# Patient Record
Sex: Female | Born: 1935
Health system: Southern US, Community
[De-identification: ages and names within clinical notes are randomized; demographics above are authoritative.]

## PROBLEM LIST (undated history)

## (undated) DIAGNOSIS — I5032 Chronic diastolic (congestive) heart failure: Secondary | ICD-10-CM

## (undated) DIAGNOSIS — E876 Hypokalemia: Secondary | ICD-10-CM

## (undated) DIAGNOSIS — I4892 Unspecified atrial flutter: Principal | ICD-10-CM

## (undated) DIAGNOSIS — H269 Unspecified cataract: Secondary | ICD-10-CM

## (undated) DIAGNOSIS — I351 Nonrheumatic aortic (valve) insufficiency: Secondary | ICD-10-CM

## (undated) DIAGNOSIS — E039 Hypothyroidism, unspecified: Secondary | ICD-10-CM

## (undated) DIAGNOSIS — E7800 Pure hypercholesterolemia, unspecified: Secondary | ICD-10-CM

## (undated) DIAGNOSIS — C801 Malignant (primary) neoplasm, unspecified: Secondary | ICD-10-CM

## (undated) DIAGNOSIS — U071 COVID-19: Secondary | ICD-10-CM

## (undated) DIAGNOSIS — I4891 Unspecified atrial fibrillation: Secondary | ICD-10-CM

## (undated) DIAGNOSIS — M25562 Pain in left knee: Secondary | ICD-10-CM

## (undated) DIAGNOSIS — K449 Diaphragmatic hernia without obstruction or gangrene: Secondary | ICD-10-CM

## (undated) DIAGNOSIS — I491 Atrial premature depolarization: Secondary | ICD-10-CM

## (undated) DIAGNOSIS — I493 Ventricular premature depolarization: Secondary | ICD-10-CM

## (undated) DIAGNOSIS — F419 Anxiety disorder, unspecified: Secondary | ICD-10-CM

## (undated) DIAGNOSIS — J069 Acute upper respiratory infection, unspecified: Secondary | ICD-10-CM

## (undated) DIAGNOSIS — IMO0001 Reserved for inherently not codable concepts without codable children: Secondary | ICD-10-CM

## (undated) DIAGNOSIS — E669 Obesity, unspecified: Secondary | ICD-10-CM

## (undated) DIAGNOSIS — I10 Essential (primary) hypertension: Secondary | ICD-10-CM

## (undated) DIAGNOSIS — E559 Vitamin D deficiency, unspecified: Secondary | ICD-10-CM

## (undated) DIAGNOSIS — K219 Gastro-esophageal reflux disease without esophagitis: Secondary | ICD-10-CM

## (undated) DIAGNOSIS — N183 Chronic kidney disease, stage 3 unspecified: Secondary | ICD-10-CM

## (undated) DIAGNOSIS — M810 Age-related osteoporosis without current pathological fracture: Secondary | ICD-10-CM

## (undated) DIAGNOSIS — R609 Edema, unspecified: Secondary | ICD-10-CM

## (undated) DIAGNOSIS — E119 Type 2 diabetes mellitus without complications: Secondary | ICD-10-CM

## (undated) HISTORY — PX: COLONOSCOPY: SHX174

## (undated) HISTORY — DX: Nonrheumatic aortic (valve) insufficiency: I35.1

## (undated) HISTORY — PX: CATARACT EXTRACTION, BILATERAL: SHX1313

## (undated) HISTORY — DX: Hypokalemia: E87.6

## (undated) HISTORY — DX: Unspecified atrial flutter: I48.92

## (undated) HISTORY — DX: Acute upper respiratory infection, unspecified: J06.9

## (undated) HISTORY — DX: Unspecified atrial fibrillation: I48.91

## (undated) HISTORY — DX: Pure hypercholesterolemia, unspecified: E78.00

## (undated) HISTORY — DX: Hypothyroidism, unspecified: E03.9

## (undated) HISTORY — DX: Atrial premature depolarization: I49.1

## (undated) HISTORY — DX: Essential (primary) hypertension: I10

## (undated) HISTORY — DX: Gastro-esophageal reflux disease without esophagitis: K21.9

## (undated) HISTORY — PX: ABDOMINAL HYSTERECTOMY: SHX81

## (undated) HISTORY — DX: Chronic kidney disease, stage 3 unspecified: N18.30

## (undated) HISTORY — DX: Malignant (primary) neoplasm, unspecified: C80.1

## (undated) HISTORY — DX: Reserved for inherently not codable concepts without codable children: IMO0001

## (undated) HISTORY — DX: Anxiety disorder, unspecified: F41.9

## (undated) HISTORY — DX: Vitamin D deficiency, unspecified: E55.9

## (undated) HISTORY — DX: Pain in left knee: M25.562

## (undated) HISTORY — PX: UPPER GASTROINTESTINAL ENDOSCOPY: SHX188

## (undated) HISTORY — DX: Chronic diastolic (congestive) heart failure: I50.32

## (undated) HISTORY — DX: COVID-19: U07.1

## (undated) HISTORY — DX: Ventricular premature depolarization: I49.3

## (undated) HISTORY — DX: Obesity, unspecified: E66.9

## (undated) HISTORY — DX: Age-related osteoporosis without current pathological fracture: M81.0

## (undated) HISTORY — DX: Unspecified cataract: H26.9

## (undated) HISTORY — DX: Chronic kidney disease, stage 3 (moderate): N18.3

## (undated) HISTORY — DX: Diaphragmatic hernia without obstruction or gangrene: K44.9

## (undated) HISTORY — PX: BACK SURGERY: SHX140

## (undated) HISTORY — DX: Edema, unspecified: R60.9

## (undated) HISTORY — PX: TONSILLECTOMY: SUR1361

## (undated) HISTORY — DX: Type 2 diabetes mellitus without complications: E11.9

---

## 1978-08-18 HISTORY — PX: APPENDECTOMY: SHX54

## 1978-08-18 HISTORY — PX: TOTAL ABDOMINAL HYSTERECTOMY W/ BILATERAL SALPINGOOPHORECTOMY: SHX83

## 1998-12-17 HISTORY — PX: CHOLECYSTECTOMY: SHX55

## 2001-06-01 ENCOUNTER — Other Ambulatory Visit: Admission: RE | Admit: 2001-06-01 | Discharge: 2001-06-01 | Payer: Self-pay | Admitting: Family Medicine

## 2001-11-24 ENCOUNTER — Encounter: Payer: Self-pay | Admitting: Family Medicine

## 2001-11-24 ENCOUNTER — Ambulatory Visit (HOSPITAL_COMMUNITY): Admission: RE | Admit: 2001-11-24 | Discharge: 2001-11-24 | Payer: Self-pay | Admitting: Family Medicine

## 2002-03-24 ENCOUNTER — Ambulatory Visit (HOSPITAL_COMMUNITY): Admission: RE | Admit: 2002-03-24 | Discharge: 2002-03-24 | Payer: Self-pay

## 2002-07-12 ENCOUNTER — Other Ambulatory Visit: Admission: RE | Admit: 2002-07-12 | Discharge: 2002-08-06 | Payer: Self-pay | Admitting: Family Medicine

## 2002-07-20 ENCOUNTER — Ambulatory Visit (HOSPITAL_COMMUNITY): Admission: RE | Admit: 2002-07-20 | Discharge: 2002-07-20 | Payer: Self-pay | Admitting: Family Medicine

## 2002-07-20 ENCOUNTER — Encounter: Payer: Self-pay | Admitting: Family Medicine

## 2003-08-15 ENCOUNTER — Other Ambulatory Visit: Admission: RE | Admit: 2003-08-15 | Discharge: 2003-08-15 | Payer: Self-pay | Admitting: Family Medicine

## 2004-10-02 ENCOUNTER — Observation Stay (HOSPITAL_COMMUNITY): Admission: AD | Admit: 2004-10-02 | Discharge: 2004-10-03 | Payer: Self-pay | Admitting: Internal Medicine

## 2004-11-12 ENCOUNTER — Other Ambulatory Visit: Admission: RE | Admit: 2004-11-12 | Discharge: 2004-11-12 | Payer: Self-pay | Admitting: *Deleted

## 2005-06-06 ENCOUNTER — Ambulatory Visit (HOSPITAL_COMMUNITY): Admission: RE | Admit: 2005-06-06 | Discharge: 2005-06-06 | Payer: Self-pay | Admitting: Family Medicine

## 2005-09-17 ENCOUNTER — Ambulatory Visit: Payer: Self-pay | Admitting: Internal Medicine

## 2005-09-24 ENCOUNTER — Ambulatory Visit: Payer: Self-pay | Admitting: Internal Medicine

## 2005-10-09 ENCOUNTER — Encounter: Admission: RE | Admit: 2005-10-09 | Discharge: 2005-10-09 | Payer: Self-pay | Admitting: Specialist

## 2005-10-23 ENCOUNTER — Ambulatory Visit: Payer: Self-pay | Admitting: Internal Medicine

## 2006-01-22 ENCOUNTER — Ambulatory Visit (HOSPITAL_COMMUNITY): Admission: RE | Admit: 2006-01-22 | Discharge: 2006-01-26 | Payer: Self-pay | Admitting: Specialist

## 2009-03-20 ENCOUNTER — Encounter: Admission: RE | Admit: 2009-03-20 | Discharge: 2009-03-20 | Payer: Self-pay | Admitting: Family Medicine

## 2010-04-20 ENCOUNTER — Emergency Department (HOSPITAL_COMMUNITY): Admission: EM | Admit: 2010-04-20 | Discharge: 2010-04-21 | Payer: Self-pay | Admitting: Emergency Medicine

## 2010-04-23 ENCOUNTER — Encounter: Payer: Self-pay | Admitting: Internal Medicine

## 2010-04-23 ENCOUNTER — Telehealth: Payer: Self-pay | Admitting: Internal Medicine

## 2010-09-08 ENCOUNTER — Encounter: Payer: Self-pay | Admitting: Specialist

## 2010-09-17 NOTE — Letter (Signed)
Summary: New Patient letter  [facility]  [address] [place], [place] [address]   Phone: [phone]  Fax: [phone]       04/23/2010 MRN: [id number]  Katie [name] [address] [place], [place]  [address]  Dear Ms. Centner,  Welcome to the Gastroenterology Division at [facility].    You are scheduled to see Dr.  Leone Payor  on 05/17/10  at 9:30 a.m.  on the 3rd floor at [facility], [address]  We ask that you try to arrive at our office 15 minutes prior to your appointment time to allow for check-in.  We would like you to complete the enclosed self-administered evaluation form prior to your visit and bring it with you on the day of your appointment.  We will review it with you.  Also, please bring a complete list of all your medications or, if you prefer, bring the medication bottles and we will list them.  Please bring your insurance card so that we may make a copy of it.  If your insurance requires a referral to see a specialist, please bring your referral form from your primary care physician.  Co-payments are due at the time of your visit and may be paid by cash, check or credit card.     Your office visit will consist of a consult with your physician (includes a physical exam), any laboratory testing he/she may order, scheduling of any necessary diagnostic testing (e.g. x-ray, ultrasound, CT-scan), and scheduling of a procedure (e.g. Endoscopy, Colonoscopy) if required.  Please allow enough time on your schedule to allow for any/all of these possibilities.    If you cannot keep your appointment, please call [phone] to cancel or reschedule prior to your appointment date.  This allows [place] the opportunity to schedule an appointment for another patient in need of care.  If you do not cancel or reschedule by 5 p.m. the business day prior to your appointment date, you will be charged a $50.00 late cancellation/no-show fee.    Thank you for choosing  [facility] for your medical needs.  We appreciate the opportunity to care for you.  Please visit [place] at our website  to learn more about our practice.                     Sincerely,                                                             The Gastroenterology Division

## 2010-09-17 NOTE — Progress Notes (Signed)
Summary: triage   Phone Note Call from Patient Call back at Home Phone [phone]   Caller: Patient Call For: Dr Leone Payor Reason for Call: Talk to Nurse Summary of Call: Patient states that she has blood in her stool and was seen in the ED this weekend and was told to see her gi but the first available is on 10-24 and she wants to be seen sooner than that. Initial call taken by: Tawni Levy,  April 23, 2010 1:40 PM  Follow-up for Phone Call        Patient  was seen in the ER at [facility] on Saturday for vomiting.  She reports she also had some rectal bleeding and was advised to follow up with GI.  Patient  reports minmal bleeding x 1 today on the tissue.  She is given and appointment with Dr Leone Payor for 05/17/10 9:30.  She will call back for an increase in bleeding or other symptoms. Follow-up by: Darcey Nora RN, CGRN,  April 23, 2010 3:06 PM

## 2010-10-31 LAB — CBC
MCH: 32.8 pg (ref 26.0–34.0)
RDW: 12.5 % (ref 11.5–15.5)
WBC: 12.3 10*3/uL — ABNORMAL HIGH (ref 4.0–10.5)

## 2010-10-31 LAB — COMPREHENSIVE METABOLIC PANEL
Alkaline Phosphatase: 88 U/L (ref 39–117)
BUN: 17 mg/dL (ref 6–23)
GFR calc non Af Amer: 46 mL/min — ABNORMAL LOW (ref >60)
Glucose, Bld: 188 mg/dL — ABNORMAL HIGH (ref 70–99)
Sodium: 137 mEq/L (ref 135–145)
Total Protein: 7.2 g/dL (ref 6.0–8.3)

## 2010-10-31 LAB — DIFFERENTIAL
Basophils Absolute: 0 10*3/uL (ref 0.0–0.1)
Eosinophils Absolute: 0 10*3/uL (ref 0.0–0.7)
Eosinophils Relative: 0 % (ref 0–5)
Lymphs Abs: 1.6 10*3/uL (ref 0.7–4.0)
Neutrophils Relative %: 84 % — ABNORMAL HIGH (ref 43–77)

## 2010-10-31 LAB — TYPE AND SCREEN

## 2011-01-03 NOTE — H&P (Signed)
Katie [name], Katie [name]                 ACCOUNT NO.:  [id number]   MEDICAL RECORD NO.:  [id number]          PATIENT TYPE:  INP   LOCATION:  A225                          FACILITY:  [facility]   PHYSICIAN:  Vania Rea, M.D. DATE OF BIRTH:  Aug 05, 1932   DATE OF ADMISSION:  10/02/2004  DATE OF DISCHARGE:  LH                                HISTORY & PHYSICAL   PRIMARY CARE PHYSICIAN:  Dr. Rudi Heap.   CHIEF COMPLAINT:  Cough, sore throat, and generalized pain for the past 10  days.   HISTORY OF PRESENT ILLNESS:  This is a 77 year old Caucasian female with a  history of hypothyroidism, hypertension, GERD, hyperlipidemia, who was in  her baseline state of health until 10 days ago when she developed what  seemed like an upper respiratory problem, but has not been responding to  symptomatic treatments at her primary's office.  Yesterday, she was started  on Avelox but has not been able to tolerate it because of vomiting.  She  visited her doctor's office again yesterday because of nause and vomiting.  Blood work was repeated, and she was sent for a chest x-ray.  Blood work  revealed that she was severely hypokalemic with a serum potassium of 2.9,  and the chest x-ray reportedly revealed no evidence of acute infiltrate;  however, there is evidence of peribronchial thickening.  Physician called  the hospitalist service and requested elective admission for this patient,  who could not be managed as an outpatient.   ROS significant only for headaches, nausea and vomiting, generalized body  pain and aches, persistent coughing, soreness, sore throat, generalized  weakness. She insists she received the infloenza vaccine in October 2005,  although the is not in her records.   PAST MEDICAL HISTORY:  1.  Hypertension.  2.  Hypercholesterolemia.  3.  GERD.  4.  Obesity.  5.  Hypothyroidism.   MEDICATIONS:  1.  Zestoretic 20/12.5.  2.  Lipitor 5 mg daily.  3.  Synthroid 50 mcg daily.  4.   Tramadol 50-100 mg every 6 hours p.r.n.  5.  Nabumetone 500 mg daily p.r.n. pain.  6.  Premarin 6.25 mg daily.  7.  Prevacid 30 mg p.r.n. when she eats the wrong thing.   PAST SURGICAL HISTORY:  1.  Hysterectomy and right salpingo-oophorectomy in 1980.  2.  Appendectomy in 1980.  3.  Tonsillectomy in the remote past.  4.  Cholecystectomy in May, 2000.   ALLERGIES:  PENICILLIN causes a rash.   SOCIAL HISTORY:  Lives at home with her husband of 51 years.  Works as a  Holiday representative at [employer].  She has two grown children.  There is no history of  tobacco, alcohol, or illegal drug use.   FAMILY HISTORY:  Significant for a father who died at age 23 and mother who  died at age 35.  Father suffered with heart disease throughout most of his  life.  Mother died of a stroke.  Her siblings were plagued with  hypercholesterolemia and hypertension.  Her son has diabetes, which seems to  have been  inherited from his father.   REVIEW OF SYSTEMS:  Significant only for headaches, nausea and vomiting,  generalized body pain and aches, persistent coughing, soreness, sore throat,  generalized weakness.  The rest of the 10-point review of systems is  completely negative.   PHYSICAL EXAMINATION:  VITAL SIGNS:  Temperature 101.2, respirations 20,  blood pressure 127/66, pulse 86.  GENERAL:  This is an ill-looking, young-for-age elderly lady lying in bed.  HEENT:  Pupils are equal, round and reactive.  She is sniffling.  She seems  to have a scant nasal discharge.  Her mucous membranes are markedly dry.  Throat:  There is no exudate.  No erythema.  NECK:  She has no  lymphadenopathy.  No jugular venous distention.  CHEST:  Clear to auscultation bilaterally.  CARDIOVASCULAR:  Regular rhythm.  ABDOMEN:  Soft and nontender.  EXTREMITIES:  Without edema.  She has 3+ bounding pulses.   Her labs done yesterday are significant for hemoglobin of 13.5, white count  9.2, platelet count 249.  Absolute granulocyte  count was elevated at 8.  Her  serum chemistry was significant for a potassium of 2.9, BUN 15, creatinine  0.9.  Her sodium was 137.  Her liver function tests were unremarkable.  Her  total cholesterol was 207.  Her glucose was 110.   ASSESSMENT:  1.  Acute viral syndrome.  2.  Severe hypokalemia.  3.  Dehydration.  4.  Hypertension, controlled.  5.  History of gastroesophageal reflux disease.  6.  History of hypercholesterolemia.  7.  History of hypothyroidism.   PLAN:  We will hydrate this lady overnight and keep her n.p.o.  Start her on  clear liquids in the morning with plan to discharge her ASAP.  will do nasal swabs for influenza antigen; will do blood cultures.      LC/MEDQ  D:  10/02/2004  T:  10/02/2004  Job:  [id number]

## 2011-01-03 NOTE — Op Note (Signed)
NAMEJANAYSIA, MCLEROY                 ACCOUNT NO.:  [id number]   MEDICAL RECORD NO.:  [id number]          PATIENT TYPE:  OIB   LOCATION:  5033                         FACILITY:  [facility]   PHYSICIAN:  Kerrin Champagne, M.D.   DATE OF BIRTH:  February 14, 1932   DATE OF PROCEDURE:  01/22/2006  DATE OF DISCHARGE:                                 OPERATIVE REPORT   PREOPERATIVE DIAGNOSIS:  Lateral recess stenosis, bilateral L4-5.   POSTOPERATIVE DIAGNOSIS:  Lateral recess stenosis, bilateral L4-5.  The  patient also has degenerative disk disease, L5-S1.   OPERATION PERFORMED:  Bilateral L4-5 lateral recess decompression utilizing  the operating microscope.   SURGEON:  Kerrin Champagne, M.D.   ASSISTANT:  Wende Neighbors, P.A.   ANESTHESIA:  General orotracheal anesthesia, Dr. Diamantina Monks.   SPECIMENS:  None.   ESTIMATED BLOOD LOSS:  50 mL.   COMPLICATIONS:  None.   The patient returned to the PACU in good condition.   HISTORY OF PRESENT ILLNESS:  The patient is a 77 year old female with a  chief complaint of back pain with radiation into both legs.  The pain is  worsening as time goes by.  She has had previous epidural steroid injections  with relief of discomfort but only temporizing her pain.  She presents with  increasing symptoms of neurogenic claudication and difficulty ambulating  even 20 yards without having to find a place to sit.  She underwent  preoperative studies, myelogram which demonstrated bilateral lateral recess  stenosis at the L4-5 level, degenerative disk changes, L5-S1.  As the  patient is having significant clinical symptoms of stenosis, it is felt that  the lateral recess stenosis is the primary source of her pain and  discomfort.  She was brought to the operating room to undergo bilateral  lateral recess decompression of the L4-5 level.   INTRAOPERATIVE FINDINGS:  Bilateral lateral recess stenosis with compression  on the L5 nerve roots bilaterally affecting the 5  nerve root at its entry  point into the neural foramen along the lateral recess at the L4-5 segment.   DESCRIPTION OF PROCEDURE:  After adequate general anesthesia, the patient  knee chest position, Andrews frame, standard preoperative antibiotics with  vancomycin, her ALLERGY TO PENICILLIN.  Standard prep with Duraprep  solution.  All pressure points well padded. The patient was draped in the  usual manner.  Initial needle was placed at the expected L4 and L5 level and  the lower needle noted to be at the upper end of L5.  The upper needle at  the upper portion of L4.   The patient then had infiltration with Marcaine 0.5% with 1:200,000  epinephrine.  She had a Vi-drape then placed.  An incision made in the  midline approximately 2-1/2 inches in length through the skin and  subcutaneous layers, carried down to the patient's spinous process.  Incision made on both sides of the spinous process of L4 and L5.  Cobbs used  to elevate the paralumbar muscles both sides, exposing the L4-5 both sides.  McCullough retractor was inserted with the  blade beneath the medial border  of the longus colli muscle both sides.  Leksell rongeur then used to  carefully remove a small portion of the inferior aspect of the lamina  bilaterally at the L4 level and then  high speed bur used to remove further  bone thinning the posterior aspect of the lamina of L4 along the left side  and right side, performing semihemilaminectomies also removing a small  portion of the inferior aspect of the inferior articular process of L4 both  sides about 15 to 20%.  This was done under loupe magnification with head  lamp.  Irrigation performed using high speed bur.  The operating room  microscope draped and brought into the field sterilely, first the left side  was decompressed by removing the deeper portions of lamina of L4, removing a  small portion of the inferior lamina up to the area of insertion of the  ligamentum  flavum.  Then excising the medial 20% of the facet of L4  inferiorly medially.  Decompressing this area.  Similarly this was done on  the right side.  The ligamentum flavum was then debrided off the medial  aspect of the facet.  The superior portion of the L5 lamina off of the  ventral aspect of the L4 lamina of both sides.  All the reflected portions  of the ligamentum flavum were resected and the medial portion of the  superior articular process of L5 was then resected over about 15 to 20%  bilaterally decompressing the lateral recess and both L5 nerve roots.  Foraminotomy was then performed over the L5 nerve root using 3 mm and 2 mm  Kerrisons.  This completed, then a hockey stick nerve probe could be passed  out both L5 neural foramina without difficulty.  The medial aspect of the  pedicle was easily palpated using hockey stitch nerve probe.  Lateral  recesses were well decompressed.  Foramen for the L4 nerve root felt to be  completely open, both sides.  Bone wax was applied to the bleeding  cancellous bone surfaces on the medial aspect of the facetectomy, both  sides.  Partial facetectomies.  Excess bone wax was removed.  Thrombin  soaked Gelfoam was applied.  This was then removed at the end of the case  leaving no material within the spinal canal.  Bleeding was completely  stopped at this point.  Irrigation was performed.  Soft tissues allowed to  fall back into place.  Lumbodorsal fascia then approximated in midline with  interrupted #1 Vicryl sutures, deep subcutaneous layers approximated with  interrupted #1  and 0 Vicryl sutures, more superficial layers interrupted 2-  0 Vicryl sutures.  Skin closed with a running subcutaneous stitch of 4-0  Vicryl.  Dermabond was then applied.  4 x 4s fixed to the skin with Hypafix  tape.  The patient then returned to a supine position, reactivated, extubated and returned to recovery room in satisfactory condition.  All  instrument and sponge  counts were correct.   NECESSITY FOR AN ASSISTANT DURING THIS CASE:  The assistant was used during  this case for intraoperative retraction of neural elements, suctioning about  the neural elements on the posterior aspect of the laminectomy site, a very  delicate portion of the procedure, necessitating professional skill.  At the  end of the procedure then, the assistant assisted in closing the operative  wound.      Kerrin Champagne, M.D.  Electronically Signed  JEN/MEDQ  D:  01/22/2006  T:  01/23/2006  Job:  [id number]

## 2011-01-03 NOTE — Discharge Summary (Signed)
[name], [name]                 ACCOUNT NO.:  [id number]   MEDICAL RECORD NO.:  [id number]          PATIENT TYPE:  INP   LOCATION:  A225                          FACILITY:  [facility]   PHYSICIAN:  Vania Rea, M.D. DATE OF BIRTH:  11/03/1931   DATE OF ADMISSION:  10/02/2004  DATE OF DISCHARGE:  02/16/2006LH                                 DISCHARGE SUMMARY   PRIMARY CARE PHYSICIAN:  Ernestina Penna, M.D.   DISCHARGE DIAGNOSES:  1.  Acute viral syndrome, improved.  2.  Nausea and vomiting related to #1.  3.  Severe hypokalemia, resolved.  4.  Generalized weakness and body aches related to #1.  5.  Hypothyroidism, stable.  6.  History of gastroesophageal reflux disease.  7.  History of hyperlipidemia.   DISPOSITION:  Discharged to home.   CONDITION ON DISCHARGE:  Stable.   DISCHARGE MEDICATIONS:  1.  Synthroid 50 mcg daily.  2.  Lipitor 10 mg each evening.  3.  Premarin 0.625 mg daily.  4.  Prevacid 30 mg twice daily.  5.  Phenergan 20 mg three times daily when necessary.  6.  Tylenol 650 mg q.4h.  7.  K-Dur 20 mEq daily.  8.  Zestoretic is 20/12.5, daily, restart in three days time on Monday.   HOSPITAL COURSE:  Please refer to admission history and physical dictated  yesterday.  This is a 77 year old Caucasian lady directly admitted from her  doctor's office with a history of multiple visits to doctor's office with  what appeared to be an upper respiratory infection with a chest x-ray which  had reportedly been done at our facility showing acute bronchitis.  The  patient also, by blood work, had been found to have potassium of 2.9.  The  patient was admitted for correction of her potassium.  However, chest x-ray  could not be located.  A repeat chest x-ray done this morning was completely  clear.  No evidence of bronchitis or acute disease.  The patient's physical  exam, apart from dehydration was unremarkable.  The patient was hydrated  vigorously overnight with  potassium-containing fluid.  She was kept NPO.  This morning she was started on clear liquids and has been able to tolerate  a more solid lunch.  There is some mild nausea persisting but no vomiting.  The patient's potassium was still a little low this morning and she was  given additional oral doses of potassium.  This morning the patient's  physical examination is unremarkable.   PHYSICAL EXAMINATION:  VITAL SIGNS: Her vital signs showed temperature  100.2, respirations 18, pulse 79, blood pressure 125/66. She is saturating  at 96% on room air.  CHEST:  Clear to auscultation bilaterally.  CARDIOVASCULAR:  Regular.  ABDOMEN:  Soft, nontender.  EXTREMITIES:  Without edema.   LABORATORY DATA:  White count 5.7, hemoglobin 11.8, platelets 228.  She had  67% neutrophils, 23% lymphocytes. Sodium 139, potassium 3.1, chloride 102,  CO2 30, glucose 117, BUN 9, creatinine 1, calcium 8.3.   FOLLOW UP:  The patient is to follow up with her primary  care physician.  The patient has been advised to stay away from work until at least February  27.  She has been advised to drink plenty of soups and juices and not resume  her Zestoretic before Monday.      LC/MEDQ  D:  10/04/2004  T:  10/04/2004  Job:  [id number]

## 2011-01-09 ENCOUNTER — Encounter: Payer: Self-pay | Admitting: Nurse Practitioner

## 2012-05-07 ENCOUNTER — Encounter: Payer: Self-pay | Admitting: Internal Medicine

## 2012-05-07 ENCOUNTER — Ambulatory Visit (INDEPENDENT_AMBULATORY_CARE_PROVIDER_SITE_OTHER): Payer: Medicare Other | Admitting: Internal Medicine

## 2012-05-07 VITALS — BP 160/80 | HR 68 | Ht 67.75 in | Wt 188.0 lb

## 2012-05-07 DIAGNOSIS — E119 Type 2 diabetes mellitus without complications: Secondary | ICD-10-CM | POA: Insufficient documentation

## 2012-05-07 DIAGNOSIS — I10 Essential (primary) hypertension: Secondary | ICD-10-CM | POA: Insufficient documentation

## 2012-05-07 DIAGNOSIS — R198 Other specified symptoms and signs involving the digestive system and abdomen: Secondary | ICD-10-CM

## 2012-05-07 DIAGNOSIS — E785 Hyperlipidemia, unspecified: Secondary | ICD-10-CM | POA: Insufficient documentation

## 2012-05-07 DIAGNOSIS — E1169 Type 2 diabetes mellitus with other specified complication: Secondary | ICD-10-CM | POA: Insufficient documentation

## 2012-05-07 DIAGNOSIS — K5900 Constipation, unspecified: Secondary | ICD-10-CM

## 2012-05-07 DIAGNOSIS — K219 Gastro-esophageal reflux disease without esophagitis: Secondary | ICD-10-CM | POA: Insufficient documentation

## 2012-05-07 DIAGNOSIS — R194 Change in bowel habit: Secondary | ICD-10-CM

## 2012-05-07 MED ORDER — MOVIPREP 100 G PO SOLR: 1.0000 | Freq: Once | ORAL | Status: DC

## 2012-05-07 NOTE — Addendum Note (Signed)
Addended by: Jeanine Luz on: 05/07/2012 10:59 AM   Modules accepted: Orders

## 2012-05-07 NOTE — Progress Notes (Signed)
  Subjective:    Patient ID: Katie [name], female    DOB: 06-Aug-1932, 78 y.o.   MRN: [id number]  HPI This is a very pleasant married 78 year old white woman known from prior colonoscopy in 2007, it showed diverticulosis and mixed hemorrhoids. Over the past year she has had worsening constipation and has noted progressively smaller caliber of stools. She has not noted rectal bleeding she has some mild lower abdominal discomfort with this. She has tried stool softener and some over-the-counter laxative tablets by [employer] with minimal relief. She does not move her bowels except every few days, and does have the caliber change. She is concerned because her sister has ovarian and colon cancer, presumably metastatic ovarian cancer, another as endometrial cancer. They both had abdominal complaints prior to their diagnosis. The patient takes a PPI for heartburn and GERD with good results. She previously had an EGD in 2007 that was normal.  Medications, allergies, past medical history, past surgical history, family history and social history are reviewed and updated in the EMR.   Review of Systems This is positive for some urinary frequency and low back pain, evaluated yesterday at the family medicine office, a negative urinalysis at that time. She has some joint pains. He recently had a Pap smear. He did have a ring culture with mixed flora present in August. All other review of systems are negative except as per history of present illness at this point.    Objective:   Physical Exam General:  Well-developed, well-nourished and in no acute distress Eyes:  anicteric. ENT:   Mouth and posterior pharynx free of lesions.  Neck:   supple w/o thyromegaly or mass.  Lungs: Clear to auscultation bilaterally. Heart:  S1S2, no rubs, murmurs, gallops. Abdomen:  soft, non-tender, no hepatosplenomegaly, hernia, or mass and BS+.  Rectal: Deferred until colonoscopy Lymph:  no cervical or supraclavicular  adenopathy. Extremities:   no edema Skin   no rash. Neuro:  A&O x 3.  Psych:  appropriate mood and  Affect.   Data Reviewed: 04/01/2012 metabolic panel showing creatinine slightly elevated at 1.14 and glucose 141. TSH was slightly elevated at 4.613.     Assessment & Plan:   1. Change in bowel habits   2. Constipation    1. Will schedule colonoscopy to evaluate a change in bowel habits. 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 The risks and benefits as well as alternatives of endoscopic procedure(s) have been discussed and reviewed. All questions answered. The patient agrees to proceed. She will start MiraLax on a daily basis to help with constipation Or other plans pending these results.   I appreciate the opportunity to care for this patient.  CC: Rudi Heap, MD

## 2012-05-07 NOTE — Patient Instructions (Addendum)
You have been scheduled for a colonoscopy with propofol. Please follow written instructions given to you at your visit today.  Please pick up your prep kit at the pharmacy within the next 1-3 days. If you use inhalers (even only as needed), please bring them with you on the day of your procedure.  You may take a dose of Miralax daily for constipation

## 2012-05-10 ENCOUNTER — Encounter: Payer: Self-pay | Admitting: Internal Medicine

## 2012-05-11 ENCOUNTER — Encounter: Payer: Self-pay | Admitting: Internal Medicine

## 2012-05-28 ENCOUNTER — Encounter: Payer: Self-pay | Admitting: Internal Medicine

## 2012-05-28 ENCOUNTER — Ambulatory Visit (AMBULATORY_SURGERY_CENTER): Payer: Medicare Other | Admitting: Internal Medicine

## 2012-05-28 VITALS — BP 148/76 | HR 61 | Temp 98.0°F | Resp 15 | Ht 68.0 in | Wt 188.0 lb

## 2012-05-28 DIAGNOSIS — K5730 Diverticulosis of large intestine without perforation or abscess without bleeding: Secondary | ICD-10-CM

## 2012-05-28 DIAGNOSIS — K648 Other hemorrhoids: Secondary | ICD-10-CM

## 2012-05-28 DIAGNOSIS — D126 Benign neoplasm of colon, unspecified: Secondary | ICD-10-CM

## 2012-05-28 DIAGNOSIS — K5900 Constipation, unspecified: Secondary | ICD-10-CM

## 2012-05-28 DIAGNOSIS — R198 Other specified symptoms and signs involving the digestive system and abdomen: Secondary | ICD-10-CM

## 2012-05-28 MED ORDER — SODIUM CHLORIDE 0.9 % IV SOLN: 500.0000 mL | INTRAVENOUS | Status: DC

## 2012-05-28 NOTE — Op Note (Signed)
[facility] [address] [place], [address]   COLONOSCOPY PROCEDURE REPORT  PATIENT: Katie [name], Katie [name]  MR#: [id number] BIRTHDATE: 1931/09/10 , 80  yrs. old GENDER: Female ENDOSCOPIST: Iva Boop, MD, [facility] [place] REFERRED BY: PROCEDURE DATE:  05/28/2012 PROCEDURE:   Colonoscopy with snare polypectomy ASA CLASS:   Class II INDICATIONS:change in bowel habits. MEDICATIONS: propofol (Diprivan) 150mg  IV, MAC sedation, administered by CRNA, and These medications were titrated to patient response per physician's verbal order  DESCRIPTION OF PROCEDURE:   After the risks benefits and alternatives of the procedure were thoroughly explained, informed consent was obtained.  A digital rectal exam revealed no abnormalities of the rectum.   The LB CF-[id number]  endoscope was introduced through the anus and advanced to the cecum, which was identified by both the appendix and ileocecal valve. No adverse events experienced.   The quality of the prep was Suprep excellent The instrument was then slowly withdrawn as the colon was fully examined.      COLON FINDINGS: Two diminutive polypoid shaped sessile polyps were found at the cecum.  A polypectomy was performed with a cold snare. The resection was complete and the polyp tissue was completely retrieved.   Moderate diverticulosis was noted in the sigmoid colon.   Moderate sized internal hemorrhoids were found.   The colon mucosa was otherwise normal.  Retroflexed views revealed internal hemorrhoids. The time to cecum=3 minutes 11 seconds. Withdrawal time=10 minutes 30 seconds.  The scope was withdrawn and the procedure completed. COMPLICATIONS: There were no complications.  ENDOSCOPIC IMPRESSION: 1.   Two diminutive sessile polyps were found at the cecum; polypectomy was performed with a cold snare 2.   Moderate diverticulosis was noted in the sigmoid colon 3.   Moderate sized internal hemorrhoids 4.   The colon mucosa  was otherwise normal with excellent prep  RECOMMENDATIONS: Continue MiraLax  eSigned:  Iva Boop, MD, [facility] 05/28/2012 2:10 PM  cc: Rudi Heap, MD and The Patient

## 2012-05-28 NOTE — Patient Instructions (Addendum)
Two very small polyps were removed. You have diverticulosis which likely caused the change in bowels. Hemorrhoids also seen.  Please continue the MiraLax and see me as needed.  Thank you for choosing me and [facility].  Iva Boop, MD, FACG   YOU HAD AN ENDOSCOPIC PROCEDURE TODAY AT THE  ENDOSCOPY CENTER: Refer to the procedure report that was given to you for any specific questions about what was found during the examination.  If the procedure report does not answer your questions, please call your gastroenterologist to clarify.  If you requested that your care partner not be given the details of your procedure findings, then the procedure report has been included in a sealed envelope for you to review at your convenience later.  YOU SHOULD EXPECT: Some feelings of bloating in the abdomen. Passage of more gas than usual.  Walking can help get rid of the air that was put into your GI tract during the procedure and reduce the bloating. If you had a lower endoscopy (such as a colonoscopy or flexible sigmoidoscopy) you may notice spotting of blood in your stool or on the toilet paper. If you underwent a bowel prep for your procedure, then you may not have a normal bowel movement for a few days.  DIET: Your first meal following the procedure should be a light meal and then it is ok to progress to your normal diet.  A half-sandwich or bowl of soup is an example of a good first meal.  Heavy or fried foods are harder to digest and may make you feel nauseous or bloated.  Likewise meals heavy in dairy and vegetables can cause extra gas to form and this can also increase the bloating.  Drink plenty of fluids but you should avoid alcoholic beverages for 24 hours.  ACTIVITY: Your care partner should take you home directly after the procedure.  You should plan to take it easy, moving slowly for the rest of the day.  You can resume normal activity the day after the procedure however you  should NOT DRIVE or use heavy machinery for 24 hours (because of the sedation medicines used during the test).    SYMPTOMS TO REPORT IMMEDIATELY: A gastroenterologist can be reached at any hour.  During normal business hours, 8:30 AM to 5:00 PM Monday through Friday, call [phone].  After hours and on weekends, please call the GI answering service at [phone] who will take a message and have the physician on call contact you.   Following lower endoscopy (colonoscopy or flexible sigmoidoscopy):  Excessive amounts of blood in the stool  Significant tenderness or worsening of abdominal pains  Swelling of the abdomen that is new, acute  Fever of 100F or higher  FOLLOW UP: If any biopsies were taken you will be contacted by phone or by letter within the next 1-3 weeks.  Call your gastroenterologist if you have not heard about the biopsies in 3 weeks.  Our staff will call the home number listed on your records the next business day following your procedure to check on you and address any questions or concerns that you may have at that time regarding the information given to you following your procedure. This is a courtesy call and so if there is no answer at the home number and we have not heard from you through the emergency physician on call, we will assume that you have returned to your regular daily activities without incident.  SIGNATURES/CONFIDENTIALITY: You and/or  your care partner have signed paperwork which will be entered into your electronic medical record.  These signatures attest to the fact that that the information above on your After Visit Summary has been reviewed and is understood.  Full responsibility of the confidentiality of this discharge information lies with you and/or your care-partner.    Handouts on [polyps, hemorrhoids, diverticulosis, high fiber diet

## 2012-05-28 NOTE — Progress Notes (Signed)
The pt tolerated the colonoscopy very well. Maw   

## 2012-05-28 NOTE — Progress Notes (Signed)
Patient did not experience any of the following events: a burn prior to discharge; a fall within the facility; wrong site/side/patient/procedure/implant event; or a hospital transfer or hospital admission upon discharge from the facility. (G8907) Patient did not have preoperative order for IV antibiotic SSI prophylaxis. (G8918)  

## 2012-05-31 ENCOUNTER — Telehealth: Payer: Self-pay | Admitting: *Deleted

## 2012-05-31 NOTE — Telephone Encounter (Signed)
  Follow up Call-  Call back number 05/28/2012  Post procedure Call Back phone  # [phone]  Permission to leave phone message Yes     Patient questions:  Do you have a fever, pain , or abdominal swelling? no Pain Score  0 *  Have you tolerated food without any problems? yes  Have you been able to return to your normal activities? yes  Do you have any questions about your discharge instructions: Diet   no Medications  no Follow up visit  no  Do you have questions or concerns about your Care? no  Actions: * If pain score is 4 or above: No action needed, pain <4.

## 2012-06-03 ENCOUNTER — Encounter: Payer: Self-pay | Admitting: Internal Medicine

## 2012-06-03 NOTE — Progress Notes (Signed)
Quick Note:  2 diminutive serrated adenomas No automatic recall at her age ______

## 2012-06-12 ENCOUNTER — Encounter (HOSPITAL_COMMUNITY): Payer: Self-pay | Admitting: Pharmacist

## 2012-06-18 ENCOUNTER — Other Ambulatory Visit (HOSPITAL_COMMUNITY): Payer: Medicare Other

## 2012-06-25 ENCOUNTER — Ambulatory Visit (HOSPITAL_COMMUNITY)
Admission: RE | Admit: 2012-06-25 | Payer: Medicare Other | Source: Ambulatory Visit | Admitting: Obstetrics & Gynecology

## 2012-06-25 ENCOUNTER — Encounter (HOSPITAL_COMMUNITY): Admission: RE | Payer: Self-pay | Source: Ambulatory Visit

## 2012-06-25 SURGERY — ROBOTIC ASSISTED LAPAROSCOPIC SACROCOLPOPEXY
Anesthesia: General

## 2012-11-29 ENCOUNTER — Telehealth: Payer: Self-pay | Admitting: Nurse Practitioner

## 2012-11-29 NOTE — Telephone Encounter (Signed)
Advised patient that no appointments available. Told to bring in urine specimen and we would check. Patient states that she will bring in tomorrow am

## 2012-11-30 ENCOUNTER — Other Ambulatory Visit (INDEPENDENT_AMBULATORY_CARE_PROVIDER_SITE_OTHER): Payer: Medicare Other

## 2012-11-30 DIAGNOSIS — N390 Urinary tract infection, site not specified: Secondary | ICD-10-CM

## 2012-11-30 LAB — POCT UA - MICROSCOPIC ONLY: Mucus, UA: NEGATIVE

## 2012-11-30 LAB — POCT URINALYSIS DIPSTICK: Spec Grav, UA: 1.01

## 2012-12-01 ENCOUNTER — Other Ambulatory Visit: Payer: Self-pay | Admitting: Nurse Practitioner

## 2012-12-01 ENCOUNTER — Telehealth: Payer: Self-pay | Admitting: Nurse Practitioner

## 2012-12-01 MED ORDER — CIPROFLOXACIN HCL 500 MG PO TABS: 500.0000 mg | ORAL_TABLET | Freq: Two times a day (BID) | ORAL | Status: DC

## 2012-12-01 NOTE — Telephone Encounter (Signed)
Please advise 

## 2012-12-01 NOTE — Telephone Encounter (Signed)
Patient called back questioning status. I advised that we were waiting on a response from [facility]. 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 I advised that it would probably be closer to the end of the day before she hears back but we are working on it. Patient was ok with that.

## 2012-12-14 ENCOUNTER — Encounter: Payer: Self-pay | Admitting: *Deleted

## 2013-01-04 ENCOUNTER — Ambulatory Visit (INDEPENDENT_AMBULATORY_CARE_PROVIDER_SITE_OTHER): Payer: Medicare Other | Admitting: Nurse Practitioner

## 2013-01-04 ENCOUNTER — Encounter: Payer: Self-pay | Admitting: Nurse Practitioner

## 2013-01-04 VITALS — BP 158/70 | HR 72 | Temp 97.0°F | Ht 67.0 in | Wt 192.0 lb

## 2013-01-04 DIAGNOSIS — R609 Edema, unspecified: Secondary | ICD-10-CM

## 2013-01-04 DIAGNOSIS — E039 Hypothyroidism, unspecified: Secondary | ICD-10-CM

## 2013-01-04 DIAGNOSIS — E876 Hypokalemia: Secondary | ICD-10-CM

## 2013-01-04 DIAGNOSIS — M549 Dorsalgia, unspecified: Secondary | ICD-10-CM

## 2013-01-04 DIAGNOSIS — E559 Vitamin D deficiency, unspecified: Secondary | ICD-10-CM

## 2013-01-04 DIAGNOSIS — K5900 Constipation, unspecified: Secondary | ICD-10-CM | POA: Insufficient documentation

## 2013-01-04 DIAGNOSIS — I10 Essential (primary) hypertension: Secondary | ICD-10-CM

## 2013-01-04 DIAGNOSIS — K219 Gastro-esophageal reflux disease without esophagitis: Secondary | ICD-10-CM

## 2013-01-04 DIAGNOSIS — E119 Type 2 diabetes mellitus without complications: Secondary | ICD-10-CM

## 2013-01-04 DIAGNOSIS — E785 Hyperlipidemia, unspecified: Secondary | ICD-10-CM

## 2013-01-04 LAB — COMPLETE METABOLIC PANEL WITH GFR
AST: 20 U/L (ref 0–37)
Albumin: 4.5 g/dL (ref 3.5–5.2)
BUN: 16 mg/dL (ref 6–23)
CO2: 29 mEq/L (ref 19–32)
Calcium: 9.8 mg/dL (ref 8.4–10.5)
Chloride: 101 mEq/L (ref 96–112)
GFR, Est African American: 49 mL/min — ABNORMAL LOW
GFR, Est Non African American: 42 mL/min — ABNORMAL LOW
Glucose, Bld: 175 mg/dL — ABNORMAL HIGH (ref 70–99)
Potassium: 4.2 mEq/L (ref 3.5–5.3)

## 2013-01-04 MED ORDER — LOVASTATIN 40 MG PO TABS: 40.0000 mg | ORAL_TABLET | Freq: Every day | ORAL | Status: DC

## 2013-01-04 MED ORDER — AMLODIPINE BESYLATE 10 MG PO TABS: 10.0000 mg | ORAL_TABLET | Freq: Every day | ORAL | Status: DC

## 2013-01-04 MED ORDER — TRAMADOL HCL 50 MG PO TABS: 50.0000 mg | ORAL_TABLET | Freq: Every day | ORAL | Status: DC

## 2013-01-04 MED ORDER — FUROSEMIDE 40 MG PO TABS: 40.0000 mg | ORAL_TABLET | Freq: Every day | ORAL | Status: DC

## 2013-01-04 MED ORDER — BENAZEPRIL HCL 20 MG PO TABS: 20.0000 mg | ORAL_TABLET | Freq: Every day | ORAL | Status: DC

## 2013-01-04 MED ORDER — LINACLOTIDE 145 MCG PO CAPS: 145.0000 ug | ORAL_CAPSULE | Freq: Every day | ORAL | Status: DC

## 2013-01-04 MED ORDER — LEVOTHYROXINE SODIUM 50 MCG PO TABS: 50.0000 ug | ORAL_TABLET | Freq: Every day | ORAL | Status: DC

## 2013-01-04 MED ORDER — OMEPRAZOLE 40 MG PO CPDR: 40.0000 mg | DELAYED_RELEASE_CAPSULE | Freq: Every day | ORAL | Status: DC

## 2013-01-04 MED ORDER — POTASSIUM CHLORIDE CRYS ER 20 MEQ PO TBCR: 20.0000 meq | EXTENDED_RELEASE_TABLET | Freq: Every day | ORAL | Status: DC

## 2013-01-04 NOTE — Patient Instructions (Signed)

## 2013-01-04 NOTE — Progress Notes (Signed)
Subjective:    Patient ID: Katie [name], female    DOB: 09-05-1931, 79 y.o.   MRN: [id number]  Hypertension This is a chronic problem. The current episode started more than 1 year ago. The problem is unchanged. The problem is controlled (patient didn't take her blood pressure meds this AM.). Pertinent negatives include no chest pain, headaches, malaise/fatigue, neck pain, palpitations, peripheral edema or shortness of breath. There are no associated agents to hypertension. Risk factors for coronary artery disease include dyslipidemia, obesity and post-menopausal state. Past treatments include ACE inhibitors, calcium channel blockers and diuretics. The current treatment provides significant improvement. Compliance problems include exercise and diet.  Hypertensive end-organ damage includes a thyroid problem.  Hyperlipidemia This is a chronic problem. The current episode started more than 1 year ago. The problem is controlled. Recent lipid tests were reviewed and are normal. Exacerbating diseases include diabetes, hypothyroidism and obesity. Pertinent negatives include no chest pain, leg pain, myalgias or shortness of breath. Current antihyperlipidemic treatment includes statins. The current treatment provides moderate improvement of lipids. Compliance problems include adherence to diet and adherence to exercise.  Risk factors for coronary artery disease include diabetes mellitus, hypertension, obesity and post-menopausal.  Diabetes She presents for her follow-up diabetic visit. She has type 2 diabetes mellitus. No MedicAlert identification noted. The initial diagnosis of diabetes was made 5 years ago. Her disease course has been stable. There are no hypoglycemic associated symptoms. Pertinent negatives for hypoglycemia include no headaches. There are no diabetic associated symptoms. Pertinent negatives for diabetes include no chest pain, no polydipsia, no polyphagia, no polyuria, no visual change, no  weakness and no weight loss. There are no hypoglycemic complications. Symptoms are stable. There are no diabetic complications. Risk factors for coronary artery disease include hypertension, obesity and post-menopausal. Current diabetic treatment includes diet. She is compliant with treatment most of the time. Her weight is stable. She is following a diabetic diet. When asked about meal planning, she reported none. She has not had a previous visit with a dietician. She rarely participates in exercise. There is no change in her home blood glucose trend. (Patent hasn't been checking blood sugar at home.) An ACE inhibitor/angiotensin II receptor blocker is being taken. She does not see a podiatrist.Eye exam is not current (2011- patient told needs appointment.).  Thyroid Problem Presents for follow-up visit. Symptoms include anxiety (at times). Patient reports no depressed mood, heat intolerance, leg swelling, palpitations, visual change or weight loss. The symptoms have been stable. Her past medical history is significant for diabetes and hyperlipidemia.      Review of Systems  Constitutional: Negative for weight loss and malaise/fatigue.  HENT: Negative for neck pain.   Respiratory: Negative for shortness of breath.   Cardiovascular: Negative for chest pain and palpitations.  Endocrine: Negative for heat intolerance, polydipsia, polyphagia and polyuria.  Musculoskeletal: Negative for myalgias.  Neurological: Negative for weakness and headaches.  All other systems reviewed and are negative.       Objective:   Physical Exam  Constitutional: She is oriented to person, place, and time. She appears well-developed and well-nourished.  HENT:  Nose: Nose normal.  Mouth/Throat: Oropharynx is clear and moist.  Eyes: EOM are normal.  Neck: Trachea normal, normal range of motion and full passive range of motion without pain. Neck supple. No JVD present. Carotid bruit is not present. No thyromegaly  present.  Cardiovascular: Normal rate, regular rhythm, normal heart sounds and intact distal pulses.  Exam reveals no gallop and  no friction rub.   No murmur heard. Pulmonary/Chest: Effort normal and breath sounds normal.  Abdominal: Soft. Bowel sounds are normal. She exhibits no distension and no mass. There is no tenderness.  Musculoskeletal: Normal range of motion.  Lymphadenopathy:    She has no cervical adenopathy.  Neurological: She is alert and oriented to person, place, and time. She has normal reflexes.  Skin: Skin is warm and dry.  Psychiatric: She has a normal mood and affect. Her behavior is normal. Judgment and thought content normal.   BP 158/70  Pulse 72  Temp(Src) 97 F (36.1 C) (Oral)  Ht 5\' 7"  (1.702 m)  Wt 192 lb (87.091 kg)  BMI 30.06 kg/m2 See diabetic foot exam.  Results for orders placed in visit on 01/04/13  POCT GLYCOSYLATED HEMOGLOBIN (HGB A1C)      Result Value Range   Hemoglobin A1C 6.7 %          Assessment & Plan:  1. Diabetes mellitus type 2, diet-controlled Continue diabetic low carb diet - POCT glycosylated hemoglobin (Hb A1C)  2. Hypertension Low NA+ diet  - COMPLETE METABOLIC PANEL WITH GFR - NMR Lipoprofile with Lipids - amLODipine (NORVASC) 10 MG tablet; Take 1 tablet (10 mg total) by mouth daily.  Dispense: 90 tablet; Refill: 1 - benazepril (LOTENSIN) 20 MG tablet; Take 1 tablet (20 mg total) by mouth daily.  Dispense: 90 tablet; Refill: 1  3. Hyperlipidemia Low aft diet and exercise encouraged - COMPLETE METABOLIC PANEL WITH GFR - NMR Lipoprofile with Lipids - lovastatin (MEVACOR) 40 MG tablet; Take 1 tablet (40 mg total) by mouth at bedtime. Take 2 tablets by mouth at bedtime daily  Dispense: 180 tablet; Refill: 1  4. Unspecified vitamin D deficiency - Vitamin D 25 hydroxy  5. Constipation Force fluids Increase fiber in diet - Linaclotide (LINZESS) 145 MCG CAPS; Take 1 capsule (145 mcg total) by mouth daily.  Dispense: 90  capsule; Refill: 1  6. Hypokalemia Let me know if you develop lower ext cramping - potassium chloride SA (KLOR-CON M20) 20 MEQ tablet; Take 1 tablet (20 mEq total) by mouth daily.  Dispense: 90 tablet; Refill: 1  7. Hypothyroidism - levothyroxine (SYNTHROID, LEVOTHROID) 50 MCG tablet; Take 1 tablet (50 mcg total) by mouth daily before breakfast.  Dispense: 90 tablet; Refill: 1  8. GERD (gastroesophageal reflux disease) Fatty and spicy foods can aggravate GERD - omeprazole (PRILOSEC) 40 MG capsule; Take 1 capsule (40 mg total) by mouth daily.  Dispense: 90 capsule; Refill: 1  9. Back pain - traMADol (ULTRAM) 50 MG tablet; Take 1 tablet (50 mg total) by mouth daily.  Dispense: 90 tablet; Refill: 1  10. Peripheral edema Elevate legs when sitting - furosemide (LASIX) 40 MG tablet; Take 1 tablet (40 mg total) by mouth daily.  Dispense: 90 tablet; Refill: 1  Mary-Margaret Daphine Deutscher, FNP

## 2013-01-07 ENCOUNTER — Ambulatory Visit: Payer: Self-pay | Admitting: Nurse Practitioner

## 2013-01-07 LAB — NMR LIPOPROFILE WITH LIPIDS
Cholesterol, Total: 158 mg/dL (ref <200)
LDL Particle Number: 1214 nmol/L — ABNORMAL HIGH (ref <1000)
Large VLDL-P: 5.7 nmol/L — ABNORMAL HIGH (ref <=2.7)
Small LDL Particle Number: 718 nmol/L — ABNORMAL HIGH (ref <=527)
VLDL Size: 48.1 nm — ABNORMAL HIGH (ref <=46.6)

## 2013-02-03 ENCOUNTER — Telehealth: Payer: Self-pay | Admitting: Nurse Practitioner

## 2013-02-07 NOTE — Telephone Encounter (Signed)
Correct directions

## 2013-02-07 NOTE — Telephone Encounter (Signed)
Called in the new rx for lovastatin 40mg  one daily, qty 90  With one refill.

## 2013-02-07 NOTE — Telephone Encounter (Signed)
Should be 1 PO QD

## 2013-02-08 NOTE — Telephone Encounter (Signed)
Done 02/08/13

## 2013-02-11 ENCOUNTER — Telehealth: Payer: Self-pay | Admitting: Nurse Practitioner

## 2013-02-16 NOTE — Telephone Encounter (Signed)
done

## 2013-02-28 ENCOUNTER — Telehealth: Payer: Self-pay | Admitting: Nurse Practitioner

## 2013-02-28 DIAGNOSIS — E785 Hyperlipidemia, unspecified: Secondary | ICD-10-CM

## 2013-02-28 MED ORDER — LOVASTATIN 40 MG PO TABS: ORAL_TABLET | ORAL | Status: DC

## 2013-02-28 NOTE — Telephone Encounter (Signed)
rx ready for pickup 

## 2013-02-28 NOTE — Telephone Encounter (Signed)
Patient notified

## 2013-03-09 ENCOUNTER — Ambulatory Visit (INDEPENDENT_AMBULATORY_CARE_PROVIDER_SITE_OTHER): Payer: Medicare Other | Admitting: Pharmacist

## 2013-03-09 ENCOUNTER — Ambulatory Visit (INDEPENDENT_AMBULATORY_CARE_PROVIDER_SITE_OTHER): Payer: Medicare Other

## 2013-03-09 ENCOUNTER — Encounter: Payer: Self-pay | Admitting: Pharmacist

## 2013-03-09 VITALS — Ht 67.5 in | Wt 192.0 lb

## 2013-03-09 DIAGNOSIS — M8580 Other specified disorders of bone density and structure, unspecified site: Secondary | ICD-10-CM

## 2013-03-09 DIAGNOSIS — M899 Disorder of bone, unspecified: Secondary | ICD-10-CM

## 2013-03-09 DIAGNOSIS — M949 Disorder of cartilage, unspecified: Secondary | ICD-10-CM

## 2013-03-09 DIAGNOSIS — M549 Dorsalgia, unspecified: Secondary | ICD-10-CM

## 2013-03-09 NOTE — Patient Instructions (Addendum)

## 2013-03-09 NOTE — Progress Notes (Signed)
Patient ID: Katie [name], female   DOB: 1932-05-30, 79 y.o.   MRN: [id number] [facility] Current Height: Height: 5' 7.5" (171.5 cm)      Max Lifetime Height:  5'8" Current Weight: Weight: 192 lb (87.091 kg)       Ethnicity:Caucasian        HPI: Does pt already have a diagnosis of:  Osteopenia?  No Osteoporosis?  No  Back Pain?  Yes - back surgery 2007 by Dr. Otelia Sergeant    Kyphosis?  No Prior fracture?  No Med(s) for Osteoporosis/Osteopenia:  none Med(s) previously tried for Osteoporosis/Osteopenia:  none                                                             PMH: Age at menopause:  Surgical 1980 Hysterectomy?  Yes Oophorectomy?  Yes - 1 ovary removed/1 ovary remians HRT? Yes - Former.  Type/duration: premain for several years Steroid Use?  No Thyroid med?  yes History of cancer?  No History of digestive disorders (ie Crohn's)?  Yes- takes PPI for GERD Current or previous eating disorders?  No Last Vitamin D Result:  46 (01/04/2013) Last GFR Result:  42 (01/04/2013)   FH/SH: Family history of osteoporosis?  Yes - possibly maternal grandmother Parent with history of hip fracture?  No Family history of breast cancer?  No Exercise?  Yes - 3 times weekly at [facility] Smoking?  No Alcohol?  No    Calcium Assessment Calcium Intake  # of servings/day  Calcium mg  Milk (8 oz) 0  x  300  = 0  Yogurt (4 oz) 0 x  200 = 0  Cheese (1 oz) 1/2 x  200 = 100mg   Other Calcium sources   250mg   Ca supplement 600mg  bid = 1200mg    Estimated calcium intake per day 1550mg     DEXA Results Date of Test T-Score for AP Spine L1-L4 T-Score for Total Left Hip T-Score for Total Right Hip  03/09/2013 1.1 -0.9 -0.6  01/09/2010 0.9 -0.2 -0.2  09/07/2006 1.0 0.2 0.0  07/18/2002 1.2 0.5 --   **T-Score for neck of left femur was -1.2 on 03/09/2013 **T-Score for neck for right femur was -1.1 on 03/09/2013  FRAX 10 year estimate:  Total FX risk:  12%  (consider medication if >/= 20%) Hip FX  risk:  2.3%  (consider medication if >/= 3%)  Assessment: Osteopenia with decrease in BMD  Recommendations: 1.  Discussed results of DEXA and risk of fracture.  Discussed Evista and bisphosophonates - patient declined to start medication at this time 2.  continue calcium 1200mg  daily through supplementation or diet.  3.  continue weight bearing exercise - 30 minutes at least 4 days  per week.   4.  Counseled and educated about fall risk and prevention.  Recheck DEXA:  2 years  Time spent counseling patient:  30 minutes

## 2013-04-08 ENCOUNTER — Encounter: Payer: Self-pay | Admitting: Nurse Practitioner

## 2013-04-08 ENCOUNTER — Ambulatory Visit (INDEPENDENT_AMBULATORY_CARE_PROVIDER_SITE_OTHER): Payer: Medicare Other | Admitting: Nurse Practitioner

## 2013-04-08 VITALS — BP 149/79 | HR 63 | Temp 98.2°F | Ht 67.5 in | Wt 188.5 lb

## 2013-04-08 DIAGNOSIS — IMO0001 Reserved for inherently not codable concepts without codable children: Secondary | ICD-10-CM

## 2013-04-08 DIAGNOSIS — E1165 Type 2 diabetes mellitus with hyperglycemia: Secondary | ICD-10-CM

## 2013-04-08 DIAGNOSIS — E785 Hyperlipidemia, unspecified: Secondary | ICD-10-CM

## 2013-04-08 DIAGNOSIS — IMO0002 Reserved for concepts with insufficient information to code with codable children: Secondary | ICD-10-CM

## 2013-04-08 DIAGNOSIS — I10 Essential (primary) hypertension: Secondary | ICD-10-CM

## 2013-04-08 DIAGNOSIS — E039 Hypothyroidism, unspecified: Secondary | ICD-10-CM

## 2013-04-08 LAB — POCT GLYCOSYLATED HEMOGLOBIN (HGB A1C): Hemoglobin A1C: 6.8

## 2013-04-08 NOTE — Progress Notes (Signed)
Subjective:    Patient ID: Katie [name], female    DOB: Jul 22, 1932, 79 y.o.   MRN: [id number]  Hypertension This is a chronic problem. The current episode started more than 1 year ago. The problem is unchanged. The problem is controlled (patient didn't take her blood pressure meds this AM.). Pertinent negatives include no chest pain, headaches, malaise/fatigue, neck pain, palpitations, peripheral edema or shortness of breath. There are no associated agents to hypertension. Risk factors for coronary artery disease include dyslipidemia, obesity and post-menopausal state. Past treatments include ACE inhibitors, calcium channel blockers and diuretics. The current treatment provides significant improvement. Compliance problems include exercise and diet.  Hypertensive end-organ damage includes a thyroid problem.  Hyperlipidemia This is a chronic problem. The current episode started more than 1 year ago. The problem is controlled. Recent lipid tests were reviewed and are normal. Exacerbating diseases include diabetes, hypothyroidism and obesity. Pertinent negatives include no chest pain, leg pain, myalgias or shortness of breath. Current antihyperlipidemic treatment includes statins. The current treatment provides moderate improvement of lipids. Compliance problems include adherence to diet and adherence to exercise.  Risk factors for coronary artery disease include diabetes mellitus, hypertension, obesity and post-menopausal.  Diabetes She presents for her follow-up diabetic visit. She has type 2 diabetes mellitus. No MedicAlert identification noted. The initial diagnosis of diabetes was made 5 years ago. Her disease course has been stable. There are no hypoglycemic associated symptoms. Pertinent negatives for hypoglycemia include no headaches. There are no diabetic associated symptoms. Pertinent negatives for diabetes include no chest pain, no polydipsia, no polyphagia, no polyuria, no visual change, no  weakness and no weight loss. There are no hypoglycemic complications. Symptoms are stable. There are no diabetic complications. Risk factors for coronary artery disease include hypertension, obesity and post-menopausal. Current diabetic treatment includes diet. She is compliant with treatment most of the time. Her weight is stable. She is following a diabetic diet. When asked about meal planning, she reported none. She has not had a previous visit with a dietician. She rarely participates in exercise. There is no change in her home blood glucose trend. (Patent hasn't been checking blood sugar at home.) An ACE inhibitor/angiotensin II receptor blocker is being taken. She does not see a podiatrist.Eye exam is not current (2011- patient told needs appointment.).  Thyroid Problem Presents for follow-up visit. Symptoms include anxiety (at times). Patient reports no depressed mood, heat intolerance, leg swelling, palpitations, visual change or weight loss. The symptoms have been stable. Her past medical history is significant for diabetes and hyperlipidemia.      Review of Systems  Constitutional: Negative for weight loss and malaise/fatigue.  HENT: Negative for neck pain.   Respiratory: Negative for shortness of breath.   Cardiovascular: Negative for chest pain and palpitations.  Endocrine: Negative for heat intolerance, polydipsia, polyphagia and polyuria.  Musculoskeletal: Negative for myalgias.  Neurological: Negative for weakness and headaches.  All other systems reviewed and are negative.       Objective:   Physical Exam  Constitutional: She is oriented to person, place, and time. She appears well-developed and well-nourished.  HENT:  Nose: Nose normal.  Mouth/Throat: Oropharynx is clear and moist.  Eyes: EOM are normal.  Neck: Trachea normal, normal range of motion and full passive range of motion without pain. Neck supple. No JVD present. Carotid bruit is not present. No thyromegaly  present.  Cardiovascular: Normal rate, regular rhythm, normal heart sounds and intact distal pulses.  Exam reveals no gallop and  no friction rub.   No murmur heard. Pulmonary/Chest: Effort normal and breath sounds normal.  Abdominal: Soft. Bowel sounds are normal. She exhibits no distension and no mass. There is no tenderness.  Musculoskeletal: Normal range of motion.  Lymphadenopathy:    She has no cervical adenopathy.  Neurological: She is alert and oriented to person, place, and time. She has normal reflexes.  Skin: Skin is warm and dry.  Psychiatric: She has a normal mood and affect. Her behavior is normal. Judgment and thought content normal.   BP 149/79  Pulse 63  Temp(Src) 98.2 F (36.8 C) (Oral)  Ht 5' 7.5" (1.715 m)  Wt 188 lb 8 oz (85.503 kg)  BMI 29.07 kg/m2 See diabetic foot exam.  Results for orders placed in visit on 04/08/13  POCT GLYCOSYLATED HEMOGLOBIN (HGB A1C)      Result Value Range   Hemoglobin A1C 6.8%          Assessment & Plan:   1. Diabetes type 2, uncontrolled   2. Hyperlipidemia   3. Hypertension   4. Hypothyroidism    Orders Placed This Encounter  Procedures  . NMR, lipoprofile  . CMP14+EGFR  . POCT glycosylated hemoglobin (Hb A1C)    Outpatient Encounter Prescriptions as of 04/08/2013  Medication Sig Dispense Refill  . amLODipine (NORVASC) 10 MG tablet Take 1 tablet (10 mg total) by mouth daily.  90 tablet  1  . aspirin (SB LOW DOSE ASA EC) 81 MG EC tablet Take 81 mg by mouth daily.        . benazepril (LOTENSIN) 20 MG tablet Take 1 tablet (20 mg total) by mouth daily.  90 tablet  1  . calcium carbonate (OS-CAL) 600 MG TABS Take 600 mg by mouth 2 (two) times daily with a meal.      . Cholecalciferol (VITAMIN D) 2000 UNITS CAPS Take by mouth.        . etodolac (LODINE) 400 MG tablet Take 400 mg by mouth 2 (two) times daily.      . furosemide (LASIX) 40 MG tablet Take 1 tablet (40 mg total) by mouth daily.  90 tablet  1  . levothyroxine  (SYNTHROID, LEVOTHROID) 75 MCG tablet Take 75 mcg by mouth daily before breakfast.      . lovastatin (MEVACOR) 40 MG tablet Take 2 tablets by mouth at bedtime daily  180 tablet  1  . omeprazole (PRILOSEC) 40 MG capsule Take 1 capsule (40 mg total) by mouth daily.  90 capsule  1  . potassium chloride SA (KLOR-CON M20) 20 MEQ tablet Take 1 tablet (20 mEq total) by mouth daily.  90 tablet  1  . traMADol (ULTRAM) 50 MG tablet Take 1 tablet (50 mg total) by mouth daily.  90 tablet  1  . [DISCONTINUED] levothyroxine (SYNTHROID, LEVOTHROID) 50 MCG tablet Take 1 tablet (50 mcg total) by mouth daily before breakfast.  90 tablet  1   No facility-administered encounter medications on file as of 04/08/2013.    Continue all meds Labs pending Encouraged diet and exercise Follow up in 3 months All health maintence reviewed  Mary-Margaret Daphine Deutscher, FNP

## 2013-04-08 NOTE — Patient Instructions (Signed)

## 2013-04-10 LAB — CMP14+EGFR
ALT: 19 IU/L (ref 0–32)
Albumin/Globulin Ratio: 1.8 (ref 1.1–2.5)
BUN: 15 mg/dL (ref 8–27)
CO2: 28 mmol/L (ref 18–29)
Calcium: 9.6 mg/dL (ref 8.6–10.2)
Creatinine, Ser: 1.17 mg/dL — ABNORMAL HIGH (ref 0.57–1.00)
GFR calc non Af Amer: 44 mL/min/{1.73_m2} — ABNORMAL LOW (ref >59)
Globulin, Total: 2.5 g/dL (ref 1.5–4.5)
Glucose: 157 mg/dL — ABNORMAL HIGH (ref 65–99)
Total Protein: 7 g/dL (ref 6.0–8.5)

## 2013-04-10 LAB — NMR, LIPOPROFILE
LDL Particle Number: 1390 nmol/L — ABNORMAL HIGH (ref <1000)
LDL Size: 20.2 nm — ABNORMAL LOW (ref >20.5)
LDLC SERPL CALC-MCNC: 76 mg/dL (ref <100)
LP-IR Score: 62 — ABNORMAL HIGH (ref <=45)

## 2013-04-11 LAB — THYROID PANEL WITH TSH

## 2013-04-14 LAB — THYROID PANEL WITH TSH
Free Thyroxine Index: 2.8 (ref 1.2–4.9)
T3 Uptake Ratio: 32 % (ref 24–39)
TSH: 3.11 u[IU]/mL (ref 0.450–4.500)

## 2013-05-09 ENCOUNTER — Ambulatory Visit (INDEPENDENT_AMBULATORY_CARE_PROVIDER_SITE_OTHER): Payer: Medicare Other

## 2013-05-09 DIAGNOSIS — Z23 Encounter for immunization: Secondary | ICD-10-CM

## 2013-06-03 ENCOUNTER — Ambulatory Visit (INDEPENDENT_AMBULATORY_CARE_PROVIDER_SITE_OTHER): Payer: Medicare Other | Admitting: General Practice

## 2013-06-03 ENCOUNTER — Emergency Department (HOSPITAL_COMMUNITY)
Admission: EM | Admit: 2013-06-03 | Discharge: 2013-06-03 | Disposition: A | Discharge status: Home / Self Care | Payer: Medicare Other | Attending: Emergency Medicine | Admitting: Emergency Medicine

## 2013-06-03 ENCOUNTER — Encounter (INDEPENDENT_AMBULATORY_CARE_PROVIDER_SITE_OTHER): Payer: Self-pay

## 2013-06-03 ENCOUNTER — Ambulatory Visit (INDEPENDENT_AMBULATORY_CARE_PROVIDER_SITE_OTHER): Payer: Medicare Other

## 2013-06-03 ENCOUNTER — Encounter: Payer: Self-pay | Admitting: General Practice

## 2013-06-03 ENCOUNTER — Telehealth: Payer: Self-pay | Admitting: Nurse Practitioner

## 2013-06-03 ENCOUNTER — Encounter (HOSPITAL_COMMUNITY): Payer: Self-pay | Admitting: Emergency Medicine

## 2013-06-03 VITALS — BP 148/78 | HR 94 | Temp 98.1°F | Ht 67.5 in | Wt 188.5 lb

## 2013-06-03 DIAGNOSIS — Y9301 Activity, walking, marching and hiking: Secondary | ICD-10-CM | POA: Insufficient documentation

## 2013-06-03 DIAGNOSIS — S82892A Other fracture of left lower leg, initial encounter for closed fracture: Secondary | ICD-10-CM

## 2013-06-03 DIAGNOSIS — W010XXA Fall on same level from slipping, tripping and stumbling without subsequent striking against object, initial encounter: Secondary | ICD-10-CM | POA: Insufficient documentation

## 2013-06-03 DIAGNOSIS — S82899A Other fracture of unspecified lower leg, initial encounter for closed fracture: Secondary | ICD-10-CM

## 2013-06-03 DIAGNOSIS — S82832A Other fracture of upper and lower end of left fibula, initial encounter for closed fracture: Secondary | ICD-10-CM

## 2013-06-03 DIAGNOSIS — Z8709 Personal history of other diseases of the respiratory system: Secondary | ICD-10-CM | POA: Insufficient documentation

## 2013-06-03 DIAGNOSIS — K219 Gastro-esophageal reflux disease without esophagitis: Secondary | ICD-10-CM | POA: Insufficient documentation

## 2013-06-03 DIAGNOSIS — R52 Pain, unspecified: Secondary | ICD-10-CM

## 2013-06-03 DIAGNOSIS — E039 Hypothyroidism, unspecified: Secondary | ICD-10-CM | POA: Insufficient documentation

## 2013-06-03 DIAGNOSIS — E876 Hypokalemia: Secondary | ICD-10-CM | POA: Insufficient documentation

## 2013-06-03 DIAGNOSIS — E7800 Pure hypercholesterolemia, unspecified: Secondary | ICD-10-CM | POA: Insufficient documentation

## 2013-06-03 DIAGNOSIS — Z880 Allergy status to penicillin: Secondary | ICD-10-CM | POA: Insufficient documentation

## 2013-06-03 DIAGNOSIS — Z79899 Other long term (current) drug therapy: Secondary | ICD-10-CM | POA: Insufficient documentation

## 2013-06-03 DIAGNOSIS — Y929 Unspecified place or not applicable: Secondary | ICD-10-CM | POA: Insufficient documentation

## 2013-06-03 DIAGNOSIS — Z7982 Long term (current) use of aspirin: Secondary | ICD-10-CM | POA: Insufficient documentation

## 2013-06-03 DIAGNOSIS — Z791 Long term (current) use of non-steroidal anti-inflammatories (NSAID): Secondary | ICD-10-CM | POA: Insufficient documentation

## 2013-06-03 DIAGNOSIS — E669 Obesity, unspecified: Secondary | ICD-10-CM | POA: Insufficient documentation

## 2013-06-03 DIAGNOSIS — E785 Hyperlipidemia, unspecified: Secondary | ICD-10-CM | POA: Insufficient documentation

## 2013-06-03 DIAGNOSIS — I10 Essential (primary) hypertension: Secondary | ICD-10-CM | POA: Insufficient documentation

## 2013-06-03 DIAGNOSIS — E559 Vitamin D deficiency, unspecified: Secondary | ICD-10-CM | POA: Insufficient documentation

## 2013-06-03 DIAGNOSIS — E119 Type 2 diabetes mellitus without complications: Secondary | ICD-10-CM | POA: Insufficient documentation

## 2013-06-03 LAB — POCT URINALYSIS DIPSTICK
Blood, UA: NEGATIVE
Protein, UA: NEGATIVE
Spec Grav, UA: 1.025
Urobilinogen, UA: NEGATIVE

## 2013-06-03 LAB — POCT UA - MICROSCOPIC ONLY: Casts, Ur, LPF, POC: NEGATIVE

## 2013-06-03 NOTE — ED Provider Notes (Signed)
CSN: [id number]     Arrival date & time 06/03/13  1809 History   First MD Initiated Contact with Patient 06/03/13 1844     Chief Complaint  Patient presents with  . Ankle Pain    HPI Pt was seen at 1850.  Per pt, c/o gradual onset and persistence of constant left ankle "pain" since yesterday. Pt states she tripped and fell while walking in her yard yesterday before the pain began. Pt has been ambulatory since the incident. States she was evaluated by her PMD PTA, told her "ankle was broken," and then sent to the ED for further evaluation.  Denies hitting head, no LOC/AMS, no foot/knee/hip pain, no back pain, no abd pain, no CP/SOB, no prodromal symptoms before fall, no focal motor weakness, no tingling/numbness in extremities.    Ortho: Dr. Otelia Sergeant Past Medical History  Diagnosis Date  . Hypertension 1985  . Hypercholesterolemia 1998  . GERD (gastroesophageal reflux disease)   . Hiatal hernia   . Obesity   . Hypothyroidism   . NIDDM (non-insulin dependent diabetes mellitus)     diet controlled   . Hyperlipidemia   . Left knee pain   . URI (upper respiratory infection)   . Hypokalemia   . Edema   . Vitamin D deficiency    Past Surgical History  Procedure Laterality Date  . Total abdominal hysterectomy w/ bilateral salpingoophorectomy  1980  . Appendectomy  1980  . Tonsillectomy    . Cholecystectomy  5/00  . Back surgery    . Colonoscopy    . Upper gastrointestinal endoscopy     Family History  Problem Relation Age of Onset  . Uterine cancer Sister   . Colon cancer Sister     metastatic ovarian?  . Ovarian cancer Sister   . Diabetes Mother   . Stroke Mother   . Diabetes Brother   . Diabetes Sister     x3  . Heart disease Father    History  Substance Use Topics  . Smoking status: Never Smoker   . Smokeless tobacco: Never Used  . Alcohol Use: No    Review of Systems ROS: Statement: All systems negative except as marked or noted in the HPI; Constitutional:  Negative for fever and chills. ; ; Eyes: Negative for eye pain, redness and discharge. ; ; ENMT: Negative for ear pain, hoarseness, nasal congestion, sinus pressure and sore throat. ; ; Cardiovascular: Negative for chest pain, palpitations, diaphoresis, dyspnea and peripheral edema. ; ; Respiratory: Negative for cough, wheezing and stridor. ; ; Gastrointestinal: Negative for nausea, vomiting, diarrhea, abdominal pain, blood in stool, hematemesis, jaundice and rectal bleeding. . ; ; Genitourinary: Negative for dysuria, flank pain and hematuria. ; ; Musculoskeletal: Negative for back pain and neck pain. +left ankle pain, swelling.; ; Skin: Negative for pruritus, rash, abrasions, blisters, bruising and skin lesion.; ; Neuro: Negative for headache, lightheadedness and neck stiffness. Negative for weakness, altered level of consciousness , altered mental status, extremity weakness, paresthesias, involuntary movement, seizure and syncope.       Allergies  Macrodantin; Metformin and related; and Penicillins  Home Medications   Current Outpatient Rx  Name  Route  Sig  Dispense  Refill  . amLODipine (NORVASC) 10 MG tablet   Oral   Take 1 tablet (10 mg total) by mouth daily.   90 tablet   1   . aspirin (SB LOW DOSE ASA EC) 81 MG EC tablet   Oral   Take 81 mg by  mouth daily.           . benazepril (LOTENSIN) 20 MG tablet   Oral   Take 1 tablet (20 mg total) by mouth daily.   90 tablet   1   . calcium carbonate (OS-CAL) 600 MG TABS   Oral   Take 600 mg by mouth daily.          . Cholecalciferol (VITAMIN D) 2000 UNITS CAPS   Oral   Take 2,000 Units by mouth daily.          [name] etodolac (LODINE) 400 MG tablet   Oral   Take 400 mg by mouth 2 (two) times daily.         . furosemide (LASIX) 40 MG tablet   Oral   Take 1 tablet (40 mg total) by mouth daily.   90 tablet   1   . levothyroxine (SYNTHROID, LEVOTHROID) 75 MCG tablet   Oral   Take 75 mcg by mouth daily before  breakfast.         . lovastatin (MEVACOR) 40 MG tablet   Oral   Take 80 mg by mouth at bedtime. Take 2 tablets by mouth at bedtime daily         . omeprazole (PRILOSEC) 40 MG capsule   Oral   Take 1 capsule (40 mg total) by mouth daily.   90 capsule   1   . potassium chloride SA (KLOR-CON M20) 20 MEQ tablet   Oral   Take 1 tablet (20 mEq total) by mouth daily.   90 tablet   1   . traMADol (ULTRAM) 50 MG tablet   Oral   Take 1 tablet (50 mg total) by mouth daily.   90 tablet   1    BP 156/70  Pulse 84  Temp(Src) 98.3 F (36.8 C) (Oral)  Resp 18  Ht 5\' 8"  (1.727 m)  Wt 190 lb (86.183 kg)  BMI 28.9 kg/m2  SpO2 98% Physical Exam 1855: Physical examination:  Nursing notes reviewed; Vital signs and O2 SAT reviewed;  Constitutional: Well developed, Well nourished, Well hydrated, In no acute distress; Head:  Normocephalic, atraumatic; Eyes: EOMI, PERRL, No scleral icterus; ENMT: Mouth and pharynx normal, Mucous membranes moist; Neck: Supple, Full range of motion, No lymphadenopathy; Cardiovascular: Regular rate and rhythm, No gallop; Respiratory: Breath sounds clear & equal bilaterally, No rales, rhonchi, wheezes.  Speaking full sentences with ease, Normal respiratory effort/excursion; Chest: Nontender, Movement normal; Abdomen: Soft, Nontender, Nondistended, Normal bowel sounds; Genitourinary: No CVA tenderness; Extremities: Pulses normal. +tender to palp left lateral maleolar area w/localized edema and faint ecchymosis. NMS intact left foot, strong pedal pp, LE muscle compartments soft.  No left proximal fibular head tenderness, no left hip tenderness, no left knee tenderness, no left foot tenderness.  No deformity, no erythema, no open wounds.  +plantarflexion of left foot w/calf squeeze.  No palpable gap left Achilles's tendon. No calf edema or asymmetry.; Neuro: AA&Ox3, Major CN grossly intact.  Speech clear. Climbs on and off stretcher easily by herself. Gait steady.  No gross  focal motor or sensory deficits in extremities.; Skin: Color normal, Warm, Dry.    ED Course  Procedures    MDM  MDM Reviewed: previous chart, nursing note and vitals Interpretation: x-ray     Dg Ankle Complete Left 06/03/2013   CLINICAL DATA:  Larey Seat down steps, pain, possible fracture  EXAM: LEFT ANKLE COMPLETE - 3+ VIEW  COMPARISON:  None  FINDINGS: Lateral soft  tissue swelling.  Bones appear demineralized.  Ankle mortise intact.  Nondisplaced lateral malleolar fracture.  Small plantar calcaneal spur.  No additional fracture, dislocation, or bone destruction.  IMPRESSION: Nondisplaced transverse lateral malleolar fracture.   Electronically Signed   By: Ulyses Southward M.D.   On: 06/03/2013 15:39    1910:  Will tx with cam walker.  Pt states she has an Orthopedic doctor (Dr. Otelia Sergeant) and she can f/u with him next week. Wants to go home now. Already has rx ultram and lodine for pain. Dx and testing d/w pt.  Questions answered.  Verb understanding, agreeable to d/c home with outpt f/u.   Laray Anger, DO 06/05/13 2123

## 2013-06-03 NOTE — ED Notes (Signed)
Pt says she fell yesterday, Seen by MD today and told she has a fx of lt  Ankle.

## 2013-06-03 NOTE — Progress Notes (Signed)
Subjective:    Patient ID: Katie [name], female    DOB: 10-Jan-1932, 79 y.o.   MRN: [id number]  Ankle Pain  The incident occurred 12 to 24 hours ago. The incident occurred at home. The injury mechanism was a fall. The pain is present in the left ankle. The quality of the pain is described as aching. The pain is at a severity of 6/10. The pain is moderate. The pain has been fluctuating since onset. Pertinent negatives include no inability to bear weight or loss of motion. She reports no foreign bodies present. The symptoms are aggravated by movement and palpation. She has tried NSAIDs for the symptoms.  Patient also complained of UTI symptoms. Reports onset was two days ago, frequent urination and urgency. Denies OTC medications. Denies pain radiating, bloody urine or foul smelling.     Review of Systems  Constitutional: Negative for fever.  Respiratory: Negative for chest tightness and shortness of breath.   Cardiovascular: Negative for chest pain.  Musculoskeletal: Positive for joint swelling.       Left ankle pain   Neurological: Negative for dizziness, weakness and headaches.       Objective:   Physical Exam  Constitutional: She is oriented to person, place, and time. She appears well-developed and well-nourished.  Cardiovascular: Normal rate, regular rhythm and normal heart sounds.   Pulmonary/Chest: Effort normal and breath sounds normal.  Musculoskeletal: She exhibits edema and tenderness.  Tenderness and edema, 2+ non pitting,  noted to left ankle. Limited range of motion. Capillary refill less than 3 seconds. Mild bruising noted to lateral left ankle  Neurological: She is alert and oriented to person, place, and time.  Skin: Skin is warm and dry.  Psychiatric: She has a normal mood and affect.      [facility] reading (PRIMARY) by Coralie Keens, FNP-C, Fracture noted to left ankle.                                Results for orders placed in visit on 06/03/13  URINE CULTURE       Result Value Range   Urine Culture, Routine Final report (*)    Result 1 Klebsiella pneumoniae (*)    ANTIMICROBIAL SUSCEPTIBILITY Comment    POCT UA - MICROSCOPIC ONLY      Result Value Range   WBC, Ur, HPF, POC occ     RBC, urine, microscopic 1-10     Bacteria, U Microscopic mod     Mucus, UA mod     Epithelial cells, urine per micros few     Crystals, Ur, HPF, POC few     Casts, Ur, LPF, POC neg     Yeast, UA neg    POCT URINALYSIS DIPSTICK      Result Value Range   Color, UA gold     Clarity, UA clear     Glucose, UA neg     Bilirubin, UA small     Ketones, UA large     Spec Grav, UA 1.025     Blood, UA neg     pH, UA 5.0     Protein, UA neg     Urobilinogen, UA negative     Nitrite, UA pos     Leukocytes, UA Negative          Assessment & Plan:  1. Pain  - DG Ankle Complete Left; Future - POCT UA - Microscopic Only -  POCT urinalysis dipstick - Urine culture  2. Closed left ankle fracture, initial encounter -Patient informed she should go to hospital for further evaluation  -reports she would have her son drive her to [facility]  3. UTI -Increase fluid intake AZO over the counter X2 days Frequent voiding Proper perineal hygiene RTO prn Patient verbalized understanding Coralie Keens, FNP-C

## 2013-06-03 NOTE — Telephone Encounter (Signed)
appt made

## 2013-06-03 NOTE — Patient Instructions (Signed)
Ankle Fracture  A fracture is a break in the bone. A cast or splint is used to protect and keep your injured bone from moving.   HOME CARE INSTRUCTIONS    Use your crutches as directed.   To lessen the swelling, keep the injured leg elevated while sitting or lying down.   Apply ice to the injury for 15-20 minutes, 3-4 times per day while awake for 2 days. Put the ice in a plastic bag and place a thin towel between the bag of ice and your cast.   If you have a plaster or fiberglass cast:   Do not try to scratch the skin under the cast using sharp or pointed objects.   Check the skin around the cast every day. You may put lotion on any red or sore areas.   Keep your cast dry and clean.   If you have a plaster splint:   Wear the splint as directed.   You may loosen the elastic around the splint if your toes become numb, tingle, or turn cold or blue.   Do not put pressure on any part of your cast or splint; it may break. Rest your cast only on a pillow the first 24 hours until it is fully hardened.   Your cast or splint can be protected during bathing with a plastic bag. Do not lower the cast or splint into water.   Take medications as directed by your caregiver. Only take over-the-counter or prescription medicines for pain, discomfort, or fever as directed by your caregiver.   Do not drive a vehicle until your caregiver specifically tells you it is safe to do so.   If your caregiver has given you a follow-up appointment, it is very important to keep that appointment. Not keeping the appointment could result in a chronic or permanent injury, pain, and disability. If there is any problem keeping the appointment, you must call back to this facility for assistance.  SEEK IMMEDIATE MEDICAL CARE IF:    Your cast gets damaged or breaks.   You have continued severe pain or more swelling than you did before the cast was put on.   Your skin or toenails below the injury turn blue or gray, or feel cold or  numb.   There is a bad smell or new stains and/or purulent (pus like) drainage coming from under the cast.  If you do not have a window in your cast for observing the wound, a discharge or minor bleeding may show up as a stain on the outside of your cast. Report these findings to your caregiver.  MAKE SURE YOU:    Understand these instructions.   Will watch your condition.   Will get help right away if you are not doing well or get worse.  Document Released: 08/01/2000 Document Revised: 10/27/2011 Document Reviewed: 03/07/2008  [employer] Patient Information 2014 [employer].

## 2013-06-05 LAB — URINE CULTURE

## 2013-06-06 ENCOUNTER — Other Ambulatory Visit: Payer: Self-pay | Admitting: General Practice

## 2013-06-06 DIAGNOSIS — N390 Urinary tract infection, site not specified: Secondary | ICD-10-CM

## 2013-06-06 MED ORDER — CIPROFLOXACIN HCL 500 MG PO TABS: 500.0000 mg | ORAL_TABLET | Freq: Two times a day (BID) | ORAL | Status: DC

## 2013-06-17 LAB — HM DIABETES EYE EXAM

## 2013-06-24 ENCOUNTER — Telehealth: Payer: Self-pay | Admitting: General Practice

## 2013-06-24 DIAGNOSIS — N390 Urinary tract infection, site not specified: Secondary | ICD-10-CM

## 2013-06-24 MED ORDER — CIPROFLOXACIN HCL 500 MG PO TABS: 500.0000 mg | ORAL_TABLET | Freq: Two times a day (BID) | ORAL | Status: DC

## 2013-06-24 NOTE — Telephone Encounter (Signed)
cipro rx sent to pharmacy. 

## 2013-06-24 NOTE — Telephone Encounter (Signed)
Patient aware.

## 2013-06-24 NOTE — Telephone Encounter (Signed)
Katie [name] is off today can you please advise?

## 2013-07-21 ENCOUNTER — Ambulatory Visit: Payer: Medicare Other | Admitting: Nurse Practitioner

## 2013-07-22 ENCOUNTER — Encounter: Payer: Self-pay | Admitting: Nurse Practitioner

## 2013-07-22 ENCOUNTER — Ambulatory Visit (INDEPENDENT_AMBULATORY_CARE_PROVIDER_SITE_OTHER): Payer: Medicare Other | Admitting: Nurse Practitioner

## 2013-07-22 VITALS — BP 164/74 | HR 66 | Temp 97.3°F | Ht 67.5 in | Wt 189.0 lb

## 2013-07-22 DIAGNOSIS — R609 Edema, unspecified: Secondary | ICD-10-CM

## 2013-07-22 DIAGNOSIS — K5900 Constipation, unspecified: Secondary | ICD-10-CM

## 2013-07-22 DIAGNOSIS — E119 Type 2 diabetes mellitus without complications: Secondary | ICD-10-CM

## 2013-07-22 DIAGNOSIS — E785 Hyperlipidemia, unspecified: Secondary | ICD-10-CM

## 2013-07-22 DIAGNOSIS — E876 Hypokalemia: Secondary | ICD-10-CM

## 2013-07-22 DIAGNOSIS — I10 Essential (primary) hypertension: Secondary | ICD-10-CM

## 2013-07-22 DIAGNOSIS — E039 Hypothyroidism, unspecified: Secondary | ICD-10-CM

## 2013-07-22 DIAGNOSIS — R300 Dysuria: Secondary | ICD-10-CM

## 2013-07-22 DIAGNOSIS — K219 Gastro-esophageal reflux disease without esophagitis: Secondary | ICD-10-CM

## 2013-07-22 LAB — POCT URINALYSIS DIPSTICK
Glucose, UA: NEGATIVE
Nitrite, UA: NEGATIVE
Protein, UA: NEGATIVE
Spec Grav, UA: 1.01
Urobilinogen, UA: NEGATIVE

## 2013-07-22 LAB — POCT UA - MICROSCOPIC ONLY
RBC, urine, microscopic: NEGATIVE
Yeast, UA: NEGATIVE

## 2013-07-22 LAB — POCT GLYCOSYLATED HEMOGLOBIN (HGB A1C): Hemoglobin A1C: 6.1

## 2013-07-22 MED ORDER — BENAZEPRIL HCL 20 MG PO TABS: 20.0000 mg | ORAL_TABLET | Freq: Every day | ORAL | Status: DC

## 2013-07-22 MED ORDER — AMLODIPINE BESYLATE 10 MG PO TABS: 10.0000 mg | ORAL_TABLET | Freq: Every day | ORAL | Status: DC

## 2013-07-22 MED ORDER — POTASSIUM CHLORIDE CRYS ER 20 MEQ PO TBCR: 20.0000 meq | EXTENDED_RELEASE_TABLET | Freq: Every day | ORAL | Status: DC

## 2013-07-22 MED ORDER — OMEPRAZOLE 40 MG PO CPDR: 40.0000 mg | DELAYED_RELEASE_CAPSULE | Freq: Every day | ORAL | Status: DC

## 2013-07-22 MED ORDER — LOVASTATIN 40 MG PO TABS: 80.0000 mg | ORAL_TABLET | Freq: Every day | ORAL | Status: DC

## 2013-07-22 MED ORDER — FUROSEMIDE 40 MG PO TABS: 40.0000 mg | ORAL_TABLET | Freq: Every day | ORAL | Status: DC

## 2013-07-22 MED ORDER — LUBIPROSTONE 8 MCG PO CAPS: 8.0000 ug | ORAL_CAPSULE | Freq: Two times a day (BID) | ORAL | Status: DC

## 2013-07-22 NOTE — Patient Instructions (Signed)

## 2013-07-22 NOTE — Progress Notes (Signed)
Subjective:    Patient ID: Katie [name], female    DOB: [birth year], 79 y.o.   MRN: [id number]  Hypertension This is a chronic problem. The current episode started more than 1 year ago. The problem is unchanged. The problem is controlled (patient didn't take her blood pressure meds this AM.). Pertinent negatives include no chest pain, headaches, malaise/fatigue, neck pain, palpitations, peripheral edema or shortness of breath. There are no associated agents to hypertension. Risk factors for coronary artery disease include dyslipidemia, obesity and post-menopausal state. Past treatments include ACE inhibitors, calcium channel blockers and diuretics. The current treatment provides significant improvement. Compliance problems include exercise and diet.  Hypertensive end-organ damage includes a thyroid problem.  Hyperlipidemia This is a chronic problem. The current episode started more than 1 year ago. The problem is controlled. Recent lipid tests were reviewed and are normal. Exacerbating diseases include diabetes, hypothyroidism and obesity. Pertinent negatives include no chest pain, leg pain, myalgias or shortness of breath. Current antihyperlipidemic treatment includes statins. The current treatment provides moderate improvement of lipids. Compliance problems include adherence to diet and adherence to exercise.  Risk factors for coronary artery disease include diabetes mellitus, hypertension, obesity and post-menopausal.  Diabetes She presents for her follow-up diabetic visit. She has type 2 diabetes mellitus. No MedicAlert identification noted. The initial diagnosis of diabetes was made 5 years ago. Her disease course has been stable. There are no hypoglycemic associated symptoms. Pertinent negatives for hypoglycemia include no headaches. There are no diabetic associated symptoms. Pertinent negatives for diabetes include no chest pain, no polydipsia, no polyphagia, no polyuria, no visual change, no  weakness and no weight loss. There are no hypoglycemic complications. Symptoms are stable. There are no diabetic complications. Risk factors for coronary artery disease include hypertension, obesity and post-menopausal. Current diabetic treatment includes diet. She is compliant with treatment most of the time. Her weight is stable. She is following a diabetic diet. When asked about meal planning, she reported none. She has not had a previous visit with a dietician. She rarely participates in exercise. There is no change in her home blood glucose trend. (Patent hasn't been checking blood sugar at home.) An ACE inhibitor/angiotensin II receptor blocker is being taken. She does not see a podiatrist.Eye exam is not current (2011- patient told needs appointment.).  Thyroid Problem Presents for follow-up visit. Symptoms include anxiety (at times). Patient reports no depressed mood, heat intolerance, leg swelling, palpitations, visual change or weight loss. The symptoms have been stable. Her past medical history is significant for diabetes and hyperlipidemia.  hypokalemia klor con - no c/o lower ext cramping GERD omperazole working well to keep symptoms under control Constipation Patient tried amitiza in the past and it really helped would like to try again  * patient hasn't taken any of her meds today  Review of Systems  Constitutional: Negative for weight loss and malaise/fatigue.  Respiratory: Negative for shortness of breath.   Cardiovascular: Negative for chest pain and palpitations.  Endocrine: Negative for heat intolerance, polydipsia, polyphagia and polyuria.  Musculoskeletal: Negative for myalgias and neck pain.  Neurological: Negative for weakness and headaches.  All other systems reviewed and are negative.       Objective:   Physical Exam  Constitutional: She is oriented to person, place, and time. She appears well-developed and well-nourished.  HENT:  Nose: Nose normal.   Mouth/Throat: Oropharynx is clear and moist.  Eyes: EOM are normal.  Neck: Trachea normal, normal range of motion and full passive  range of motion without pain. Neck supple. No JVD present. Carotid bruit is not present. No thyromegaly present.  Cardiovascular: Normal rate, regular rhythm, normal heart sounds and intact distal pulses.  Exam reveals no gallop and no friction rub.   No murmur heard. Pulmonary/Chest: Effort normal and breath sounds normal.  Abdominal: Soft. Bowel sounds are normal. She exhibits no distension and no mass. There is no tenderness.  Musculoskeletal: Normal range of motion.  Cam boot in place from right ankle break  Lymphadenopathy:    She has no cervical adenopathy.  Neurological: She is alert and oriented to person, place, and time. She has normal reflexes.  Skin: Skin is warm and dry.  Psychiatric: She has a normal mood and affect. Her behavior is normal. Judgment and thought content normal.   BP 164/74  Pulse 66  Temp(Src) 97.3 F (36.3 C) (Oral)  Ht 5' 7.5" (1.715 m)  Wt 189 lb (85.73 kg)  BMI 29.15 kg/m2 See diabetic foot exam. Results for orders placed in visit on 07/22/13  POCT GLYCOSYLATED HEMOGLOBIN (HGB A1C)      Result Value Range   Hemoglobin A1C 6.1    POCT URINALYSIS DIPSTICK      Result Value Range   Color, UA yellow     Clarity, UA clear     Glucose, UA neg     Bilirubin, UA neg     Ketones, UA neg     Spec Grav, UA 1.010     Blood, UA neg     pH, UA 6.0     Protein, UA neg     Urobilinogen, UA negative     Nitrite, UA neg     Leukocytes, UA Negative    POCT UA - MICROSCOPIC ONLY      Result Value Range   WBC, Ur, HPF, POC 1-5     RBC, urine, microscopic neg     Bacteria, U Microscopic occ     Mucus, UA neg     Epithelial cells, urine per micros occ     Crystals, Ur, HPF, POC neg     Casts, Ur, LPF, POC neg     Yeast, UA neg            Assessment & Plan:   1. Hypertension   2. Hyperlipidemia   3. Diabetes  mellitus type 2, diet-controlled   4. Dysuria   5. GERD (gastroesophageal reflux disease)   6. Hypokalemia   7. Hypothyroidism   8. Peripheral edema   9. Constipation    Orders Placed This Encounter  Procedures  . CMP14+EGFR  . NMR, lipoprofile  . POCT glycosylated hemoglobin (Hb A1C)  . POCT urinalysis dipstick  . POCT UA - Microscopic Only   Meds ordered this encounter  Medications  . levothyroxine (SYNTHROID, LEVOTHROID) 50 MCG tablet    Sig: Take 50 mcg by mouth daily before breakfast.  . amLODipine (NORVASC) 10 MG tablet    Sig: Take 1 tablet (10 mg total) by mouth daily.    Dispense:  90 tablet    Refill:  1    Order Specific Question:  Supervising Provider    Answer:  Ernestina Penna [[id number]]  . benazepril (LOTENSIN) 20 MG tablet    Sig: Take 1 tablet (20 mg total) by mouth daily.    Dispense:  90 tablet    Refill:  1    Order Specific Question:  Supervising Provider    Answer:  Ernestina Penna [[id number]]  .  furosemide (LASIX) 40 MG tablet    Sig: Take 1 tablet (40 mg total) by mouth daily.    Dispense:  90 tablet    Refill:  1    Order Specific Question:  Supervising Provider    Answer:  Ernestina Penna [[id number]]  . lovastatin (MEVACOR) 40 MG tablet    Sig: Take 2 tablets (80 mg total) by mouth at bedtime. Take 2 tablets by mouth at bedtime daily    Dispense:  180 tablet    Refill:  1    Order Specific Question:  Supervising Provider    Answer:  Ernestina Penna [[id number]]  . omeprazole (PRILOSEC) 40 MG capsule    Sig: Take 1 capsule (40 mg total) by mouth daily.    Dispense:  90 capsule    Refill:  1    Order Specific Question:  Supervising Provider    Answer:  Ernestina Penna [[id number]]  . potassium chloride SA (KLOR-CON M20) 20 MEQ tablet    Sig: Take 1 tablet (20 mEq total) by mouth daily.    Dispense:  90 tablet    Refill:  1    Order Specific Question:  Supervising Provider    Answer:  Ernestina Penna [[id number]]  . lubiprostone (AMITIZA) 8 MCG capsule    Sig: Take 1  capsule (8 mcg total) by mouth 2 (two) times daily with a meal.    Dispense:  40 capsule    Refill:  0    Order Specific Question:  Supervising Provider    Answer:  Ernestina Penna [[id number]]   Increase fiber in diet Continue all meds Labs pending Diet and exercise encouraged Health maintenance reviewed Follow up in 3 months  Mary-Margaret Daphine Deutscher, FNP

## 2013-07-24 LAB — CMP14+EGFR
ALT: 12 IU/L (ref 0–32)
AST: 17 IU/L (ref 0–40)
Albumin/Globulin Ratio: 2.1 (ref 1.1–2.5)
Alkaline Phosphatase: 78 IU/L (ref 39–117)
CO2: 26 mmol/L (ref 18–29)
Chloride: 100 mmol/L (ref 97–108)
GFR calc Af Amer: 64 mL/min/{1.73_m2} (ref >59)
GFR calc non Af Amer: 56 mL/min/{1.73_m2} — ABNORMAL LOW (ref >59)
Glucose: 147 mg/dL — ABNORMAL HIGH (ref 65–99)
Potassium: 4 mmol/L (ref 3.5–5.2)
Sodium: 140 mmol/L (ref 134–144)
Total Bilirubin: 0.4 mg/dL (ref 0.0–1.2)
Total Protein: 6.5 g/dL (ref 6.0–8.5)

## 2013-07-24 LAB — NMR, LIPOPROFILE
LDL Particle Number: 1059 nmol/L — ABNORMAL HIGH (ref <1000)
LDL Size: 20.6 nm (ref >20.5)
LDLC SERPL CALC-MCNC: 75 mg/dL (ref <100)
LP-IR Score: 47 — ABNORMAL HIGH (ref <=45)
Small LDL Particle Number: 525 nmol/L (ref <=527)

## 2013-08-30 ENCOUNTER — Other Ambulatory Visit: Payer: Self-pay

## 2013-08-30 DIAGNOSIS — M549 Dorsalgia, unspecified: Secondary | ICD-10-CM

## 2013-08-30 MED ORDER — TRAMADOL HCL 50 MG PO TABS: 50.0000 mg | ORAL_TABLET | Freq: Every day | ORAL | Status: DC

## 2013-08-30 NOTE — Telephone Encounter (Signed)
rx ready for pickup 

## 2013-08-30 NOTE — Telephone Encounter (Signed)
Last seen 07/22/13  MMM If approved print for mail order route to nurse

## 2013-08-31 ENCOUNTER — Telehealth: Payer: Self-pay | Admitting: *Deleted

## 2013-08-31 NOTE — Telephone Encounter (Signed)
Message left, pain medication script ready. 

## 2013-10-10 ENCOUNTER — Telehealth: Payer: Self-pay | Admitting: Nurse Practitioner

## 2013-10-10 NOTE — Telephone Encounter (Signed)
Patient said that she would find someone to bring it by in the morning will you put in an order?

## 2013-10-10 NOTE — Telephone Encounter (Signed)
Can someone bring specimen in for her

## 2013-10-10 NOTE — Telephone Encounter (Signed)
She has no way of leaving one please advise

## 2013-10-10 NOTE — Telephone Encounter (Signed)
Will put in order when get urine

## 2013-10-10 NOTE — Telephone Encounter (Signed)
Please send urine specimen

## 2013-10-12 ENCOUNTER — Telehealth: Payer: Self-pay | Admitting: Nurse Practitioner

## 2013-10-12 ENCOUNTER — Other Ambulatory Visit (INDEPENDENT_AMBULATORY_CARE_PROVIDER_SITE_OTHER): Payer: Medicare Other

## 2013-10-12 DIAGNOSIS — N390 Urinary tract infection, site not specified: Secondary | ICD-10-CM

## 2013-10-12 LAB — POCT URINALYSIS DIPSTICK
Bilirubin, UA: NEGATIVE
GLUCOSE UA: NEGATIVE
Ketones, UA: NEGATIVE
Leukocytes, UA: NEGATIVE
Nitrite, UA: NEGATIVE
PROTEIN UA: NEGATIVE
RBC UA: NEGATIVE
Spec Grav, UA: 1.01
UROBILINOGEN UA: NEGATIVE
pH, UA: 6

## 2013-10-12 LAB — POCT UA - MICROSCOPIC ONLY
Bacteria, U Microscopic: NEGATIVE
CRYSTALS, UR, HPF, POC: NEGATIVE
Casts, Ur, LPF, POC: NEGATIVE
Mucus, UA: NEGATIVE
YEAST UA: NEGATIVE

## 2013-10-12 NOTE — Progress Notes (Signed)
Pt dropped off urine sample only 

## 2013-10-13 NOTE — Telephone Encounter (Signed)
Patient aware of results.

## 2013-12-14 ENCOUNTER — Ambulatory Visit (INDEPENDENT_AMBULATORY_CARE_PROVIDER_SITE_OTHER): Payer: Medicare Other | Admitting: Nurse Practitioner

## 2013-12-14 ENCOUNTER — Other Ambulatory Visit: Payer: Medicare Other

## 2013-12-14 ENCOUNTER — Encounter: Payer: Self-pay | Admitting: Nurse Practitioner

## 2013-12-14 VITALS — BP 133/75 | HR 75 | Temp 97.6°F | Ht 67.0 in | Wt 182.0 lb

## 2013-12-14 DIAGNOSIS — K219 Gastro-esophageal reflux disease without esophagitis: Secondary | ICD-10-CM

## 2013-12-14 DIAGNOSIS — E785 Hyperlipidemia, unspecified: Secondary | ICD-10-CM

## 2013-12-14 DIAGNOSIS — E876 Hypokalemia: Secondary | ICD-10-CM

## 2013-12-14 DIAGNOSIS — M549 Dorsalgia, unspecified: Secondary | ICD-10-CM

## 2013-12-14 DIAGNOSIS — E039 Hypothyroidism, unspecified: Secondary | ICD-10-CM

## 2013-12-14 DIAGNOSIS — E119 Type 2 diabetes mellitus without complications: Secondary | ICD-10-CM

## 2013-12-14 DIAGNOSIS — I10 Essential (primary) hypertension: Secondary | ICD-10-CM

## 2013-12-14 LAB — POCT GLYCOSYLATED HEMOGLOBIN (HGB A1C)

## 2013-12-14 MED ORDER — OMEPRAZOLE 40 MG PO CPDR: 40.0000 mg | DELAYED_RELEASE_CAPSULE | Freq: Every day | ORAL | Status: DC

## 2013-12-14 MED ORDER — BENAZEPRIL HCL 20 MG PO TABS: 20.0000 mg | ORAL_TABLET | Freq: Every day | ORAL | Status: DC

## 2013-12-14 MED ORDER — TRAMADOL HCL 50 MG PO TABS: 50.0000 mg | ORAL_TABLET | Freq: Every day | ORAL | Status: DC

## 2013-12-14 MED ORDER — LOVASTATIN 40 MG PO TABS: 80.0000 mg | ORAL_TABLET | Freq: Every day | ORAL | Status: DC

## 2013-12-14 MED ORDER — POTASSIUM CHLORIDE CRYS ER 20 MEQ PO TBCR: 20.0000 meq | EXTENDED_RELEASE_TABLET | Freq: Every day | ORAL | Status: DC

## 2013-12-14 MED ORDER — AMLODIPINE BESYLATE 10 MG PO TABS: 10.0000 mg | ORAL_TABLET | Freq: Every day | ORAL | Status: DC

## 2013-12-14 NOTE — Patient Instructions (Signed)

## 2013-12-14 NOTE — Progress Notes (Signed)
Labs only

## 2013-12-14 NOTE — Progress Notes (Signed)
Subjective:    Patient ID: Katie [name], female    DOB: Jun 20, 1932, 80 y.o.   MRN: [id number]  Patient here today for follow up of chronic medical problems.  Hypertension This is a chronic problem. The current episode started more than 1 year ago. The problem is unchanged. The problem is controlled (patient didn't take her blood pressure meds this AM.). Pertinent negatives include no chest pain, headaches, malaise/fatigue, neck pain, palpitations, peripheral edema or shortness of breath. There are no associated agents to hypertension. Risk factors for coronary artery disease include dyslipidemia, obesity and post-menopausal state. Past treatments include ACE inhibitors, calcium channel blockers and diuretics. The current treatment provides significant improvement. Compliance problems include exercise and diet.  Hypertensive end-organ damage includes a thyroid problem.  Hyperlipidemia This is a chronic problem. The current episode started more than 1 year ago. The problem is controlled. Recent lipid tests were reviewed and are normal. Exacerbating diseases include diabetes, hypothyroidism and obesity. Pertinent negatives include no chest pain, leg pain, myalgias or shortness of breath. Current antihyperlipidemic treatment includes statins. The current treatment provides moderate improvement of lipids. Compliance problems include adherence to diet and adherence to exercise.  Risk factors for coronary artery disease include diabetes mellitus, hypertension, obesity and post-menopausal.  Diabetes She presents for her follow-up diabetic visit. She has type 2 diabetes mellitus. No MedicAlert identification noted. The initial diagnosis of diabetes was made 5 years ago. Her disease course has been stable. There are no hypoglycemic associated symptoms. Pertinent negatives for hypoglycemia include no headaches. There are no diabetic associated symptoms. Pertinent negatives for diabetes include no chest pain, no  polydipsia, no polyphagia, no polyuria, no visual change, no weakness and no weight loss. There are no hypoglycemic complications. Symptoms are stable. There are no diabetic complications. Risk factors for coronary artery disease include hypertension, obesity and post-menopausal. Current diabetic treatment includes diet. She is compliant with treatment most of the time. Her weight is stable. She is following a diabetic diet. When asked about meal planning, she reported none. She has not had a previous visit with a dietician. She rarely participates in exercise. There is no change in her home blood glucose trend. (Patent hasn't been checking blood sugar at home.) An ACE inhibitor/angiotensin II receptor blocker is being taken. She does not see a podiatrist.Eye exam is not current (2011- patient told needs appointment.).  Thyroid Problem Presents for follow-up visit. Symptoms include anxiety (at times). Patient reports no depressed mood, heat intolerance, leg swelling, palpitations, visual change or weight loss. The symptoms have been stable (patient said that she is uppose to be  on 47mcg of levothyroxin but was rx 18mcg- no where  on electronic chart does it say 22mcg.). Her past medical history is significant for diabetes and hyperlipidemia.  hypokalemia klor con 42meq- no c/o lower ext cramping GERD omperazole working well to keep symptoms under control Constipation Patient tried amitiza in the past and it really helped would like to try again    Review of Systems  Constitutional: Negative for weight loss and malaise/fatigue.  Respiratory: Negative for shortness of breath.   Cardiovascular: Negative for chest pain and palpitations.  Endocrine: Negative for heat intolerance, polydipsia, polyphagia and polyuria.  Musculoskeletal: Negative for myalgias and neck pain.  Neurological: Negative for weakness and headaches.  All other systems reviewed and are negative.      Objective:   Physical  Exam  Constitutional: She is oriented to person, place, and time. She appears well-developed and well-nourished.  HENT:  Nose: Nose normal.  Mouth/Throat: Oropharynx is clear and moist.  Eyes: EOM are normal.  Neck: Trachea normal, normal range of motion and full passive range of motion without pain. Neck supple. No JVD present. Carotid bruit is not present. No thyromegaly present.  Cardiovascular: Normal rate, regular rhythm, normal heart sounds and intact distal pulses.  Exam reveals no gallop and no friction rub.   No murmur heard. Pulmonary/Chest: Effort normal and breath sounds normal.  Abdominal: Soft. Bowel sounds are normal. She exhibits no distension and no mass. There is no tenderness.  Musculoskeletal: Normal range of motion.  Cam boot in place from right ankle break  Lymphadenopathy:    She has no cervical adenopathy.  Neurological: She is alert and oriented to person, place, and time. She has normal reflexes.  Skin: Skin is warm and dry.  Psychiatric: She has a normal mood and affect. Her behavior is normal. Judgment and thought content normal.   BP 133/75  Pulse 75  Temp(Src) 97.6 F (36.4 C) (Oral)  Ht 5\' 7"  (1.702 m)  Wt 182 lb (82.555 kg)  BMI 28.50 kg/m2 See diabetic foot exam. Results for orders placed in visit on 12/14/13  POCT GLYCOSYLATED HEMOGLOBIN (HGB A1C)      Result Value Ref Range   Hemoglobin A1C 6.6%            Assessment & Plan:   1. Hypothyroidism   2. Hypokalemia   3. Hypertension   4. Hyperlipidemia   5. GERD (gastroesophageal reflux disease)   6. Diabetes mellitus type 2, diet-controlled   7. Back pain    Orders Placed This Encounter  Procedures  . Thyroid Panel With TSH  . [facility] DIABETES EYE EXAM    This external order was created through the Results Console.   Meds ordered this encounter  Medications  . omeprazole (PRILOSEC) 40 MG capsule    Sig: Take 1 capsule (40 mg total) by mouth daily.    Dispense:  90 capsule    Refill:   1    Order Specific Question:  Supervising Provider    Answer:  Chipper Herb [[id number]]  . amLODipine (NORVASC) 10 MG tablet    Sig: Take 1 tablet (10 mg total) by mouth daily.    Dispense:  90 tablet    Refill:  1    Order Specific Question:  Supervising Provider    Answer:  Chipper Herb [[id number]]  . benazepril (LOTENSIN) 20 MG tablet    Sig: Take 1 tablet (20 mg total) by mouth daily.    Dispense:  90 tablet    Refill:  1    Order Specific Question:  Supervising Provider    Answer:  Chipper Herb [[id number]]  . traMADol (ULTRAM) 50 MG tablet    Sig: Take 1 tablet (50 mg total) by mouth daily.    Dispense:  90 tablet    Refill:  0    Order Specific Question:  Supervising Provider    Answer:  Chipper Herb [[id number]]  . potassium chloride SA (KLOR-CON M20) 20 MEQ tablet    Sig: Take 1 tablet (20 mEq total) by mouth daily.    Dispense:  90 tablet    Refill:  1    Order Specific Question:  Supervising Provider    Answer:  Chipper Herb [[id number]]  . lovastatin (MEVACOR) 40 MG tablet    Sig: Take 2 tablets (80 mg total) by mouth at bedtime. Take 2 tablets  by mouth at bedtime daily    Dispense:  180 tablet    Refill:  1    Order Specific Question:  Supervising Provider    Answer:  Joycelyn Man   Will wait on lab result to rx levithyroxin Labs pending Health maintenance reviewed Diet and exercise encouraged Continue all meds Follow up  In 3 months   [place], FNP

## 2013-12-15 LAB — NMR, LIPOPROFILE
Cholesterol: 155 mg/dL (ref <200)
HDL Cholesterol by NMR: 53 mg/dL (ref >=40)
HDL Particle Number: 34.8 umol/L (ref >=30.5)
LDL Particle Number: 876 nmol/L (ref <1000)
LDL SIZE: 20.5 nm (ref >20.5)
LDLC SERPL CALC-MCNC: 75 mg/dL (ref <100)
LP-IR SCORE: 47 — AB (ref <=45)
SMALL LDL PARTICLE NUMBER: 352 nmol/L (ref <=527)
Triglycerides by NMR: 134 mg/dL (ref <150)

## 2013-12-15 LAB — CMP14+EGFR
A/G RATIO: 1.8 (ref 1.1–2.5)
ALT: 15 IU/L (ref 0–32)
AST: 12 IU/L (ref 0–40)
Albumin: 4.2 g/dL (ref 3.5–4.7)
Alkaline Phosphatase: 102 IU/L (ref 39–117)
BILIRUBIN TOTAL: 0.6 mg/dL (ref 0.0–1.2)
BUN/Creatinine Ratio: 15 (ref 11–26)
BUN: 18 mg/dL (ref 8–27)
CHLORIDE: 99 mmol/L (ref 97–108)
CO2: 23 mmol/L (ref 18–29)
Calcium: 9.4 mg/dL (ref 8.7–10.3)
Creatinine, Ser: 1.21 mg/dL — ABNORMAL HIGH (ref 0.57–1.00)
GFR calc non Af Amer: 42 mL/min/{1.73_m2} — ABNORMAL LOW (ref >59)
GFR, EST AFRICAN AMERICAN: 49 mL/min/{1.73_m2} — AB (ref >59)
GLUCOSE: 153 mg/dL — AB (ref 65–99)
Globulin, Total: 2.3 g/dL (ref 1.5–4.5)
POTASSIUM: 4.4 mmol/L (ref 3.5–5.2)
SODIUM: 141 mmol/L (ref 134–144)
Total Protein: 6.5 g/dL (ref 6.0–8.5)

## 2013-12-19 ENCOUNTER — Telehealth: Payer: Self-pay | Admitting: Family Medicine

## 2013-12-19 NOTE — Telephone Encounter (Signed)
Message copied by Waverly Ferrari on Mon Dec 19, 2013 10:48 AM ------      Message from: [name]      Created: Thu Dec 15, 2013 12:16 PM       Hgba1c discussed at appointment      Kidney and liver function stable- creatine increasing- no NSAIDs      Cj=holesterol looks great      Continue current meds- low fat diet and exercise and recheck in 3 months             ------

## 2013-12-20 ENCOUNTER — Other Ambulatory Visit: Payer: Medicare Other

## 2013-12-20 NOTE — Telephone Encounter (Signed)
Thyroid panel was not collected and it's too late to add it. Patient will return today to have this drawn.

## 2013-12-20 NOTE — Progress Notes (Signed)
Pt came in for labs from last visit with mmm

## 2013-12-20 NOTE — Telephone Encounter (Signed)
Pt wants to know if MMM has decided what strength of synthroid she is to take.  She is almost out of med and needs 30 day supply presc to. [facility] in [place] and also a 90 day supply presc. To mail order pharmacy [facility].  Call pt at [phone].

## 2013-12-21 LAB — THYROID PANEL WITH TSH
Free Thyroxine Index: 1.6 (ref 1.2–4.9)
T3 UPTAKE RATIO: 26 % (ref 24–39)
T4 TOTAL: 6.3 ug/dL (ref 4.5–12.0)
TSH: 4.23 u[IU]/mL (ref 0.450–4.500)

## 2013-12-23 ENCOUNTER — Telehealth: Payer: Self-pay | Admitting: Nurse Practitioner

## 2013-12-23 MED ORDER — LEVOTHYROXINE SODIUM 50 MCG PO TABS: 50.0000 ug | ORAL_TABLET | Freq: Every day | ORAL | Status: DC

## 2013-12-23 NOTE — Telephone Encounter (Signed)
rx sent to pharmacy

## 2013-12-26 ENCOUNTER — Other Ambulatory Visit: Payer: Self-pay | Admitting: Nurse Practitioner

## 2013-12-26 ENCOUNTER — Telehealth: Payer: Self-pay | Admitting: *Deleted

## 2013-12-26 ENCOUNTER — Telehealth: Payer: Self-pay | Admitting: Nurse Practitioner

## 2013-12-26 MED ORDER — LEVOTHYROXINE SODIUM 50 MCG PO TABS: 50.0000 ug | ORAL_TABLET | Freq: Every day | ORAL | Status: DC

## 2013-12-26 NOTE — Telephone Encounter (Signed)
Tried calling patient on results number busy... Please send in med

## 2013-12-26 NOTE — Telephone Encounter (Signed)
Thyroid panel was normal- rx sent to pharmacy

## 2013-12-26 NOTE — Telephone Encounter (Signed)
Aware, labs fine and thyroid medication sent in.

## 2013-12-27 ENCOUNTER — Encounter: Payer: Self-pay | Admitting: General Practice

## 2013-12-27 ENCOUNTER — Ambulatory Visit (INDEPENDENT_AMBULATORY_CARE_PROVIDER_SITE_OTHER): Payer: Medicare Other | Admitting: General Practice

## 2013-12-27 VITALS — BP 135/70 | HR 70 | Temp 98.7°F | Ht 67.0 in | Wt 180.2 lb

## 2013-12-27 DIAGNOSIS — J0100 Acute maxillary sinusitis, unspecified: Secondary | ICD-10-CM

## 2013-12-27 MED ORDER — AZITHROMYCIN 250 MG PO TABS: ORAL_TABLET | ORAL | Status: DC

## 2013-12-27 NOTE — Patient Instructions (Addendum)

## 2013-12-27 NOTE — Progress Notes (Signed)
   Subjective:    Patient ID: Katie [name], female    DOB: October 29, 1931, 80 y.o.   MRN: [id number]  Cough This is a new problem. The current episode started yesterday. The problem has been unchanged. The cough is non-productive. Associated symptoms include headaches, nasal congestion and postnasal drip. Pertinent negatives include no chest pain, chills, fever, rhinorrhea, shortness of breath or wheezing. The symptoms are aggravated by lying down. Her past medical history is significant for bronchitis and pneumonia. There is no history of asthma or COPD.  Sore Throat  This is a new problem. The current episode started yesterday. The problem has been unchanged. Neither side of throat is experiencing more pain than the other. There has been no fever. The pain is at a severity of 2/10. The pain is mild. Associated symptoms include congestion, coughing and headaches. Pertinent negatives include no shortness of breath. She has tried nothing for the symptoms.      Review of Systems  Constitutional: Negative for fever and chills.  HENT: Positive for congestion, postnasal drip and sinus pressure. Negative for rhinorrhea.   Respiratory: Positive for cough. Negative for chest tightness, shortness of breath and wheezing.   Cardiovascular: Negative for chest pain.  Neurological: Positive for headaches. Negative for dizziness and weakness.       Objective:   Physical Exam  Constitutional: She is oriented to person, place, and time. She appears well-developed and well-nourished.  HENT:  Head: Normocephalic and atraumatic.  Right Ear: External ear normal.  Left Ear: External ear normal.  Nose: Right sinus exhibits maxillary sinus tenderness. Left sinus exhibits maxillary sinus tenderness.  Mouth/Throat: Oropharynx is clear and moist.  Cardiovascular: Normal rate, regular rhythm and normal heart sounds.   Pulmonary/Chest: Effort normal and breath sounds normal. No respiratory distress. She exhibits no  tenderness.  Neurological: She is alert and oriented to person, place, and time.  Skin: Skin is warm and dry.  Psychiatric: She has a normal mood and affect.          Assessment & Plan:  1. Sinusitis, acute maxillary - azithromycin (ZITHROMAX) 250 MG tablet; Take as directed  Dispense: 6 tablet; Refill: 0 -patient education provided and discussed on sinusitis -RTO if symptoms worsen or unresolved -Patient verbalized understanding Erby Pian, FNP-C

## 2014-03-15 ENCOUNTER — Ambulatory Visit (INDEPENDENT_AMBULATORY_CARE_PROVIDER_SITE_OTHER): Payer: Medicare Other

## 2014-03-15 ENCOUNTER — Ambulatory Visit (INDEPENDENT_AMBULATORY_CARE_PROVIDER_SITE_OTHER): Payer: Medicare Other | Admitting: Nurse Practitioner

## 2014-03-15 ENCOUNTER — Encounter: Payer: Self-pay | Admitting: Nurse Practitioner

## 2014-03-15 VITALS — BP 117/65 | HR 73 | Temp 97.7°F | Ht 67.0 in | Wt 179.0 lb

## 2014-03-15 DIAGNOSIS — E039 Hypothyroidism, unspecified: Secondary | ICD-10-CM

## 2014-03-15 DIAGNOSIS — E876 Hypokalemia: Secondary | ICD-10-CM

## 2014-03-15 DIAGNOSIS — R600 Localized edema: Secondary | ICD-10-CM

## 2014-03-15 DIAGNOSIS — I10 Essential (primary) hypertension: Secondary | ICD-10-CM

## 2014-03-15 DIAGNOSIS — R609 Edema, unspecified: Secondary | ICD-10-CM

## 2014-03-15 DIAGNOSIS — E119 Type 2 diabetes mellitus without complications: Secondary | ICD-10-CM

## 2014-03-15 DIAGNOSIS — E785 Hyperlipidemia, unspecified: Secondary | ICD-10-CM

## 2014-03-15 DIAGNOSIS — K219 Gastro-esophageal reflux disease without esophagitis: Secondary | ICD-10-CM

## 2014-03-15 LAB — POCT GLYCOSYLATED HEMOGLOBIN (HGB A1C): Hemoglobin A1C: 6.6

## 2014-03-15 MED ORDER — FUROSEMIDE 40 MG PO TABS: 40.0000 mg | ORAL_TABLET | Freq: Every day | ORAL | Status: DC

## 2014-03-15 MED ORDER — LEVOTHYROXINE SODIUM 50 MCG PO TABS: 50.0000 ug | ORAL_TABLET | Freq: Every day | ORAL | Status: DC

## 2014-03-15 MED ORDER — POTASSIUM CHLORIDE CRYS ER 20 MEQ PO TBCR: 20.0000 meq | EXTENDED_RELEASE_TABLET | Freq: Every day | ORAL | Status: DC

## 2014-03-15 MED ORDER — LOVASTATIN 40 MG PO TABS: 80.0000 mg | ORAL_TABLET | Freq: Every day | ORAL | Status: DC

## 2014-03-15 MED ORDER — BENAZEPRIL HCL 40 MG PO TABS: 40.0000 mg | ORAL_TABLET | Freq: Every day | ORAL | Status: DC

## 2014-03-15 MED ORDER — OMEPRAZOLE 40 MG PO CPDR: 40.0000 mg | DELAYED_RELEASE_CAPSULE | Freq: Every day | ORAL | Status: DC

## 2014-03-15 NOTE — Progress Notes (Signed)
Subjective:    Patient ID: Katie [name], female    DOB: [birth year], 80 y.o.   MRN: [id number]  Patient here today for follow up of chronic medical problems. Her only complaint today is swelling of lower ext that started about 2 months- denies SOB. Usually goes down during the night.  Hypertension This is a chronic problem. The current episode started more than 1 year ago. The problem is unchanged. The problem is controlled (patient didn't take her blood pressure meds this AM.). Pertinent negatives include no chest pain, headaches, malaise/fatigue, neck pain, palpitations, peripheral edema or shortness of breath. There are no associated agents to hypertension. Risk factors for coronary artery disease include dyslipidemia, obesity and post-menopausal state. Past treatments include ACE inhibitors, calcium channel blockers and diuretics. The current treatment provides significant improvement. Compliance problems include exercise and diet.  Hypertensive end-organ damage includes a thyroid problem.  Hyperlipidemia This is a chronic problem. The current episode started more than 1 year ago. The problem is controlled. Recent lipid tests were reviewed and are normal. Exacerbating diseases include diabetes, hypothyroidism and obesity. Pertinent negatives include no chest pain, leg pain, myalgias or shortness of breath. Current antihyperlipidemic treatment includes statins. The current treatment provides moderate improvement of lipids. Compliance problems include adherence to diet and adherence to exercise.  Risk factors for coronary artery disease include diabetes mellitus, hypertension, obesity and post-menopausal.  Diabetes She presents for her follow-up diabetic visit. She has type 2 diabetes mellitus. No MedicAlert identification noted. The initial diagnosis of diabetes was made 5 years ago. Her disease course has been stable. There are no hypoglycemic associated symptoms. Pertinent negatives for hypoglycemia  include no headaches. There are no diabetic associated symptoms. Pertinent negatives for diabetes include no chest pain, no polydipsia, no polyphagia, no polyuria, no visual change, no weakness and no weight loss. There are no hypoglycemic complications. Symptoms are stable. There are no diabetic complications. Risk factors for coronary artery disease include hypertension, obesity and post-menopausal. Current diabetic treatment includes diet. She is compliant with treatment most of the time. Her weight is stable. She is following a diabetic diet. When asked about meal planning, she reported none. She has not had a previous visit with a dietician. She rarely participates in exercise. There is no change in her home blood glucose trend. (Patent hasn't been checking blood sugar at home.) An ACE inhibitor/angiotensin II receptor blocker is being taken. She does not see a podiatrist.Eye exam is not current (2011- patient told needs appointment.).  Thyroid Problem Presents for follow-up visit. Symptoms include anxiety (at times). Patient reports no depressed mood, heat intolerance, leg swelling, palpitations, visual change or weight loss. The symptoms have been stable (patient said that she is uppose to be  on 54mg of levothyroxin but was rx 576m- no where  on electronic chart does it say 7524m). Her past medical history is significant for diabetes and hyperlipidemia.  hypokalemia klor con 53m21mno c/o lower ext cramping GERD omperazole working well to keep symptoms under control Constipation Patient tried amitiza in the past and it really helped would like to try again    Review of Systems  Constitutional: Negative for weight loss and malaise/fatigue.  Respiratory: Negative for shortness of breath.   Cardiovascular: Negative for chest pain and palpitations.  Endocrine: Negative for heat intolerance, polydipsia, polyphagia and polyuria.  Musculoskeletal: Negative for myalgias and neck pain.   Neurological: Negative for weakness and headaches.  All other systems reviewed and are negative.  Objective:   Physical Exam  Constitutional: She is oriented to person, place, and time. She appears well-developed and well-nourished.  HENT:  Nose: Nose normal.  Mouth/Throat: Oropharynx is clear and moist.  Eyes: EOM are normal.  Neck: Trachea normal, normal range of motion and full passive range of motion without pain. Neck supple. No JVD present. Carotid bruit is not present. No thyromegaly present.  Cardiovascular: Normal rate, regular rhythm, normal heart sounds and intact distal pulses.  Exam reveals no gallop and no friction rub.   No murmur heard. Pulmonary/Chest: Effort normal and breath sounds normal.  Abdominal: Soft. Bowel sounds are normal. She exhibits no distension and no mass. There is no tenderness.  Musculoskeletal: Normal range of motion. She exhibits edema (1+ peripherla edema bil).  Lymphadenopathy:    She has no cervical adenopathy.  Neurological: She is alert and oriented to person, place, and time. She has normal reflexes.  Skin: Skin is warm and dry.  Psychiatric: She has a normal mood and affect. Her behavior is normal. Judgment and thought content normal.   BP 117/65  Pulse 73  Temp(Src) 97.7 F (36.5 C) (Oral)  Ht '5\' 7"'  (1.702 m)  Wt 179 lb (81.194 kg)  BMI 28.03 kg/m2 See diabetic foot exam. Results for orders placed in visit on 03/15/14  POCT GLYCOSYLATED HEMOGLOBIN (HGB A1C)      Result Value Ref Range   Hemoglobin A1C 6.6     ekg- nsr-Mary-Margaret Hassell Done, FNP Chest x ray- normal-Preliminary reading by Ronnald Collum, FNP  [facility]        Assessment & Plan:   1. Diabetes mellitus type 2, diet-controlled   2. Hyperlipidemia   3. Essential hypertension   4. Hypothyroidism, unspecified hypothyroidism type   5. Hypokalemia   6. Gastroesophageal reflux disease, esophagitis presence not specified   7. Peripheral edema    Orders Placed This  Encounter  Procedures  . DG Chest 2 View    Standing Status: Future     Number of Occurrences:      Standing Expiration Date: 05/15/2015    Order Specific Question:  Reason for Exam (SYMPTOM  OR DIAGNOSIS REQUIRED)    Answer:  peripheral edema    Order Specific Question:  Preferred imaging location?    Answer:  Internal  . CMP14+EGFR  . NMR, lipoprofile  . Thyroid Panel With TSH  . POCT glycosylated hemoglobin (Hb A1C)  . EKG 12-Lead   Meds ordered this encounter  Medications  . cyclobenzaprine (FLEXERIL) 5 MG tablet    Sig: Take 5 mg by mouth 3 (three) times daily as needed for muscle spasms.  . benazepril (LOTENSIN) 40 MG tablet    Sig: Take 1 tablet (40 mg total) by mouth daily.    Dispense:  90 tablet    Refill:  1    Order Specific Question:  Supervising Provider    Answer:  Chipper Herb [[id number]]  . furosemide (LASIX) 40 MG tablet    Sig: Take 1 tablet (40 mg total) by mouth daily.    Dispense:  90 tablet    Refill:  1    Order Specific Question:  Supervising Provider    Answer:  Chipper Herb [[id number]]  . levothyroxine (SYNTHROID, LEVOTHROID) 50 MCG tablet    Sig: Take 1 tablet (50 mcg total) by mouth daily before breakfast.    Dispense:  90 tablet    Refill:  1    Order Specific Question:  Supervising Provider  Answer:  Chipper Herb [[id number]]  . lovastatin (MEVACOR) 40 MG tablet    Sig: Take 2 tablets (80 mg total) by mouth at bedtime. Take 2 tablets by mouth at bedtime daily    Dispense:  180 tablet    Refill:  1    Order Specific Question:  Supervising Provider    Answer:  Chipper Herb [[id number]]  . omeprazole (PRILOSEC) 40 MG capsule    Sig: Take 1 capsule (40 mg total) by mouth daily.    Dispense:  90 capsule    Refill:  1    Order Specific Question:  Supervising Provider    Answer:  Chipper Herb [[id number]]  . potassium chloride SA (KLOR-CON M20) 20 MEQ tablet    Sig: Take 1 tablet (20 mEq total) by mouth daily.    Dispense:  90 tablet    Refill:  1     Order Specific Question:  Supervising Provider    Answer:  Chipper Herb [[id number]]   Increased benazepril to 44m daily and decreased amlodipine to 569mdaily to see if will help with swelling Continue to elevate legs when sitting. Labs pending Health maintenance reviewed Diet and exercise encouraged Continue all meds Follow up  In 3 months   MaHarrisonFNP

## 2014-03-16 LAB — THYROID PANEL WITH TSH
Free Thyroxine Index: 1.9 (ref 1.2–4.9)
T3 Uptake Ratio: 28 % (ref 24–39)
T4 TOTAL: 6.9 ug/dL (ref 4.5–12.0)
TSH: 3.8 u[IU]/mL (ref 0.450–4.500)

## 2014-03-16 LAB — CMP14+EGFR
A/G RATIO: 1.8 (ref 1.1–2.5)
ALK PHOS: 107 IU/L (ref 39–117)
ALT: 14 IU/L (ref 0–32)
AST: 13 IU/L (ref 0–40)
Albumin: 4.4 g/dL (ref 3.5–4.7)
BUN / CREAT RATIO: 17 (ref 11–26)
BUN: 23 mg/dL (ref 8–27)
CO2: 23 mmol/L (ref 18–29)
Calcium: 9.2 mg/dL (ref 8.7–10.3)
Chloride: 100 mmol/L (ref 97–108)
Creatinine, Ser: 1.33 mg/dL — ABNORMAL HIGH (ref 0.57–1.00)
GFR calc Af Amer: 44 mL/min/{1.73_m2} — ABNORMAL LOW (ref >59)
GFR, EST NON AFRICAN AMERICAN: 38 mL/min/{1.73_m2} — AB (ref >59)
Globulin, Total: 2.4 g/dL (ref 1.5–4.5)
Glucose: 129 mg/dL — ABNORMAL HIGH (ref 65–99)
POTASSIUM: 4.5 mmol/L (ref 3.5–5.2)
SODIUM: 140 mmol/L (ref 134–144)
Total Bilirubin: 0.6 mg/dL (ref 0.0–1.2)
Total Protein: 6.8 g/dL (ref 6.0–8.5)

## 2014-03-16 LAB — NMR, LIPOPROFILE
Cholesterol: 155 mg/dL (ref 100–199)
HDL Cholesterol by NMR: 60 mg/dL (ref >39)
HDL PARTICLE NUMBER: 37 umol/L (ref >=30.5)
LDL Particle Number: 931 nmol/L (ref <1000)
LDL SIZE: 20.5 nm (ref >20.5)
LDLC SERPL CALC-MCNC: 80 mg/dL (ref 0–99)
LP-IR SCORE: 32 (ref <=45)
Small LDL Particle Number: 391 nmol/L (ref <=527)
Triglycerides by NMR: 77 mg/dL (ref 0–149)

## 2014-05-16 ENCOUNTER — Ambulatory Visit: Payer: Medicare Other

## 2014-05-18 ENCOUNTER — Ambulatory Visit (INDEPENDENT_AMBULATORY_CARE_PROVIDER_SITE_OTHER): Payer: Medicare Other

## 2014-05-18 DIAGNOSIS — Z23 Encounter for immunization: Secondary | ICD-10-CM

## 2014-05-30 ENCOUNTER — Telehealth: Payer: Self-pay | Admitting: Nurse Practitioner

## 2014-05-30 DIAGNOSIS — M545 Low back pain, unspecified: Secondary | ICD-10-CM

## 2014-05-30 MED ORDER — TRAMADOL HCL 50 MG PO TABS: 50.0000 mg | ORAL_TABLET | Freq: Every day | ORAL | Status: DC

## 2014-05-30 NOTE — Telephone Encounter (Signed)
rx ready for pickup 

## 2014-05-31 NOTE — Telephone Encounter (Signed)
Patient aware to pick up 

## 2014-07-06 ENCOUNTER — Ambulatory Visit (INDEPENDENT_AMBULATORY_CARE_PROVIDER_SITE_OTHER): Payer: Medicare Other | Admitting: Nurse Practitioner

## 2014-07-06 ENCOUNTER — Encounter: Payer: Self-pay | Admitting: Nurse Practitioner

## 2014-07-06 VITALS — BP 145/76 | HR 56 | Temp 97.2°F | Ht 67.0 in | Wt 185.0 lb

## 2014-07-06 DIAGNOSIS — E119 Type 2 diabetes mellitus without complications: Secondary | ICD-10-CM

## 2014-07-06 DIAGNOSIS — K5900 Constipation, unspecified: Secondary | ICD-10-CM

## 2014-07-06 DIAGNOSIS — M549 Dorsalgia, unspecified: Secondary | ICD-10-CM

## 2014-07-06 DIAGNOSIS — I10 Essential (primary) hypertension: Secondary | ICD-10-CM

## 2014-07-06 DIAGNOSIS — G8929 Other chronic pain: Secondary | ICD-10-CM

## 2014-07-06 DIAGNOSIS — E039 Hypothyroidism, unspecified: Secondary | ICD-10-CM

## 2014-07-06 DIAGNOSIS — R600 Localized edema: Secondary | ICD-10-CM

## 2014-07-06 DIAGNOSIS — M545 Low back pain, unspecified: Secondary | ICD-10-CM

## 2014-07-06 DIAGNOSIS — E785 Hyperlipidemia, unspecified: Secondary | ICD-10-CM

## 2014-07-06 DIAGNOSIS — K219 Gastro-esophageal reflux disease without esophagitis: Secondary | ICD-10-CM

## 2014-07-06 DIAGNOSIS — E876 Hypokalemia: Secondary | ICD-10-CM

## 2014-07-06 DIAGNOSIS — R609 Edema, unspecified: Secondary | ICD-10-CM | POA: Insufficient documentation

## 2014-07-06 LAB — POCT GLYCOSYLATED HEMOGLOBIN (HGB A1C): Hemoglobin A1C: 7

## 2014-07-06 MED ORDER — LOVASTATIN 40 MG PO TABS: 80.0000 mg | ORAL_TABLET | Freq: Every day | ORAL | Status: DC

## 2014-07-06 MED ORDER — FUROSEMIDE 40 MG PO TABS: 40.0000 mg | ORAL_TABLET | Freq: Every day | ORAL | Status: DC

## 2014-07-06 MED ORDER — AMLODIPINE BESYLATE 10 MG PO TABS: 10.0000 mg | ORAL_TABLET | Freq: Every day | ORAL | Status: DC

## 2014-07-06 MED ORDER — LEVOTHYROXINE SODIUM 50 MCG PO TABS: 50.0000 ug | ORAL_TABLET | Freq: Every day | ORAL | Status: DC

## 2014-07-06 MED ORDER — OMEPRAZOLE 40 MG PO CPDR: 40.0000 mg | DELAYED_RELEASE_CAPSULE | Freq: Every day | ORAL | Status: DC

## 2014-07-06 MED ORDER — TRAMADOL HCL 50 MG PO TABS: 50.0000 mg | ORAL_TABLET | Freq: Every day | ORAL | Status: DC

## 2014-07-06 MED ORDER — POTASSIUM CHLORIDE CRYS ER 20 MEQ PO TBCR: 20.0000 meq | EXTENDED_RELEASE_TABLET | Freq: Every day | ORAL | Status: DC

## 2014-07-06 MED ORDER — BENAZEPRIL HCL 40 MG PO TABS: 40.0000 mg | ORAL_TABLET | Freq: Every day | ORAL | Status: DC

## 2014-07-06 MED ORDER — LUBIPROSTONE 8 MCG PO CAPS: 8.0000 ug | ORAL_CAPSULE | Freq: Two times a day (BID) | ORAL | Status: DC

## 2014-07-06 MED ORDER — ETODOLAC 400 MG PO TABS: 400.0000 mg | ORAL_TABLET | Freq: Two times a day (BID) | ORAL | Status: DC

## 2014-07-06 NOTE — Addendum Note (Signed)
Addended by: [name] on: 07/06/2014 11:53 AM   Modules accepted: Orders

## 2014-07-06 NOTE — Addendum Note (Signed)
Addended by: Pollyann Kennedy F on: 07/06/2014 04:13 PM   Modules accepted: Orders

## 2014-07-06 NOTE — Patient Instructions (Signed)
Diabetes and Foot Care Diabetes may cause you to have problems because of poor blood supply (circulation) to your feet and legs. This may cause the skin on your feet to become thinner, break easier, and heal more slowly. Your skin may become dry, and the skin may peel and crack. You may also have nerve damage in your legs and feet causing decreased feeling in them. You may not notice minor injuries to your feet that could lead to infections or more serious problems. Taking care of your feet is one of the most important things you can do for yourself.  HOME CARE INSTRUCTIONS  Wear shoes at all times, even in the house. Do not go barefoot. Bare feet are easily injured.  Check your feet daily for blisters, cuts, and redness. If you cannot see the bottom of your feet, use a mirror or ask someone for help.  Wash your feet with warm water (do not use hot water) and mild soap. Then pat your feet and the areas between your toes until they are completely dry. Do not soak your feet as this can dry your skin.  Apply a moisturizing lotion or petroleum jelly (that does not contain alcohol and is unscented) to the skin on your feet and to dry, brittle toenails. Do not apply lotion between your toes.  Trim your toenails straight across. Do not dig under them or around the cuticle. File the edges of your nails with an emery board or nail file.  Do not cut corns or calluses or try to remove them with medicine.  Wear clean socks or stockings every day. Make sure they are not too tight. Do not wear knee-high stockings since they may decrease blood flow to your legs.  Wear shoes that fit properly and have enough cushioning. To break in new shoes, wear them for just a few hours a day. This prevents you from injuring your feet. Always look in your shoes before you put them on to be sure there are no objects inside.  Do not cross your legs. This may decrease the blood flow to your feet.  If you find a minor scrape,  cut, or break in the skin on your feet, keep it and the skin around it clean and dry. These areas may be cleansed with mild soap and water. Do not cleanse the area with peroxide, alcohol, or iodine.  When you remove an adhesive bandage, be sure not to damage the skin around it.  If you have a wound, look at it several times a day to make sure it is healing.  Do not use heating pads or hot water bottles. They may burn your skin. If you have lost feeling in your feet or legs, you may not know it is happening until it is too late.  Make sure your health care provider performs a complete foot exam at least annually or more often if you have foot problems. Report any cuts, sores, or bruises to your health care provider immediately. SEEK MEDICAL CARE IF:   You have an injury that is not healing.  You have cuts or breaks in the skin.  You have an ingrown nail.  You notice redness on your legs or feet.  You feel burning or tingling in your legs or feet.  You have pain or cramps in your legs and feet.  Your legs or feet are numb.  Your feet always feel cold. SEEK IMMEDIATE MEDICAL CARE IF:   There is increasing redness,   swelling, or pain in or around a wound.  There is a red line that goes up your leg.  Pus is coming from a wound.  You develop a fever or as directed by your health care provider.  You notice a bad smell coming from an ulcer or wound. Document Released: 08/01/2000 Document Revised: 04/06/2013 Document Reviewed: 01/11/2013 [employer] Patient Information 2015 [employer]. This information is not intended to replace advice given to you by your health care provider. Make sure you discuss any questions you have with your health care provider.  

## 2014-07-06 NOTE — Progress Notes (Signed)
Subjective:    Patient ID: Katie [name], female    DOB: 1932/07/15, 80 y.o.   MRN: [id number]  Hypertension This is a chronic problem. The current episode started more than 1 year ago. The problem is unchanged. The problem is controlled. Pertinent negatives include no chest pain, headaches, neck pain, palpitations, peripheral edema or shortness of breath. Risk factors for coronary artery disease include sedentary lifestyle, post-menopausal state, dyslipidemia and diabetes mellitus. Past treatments include ACE inhibitors, calcium channel blockers and diuretics. The current treatment provides moderate improvement. There are no compliance problems.  Hypertensive end-organ damage includes a thyroid problem.  Hyperlipidemia This is a chronic problem. The current episode started more than 1 year ago. The problem is uncontrolled. Recent lipid tests were reviewed and are high. Exacerbating diseases include diabetes and hypothyroidism. She has no history of obesity. Pertinent negatives include no chest pain, myalgias or shortness of breath. Current antihyperlipidemic treatment includes statins. The current treatment provides moderate improvement of lipids. Compliance problems include adherence to diet and adherence to exercise.  Risk factors for coronary artery disease include dyslipidemia, family history, hypertension and post-menopausal.  Diabetes She presents for her follow-up diabetic visit. She has type 2 diabetes mellitus. No MedicAlert identification noted. Her disease course has been stable. Pertinent negatives for hypoglycemia include no headaches. Pertinent negatives for diabetes include no chest pain, no visual change and no weakness. Symptoms are stable. Risk factors for coronary artery disease include diabetes mellitus, dyslipidemia, family history, hypertension and post-menopausal. Current diabetic treatment includes diet. She is compliant with treatment most of the time. When asked about meal  planning, she reported none. She has not had a previous visit with a dietitian. She rarely participates in exercise. Her breakfast blood glucose is taken between 8-9 am. Her breakfast blood glucose range is generally 140-180 mg/dl. (Patient does not check daily blood sugars) An ACE inhibitor/angiotensin II receptor blocker is being taken. She does not see a podiatrist.Eye exam is not current.  Thyroid Problem Presents for follow-up (hypothyroidism) visit. Symptoms include constipation. Patient reports no palpitations or visual change. The symptoms have been stable. Her past medical history is significant for diabetes and hyperlipidemia.  GERD Omeprazole keeps symptoms under control Hypokalemia Potassium supplements daily- no c/o lower ext cramping Peripheral edema Lasix keeps under control- since decreased amlodopine to 5 mg daily has gotten much better Chronic back pain Flexeril, lodine and ultram as needed- somedays she does not have to take at all. 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 Constipation  Was rx amitiza but never took- says that she stays constipated- has to take a laxative in order to go- tried linzess but that gave her diarrhea.    Review of Systems  Constitutional: Negative.   Respiratory: Negative for shortness of breath.   Cardiovascular: Negative for chest pain and palpitations.  Gastrointestinal: Positive for constipation.  Genitourinary: Negative.   Musculoskeletal: Negative for myalgias and neck pain.  Neurological: Negative for weakness and headaches.  Psychiatric/Behavioral: Negative.   All other systems reviewed and are negative.      Objective:   Physical Exam  Constitutional: She is oriented to person, place, and time. She appears well-developed and well-nourished.  HENT:  Nose: Nose normal.  Mouth/Throat: Oropharynx is clear and moist.  Eyes: EOM are normal.  Neck: Trachea normal, normal range of motion and full passive range of motion  without pain. Neck supple. No JVD present. Carotid bruit is not present. No thyromegaly present.  Cardiovascular: Normal rate, regular rhythm, normal heart sounds and intact distal pulses.  Exam reveals no gallop and no friction rub.   No murmur heard. Pulmonary/Chest: Effort normal and breath sounds normal.  Abdominal: Soft. Bowel sounds are normal. She exhibits no distension and no mass. There is no tenderness.  Musculoskeletal: Normal range of motion.  Lymphadenopathy:    She has no cervical adenopathy.  Neurological: She is alert and oriented to person, place, and time. She has normal reflexes.  Skin: Skin is warm and dry.  Psychiatric: She has a normal mood and affect. Her behavior is normal. Judgment and thought content normal.   BP 145/76 mmHg  Pulse 56  Temp(Src) 97.2 F (36.2 C) (Oral)  Ht '5\' 7"'  (1.702 m)  Wt 185 lb (83.915 kg)  BMI 28.97 kg/m2  Results for orders placed or performed in visit on 07/06/14  POCT glycosylated hemoglobin (Hb A1C)  Result Value Ref Range   Hemoglobin A1C 7.0           Assessment & Plan:  1. Diabetes mellitus type 2, diet-controlled Watch carbs in diet - POCT glycosylated hemoglobin (Hb A1C) - POCT UA - Microalbumin  2. Hyperlipidemia Low fat diet - NMR, lipoprofile - lovastatin (MEVACOR) 40 MG tablet; Take 2 tablets (80 mg total) by mouth at bedtime. Take 2 tablets by mouth at bedtime daily  Dispense: 180 tablet; Refill: 1  3. Essential hypertension Do not add salt to diet - CMP14+EGFR - benazepril (LOTENSIN) 40 MG tablet; Take 1 tablet (40 mg total) by mouth daily.  Dispense: 90 tablet; Refill: 1 - amLODipine (NORVASC) 10 MG tablet; Take 1 tablet (10 mg total) by mouth daily.  Dispense: 90 tablet; Refill: 1  4. Peripheral edema Elevate legs when sitting - furosemide (LASIX) 40 MG tablet; Take 1 tablet (40 mg total) by mouth daily.  Dispense: 90 tablet; Refill: 1  5. Gastroesophageal reflux disease, esophagitis presence not  specified Avoid spicy foods Do not eat 2 hours prior to bedtime - omeprazole (PRILOSEC) 40 MG capsule; Take 1 capsule (40 mg total) by mouth daily.  Dispense: 90 capsule; Refill: 1  6. Constipation, unspecified constipation type Increase fiber in diet - lubiprostone (AMITIZA) 8 MCG capsule; Take 1 capsule (8 mcg total) by mouth 2 (two) times daily with a meal.  Dispense: 180 capsule; Refill: 1  7. Hypothyroidism, unspecified hypothyroidism type - levothyroxine (SYNTHROID, LEVOTHROID) 50 MCG tablet; Take 1 tablet (50 mcg total) by mouth daily before breakfast.  Dispense: 90 tablet; Refill: 1  8. Hypokalemia - potassium chloride SA (KLOR-CON M20) 20 MEQ tablet; Take 1 tablet (20 mEq total) by mouth daily.  Dispense: 90 tablet; Refill: 1  9. Chronic back pain Avoid lifting when hurting - etodolac (LODINE) 400 MG tablet; Take 1 tablet (400 mg total) by mouth 2 (two) times daily.  Dispense: 90 tablet; Refill: 1    Labs pending Health maintenance reviewed Diet and exercise encouraged Continue all meds Follow up  In 3 months   [place], FNP

## 2014-07-07 LAB — NMR, LIPOPROFILE
Cholesterol: 153 mg/dL (ref 100–199)
HDL CHOLESTEROL BY NMR: 51 mg/dL (ref >39)
HDL PARTICLE NUMBER: 34 umol/L (ref >=30.5)
LDL Particle Number: 1202 nmol/L — ABNORMAL HIGH (ref <1000)
LDL Size: 20.3 nm (ref >20.5)
LDL-C: 74 mg/dL (ref 0–99)
LP-IR SCORE: 64 — AB (ref <=45)
Small LDL Particle Number: 716 nmol/L — ABNORMAL HIGH (ref <=527)
Triglycerides by NMR: 139 mg/dL (ref 0–149)

## 2014-07-07 LAB — CMP14+EGFR
ALK PHOS: 100 IU/L (ref 39–117)
ALT: 12 IU/L (ref 0–32)
AST: 15 IU/L (ref 0–40)
Albumin/Globulin Ratio: 1.7 (ref 1.1–2.5)
Albumin: 4.3 g/dL (ref 3.5–4.7)
BUN / CREAT RATIO: 14 (ref 11–26)
BUN: 17 mg/dL (ref 8–27)
CALCIUM: 9.6 mg/dL (ref 8.7–10.3)
CO2: 25 mmol/L (ref 18–29)
Chloride: 98 mmol/L (ref 97–108)
Creatinine, Ser: 1.18 mg/dL — ABNORMAL HIGH (ref 0.57–1.00)
GFR calc Af Amer: 50 mL/min/{1.73_m2} — ABNORMAL LOW (ref >59)
GFR calc non Af Amer: 44 mL/min/{1.73_m2} — ABNORMAL LOW (ref >59)
Globulin, Total: 2.5 g/dL (ref 1.5–4.5)
Glucose: 162 mg/dL — ABNORMAL HIGH (ref 65–99)
POTASSIUM: 4.3 mmol/L (ref 3.5–5.2)
SODIUM: 139 mmol/L (ref 134–144)
Total Bilirubin: 0.5 mg/dL (ref 0.0–1.2)
Total Protein: 6.8 g/dL (ref 6.0–8.5)

## 2014-10-18 ENCOUNTER — Encounter: Payer: Self-pay | Admitting: Nurse Practitioner

## 2014-10-18 ENCOUNTER — Ambulatory Visit (INDEPENDENT_AMBULATORY_CARE_PROVIDER_SITE_OTHER): Payer: Medicare Other | Admitting: Nurse Practitioner

## 2014-10-18 VITALS — BP 140/68 | HR 65 | Temp 97.9°F | Ht 67.0 in | Wt 185.0 lb

## 2014-10-18 DIAGNOSIS — E785 Hyperlipidemia, unspecified: Secondary | ICD-10-CM

## 2014-10-18 DIAGNOSIS — R609 Edema, unspecified: Secondary | ICD-10-CM | POA: Diagnosis not present

## 2014-10-18 DIAGNOSIS — M8580 Other specified disorders of bone density and structure, unspecified site: Secondary | ICD-10-CM

## 2014-10-18 DIAGNOSIS — K219 Gastro-esophageal reflux disease without esophagitis: Secondary | ICD-10-CM | POA: Diagnosis not present

## 2014-10-18 DIAGNOSIS — I10 Essential (primary) hypertension: Secondary | ICD-10-CM

## 2014-10-18 DIAGNOSIS — E039 Hypothyroidism, unspecified: Secondary | ICD-10-CM

## 2014-10-18 DIAGNOSIS — E119 Type 2 diabetes mellitus without complications: Secondary | ICD-10-CM | POA: Diagnosis not present

## 2014-10-18 DIAGNOSIS — M899 Disorder of bone, unspecified: Secondary | ICD-10-CM

## 2014-10-18 DIAGNOSIS — R600 Localized edema: Secondary | ICD-10-CM

## 2014-10-18 DIAGNOSIS — K5900 Constipation, unspecified: Secondary | ICD-10-CM | POA: Diagnosis not present

## 2014-10-18 DIAGNOSIS — E876 Hypokalemia: Secondary | ICD-10-CM

## 2014-10-18 LAB — POCT UA - MICROALBUMIN: Microalbumin Ur, POC: NEGATIVE mg/L

## 2014-10-18 LAB — POCT GLYCOSYLATED HEMOGLOBIN (HGB A1C)

## 2014-10-18 NOTE — Progress Notes (Signed)
Subjective:    Patient ID: Katie [name], female    DOB: 20-Mar-1932, 81 y.o.   MRN: [id number]  Patient is here for chronic disease follow up. No acute complaint.   Hypertension This is a chronic problem. The current episode started more than 1 year ago. The problem is unchanged. The problem is controlled. Pertinent negatives include no chest pain, headaches, neck pain, palpitations, peripheral edema or shortness of breath. Risk factors for coronary artery disease include sedentary lifestyle, post-menopausal state, dyslipidemia and diabetes mellitus. Past treatments include ACE inhibitors, calcium channel blockers and diuretics. The current treatment provides moderate improvement. There are no compliance problems.  Hypertensive end-organ damage includes a thyroid problem.  Hyperlipidemia This is a chronic problem. The current episode started more than 1 year ago. The problem is uncontrolled. Recent lipid tests were reviewed and are high. Exacerbating diseases include diabetes and hypothyroidism. She has no history of obesity. Pertinent negatives include no chest pain, myalgias or shortness of breath. Current antihyperlipidemic treatment includes statins. The current treatment provides moderate improvement of lipids. Compliance problems include adherence to diet and adherence to exercise.  Risk factors for coronary artery disease include dyslipidemia, family history, hypertension and post-menopausal.  Diabetes She presents for her follow-up diabetic visit. She has type 2 diabetes mellitus. No MedicAlert identification noted. Her disease course has been stable. Pertinent negatives for hypoglycemia include no headaches. Pertinent negatives for diabetes include no chest pain, no visual change and no weakness. Symptoms are stable. Risk factors for coronary artery disease include diabetes mellitus, dyslipidemia, family history, hypertension and post-menopausal. Current diabetic treatment includes diet. She is  compliant with treatment most of the time. When asked about meal planning, she reported none. She has not had a previous visit with a dietitian. She rarely participates in exercise. Her breakfast blood glucose is taken between 8-9 am. Her breakfast blood glucose range is generally 140-180 mg/dl. (Patient does not check daily blood sugars) An ACE inhibitor/angiotensin II receptor blocker is being taken. She does not see a podiatrist.Eye exam is not current.  Thyroid Problem Presents for follow-up (hypothyroidism) visit. Symptoms include constipation. Patient reports no palpitations or visual change. The symptoms have been stable. Her past medical history is significant for diabetes and hyperlipidemia.  GERD Omeprazole keeps symptoms under control Hypokalemia Potassium supplements daily- no c/o lower ext cramping Peripheral edema Lasix keeps under control- since decreased amlodopine to 5 mg daily has gotten much better Chronic back pain Flexeril, lodine and ultram as needed- somedays she does not have to take at all. 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 Constipation  Patient currently taking amitiza but report med is to expensive.     Review of Systems  Constitutional: Negative.   Respiratory: Negative for shortness of breath.   Cardiovascular: Negative for chest pain and palpitations.  Gastrointestinal: Positive for constipation.  Genitourinary: Negative.   Musculoskeletal: Negative for myalgias and neck pain.  Neurological: Negative for weakness and headaches.  Psychiatric/Behavioral: Negative.   All other systems reviewed and are negative.      Objective:   Physical Exam  Constitutional: She is oriented to person, place, and time. She appears well-developed and well-nourished.  HENT:  Nose: Nose normal.  Mouth/Throat: Oropharynx is clear and moist.  Eyes: EOM are normal.  Neck: Trachea normal, normal range of motion and full passive range of motion without pain.  Neck supple. No JVD present. Carotid bruit is not present. No thyromegaly present.  Cardiovascular: Normal rate,  regular rhythm, normal heart sounds and intact distal pulses.  Exam reveals no gallop and no friction rub.   No murmur heard. Pulmonary/Chest: Effort normal and breath sounds normal.  Abdominal: Soft. Bowel sounds are normal. She exhibits no distension and no mass. There is no tenderness.  Musculoskeletal: Normal range of motion. She exhibits edema (bil lower extremities, +1. ).  Lymphadenopathy:    She has no cervical adenopathy.  Neurological: She is alert and oriented to person, place, and time. She has normal reflexes.  Skin: Skin is warm and dry.  Psychiatric: She has a normal mood and affect. Her behavior is normal. Judgment and thought content normal.   BP 140/68 mmHg  Pulse 65  Temp(Src) 97.9 F (36.6 C) (Oral)  Ht _0  (1.702 m)  Wt 185 lb (83.915 kg)  BMI 28.97 kg/m2  Results for orders placed or performed in visit on 10/18/14  POCT glycosylated hemoglobin (Hb A1C)  Result Value Ref Range   Hemoglobin A1C 6.8%   POCT UA - Microalbumin  Result Value Ref Range   Microalbumin Ur, POC neg mg/L        Assessment & Plan:   1. Diabetes mellitus type 2, diet-controlled Carb count  - POCT glycosylated hemoglobin (Hb A1C) - POCT UA - Microalbumin  2. Hyperlipidemia Low fat diet - NMR, lipoprofile  3. Essential hypertension Low salt diet - CMP14+EGFR  4. Gastroesophageal reflux disease, esophagitis presence not specified Avoid eating 2 hours before bed Avoid spicy   5. Constipation, unspecified constipation type Increase fiber in diet Increase fluid intake Metamucil OTC  6. Hypothyroidism, unspecified hypothyroidism type  7. Osteopenia, senile   8. Hypokalemia   9. Peripheral edema Keep foot elevated when sitting     Labs pending Health maintenance reviewed Diet and exercise encouraged Continue all meds Follow up  In 3 months     [place], FNP

## 2014-10-18 NOTE — Patient Instructions (Signed)

## 2014-10-19 LAB — CMP14+EGFR
ALK PHOS: 89 IU/L (ref 39–117)
ALT: 11 IU/L (ref 0–32)
AST: 13 IU/L (ref 0–40)
Albumin/Globulin Ratio: 1.6 (ref 1.1–2.5)
Albumin: 4.4 g/dL (ref 3.5–4.7)
BUN/Creatinine Ratio: 17 (ref 11–26)
BUN: 20 mg/dL (ref 8–27)
Bilirubin Total: 0.6 mg/dL (ref 0.0–1.2)
CALCIUM: 9.3 mg/dL (ref 8.7–10.3)
CHLORIDE: 98 mmol/L (ref 97–108)
CO2: 27 mmol/L (ref 18–29)
Creatinine, Ser: 1.17 mg/dL — ABNORMAL HIGH (ref 0.57–1.00)
GFR calc Af Amer: 50 mL/min/{1.73_m2} — ABNORMAL LOW (ref >59)
GFR calc non Af Amer: 44 mL/min/{1.73_m2} — ABNORMAL LOW (ref >59)
GLUCOSE: 96 mg/dL (ref 65–99)
Globulin, Total: 2.7 g/dL (ref 1.5–4.5)
Potassium: 4.2 mmol/L (ref 3.5–5.2)
Sodium: 138 mmol/L (ref 134–144)
Total Protein: 7.1 g/dL (ref 6.0–8.5)

## 2014-10-19 LAB — NMR, LIPOPROFILE
Cholesterol: 139 mg/dL (ref 100–199)
HDL Cholesterol by NMR: 54 mg/dL (ref >39)
HDL Particle Number: 35.3 umol/L (ref >=30.5)
LDL Particle Number: 848 nmol/L (ref <1000)
LDL SIZE: 20.3 nm (ref >20.5)
LDL-C: 60 mg/dL (ref 0–99)
LP-IR Score: 45 (ref <=45)
SMALL LDL PARTICLE NUMBER: 476 nmol/L (ref <=527)
Triglycerides by NMR: 125 mg/dL (ref 0–149)

## 2014-12-04 ENCOUNTER — Other Ambulatory Visit: Payer: Self-pay | Admitting: Nurse Practitioner

## 2014-12-04 DIAGNOSIS — M549 Dorsalgia, unspecified: Principal | ICD-10-CM

## 2014-12-04 DIAGNOSIS — G8929 Other chronic pain: Secondary | ICD-10-CM

## 2014-12-04 MED ORDER — ETODOLAC 400 MG PO TABS: 400.0000 mg | ORAL_TABLET | Freq: Two times a day (BID) | ORAL | Status: DC

## 2014-12-04 NOTE — Telephone Encounter (Signed)
done

## 2015-01-16 ENCOUNTER — Other Ambulatory Visit: Payer: Self-pay | Admitting: Nurse Practitioner

## 2015-01-16 ENCOUNTER — Other Ambulatory Visit: Payer: Self-pay | Admitting: *Deleted

## 2015-01-16 DIAGNOSIS — E039 Hypothyroidism, unspecified: Secondary | ICD-10-CM

## 2015-01-16 MED ORDER — LEVOTHYROXINE SODIUM 50 MCG PO TABS: 50.0000 ug | ORAL_TABLET | Freq: Every day | ORAL | Status: DC

## 2015-01-16 NOTE — Telephone Encounter (Signed)
Aware, script sent in but keep next appointment and have lab work.

## 2015-02-07 ENCOUNTER — Encounter: Payer: Self-pay | Admitting: Nurse Practitioner

## 2015-02-07 ENCOUNTER — Ambulatory Visit (INDEPENDENT_AMBULATORY_CARE_PROVIDER_SITE_OTHER): Payer: Medicare Other | Admitting: Nurse Practitioner

## 2015-02-07 ENCOUNTER — Encounter (INDEPENDENT_AMBULATORY_CARE_PROVIDER_SITE_OTHER): Payer: Self-pay

## 2015-02-07 ENCOUNTER — Other Ambulatory Visit: Payer: Self-pay | Admitting: *Deleted

## 2015-02-07 VITALS — BP 141/82 | HR 74 | Temp 98.3°F | Ht 67.0 in | Wt 173.2 lb

## 2015-02-07 DIAGNOSIS — E119 Type 2 diabetes mellitus without complications: Secondary | ICD-10-CM | POA: Diagnosis not present

## 2015-02-07 DIAGNOSIS — I10 Essential (primary) hypertension: Secondary | ICD-10-CM

## 2015-02-07 DIAGNOSIS — K5900 Constipation, unspecified: Secondary | ICD-10-CM | POA: Diagnosis not present

## 2015-02-07 DIAGNOSIS — Z23 Encounter for immunization: Secondary | ICD-10-CM | POA: Diagnosis not present

## 2015-02-07 DIAGNOSIS — E876 Hypokalemia: Secondary | ICD-10-CM | POA: Diagnosis not present

## 2015-02-07 DIAGNOSIS — M545 Low back pain, unspecified: Secondary | ICD-10-CM

## 2015-02-07 DIAGNOSIS — E785 Hyperlipidemia, unspecified: Secondary | ICD-10-CM

## 2015-02-07 DIAGNOSIS — Z298 Encounter for other specified prophylactic measures: Secondary | ICD-10-CM

## 2015-02-07 DIAGNOSIS — K219 Gastro-esophageal reflux disease without esophagitis: Secondary | ICD-10-CM | POA: Diagnosis not present

## 2015-02-07 DIAGNOSIS — R609 Edema, unspecified: Secondary | ICD-10-CM

## 2015-02-07 DIAGNOSIS — E039 Hypothyroidism, unspecified: Secondary | ICD-10-CM

## 2015-02-07 LAB — POCT GLYCOSYLATED HEMOGLOBIN (HGB A1C): HEMOGLOBIN A1C: 6.6

## 2015-02-07 MED ORDER — AMLODIPINE BESYLATE 10 MG PO TABS: 10.0000 mg | ORAL_TABLET | Freq: Every day | ORAL | Status: DC

## 2015-02-07 MED ORDER — LOVASTATIN 40 MG PO TABS: 80.0000 mg | ORAL_TABLET | Freq: Every day | ORAL | Status: DC

## 2015-02-07 MED ORDER — LEVOTHYROXINE SODIUM 50 MCG PO TABS: 50.0000 ug | ORAL_TABLET | Freq: Every day | ORAL | Status: DC

## 2015-02-07 MED ORDER — TRAMADOL HCL 50 MG PO TABS: 50.0000 mg | ORAL_TABLET | Freq: Every day | ORAL | Status: DC

## 2015-02-07 MED ORDER — POTASSIUM CHLORIDE CRYS ER 20 MEQ PO TBCR: 20.0000 meq | EXTENDED_RELEASE_TABLET | Freq: Every day | ORAL | Status: DC

## 2015-02-07 MED ORDER — OMEPRAZOLE 40 MG PO CPDR: 40.0000 mg | DELAYED_RELEASE_CAPSULE | Freq: Every day | ORAL | Status: DC

## 2015-02-07 MED ORDER — BENAZEPRIL HCL 40 MG PO TABS: 40.0000 mg | ORAL_TABLET | Freq: Every day | ORAL | Status: DC

## 2015-02-07 MED ORDER — FUROSEMIDE 40 MG PO TABS: 40.0000 mg | ORAL_TABLET | Freq: Every day | ORAL | Status: DC

## 2015-02-07 NOTE — Addendum Note (Signed)
Addended by: [name] on: 02/07/2015 11:42 AM   Modules accepted: Level of Service

## 2015-02-07 NOTE — Patient Instructions (Signed)

## 2015-02-07 NOTE — Progress Notes (Signed)
Subjective:    Patient ID: Katie [name], female    DOB: 20-Mar-1932, 81 y.o.   MRN: [id number]  Patient is here for chronic disease follow up. No acute complaint.   Hypertension This is a chronic problem. The current episode started more than 1 year ago. The problem is unchanged. The problem is controlled. Pertinent negatives include no chest pain, headaches, neck pain, palpitations, peripheral edema or shortness of breath. Risk factors for coronary artery disease include sedentary lifestyle, post-menopausal state, dyslipidemia and diabetes mellitus. Past treatments include ACE inhibitors, calcium channel blockers and diuretics. The current treatment provides moderate improvement. There are no compliance problems.  Hypertensive end-organ damage includes a thyroid problem.  Hyperlipidemia This is a chronic problem. The current episode started more than 1 year ago. The problem is uncontrolled. Recent lipid tests were reviewed and are high. Exacerbating diseases include diabetes and hypothyroidism. She has no history of obesity. Pertinent negatives include no chest pain, myalgias or shortness of breath. Current antihyperlipidemic treatment includes statins. The current treatment provides moderate improvement of lipids. Compliance problems include adherence to diet and adherence to exercise.  Risk factors for coronary artery disease include dyslipidemia, family history, hypertension and post-menopausal.  Diabetes She presents for her follow-up diabetic visit. She has type 2 diabetes mellitus. No MedicAlert identification noted. Her disease course has been stable. Pertinent negatives for hypoglycemia include no headaches. Pertinent negatives for diabetes include no chest pain, no visual change and no weakness. Symptoms are stable. Risk factors for coronary artery disease include diabetes mellitus, dyslipidemia, family history, hypertension and post-menopausal. Current diabetic treatment includes diet. She is  compliant with treatment most of the time. When asked about meal planning, she reported none. She has not had a previous visit with a dietitian. She rarely participates in exercise. Her breakfast blood glucose is taken between 8-9 am. Her breakfast blood glucose range is generally 140-180 mg/dl. (Patient does not check daily blood sugars) An ACE inhibitor/angiotensin II receptor blocker is being taken. She does not see a podiatrist.Eye exam is not current.  Thyroid Problem Presents for follow-up (hypothyroidism) visit. Symptoms include constipation. Patient reports no palpitations or visual change. The symptoms have been stable. Her past medical history is significant for diabetes and hyperlipidemia.  GERD Omeprazole keeps symptoms under control Hypokalemia Potassium supplements daily- no c/o lower ext cramping Peripheral edema Lasix keeps under control- since decreased amlodopine to 5 mg daily has gotten much better Chronic back pain Flexeril, lodine and ultram as needed- somedays she does not have to take at all. 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 Constipation  Patient currently taking amitiza but report med is to expensive.     Review of Systems  Constitutional: Negative.   Respiratory: Negative for shortness of breath.   Cardiovascular: Negative for chest pain and palpitations.  Gastrointestinal: Positive for constipation.  Genitourinary: Negative.   Musculoskeletal: Negative for myalgias and neck pain.  Neurological: Negative for weakness and headaches.  Psychiatric/Behavioral: Negative.   All other systems reviewed and are negative.      Objective:   Physical Exam  Constitutional: She is oriented to person, place, and time. She appears well-developed and well-nourished.  HENT:  Nose: Nose normal.  Mouth/Throat: Oropharynx is clear and moist.  Eyes: EOM are normal.  Neck: Trachea normal, normal range of motion and full passive range of motion without pain.  Neck supple. No JVD present. Carotid bruit is not present. No thyromegaly present.  Cardiovascular: Normal rate,  regular rhythm, normal heart sounds and intact distal pulses.  Exam reveals no gallop and no friction rub.   No murmur heard. Pulmonary/Chest: Effort normal and breath sounds normal.  Abdominal: Soft. Bowel sounds are normal. She exhibits no distension and no mass. There is no tenderness.  Musculoskeletal: Normal range of motion. She exhibits edema (bil lower extremities, +1. ).  Lymphadenopathy:    She has no cervical adenopathy.  Neurological: She is alert and oriented to person, place, and time. She has normal reflexes.  Skin: Skin is warm and dry.  Psychiatric: She has a normal mood and affect. Her behavior is normal. Judgment and thought content normal.   BP 141/82 mmHg  Pulse 74  Temp(Src) 98.3 F (36.8 C) (Oral)  Ht _0  (1.702 m)  Wt 173 lb 3.2 oz (78.563 kg)  BMI 27.12 kg/m2   Results for orders placed or performed in visit on 02/07/15  POCT glycosylated hemoglobin (Hb A1C)  Result Value Ref Range   Hemoglobin A1C 6.6        Assessment & Plan:   1. Essential hypertension Do ot add salt to diet - CMP14+EGFR - benazepril (LOTENSIN) 40 MG tablet; Take 1 tablet (40 mg total) by mouth daily.  Dispense: 90 tablet; Refill: 1 - amLODipine (NORVASC) 10 MG tablet; Take 1 tablet (10 mg total) by mouth daily.  Dispense: 90 tablet; Refill: 1  2. Gastroesophageal reflux disease, esophagitis presence not specified Avoid spicy foods Do not eat 2 hours prior to bedtime omeprazole (PRILOSEC) 40 MG capsule; Take 1 capsule (40 mg total) by mouth daily.  Dispense: 90 capsule; Refill: 1  3. Hypothyroidism, unspecified hypothyroidism type - levothyroxine (SYNTHROID, LEVOTHROID) 50 MCG tablet; Take 1 tablet (50 mcg total) by mouth daily before breakfast.  Dispense: 90 tablet; Refill: 1  4. Diabetes mellitus type 2, diet-controlled Watch carbs in diet - POCT glycosylated  hemoglobin (Hb A1C)  5. Peripheral edema Elevate legs when sitting - furosemide (LASIX) 40 MG tablet; Take 1 tablet (40 mg total) by mouth daily.  Dispense: 90 tablet; Refill: 1  6. Hypokalemia - potassium chloride SA (KLOR-CON M20) 20 MEQ tablet; Take 1 tablet (20 mEq total) by mouth daily.  Dispense: 90 tablet; Refill: 1  7. Hyperlipidemia Low fta diet - Lipid panel - lovastatin (MEVACOR) 40 MG tablet; Take 2 tablets (80 mg total) by mouth at bedtime. Take 2 tablets by mouth at bedtime daily  Dispense: 180 tablet; Refill: 1  8. Need for prophylactic immunotherapy  9. Constipation, unspecified constipation type Increase fiberin diet  10. Midline low back pain without sciatica - traMADol (ULTRAM) 50 MG tablet; Take 1 tablet (50 mg total) by mouth daily.  Dispense: 90 tablet; Refill: 0    Labs pending Health maintenance reviewed Diet and exercise encouraged Continue all meds Follow up  In 3 months   [place], FNP

## 2015-02-08 LAB — CMP14+EGFR
ALT: 11 IU/L (ref 0–32)
AST: 18 IU/L (ref 0–40)
Albumin/Globulin Ratio: 1.7 (ref 1.1–2.5)
Albumin: 4.1 g/dL (ref 3.5–4.7)
Alkaline Phosphatase: 85 IU/L (ref 39–117)
BUN/Creatinine Ratio: 13 (ref 11–26)
BUN: 15 mg/dL (ref 8–27)
Bilirubin Total: 0.6 mg/dL (ref 0.0–1.2)
CALCIUM: 9.8 mg/dL (ref 8.7–10.3)
CO2: 25 mmol/L (ref 18–29)
Chloride: 101 mmol/L (ref 97–108)
Creatinine, Ser: 1.14 mg/dL — ABNORMAL HIGH (ref 0.57–1.00)
GFR calc Af Amer: 52 mL/min/{1.73_m2} — ABNORMAL LOW (ref >59)
GFR calc non Af Amer: 45 mL/min/{1.73_m2} — ABNORMAL LOW (ref >59)
Globulin, Total: 2.4 g/dL (ref 1.5–4.5)
Glucose: 141 mg/dL — ABNORMAL HIGH (ref 65–99)
POTASSIUM: 3.8 mmol/L (ref 3.5–5.2)
Sodium: 142 mmol/L (ref 134–144)
Total Protein: 6.5 g/dL (ref 6.0–8.5)

## 2015-02-08 LAB — LIPID PANEL
CHOL/HDL RATIO: 2.7 ratio (ref 0.0–4.4)
Cholesterol, Total: 141 mg/dL (ref 100–199)
HDL: 53 mg/dL (ref >39)
LDL Calculated: 67 mg/dL (ref 0–99)
Triglycerides: 107 mg/dL (ref 0–149)
VLDL Cholesterol Cal: 21 mg/dL (ref 5–40)

## 2015-02-21 ENCOUNTER — Telehealth: Payer: Self-pay | Admitting: Nurse Practitioner

## 2015-02-21 NOTE — Telephone Encounter (Signed)
Advised patient that steroids do make your BS elevated and she should continue to take them and also check her BS regularly. Advised patient to contact office if over [phone]

## 2015-04-02 ENCOUNTER — Encounter: Payer: Self-pay | Admitting: Nurse Practitioner

## 2015-04-09 ENCOUNTER — Other Ambulatory Visit: Payer: Self-pay | Admitting: Specialist

## 2015-04-09 DIAGNOSIS — M545 Low back pain: Secondary | ICD-10-CM

## 2015-04-13 ENCOUNTER — Ambulatory Visit
Admission: RE | Admit: 2015-04-13 | Discharge: 2015-04-13 | Disposition: A | Discharge status: Home / Self Care | Payer: Medicare Other | Source: Ambulatory Visit | Attending: Specialist | Admitting: Specialist

## 2015-04-13 DIAGNOSIS — M545 Low back pain: Secondary | ICD-10-CM

## 2015-04-16 ENCOUNTER — Other Ambulatory Visit: Payer: Self-pay | Admitting: Nurse Practitioner

## 2015-04-16 DIAGNOSIS — M549 Dorsalgia, unspecified: Principal | ICD-10-CM

## 2015-04-16 DIAGNOSIS — G8929 Other chronic pain: Secondary | ICD-10-CM

## 2015-04-16 MED ORDER — ETODOLAC 400 MG PO TABS: 400.0000 mg | ORAL_TABLET | Freq: Two times a day (BID) | ORAL | Status: DC

## 2015-04-16 NOTE — Telephone Encounter (Signed)
Last office visit was 02/07/15. Kidney functions were elevated but stable at last visit.  Last filled for 90 days in 11/2014.

## 2015-04-30 ENCOUNTER — Telehealth: Payer: Self-pay | Admitting: Nurse Practitioner

## 2015-04-30 DIAGNOSIS — E039 Hypothyroidism, unspecified: Secondary | ICD-10-CM

## 2015-04-30 MED ORDER — LEVOTHYROXINE SODIUM 50 MCG PO TABS: 50.0000 ug | ORAL_TABLET | Freq: Every day | ORAL | Status: DC

## 2015-04-30 NOTE — Telephone Encounter (Signed)
rx sent  But needs to be seen for labs

## 2015-04-30 NOTE — Telephone Encounter (Signed)
No TSH since 02/2014

## 2015-04-30 NOTE — Telephone Encounter (Signed)
Patient has appointment 9/23

## 2015-05-11 ENCOUNTER — Encounter: Payer: Self-pay | Admitting: Nurse Practitioner

## 2015-05-11 ENCOUNTER — Ambulatory Visit (INDEPENDENT_AMBULATORY_CARE_PROVIDER_SITE_OTHER): Payer: Medicare Other | Admitting: Nurse Practitioner

## 2015-05-11 VITALS — BP 149/70 | HR 91 | Temp 98.6°F | Ht 67.0 in | Wt 174.0 lb

## 2015-05-11 DIAGNOSIS — E876 Hypokalemia: Secondary | ICD-10-CM | POA: Diagnosis not present

## 2015-05-11 DIAGNOSIS — Z6826 Body mass index (BMI) 26.0-26.9, adult: Secondary | ICD-10-CM | POA: Insufficient documentation

## 2015-05-11 DIAGNOSIS — Z6827 Body mass index (BMI) 27.0-27.9, adult: Secondary | ICD-10-CM | POA: Diagnosis not present

## 2015-05-11 DIAGNOSIS — E785 Hyperlipidemia, unspecified: Secondary | ICD-10-CM | POA: Diagnosis not present

## 2015-05-11 DIAGNOSIS — I10 Essential (primary) hypertension: Secondary | ICD-10-CM

## 2015-05-11 DIAGNOSIS — K219 Gastro-esophageal reflux disease without esophagitis: Secondary | ICD-10-CM | POA: Diagnosis not present

## 2015-05-11 DIAGNOSIS — M899 Disorder of bone, unspecified: Secondary | ICD-10-CM

## 2015-05-11 DIAGNOSIS — M545 Low back pain, unspecified: Secondary | ICD-10-CM

## 2015-05-11 DIAGNOSIS — M8580 Other specified disorders of bone density and structure, unspecified site: Secondary | ICD-10-CM

## 2015-05-11 DIAGNOSIS — R609 Edema, unspecified: Secondary | ICD-10-CM | POA: Diagnosis not present

## 2015-05-11 DIAGNOSIS — I493 Ventricular premature depolarization: Secondary | ICD-10-CM

## 2015-05-11 DIAGNOSIS — K5900 Constipation, unspecified: Secondary | ICD-10-CM

## 2015-05-11 DIAGNOSIS — E119 Type 2 diabetes mellitus without complications: Secondary | ICD-10-CM

## 2015-05-11 DIAGNOSIS — E039 Hypothyroidism, unspecified: Secondary | ICD-10-CM | POA: Diagnosis not present

## 2015-05-11 LAB — POCT GLYCOSYLATED HEMOGLOBIN (HGB A1C): Hemoglobin A1C: 7.6

## 2015-05-11 MED ORDER — TRAMADOL HCL 50 MG PO TABS: 50.0000 mg | ORAL_TABLET | Freq: Every day | ORAL | Status: DC

## 2015-05-11 MED ORDER — AMLODIPINE BESYLATE 10 MG PO TABS: 10.0000 mg | ORAL_TABLET | Freq: Every day | ORAL | Status: DC

## 2015-05-11 MED ORDER — LOVASTATIN 40 MG PO TABS: 80.0000 mg | ORAL_TABLET | Freq: Every day | ORAL | Status: DC

## 2015-05-11 MED ORDER — LEVOTHYROXINE SODIUM 50 MCG PO TABS: 50.0000 ug | ORAL_TABLET | Freq: Every day | ORAL | Status: DC

## 2015-05-11 MED ORDER — POTASSIUM CHLORIDE CRYS ER 20 MEQ PO TBCR: 20.0000 meq | EXTENDED_RELEASE_TABLET | Freq: Every day | ORAL | Status: DC

## 2015-05-11 MED ORDER — OMEPRAZOLE 40 MG PO CPDR: 40.0000 mg | DELAYED_RELEASE_CAPSULE | Freq: Every day | ORAL | Status: DC

## 2015-05-11 MED ORDER — FUROSEMIDE 40 MG PO TABS: 40.0000 mg | ORAL_TABLET | Freq: Every day | ORAL | Status: DC

## 2015-05-11 MED ORDER — BENAZEPRIL HCL 40 MG PO TABS: 40.0000 mg | ORAL_TABLET | Freq: Every day | ORAL | Status: DC

## 2015-05-11 NOTE — Patient Instructions (Signed)
Bone Health Our bones do many things. They provide structure, protect organs, anchor muscles, and store calcium. Adequate calcium in your diet and weight-bearing physical activity help build strong bones, improve bone amounts, and may reduce the risk of weakening of bones (osteoporosis) later in life. PEAK BONE MASS By age 79, the average woman has acquired most of her skeletal bone mass. A large decline occurs in older adults which increases the risk of osteoporosis. In women this occurs around the time of menopause. It is important for young girls to reach their peak bone mass in order to maintain bone health throughout life. A person with high bone mass as a young adult will be more likely to have a higher bone mass later in life. Not enough calcium consumption and physical activity early on could result in a failure to achieve optimum bone mass in adulthood. OSTEOPOROSIS Osteoporosis is a disease of the bones. It is defined as low bone mass with deterioration of bone structure. Osteoporosis leads to an increase risk of fractures with falls. These fractures commonly happen in the wrist, hip, and spine. While men and women of all ages and background can develop osteoporosis, some of the risk factors for osteoporosis are:  Female.  White.  Postmenopausal.  Older adults.  Small in body size.  Eating a diet low in calcium.  Physically inactive.  Smoking.  Use of some medications.  Family history. CALCIUM Calcium is a mineral needed by the body for healthy bones, teeth, and proper function of the heart, muscles, and nerves. The body cannot produce calcium so it must be absorbed through food. Good sources of calcium include:  Dairy products (low fat or nonfat milk, cheese, and yogurt).  Dark green leafy vegetables (bok choy and broccoli).  Calcium fortified foods (orange juice, cereal, bread, soy beverages, and tofu products).  Nuts (almonds). Recommended amounts of calcium vary  for individuals. RECOMMENDED CALCIUM INTAKES Age and Amount in mg per day  Children 1 to 3 years / 700 mg  Children 4 to 8 years / 1,000 mg  Children 9 to 13 years / 1,300 mg  Teens 14 to 18 years / 1,300 mg  Adults 19 to 50 years / 1,000 mg  Adult women 51 to 70 years / 1,200 mg  Adults 71 years and older / 1,200 mg  Pregnant and breastfeeding teens / 1,300 mg  Pregnant and breastfeeding adults / 1,000 mg Vitamin D also plays an important role in healthy bone development. Vitamin D helps in the absorption of calcium. WEIGHT-BEARING PHYSICAL ACTIVITY Regular physical activity has many positive health benefits. Benefits include strong bones. Weight-bearing physical activity early in life is important in reaching peak bone mass. Weight-bearing physical activities cause muscles and bones to work against gravity. Some examples of weight bearing physical activities include:  Walking, jogging, or running.  Field Hockey.  Jumping rope.  Dancing.  Soccer.  Tennis or Racquetball.  Stair climbing.  Basketball.  Hiking.  Weight lifting.  Aerobic fitness classes. Including weight-bearing physical activity into an exercise plan is a great way to keep bones healthy. Adults: Engage in at least 30 minutes of moderate physical activity on most, preferably all, days of the week. Children: Engage in at least 60 minutes of moderate physical activity on most, preferably all, days of the week. FOR MORE INFORMATION [place] Department of Agriculture, Center for Nutrition Policy and Promotion: www.cnpp.usda.gov National Osteoporosis Foundation: www.nof.org Document Released: 10/25/2003 Document Revised: 11/29/2012 Document Reviewed: 01/24/2009 ExitCare Patient Information   2015 [employer]. This information is not intended to replace advice given to you by your health care provider. Make sure you discuss any questions you have with your health care provider.  

## 2015-05-11 NOTE — Progress Notes (Signed)
Subjective:    Patient ID: Katie [name], female    DOB: Oct 13, 1931, 81 y.o.   MRN: [id number]  Patient is here for chronic disease follow up. No acute complaint.   Hypertension This is a chronic problem. The current episode started more than 1 year ago. The problem is unchanged. The problem is controlled. Pertinent negatives include no chest pain, headaches, neck pain, palpitations, peripheral edema or shortness of breath. Risk factors for coronary artery disease include sedentary lifestyle, post-menopausal state, dyslipidemia and diabetes mellitus. Past treatments include ACE inhibitors, calcium channel blockers and diuretics. The current treatment provides moderate improvement. There are no compliance problems.  Hypertensive end-organ damage includes a thyroid problem.  Hyperlipidemia This is a chronic problem. The current episode started more than 1 year ago. The problem is uncontrolled. Recent lipid tests were reviewed and are high. Exacerbating diseases include diabetes and hypothyroidism. She has no history of obesity. Pertinent negatives include no chest pain, myalgias or shortness of breath. Current antihyperlipidemic treatment includes statins. The current treatment provides moderate improvement of lipids. Compliance problems include adherence to diet and adherence to exercise.  Risk factors for coronary artery disease include dyslipidemia, family history, hypertension and post-menopausal.  Diabetes She presents for her follow-up diabetic visit. She has type 2 diabetes mellitus. No MedicAlert identification noted. Her disease course has been stable. Pertinent negatives for hypoglycemia include no headaches. Pertinent negatives for diabetes include no chest pain, no visual change and no weakness. Symptoms are stable. Risk factors for coronary artery disease include diabetes mellitus, dyslipidemia, family history, hypertension and post-menopausal. Current diabetic treatment includes diet. She is  compliant with treatment most of the time. When asked about meal planning, she reported none. She has not had a previous visit with a dietitian. She rarely participates in exercise. Home blood sugar record trend: patient has been on steroids 2x in the last 3 months and blood sugars were high while taking . Her breakfast blood glucose is taken between 8-9 am. Her breakfast blood glucose range is generally 140-180 mg/dl. (Patient does not check daily blood sugars) An ACE inhibitor/angiotensin II receptor blocker is being taken. She does not see a podiatrist.Eye exam is not current.  Thyroid Problem Presents for follow-up (hypothyroidism) visit. Symptoms include constipation. Patient reports no palpitations or visual change. The symptoms have been stable. Her past medical history is significant for diabetes and hyperlipidemia.  GERD Omeprazole keeps symptoms under control Hypokalemia Potassium supplements daily- no c/o lower ext cramping Peripheral edema Lasix keeps under control- since decreased amlodopine to 5 mg daily has gotten much better Chronic back pain Flexeril, lodine and ultram as needed- somedays she does not have to take at all. 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 Constipation  Patient currently taking amitiza but report med is to expensive.     Review of Systems  Constitutional: Negative.   Respiratory: Negative for shortness of breath.   Cardiovascular: Negative for chest pain and palpitations.  Gastrointestinal: Positive for constipation.  Genitourinary: Negative.   Musculoskeletal: Negative for myalgias and neck pain.  Neurological: Negative for weakness and headaches.  Psychiatric/Behavioral: Negative.   All other systems reviewed and are negative.      Objective:   Physical Exam  Constitutional: She is oriented to person, place, and time. She appears well-developed and well-nourished.  HENT:  Nose: Nose normal.  Mouth/Throat: Oropharynx is clear and  moist.  Eyes: EOM are normal.  Neck: Trachea normal, normal range of motion and  full passive range of motion without pain. Neck supple. No JVD present. Carotid bruit is not present. No thyromegaly present.  Cardiovascular: Normal rate, normal heart sounds and intact distal pulses.  Exam reveals no gallop and no friction rub.   No murmur heard. Irregular rhythym  Pulmonary/Chest: Effort normal and breath sounds normal.  Abdominal: Soft. Bowel sounds are normal. She exhibits no distension and no mass. There is no tenderness.  Musculoskeletal: Normal range of motion. She exhibits edema (bil lower extremities, +1. ).  Lymphadenopathy:    She has no cervical adenopathy.  Neurological: She is alert and oriented to person, place, and time. She has normal reflexes.  Skin: Skin is warm and dry.  Psychiatric: She has a normal mood and affect. Her behavior is normal. Judgment and thought content normal.   BP 149/70 mmHg  Pulse 91  Temp(Src) 98.6 F (37 C) (Oral)  Ht _0  (1.702 m)  Wt 174 lb (78.926 kg)  BMI 27.25 kg/m2   Results for orders placed or performed in visit on 05/11/15  POCT glycosylated hemoglobin (Hb A1C)  Result Value Ref Range   Hemoglobin A1C 7.6    EKG- sinus rhythym with PVC's occasional coupling of PVC-Mary-Margaret Hassell Done, FNP- consulted with Dr. Warrick Parisian      Assessment & Plan:   1. Essential hypertension Do not add saltto diet - CMP14+EGFR - benazepril (LOTENSIN) 40 MG tablet; Take 1 tablet (40 mg total) by mouth daily.  Dispense: 90 tablet; Refill: 1 - amLODipine (NORVASC) 10 MG tablet; Take 1 tablet (10 mg total) by mouth daily.  Dispense: 90 tablet; Refill: 1  2. Diabetes mellitus type 2, diet-controlled Continue to watch carbs in diet - POCT glycosylated hemoglobin (Hb A1C)  3. Hyperlipidemia Low fat diet - Lipid panel - lovastatin (MEVACOR) 40 MG tablet; Take 2 tablets (80 mg total) by mouth at bedtime. Take 2 tablets by mouth at bedtime daily   Dispense: 180 tablet; Refill: 1  4. Gastroesophageal reflux disease, esophagitis presence not specified Avoid spicy foods Do not eat 2 hours prior to bedtime - omeprazole (PRILOSEC) 40 MG capsule; Take 1 capsule (40 mg total) by mouth daily.  Dispense: 90 capsule; Refill: 1  5. Hypothyroidism, unspecified hypothyroidism type - levothyroxine (SYNTHROID, LEVOTHROID) 50 MCG tablet; Take 1 tablet (50 mcg total) by mouth daily before breakfast.  Dispense: 90 tablet; Refill: 0 - Thyroid Panel With TSH  6. Hypokalemia - potassium chloride SA (KLOR-CON M20) 20 MEQ tablet; Take 1 tablet (20 mEq total) by mouth daily.  Dispense: 90 tablet; Refill: 1  7. Peripheral edema Elevate legs when sitting - furosemide (LASIX) 40 MG tablet; Take 1 tablet (40 mg total) by mouth daily.  Dispense: 90 tablet; Refill: 1  8. Osteopenia, senile  9. Constipation, unspecified constipation type 10. Midline low back pain without sciatica - traMADol (ULTRAM) 50 MG tablet; Take 1 tablet (50 mg total) by mouth daily.  Dispense: 90 tablet; Refill: 0  11. BMI 27.0-27.9,adult Discussed diet and exercise for person with BMI >25 Will recheck weight in 3-6 months   12. Frequent PVCs Avoid caffeine - Ambulatory referral to Cardiology    Labs pending Health maintenance reviewed Diet and exercise encouraged Continue all meds Follow up  In 3 month   [place], FNP

## 2015-05-12 LAB — LIPID PANEL
CHOLESTEROL TOTAL: 149 mg/dL (ref 100–199)
Chol/HDL Ratio: 2.4 ratio units (ref 0.0–4.4)
HDL: 62 mg/dL (ref >39)
LDL Calculated: 67 mg/dL (ref 0–99)
Triglycerides: 100 mg/dL (ref 0–149)
VLDL CHOLESTEROL CAL: 20 mg/dL (ref 5–40)

## 2015-05-12 LAB — CMP14+EGFR
A/G RATIO: 1.7 (ref 1.1–2.5)
ALK PHOS: 97 IU/L (ref 39–117)
ALT: 9 IU/L (ref 0–32)
AST: 15 IU/L (ref 0–40)
Albumin: 3.8 g/dL (ref 3.5–4.7)
BILIRUBIN TOTAL: 0.5 mg/dL (ref 0.0–1.2)
BUN/Creatinine Ratio: 19 (ref 11–26)
BUN: 25 mg/dL (ref 8–27)
CHLORIDE: 100 mmol/L (ref 97–108)
CO2: 24 mmol/L (ref 18–29)
Calcium: 9.2 mg/dL (ref 8.7–10.3)
Creatinine, Ser: 1.33 mg/dL — ABNORMAL HIGH (ref 0.57–1.00)
GFR calc Af Amer: 43 mL/min/{1.73_m2} — ABNORMAL LOW (ref >59)
GFR calc non Af Amer: 38 mL/min/{1.73_m2} — ABNORMAL LOW (ref >59)
GLUCOSE: 137 mg/dL — AB (ref 65–99)
Globulin, Total: 2.3 g/dL (ref 1.5–4.5)
POTASSIUM: 4 mmol/L (ref 3.5–5.2)
Sodium: 142 mmol/L (ref 134–144)
Total Protein: 6.1 g/dL (ref 6.0–8.5)

## 2015-05-12 LAB — THYROID PANEL WITH TSH
FREE THYROXINE INDEX: 2.3 (ref 1.2–4.9)
T3 Uptake Ratio: 31 % (ref 24–39)
T4 TOTAL: 7.3 ug/dL (ref 4.5–12.0)
TSH: 3.18 u[IU]/mL (ref 0.450–4.500)

## 2015-05-14 ENCOUNTER — Encounter: Payer: Self-pay | Admitting: Nurse Practitioner

## 2015-05-14 DIAGNOSIS — N183 Chronic kidney disease, stage 3 unspecified: Secondary | ICD-10-CM | POA: Insufficient documentation

## 2015-05-24 ENCOUNTER — Encounter: Payer: Self-pay | Admitting: Nurse Practitioner

## 2015-05-31 ENCOUNTER — Encounter: Payer: Self-pay | Admitting: Family Medicine

## 2015-05-31 ENCOUNTER — Ambulatory Visit (INDEPENDENT_AMBULATORY_CARE_PROVIDER_SITE_OTHER): Payer: Medicare Other | Admitting: Family Medicine

## 2015-05-31 VITALS — BP 163/74 | HR 78 | Temp 97.3°F | Ht 67.0 in | Wt 173.6 lb

## 2015-05-31 DIAGNOSIS — L03116 Cellulitis of left lower limb: Secondary | ICD-10-CM | POA: Diagnosis not present

## 2015-05-31 DIAGNOSIS — L0390 Cellulitis, unspecified: Secondary | ICD-10-CM | POA: Insufficient documentation

## 2015-05-31 MED ORDER — DOXYCYCLINE HYCLATE 100 MG PO TABS: 100.0000 mg | ORAL_TABLET | Freq: Two times a day (BID) | ORAL | Status: DC

## 2015-05-31 NOTE — Progress Notes (Signed)
   HPI  Patient presents today here for concern of skin infection.  Patient explains that she had a red spot show up on her left leg about one week ago. It's gotten gradually worse since that time despite putting Neosporin and keeping it covered. She denies fever, chills, sweats, pain of the area, or history of trauma to the area. She feels well overall.  Very concerned as the area of redness continues to spread.  PMH: Smoking status noted ROS: Per HPI  Objective: BP 163/74 mmHg  Pulse 45  Temp(Src) 97.3 F (36.3 C) (Oral)  Ht 5\' 7"  (1.702 m)  Wt 173 lb 9.6 oz (78.744 kg)  BMI 27.18 kg/m2 Gen: NAD, alert, cooperative with exam HEENT: NCAT CV: No murmur, irregular Resp: CTABL, no wheezes, non-labored Ext: No edema, warm Neuro: Alert and oriented, No gross deficits  Left lower Cervone 2 cm x 2.5 cm circular lesion with a small amount of induration at the distal portion on her left lower extremity on the medial side, no warmth or tenderness to palpation, however she does have an area of erythema that's approximately 5 cm in diameter  Assessment and plan:  # Cellulitis Treatment doxycycline No signs of systemic infection Discussed with her red flags for return and seek emergency medical care.   # Regular heart rate She has an appointment with cardiology in one week. I reviewed her EKG from last time and it looks like she has normal sinus rhythm with very frequent PVCs, no atrial fibrillation She's asymptomatic I reviewed again reasons to seek emergency medical care with her.   Meds ordered this encounter  Medications  . doxycycline (VIBRA-TABS) 100 MG tablet    Sig: Take 1 tablet (100 mg total) by mouth 2 (two) times daily. 1 po bid    Dispense:  20 tablet    Refill:  0    Laroy Apple, MD [place] 05/31/2015, 11:29 AM

## 2015-05-31 NOTE — Patient Instructions (Signed)
Great to see you!  Come back if your leg does not improve within 1 week.   I have sent an antibiotic, doxycycline to your pharmacy.    Cellulitis Cellulitis is an infection of the skin and the tissue under the skin. The infected area is usually red and tender. This happens most often in the arms and lower legs. HOME CARE   Take your antibiotic medicine as told. Finish the medicine even if you start to feel better.  Keep the infected arm or leg raised (elevated).  Put a warm cloth on the area up to 4 times per day.  Only take medicines as told by your doctor.  Keep all doctor visits as told. GET HELP IF:  You see red streaks on the skin coming from the infected area.  Your red area gets bigger or turns a dark color.  Your bone or joint under the infected area is painful after the skin heals.  Your infection comes back in the same area or different area.  You have a puffy (swollen) bump in the infected area.  You have new symptoms.  You have a fever. GET HELP RIGHT AWAY IF:   You feel very sleepy.  You throw up (vomit) or have watery poop (diarrhea).  You feel sick and have muscle aches and pains.   This information is not intended to replace advice given to you by your health care provider. Make sure you discuss any questions you have with your health care provider.   Document Released: 01/21/2008 Document Revised: 04/25/2015 Document Reviewed: 10/20/2011 Elsevier Interactive Patient Education [employer].

## 2015-06-05 NOTE — Progress Notes (Signed)
Chief Complaint  Patient presents with  . New Evaluation    PVC's     History of Present Illness: 81 yo female with history of HTN, HLD, DM, GERD, hiatal hernia and hypothyroidism who is here today as a new patient for evaluation of PVCs. She is followed in [place] by [name], FNP. During a recent visit in their office, she was noted to have frequent PVCs on EKG. She tells me that she feels well. I take care of her husband also. She has had no awareness of palpitations. She has no chest pain, dyspnea, dizziness, LE edema. She has been taking care of her husband at home and has been under much stress.   Primary Care Physician: [name], FNP   Past Medical History  Diagnosis Date  . Hypertension 1985  . Hypercholesterolemia 1998  . GERD (gastroesophageal reflux disease)   . Hiatal hernia   . Obesity   . Hypothyroidism   . NIDDM (non-insulin dependent diabetes mellitus)     diet controlled   . Hyperlipidemia   . Left knee pain   . URI (upper respiratory infection)   . Hypokalemia   . Edema   . Vitamin D deficiency     Past Surgical History  Procedure Laterality Date  . Total abdominal hysterectomy w/ bilateral salpingoophorectomy  1980  . Appendectomy  1980  . Tonsillectomy    . Cholecystectomy  5/00  . Back surgery    . Colonoscopy    . Upper gastrointestinal endoscopy      Current Outpatient Prescriptions  Medication Sig Dispense Refill  . amLODipine (NORVASC) 10 MG tablet Take 1 tablet (10 mg total) by mouth daily. (Patient taking differently: Take 5 mg by mouth daily. ) 90 tablet 1  . aspirin (SB LOW DOSE ASA EC) 81 MG EC tablet Take 81 mg by mouth daily.      . benazepril (LOTENSIN) 40 MG tablet Take 1 tablet (40 mg total) by mouth daily. 90 tablet 1  . calcium carbonate (OS-CAL) 600 MG TABS Take 600 mg by mouth daily.     . Cholecalciferol (VITAMIN D) 2000 UNITS CAPS Take 2,000 Units by mouth daily.     [name]  doxycycline (VIBRA-TABS) 100 MG tablet Take 1 tablet (100 mg total) by mouth 2 (two) times daily. 1 po bid 20 tablet 0  . etodolac (LODINE) 400 MG tablet Take 1 tablet (400 mg total) by mouth 2 (two) times daily. 180 tablet 0  . furosemide (LASIX) 40 MG tablet Take 1 tablet (40 mg total) by mouth daily. 90 tablet 1  . levothyroxine (SYNTHROID, LEVOTHROID) 50 MCG tablet Take 1 tablet (50 mcg total) by mouth daily before breakfast. 90 tablet 0  . lovastatin (MEVACOR) 40 MG tablet Take 2 tablets (80 mg total) by mouth at bedtime. Take 2 tablets by mouth at bedtime daily 180 tablet 1  . omeprazole (PRILOSEC) 40 MG capsule Take 1 capsule (40 mg total) by mouth daily. 90 capsule 1  . potassium chloride SA (KLOR-CON M20) 20 MEQ tablet Take 1 tablet (20 mEq total) by mouth daily. 90 tablet 1  . traMADol (ULTRAM) 50 MG tablet Take 1 tablet (50 mg total) by mouth daily. 90 tablet 0   No current facility-administered medications for this visit.    Allergies  Allergen Reactions  . Macrodantin Nausea And Vomiting  . Metformin And Related Nausea And Vomiting and Other (See Comments)    Bloating  . Penicillins  Rash    Social History   Social History  . Marital Status: Married    Spouse Name: N/A  . Number of Children: 2  . Years of Education: N/A   Occupational History  . Retired    Social History Main Topics  . Smoking status: Never Smoker   . Smokeless tobacco: Never Used  . Alcohol Use: No  . Drug Use: No  . Sexual Activity: Not on file   Other Topics Concern  . Not on file   Social History Narrative    Family History  Problem Relation Age of Onset  . Uterine cancer Sister   . Colon cancer Sister     metastatic ovarian?  . Ovarian cancer Sister   . Diabetes Mother   . Stroke Mother   . Diabetes Brother   . Diabetes Sister     x3  . Heart disease Father     Review of Systems:  As stated in the HPI and otherwise negative.   BP 130/60 mmHg  Pulse 94  Ht 5\' 7"  (1.702 m)   Wt 171 lb 12.8 oz (77.928 kg)  BMI 26.90 kg/m2  SpO2 95%  Physical Examination: General: Well developed, well nourished, NAD HEENT: OP clear, mucus membranes moist SKIN: warm, dry. No rashes. Neuro: No focal deficits Musculoskeletal: Muscle strength 5/5 all ext Psychiatric: Mood and affect normal Neck: No JVD, no carotid bruits, no thyromegaly, no lymphadenopathy. Lungs:Clear bilaterally, no wheezes, rhonci, crackles Cardiovascular: Regular rate and rhythm. No murmurs, gallops or rubs. Abdomen:Soft. Bowel sounds present. Non-tender.  Extremities: No lower extremity edema. Pulses are 2 + in the bilateral DP/PT.  EKG:  EKG is not ordered today. The ekg ordered today demonstrates EKG from 05/24/15 reviewed from outside office. Sinus with PVCs.   Recent Labs: 05/11/2015: ALT 9; BUN 25; Creatinine, Ser 1.33*; Potassium 4.0; Sodium 142; TSH 3.180   Lipid Panel    Component Value Date/Time   CHOL 149 05/11/2015 1131   CHOL 139 10/18/2014 1435   CHOL 158 01/04/2013 0905   TRIG 100 05/11/2015 1131   TRIG 125 10/18/2014 1435   TRIG 184* 01/04/2013 0905   HDL 62 05/11/2015 1131   HDL 54 10/18/2014 1435   HDL 50 01/04/2013 0905   CHOLHDL 2.4 05/11/2015 1131   LDLCALC 67 05/11/2015 1131   LDLCALC 80 03/15/2014 1024   LDLCALC 71 01/04/2013 0905     Wt Readings from Last 3 Encounters:  06/06/15 171 lb 12.8 oz (77.928 kg)  05/31/15 173 lb 9.6 oz (78.744 kg)  05/11/15 174 lb (78.926 kg)     Other studies Reviewed: Additional studies/ records that were reviewed today include: . Review of the above records demonstrates:    Assessment and Plan:   1. Premature Ventricular Contractions: She has no symptoms with her PVCs. Will arrange 48 hour monitor to exclude NSVT, other arrhythmias. Will arrange echo to assess LVEF and exclude structural heart disease.   Current medicines are reviewed at length with the patient today.  The patient does not have concerns regarding medicines.  The  following changes have been made:  no change  Labs/ tests ordered today include:   Orders Placed This Encounter  Procedures  . Holter monitor - 48 hour  . Echocardiogram    Disposition:   FU with me in 4 weeks  Signed, Lauree Chandler, MD 06/06/2015 11:20 AM    [facility] [place], [place], [place]  [address] Phone: [phone]; Fax: [phone]     

## 2015-06-06 ENCOUNTER — Encounter: Payer: Self-pay | Admitting: Cardiovascular Disease

## 2015-06-06 ENCOUNTER — Ambulatory Visit (INDEPENDENT_AMBULATORY_CARE_PROVIDER_SITE_OTHER): Payer: Medicare Other | Admitting: Cardiovascular Disease

## 2015-06-06 VITALS — BP 130/60 | HR 94 | Ht 67.0 in | Wt 171.8 lb

## 2015-06-06 DIAGNOSIS — I493 Ventricular premature depolarization: Secondary | ICD-10-CM | POA: Diagnosis not present

## 2015-06-06 NOTE — Patient Instructions (Signed)
Medication Instructions:  Your physician recommends that you continue on your current medications as directed. Please refer to the Current Medication list given to you today.   Labwork: none  Testing/Procedures: Your physician has requested that you have an echocardiogram. Echocardiography is a painless test that uses sound waves to create images of your heart. It provides your doctor with information about the size and shape of your heart and how well your heart's chambers and valves are working. This procedure takes approximately one hour. There are no restrictions for this procedure.  Your physician has recommended that you wear a holter monitor. Holter monitors are medical devices that record the heart's electrical activity. Doctors most often use these monitors to diagnose arrhythmias. Arrhythmias are problems with the speed or rhythm of the heartbeat. The monitor is a small, portable device. You can wear one while you do your normal daily activities. This is usually used to diagnose what is causing palpitations/syncope (passing out).    Follow-Up: Your physician recommends that you schedule a follow-up appointment in: about 3-4 weeks.  We will call you with appointment time.    Any Other Special Instructions Will Be Listed Below (If Applicable).

## 2015-06-14 ENCOUNTER — Other Ambulatory Visit: Payer: Self-pay

## 2015-06-14 ENCOUNTER — Ambulatory Visit (HOSPITAL_COMMUNITY): Payer: Medicare Other | Attending: Cardiovascular Disease

## 2015-06-14 ENCOUNTER — Ambulatory Visit (INDEPENDENT_AMBULATORY_CARE_PROVIDER_SITE_OTHER): Payer: Medicare Other

## 2015-06-14 DIAGNOSIS — I340 Nonrheumatic mitral (valve) insufficiency: Secondary | ICD-10-CM | POA: Diagnosis not present

## 2015-06-14 DIAGNOSIS — I517 Cardiomegaly: Secondary | ICD-10-CM | POA: Diagnosis not present

## 2015-06-14 DIAGNOSIS — I071 Rheumatic tricuspid insufficiency: Secondary | ICD-10-CM | POA: Diagnosis not present

## 2015-06-14 DIAGNOSIS — I129 Hypertensive chronic kidney disease with stage 1 through stage 4 chronic kidney disease, or unspecified chronic kidney disease: Secondary | ICD-10-CM | POA: Diagnosis not present

## 2015-06-14 DIAGNOSIS — N183 Chronic kidney disease, stage 3 (moderate): Secondary | ICD-10-CM | POA: Insufficient documentation

## 2015-06-14 DIAGNOSIS — I351 Nonrheumatic aortic (valve) insufficiency: Secondary | ICD-10-CM | POA: Insufficient documentation

## 2015-06-14 DIAGNOSIS — I493 Ventricular premature depolarization: Secondary | ICD-10-CM

## 2015-06-14 DIAGNOSIS — I371 Nonrheumatic pulmonary valve insufficiency: Secondary | ICD-10-CM | POA: Diagnosis not present

## 2015-06-14 DIAGNOSIS — E785 Hyperlipidemia, unspecified: Secondary | ICD-10-CM | POA: Diagnosis not present

## 2015-06-14 DIAGNOSIS — E119 Type 2 diabetes mellitus without complications: Secondary | ICD-10-CM | POA: Insufficient documentation

## 2015-06-21 ENCOUNTER — Telehealth: Payer: Self-pay | Admitting: *Deleted

## 2015-06-21 MED ORDER — DILTIAZEM HCL ER COATED BEADS 240 MG PO CP24: 240.0000 mg | ORAL_CAPSULE | Freq: Every day | ORAL | Status: DC

## 2015-06-21 NOTE — Telephone Encounter (Signed)
-----   Message from [name], MD sent at 06/21/2015 10:56 AM EDT ----- Pt has frequent PVCs. She has been asymptomatic. If she is ok with this change, I would like to stop Norvasc and start Cardizem CD 240 mg daily. Thanks, chris

## 2015-06-21 NOTE — Telephone Encounter (Signed)
Patient informed and verbalized understanding of plan. 

## 2015-07-05 ENCOUNTER — Ambulatory Visit: Payer: Medicare Other | Admitting: Cardiology

## 2015-07-09 ENCOUNTER — Ambulatory Visit: Payer: Medicare Other | Admitting: Cardiology

## 2015-07-09 ENCOUNTER — Encounter: Payer: Self-pay | Admitting: Cardiovascular Disease

## 2015-07-09 ENCOUNTER — Ambulatory Visit (INDEPENDENT_AMBULATORY_CARE_PROVIDER_SITE_OTHER): Payer: Medicare Other | Admitting: Cardiovascular Disease

## 2015-07-09 VITALS — BP 130/70 | HR 88 | Ht 67.0 in | Wt 172.0 lb

## 2015-07-09 DIAGNOSIS — I351 Nonrheumatic aortic (valve) insufficiency: Secondary | ICD-10-CM | POA: Diagnosis not present

## 2015-07-09 DIAGNOSIS — I493 Ventricular premature depolarization: Secondary | ICD-10-CM

## 2015-07-09 MED ORDER — METOPROLOL TARTRATE 25 MG PO TABS: 25.0000 mg | ORAL_TABLET | Freq: Two times a day (BID) | ORAL | Status: DC

## 2015-07-09 NOTE — Patient Instructions (Signed)
Medication Instructions: Your physician has recommended you make the following change in your medication:  Stop Cardizem. Start Lopressor 25 mg by mouth twice daily.    Labwork: none  Testing/Procedures: none  Follow-Up: Your physician wants you to follow-up in: 6 months.  You will receive a reminder letter in the mail two months in advance. If you don't receive a letter, please call our office to schedule the follow-up appointment.   Any Other Special Instructions Will Be Listed Below (If Applicable).     If you need a refill on your cardiac medications before your next appointment, please call your pharmacy.

## 2015-07-09 NOTE — Progress Notes (Signed)
Chief Complaint  Patient presents with  . Follow-up    monitor results    History of Present Illness: 81 yo female with history of HTN, HLD, DM, GERD, hiatal hernia and hypothyroidism who is here today for follow up. I saw her as a new patient 06/06/15 for evaluation of PVCs. She is followed in [place] by [name], FNP. During a recent visit in their office, she was noted to have frequent PVCs on EKG. She told me that she feels well. She has had no awareness of palpitations. She has no chest pain, dyspnea, dizziness, LE edema. She has been taking care of her husband at home and has been under much stress. Echo 06/14/15 with normal LV function, moderate AI. 48 hour Holter monitor with frequent PVCs, PACs. Her Norvasc was stopped and Cardizem was started.   She is here today for follow up. She is feeling well overall. She thinks the Cardizem made her swell so she stopped taking it. She has rare palpitations with no near syncope, syncope. No chest pain or SOB.   Primary Care Physician: [name], FNP   Past Medical History  Diagnosis Date  . Hypertension 1985  . Hypercholesterolemia 1998  . GERD (gastroesophageal reflux disease)   . Hiatal hernia   . Obesity   . Hypothyroidism   . NIDDM (non-insulin dependent diabetes mellitus)     diet controlled   . Hyperlipidemia   . Left knee pain   . URI (upper respiratory infection)   . Hypokalemia   . Edema   . Vitamin D deficiency     Past Surgical History  Procedure Laterality Date  . Total abdominal hysterectomy w/ bilateral salpingoophorectomy  1980  . Appendectomy  1980  . Tonsillectomy    . Cholecystectomy  5/00  . Back surgery    . Colonoscopy    . Upper gastrointestinal endoscopy      Current Outpatient Prescriptions  Medication Sig Dispense Refill  . amLODipine (NORVASC) 10 MG tablet Take 5 mg by mouth daily.   0  . aspirin (SB LOW DOSE ASA EC) 81 MG EC tablet Take 81 mg  by mouth daily.      . benazepril (LOTENSIN) 40 MG tablet Take 1 tablet (40 mg total) by mouth daily. 90 tablet 1  . calcium carbonate (OS-CAL) 600 MG TABS Take 600 mg by mouth daily.     . Cholecalciferol (VITAMIN D) 2000 UNITS CAPS Take 2,000 Units by mouth daily.     [name] doxycycline (VIBRA-TABS) 100 MG tablet Take 1 tablet (100 mg total) by mouth 2 (two) times daily. 1 po bid 20 tablet 0  . etodolac (LODINE) 400 MG tablet Take 1 tablet (400 mg total) by mouth 2 (two) times daily. 180 tablet 0  . furosemide (LASIX) 40 MG tablet Take 1 tablet (40 mg total) by mouth daily. 90 tablet 1  . levothyroxine (SYNTHROID, LEVOTHROID) 50 MCG tablet Take 1 tablet (50 mcg total) by mouth daily before breakfast. 90 tablet 0  . lovastatin (MEVACOR) 40 MG tablet Take 2 tablets (80 mg total) by mouth at bedtime. Take 2 tablets by mouth at bedtime daily 180 tablet 1  . omeprazole (PRILOSEC) 40 MG capsule Take 1 capsule (40 mg total) by mouth daily. 90 capsule 1  . potassium chloride SA (KLOR-CON M20) 20 MEQ tablet Take 1 tablet (20 mEq total) by mouth daily. 90 tablet 1  . traMADol (ULTRAM) 50 MG tablet Take 1 tablet (  50 mg total) by mouth daily. 90 tablet 0  . metoprolol tartrate (LOPRESSOR) 25 MG tablet Take 1 tablet (25 mg total) by mouth 2 (two) times daily. 60 tablet 11   No current facility-administered medications for this visit.    Allergies  Allergen Reactions  . Macrodantin Nausea And Vomiting  . Metformin And Related Nausea And Vomiting and Other (See Comments)    Bloating  . Penicillins Rash    Social History   Social History  . Marital Status: Married    Spouse Name: N/A  . Number of Children: 2  . Years of Education: N/A   Occupational History  . Retired    Social History Main Topics  . Smoking status: Never Smoker   . Smokeless tobacco: Never Used  . Alcohol Use: No  . Drug Use: No  . Sexual Activity: Not on file   Other Topics Concern  . Not on file   Social History  Narrative    Family History  Problem Relation Age of Onset  . Uterine cancer Sister   . Colon cancer Sister     metastatic ovarian?  . Ovarian cancer Sister   . Diabetes Mother   . Stroke Mother   . Diabetes Brother   . Diabetes Sister     x3  . Heart disease Father     Review of Systems:  As stated in the HPI and otherwise negative.   BP 130/70 mmHg  Pulse 88  Ht 5\' 7"  (1.702 m)  Wt 172 lb (78.019 kg)  BMI 26.93 kg/m2  SpO2 95%  Physical Examination: General: Well developed, well nourished, NAD HEENT: OP clear, mucus membranes moist SKIN: warm, dry. No rashes. Neuro: No focal deficits Musculoskeletal: Muscle strength 5/5 all ext Psychiatric: Mood and affect normal Neck: No JVD, no carotid bruits, no thyromegaly, no lymphadenopathy. Lungs:Clear bilaterally, no wheezes, rhonci, crackles Cardiovascular: Regular rate and rhythm. No murmurs, gallops or rubs. Abdomen:Soft. Bowel sounds present. Non-tender.  Extremities: No lower extremity edema. Pulses are 2 + in the bilateral DP/PT.  Echo 06/14/15: Left ventricle: The cavity size was normal. There was mild concentric hypertrophy. Systolic function was normal. The estimated ejection fraction was in the range of 60% to 65%. Wall motion was normal; there were no regional wall motion abnormalities. Doppler parameters are consistent with abnormal left ventricular relaxation (grade 1 diastolic dysfunction). - Aortic valve: There was moderate regurgitation. - Mitral valve: Calcified annulus. There was trivial regurgitation. - Left atrium: The atrium was mildly dilated. - Right ventricle: The cavity size was normal. Wall thickness was normal. Systolic function was normal. - Tricuspid valve: There was trivial regurgitation. - Pulmonic valve: There was mild regurgitation. - Pulmonary arteries: PA peak pressure: 34 mm Hg (S). - Inferior vena cava: The vessel was normal in size. The respirophasic diameter changes  were in the normal range (>= 50%), consistent with normal central venous pressure. - Severity of aortic reguritation is difficult to assess. Pressure half time is 370 cm/s (moderate). However, not much AR is noted in the LVOT or short axis of the aortic valve on Doppler imaging (mild). However, diastolic flow reversal is noted in the descending aorta (severe). Overall severity is likely moderate.  EKG:  EKG is not ordered today.  Recent Labs: 05/11/2015: ALT 9; BUN 25; Creatinine, Ser 1.33*; Potassium 4.0; Sodium 142; TSH 3.180   Lipid Panel    Component Value Date/Time   CHOL 149 05/11/2015 1131   CHOL 139 10/18/2014 1435  CHOL 158 01/04/2013 0905   TRIG 100 05/11/2015 1131   TRIG 125 10/18/2014 1435   TRIG 184* 01/04/2013 0905   HDL 62 05/11/2015 1131   HDL 54 10/18/2014 1435   HDL 50 01/04/2013 0905   CHOLHDL 2.4 05/11/2015 1131   LDLCALC 67 05/11/2015 1131   LDLCALC 80 03/15/2014 1024   LDLCALC 71 01/04/2013 0905     Wt Readings from Last 3 Encounters:  07/09/15 172 lb (78.019 kg)  06/06/15 171 lb 12.8 oz (77.928 kg)  05/31/15 173 lb 9.6 oz (78.744 kg)     Other studies Reviewed: Additional studies/ records that were reviewed today include: . Review of the above records demonstrates:    Assessment and Plan:   1. Premature Ventricular Contractions: She is asymptomatic. She did not tolerate the Cardizem. Echo with normal LV systolic function. Will try Lopressor 25 mg po BID.   2. Aortic valve insufficiency: Moderate by echo. Will repeat echo in one year.   Current medicines are reviewed at length with the patient today.  The patient does not have concerns regarding medicines.  The following changes have been made:  no change  Labs/ tests ordered today include:   No orders of the defined types were placed in this encounter.    Disposition:   FU with me in 6 months.   Signed, Lauree Chandler, MD 07/09/2015 10:24 AM    [facility] [address] [place], [place], [place]  [address] Phone: [phone]; Fax: [phone]

## 2015-07-24 ENCOUNTER — Other Ambulatory Visit: Payer: Self-pay

## 2015-07-24 DIAGNOSIS — M549 Dorsalgia, unspecified: Principal | ICD-10-CM

## 2015-07-24 DIAGNOSIS — G8929 Other chronic pain: Secondary | ICD-10-CM

## 2015-07-24 DIAGNOSIS — E039 Hypothyroidism, unspecified: Secondary | ICD-10-CM

## 2015-07-24 MED ORDER — LEVOTHYROXINE SODIUM 50 MCG PO TABS: 50.0000 ug | ORAL_TABLET | Freq: Every day | ORAL | Status: DC

## 2015-07-24 MED ORDER — ETODOLAC 400 MG PO TABS: 400.0000 mg | ORAL_TABLET | Freq: Two times a day (BID) | ORAL | Status: DC

## 2015-08-14 ENCOUNTER — Ambulatory Visit (INDEPENDENT_AMBULATORY_CARE_PROVIDER_SITE_OTHER): Payer: Medicare Other

## 2015-08-14 ENCOUNTER — Encounter: Payer: Self-pay | Admitting: Nurse Practitioner

## 2015-08-14 ENCOUNTER — Ambulatory Visit (INDEPENDENT_AMBULATORY_CARE_PROVIDER_SITE_OTHER): Payer: Medicare Other | Admitting: Nurse Practitioner

## 2015-08-14 VITALS — BP 145/85 | HR 57 | Temp 97.8°F | Ht 67.0 in | Wt 170.0 lb

## 2015-08-14 DIAGNOSIS — E876 Hypokalemia: Secondary | ICD-10-CM | POA: Diagnosis not present

## 2015-08-14 DIAGNOSIS — E785 Hyperlipidemia, unspecified: Secondary | ICD-10-CM

## 2015-08-14 DIAGNOSIS — I10 Essential (primary) hypertension: Secondary | ICD-10-CM | POA: Diagnosis not present

## 2015-08-14 DIAGNOSIS — Z13820 Encounter for screening for osteoporosis: Secondary | ICD-10-CM

## 2015-08-14 DIAGNOSIS — R609 Edema, unspecified: Secondary | ICD-10-CM

## 2015-08-14 DIAGNOSIS — Z6827 Body mass index (BMI) 27.0-27.9, adult: Secondary | ICD-10-CM

## 2015-08-14 DIAGNOSIS — K219 Gastro-esophageal reflux disease without esophagitis: Secondary | ICD-10-CM | POA: Diagnosis not present

## 2015-08-14 DIAGNOSIS — N183 Chronic kidney disease, stage 3 unspecified: Secondary | ICD-10-CM

## 2015-08-14 DIAGNOSIS — Z780 Asymptomatic menopausal state: Secondary | ICD-10-CM | POA: Diagnosis not present

## 2015-08-14 DIAGNOSIS — E119 Type 2 diabetes mellitus without complications: Secondary | ICD-10-CM

## 2015-08-14 DIAGNOSIS — E039 Hypothyroidism, unspecified: Secondary | ICD-10-CM

## 2015-08-14 DIAGNOSIS — K5900 Constipation, unspecified: Secondary | ICD-10-CM

## 2015-08-14 LAB — POCT GLYCOSYLATED HEMOGLOBIN (HGB A1C): Hemoglobin A1C: 6.5

## 2015-08-14 MED ORDER — LEVOTHYROXINE SODIUM 50 MCG PO TABS: 50.0000 ug | ORAL_TABLET | Freq: Every day | ORAL | Status: DC

## 2015-08-14 MED ORDER — OMEPRAZOLE 40 MG PO CPDR: 40.0000 mg | DELAYED_RELEASE_CAPSULE | Freq: Every day | ORAL | Status: DC

## 2015-08-14 MED ORDER — BENAZEPRIL HCL 40 MG PO TABS: 40.0000 mg | ORAL_TABLET | Freq: Every day | ORAL | Status: DC

## 2015-08-14 MED ORDER — METOPROLOL TARTRATE 25 MG PO TABS: 25.0000 mg | ORAL_TABLET | Freq: Two times a day (BID) | ORAL | Status: DC

## 2015-08-14 MED ORDER — LOVASTATIN 40 MG PO TABS: 80.0000 mg | ORAL_TABLET | Freq: Every day | ORAL | Status: DC

## 2015-08-14 MED ORDER — FUROSEMIDE 40 MG PO TABS: 40.0000 mg | ORAL_TABLET | Freq: Every day | ORAL | Status: DC

## 2015-08-14 MED ORDER — AMLODIPINE BESYLATE 10 MG PO TABS: 5.0000 mg | ORAL_TABLET | Freq: Every day | ORAL | Status: DC

## 2015-08-14 MED ORDER — POTASSIUM CHLORIDE CRYS ER 20 MEQ PO TBCR: 20.0000 meq | EXTENDED_RELEASE_TABLET | Freq: Every day | ORAL | Status: DC

## 2015-08-14 NOTE — Progress Notes (Addendum)
Subjective:    Patient ID: Katie [name], female    DOB: 1932-05-19, 81 y.o.   MRN: [id number]  Patient is here for chronic disease follow up. No acute complaint. 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  Hypertension This is a chronic problem. The current episode started more than 1 year ago. The problem is unchanged. The problem is controlled. Pertinent negatives include no chest pain, headaches, neck pain, palpitations, peripheral edema or shortness of breath. Risk factors for coronary artery disease include sedentary lifestyle, post-menopausal state, dyslipidemia and diabetes mellitus. Past treatments include ACE inhibitors, calcium channel blockers and diuretics. The current treatment provides moderate improvement. There are no compliance problems.  Hypertensive end-organ damage includes a thyroid problem.  Hyperlipidemia This is a chronic problem. The current episode started more than 1 year ago. The problem is uncontrolled. Recent lipid tests were reviewed and are high. Exacerbating diseases include diabetes and hypothyroidism. She has no history of obesity. Pertinent negatives include no chest pain, myalgias or shortness of breath. Current antihyperlipidemic treatment includes statins. The current treatment provides moderate improvement of lipids. Compliance problems include adherence to diet and adherence to exercise.  Risk factors for coronary artery disease include dyslipidemia, family history, hypertension and post-menopausal.  Diabetes She presents for her follow-up diabetic visit. She has type 2 diabetes mellitus. No MedicAlert identification noted. Her disease course has been stable. Pertinent negatives for hypoglycemia include no headaches. Pertinent negatives for diabetes include no chest pain, no visual change and no weakness. Symptoms are stable. Risk factors for coronary artery disease include diabetes mellitus, dyslipidemia, family history,  hypertension and post-menopausal. Current diabetic treatment includes diet. She is compliant with treatment most of the time. When asked about meal planning, she reported none. She has not had a previous visit with a dietitian. She rarely participates in exercise. Home blood sugar record trend: patient has been on steroids 2x in the last 3 months and blood sugars were high while taking . Her breakfast blood glucose is taken between 8-9 am. Her breakfast blood glucose range is generally 140-180 mg/dl. (Patient does not check daily blood sugars) An ACE inhibitor/angiotensin II receptor blocker is being taken. She does not see a podiatrist.Eye exam is not current.  Thyroid Problem Presents for follow-up (hypothyroidism) visit. Symptoms include constipation. Patient reports no palpitations or visual change. The symptoms have been stable. Her past medical history is significant for diabetes and hyperlipidemia.  GERD Omeprazole keeps symptoms under control Hypokalemia Potassium supplements daily- no c/o lower ext cramping Peripheral edema Lasix keeps under control- since decreased amlodopine to 5 mg daily has gotten much better Chronic back pain Flexeril, lodine and ultram as needed- somedays she does not have to take at all. 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 She is seeing ortho and they have recently given her a shot in her back. Constipation  Patient currently taking amitiza but report med is to expensive.     Review of Systems  Constitutional: Negative.   Respiratory: Negative for shortness of breath.   Cardiovascular: Negative for chest pain and palpitations.  Gastrointestinal: Positive for constipation.  Genitourinary: Negative.   Musculoskeletal: Negative for myalgias and neck pain.  Neurological: Negative for weakness and headaches.  Psychiatric/Behavioral: Negative.   All other systems reviewed and are negative.      Objective:   Physical Exam  Constitutional: She  is oriented to person, place, and time. She  appears well-developed and well-nourished.  HENT:  Nose: Nose normal.  Mouth/Throat: Oropharynx is clear and moist.  Eyes: EOM are normal.  Neck: Trachea normal, normal range of motion and full passive range of motion without pain. Neck supple. No JVD present. Carotid bruit is not present. No thyromegaly present.  Cardiovascular: Normal rate, normal heart sounds and intact distal pulses.  Exam reveals no gallop and no friction rub.   No murmur heard. Irregular rhythym  Pulmonary/Chest: Effort normal and breath sounds normal.  Abdominal: Soft. Bowel sounds are normal. She exhibits no distension and no mass. There is no tenderness.  Musculoskeletal: Normal range of motion. She exhibits edema (bil lower extremities, +1. ).  Lymphadenopathy:    She has no cervical adenopathy.  Neurological: She is alert and oriented to person, place, and time. She has normal reflexes.  Skin: Skin is warm and dry.  Psychiatric: She has a normal mood and affect. Her behavior is normal. Judgment and thought content normal.     BP 145/85 mmHg  Pulse 57  Temp(Src) 97.8 F (36.6 C) (Oral)  Ht '5\' 7"'  (1.702 m)  Wt 170 lb (77.111 kg)  BMI 26.62 kg/m2  Results for orders placed or performed in visit on 08/14/15  POCT glycosylated hemoglobin (Hb A1C)  Result Value Ref Range   Hemoglobin A1C 6.5          Assessment & Plan:   1. Essential hypertension Do not add salt to diet - CMP14+EGFR - benazepril (LOTENSIN) 40 MG tablet; Take 1 tablet (40 mg total) by mouth daily.  Dispense: 90 tablet; Refill: 1 - metoprolol tartrate (LOPRESSOR) 25 MG tablet; Take 1 tablet (25 mg total) by mouth 2 (two) times daily.  Dispense: 60 tablet; Refill: 11 - amLODipine (NORVASC) 10 MG tablet; Take 0.5 tablets (5 mg total) by mouth daily.  Dispense: 30 tablet; Refill: 5  2. Diabetes mellitus type 2, diet-controlled (HCC) Continue carb counting - POCT glycosylated hemoglobin (Hb  A1C) - Microalbumin / creatinine urine ratio  3. Hyperlipidemia Low fat diet - Lipid panel - lovastatin (MEVACOR) 40 MG tablet; Take 2 tablets (80 mg total) by mouth at bedtime. Take 2 tablets by mouth at bedtime daily  Dispense: 180 tablet; Refill: 1  4. Gastroesophageal reflux disease, esophagitis presence not specified Avoid spicy foods Do not eat 2 hours prior to bedtime - omeprazole (PRILOSEC) 40 MG capsule; Take 1 capsule (40 mg total) by mouth daily.  Dispense: 90 capsule; Refill: 1  5. Constipation, unspecified constipation type Increase fiber in diet  6. Hypothyroidism, unspecified hypothyroidism type - levothyroxine (SYNTHROID, LEVOTHROID) 50 MCG tablet; Take 1 tablet (50 mcg total) by mouth daily before breakfast.  Dispense: 90 tablet; Refill: 0  7. CKD (chronic kidney disease) stage 3, GFR 30-59 ml/min currently just watching labs  8. Hypokalemia - potassium chloride SA (KLOR-CON M20) 20 MEQ tablet; Take 1 tablet (20 mEq total) by mouth daily.  Dispense: 90 tablet; Refill: 1  9. Peripheral edema Elevate legs when sitting - furosemide (LASIX) 40 MG tablet; Take 1 tablet (40 mg total) by mouth daily.  Dispense: 90 tablet; Refill: 1  10. BMI 27.0-27.9,adult Discussed diet and exercise for person with BMI >25 Will recheck weight in 3-6 months     Labs pending Health maintenance reviewed Diet and exercise encouraged Continue all meds Follow up  In 3 month    [place], FNP

## 2015-08-14 NOTE — Patient Instructions (Signed)
Health Maintenance, Female Adopting a healthy lifestyle and getting preventive care can go a long way to promote health and wellness. Talk with your health care provider about what schedule of regular examinations is right for you. This is a good chance for you to check in with your provider about disease prevention and staying healthy. In between checkups, there are plenty of things you can do on your own. Experts have done a lot of research about which lifestyle changes and preventive measures are most likely to keep you healthy. Ask your health care provider for more information. WEIGHT AND DIET  Eat a healthy diet  Be sure to include plenty of vegetables, fruits, low-fat dairy products, and lean protein.  Do not eat a lot of foods high in solid fats, added sugars, or salt.  Get regular exercise. This is one of the most important things you can do for your health.  Most adults should exercise for at least 150 minutes each week. The exercise should increase your heart rate and make you sweat (moderate-intensity exercise).  Most adults should also do strengthening exercises at least twice a week. This is in addition to the moderate-intensity exercise.  Maintain a healthy weight  Body mass index (BMI) is a measurement that can be used to identify possible weight problems. It estimates body fat based on height and weight. Your health care provider can help determine your BMI and help you achieve or maintain a healthy weight.  For females 20 years of age and older:   A BMI below 18.5 is considered underweight.  A BMI of 18.5 to 24.9 is normal.  A BMI of 25 to 29.9 is considered overweight.  A BMI of 30 and above is considered obese.  Watch levels of cholesterol and blood lipids  You should start having your blood tested for lipids and cholesterol at 79 years of age, then have this test every 5 years.  You may need to have your cholesterol levels checked more often if:  Your lipid  or cholesterol levels are high.  You are older than 79 years of age.  You are at high risk for heart disease.  CANCER SCREENING   Lung Cancer  Lung cancer screening is recommended for adults 55-80 years old who are at high risk for lung cancer because of a history of smoking.  A yearly low-dose CT scan of the lungs is recommended for people who:  Currently smoke.  Have quit within the past 15 years.  Have at least a 30-pack-year history of smoking. A pack year is smoking an average of one pack of cigarettes a day for 1 year.  Yearly screening should continue until it has been 15 years since you quit.  Yearly screening should stop if you develop a health problem that would prevent you from having lung cancer treatment.  Breast Cancer  Practice breast self-awareness. This means understanding how your breasts normally appear and feel.  It also means doing regular breast self-exams. Let your health care provider know about any changes, no matter how small.  If you are in your 20s or 30s, you should have a clinical breast exam (CBE) by a health care provider every 1-3 years as part of a regular health exam.  If you are 40 or older, have a CBE every year. Also consider having a breast X-ray (mammogram) every year.  If you have a family history of breast cancer, talk to your health care provider about genetic screening.  If you   are at high risk for breast cancer, talk to your health care provider about having an MRI and a mammogram every year.  Breast cancer gene (BRCA) assessment is recommended for women who have family members with BRCA-related cancers. BRCA-related cancers include:  Breast.  Ovarian.  Tubal.  Peritoneal cancers.  Results of the assessment will determine the need for genetic counseling and BRCA1 and BRCA2 testing. Cervical Cancer Your health care provider may recommend that you be screened regularly for cancer of the pelvic organs (ovaries, uterus, and  vagina). This screening involves a pelvic examination, including checking for microscopic changes to the surface of your cervix (Pap test). You may be encouraged to have this screening done every 3 years, beginning at age 21.  For women ages 30-65, health care providers may recommend pelvic exams and Pap testing every 3 years, or they may recommend the Pap and pelvic exam, combined with testing for human papilloma virus (HPV), every 5 years. Some types of HPV increase your risk of cervical cancer. Testing for HPV may also be done on women of any age with unclear Pap test results.  Other health care providers may not recommend any screening for nonpregnant women who are considered low risk for pelvic cancer and who do not have symptoms. Ask your health care provider if a screening pelvic exam is right for you.  If you have had past treatment for cervical cancer or a condition that could lead to cancer, you need Pap tests and screening for cancer for at least 20 years after your treatment. If Pap tests have been discontinued, your risk factors (such as having a new sexual partner) need to be reassessed to determine if screening should resume. Some women have medical problems that increase the chance of getting cervical cancer. In these cases, your health care provider may recommend more frequent screening and Pap tests. Colorectal Cancer  This type of cancer can be detected and often prevented.  Routine colorectal cancer screening usually begins at 79 years of age and continues through 79 years of age.  Your health care provider may recommend screening at an earlier age if you have risk factors for colon cancer.  Your health care provider may also recommend using home test kits to check for hidden blood in the stool.  A small camera at the end of a tube can be used to examine your colon directly (sigmoidoscopy or colonoscopy). This is done to check for the earliest forms of colorectal  cancer.  Routine screening usually begins at age 50.  Direct examination of the colon should be repeated every 5-10 years through 79 years of age. However, you may need to be screened more often if early forms of precancerous polyps or small growths are found. Skin Cancer  Check your skin from head to toe regularly.  Tell your health care provider about any new moles or changes in moles, especially if there is a change in a mole's shape or color.  Also tell your health care provider if you have a mole that is larger than the size of a pencil eraser.  Always use sunscreen. Apply sunscreen liberally and repeatedly throughout the day.  Protect yourself by wearing long sleeves, pants, a wide-brimmed hat, and sunglasses whenever you are outside. HEART DISEASE, DIABETES, AND HIGH BLOOD PRESSURE   High blood pressure causes heart disease and increases the risk of stroke. High blood pressure is more likely to develop in:  People who have blood pressure in the high end   of the normal range (130-139/85-89 mm Hg).  People who are overweight or obese.  People who are African American.  If you are 38-23 years of age, have your blood pressure checked every 3-5 years. If you are 61 years of age or older, have your blood pressure checked every year. You should have your blood pressure measured twice--once when you are at a hospital or clinic, and once when you are not at a hospital or clinic. Record the average of the two measurements. To check your blood pressure when you are not at a hospital or clinic, you can use:  An automated blood pressure machine at a pharmacy.  A home blood pressure monitor.  If you are between 45 years and 39 years old, ask your health care provider if you should take aspirin to prevent strokes.  Have regular diabetes screenings. This involves taking a blood sample to check your fasting blood sugar level.  If you are at a normal weight and have a low risk for diabetes,  have this test once every three years after 79 years of age.  If you are overweight and have a high risk for diabetes, consider being tested at a younger age or more often. PREVENTING INFECTION  Hepatitis B  If you have a higher risk for hepatitis B, you should be screened for this virus. You are considered at high risk for hepatitis B if:  You were born in a country where hepatitis B is common. Ask your health care provider which countries are considered high risk.  Your parents were born in a high-risk country, and you have not been immunized against hepatitis B (hepatitis B vaccine).  You have HIV or AIDS.  You use needles to inject street drugs.  You live with someone who has hepatitis B.  You have had sex with someone who has hepatitis B.  You get hemodialysis treatment.  You take certain medicines for conditions, including cancer, organ transplantation, and autoimmune conditions. Hepatitis C  Blood testing is recommended for:  Everyone born from [birth year] through 1963.  Anyone with known risk factors for hepatitis C. Sexually transmitted infections (STIs)  You should be screened for sexually transmitted infections (STIs) including gonorrhea and chlamydia if:  You are sexually active and are younger than 79 years of age.  You are older than 79 years of age and your health care provider tells you that you are at risk for this type of infection.  Your sexual activity has changed since you were last screened and you are at an increased risk for chlamydia or gonorrhea. Ask your health care provider if you are at risk.  If you do not have HIV, but are at risk, it may be recommended that you take a prescription medicine daily to prevent HIV infection. This is called pre-exposure prophylaxis (PrEP). You are considered at risk if:  You are sexually active and do not regularly use condoms or know the HIV status of your partner(s).  You take drugs by injection.  You are sexually  active with a partner who has HIV. Talk with your health care provider about whether you are at high risk of being infected with HIV. If you choose to begin PrEP, you should first be tested for HIV. You should then be tested every 3 months for as long as you are taking PrEP.  PREGNANCY   If you are premenopausal and you may become pregnant, ask your health care provider about preconception counseling.  If you may  become pregnant, take 400 to 800 micrograms (mcg) of folic acid every day.  If you want to prevent pregnancy, talk to your health care provider about birth control (contraception). OSTEOPOROSIS AND MENOPAUSE   Osteoporosis is a disease in which the bones lose minerals and strength with aging. This can result in serious bone fractures. Your risk for osteoporosis can be identified using a bone density scan.  If you are 61 years of age or older, or if you are at risk for osteoporosis and fractures, ask your health care provider if you should be screened.  Ask your health care provider whether you should take a calcium or vitamin D supplement to lower your risk for osteoporosis.  Menopause may have certain physical symptoms and risks.  Hormone replacement therapy may reduce some of these symptoms and risks. Talk to your health care provider about whether hormone replacement therapy is right for you.  HOME CARE INSTRUCTIONS   Schedule regular health, dental, and eye exams.  Stay current with your immunizations.   Do not use any tobacco products including cigarettes, chewing tobacco, or electronic cigarettes.  If you are pregnant, do not drink alcohol.  If you are breastfeeding, limit how much and how often you drink alcohol.  Limit alcohol intake to no more than 1 drink per day for nonpregnant women. One drink equals 12 ounces of beer, 5 ounces of wine, or 1 ounces of hard liquor.  Do not use street drugs.  Do not share needles.  Ask your health care provider for help if  you need support or information about quitting drugs.  Tell your health care provider if you often feel depressed.  Tell your health care provider if you have ever been abused or do not feel safe at home.   This information is not intended to replace advice given to you by your health care provider. Make sure you discuss any questions you have with your health care provider.   Document Released: 02/17/2011 Document Revised: 08/25/2014 Document Reviewed: 07/06/2013 Elsevier Interactive Patient Education [employer].

## 2015-08-14 NOTE — Addendum Note (Signed)
Addended by: [name] on: 08/14/2015 11:57 AM   Modules accepted: Orders

## 2015-08-15 LAB — MICROALBUMIN / CREATININE URINE RATIO
CREATININE, UR: 43.4 mg/dL
MICROALB/CREAT RATIO: 59.2 mg/g{creat} — AB (ref 0.0–30.0)
Microalbumin, Urine: 25.7 ug/mL

## 2015-08-15 LAB — CMP14+EGFR
A/G RATIO: 1.7 (ref 1.1–2.5)
ALT: 12 IU/L (ref 0–32)
AST: 12 IU/L (ref 0–40)
Albumin: 4.1 g/dL (ref 3.5–4.7)
Alkaline Phosphatase: 82 IU/L (ref 39–117)
BILIRUBIN TOTAL: 0.5 mg/dL (ref 0.0–1.2)
BUN/Creatinine Ratio: 24 (ref 11–26)
BUN: 26 mg/dL (ref 8–27)
CALCIUM: 9.2 mg/dL (ref 8.7–10.3)
CHLORIDE: 100 mmol/L (ref 96–106)
CO2: 22 mmol/L (ref 18–29)
Creatinine, Ser: 1.1 mg/dL — ABNORMAL HIGH (ref 0.57–1.00)
GFR, EST AFRICAN AMERICAN: 54 mL/min/{1.73_m2} — AB (ref >59)
GFR, EST NON AFRICAN AMERICAN: 47 mL/min/{1.73_m2} — AB (ref >59)
GLOBULIN, TOTAL: 2.4 g/dL (ref 1.5–4.5)
Glucose: 121 mg/dL — ABNORMAL HIGH (ref 65–99)
POTASSIUM: 4.1 mmol/L (ref 3.5–5.2)
SODIUM: 139 mmol/L (ref 134–144)
TOTAL PROTEIN: 6.5 g/dL (ref 6.0–8.5)

## 2015-08-15 LAB — LIPID PANEL
CHOL/HDL RATIO: 2.4 ratio (ref 0.0–4.4)
Cholesterol, Total: 139 mg/dL (ref 100–199)
HDL: 58 mg/dL (ref >39)
LDL Calculated: 61 mg/dL (ref 0–99)
Triglycerides: 100 mg/dL (ref 0–149)
VLDL Cholesterol Cal: 20 mg/dL (ref 5–40)

## 2015-08-24 ENCOUNTER — Encounter: Payer: Self-pay | Admitting: Pharmacist

## 2015-08-24 ENCOUNTER — Ambulatory Visit (INDEPENDENT_AMBULATORY_CARE_PROVIDER_SITE_OTHER): Payer: Medicare Other | Admitting: Pharmacist

## 2015-08-24 VITALS — Ht 67.0 in | Wt 171.0 lb

## 2015-08-24 DIAGNOSIS — M8580 Other specified disorders of bone density and structure, unspecified site: Secondary | ICD-10-CM

## 2015-08-24 DIAGNOSIS — M899 Disorder of bone, unspecified: Secondary | ICD-10-CM | POA: Diagnosis not present

## 2015-08-24 MED ORDER — RALOXIFENE HCL 60 MG PO TABS: 60.0000 mg | ORAL_TABLET | Freq: Every day | ORAL | Status: DC

## 2015-08-24 NOTE — Progress Notes (Signed)
Patient ID: Katie [name], female   DOB: [birth year], 82 y.o.   MRN: [id number]  [facility] Current Height: Height: 5\' 7"  (170.2 cm)      Max Lifetime Height:  5'7" Current Weight: Weight: 171 lb (77.565 kg)         HPI: Does pt already have a diagnosis of:  Osteopenia?  Yes Osteoporosis?  No  Back Pain?  Yes       Kyphosis?  No Prior fracture?  Yes - left ankle in 2013 / hariline fracture related to fall Med(s) for Osteoporosis/Osteopenia:  none Med(s) previously tried for Osteoporosis/Osteopenia:  none                                                             PMH: Age at menopause:  82yo Hysterectomy?  Yes Oophorectomy?  Yes - 1 ovary removed and other ovary remains HRT? Yes - Former.  Type/duration: estrogen took for 10 years Steroid Use?  No Thyroid med?  yes History of cancer?  No History of digestive disorders (ie Crohn's)?  Yes - GERD on PPI Current or previous eating disorders?  No Last Vitamin D Result:  46 (01/04/2013) Last GFR Result:  47 (08/14/2015)   FH/SH: Family history of osteoporosis?  No Parent with history of hip fracture?  No Family history of breast cancer?  No Exercise?  No - use to go to gym 3 times per week but has not been able to go due to caring for her husband Smoking?  No Alcohol?  No    Calcium Assessment Calcium Intake  # of servings/day  Calcium mg  Milk (8 oz) 0  x  300  = 0  Yogurt (4 oz) 0 x  200 = 0  Cheese (1 oz) 1 x  200 = 200mg   Other Calcium sources   250mg   Ca supplement 600mg  = 600mg    Estimated calcium intake per day 1050mg     DEXA Results Date of Test T-Score for AP Spine L1-L4 T-Score for Total Left Hip T-Score for Total Right Hip  08/14/2015 1.4 -1.3 -1.7  03/09/2013 1.1 -0.9 -0.6  01/09/2010 0.9 -0.2 -0.2        FRAX 10 year estimate: Total FX risk:  21%  (consider medication if >/= 20%) Hip FX risk:  5.1%  (consider medication if >/= 3%)  Assessment: Osteopenia with high fracture risk per FRAX  estimate  Recommendations: 1.  Start  reloxifine (EVISTA) 60mg  take 1 tablet daily 2.  recommend calcium 1200mg  daily through supplementation or diet.  3.  recommend weight bearing exercise - 30 minutes at least 4 days per week.   4.  Counseled and educated about fall risk and prevention.  Recheck DEXA:  2 years  Time spent counseling patient:  35 minutes   Chong January, PharmD, CPP Had planned to do AWV but patient was unable to stay longer due to paid caregiver at home with husband.

## 2015-08-24 NOTE — Patient Instructions (Addendum)
Exercise for Strong Bones  Exercise is important to build and maintain strong bones / bone density.  There are 2 types of exercises that are important to building and maintaining strong bones:  Weight- bearing and muscle-stregthening.  Weight-bearing Exercises  These exercises include activities that make you move against gravity while staying upright. Weight-bearing exercises can be high-impact or low-impact.  High-impact weight-bearing exercises help build bones and keep them strong. If you have broken a bone due to osteoporosis or are at risk of breaking a bone, you may need to avoid high-impact exercises. If you're not sure, you should check with your healthcare provider.  Examples of high-impact weight-bearing exercises are: Dancing  Doing high-impact aerobics  Hiking  Jogging/running  Jumping Rope  Stair climbing  Tennis  Low-impact weight-bearing exercises can also help keep bones strong and are a safe alternative if you cannot do high-impact exercises.   Examples of low-impact weight-bearing exercises are: Using elliptical training machines  Doing low-impact aerobics  Using stair-step machines  Fast walking on a treadmill or outside   Muscle-Strengthening Exercises These exercises include activities where you move your body, a weight or some other resistance against gravity. They are also known as resistance exercises and include: Lifting weights  Using elastic exercise bands  Using weight machines  Lifting your own body weight  Functional movements, such as standing and rising up on your toes  Yoga and Pilates can also improve strength, balance and flexibility. However, certain positions may not be safe for people with osteoporosis or those at increased risk of broken bones. For example, exercises that have you bend forward may increase the chance of breaking a bone in the spine.   Non-Impact Exercises There are other types of exercises that can help  prevent falls.  Non-impact exercises can help you to improve balance, posture and how well you move in everyday activities. Some of these exercises include: Balance exercises that strengthen your legs and test your balance, such as Tai Chi, can decrease your risk of falls.  Posture exercises that improve your posture and reduce rounded or "sloping" shoulders can help you decrease the chance of breaking a bone, especially in the spine.  Functional exercises that improve how well you move can help you with everyday activities and decrease your chance of falling and breaking a bone. For example, if you have trouble getting up from a chair or climbing stairs, you should do these activities as exercises.   **A physical therapist can teach you balance, posture and functional exercises. He/she can also help you learn which exercises are safe and appropriate for you.  [facility] has a physical therapy office in [place] in front of our office and referrals can be made for assessments and treatment as needed and strength and balance training.  If you would like to have an assessment with [facility] and our physical therapy team please let a nurse or provider know.    Fall Prevention in the Home  Falls can cause injuries and can affect people from all age groups. There are many simple things that you can do to make your home safe and to help prevent falls. WHAT CAN I DO ON THE OUTSIDE OF MY HOME?  Regularly repair the edges of walkways and driveways and fix any cracks.  Remove high doorway thresholds.  Trim any shrubbery on the main path into your home.  Use bright outdoor lighting.  Clear walkways of debris and clutter, including tools and rocks.  Regularly check   that handrails are securely fastened and in good repair. Both sides of any steps should have handrails.  Install guardrails along the edges of any raised decks or porches.  Have leaves, snow, and ice cleared regularly.  Use sand or salt on  walkways during winter months.  In the garage, clean up any spills right away, including grease or oil spills. WHAT CAN I DO IN THE BATHROOM?  Use night lights.  Install grab bars by the toilet and in the tub and shower. Do not use towel bars as grab bars.  Use non-skid mats or decals on the floor of the tub or shower.  If you need to sit down while you are in the shower, use a plastic, non-slip stool..  Keep the floor dry. Immediately clean up any water that spills on the floor.  Remove soap buildup in the tub or shower on a regular basis.  Attach bath mats securely with double-sided non-slip rug tape.  Remove throw rugs and other tripping hazards from the floor. WHAT CAN I DO IN THE BEDROOM?  Use night lights.  Make sure that a bedside light is easy to reach.  Do not use oversized bedding that drapes onto the floor.  Have a firm chair that has side arms to use for getting dressed.  Remove throw rugs and other tripping hazards from the floor. WHAT CAN I DO IN THE KITCHEN?   Clean up any spills right away.  Avoid walking on wet floors.  Place frequently used items in easy-to-reach places.  If you need to reach for something above you, use a sturdy step stool that has a grab bar.  Keep electrical cables out of the way.  Do not use floor polish or wax that makes floors slippery. If you have to use wax, make sure that it is non-skid floor wax.  Remove throw rugs and other tripping hazards from the floor. WHAT CAN I DO IN THE STAIRWAYS?  Do not leave any items on the stairs.  Make sure that there are handrails on both sides of the stairs. Fix handrails that are broken or loose. Make sure that handrails are as long as the stairways.  Check any carpeting to make sure that it is firmly attached to the stairs. Fix any carpet that is loose or worn.  Avoid having throw rugs at the top or bottom of stairways, or secure the rugs with carpet tape to prevent them from  moving.  Make sure that you have a light switch at the top of the stairs and the bottom of the stairs. If you do not have them, have them installed. WHAT ARE SOME OTHER FALL PREVENTION TIPS?  Wear closed-toe shoes that fit well and support your feet. Wear shoes that have rubber soles or low heels.  When you use a stepladder, make sure that it is completely opened and that the sides are firmly locked. Have someone hold the ladder while you are using it. Do not climb a closed stepladder.  Add color or contrast paint or tape to grab bars and handrails in your home. Place contrasting color strips on the first and last steps.  Use mobility aids as needed, such as canes, walkers, scooters, and crutches.  Turn on lights if it is dark. Replace any light bulbs that burn out.  Set up furniture so that there are clear paths. Keep the furniture in the same spot.  Fix any uneven floor surfaces.  Choose a carpet design that does not   hide the edge of steps of a stairway.  Be aware of any and all pets.  Review your medicines with your healthcare provider. Some medicines can cause dizziness or changes in blood pressure, which increase your risk of falling. Talk with your health care provider about other ways that you can decrease your risk of falls. This may include working with a physical therapist or trainer to improve your strength, balance, and endurance.   This information is not intended to replace advice given to you by your health care provider. Make sure you discuss any questions you have with your health care provider.   Document Released: 07/25/2002 Document Revised: 12/19/2014 Document Reviewed: 09/08/2014 Elsevier Interactive Patient Education 2016 [employer] Inc[employer]  

## 2015-08-27 ENCOUNTER — Other Ambulatory Visit: Payer: Self-pay | Admitting: Nurse Practitioner

## 2015-09-07 ENCOUNTER — Telehealth: Payer: Self-pay | Admitting: Nurse Practitioner

## 2015-09-07 DIAGNOSIS — E785 Hyperlipidemia, unspecified: Secondary | ICD-10-CM

## 2015-09-07 DIAGNOSIS — R609 Edema, unspecified: Secondary | ICD-10-CM

## 2015-09-07 DIAGNOSIS — I10 Essential (primary) hypertension: Secondary | ICD-10-CM

## 2015-09-07 DIAGNOSIS — K219 Gastro-esophageal reflux disease without esophagitis: Secondary | ICD-10-CM

## 2015-09-07 DIAGNOSIS — G8929 Other chronic pain: Secondary | ICD-10-CM

## 2015-09-07 DIAGNOSIS — M549 Dorsalgia, unspecified: Secondary | ICD-10-CM

## 2015-09-07 DIAGNOSIS — E039 Hypothyroidism, unspecified: Secondary | ICD-10-CM

## 2015-09-07 DIAGNOSIS — E876 Hypokalemia: Secondary | ICD-10-CM

## 2015-09-10 MED ORDER — BENAZEPRIL HCL 40 MG PO TABS: 40.0000 mg | ORAL_TABLET | Freq: Every day | ORAL | Status: DC

## 2015-09-10 MED ORDER — METOPROLOL TARTRATE 25 MG PO TABS: 25.0000 mg | ORAL_TABLET | Freq: Two times a day (BID) | ORAL | Status: DC

## 2015-09-10 MED ORDER — POTASSIUM CHLORIDE CRYS ER 20 MEQ PO TBCR: 20.0000 meq | EXTENDED_RELEASE_TABLET | Freq: Every day | ORAL | Status: DC

## 2015-09-10 MED ORDER — FUROSEMIDE 40 MG PO TABS: 40.0000 mg | ORAL_TABLET | Freq: Every day | ORAL | Status: DC

## 2015-09-10 MED ORDER — LOVASTATIN 40 MG PO TABS: 80.0000 mg | ORAL_TABLET | Freq: Every day | ORAL | Status: DC

## 2015-09-10 MED ORDER — LEVOTHYROXINE SODIUM 50 MCG PO TABS: 50.0000 ug | ORAL_TABLET | Freq: Every day | ORAL | Status: DC

## 2015-09-10 MED ORDER — ETODOLAC 400 MG PO TABS: 400.0000 mg | ORAL_TABLET | Freq: Two times a day (BID) | ORAL | Status: DC

## 2015-09-10 MED ORDER — OMEPRAZOLE 40 MG PO CPDR: 40.0000 mg | DELAYED_RELEASE_CAPSULE | Freq: Every day | ORAL | Status: DC

## 2015-09-10 MED ORDER — AMLODIPINE BESYLATE 10 MG PO TABS: 5.0000 mg | ORAL_TABLET | Freq: Every day | ORAL | Status: DC

## 2015-09-10 MED ORDER — RALOXIFENE HCL 60 MG PO TABS: 60.0000 mg | ORAL_TABLET | Freq: Every day | ORAL | Status: DC

## 2015-09-10 NOTE — Telephone Encounter (Signed)
done

## 2015-09-19 DIAGNOSIS — M4806 Spinal stenosis, lumbar region: Secondary | ICD-10-CM | POA: Diagnosis not present

## 2015-09-19 DIAGNOSIS — M5416 Radiculopathy, lumbar region: Secondary | ICD-10-CM | POA: Diagnosis not present

## 2015-09-26 DIAGNOSIS — M4806 Spinal stenosis, lumbar region: Secondary | ICD-10-CM | POA: Diagnosis not present

## 2015-09-26 DIAGNOSIS — M5416 Radiculopathy, lumbar region: Secondary | ICD-10-CM | POA: Diagnosis not present

## 2015-10-11 ENCOUNTER — Other Ambulatory Visit: Payer: Self-pay

## 2015-10-11 DIAGNOSIS — M545 Low back pain, unspecified: Secondary | ICD-10-CM

## 2015-10-11 MED ORDER — TRAMADOL HCL 50 MG PO TABS: 50.0000 mg | ORAL_TABLET | Freq: Every day | ORAL | Status: DC

## 2015-10-11 NOTE — Telephone Encounter (Signed)
rx ready for pickup 

## 2015-10-11 NOTE — Telephone Encounter (Signed)
Patient notified that rx up front and ready for pick up 

## 2015-10-11 NOTE — Telephone Encounter (Signed)
last seen 08/14/15  MMM  If approved print for mail order

## 2015-11-13 ENCOUNTER — Ambulatory Visit (INDEPENDENT_AMBULATORY_CARE_PROVIDER_SITE_OTHER): Payer: Medicare Other | Admitting: Nurse Practitioner

## 2015-11-13 ENCOUNTER — Encounter: Payer: Self-pay | Admitting: Nurse Practitioner

## 2015-11-13 VITALS — BP 142/86 | HR 61 | Temp 97.5°F | Ht 67.0 in | Wt 170.0 lb

## 2015-11-13 DIAGNOSIS — I10 Essential (primary) hypertension: Secondary | ICD-10-CM

## 2015-11-13 DIAGNOSIS — E039 Hypothyroidism, unspecified: Secondary | ICD-10-CM | POA: Diagnosis not present

## 2015-11-13 DIAGNOSIS — Z6827 Body mass index (BMI) 27.0-27.9, adult: Secondary | ICD-10-CM

## 2015-11-13 DIAGNOSIS — M545 Low back pain, unspecified: Secondary | ICD-10-CM

## 2015-11-13 DIAGNOSIS — R609 Edema, unspecified: Secondary | ICD-10-CM | POA: Diagnosis not present

## 2015-11-13 DIAGNOSIS — N183 Chronic kidney disease, stage 3 unspecified: Secondary | ICD-10-CM

## 2015-11-13 DIAGNOSIS — R300 Dysuria: Secondary | ICD-10-CM

## 2015-11-13 DIAGNOSIS — K5900 Constipation, unspecified: Secondary | ICD-10-CM

## 2015-11-13 DIAGNOSIS — M549 Dorsalgia, unspecified: Secondary | ICD-10-CM

## 2015-11-13 DIAGNOSIS — K219 Gastro-esophageal reflux disease without esophagitis: Secondary | ICD-10-CM

## 2015-11-13 DIAGNOSIS — N3001 Acute cystitis with hematuria: Secondary | ICD-10-CM

## 2015-11-13 DIAGNOSIS — G8929 Other chronic pain: Secondary | ICD-10-CM

## 2015-11-13 DIAGNOSIS — E785 Hyperlipidemia, unspecified: Secondary | ICD-10-CM | POA: Diagnosis not present

## 2015-11-13 DIAGNOSIS — E119 Type 2 diabetes mellitus without complications: Secondary | ICD-10-CM | POA: Diagnosis not present

## 2015-11-13 DIAGNOSIS — E876 Hypokalemia: Secondary | ICD-10-CM | POA: Diagnosis not present

## 2015-11-13 DIAGNOSIS — R600 Localized edema: Secondary | ICD-10-CM

## 2015-11-13 LAB — URINALYSIS, COMPLETE
BILIRUBIN UA: POSITIVE — AB
Glucose, UA: NEGATIVE
Ketones, UA: NEGATIVE
Nitrite, UA: NEGATIVE
PH UA: 5.5 (ref 5.0–7.5)
Protein, UA: NEGATIVE
SPEC GRAV UA: 1.01 (ref 1.005–1.030)
UUROB: 0.2 mg/dL (ref 0.2–1.0)

## 2015-11-13 LAB — CMP14+EGFR
ALBUMIN: 3.8 g/dL (ref 3.5–4.7)
ALT: 9 IU/L (ref 0–32)
AST: 13 IU/L (ref 0–40)
Albumin/Globulin Ratio: 1.5 (ref 1.2–2.2)
Alkaline Phosphatase: 70 IU/L (ref 39–117)
BUN/Creatinine Ratio: 15 (ref 11–26)
BUN: 15 mg/dL (ref 8–27)
Bilirubin Total: 0.4 mg/dL (ref 0.0–1.2)
CO2: 23 mmol/L (ref 18–29)
CREATININE: 0.99 mg/dL (ref 0.57–1.00)
Calcium: 9.1 mg/dL (ref 8.7–10.3)
Chloride: 101 mmol/L (ref 96–106)
GFR, EST AFRICAN AMERICAN: 61 mL/min/{1.73_m2} (ref >59)
GFR, EST NON AFRICAN AMERICAN: 53 mL/min/{1.73_m2} — AB (ref >59)
GLUCOSE: 127 mg/dL — AB (ref 65–99)
Globulin, Total: 2.5 g/dL (ref 1.5–4.5)
POTASSIUM: 4.3 mmol/L (ref 3.5–5.2)
Sodium: 140 mmol/L (ref 134–144)
TOTAL PROTEIN: 6.3 g/dL (ref 6.0–8.5)

## 2015-11-13 LAB — MICROSCOPIC EXAMINATION

## 2015-11-13 LAB — LIPID PANEL
CHOL/HDL RATIO: 2.3 ratio (ref 0.0–4.4)
Cholesterol, Total: 135 mg/dL (ref 100–199)
HDL: 58 mg/dL (ref >39)
LDL CALC: 60 mg/dL (ref 0–99)
TRIGLYCERIDES: 86 mg/dL (ref 0–149)
VLDL CHOLESTEROL CAL: 17 mg/dL (ref 5–40)

## 2015-11-13 LAB — BAYER DCA HB A1C WAIVED: HB A1C: 7 % — AB (ref <7.0)

## 2015-11-13 MED ORDER — ETODOLAC 400 MG PO TABS: 400.0000 mg | ORAL_TABLET | Freq: Two times a day (BID) | ORAL | Status: DC

## 2015-11-13 MED ORDER — TRAMADOL HCL 50 MG PO TABS: 50.0000 mg | ORAL_TABLET | Freq: Every day | ORAL | Status: DC

## 2015-11-13 MED ORDER — CIPROFLOXACIN HCL 500 MG PO TABS: 500.0000 mg | ORAL_TABLET | Freq: Two times a day (BID) | ORAL | Status: DC

## 2015-11-13 MED ORDER — PHENAZOPYRIDINE HCL 100 MG PO TABS: 100.0000 mg | ORAL_TABLET | Freq: Three times a day (TID) | ORAL | Status: DC | PRN

## 2015-11-13 NOTE — Patient Instructions (Signed)
Bone Health Bones protect organs, store calcium, and anchor muscles. Good health habits, such as eating nutritious foods and exercising regularly, are important for maintaining healthy bones. They can also help to prevent a condition that causes bones to lose density and become weak and brittle (osteoporosis). WHY IS BONE MASS IMPORTANT? Bone mass refers to the amount of bone tissue that you have. The higher your bone mass, the stronger your bones. An important step toward having healthy bones throughout life is to have strong and dense bones during childhood. A young adult who has a high bone mass is more likely to have a high bone mass later in life. Bone mass at its greatest it is called peak bone mass. A large decline in bone mass occurs in older adults. In women, it occurs about the time of menopause. During this time, it is important to practice good health habits, because if more bone is lost than what is replaced, the bones will become less healthy and more likely to break (fracture). If you find that you have a low bone mass, you may be able to prevent osteoporosis or further bone loss by changing your diet and lifestyle. HOW CAN I FIND OUT IF MY BONE MASS IS LOW? Bone mass can be measured with an X-ray test that is called a bone mineral density (BMD) test. This test is recommended for all women who are age 65 or older. It may also be recommended for men who are age 70 or older, or for people who are more likely to develop osteoporosis due to:  Having bones that break easily.  Having a long-term disease that weakens bones, such as kidney disease or rheumatoid arthritis.  Having menopause earlier than normal.  Taking medicine that weakens bones, such as steroids, thyroid hormones, or hormone treatment for breast cancer or prostate cancer.  Smoking.  Drinking three or more alcoholic drinks each day. WHAT ARE THE NUTRITIONAL RECOMMENDATIONS FOR HEALTHY BONES? To have healthy bones, you need  to get enough of the right minerals and vitamins. Most nutrition experts recommend getting these nutrients from the foods that you eat. Nutritional recommendations vary from person to person. Ask your health care provider what is healthy for you. Here are some general guidelines. Calcium Recommendations Calcium is the most important (essential) mineral for bone health. Most people can get enough calcium from their diet, but supplements may be recommended for people who are at risk for osteoporosis. Good sources of calcium include:  Dairy products, such as low-fat or nonfat milk, cheese, and yogurt.  Dark green leafy vegetables, such as bok choy and broccoli.  Calcium-fortified foods, such as orange juice, cereal, bread, soy beverages, and tofu products.  Nuts, such as almonds. Follow these recommended amounts for daily calcium intake:  Children, age 1-3: 700 mg.  Children, age 4-8: 1,000 mg.  Children, age 9-13: 1,300 mg.  Teens, age 14-18: 1,300 mg.  Adults, age 19-50: 1,000 mg.  Adults, age 51-70:  Men: 1,000 mg.  Women: 1,200 mg.  Adults, age 71 or older: 1,200 mg.  Pregnant and breastfeeding females:  Teens: 1,300 mg.  Adults: 1,000 mg. Vitamin D Recommendations Vitamin D is the most essential vitamin for bone health. It helps the body to absorb calcium. Sunlight stimulates the skin to make vitamin D, so be sure to get enough sunlight. If you live in a cold climate or you do not get outside often, your health care provider may recommend that you take vitamin D supplements. Good   sources of vitamin D in your diet include:  Egg yolks.  Saltwater fish.  Milk and cereal fortified with vitamin D. Follow these recommended amounts for daily vitamin D intake:  Children and teens, age 1-18: 600 international units.  Adults, age 50 or younger: 400-800 international units.  Adults, age 51 or older: 800-1,000 international units. Other Nutrients Other nutrients for bone  health include:  Phosphorus. This mineral is found in meat, poultry, dairy foods, nuts, and legumes. The recommended daily intake for adult men and adult women is 700 mg.  Magnesium. This mineral is found in seeds, nuts, dark green vegetables, and legumes. The recommended daily intake for adult men is 400-420 mg. For adult women, it is 310-320 mg.  Vitamin K. This vitamin is found in green leafy vegetables. The recommended daily intake is 120 mg for adult men and 90 mg for adult women. WHAT TYPE OF PHYSICAL ACTIVITY IS BEST FOR BUILDING AND MAINTAINING HEALTHY BONES? Weight-bearing and strength-building activities are important for building and maintaining peak bone mass. Weight-bearing activities cause muscles and bones to work against gravity. Strength-building activities increases muscle strength that supports bones. Weight-bearing and muscle-building activities include:  Walking and hiking.  Jogging and running.  Dancing.  Gym exercises.  Lifting weights.  Tennis and racquetball.  Climbing stairs.  Aerobics. Adults should get at least 30 minutes of moderate physical activity on most days. Children should get at least 60 minutes of moderate physical activity on most days. Ask your health care provide what type of exercise is best for you. WHERE CAN I FIND MORE INFORMATION? For more information, check out the following websites:  National Osteoporosis Foundation: http://nof.org/learn/basics  National Institutes of Health: http://www.niams.nih.gov/Health_Info/Bone/Bone_Health/bone_health_for_life.asp   This information is not intended to replace advice given to you by your health care provider. Make sure you discuss any questions you have with your health care provider.   Document Released: 10/25/2003 Document Revised: 12/19/2014 Document Reviewed: 08/09/2014 Elsevier Interactive Patient Education 2016 [employer]  

## 2015-11-13 NOTE — Addendum Note (Signed)
Addended by: [name] on: 11/13/2015 12:11 PM   Modules accepted: Orders, SmartSet

## 2015-11-13 NOTE — Progress Notes (Signed)
Subjective:    Patient ID: Katie [name], female    DOB: 07/03/1932, 82 y.o.   MRN: [id number]  Patient here today for follow up of chronic medical problems.  Outpatient Encounter Prescriptions as of 11/13/2015  Medication Sig  . amLODipine (NORVASC) 10 MG tablet Take 0.5 tablets (5 mg total) by mouth daily.  [name] aspirin (SB LOW DOSE ASA EC) 81 MG EC tablet Take 81 mg by mouth daily.    . benazepril (LOTENSIN) 40 MG tablet Take 1 tablet (40 mg total) by mouth daily.  . calcium carbonate (OS-CAL) 600 MG TABS Take 600 mg by mouth daily.   . Cholecalciferol (VITAMIN D) 2000 UNITS CAPS Take 2,000 Units by mouth daily.   [name] etodolac (LODINE) 400 MG tablet Take 1 tablet (400 mg total) by mouth 2 (two) times daily.  . furosemide (LASIX) 40 MG tablet Take 1 tablet (40 mg total) by mouth daily.  [name] levothyroxine (SYNTHROID, LEVOTHROID) 50 MCG tablet Take 1 tablet (50 mcg total) by mouth daily before breakfast.  . lovastatin (MEVACOR) 40 MG tablet Take 2 tablets (80 mg total) by mouth at bedtime.  . metoprolol tartrate (LOPRESSOR) 25 MG tablet Take 1 tablet (25 mg total) by mouth 2 (two) times daily.  [name] omeprazole (PRILOSEC) 40 MG capsule Take 1 capsule (40 mg total) by mouth daily.  . potassium chloride SA (KLOR-CON M20) 20 MEQ tablet Take 1 tablet (20 mEq total) by mouth daily.  . raloxifene (EVISTA) 60 MG tablet Take 1 tablet (60 mg total) by mouth daily. For bones  . traMADol (ULTRAM) 50 MG tablet Take 1 tablet (50 mg total) by mouth daily.   No facility-administered encounter medications on file as of 11/13/2015.      Dysuria  This is a new problem. The current episode started in the past 7 days. The problem occurs intermittently. The problem has been gradually worsening. The quality of the pain is described as burning. The pain is at a severity of 4/10. The pain is mild. There has been no fever. She is not sexually active. There is no history of pyelonephritis. Pertinent negatives include no  chills or hematuria. She has tried nothing for the symptoms.  Hypertension This is a chronic problem. The current episode started more than 1 year ago. The problem is unchanged. The problem is controlled. Pertinent negatives include no chest pain, headaches, neck pain, palpitations, peripheral edema or shortness of breath. Risk factors for coronary artery disease include sedentary lifestyle, post-menopausal state, dyslipidemia and diabetes mellitus. Past treatments include ACE inhibitors, calcium channel blockers and diuretics. The current treatment provides moderate improvement. There are no compliance problems.  Hypertensive end-organ damage includes a thyroid problem.  Hyperlipidemia This is a chronic problem. The current episode started more than 1 year ago. The problem is uncontrolled. Recent lipid tests were reviewed and are high. Exacerbating diseases include diabetes and hypothyroidism. She has no history of obesity. Pertinent negatives include no chest pain, myalgias or shortness of breath. Current antihyperlipidemic treatment includes statins. The current treatment provides moderate improvement of lipids. Compliance problems include adherence to diet and adherence to exercise.  Risk factors for coronary artery disease include dyslipidemia, family history, hypertension and post-menopausal.  Diabetes She presents for her follow-up diabetic visit. She has type 2 diabetes mellitus. No MedicAlert identification noted. Her disease course has been stable. Pertinent negatives for hypoglycemia include no headaches. Pertinent negatives for diabetes include no chest pain, no visual change and no weakness. Symptoms are stable.  Risk factors for coronary artery disease include diabetes mellitus, dyslipidemia, family history, hypertension and post-menopausal. Current diabetic treatment includes diet. She is compliant with treatment most of the time. When asked about meal planning, she reported none. She has not had  a previous visit with a dietitian. She rarely participates in exercise. Home blood sugar record trend: patient has been on steroids 2x in the last 3 months and blood sugars were high while taking . Her breakfast blood glucose is taken between 8-9 am. Her breakfast blood glucose range is generally 140-180 mg/dl. (Patient does not check daily blood sugars) An ACE inhibitor/angiotensin II receptor blocker is being taken. She does not see a podiatrist.Eye exam is not current.  Thyroid Problem Presents for follow-up (hypothyroidism) visit. Symptoms include constipation. Patient reports no palpitations or visual change. The symptoms have been stable. Her past medical history is significant for diabetes and hyperlipidemia.  GERD Omeprazole keeps symptoms under control Hypokalemia Potassium supplements daily- no c/o lower ext cramping Peripheral edema Lasix keeps under control- since decreased amlodopine to 5 mg daily has gotten much better Chronic back pain Flexeril, lodine and ultram as needed- somedays she does not have to take at all. 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 Constipation  Patient currently not doing anything for constipation CKD stage III Currently just watching lab results  Review of Systems  Constitutional: Negative for chills.  Respiratory: Negative for shortness of breath.   Cardiovascular: Negative for chest pain and palpitations.  Gastrointestinal: Positive for constipation.  Genitourinary: Positive for dysuria. Negative for hematuria.  Musculoskeletal: Negative for myalgias and neck pain.  Neurological: Negative for weakness and headaches.  Psychiatric/Behavioral: Negative.   All other systems reviewed and are negative.      Objective:   Physical Exam  Constitutional: She is oriented to person, place, and time. She appears well-developed and well-nourished.  HENT:  Nose: Nose normal.  Mouth/Throat: Oropharynx is clear and moist.  Eyes: EOM are  normal.  Neck: Trachea normal, normal range of motion and full passive range of motion without pain. Neck supple. No JVD present. Carotid bruit is not present. No thyromegaly present.  Cardiovascular: Normal rate, normal heart sounds and intact distal pulses.  Exam reveals no gallop and no friction rub.   No murmur heard. Irregular rhythym  Pulmonary/Chest: Effort normal and breath sounds normal.  Abdominal: Soft. Bowel sounds are normal. She exhibits no distension and no mass. There is no tenderness.  Musculoskeletal: Normal range of motion. She exhibits edema (bil lower extremities, +1. ).  Lymphadenopathy:    She has no cervical adenopathy.  Neurological: She is alert and oriented to person, place, and time. She has normal reflexes.  Skin: Skin is warm and dry.  Psychiatric: She has a normal mood and affect. Her behavior is normal. Judgment and thought content normal.   BP 142/86 mmHg  Pulse 61  Temp(Src) 97.5 F (36.4 C) (Oral)  Ht '5\' 7"'  (1.702 m)  Wt 170 lb (77.111 kg)  BMI 26.62 kg/m2   hgba1c- 7.0%-- up from 6.5%  Urine- positive leuks and moderate blood    Assessment & Plan:   1. Essential hypertension Do not add slat to diet - CMP14+EGFR  2. Hyperlipidemia Low fat diet - Lipid panel  3. Diabetes mellitus type 2, diet-controlled ([place]) hgba1c increasing- patient does not want ogo on medds'Will do stricter carb diet and we will recheck at next visit - Bayer DCA Hb A1c Waived  4. Dysuria/UTI Take  medication as prescribe Cotton underwear Take shower not bath Cranberry juice, yogurt Force fluids AZO over the counter X2 days Culture pending RTO prn  - Urinalysis, Complete  5. Gastroesophageal reflux disease, esophagitis presence not specified Avoid spicy foods Do not eat 2 hours prior to bedtime  6. Hypothyroidism, unspecified hypothyroidism type  7. Constipation, unspecified constipation type Encouraged to do miralax a couple of times a week  8. CKD  (chronic kidney disease) stage 3, GFR 30-59 ml/min  9. Hypokalemia  10. Peripheral edema elevate legs when sitting  11. BMI 27.0-27.9,adult Discussed diet and exercise for person with BMI >25 Will recheck weight in 3-6 months  12. Midline low back pain without sciatica - traMADol (ULTRAM) 50 MG tablet; Take 1 tablet (50 mg total) by mouth daily.  Dispense: 90 tablet; Refill: 0    Labs pending Health maintenance reviewed Diet and exercise encouraged Continue all meds Follow up  In 3 months   [place], FNP

## 2015-11-17 LAB — URINE CULTURE

## 2015-12-31 ENCOUNTER — Telehealth: Payer: Self-pay | Admitting: Nurse Practitioner

## 2015-12-31 DIAGNOSIS — M545 Low back pain, unspecified: Secondary | ICD-10-CM

## 2015-12-31 MED ORDER — TRAMADOL HCL 50 MG PO TABS: 50.0000 mg | ORAL_TABLET | Freq: Two times a day (BID) | ORAL | Status: DC | PRN

## 2015-12-31 NOTE — Telephone Encounter (Signed)
Patient aware rx is ready for pickup. 

## 2015-12-31 NOTE — Telephone Encounter (Signed)
Way too early!!!!

## 2016-01-11 ENCOUNTER — Telehealth: Payer: Self-pay | Admitting: Nurse Practitioner

## 2016-01-11 NOTE — Telephone Encounter (Signed)
Appt made to see MMM

## 2016-01-15 ENCOUNTER — Ambulatory Visit (INDEPENDENT_AMBULATORY_CARE_PROVIDER_SITE_OTHER): Payer: Medicare Other | Admitting: Nurse Practitioner

## 2016-01-15 ENCOUNTER — Encounter: Payer: Self-pay | Admitting: Nurse Practitioner

## 2016-01-15 VITALS — BP 152/70 | HR 55 | Temp 97.4°F | Ht 67.0 in | Wt 169.5 lb

## 2016-01-15 DIAGNOSIS — I4891 Unspecified atrial fibrillation: Secondary | ICD-10-CM | POA: Diagnosis not present

## 2016-01-15 DIAGNOSIS — I499 Cardiac arrhythmia, unspecified: Secondary | ICD-10-CM | POA: Diagnosis not present

## 2016-01-15 DIAGNOSIS — I4892 Unspecified atrial flutter: Secondary | ICD-10-CM | POA: Diagnosis not present

## 2016-01-15 DIAGNOSIS — R609 Edema, unspecified: Secondary | ICD-10-CM | POA: Diagnosis not present

## 2016-01-15 MED ORDER — CIPROFLOXACIN HCL 500 MG PO TABS: 500.0000 mg | ORAL_TABLET | Freq: Two times a day (BID) | ORAL | Status: DC

## 2016-01-15 NOTE — Patient Instructions (Signed)

## 2016-01-15 NOTE — Progress Notes (Signed)
   Subjective:    Patient ID: Katie [name], female    DOB: [birth year], 82 y.o.   MRN: [id number]  HPI  PAtient come sin c/o bil ankle and foot swelling- Started about 1 month ago- on lasix which helps. Usually resolves by time she gets up in mornings. SHe says by evening they are so swollen that it hurts to walk. She also says that it feels like her heart was skipping a beat over the weekend. This happened in the past and she was put on metoprolol. Has not had any problems until this past weekend. She says she could only feel it at night, during the day she does not notice it. Denies any SOB or chest pain. She last saw  her cardiologist in Oct 2016.     Review of Systems  Constitutional: Negative.   HENT: Negative.   Respiratory: Negative for chest tightness and shortness of breath.   Cardiovascular: Positive for palpitations and leg swelling. Negative for chest pain.  Gastrointestinal: Negative.   Genitourinary: Negative.   Neurological: Negative.   Psychiatric/Behavioral: Negative.   All other systems reviewed and are negative.      Objective:   Physical Exam  Constitutional: She is oriented to person, place, and time. She appears well-developed and well-nourished. No distress.  Cardiovascular: Exam reveals no gallop and no friction rub.   No murmur heard. irreglar heart beat  Pulmonary/Chest: Effort normal and breath sounds normal.  Neurological: She is alert and oriented to person, place, and time.  Skin: Skin is warm.  Psychiatric: She has a normal mood and affect. Her behavior is normal. Thought content normal.   BP 152/70 mmHg  Pulse 55  Temp(Src) 97.4 F (36.3 C) (Oral)  Ht 5\' 7"  (1.702 m)  Wt 169 lb 8 oz (76.885 kg)  BMI 26.54 kg/m2   EKG- atrial fib flutter- new Precious Reel, FNP      Assessment & Plan:   1. Peripheral edema   2. Irregular heart beat   3. Atrial fibrillation and flutter ([facility])    Continue lasix as rx Referral made to  cardiology Avoid caffeine If develop chest pain or sudden onset SOB go  To ER Continue baby ASA daily  Mary-Margaret Hassell Done, FNP

## 2016-01-17 ENCOUNTER — Other Ambulatory Visit: Payer: Self-pay | Admitting: Nurse Practitioner

## 2016-01-22 ENCOUNTER — Ambulatory Visit: Payer: Medicare Other | Admitting: Cardiovascular Disease

## 2016-01-31 ENCOUNTER — Other Ambulatory Visit: Payer: Self-pay | Admitting: Nurse Practitioner

## 2016-02-08 ENCOUNTER — Encounter: Payer: Self-pay | Admitting: Physician Assistant

## 2016-02-08 NOTE — Progress Notes (Signed)
Cardiology Office Note    Date:  02/11/2016  ID:  Katie [name], DOB 05-21-1932, MRN [id number] PCP:  [name], FNP  Cardiologist: Dr. Angelena Form   Chief Complaint: evaluate atrial fib vs flutter  History of Present Illness:  Katie [name] is a 82 y.o. female with history of frequent PACs/PVCs by prior Holter, mild AI by echo 05/2015, HTN, HLD, DM, GERD, hiatal hernia, hypothyroidism who presents for follow-up. 2D Echo 05/2015 showed EF 60-65%, no RWMA, grade 1 DD, moderate AI. Per notes she was previously on Cardizem for her ectopy but stopped it because of swelling. She saw her PCP 01/15/16 for evaluation of lower extremity edema at which time she was found to have new aflutter/coarse afib. Her amlodipine was cut down to 5mg  daily. She was continued on aspirin.  She presents to clinic for further evaluation. She is in NSR with PACs today. She has noticed occasional palpitations when lying down at night. They are not particularly othersome to her. There has been no change in her palpitations from when she was previously diagnosed with PACs/PVCs. Her ankle edema persists. She states she is on her feet all the time caring for her husband. She does report increased emotional stress. She does drink alcohol or use caffeine. She has not had any chest pain or SOB.   Past Medical History  Diagnosis Date  . Hypertension   . Hypercholesterolemia   . GERD (gastroesophageal reflux disease)   . Hiatal hernia   . Obesity   . Hypothyroidism   . NIDDM (non-insulin dependent diabetes mellitus)     diet controlled   . Left knee pain   . URI (upper respiratory infection)   . Hypokalemia   . Edema   . Vitamin D deficiency   . Premature atrial contractions   . PVC's (premature ventricular contractions)   . Aortic insufficiency     a. Mod AI by echo 05/2015.  [name] Atrial fibrillation and flutter ([place])     a. Coarse afib vs flutter by EKG 12/2015.    Past Surgical History  Procedure  Laterality Date  . Total abdominal hysterectomy w/ bilateral salpingoophorectomy  1980  . Appendectomy  1980  . Tonsillectomy    . Cholecystectomy  5/00  . Back surgery    . Colonoscopy    . Upper gastrointestinal endoscopy      Current Medications: Current Outpatient Prescriptions  Medication Sig Dispense Refill  . amLODipine (NORVASC) 10 MG tablet Take 0.5 tablets (5 mg total) by mouth daily. 90 tablet 1  . aspirin (SB LOW DOSE ASA EC) 81 MG EC tablet Take 81 mg by mouth daily.      . benazepril (LOTENSIN) 40 MG tablet Take 1 tablet (40 mg total) by mouth daily. 90 tablet 1  . calcium carbonate (OS-CAL) 600 MG TABS Take 600 mg by mouth daily.     . Cholecalciferol (VITAMIN D) 2000 UNITS CAPS Take 2,000 Units by mouth daily.     [name] etodolac (LODINE) 400 MG tablet Take 1 tablet by mouth two  times daily 180 tablet 0  . furosemide (LASIX) 40 MG tablet Take 1 tablet (40 mg total) by mouth daily. 90 tablet 1  . levothyroxine (SYNTHROID, LEVOTHROID) 50 MCG tablet Take 1 tablet (50 mcg total) by mouth daily before breakfast. 90 tablet 2  . lovastatin (MEVACOR) 40 MG tablet Take 2 tablets (80 mg total) by mouth at bedtime. 180 tablet 1  . metoprolol tartrate (LOPRESSOR) 25 MG tablet  Take 1 tablet (25 mg total) by mouth 2 (two) times daily. 180 tablet 1  . omeprazole (PRILOSEC) 40 MG capsule Take 1 capsule (40 mg total) by mouth daily. 90 capsule 1  . potassium chloride SA (KLOR-CON M20) 20 MEQ tablet Take 1 tablet (20 mEq total) by mouth daily. 90 tablet 1  . raloxifene (EVISTA) 60 MG tablet TAKE 1 TABLET BY MOUTH  DAILY FOR BONES 90 tablet 1  . traMADol (ULTRAM) 50 MG tablet Take 50 mg by mouth every 6 (six) hours as needed for moderate pain or severe pain.     No current facility-administered medications for this visit.     Allergies:   Macrodantin; Metformin and related; and Penicillins   Social History   Social History  . Marital Status: Married    Spouse Name: N/A  . Number of  Children: 2  . Years of Education: N/A   Occupational History  . Retired    Social History Main Topics  . Smoking status: Never Smoker   . Smokeless tobacco: Never Used  . Alcohol Use: No  . Drug Use: No  . Sexual Activity: Not Asked   Other Topics Concern  . None   Social History Narrative     Family History:  The patient's family history includes Colon cancer in her sister; Diabetes in her brother, mother, and sister; Heart disease in her father; Ovarian cancer in her sister; Stroke in her mother; Uterine cancer in her sister. There is no history of Heart attack or Hypertension.   ROS:   Please see the history of present illness.  All other systems are reviewed and otherwise negative.    PHYSICAL EXAM:   VS:  BP 130/70 mmHg  Pulse 66  Ht 5\' 7"  (1.702 m)  Wt 169 lb 12.8 oz (77.021 kg)  BMI 26.59 kg/m2  SpO2 94%  BMI: Body mass index is 26.59 kg/(m^2). GEN: Well nourished, well developed WF, in no acute distress HEENT: normocephalic, atraumatic Neck: no JVD, carotid bruits, or masses Cardiac: RRR; no murmurs, rubs, or gallops, tr-1+ soft puffy bilateral LE/ankle edema Respiratory:  clear to auscultation bilaterally, normal work of breathing GI: soft, nontender, nondistended, + BS MS: no deformity or atrophy Skin: warm and dry, no rash Neuro:  Alert and Oriented x 3, Strength and sensation are intact, follows commands Psych: euthymic mood, full affect  Wt Readings from Last 3 Encounters:  02/11/16 169 lb 12.8 oz (77.021 kg)  01/15/16 169 lb 8 oz (76.885 kg)  11/13/15 170 lb (77.111 kg)      Studies/Labs Reviewed:   EKG:  EKG was ordered today and personally reviewed by me and demonstrates NSR 66bpm, PACs, PVCs, 66bpm ,QTc 451ms EKG from 01/15/16 showed atrial flutter versus coarse afib 80bpm  Recent Labs: 05/11/2015: TSH 3.180 11/13/2015: ALT 9; BUN 15; Creatinine, Ser 0.99; Potassium 4.3; Sodium 140   Lipid Panel    Component Value Date/Time   CHOL 135  11/13/2015 1139   CHOL 139 10/18/2014 1435   CHOL 158 01/04/2013 0905   TRIG 86 11/13/2015 1139   TRIG 125 10/18/2014 1435   TRIG 184* 01/04/2013 0905   HDL 58 11/13/2015 1139   HDL 54 10/18/2014 1435   HDL 50 01/04/2013 0905   CHOLHDL 2.3 11/13/2015 1139   LDLCALC 60 11/13/2015 1139   LDLCALC 80 03/15/2014 1024   LDLCALC 71 01/04/2013 0905    Additional studies/ records that were reviewed today include: Summarized above.    ASSESSMENT &  PLAN:   1. Paroxysmal atrial fib/flutter - Rhythm on 01/15/16 appeared to atrial flutter but with slight irregularity thus could represent coarse afib instead. Regardless, she is in NSR today so this is a paroxysmal process. She has been minimally symptomatic with this. HR was 80 when she was out of rhythm. Her biggest complaint is LEE. Will update 2D echocardiogram and check labs including CMET, CBC, BNP, TSH, Mg. If BNP is normal I suspect amlodipine is the culprit for her edema and will likely plan to discontinue this medicine in lieu of titrating another. If LV function is normal, I would favor conservative strategy of rate control for now. We discussed blood thinner therapy today. She does not appear to have any contraindication to blood thinners - no bleeding, frequent falls, or history of significant anemia. Anticoagulation is warranted given CHADSVASC = 5 indicating significant risk of stroke. Her husband was on Eliquis and she states it was very expensive with their insurance. She does not want to go on Coumadin as she remembers the frequent checks her sister required while on it. Will check labs and plan to start Xarelto and stop aspirin once we know kidney function. Risks/benefits discussed with the patient. 2. H/o frequent PACs/PVCs - check lytes as above. Minimally symptomatic. 3. Aortic insufficiency - f/u by echo. 4. HTN - see above. Follow. 5. Hyperlipidemia - followed by PCP.   Disposition: F/u with me or Dr. Angelena Form in 6-8 weeks if  available, otherwise another APP.   Medication Adjustments/Labs and Tests Ordered: Current medicines are reviewed at length with the patient today.  Concerns regarding medicines are outlined above. Medication changes, Labs and Tests ordered today are summarized above and listed in the Patient Instructions accessible in Encounters.   Raechel Ache PA-C  02/11/2016 10:38 AM    [facility], [place], [place]  [address] Phone: [phone]; Fax: [phone]

## 2016-02-11 ENCOUNTER — Ambulatory Visit (INDEPENDENT_AMBULATORY_CARE_PROVIDER_SITE_OTHER): Payer: Medicare Other | Admitting: Physician Assistant

## 2016-02-11 ENCOUNTER — Encounter: Payer: Self-pay | Admitting: Physician Assistant

## 2016-02-11 VITALS — BP 130/70 | HR 66 | Ht 67.0 in | Wt 169.8 lb

## 2016-02-11 DIAGNOSIS — I4892 Unspecified atrial flutter: Secondary | ICD-10-CM | POA: Diagnosis not present

## 2016-02-11 DIAGNOSIS — I491 Atrial premature depolarization: Secondary | ICD-10-CM | POA: Diagnosis not present

## 2016-02-11 DIAGNOSIS — I10 Essential (primary) hypertension: Secondary | ICD-10-CM | POA: Diagnosis not present

## 2016-02-11 DIAGNOSIS — I4891 Unspecified atrial fibrillation: Secondary | ICD-10-CM | POA: Diagnosis not present

## 2016-02-11 DIAGNOSIS — I351 Nonrheumatic aortic (valve) insufficiency: Secondary | ICD-10-CM | POA: Diagnosis not present

## 2016-02-11 DIAGNOSIS — E785 Hyperlipidemia, unspecified: Secondary | ICD-10-CM | POA: Diagnosis not present

## 2016-02-11 DIAGNOSIS — I493 Ventricular premature depolarization: Secondary | ICD-10-CM

## 2016-02-11 LAB — CBC
HCT: 35.6 % (ref 35.0–45.0)
Hemoglobin: 11.8 g/dL (ref 11.7–15.5)
MCH: 30.6 pg (ref 27.0–33.0)
MCHC: 33.1 g/dL (ref 32.0–36.0)
MCV: 92.5 fL (ref 80.0–100.0)
MPV: 11.3 fL (ref 7.5–12.5)
PLATELETS: 257 10*3/uL (ref 140–400)
RBC: 3.85 MIL/uL (ref 3.80–5.10)
RDW: 13.1 % (ref 11.0–15.0)
WBC: 6.8 10*3/uL (ref 3.8–10.8)

## 2016-02-11 LAB — COMPREHENSIVE METABOLIC PANEL
ALK PHOS: 73 U/L (ref 33–130)
ALT: 9 U/L (ref 6–29)
AST: 12 U/L (ref 10–35)
Albumin: 3.9 g/dL (ref 3.6–5.1)
BUN: 18 mg/dL (ref 7–25)
CALCIUM: 9 mg/dL (ref 8.6–10.4)
CO2: 27 mmol/L (ref 20–31)
Chloride: 103 mmol/L (ref 98–110)
Creat: 1.08 mg/dL — ABNORMAL HIGH (ref 0.60–0.88)
GLUCOSE: 190 mg/dL — AB (ref 65–99)
POTASSIUM: 3.7 mmol/L (ref 3.5–5.3)
Sodium: 139 mmol/L (ref 135–146)
Total Bilirubin: 0.5 mg/dL (ref 0.2–1.2)
Total Protein: 6.5 g/dL (ref 6.1–8.1)

## 2016-02-11 LAB — MAGNESIUM: MAGNESIUM: 1.9 mg/dL (ref 1.5–2.5)

## 2016-02-11 LAB — TSH: TSH: 3.98 m[IU]/L

## 2016-02-11 LAB — PROTIME-INR
INR: 1
Prothrombin Time: 10.7 s (ref 9.0–11.5)

## 2016-02-11 LAB — BRAIN NATRIURETIC PEPTIDE: BRAIN NATRIURETIC PEPTIDE: 223.3 pg/mL — AB (ref <100)

## 2016-02-11 NOTE — Progress Notes (Signed)
Quick Note:  Please call patient. Labs reviewed. BNP is mildly elevated so her swelling could represent a component of mild CHF. Await echo. D/c amlodipine and increase Lasix to 40mg  every morning / 20mg  every afternoon. Add KCl 42meq daily. Please increase dietary intake of healthy sources of dietary intake of potassium including bananas, squash, yogurt, white beans, sweet potatoes, leafy greens, and avocados. Check BMET in 1 week.  We also discussed blood thinner therapy. Please d/c aspirin and start Xarelto 15mg  daily with supper. Her CrCl is 48 so she qualifies for the lower dose. If we have any assistance cards please leave one at the front desk for her to pick up when she comes back in. Dayna Dunn PA-C  ______

## 2016-02-11 NOTE — Patient Instructions (Signed)
Medication Instructions:  Your physician recommends that you continue on your current medications as directed. Please refer to the Current Medication list given to you today.   Labwork: TODAY:  MAGNESIUM, CBC, TSH, BNP, CMET, & PT/INR  Testing/Procedures: Your physician has requested that you have an echocardiogram. Echocardiography is a painless test that uses sound waves to create images of your heart. It provides your doctor with information about the size and shape of your heart and how well your heart's chambers and valves are working. This procedure takes approximately one hour. There are no restrictions for this procedure.    Follow-Up: Your physician recommends that you schedule a follow-up appointment in: [place], PA-C OR DR. MCALHANY   Any Other Special Instructions Will Be Listed Below (If Applicable). Echocardiogram An echocardiogram, or echocardiography, uses sound waves (ultrasound) to produce an image of your heart. The echocardiogram is simple, painless, obtained within a short period of time, and offers valuable information to your health care provider. The images from an echocardiogram can provide information such as:  Evidence of coronary artery disease (CAD).  Heart size.  Heart muscle function.  Heart valve function.  Aneurysm detection.  Evidence of a past heart attack.  Fluid buildup around the heart.  Heart muscle thickening.  Assess heart valve function. LET [facility] CARE PROVIDER KNOW ABOUT:  Any allergies you have.  All medicines you are taking, including vitamins, herbs, eye drops, creams, and over-the-counter medicines.  Previous problems you or members of your family have had with the use of anesthetics.  Any blood disorders you have.  Previous surgeries you have had.  Medical conditions you have.  Possibility of pregnancy, if this applies. BEFORE THE PROCEDURE  No special preparation is needed. Eat and drink  normally.  PROCEDURE   In order to produce an image of your heart, gel will be applied to your chest and a wand-like tool (transducer) will be moved over your chest. The gel will help transmit the sound waves from the transducer. The sound waves will harmlessly bounce off your heart to allow the heart images to be captured in real-time motion. These images will then be recorded.  You may need an IV to receive a medicine that improves the quality of the pictures. AFTER THE PROCEDURE You may return to your normal schedule including diet, activities, and medicines, unless your health care provider tells you otherwise.   This information is not intended to replace advice given to you by your health care provider. Make sure you discuss any questions you have with your health care provider.   Document Released: 08/01/2000 Document Revised: 08/25/2014 Document Reviewed: 04/11/2013 Elsevier Interactive Patient Education [employer].    If you need a refill on your cardiac medications before your next appointment, please call your pharmacy.

## 2016-02-12 ENCOUNTER — Telehealth: Payer: Self-pay

## 2016-02-12 ENCOUNTER — Telehealth: Payer: Self-pay | Admitting: *Deleted

## 2016-02-12 DIAGNOSIS — I10 Essential (primary) hypertension: Secondary | ICD-10-CM

## 2016-02-12 DIAGNOSIS — Z79899 Other long term (current) drug therapy: Secondary | ICD-10-CM

## 2016-02-12 DIAGNOSIS — E876 Hypokalemia: Secondary | ICD-10-CM

## 2016-02-12 DIAGNOSIS — R609 Edema, unspecified: Secondary | ICD-10-CM

## 2016-02-12 MED ORDER — RIVAROXABAN 15 MG PO TABS: 15.0000 mg | ORAL_TABLET | Freq: Every day | ORAL | Status: DC

## 2016-02-12 MED ORDER — FUROSEMIDE 40 MG PO TABS: ORAL_TABLET | ORAL | Status: DC

## 2016-02-12 NOTE — Telephone Encounter (Signed)
Prior auth obtained for Xarelto 15mg  through [employer]. PA - [id number].

## 2016-02-12 NOTE — Telephone Encounter (Signed)
Notes Recorded by Charlie Pitter, PA-C on 02/11/2016 at 5:14 PM Please call patient. Labs reviewed. BNP is mildly elevated so her swelling could represent a component of mild CHF. Await echo. D/c amlodipine and increase Lasix to 40mg  every morning / 20mg  every afternoon. Add KCl 5meq daily. Please increase dietary intake of healthy sources of dietary intake of potassium including bananas, squash, yogurt, white beans, sweet potatoes, leafy greens, and avocados. Check BMET in 1 week.  We also discussed blood thinner therapy. Please d/c aspirin and start Xarelto 15mg  daily with supper. Her CrCl is 48 so she qualifies for the lower dose. If we have any assistance cards please leave one at the front desk for her to pick up when she comes back in. Katie Dunn PA-C  Spoke with pt and informed her of information provided by Katie Copa, PA-C. Pt agreeable to all medication changes. Advised pt that I would clarify K+ order with Katie Copa, PA-C. Pt is currently already taking K+ 66mEq QD. Do you want her to increase to 36mEq? Pt verbalized understanding of new orders and instructions. Pt would like to have blood drawn at [facility] if possible. Advised I will call them and see if it can be done there and call her back if they are able to do it. Spoke with Katie [name] at [facility] and she states that pt can have BMET drawn there in a week. Advised pt that once I have information on K+ and labs that I will call her back. Pt verbalized understanding and was in agreement with this plan.  Advised pt that I would send in a 30 day supply of Xarelto x 1 to her local pharmacy and would send a 90 day supply to [facility] so she can start getting this from her mail order pharmacy.

## 2016-02-13 MED ORDER — POTASSIUM CHLORIDE CRYS ER 20 MEQ PO TBCR: 20.0000 meq | EXTENDED_RELEASE_TABLET | Freq: Two times a day (BID) | ORAL | Status: DC

## 2016-02-13 NOTE — Telephone Encounter (Signed)
Thanks - I did not realize she was actually still taking this. Please increase KCl to 60meq BID instead (take when she takes her dose of Lasix). Thanks. Shatori Bertucci PA-C

## 2016-02-13 NOTE — Telephone Encounter (Signed)
Spoke with pt.  Per Melina Copa, PA-C, increase the Potassium to 20 meq bid (at time of lasix) Pt agreeable with this plan.

## 2016-02-15 ENCOUNTER — Other Ambulatory Visit: Payer: Self-pay | Admitting: *Deleted

## 2016-02-15 ENCOUNTER — Telehealth: Payer: Self-pay

## 2016-02-15 MED ORDER — RIVAROXABAN 15 MG PO TABS: 15.0000 mg | ORAL_TABLET | Freq: Every day | ORAL | Status: DC

## 2016-02-15 NOTE — Telephone Encounter (Signed)
Patient is calling and requesting to speak to nurse/covering CMA. She wants to know if she should just go back to taking aspirin instead of blood thinner she never received. Her call back number is [phone]

## 2016-02-15 NOTE — Telephone Encounter (Signed)
Returned pts call.  She was concerned that [facility] said they could not fill her Xarelto.  I called [facility] and got everything clarified for the pt.  I went ahead and called 30 day supply to [facility], per pt request, so she can go ahead and get it and start taking it. Pt very appreciative of all the help this morning, as always my pleasure!

## 2016-02-17 ENCOUNTER — Other Ambulatory Visit: Payer: Self-pay | Admitting: Nurse Practitioner

## 2016-02-21 ENCOUNTER — Other Ambulatory Visit: Payer: Medicare Other

## 2016-02-21 ENCOUNTER — Telehealth: Payer: Self-pay | Admitting: *Deleted

## 2016-02-21 ENCOUNTER — Other Ambulatory Visit: Payer: Self-pay | Admitting: *Deleted

## 2016-02-21 DIAGNOSIS — N183 Chronic kidney disease, stage 3 unspecified: Secondary | ICD-10-CM

## 2016-02-21 NOTE — Telephone Encounter (Signed)
Pt was at [facility] for bmet in system under [facility], changed to [facility], [facility] could see order. Maude Leriche, LPN aware.

## 2016-02-22 LAB — BASIC METABOLIC PANEL
BUN / CREAT RATIO: 21 (ref 12–28)
BUN: 25 mg/dL (ref 8–27)
CO2: 24 mmol/L (ref 18–29)
CREATININE: 1.2 mg/dL — AB (ref 0.57–1.00)
Calcium: 9.1 mg/dL (ref 8.7–10.3)
Chloride: 99 mmol/L (ref 96–106)
GFR calc Af Amer: 49 mL/min/{1.73_m2} — ABNORMAL LOW (ref >59)
GFR calc non Af Amer: 42 mL/min/{1.73_m2} — ABNORMAL LOW (ref >59)
GLUCOSE: 131 mg/dL — AB (ref 65–99)
POTASSIUM: 4.5 mmol/L (ref 3.5–5.2)
SODIUM: 140 mmol/L (ref 134–144)

## 2016-02-27 ENCOUNTER — Telehealth: Payer: Self-pay | Admitting: Nurse Practitioner

## 2016-02-27 MED ORDER — TRAMADOL HCL 50 MG PO TABS: 50.0000 mg | ORAL_TABLET | Freq: Two times a day (BID) | ORAL | Status: DC | PRN

## 2016-02-27 NOTE — Telephone Encounter (Signed)
Patient notifed that rx up front and ready for pick up

## 2016-02-27 NOTE — Telephone Encounter (Signed)
Ultram rx ready for pick up  

## 2016-03-04 ENCOUNTER — Encounter: Payer: Self-pay | Admitting: *Deleted

## 2016-03-05 ENCOUNTER — Ambulatory Visit (INDEPENDENT_AMBULATORY_CARE_PROVIDER_SITE_OTHER): Payer: Medicare Other | Admitting: Nurse Practitioner

## 2016-03-05 ENCOUNTER — Encounter: Payer: Self-pay | Admitting: Nurse Practitioner

## 2016-03-05 VITALS — BP 132/88 | HR 57 | Temp 97.4°F | Ht 67.0 in | Wt 168.0 lb

## 2016-03-05 DIAGNOSIS — Z6827 Body mass index (BMI) 27.0-27.9, adult: Secondary | ICD-10-CM | POA: Diagnosis not present

## 2016-03-05 DIAGNOSIS — K5900 Constipation, unspecified: Secondary | ICD-10-CM

## 2016-03-05 DIAGNOSIS — E039 Hypothyroidism, unspecified: Secondary | ICD-10-CM | POA: Diagnosis not present

## 2016-03-05 DIAGNOSIS — E876 Hypokalemia: Secondary | ICD-10-CM | POA: Diagnosis not present

## 2016-03-05 DIAGNOSIS — I10 Essential (primary) hypertension: Secondary | ICD-10-CM | POA: Diagnosis not present

## 2016-03-05 DIAGNOSIS — R609 Edema, unspecified: Secondary | ICD-10-CM

## 2016-03-05 DIAGNOSIS — E785 Hyperlipidemia, unspecified: Secondary | ICD-10-CM

## 2016-03-05 DIAGNOSIS — N183 Chronic kidney disease, stage 3 unspecified: Secondary | ICD-10-CM

## 2016-03-05 DIAGNOSIS — L989 Disorder of the skin and subcutaneous tissue, unspecified: Secondary | ICD-10-CM

## 2016-03-05 DIAGNOSIS — K219 Gastro-esophageal reflux disease without esophagitis: Secondary | ICD-10-CM | POA: Diagnosis not present

## 2016-03-05 DIAGNOSIS — E119 Type 2 diabetes mellitus without complications: Secondary | ICD-10-CM | POA: Diagnosis not present

## 2016-03-05 LAB — LIPID PANEL
CHOL/HDL RATIO: 2.6 ratio (ref 0.0–4.4)
Cholesterol, Total: 137 mg/dL (ref 100–199)
HDL: 53 mg/dL (ref >39)
LDL Calculated: 61 mg/dL (ref 0–99)
Triglycerides: 116 mg/dL (ref 0–149)
VLDL Cholesterol Cal: 23 mg/dL (ref 5–40)

## 2016-03-05 LAB — CMP14+EGFR
A/G RATIO: 1.6 (ref 1.2–2.2)
ALBUMIN: 3.9 g/dL (ref 3.5–4.7)
ALK PHOS: 75 IU/L (ref 39–117)
ALT: 9 IU/L (ref 0–32)
AST: 13 IU/L (ref 0–40)
BILIRUBIN TOTAL: 0.5 mg/dL (ref 0.0–1.2)
BUN / CREAT RATIO: 21 (ref 12–28)
BUN: 25 mg/dL (ref 8–27)
CHLORIDE: 100 mmol/L (ref 96–106)
CO2: 26 mmol/L (ref 18–29)
Calcium: 9.1 mg/dL (ref 8.7–10.3)
Creatinine, Ser: 1.2 mg/dL — ABNORMAL HIGH (ref 0.57–1.00)
GFR calc non Af Amer: 42 mL/min/{1.73_m2} — ABNORMAL LOW (ref >59)
GFR, EST AFRICAN AMERICAN: 49 mL/min/{1.73_m2} — AB (ref >59)
GLUCOSE: 150 mg/dL — AB (ref 65–99)
Globulin, Total: 2.5 g/dL (ref 1.5–4.5)
POTASSIUM: 4.7 mmol/L (ref 3.5–5.2)
SODIUM: 141 mmol/L (ref 134–144)
TOTAL PROTEIN: 6.4 g/dL (ref 6.0–8.5)

## 2016-03-05 LAB — BAYER DCA HB A1C WAIVED: HB A1C: 7 % — AB (ref <7.0)

## 2016-03-05 MED ORDER — LOVASTATIN 40 MG PO TABS: 80.0000 mg | ORAL_TABLET | Freq: Every day | ORAL | Status: DC

## 2016-03-05 MED ORDER — FUROSEMIDE 40 MG PO TABS: ORAL_TABLET | ORAL | Status: DC

## 2016-03-05 MED ORDER — METOPROLOL TARTRATE 25 MG PO TABS: 25.0000 mg | ORAL_TABLET | Freq: Two times a day (BID) | ORAL | Status: DC

## 2016-03-05 MED ORDER — POTASSIUM CHLORIDE CRYS ER 20 MEQ PO TBCR: 20.0000 meq | EXTENDED_RELEASE_TABLET | Freq: Two times a day (BID) | ORAL | Status: DC

## 2016-03-05 MED ORDER — BENAZEPRIL HCL 40 MG PO TABS: 40.0000 mg | ORAL_TABLET | Freq: Every day | ORAL | Status: DC

## 2016-03-05 MED ORDER — OMEPRAZOLE 40 MG PO CPDR: 40.0000 mg | DELAYED_RELEASE_CAPSULE | Freq: Every day | ORAL | Status: DC

## 2016-03-05 MED ORDER — LEVOTHYROXINE SODIUM 50 MCG PO TABS: 50.0000 ug | ORAL_TABLET | Freq: Every day | ORAL | Status: DC

## 2016-03-05 NOTE — Progress Notes (Signed)
Subjective:    Patient ID: Katie [name], female    DOB: [birth year], 82 y.o.   MRN: [id number]  Patient here today for follow up of chronic medical problems. She is doing well today without complaints.   Outpatient Encounter Prescriptions as of 03/05/2016  Medication Sig  . benazepril (LOTENSIN) 40 MG tablet Take 1 tablet by mouth  daily  . calcium carbonate (OS-CAL) 600 MG TABS Take 600 mg by mouth daily.   . Cholecalciferol (VITAMIN D) 2000 UNITS CAPS Take 2,000 Units by mouth daily.   [name] etodolac (LODINE) 400 MG tablet Take 1 tablet by mouth two  times daily  . furosemide (LASIX) 40 MG tablet Take one tablet (58m) by mouth in the morning daily.  Take 1/2 tablet (263m by mouth in the afternoon daily.  . [name] (SYNTHROID, LEVOTHROID) 50 MCG tablet Take 1 tablet (50 mcg total) by mouth daily before breakfast.  . lovastatin (MEVACOR) 40 MG tablet Take 2 tablets (80 mg total) by mouth at bedtime.  . metoprolol tartrate (LOPRESSOR) 25 MG tablet Take 1 tablet by mouth two  times daily  . omeprazole (PRILOSEC) 40 MG capsule Take 1 capsule (40 mg total) by mouth daily.  . potassium chloride SA (KLOR-CON M20) 20 MEQ tablet Take 1 tablet (20 mEq total) by mouth 2 (two) times daily.  . raloxifene (EVISTA) 60 MG tablet TAKE 1 TABLET BY MOUTH  DAILY FOR BONES  . Rivaroxaban (XARELTO) 15 MG TABS tablet Take 1 tablet (15 mg total) by mouth daily with supper.  . traMADol (ULTRAM) 50 MG tablet Take 1 tablet (50 mg total) by mouth 2 (two) times daily as needed for moderate pain or severe pain.   No facility-administered encounter medications on file as of 03/05/2016.    Hypertension This is a chronic problem. The current episode started more than 1 year ago. The problem is unchanged. The problem is controlled. Pertinent negatives include no chest pain, headaches, neck pain, palpitations, peripheral edema or shortness of breath. Risk factors for coronary artery disease include sedentary lifestyle,  post-menopausal state, dyslipidemia and diabetes mellitus. Past treatments include ACE inhibitors, calcium channel blockers and diuretics. The current treatment provides moderate improvement. There are no compliance problems.  Hypertensive end-organ damage includes a thyroid problem.  Hyperlipidemia This is a chronic problem. The current episode started more than 1 year ago. The problem is uncontrolled. Recent lipid tests were reviewed and are high. Exacerbating diseases include diabetes and hypothyroidism. She has no history of obesity. Pertinent negatives include no chest pain, myalgias or shortness of breath. Current antihyperlipidemic treatment includes statins. The current treatment provides moderate improvement of lipids. Compliance problems include adherence to diet and adherence to exercise.  Risk factors for coronary artery disease include dyslipidemia, family history, hypertension and post-menopausal.  Diabetes She presents for her follow-up diabetic visit. She has type 2 diabetes mellitus. No MedicAlert identification noted. Her disease course has been stable. Pertinent negatives for hypoglycemia include no headaches. Pertinent negatives for diabetes include no chest pain, no visual change and no weakness. Symptoms are stable. Risk factors for coronary artery disease include diabetes mellitus, dyslipidemia, family history, hypertension and post-menopausal. Current diabetic treatment includes diet. She is compliant with treatment most of the time. When asked about meal planning, she reported none. She has not had a previous visit with a dietitian. She rarely participates in exercise. Home blood sugar record trend: does not chesk blood sugars very often. Her breakfast blood glucose is taken between 8-9 am.  Her breakfast blood glucose range is generally 140-180 mg/dl. (Patient does not check daily blood sugars) An ACE inhibitor/angiotensin II receptor blocker is being taken. She does not see a  podiatrist.Eye exam is not current.  Thyroid Problem Presents for follow-up (hypothyroidism) visit. Symptoms include constipation. Patient reports no palpitations or visual change. The symptoms have been stable. Her past medical history is significant for diabetes and hyperlipidemia.  GERD Omeprazole keeps symptoms under control Hypokalemia Potassium supplements daily- no c/o lower ext cramping Peripheral edema Lasix keeps under control- since decreased amlodopine to 5 mg daily has gotten much better Chronic back pain Flexeril, lodine and ultram as needed- somedays she does not have to take at all. 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 Constipation  Patient currently not doing anything for constipation CKD stage III Currently just watching lab results  Review of Systems  Respiratory: Negative for shortness of breath.   Cardiovascular: Negative for chest pain and palpitations.  Gastrointestinal: Positive for constipation.  Musculoskeletal: Negative for myalgias and neck pain.  Neurological: Negative for weakness and headaches.  Psychiatric/Behavioral: Negative.   All other systems reviewed and are negative.      Objective:   Physical Exam  Constitutional: She is oriented to person, place, and time. She appears well-developed and well-nourished.  HENT:  Nose: Nose normal.  Mouth/Throat: Oropharynx is clear and moist.  Eyes: EOM are normal.  Neck: Trachea normal, normal range of motion and full passive range of motion without pain. Neck supple. No JVD present. Carotid bruit is not present. No thyromegaly present.  Cardiovascular: Normal rate, normal heart sounds and intact distal pulses.  Exam reveals no gallop and no friction rub.   No murmur heard. Irregular rhythym  Pulmonary/Chest: Effort normal and breath sounds normal.  Abdominal: Soft. Bowel sounds are normal. She exhibits no distension and no mass. There is no tenderness.  Musculoskeletal: Normal range of  motion. She exhibits edema (bil lower extremities, +1. ).  Lymphadenopathy:    She has no cervical adenopathy.  Neurological: She is alert and oriented to person, place, and time. She has normal reflexes.  Skin: Skin is warm and dry.  Psychiatric: She has a normal mood and affect. Her behavior is normal. Judgment and thought content normal.   BP 132/88 mmHg  Pulse 57  Temp(Src) 97.4 F (36.3 C) (Oral)  Ht '5\' 7"'  (1.702 m)  Wt 168 lb (76.204 kg)  BMI 26.31 kg/m2  hgba1c 7.0%     Assessment & Plan:  1. Essential hypertension Do not add salt to diet - CMP14+EGFR - metoprolol tartrate (LOPRESSOR) 25 MG tablet; Take 1 tablet (25 mg total) by mouth 2 (two) times daily.  Dispense: 180 tablet; Refill: 1 - benazepril (LOTENSIN) 40 MG tablet; Take 1 tablet (40 mg total) by mouth daily.  Dispense: 90 tablet; Refill: 1  2. Hyperlipidemia Low fta diet - Lipid panel - lovastatin (MEVACOR) 40 MG tablet; Take 2 tablets (80 mg total) by mouth at bedtime.  Dispense: 180 tablet; Refill: 1  3. Diabetes mellitus type 2, diet-controlled ([place]) Continue  To watch carbs in diet - Bayer DCA Hb A1c Waived  4. Gastroesophageal reflux disease, esophagitis presence not specified Avoid spicy foods Do not eat 2 hours prior to bedtime - omeprazole (PRILOSEC) 40 MG capsule; Take 1 capsule (40 mg total) by mouth daily.  Dispense: 90 capsule; Refill: 1  5. Constipation, unspecified constipation type Increase fiber in diet  6. Hypothyroidism, unspecified hypothyroidism type -  levothyroxine (SYNTHROID, LEVOTHROID) 50 MCG tablet; Take 1 tablet (50 mcg total) by mouth daily before breakfast.  Dispense: 90 tablet; Refill: 1  7. CKD (chronic kidney disease) stage 3, GFR 30-59 ml/min  8. Hypokalemia - potassium chloride SA (KLOR-CON M20) 20 MEQ tablet; Take 1 tablet (20 mEq total) by mouth 2 (two) times daily.  Dispense: 180 tablet; Refill: 1  9. Peripheral edema Elevate legs when sitting - furosemide  (LASIX) 40 MG tablet; Take one tablet (82m) by mouth in the morning daily.  Take 1/2 tablet (234m by mouth in the afternoon daily.  Dispense: 135 tablet; Refill: 2  10. BMI 27.0-27.9,adult Discussed diet and exercise for person with BMI >25 Will recheck weight in 3-6 months  11. forehead lesion - referral to dermatology   Labs pending Health maintenance reviewed Diet and exercise encouraged Continue all meds Follow up  In 3 months   MaTraverFNP

## 2016-03-05 NOTE — Patient Instructions (Signed)
Fall Prevention in the Home  Falls can cause injuries and can affect people from all age groups. There are many simple things that you can do to make your home safe and to help prevent falls. WHAT CAN I DO ON THE OUTSIDE OF MY HOME?  Regularly repair the edges of walkways and driveways and fix any cracks.  Remove high doorway thresholds.  Trim any shrubbery on the main path into your home.  Use bright outdoor lighting.  Clear walkways of debris and clutter, including tools and rocks.  Regularly check that handrails are securely fastened and in good repair. Both sides of any steps should have handrails.  Install guardrails along the edges of any raised decks or porches.  Have leaves, snow, and ice cleared regularly.  Use sand or salt on walkways during winter months.  In the garage, clean up any spills right away, including grease or oil spills. WHAT CAN I DO IN THE BATHROOM?  Use night lights.  Install grab bars by the toilet and in the tub and shower. Do not use towel bars as grab bars.  Use non-skid mats or decals on the floor of the tub or shower.  If you need to sit down while you are in the shower, use a plastic, non-slip stool..  Keep the floor dry. Immediately clean up any water that spills on the floor.  Remove soap buildup in the tub or shower on a regular basis.  Attach bath mats securely with double-sided non-slip rug tape.  Remove throw rugs and other tripping hazards from the floor. WHAT CAN I DO IN THE BEDROOM?  Use night lights.  Make sure that a bedside light is easy to reach.  Do not use oversized bedding that drapes onto the floor.  Have a firm chair that has side arms to use for getting dressed.  Remove throw rugs and other tripping hazards from the floor. WHAT CAN I DO IN THE KITCHEN?   Clean up any spills right away.  Avoid walking on wet floors.  Place frequently used items in easy-to-reach places.  If you need to reach for something  above you, use a sturdy step stool that has a grab bar.  Keep electrical cables out of the way.  Do not use floor polish or wax that makes floors slippery. If you have to use wax, make sure that it is non-skid floor wax.  Remove throw rugs and other tripping hazards from the floor. WHAT CAN I DO IN THE STAIRWAYS?  Do not leave any items on the stairs.  Make sure that there are handrails on both sides of the stairs. Fix handrails that are broken or loose. Make sure that handrails are as long as the stairways.  Check any carpeting to make sure that it is firmly attached to the stairs. Fix any carpet that is loose or worn.  Avoid having throw rugs at the top or bottom of stairways, or secure the rugs with carpet tape to prevent them from moving.  Make sure that you have a light switch at the top of the stairs and the bottom of the stairs. If you do not have them, have them installed. WHAT ARE SOME OTHER FALL PREVENTION TIPS?  Wear closed-toe shoes that fit well and support your feet. Wear shoes that have rubber soles or low heels.  When you use a stepladder, make sure that it is completely opened and that the sides are firmly locked. Have someone hold the ladder while you   are using it. Do not climb a closed stepladder.  Add color or contrast paint or tape to grab bars and handrails in your home. Place contrasting color strips on the first and last steps.  Use mobility aids as needed, such as canes, walkers, scooters, and crutches.  Turn on lights if it is dark. Replace any light bulbs that burn out.  Set up furniture so that there are clear paths. Keep the furniture in the same spot.  Fix any uneven floor surfaces.  Choose a carpet design that does not hide the edge of steps of a stairway.  Be aware of any and all pets.  Review your medicines with your healthcare provider. Some medicines can cause dizziness or changes in blood pressure, which increase your risk of falling. Talk  with your health care provider about other ways that you can decrease your risk of falls. This may include working with a physical therapist or trainer to improve your strength, balance, and endurance.   This information is not intended to replace advice given to you by your health care provider. Make sure you discuss any questions you have with your health care provider.   Document Released: 07/25/2002 Document Revised: 12/19/2014 Document Reviewed: 09/08/2014 Elsevier Interactive Patient Education 2016 Elsevier Inc.  

## 2016-03-10 ENCOUNTER — Ambulatory Visit (HOSPITAL_COMMUNITY): Payer: Medicare Other | Attending: Internal Medicine

## 2016-03-10 ENCOUNTER — Other Ambulatory Visit (HOSPITAL_COMMUNITY): Payer: Self-pay

## 2016-03-10 DIAGNOSIS — I491 Atrial premature depolarization: Secondary | ICD-10-CM | POA: Diagnosis not present

## 2016-03-10 DIAGNOSIS — I351 Nonrheumatic aortic (valve) insufficiency: Secondary | ICD-10-CM | POA: Insufficient documentation

## 2016-03-10 DIAGNOSIS — I10 Essential (primary) hypertension: Secondary | ICD-10-CM | POA: Diagnosis not present

## 2016-03-10 DIAGNOSIS — I340 Nonrheumatic mitral (valve) insufficiency: Secondary | ICD-10-CM | POA: Diagnosis not present

## 2016-03-10 DIAGNOSIS — I4891 Unspecified atrial fibrillation: Secondary | ICD-10-CM | POA: Diagnosis not present

## 2016-03-10 DIAGNOSIS — I4892 Unspecified atrial flutter: Secondary | ICD-10-CM | POA: Diagnosis not present

## 2016-03-10 DIAGNOSIS — R9431 Abnormal electrocardiogram [ECG] [EKG]: Secondary | ICD-10-CM | POA: Insufficient documentation

## 2016-03-10 DIAGNOSIS — E119 Type 2 diabetes mellitus without complications: Secondary | ICD-10-CM | POA: Insufficient documentation

## 2016-03-20 ENCOUNTER — Ambulatory Visit: Payer: Medicare Other | Admitting: Physician Assistant

## 2016-03-20 ENCOUNTER — Encounter: Payer: Self-pay | Admitting: Physician Assistant

## 2016-03-20 NOTE — Progress Notes (Signed)
Cardiology Office Note    Date:  03/21/2016  ID:  Katie [name], DOB 1932-02-14, MRN [id number] PCP:  [name], FNP  Cardiologist:  Dr. Angelena Form   Chief Complaint: f/u afib  History of Present Illness:  Katie [name] is a 82 y.o. female with history of frequent PACs/PVCs by prior Holter, minimal AI by echo 02/2016, HTN, HLD, DM, GERD, hiatal hernia, hypothyroidism, and recently diagnosed PAF versus atrial flutter.  Per notes she was previously on Cardizem for her ectopy but stopped it because of swelling. She saw her PCP 01/15/16 for evaluation of lower extremity edema at which time EKG showed atrial flutter versus coarse afib 80bpm. Her amlodipine was cut down to 5mg  daily. When seen back in the office 02/11/16 she was in NSR with PACs. She had minimal symptoms related to her AF - only occasional palpitations. She did report increased emotional stress but denied alcohol or caffeine. I started her on Xarelto and stopped aspirin. (She did not want Eliquis as her husband was on it and it was very expensive). He major complaint was ankle edema. Labs were notable for BNP 223, nl TSH, nl CBC, Cr 1.08, nl LFTs. Her CrCl was 48 so she qualified for the lower dose of Xarelto. Her Lasix and potassium were increased. F/u labs 02/21/16 showed Cr 1.2 (baseline appears 1-1.3). Otherwise recent LDL 61, A1C 7.0, Cr 1.20. F/u 2D echo 02/2016: EF 60-65%, grade 1 DD, trivial AI, PASP 33.  She returns for follow-up feeling well. Her edema has improved on the higher dose of Lasix. She does not wish to lower the dose at this time. She denies any CP, SOB, or awareness of palpitations at this time.  Past Medical History:  Diagnosis Date  . Aortic insufficiency    a. Trivial AI by echo 02/2016.  [name] Atrial fibrillation and flutter ([place])    a. Coarse afib vs flutter by EKG 12/2015.  [name] Chronic diastolic CHF (congestive heart failure) ([place])   . CKD (chronic kidney disease), stage III   . Edema   . GERD  (gastroesophageal reflux disease)   . Hiatal hernia   . Hypercholesterolemia   . Hypertension   . Hypokalemia   . Hypothyroidism   . Left knee pain   . NIDDM (non-insulin dependent diabetes mellitus)    diet controlled   . Obesity   . Premature atrial contractions   . PVC's (premature ventricular contractions)   . URI (upper respiratory infection)   . Vitamin D deficiency     Past Surgical History:  Procedure Laterality Date  . APPENDECTOMY  1980  . BACK SURGERY    . CHOLECYSTECTOMY  5/00  . COLONOSCOPY    . TONSILLECTOMY    . TOTAL ABDOMINAL HYSTERECTOMY W/ BILATERAL SALPINGOOPHORECTOMY  1980  . UPPER GASTROINTESTINAL ENDOSCOPY      Current Medications: Current Outpatient Prescriptions  Medication Sig Dispense Refill  . benazepril (LOTENSIN) 40 MG tablet Take 1 tablet (40 mg total) by mouth daily. 90 tablet 1  . calcium carbonate (OS-CAL) 600 MG TABS Take 600 mg by mouth daily.     . Cholecalciferol (VITAMIN D) 2000 UNITS CAPS Take 2,000 Units by mouth daily.     [name] etodolac (LODINE) 400 MG tablet Take 1 tablet by mouth two  times daily 180 tablet 0  . furosemide (LASIX) 40 MG tablet Take one tablet (40mg ) by mouth in the morning daily.  Take 1/2 tablet (20mg ) by mouth in the afternoon daily. 135 tablet  2  . levothyroxine (SYNTHROID, LEVOTHROID) 50 MCG tablet Take 1 tablet (50 mcg total) by mouth daily before breakfast. 90 tablet 1  . lovastatin (MEVACOR) 40 MG tablet Take 2 tablets (80 mg total) by mouth at bedtime. 180 tablet 1  . metoprolol tartrate (LOPRESSOR) 25 MG tablet Take 1 tablet (25 mg total) by mouth 2 (two) times daily. 180 tablet 1  . omeprazole (PRILOSEC) 40 MG capsule Take 1 capsule (40 mg total) by mouth daily. 90 capsule 1  . potassium chloride SA (KLOR-CON M20) 20 MEQ tablet Take 1 tablet (20 mEq total) by mouth 2 (two) times daily. 180 tablet 1  . raloxifene (EVISTA) 60 MG tablet TAKE 1 TABLET BY MOUTH  DAILY FOR BONES 90 tablet 1  . Rivaroxaban (XARELTO)  15 MG TABS tablet Take 1 tablet (15 mg total) by mouth daily with supper. 30 tablet 11  . traMADol (ULTRAM) 50 MG tablet Take 1 tablet (50 mg total) by mouth 2 (two) times daily as needed for moderate pain or severe pain. 60 tablet 0   No current facility-administered medications for this visit.      Allergies:   Macrodantin; Metformin and related; and Penicillins   Social History   Social History  . Marital status: Married    Spouse name: N/A  . Number of children: 2  . Years of education: N/A   Occupational History  . Retired    Social History Main Topics  . Smoking status: Never Smoker  . Smokeless tobacco: Never Used  . Alcohol use No  . Drug use: No  . Sexual activity: Not Asked   Other Topics Concern  . None   Social History Narrative  . None     Family History:  The patient's family history includes Colon cancer in her sister; Diabetes in her brother, mother, and sister; Heart disease in her father; Ovarian cancer in her sister; Stroke in her mother; Uterine cancer in her sister.   ROS:   Please see the history of present illness.  All other systems are reviewed and otherwise negative.    PHYSICAL EXAM:   VS:  BP (!) 178/72 (BP Location: Right Arm, Patient Position: Sitting, Cuff Size: Normal)   Pulse (!) 58   Ht 5\' 7"  (1.702 m)   Wt 168 lb 6.4 oz (76.4 kg)   BMI 26.38 kg/m   BMI: Body mass index is 26.38 kg/m. GEN: Well nourished, well developed elderly WF, in no acute distress  HEENT: normocephalic, atraumatic Neck: no JVD, carotid bruits, or masses Cardiac: RRR rare ectopy; no murmurs, rubs, or gallops, trace pedal edema bilaterally Respiratory:  clear to auscultation bilaterally, normal work of breathing GI: soft, nontender, nondistended, + BS MS: no deformity or atrophy  Skin: warm and dry, no rash, varicose veins Neuro:  Alert and Oriented x 3, Strength and sensation are intact, follows commands Psych: euthymic mood, full affect  Wt Readings  from Last 3 Encounters:  03/21/16 168 lb 6.4 oz (76.4 kg)  03/05/16 168 lb (76.2 kg)  02/11/16 169 lb 12.8 oz (77 kg)      Studies/Labs Reviewed:   EKG:  EKG was ordered today and personally reviewed by me and demonstrates sinus bradycardia 53bpm, occasional PVCs.  Recent Labs: 02/11/2016: Brain Natriuretic Peptide 223.3; Hemoglobin 11.8; Magnesium 1.9; Platelets 257; TSH 3.98 03/05/2016: ALT 9; BUN 25; Creatinine, Ser 1.20; Potassium 4.7; Sodium 141   Lipid Panel    Component Value Date/Time   CHOL 137 03/05/2016 1008  CHOL 158 01/04/2013 0905   TRIG 116 03/05/2016 1008   TRIG 125 10/18/2014 1435   TRIG 184 (H) 01/04/2013 0905   HDL 53 03/05/2016 1008   HDL 54 10/18/2014 1435   HDL 50 01/04/2013 0905   CHOLHDL 2.6 03/05/2016 1008   LDLCALC 61 03/05/2016 1008   LDLCALC 80 03/15/2014 1024   LDLCALC 71 01/04/2013 0905    Additional studies/ records that were reviewed today include: Summarized above.    ASSESSMENT & PLAN:   1. Paroxysmal atrial fib - generally quiescent, asymptomatic. PVCs on EKG are known from prior. Recent K WNL. Continue Xarelto for stroke prophylaxis. Continue metoprolol. 2. Chronic diastolic CHF - edema has improved with Lasix. Discussed importance of low sodium, elevation of legs, compression hose. She's not interested in any kind of socks at this time. I suspect some of her edema may have been related to varicose vein insufficiency. 3. Essential HTN - has not taken her meds yet this AM and was stressed about driving here. I asked her to follow at home and call if running 0000000 systolic. This was controlled at recent [place]. 4. CKD stage III - stable by recent labs. Will need to follow periodically on Xarelto. 5. Minimal AI by echo 02/2016 - follow clinically.  Disposition: F/u with Dr. Angelena Form in 4 months.   Medication Adjustments/Labs and Tests Ordered: Current medicines are reviewed at length with the patient today.  Concerns regarding medicines are  outlined above. Medication changes, Labs and Tests ordered today are summarized above and listed in the Patient Instructions accessible in Encounters.   Raechel Ache PA-C  03/21/2016 9:44 AM    [address], [place], [place]  [address] Phone: [phone]; Fax: [phone]

## 2016-03-21 ENCOUNTER — Ambulatory Visit (INDEPENDENT_AMBULATORY_CARE_PROVIDER_SITE_OTHER): Payer: Medicare Other | Admitting: Physician Assistant

## 2016-03-21 ENCOUNTER — Encounter: Payer: Self-pay | Admitting: Physician Assistant

## 2016-03-21 VITALS — BP 178/72 | HR 58 | Ht 67.0 in | Wt 168.4 lb

## 2016-03-21 DIAGNOSIS — I5032 Chronic diastolic (congestive) heart failure: Secondary | ICD-10-CM | POA: Diagnosis not present

## 2016-03-21 DIAGNOSIS — N183 Chronic kidney disease, stage 3 unspecified: Secondary | ICD-10-CM

## 2016-03-21 DIAGNOSIS — I10 Essential (primary) hypertension: Secondary | ICD-10-CM

## 2016-03-21 DIAGNOSIS — I480 Paroxysmal atrial fibrillation: Secondary | ICD-10-CM | POA: Diagnosis not present

## 2016-03-21 DIAGNOSIS — I351 Nonrheumatic aortic (valve) insufficiency: Secondary | ICD-10-CM

## 2016-03-21 NOTE — Patient Instructions (Signed)
Medication Instructions:  Your physician recommends that you continue on your current medications as directed. Please refer to the Current Medication list given to you today.   Labwork: None ordered  Testing/Procedures: None ordered  Follow-Up: Your physician wants you to follow-up in: 4 MONTHS WITH DR. Angelena Form  You will receive a reminder letter in the mail two months in advance. If you don't receive a letter, please call our office to schedule the follow-up appointment.   Any Other Special Instructions Will Be Listed Below (If Applicable).  Monitor your blood pressure, if it is running higher than 145 on the top # regularly, please call our office.   If you need a refill on your cardiac medications before your next appointment, please call your pharmacy.

## 2016-03-26 ENCOUNTER — Encounter: Payer: Self-pay | Admitting: Pharmacist

## 2016-03-26 ENCOUNTER — Ambulatory Visit (INDEPENDENT_AMBULATORY_CARE_PROVIDER_SITE_OTHER): Payer: Medicare Other | Admitting: Pharmacist

## 2016-03-26 VITALS — BP 138/62 | HR 64 | Ht 67.0 in | Wt 169.0 lb

## 2016-03-26 DIAGNOSIS — Z0000 Encounter for general adult medical examination without abnormal findings: Secondary | ICD-10-CM | POA: Diagnosis not present

## 2016-03-26 NOTE — Patient Instructions (Addendum)
Katie [name] , Thank you for taking time to come for your Medicare Wellness Visit. I appreciate your ongoing commitment to your health goals. Please review the following plan we discussed and let me know if I can assist you in the future.   These are the goals we discussed:  Please call office if you would like referral for either physical therapy / balance assessment or for hearing specialist / audiologist for hearing assessment.   Try to do chair exercises at home daily - this will help with muscle strength and balance.   Make sure to schedule yearly eye exam with Dr Rona Ravens / Dr Hassell Done [phone])  Continue to eat a variety of fruits and vegetables such as carrots, green bean, squash, zucchini, tomatoes, onions, peppers, spinach and other green leafy vegetables, cabbage, lettuce, cucumbers, asparagus, okra (not fried), eggplant Limit sugar and processed foods (cakes, cookies, ice cream, crackers and chips) Limit red meat to no more than 1-2 times per week (serving size about the size of your palm) Choose whole grains / lean proteins - whole wheat bread, quinoa, whole grain rice (1/2 cup), fish, chicken, [place] Avoid sugar and calorie containing beverages - soda, sweet tea and juice.  Choose water or unsweetened tea instead.  This is a list of the screening recommended for you and due dates:  Health Maintenance  Topic Date Due  . Eye exam for diabetics  01/07/2016  . Pneumonia vaccines (2 of 2 - PPSV23) Completed  . Mammogram  03/13/2016 - has appointment  . Flu Shot  03/18/2016  . Hemoglobin A1C  09/05/2016  . Complete foot exam   03/05/2017  . Colon Cancer Screening  05/28/2017  . DEXA scan (bone density measurement)  08/13/2017  . Tetanus Vaccine  11/30/2021  . Shingles Vaccine  Completed   DASH Eating Plan DASH stands for "Dietary Approaches to Stop Hypertension." The DASH eating plan is a healthy eating plan that has been shown to reduce high blood pressure (hypertension).  Additional health benefits may include reducing the risk of type 2 diabetes mellitus, heart disease, and stroke. The DASH eating plan may also help with weight loss. WHAT DO I NEED TO KNOW ABOUT THE DASH EATING PLAN? For the DASH eating plan, you will follow these general guidelines:  Choose foods with a percent daily value for sodium of less than 5% (as listed on the food label).  Use salt-free seasonings or herbs instead of table salt or sea salt.  Check with your health care provider or pharmacist before using salt substitutes.  Eat lower-sodium products, often labeled as "lower sodium" or "no salt added."  Eat fresh foods.  Eat more vegetables, fruits, and low-fat dairy products.  Choose whole grains. Look for the word "whole" as the first word in the ingredient list.  Choose fish and skinless chicken or [place] more often than red meat. Limit fish, poultry, and meat to 6 oz (170 g) each day.  Limit sweets, desserts, sugars, and sugary drinks.  Choose heart-healthy fats.  Limit cheese to 1 oz (28 g) per day.  Eat more home-cooked food and less restaurant, buffet, and fast food.  Limit fried foods.  Cook foods using methods other than frying.  Limit canned vegetables. If you do use them, rinse them well to decrease the sodium.  When eating at a restaurant, ask that your food be prepared with less salt, or no salt if possible. WHAT FOODS CAN I EAT? Seek help from a dietitian for individual calorie  needs. Grains Whole grain or whole wheat bread. Brown rice. Whole grain or whole wheat pasta. Quinoa, bulgur, and whole grain cereals. Low-sodium cereals. Corn or whole wheat flour tortillas. Whole grain cornbread. Whole grain crackers. Low-sodium crackers. Vegetables Fresh or frozen vegetables (raw, steamed, roasted, or grilled). Low-sodium or reduced-sodium tomato and vegetable juices. Low-sodium or reduced-sodium tomato sauce and paste. Low-sodium or reduced-sodium canned  vegetables.  Fruits All fresh, canned (in natural juice), or frozen fruits. Meat and Other Protein Products Ground beef (85% or leaner), grass-fed beef, or beef trimmed of fat. Skinless chicken or [place]. Ground chicken or [place]. Pork trimmed of fat. All fish and seafood. Eggs. Dried beans, peas, or lentils. Unsalted nuts and seeds. Unsalted canned beans. Dairy Low-fat dairy products, such as skim or 1% milk, 2% or reduced-fat cheeses, low-fat ricotta or cottage cheese, or plain low-fat yogurt. Low-sodium or reduced-sodium cheeses. Fats and Oils Tub margarines without trans fats. Light or reduced-fat mayonnaise and salad dressings (reduced sodium). Avocado. Safflower, olive, or canola oils. Natural peanut or almond butter. Other Unsalted popcorn and pretzels. The items listed above may not be a complete list of recommended foods or beverages. Contact your dietitian for more options. WHAT FOODS ARE NOT RECOMMENDED? Grains White bread. White pasta. White rice. Refined cornbread. Bagels and croissants. Crackers that contain trans fat. Vegetables Creamed or fried vegetables. Vegetables in a cheese sauce. Regular canned vegetables. Regular canned tomato sauce and paste. Regular tomato and vegetable juices. Fruits Dried fruits. Canned fruit in light or heavy syrup. Fruit juice. Meat and Other Protein Products Fatty cuts of meat. Ribs, chicken wings, bacon, sausage, bologna, salami, chitterlings, fatback, hot dogs, bratwurst, and packaged luncheon meats. Salted nuts and seeds. Canned beans with salt. Dairy Whole or 2% milk, cream, half-and-half, and cream cheese. Whole-fat or sweetened yogurt. Full-fat cheeses or blue cheese. Nondairy creamers and whipped toppings. Processed cheese, cheese spreads, or cheese curds. Condiments Onion and garlic salt, seasoned salt, table salt, and sea salt. Canned and packaged gravies. Worcestershire sauce. Tartar sauce. Barbecue sauce. Teriyaki sauce. Soy sauce,  including reduced sodium. Steak sauce. Fish sauce. Oyster sauce. Cocktail sauce. Horseradish. Ketchup and mustard. Meat flavorings and tenderizers. Bouillon cubes. Hot sauce. Tabasco sauce. Marinades. Taco seasonings. Relishes. Fats and Oils Butter, stick margarine, lard, shortening, ghee, and bacon fat. Coconut, palm kernel, or palm oils. Regular salad dressings. Other Pickles and olives. Salted popcorn and pretzels. The items listed above may not be a complete list of foods and beverages to avoid. Contact your dietitian for more information. WHERE CAN I FIND MORE INFORMATION? National Heart, Lung, and Blood Institute: travelstabloid.com   This information is not intended to replace advice given to you by your health care provider. Make sure you discuss any questions you have with your health care provider.   Document Released: 07/24/2011 Document Revised: 08/25/2014 Document Reviewed: 06/08/2013 Elsevier Interactive Patient Education [employer].

## 2016-03-26 NOTE — Progress Notes (Signed)
Patient ID: Katie [name], female   DOB: [birth year], 82 y.o.   MRN: [id number]   Subjective:   Katie [name] is a 82 y.o. female who presents for an Initial Medicare Annual Wellness Visit. Katie [name] is married.  Her husband requires 24 hour care due to amputation and dementia.  She does note caring for him does cause her some stress but she has some help from one of her two sons.   She c/o mild back pain today. She has been seeing Dr Louanne Skye for this and has steroid injection 09/2015.  She thinks it is time for another injection.  Review of Systems  Review of Systems  Constitutional: Negative.   HENT: Positive for hearing loss.   Eyes: Negative.   Respiratory: Negative.   Cardiovascular: Negative.   Gastrointestinal: Negative.   Genitourinary: Negative.   Musculoskeletal: Positive for back pain.  Skin: Negative.   Neurological: Negative.   Endo/Heme/Allergies: Negative.   Psychiatric/Behavioral: Positive for depression.     Current Medications (verified) Outpatient Encounter Prescriptions as of 03/26/2016  Medication Sig  . benazepril (LOTENSIN) 40 MG tablet Take 1 tablet (40 mg total) by mouth daily.  . calcium carbonate (OS-CAL) 600 MG TABS Take 600 mg by mouth daily.   . Cholecalciferol (VITAMIN D) 2000 UNITS CAPS Take 2,000 Units by mouth daily.   [name] etodolac (LODINE) 400 MG tablet Take 1 tablet by mouth two  times daily  . furosemide (LASIX) 40 MG tablet Take one tablet (40mg ) by mouth in the morning daily.  Take 1/2 tablet (20mg ) by mouth in the afternoon daily.  [name] levothyroxine (SYNTHROID, LEVOTHROID) 50 MCG tablet Take 1 tablet (50 mcg total) by mouth daily before breakfast.  . lovastatin (MEVACOR) 40 MG tablet Take 2 tablets (80 mg total) by mouth at bedtime.  . metoprolol tartrate (LOPRESSOR) 25 MG tablet Take 1 tablet (25 mg total) by mouth 2 (two) times daily.  [name] omeprazole (PRILOSEC) 40 MG capsule Take 1 capsule (40 mg total) by mouth daily.  . potassium chloride SA  (KLOR-CON M20) 20 MEQ tablet Take 1 tablet (20 mEq total) by mouth 2 (two) times daily.  . raloxifene (EVISTA) 60 MG tablet TAKE 1 TABLET BY MOUTH  DAILY FOR BONES  . Rivaroxaban (XARELTO) 15 MG TABS tablet Take 1 tablet (15 mg total) by mouth daily with supper.  . traMADol (ULTRAM) 50 MG tablet Take 1 tablet (50 mg total) by mouth 2 (two) times daily as needed for moderate pain or severe pain.   No facility-administered encounter medications on file as of 03/26/2016.     Allergies (verified) Macrodantin; Metformin and related; and Penicillins   History: Past Medical History:  Diagnosis Date  . Aortic insufficiency    a. Trivial AI by echo 02/2016.  [name] Atrial fibrillation and flutter ([place])    a. Coarse afib vs flutter by EKG 12/2015.  . Cataract   . Chronic diastolic CHF (congestive heart failure) ([place])   . CKD (chronic kidney disease), stage III   . Edema   . GERD (gastroesophageal reflux disease)   . Hiatal hernia   . Hypercholesterolemia   . Hypertension   . Hypokalemia   . Hypothyroidism   . Left knee pain   . NIDDM (non-insulin dependent diabetes mellitus)    diet controlled   . Obesity   . Premature atrial contractions   . PVC's (premature ventricular contractions)   . URI (upper respiratory infection)   . Vitamin D deficiency  Past Surgical History:  Procedure Laterality Date  . ABDOMINAL HYSTERECTOMY    . APPENDECTOMY  1980  . BACK SURGERY    . CHOLECYSTECTOMY  5/00  . COLONOSCOPY    . EYE SURGERY    . TONSILLECTOMY    . TOTAL ABDOMINAL HYSTERECTOMY W/ BILATERAL SALPINGOOPHORECTOMY  1980  . UPPER GASTROINTESTINAL ENDOSCOPY     Family History  Problem Relation Age of Onset  . Diabetes Mother   . Stroke Mother   . Heart disease Father   . Uterine cancer Sister   . Cancer Sister   . Diabetes Sister   . Cancer Sister     ovarian and colon  . Ovarian cancer Sister   . Colon cancer Sister   . Diabetes Sister   . Cancer Sister     liver  . Diabetes  Brother   . Dementia Brother   . Atrial fibrillation Sister   . Diabetes Sister   . Heart attack Neg Hx   . Hypertension Neg Hx    Social History   Occupational History  . Retired    Social History Main Topics  . Smoking status: Never Smoker  . Smokeless tobacco: Never Used  . Alcohol use No  . Drug use: No  . Sexual activity: No    Do you feel safe at home?  Yes Are there smokers in your home (other than you)? No  Dietary issues and exercise activities: Current Exercise Habits: The patient does not participate in regular exercise at present  Current Dietary habits:  She and her husband receive meals on wheels during the week and she cooks on the weekends.  She states that prior to meals on wheels they use to eat out more and over the last 2 years she has lost about 15lbs .  Objective:    Today's Vitals   03/26/16 0953  BP: 138/62  Pulse: 64  Weight: 169 lb (76.7 kg)  Height: 5\' 7"  (1.702 m)  PainSc: 2   PainLoc: Back   Body mass index is 26.47 kg/m.  Activities of Daily Living In your present state of health, do you have any difficulty performing the following activities: 03/26/2016  Hearing? Y  Vision? N  Difficulty concentrating or making decisions? N  Walking or climbing stairs? N  Dressing or bathing? N  Doing errands, shopping? N  Preparing Food and eating ? Y  Using the Toilet? N  In the past six months, have you accidently leaked urine? Y  Do you have problems with loss of bowel control? N  Managing your Medications? N  Managing your Finances? N  Housekeeping or managing your Housekeeping? N  Some recent data might be hidden     Cardiac Risk Factors include: advanced age (>74men, >29 women);dyslipidemia;hypertension;microalbuminuria;diabetes mellitus;sedentary lifestyle  Depression Screen PHQ 2/9 Scores 03/26/2016 03/05/2016 01/15/2016 11/13/2015  PHQ - 2 Score 3 0 0 0  PHQ- 9 Score 4 - - -     Fall Risk Fall Risk  03/26/2016 03/05/2016 01/15/2016  11/13/2015 08/24/2015  Falls in the past year? No No No No No  Number falls in past yr: - - - - -  Risk for fall due to : - - - - -  Risk for fall due to (comments): - - - - -    Cognitive Function: MMSE - Mini Mental State Exam 03/26/2016  Orientation to time 5  Orientation to Place 5  Registration 3  Attention/ Calculation 5  Recall 2  Language-  name 2 objects 2  Language- repeat 0  Language- follow 3 step command 3  Language- read & follow direction 1  Write a sentence 1  Copy design 0  Total score 27    Immunizations and Health Maintenance Immunization History  Administered Date(s) Administered  . Influenza,inj,Quad PF,36+ Mos 05/09/2013, 05/18/2014  . Pneumococcal Conjugate-13 02/07/2015  . Pneumococcal Polysaccharide-23 06/30/2012   Health Maintenance Due  Topic Date Due  . OPHTHALMOLOGY EXAM  01/07/2016  . PNA vac Low Risk Adult (2 of 2 - PPSV23) 02/07/2016  . MAMMOGRAM  03/13/2016  . INFLUENZA VACCINE  03/18/2016    Patient Care Team: [name], FNP as PCP - General (Nurse Practitioner) [name], MD as Consulting Physician (Cardiology) Delma Post, PA-C (Physician Assistant) Jessy Oto, MD as Consulting Physician (Orthopedic Surgery)  Indicate any recent Medical Services you may have received from other than [facility] providers in the past year (date may be approximate).    Assessment:    Annual Wellness Visit  Back pain HTN - last BP at cardiologist was 178/72 but was WNL today in office Decreased hearing   Screening Tests Health Maintenance  Topic Date Due  . OPHTHALMOLOGY EXAM  01/07/2016  . PNA vac Low Risk Adult (2 of 2 - PPSV23) 02/07/2016  . MAMMOGRAM  03/13/2016  . INFLUENZA VACCINE  03/18/2016  . HEMOGLOBIN A1C  09/05/2016  . FOOT EXAM  03/05/2017  . COLONOSCOPY  05/28/2017  . DEXA SCAN  08/13/2017  . TETANUS/TDAP  11/30/2021  . ZOSTAVAX  Completed        Plan:   During the course of the visit Thanya was  educated and counseled about the following appropriate screening and preventive services:   Vaccines to include Pneumoccal, Influenza, Td, Zostavax - all vaccines are UTD  Colorectal cancer screening - last colonoscopy 2013  Cardiovascular disease screening - UTD  BP - controlled currently  Diabetes - controlled with diet, last A1c = 7.0%  Bone Denisty / Osteoporosis Screening - UTD  Mammogram - scheduled for October 2017  Glaucoma screening / Diabetic Eye Exam - appt needed - patient reminded of importance of yearly exams and she will call office to schedule  Nutrition counseling - discussed DASH diet  Advanced Directives - no and patient declined information  Physical Activity - gave chair exercises for patient to do at home.  Due to husbands health she is unable to leave home to go to [facility] like she did 2 years ago  Offered referral to audiologist to check hearing - patient declined  Offered referral to PT for balance assessment and treatment if needed - patient delined.   Patient to make appt to see Dr Louanne Skye for back pain    Patient Instructions (the written plan) were given to the patient.   Cherre Robins, PharmD   03/26/2016

## 2016-04-02 DIAGNOSIS — D485 Neoplasm of uncertain behavior of skin: Secondary | ICD-10-CM | POA: Diagnosis not present

## 2016-04-02 DIAGNOSIS — L820 Inflamed seborrheic keratosis: Secondary | ICD-10-CM | POA: Diagnosis not present

## 2016-04-02 DIAGNOSIS — L570 Actinic keratosis: Secondary | ICD-10-CM | POA: Diagnosis not present

## 2016-04-03 DIAGNOSIS — C44329 Squamous cell carcinoma of skin of other parts of face: Secondary | ICD-10-CM | POA: Diagnosis not present

## 2016-04-13 ENCOUNTER — Telehealth: Payer: Self-pay | Admitting: Physician Assistant

## 2016-04-13 NOTE — Telephone Encounter (Signed)
Katie [name] called because she had bloody vomitus yesterday and then found blood in her mouth again this morning.  She felt like she had a knot in the back of her throat and was gagging and coughing and got it up, but it was it was blood. She rinsed her mouth out and went to bed. She slept well but this morning had blood in her mouth again. She is concerned because the Xarelto.  This patients CHA2DS2-VASc Score and unadjusted Ischemic Stroke Rate (% per year) is equal to 9.7 % stroke rate/year from a score of 6 Above score calculated as 1 point each if present [CHF, HTN, DM, or Female], 2 points each if present [Age > 75].  I reviewed the stroke risk with her, and advised her she would need to be careful, but she should hold the Xarelto for now.  She is to increase her omeprazole to twice a day.  She denies feeling lightheaded, or having any black or tarry stools. She prefers not to go to the emergency room today.  She is to call her primary care doctor's office tomorrow and try to get an appointment. I advised her she might need to see a GI doctor, but she should start with primary care. Pt is in agreement with the plan of care.   Rosaria Ferries, Hershal Coria 04/13/2016 11:47 AM Beeper [phone]

## 2016-04-14 ENCOUNTER — Encounter: Payer: Self-pay | Admitting: Nurse Practitioner

## 2016-04-14 ENCOUNTER — Ambulatory Visit (INDEPENDENT_AMBULATORY_CARE_PROVIDER_SITE_OTHER): Payer: Medicare Other | Admitting: Nurse Practitioner

## 2016-04-14 VITALS — BP 144/82 | HR 55 | Temp 97.8°F | Ht 67.0 in | Wt 169.0 lb

## 2016-04-14 DIAGNOSIS — I480 Paroxysmal atrial fibrillation: Secondary | ICD-10-CM | POA: Diagnosis not present

## 2016-04-14 DIAGNOSIS — K920 Hematemesis: Secondary | ICD-10-CM

## 2016-04-14 LAB — FINGERSTICK HEMOGLOBIN: HEMOGLOBIN: 12.7 g/dL (ref 11.1–15.9)

## 2016-04-14 MED ORDER — TRAMADOL HCL 50 MG PO TABS: 50.0000 mg | ORAL_TABLET | Freq: Two times a day (BID) | ORAL | 0 refills | Status: DC | PRN

## 2016-04-14 NOTE — Telephone Encounter (Signed)
Can we check on Katie [name] today? She should be holding Xarelto until she is seen in primary care. Thanks, chris

## 2016-04-14 NOTE — Progress Notes (Signed)
   Subjective:    Patient ID: Katie [name], female    DOB: 04/23/1932, 82 y.o.   MRN: [id number]  HPI Patient said that she woke up Saturday morning feeling like she was choking on something. Went to the bathroom and vomited up a blood clot- she has done it everyday since. SHe has been on xeralto for a couple of months ago and has had no problem up until now. SHe called the cardiology office over the weekend and they told her to stop her xeralto.     Review of Systems  Constitutional: Negative.   HENT: Negative.   Respiratory: Negative.   Cardiovascular: Negative.   Genitourinary: Negative.   Neurological: Negative.   Psychiatric/Behavioral: Negative.   All other systems reviewed and are negative.      Objective:   Physical Exam  Constitutional: She is oriented to person, place, and time. She appears well-developed and well-nourished.  Cardiovascular: Normal rate, regular rhythm and normal heart sounds.   Pulmonary/Chest: Effort normal and breath sounds normal.  Neurological: She is alert and oriented to person, place, and time.  Skin: Skin is warm.  Psychiatric: She has a normal mood and affect. Her behavior is normal. Judgment and thought content normal.   BP (!) 144/82 (BP Location: Right Arm, Cuff Size: Normal)   Pulse (!) 55   Temp 97.8 F (36.6 C) (Oral)   Ht 5\' 7"  (1.702 m)   Wt 169 lb (76.7 kg)   BMI 26.47 kg/m          Assessment & Plan:  1. Paroxysmal atrial fibrillation (HCC) Stop xeralto I will contact cardiology and see what they would like for you to do.  2. Hematemesis without nausea Will wait on results - Ambulatory referral to Gastroenterology  [place], [place]

## 2016-04-14 NOTE — Telephone Encounter (Signed)
Patient has appointment today with her primary care provider and was instructed this am to stop Eliquis until further instructed.

## 2016-04-14 NOTE — Patient Instructions (Signed)
Hematemesis Hematemesis is when you vomit blood. It is a sign of bleeding in the upper part of your digestive tract. This is also called your gastrointestinal (GI) tract. Your upper GI tract includes your mouth, throat, esophagus, stomach, and the first part of your small intestine (duodenum).  Hematemesis is usually caused by bleeding from your esophagus or stomach. You may suddenly vomit bright red blood. You might also vomit old blood. It may look like coffee grounds. You may also have other symptoms, such as:  Stomach pain.  Heartburn.  Black and tarry stool.  HOME CARE INSTRUCTIONS  Watch your hematemesis for any changes. The following actions may help to lessen any discomfort you are feeling:  Take medicines only as directed by your health care provider. Do not take aspirin, ibuprofen, or any other anti-inflammatory medicine without approval from your health care provider.  Rest as needed.  Drink small sips of clear liquids often, as long as you can keep them down. Try to drink enough fluids to keep your urine clear or pale yellow.  Do not drink alcohol.  Do not use any tobacco products, including cigarettes, chewing tobacco, or electronic cigarettes. If you need help quitting, ask your health care provider.  Keep all follow-up visits as directed by your health care provider. This is important. SEEK MEDICAL CARE IF:   The vomiting of blood worsens, or begins again after it has stopped.  You have persistent stomach pain.  You have nausea, indigestion, or heartburn.  You feel weak or dizzy. SEEK IMMEDIATE MEDICAL CARE IF:   You faint or feel extremely weak.  You have a rapid heartbeat.  You are urinating less than normal or not at all.  You have persistent vomiting.  You vomit large amounts of bloody or dark material.  You vomit bright red blood.  You pass large, dark, or bloody stools.  You have chest pain or trouble breathing.   This information is not  intended to replace advice given to you by your health care provider. Make sure you discuss any questions you have with your health care provider.   Document Released: 09/11/2004 Document Revised: 08/25/2014 Document Reviewed: 03/29/2014 Elsevier Interactive Patient Education [employer].

## 2016-04-14 NOTE — Telephone Encounter (Signed)
Please call and check on patient. Make sure sheis holding xeralto

## 2016-04-14 NOTE — Addendum Note (Signed)
Addended by: [name] on: 04/14/2016 04:34 PM   Modules accepted: Orders

## 2016-04-14 NOTE — Telephone Encounter (Signed)
Follow up appt made to see MMM.  Patient states she did take xarelto last night 04/13/16.  Patient informed to hold xarelto until further notice.

## 2016-04-15 ENCOUNTER — Telehealth: Payer: Self-pay

## 2016-04-15 NOTE — Telephone Encounter (Signed)
lmtcb to discuss recommendations from [facility] about previous questions pt had about Xarelto and plan for care.

## 2016-04-15 NOTE — Telephone Encounter (Signed)
Mrs. Redmond Pulling called this morning in regards to stopping her blood thinner. Per her she woke up Friday with a mouth full of blood and was told by triage to contact her PCP. She followed up with her PCP and her PCP told her to STOP taking her blood thinner. She would like to speak with jennifer Witty or Melina Copa in regards to whether or not she needs to stay off the blood thinner or switch to a different medication. I informed the patient that I could take a message and send it to [place] and Lisbeth Renshaw to advise. Pt is agreeable and is waiting for response. Sent message to Jeanann Lewandowsky and Melina Copa to advise.

## 2016-04-15 NOTE — Telephone Encounter (Signed)
Pt returned my cal to discuss plan per Melina Copa. Dayna instructed me  let pt know thinners only cause bleeding if there is an underlying issue that is likely to bleed, which means further workup is needed before she restarts. I also informed her that Dayna noted she has been referred to GI for hematemesis and asked to hold her Xarelto by her PCP. Her Hgb was stable. She is at increased risk of stroke by being off the Xarelto.   Dayna wanted her to keep f/u with GI as planned and we will have to await clearance from them before restarting blood thinner. I also informed her that per Dayna theres is no need of changing to a different one will make a difference because we have already identified there is a potential site of bleeding that needs further eval before doing so and she is already on one of the lower dose meds.   Dayna wants her to keep her follow up with Dr. Angelena Form as directed. She informed me that she hasnt scheduled the appt yet because it is a couple months out. I told her that is fine just make sure she gets it scheduled.   I told her to that we would follow up with her GI doctor after her appt to discuss with them a plan of action if they want her to stay off the Xarelto.  She is noticeable worried about being off the Xarelto and was very concerned with what the next course of action would be should they decide to keep her off the Xarelto. I assured her that we would stay on top of it and make sure that she has a current plan for therapy. She thanked me and told me should would follow up with me after her GI appt.   I assured her that if she had any questions she could contact me at anytime. She was agreeable and understood.

## 2016-04-16 ENCOUNTER — Telehealth: Payer: Self-pay | Admitting: *Deleted

## 2016-04-16 ENCOUNTER — Ambulatory Visit (INDEPENDENT_AMBULATORY_CARE_PROVIDER_SITE_OTHER): Payer: Medicare Other | Admitting: Gastroenterology

## 2016-04-16 ENCOUNTER — Encounter: Payer: Self-pay | Admitting: Gastroenterology

## 2016-04-16 VITALS — BP 130/60 | HR 60 | Ht 67.0 in | Wt 167.6 lb

## 2016-04-16 DIAGNOSIS — R041 Hemorrhage from throat: Secondary | ICD-10-CM

## 2016-04-16 DIAGNOSIS — J392 Other diseases of pharynx: Secondary | ICD-10-CM | POA: Diagnosis not present

## 2016-04-16 NOTE — Telephone Encounter (Signed)
-----   Message from Charlie Pitter, [place] sent at 04/15/2016 12:21 PM EDT ----- Contact: [phone] Please let patient know thinners only cause bleeding if there is an underlying issue that is likely to bleed, which means further workup is needed before she restarts.  I see she has been referred to GI for hematemesis and asked to hold her Xarelto by her PCP. Her Hgb was stable. She is at increased risk of stroke by being off the Xarelto, but by the same token we need to weigh risk of bleeding as well - she needs to keep f/u with GI as planned. We will have to await clearance from them before restarting blood thinner. I do not think changing to a different one will make a difference because a) we have already identified there is a potential site of bleeding that needs further eval before doing so and b) she is already on one of the lower dose meds.   Keep f/u with Dr. Angelena Form as previously recommended.     ----- Message ----- From: Bobby Rumpf, CMA Sent: 04/15/2016  10:43 AM To: Charlie Pitter, PA-C, Jeanann Lewandowsky, RMA  Anderson Malta, Mrs. Redmond Pulling called this morning in regards to stopping her blood thinner. Per her she woke up Friday with a mouth full of blood and was told by triage to contact her PCP. She followed up with her PCP and her PCP told her to STOP taking her blood thinner. She would like to speak with you or Dayna Dunn in regards to whether or not she needs to stay off the blood thinner or switch to a different medication. The number listed above is the preferred contact number per the pt.  Thanks, Fernande Bras, CMA

## 2016-04-16 NOTE — Telephone Encounter (Signed)
Called pt, per Melina Copa, PA-C, [phone] below.  Lm with Judeen Hammans to have pt return my call.

## 2016-04-16 NOTE — Patient Instructions (Signed)
Restart Xarelto tonight.   If you have any problems/bleeding call your family doctor for a referral to see ENT doctor.

## 2016-04-16 NOTE — Progress Notes (Signed)
04/16/2016 SISTER SCHLAGETER JM:[id number] 1931-10-12   HISTORY OF PRESENT ILLNESS:  This is an 82 year old female who is previously known to Dr. Carlean Purl for colonoscopy in 2013.  She is on Xarelto for atrial fibrillation.  She presents to our office today at the request of her PCP and her cardiologist for evaluation of "blood in mouth".  She tells me that Sunday and Monday morning she woke up with blood in her mouth and had a sensation of something in her throat so had to cough/clear her throat to get it out and then spit blood into the sink.  She denies vomiting.  Denies black or bloody stools.  No abdominal pain.  Is on omeprazole 40 mg daily and feels that it has been controlling any reflux symptoms well.  She has not taken her Xarelto since Sunday night and was told to continue to hold it until she was seen here. She does admit that she's had sinus and congestion issues in the past.  Fingerstick Hgb at PCP's office on 8/30 was WNL's at 12.7 grams.  Has not noticed any further blood since Monday morning.   Past Medical History:  Diagnosis Date  . Aortic insufficiency    a. Trivial AI by echo 02/2016.  [name] Atrial fibrillation and flutter ([place])    a. Coarse afib vs flutter by EKG 12/2015.  . Cataract   . Chronic diastolic CHF (congestive heart failure) ([place])   . CKD (chronic kidney disease), stage III   . Edema   . GERD (gastroesophageal reflux disease)   . Hiatal hernia   . Hypercholesterolemia   . Hypertension   . Hypokalemia   . Hypothyroidism   . Left knee pain   . NIDDM (non-insulin dependent diabetes mellitus)    diet controlled   . Obesity   . Premature atrial contractions   . PVC's (premature ventricular contractions)   . URI (upper respiratory infection)   . Vitamin D deficiency    Past Surgical History:  Procedure Laterality Date  . APPENDECTOMY  1980  . BACK SURGERY    . CATARACT EXTRACTION, BILATERAL    . CHOLECYSTECTOMY  5/00  . COLONOSCOPY    . TONSILLECTOMY    .  TOTAL ABDOMINAL HYSTERECTOMY W/ BILATERAL SALPINGOOPHORECTOMY  1980  . UPPER GASTROINTESTINAL ENDOSCOPY      reports that she has never smoked. She has never used smokeless tobacco. She reports that she does not drink alcohol or use drugs. family history includes Atrial fibrillation in her sister; Colon cancer in her sister; Dementia in her brother; Diabetes in her brother, mother, sister, sister, and sister; Heart disease in her father; Liver cancer in her sister; Ovarian cancer in her sister; Stroke in her mother; Uterine cancer in her sister. Allergies  Allergen Reactions  . Macrodantin Nausea And Vomiting  . Metformin And Related Nausea And Vomiting and Other (See Comments)    Bloating  . Penicillins Rash      Outpatient Encounter Prescriptions as of 04/16/2016  Medication Sig  . benazepril (LOTENSIN) 40 MG tablet Take 1 tablet (40 mg total) by mouth daily.  . calcium carbonate (OS-CAL) 600 MG TABS Take 600 mg by mouth daily.   . Cholecalciferol (VITAMIN D) 2000 UNITS CAPS Take 2,000 Units by mouth daily.   [name] etodolac (LODINE) 400 MG tablet Take 1 tablet by mouth two  times daily  . furosemide (LASIX) 40 MG tablet Take one tablet (40mg ) by mouth in the morning daily.  Take 1/2 tablet (20mg ) by mouth in the afternoon daily.  [name] levothyroxine (SYNTHROID, LEVOTHROID) 50 MCG tablet Take 1 tablet (50 mcg total) by mouth daily before breakfast.  . lovastatin (MEVACOR) 40 MG tablet Take 2 tablets (80 mg total) by mouth at bedtime.  . metoprolol tartrate (LOPRESSOR) 25 MG tablet Take 1 tablet (25 mg total) by mouth 2 (two) times daily.  [name] omeprazole (PRILOSEC) 40 MG capsule Take 1 capsule (40 mg total) by mouth daily.  . potassium chloride SA (KLOR-CON M20) 20 MEQ tablet Take 1 tablet (20 mEq total) by mouth 2 (two) times daily.  . raloxifene (EVISTA) 60 MG tablet TAKE 1 TABLET BY MOUTH  DAILY FOR BONES  . traMADol (ULTRAM) 50 MG tablet Take 1 tablet (50 mg total) by mouth 2 (two) times daily as  needed for moderate pain or severe pain.  . Rivaroxaban (XARELTO) 15 MG TABS tablet Take 1 tablet (15 mg total) by mouth daily with supper. (Patient not taking: Reported on 04/16/2016)   No facility-administered encounter medications on file as of 04/16/2016.      REVIEW OF SYSTEMS  : All other systems reviewed and negative except where noted in the History of Present Illness.   PHYSICAL EXAM: BP 130/60   Pulse 60   Ht 5\' 7"  (1.702 m)   Wt 167 lb 9.6 oz (76 kg)   BMI 26.25 kg/m  General: Well developed white female in no acute distress Head: Normocephalic and atraumatic Eyes:  Sclerae anicteric, conjunctiva pink. Ears: Normal auditory acuity Nose:  Nares are clear. Mouth:  No obvious abnormalities in the mouth or throat. Lungs: Clear throughout to auscultation Heart: Regular rate and rhythm Abdomen: Soft, non-distended.  Normal bowel sounds.  Non-tender. Musculoskeletal: Symmetrical with no gross deformities  Skin: No lesions on visible extremities Extremities: No edema  Neurological: Alert oriented x 4, grossly non-focal Psychological:  Alert and cooperative. Normal mood and affect  ASSESSMENT AND PLAN: -82 year old female on xarelto who had two episodes of blood in her mouth upon waking in the morning.followed by coughing and clearing her throat to to eliminate sensation in her throat.  She did not vomit.  Bleeding sounds nasopharyngeal, not gastrointestinal.  I discussed with Dr. Carlean Purl and he agreed to restart her Xarelto and monitor for further bleeding.  If bleeding recurs then needs to contact PCP and/or cardiology for possible ENT referral. -Chronic anticoagulation with coumadin for atrial fibrillation   CC:  Hassell Done, Mary-Margaret, *

## 2016-04-17 ENCOUNTER — Telehealth: Payer: Self-pay

## 2016-04-17 NOTE — Telephone Encounter (Signed)
Lmtcb. Called to follow up with patient per our last conversation about what her GI recommended in regards to Xarelto theraphy

## 2016-04-18 DIAGNOSIS — D0439 Carcinoma in situ of skin of other parts of face: Secondary | ICD-10-CM | POA: Diagnosis not present

## 2016-04-18 DIAGNOSIS — C44329 Squamous cell carcinoma of skin of other parts of face: Secondary | ICD-10-CM | POA: Diagnosis not present

## 2016-04-20 NOTE — Progress Notes (Signed)
Agree with Ms. Zehr's management.  Carl E. Gessner, MD, FACG  

## 2016-04-21 ENCOUNTER — Other Ambulatory Visit: Payer: Self-pay | Admitting: Nurse Practitioner

## 2016-05-26 ENCOUNTER — Other Ambulatory Visit: Payer: Self-pay | Admitting: Nurse Practitioner

## 2016-05-29 ENCOUNTER — Other Ambulatory Visit: Payer: Self-pay

## 2016-05-29 MED ORDER — TRAMADOL HCL 50 MG PO TABS: 50.0000 mg | ORAL_TABLET | Freq: Two times a day (BID) | ORAL | 0 refills | Status: DC | PRN

## 2016-05-29 NOTE — Telephone Encounter (Signed)
Last seen and filled by MMM on 04/14/16. Route to pool B

## 2016-06-01 ENCOUNTER — Other Ambulatory Visit: Payer: Self-pay | Admitting: Nurse Practitioner

## 2016-06-02 NOTE — Telephone Encounter (Signed)
Last filled 04/17/16, last seen 03/21/16. Phone in

## 2016-06-03 ENCOUNTER — Other Ambulatory Visit: Payer: Self-pay | Admitting: Nurse Practitioner

## 2016-06-03 NOTE — Telephone Encounter (Signed)
Let patient know that needs to be seen to get pain med filled- office policy

## 2016-06-03 NOTE — Telephone Encounter (Signed)
Anyway looks like carol filled on 05/29/16

## 2016-06-03 NOTE — Telephone Encounter (Signed)
Will fill this time but if is going to take on regular basis has to follow ain protocol.

## 2016-06-04 DIAGNOSIS — D225 Melanocytic nevi of trunk: Secondary | ICD-10-CM | POA: Diagnosis not present

## 2016-06-04 DIAGNOSIS — Z85828 Personal history of other malignant neoplasm of skin: Secondary | ICD-10-CM | POA: Diagnosis not present

## 2016-06-04 DIAGNOSIS — L570 Actinic keratosis: Secondary | ICD-10-CM | POA: Diagnosis not present

## 2016-06-10 ENCOUNTER — Encounter: Payer: Medicare Other | Admitting: *Deleted

## 2016-06-10 ENCOUNTER — Ambulatory Visit (INDEPENDENT_AMBULATORY_CARE_PROVIDER_SITE_OTHER): Payer: Medicare Other

## 2016-06-10 ENCOUNTER — Encounter: Payer: Self-pay | Admitting: Nurse Practitioner

## 2016-06-10 ENCOUNTER — Ambulatory Visit (INDEPENDENT_AMBULATORY_CARE_PROVIDER_SITE_OTHER): Payer: Medicare Other | Admitting: Nurse Practitioner

## 2016-06-10 VITALS — BP 162/82 | HR 68 | Temp 98.3°F | Ht 67.0 in | Wt 169.0 lb

## 2016-06-10 DIAGNOSIS — Z1231 Encounter for screening mammogram for malignant neoplasm of breast: Secondary | ICD-10-CM | POA: Diagnosis not present

## 2016-06-10 DIAGNOSIS — E119 Type 2 diabetes mellitus without complications: Secondary | ICD-10-CM

## 2016-06-10 DIAGNOSIS — E785 Hyperlipidemia, unspecified: Secondary | ICD-10-CM | POA: Diagnosis not present

## 2016-06-10 DIAGNOSIS — E876 Hypokalemia: Secondary | ICD-10-CM

## 2016-06-10 DIAGNOSIS — N183 Chronic kidney disease, stage 3 unspecified: Secondary | ICD-10-CM

## 2016-06-10 DIAGNOSIS — I10 Essential (primary) hypertension: Secondary | ICD-10-CM | POA: Diagnosis not present

## 2016-06-10 DIAGNOSIS — E782 Mixed hyperlipidemia: Secondary | ICD-10-CM | POA: Diagnosis not present

## 2016-06-10 DIAGNOSIS — M8580 Other specified disorders of bone density and structure, unspecified site: Secondary | ICD-10-CM

## 2016-06-10 DIAGNOSIS — R609 Edema, unspecified: Secondary | ICD-10-CM

## 2016-06-10 DIAGNOSIS — E032 Hypothyroidism due to medicaments and other exogenous substances: Secondary | ICD-10-CM

## 2016-06-10 DIAGNOSIS — K219 Gastro-esophageal reflux disease without esophagitis: Secondary | ICD-10-CM | POA: Diagnosis not present

## 2016-06-10 DIAGNOSIS — K5909 Other constipation: Secondary | ICD-10-CM

## 2016-06-10 LAB — BAYER DCA HB A1C WAIVED: HB A1C (BAYER DCA - WAIVED): 6.5 % (ref <7.0)

## 2016-06-10 MED ORDER — OMEPRAZOLE 40 MG PO CPDR: 40.0000 mg | DELAYED_RELEASE_CAPSULE | Freq: Every day | ORAL | 1 refills | Status: DC

## 2016-06-10 MED ORDER — LEVOTHYROXINE SODIUM 50 MCG PO TABS: 50.0000 ug | ORAL_TABLET | Freq: Every day | ORAL | 1 refills | Status: DC

## 2016-06-10 MED ORDER — POTASSIUM CHLORIDE CRYS ER 20 MEQ PO TBCR: 20.0000 meq | EXTENDED_RELEASE_TABLET | Freq: Two times a day (BID) | ORAL | 1 refills | Status: DC

## 2016-06-10 MED ORDER — RALOXIFENE HCL 60 MG PO TABS: ORAL_TABLET | ORAL | 1 refills | Status: DC

## 2016-06-10 MED ORDER — BENAZEPRIL HCL 40 MG PO TABS: 40.0000 mg | ORAL_TABLET | Freq: Every day | ORAL | 1 refills | Status: DC

## 2016-06-10 MED ORDER — FUROSEMIDE 40 MG PO TABS: ORAL_TABLET | ORAL | 2 refills | Status: DC

## 2016-06-10 MED ORDER — TRAMADOL HCL 50 MG PO TABS: 50.0000 mg | ORAL_TABLET | Freq: Two times a day (BID) | ORAL | 1 refills | Status: DC | PRN

## 2016-06-10 MED ORDER — LOVASTATIN 40 MG PO TABS: 80.0000 mg | ORAL_TABLET | Freq: Every day | ORAL | 1 refills | Status: DC

## 2016-06-10 MED ORDER — METOPROLOL TARTRATE 25 MG PO TABS: 25.0000 mg | ORAL_TABLET | Freq: Two times a day (BID) | ORAL | 1 refills | Status: DC

## 2016-06-10 MED ORDER — TRAMADOL HCL 50 MG PO TABS: 50.0000 mg | ORAL_TABLET | Freq: Two times a day (BID) | ORAL | 0 refills | Status: DC | PRN

## 2016-06-10 NOTE — Progress Notes (Signed)
Subjective:    Patient ID: Katie [name], female    DOB: 1932/04/26, 82 y.o.   MRN: [id number]  Patient here today for follow up of chronic medical problems. She is doing well today without complaints. 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 She reports being burnt out.She denies being depressed.   Outpatient Encounter Prescriptions as of 06/10/2016  Medication Sig  . benazepril (LOTENSIN) 40 MG tablet Take 1 tablet (40 mg total) by mouth daily.  . calcium carbonate (OS-CAL) 600 MG TABS Take 600 mg by mouth daily.   . Cholecalciferol (VITAMIN D) 2000 UNITS CAPS Take 2,000 Units by mouth daily.   [name] etodolac (LODINE) 400 MG tablet Take 1 tablet by mouth two  times daily  . furosemide (LASIX) 40 MG tablet Take one tablet (36m) by mouth in the morning daily.  Take 1/2 tablet (264m by mouth in the afternoon daily.  . [name] (SYNTHROID, LEVOTHROID) 50 MCG tablet Take 1 tablet (50 mcg total) by mouth daily before breakfast.  . lovastatin (MEVACOR) 40 MG tablet Take 2 tablets (80 mg total) by mouth at bedtime.  . metoprolol tartrate (LOPRESSOR) 25 MG tablet Take 1 tablet (25 mg total) by mouth 2 (two) times daily.  . [name] (PRILOSEC) 40 MG capsule Take 1 capsule (40 mg total) by mouth daily.  . potassium chloride SA (KLOR-CON M20) 20 MEQ tablet Take 1 tablet (20 mEq total) by mouth 2 (two) times daily.  . raloxifene (EVISTA) 60 MG tablet TAKE 1 TABLET BY MOUTH  DAILY FOR BONES  . Rivaroxaban (XARELTO) 15 MG TABS tablet Take 1 tablet (15 mg total) by mouth daily with supper.  . traMADol (ULTRAM) 50 MG tablet Take 1 tablet (50 mg total) by mouth 2 (two) times daily as needed for moderate pain or severe pain.   No facility-administered encounter medications on file as of 06/10/2016.     Hypertension  This is a chronic problem. The current episode started more than 1 year ago. The problem is unchanged. The problem is controlled. Pertinent negatives include no  chest pain, headaches, neck pain, palpitations, peripheral edema or shortness of breath. Risk factors for coronary artery disease include sedentary lifestyle, post-menopausal state, dyslipidemia and diabetes mellitus. Past treatments include ACE inhibitors, calcium channel blockers and diuretics. The current treatment provides moderate improvement. There are no compliance problems.  Hypertensive end-organ damage includes a thyroid problem.  Hyperlipidemia  This is a chronic problem. The current episode started more than 1 year ago. The problem is uncontrolled. Recent lipid tests were reviewed and are high. Exacerbating diseases include diabetes and hypothyroidism. She has no history of obesity. Pertinent negatives include no chest pain, myalgias or shortness of breath. Current antihyperlipidemic treatment includes statins. The current treatment provides moderate improvement of lipids. Compliance problems include adherence to diet and adherence to exercise.  Risk factors for coronary artery disease include dyslipidemia, family history, hypertension and post-menopausal.  Diabetes  She presents for her follow-up diabetic visit. She has type 2 diabetes mellitus. No MedicAlert identification noted. Her disease course has been stable. Pertinent negatives for hypoglycemia include no headaches. Pertinent negatives for diabetes include no chest pain, no visual change and no weakness. Symptoms are stable. Risk factors for coronary artery disease include diabetes mellitus, dyslipidemia, family history, hypertension and post-menopausal. Current diabetic treatment includes diet. She is compliant with treatment most of the time. When asked about meal planning, she reported none. She has not had a previous  visit with a dietitian. She rarely participates in exercise. Home blood sugar record trend: does not chesk blood sugars very often. Her breakfast blood glucose is taken between 8-9 am. Her breakfast blood glucose range is  generally 140-180 mg/dl. (Patient does not check daily blood sugars) An ACE inhibitor/angiotensin II receptor blocker is being taken. She does not see a podiatrist.Eye exam is not current.  Thyroid Problem  Presents for follow-up (hypothyroidism) visit. Patient reports no constipation, palpitations or visual change. The symptoms have been stable. Her past medical history is significant for diabetes and hyperlipidemia.  GERD Omeprazole keeps symptoms under control Hypokalemia Potassium supplements daily- no c/o lower extremity cramping Peripheral edema Lasix keeps under control- since decreased amlodopine to 5 mg daily has gotten much better Chronic back pain Flexeril, etodilac and ultram as needed- somedays she does not have to take at all. 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 Constipation  Patient currently not doing anything for constipation CKD stage III Currently just watching lab results  Review of Systems  Respiratory: Negative for shortness of breath.   Cardiovascular: Negative for chest pain and palpitations.  Gastrointestinal: Negative for constipation.  Musculoskeletal: Negative for myalgias and neck pain.  Neurological: Negative for weakness and headaches.  Hematological: Does not bruise/bleed easily.  Psychiatric/Behavioral: Negative.  Negative for suicidal ideas.  All other systems reviewed and are negative.      Objective:   Physical Exam  Constitutional: She is oriented to person, place, and time. She appears well-developed and well-nourished.  Hard of hearing  HENT:  Nose: Nose normal.  Mouth/Throat: Oropharynx is clear and moist.  Eyes: EOM are normal.  Neck: Trachea normal, normal range of motion and full passive range of motion without pain. Neck supple. No JVD present. Carotid bruit is not present. No thyromegaly present.  Cardiovascular: Normal rate, normal heart sounds and intact distal pulses.  Exam reveals no gallop and no friction rub.     No murmur heard. Irregular rhythym  Pulmonary/Chest: Effort normal and breath sounds normal.  Abdominal: Soft. Bowel sounds are normal. She exhibits no distension and no mass. There is no tenderness.  Musculoskeletal: Normal range of motion. She exhibits no edema.  Lymphadenopathy:    She has no cervical adenopathy.  Neurological: She is alert and oriented to person, place, and time. She has normal reflexes.  Skin: Skin is warm and dry.  Psychiatric: She has a normal mood and affect. Her behavior is normal. Judgment and thought content normal.   BP (!) 162/82 (BP Location: Right Arm, Cuff Size: Large)   Pulse 68   Temp 98.3 F (36.8 C) (Oral)   Ht '5\' 7"'  (1.702 m)   Wt 169 lb (76.7 kg)   BMI 26.47 kg/m   BP 140/80 (BP Location: Left Arm, Cuff Size: Large)   Pulse 68   Temp 98.3 F (36.8 C) (Oral)   Ht '5\' 7"'  (1.702 m)   Wt 169 lb (76.7 kg)   BMI 26.47 kg/m   hgba1c is 6.5% down from 7.0%     Assessment & Plan:  1. Hyperlipidemia, unspecified hyperlipidemia type Low fat diet - Lipid panel  2. Diabetes mellitus type 2, diet-controlled ([place]) Low car diet - Bayer DCA Hb A1c Waived  3. Essential hypertension Low salt diet, do not add extra salt to meals - CMP14+EGFR - DG Chest 2 View; Future - benazepril (LOTENSIN) 40 MG tablet; Take 1 tablet (40 mg total) by mouth daily.  Dispense: 90  tablet; Refill: 1 - metoprolol tartrate (LOPRESSOR) 25 MG tablet; Take 1 tablet (25 mg total) by mouth 2 (two) times daily.  Dispense: 180 tablet; Refill: 1   5. Other constipation Low fiber diet  6. Peripheral edema Elevate legs when sitting - furosemide (LASIX) 40 MG tablet; Take one tablet (7m) by mouth in the morning daily.  Take 1/2 tablet (221m by mouth in the afternoon daily.  Dispense: 135 tablet; Refill: 2  7. Mixed hyperlipidemia Low fat diet - lovastatin (MEVACOR) 40 MG tablet; Take 2 tablets (80 mg total) by mouth at bedtime.  Dispense: 180 tablet; Refill: 1 - DG  Chest 2 View; Future  8. Gastroesophageal reflux disease, esophagitis presence not specified Avoid spicy food Do not eat 2hrs before bed time - omeprazole (PRILOSEC) 40 MG capsule; Take 1 capsule (40 mg total) by mouth daily.  Dispense: 90 capsule; Refill: 1  9. Hypokalemia  - potassium chloride SA (KLOR-CON M20) 20 MEQ tablet; Take 1 tablet (20 mEq total) by mouth 2 (two) times daily.  Dispense: 180 tablet; Refill: 1  10. CKD (chronic kidney disease) stage 3, GFR 30-59 ml/min   11. Hypothyroidism due to non-medication exogenous substances  - levothyroxine (SYNTHROID, LEVOTHROID) 50 MCG tablet; Take 1 tablet (50 mcg total) by mouth daily before breakfast.  Dispense: 90 tablet; Refill: 1 - metoprolol tartrate (LOPRESSOR) 25 MG tablet; Take 1 tablet (25 mg total) by mouth 2 (two) times daily.  Dispense: 180 tablet; Refill: 1  12. Osteopenia, senile Discussed new pain medication policy and patient got very upset. I told her she would need appointmnet for pain  Only to sign contract. - traMADol (ULTRAM) 50 MG tablet; Take 1 tablet (50 mg total) by mouth 2 (two) times daily as needed for moderate pain or severe pain.  Dispense: 60 tablet; Refill: 1    Labs pending Health maintenance reviewed Diet and exercise encouraged Continue all meds  Follow up  In 3 months  QuJari FavreRN, NP Student    Katie MaHassell DoneFNP

## 2016-06-10 NOTE — Patient Instructions (Signed)

## 2016-06-11 LAB — LIPID PANEL
CHOL/HDL RATIO: 2.6 ratio (ref 0.0–4.4)
Cholesterol, Total: 144 mg/dL (ref 100–199)
HDL: 56 mg/dL (ref >39)
LDL CALC: 57 mg/dL (ref 0–99)
TRIGLYCERIDES: 153 mg/dL — AB (ref 0–149)
VLDL Cholesterol Cal: 31 mg/dL (ref 5–40)

## 2016-06-11 LAB — CMP14+EGFR
A/G RATIO: 1.4 (ref 1.2–2.2)
ALBUMIN: 4 g/dL (ref 3.5–4.7)
ALT: 8 IU/L (ref 0–32)
AST: 13 IU/L (ref 0–40)
Alkaline Phosphatase: 71 IU/L (ref 39–117)
BUN / CREAT RATIO: 19 (ref 12–28)
BUN: 25 mg/dL (ref 8–27)
Bilirubin Total: 0.5 mg/dL (ref 0.0–1.2)
CALCIUM: 9.3 mg/dL (ref 8.7–10.3)
CO2: 25 mmol/L (ref 18–29)
CREATININE: 1.31 mg/dL — AB (ref 0.57–1.00)
Chloride: 103 mmol/L (ref 96–106)
GFR, EST AFRICAN AMERICAN: 44 mL/min/{1.73_m2} — AB (ref >59)
GFR, EST NON AFRICAN AMERICAN: 38 mL/min/{1.73_m2} — AB (ref >59)
GLOBULIN, TOTAL: 2.9 g/dL (ref 1.5–4.5)
Glucose: 132 mg/dL — ABNORMAL HIGH (ref 65–99)
POTASSIUM: 4.4 mmol/L (ref 3.5–5.2)
SODIUM: 142 mmol/L (ref 134–144)
TOTAL PROTEIN: 6.9 g/dL (ref 6.0–8.5)

## 2016-06-13 ENCOUNTER — Ambulatory Visit: Payer: Medicare Other | Admitting: Nurse Practitioner

## 2016-06-17 ENCOUNTER — Encounter: Payer: Self-pay | Admitting: Nurse Practitioner

## 2016-07-11 ENCOUNTER — Telehealth: Payer: Self-pay | Admitting: Nurse Practitioner

## 2016-07-11 ENCOUNTER — Other Ambulatory Visit: Payer: Self-pay | Admitting: Nurse Practitioner

## 2016-07-11 MED ORDER — ETODOLAC 400 MG PO TABS: 400.0000 mg | ORAL_TABLET | Freq: Two times a day (BID) | ORAL | 0 refills | Status: DC

## 2016-07-11 NOTE — Progress Notes (Signed)
Etodolac refilled- need to be very careful taking because can mess with kidney function- will need creatine checked at next follow up visit.

## 2016-08-26 ENCOUNTER — Ambulatory Visit (INDEPENDENT_AMBULATORY_CARE_PROVIDER_SITE_OTHER): Payer: Medicare Other | Admitting: Nurse Practitioner

## 2016-08-26 ENCOUNTER — Encounter: Payer: Self-pay | Admitting: Nurse Practitioner

## 2016-08-26 VITALS — BP 144/66 | HR 53 | Temp 97.7°F | Ht 67.0 in | Wt 169.0 lb

## 2016-08-26 DIAGNOSIS — M8580 Other specified disorders of bone density and structure, unspecified site: Secondary | ICD-10-CM | POA: Diagnosis not present

## 2016-08-26 DIAGNOSIS — J0100 Acute maxillary sinusitis, unspecified: Secondary | ICD-10-CM

## 2016-08-26 MED ORDER — AZITHROMYCIN 250 MG PO TABS: ORAL_TABLET | ORAL | 0 refills | Status: DC

## 2016-08-26 MED ORDER — TRAMADOL HCL 50 MG PO TABS: 50.0000 mg | ORAL_TABLET | Freq: Two times a day (BID) | ORAL | 1 refills | Status: DC | PRN

## 2016-08-26 NOTE — Patient Instructions (Signed)

## 2016-08-26 NOTE — Progress Notes (Signed)
Subjective:     Katie [name] is a 83 y.o. female who presents for evaluation of sinus pain. Symptoms include: congestion, facial pain, headaches, nasal congestion and bil ear sstopped up. Onset of symptoms was 1 week ago. Symptoms have been gradually worsening since that time. Past history is significant for no history of pneumonia or bronchitis. Patient is a non-smoker.  The following portions of the patient's history were reviewed and updated as appropriate: allergies, current medications, past family history, past medical history, past social history, past surgical history and problem list.  Review of Systems Pertinent items noted in HPI and remainder of comprehensive ROS otherwise negative.   Objective:    BP (!) 144/66   Pulse (!) 53   Temp 97.7 F (36.5 C) (Oral)   Ht 5\' 7"  (1.702 m)   Wt 169 lb (76.7 kg)   BMI 26.47 kg/m  General appearance: alert, cooperative and mild distress Eyes: conjunctivae/corneas clear. PERRL, EOM's intact. Fundi benign. Ears: normal TM's and external ear canals both ears Nose: clear discharge, moderate congestion, turbinates red, sinus tenderness bilateral Throat: lips, mucosa, and tongue normal; teeth and gums normal Neck: no adenopathy, no carotid bruit, no JVD, supple, symmetrical, trachea midline and thyroid not enlarged, symmetric, no tenderness/mass/nodules Lungs: clear to auscultation bilaterally Heart: regular rate and rhythm, S1, S2 normal, no murmur, click, rub or gallop    Assessment:    Acute bacterial sinusitis.    Plan:   1. Take meds as prescribed 2. Use a cool mist humidifier especially during the winter months and when heat has been humid. 3. Use saline nose sprays frequently 4. Saline irrigations of the nose can be very helpful if done frequently.  * 4X daily for 1 week*  * Use of a nettie pot can be helpful with this. Follow directions with this* 5. Drink plenty of fluids 6. Keep thermostat turn down low 7.For any cough or  congestion  Use plain Mucinex- regular strength or max strength is fine   * Children- consult with Pharmacist for dosing 8. For fever or aces or pains- take tylenol or ibuprofen appropriate for age and weight.  * for fevers greater than 101 orally you may alternate ibuprofen and tylenol every  3 hours.   Meds ordered this encounter  Medications  . azithromycin (ZITHROMAX) 250 MG tablet    Sig: Two tablets day one, then one tablet daily next 4 days.    Dispense:  6 tablet    Refill:  0    Order Specific Question:   Supervising Provider    Answer:   Eustaquio Maize [[id number]]   Mary-Margaret Hassell Done, FNP

## 2016-08-26 NOTE — Addendum Note (Signed)
Addended by: [name] on: 08/26/2016 10:23 AM   Modules accepted: Orders

## 2016-08-29 ENCOUNTER — Other Ambulatory Visit: Payer: Self-pay | Admitting: Nurse Practitioner

## 2016-09-15 ENCOUNTER — Ambulatory Visit (INDEPENDENT_AMBULATORY_CARE_PROVIDER_SITE_OTHER): Payer: Medicare Other | Admitting: Nurse Practitioner

## 2016-09-15 ENCOUNTER — Ambulatory Visit: Payer: Medicare Other | Admitting: Nurse Practitioner

## 2016-09-15 ENCOUNTER — Encounter: Payer: Self-pay | Admitting: Nurse Practitioner

## 2016-09-15 VITALS — BP 144/59 | HR 53 | Temp 97.8°F | Ht 67.0 in | Wt 173.0 lb

## 2016-09-15 DIAGNOSIS — E876 Hypokalemia: Secondary | ICD-10-CM

## 2016-09-15 DIAGNOSIS — Z6827 Body mass index (BMI) 27.0-27.9, adult: Secondary | ICD-10-CM | POA: Diagnosis not present

## 2016-09-15 DIAGNOSIS — N183 Chronic kidney disease, stage 3 unspecified: Secondary | ICD-10-CM

## 2016-09-15 DIAGNOSIS — R609 Edema, unspecified: Secondary | ICD-10-CM | POA: Diagnosis not present

## 2016-09-15 DIAGNOSIS — K5909 Other constipation: Secondary | ICD-10-CM

## 2016-09-15 DIAGNOSIS — E119 Type 2 diabetes mellitus without complications: Secondary | ICD-10-CM | POA: Diagnosis not present

## 2016-09-15 DIAGNOSIS — E785 Hyperlipidemia, unspecified: Secondary | ICD-10-CM | POA: Diagnosis not present

## 2016-09-15 DIAGNOSIS — R600 Localized edema: Secondary | ICD-10-CM

## 2016-09-15 DIAGNOSIS — E032 Hypothyroidism due to medicaments and other exogenous substances: Secondary | ICD-10-CM

## 2016-09-15 DIAGNOSIS — M8580 Other specified disorders of bone density and structure, unspecified site: Secondary | ICD-10-CM | POA: Diagnosis not present

## 2016-09-15 DIAGNOSIS — K219 Gastro-esophageal reflux disease without esophagitis: Secondary | ICD-10-CM | POA: Diagnosis not present

## 2016-09-15 DIAGNOSIS — I10 Essential (primary) hypertension: Secondary | ICD-10-CM | POA: Diagnosis not present

## 2016-09-15 LAB — BAYER DCA HB A1C WAIVED: HB A1C: 6.6 % (ref <7.0)

## 2016-09-15 MED ORDER — TRAMADOL HCL 50 MG PO TABS
50.0000 mg | ORAL_TABLET | Freq: Two times a day (BID) | ORAL | 1 refills | Status: DC | PRN
Start: 2016-09-15 — End: 2017-02-20

## 2016-09-15 NOTE — Progress Notes (Signed)
Subjective:    Patient ID: Katie [name], female    DOB: March 28, 1932, 83 y.o.   MRN: [id number]  Patient here today for follow up of chronic medical problems. She is doing well today without complaints. 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 She reports being burnt out.She denies being depressed. She was last seen on 08/26/16 with sinus infection- is doing much better.   Outpatient Encounter Prescriptions as of 09/15/2016  Medication Sig  . azithromycin (ZITHROMAX) 250 MG tablet Two tablets day one, then one tablet daily next 4 days.  . benazepril (LOTENSIN) 40 MG tablet Take 1 tablet (40 mg total) by mouth daily.  . calcium carbonate (OS-CAL) 600 MG TABS Take 600 mg by mouth daily.   . Cholecalciferol (VITAMIN D) 2000 UNITS CAPS Take 2,000 Units by mouth daily.   [name] etodolac (LODINE) 400 MG tablet TAKE 1 TABLET BY MOUTH TWO  TIMES DAILY  . furosemide (LASIX) 40 MG tablet Take one tablet (40mg ) by mouth in the morning daily.  Take 1/2 tablet (20mg ) by mouth in the afternoon daily.  [name] levothyroxine (SYNTHROID, LEVOTHROID) 50 MCG tablet Take 1 tablet (50 mcg total) by mouth daily before breakfast.  . lovastatin (MEVACOR) 40 MG tablet Take 2 tablets (80 mg total) by mouth at bedtime.  . metoprolol tartrate (LOPRESSOR) 25 MG tablet Take 1 tablet (25 mg total) by mouth 2 (two) times daily.  [name] omeprazole (PRILOSEC) 40 MG capsule Take 1 capsule (40 mg total) by mouth daily.  . potassium chloride SA (KLOR-CON M20) 20 MEQ tablet Take 1 tablet (20 mEq total) by mouth 2 (two) times daily.  . raloxifene (EVISTA) 60 MG tablet TAKE 1 TABLET BY MOUTH  DAILY FOR BONES  . Rivaroxaban (XARELTO) 15 MG TABS tablet Take 1 tablet (15 mg total) by mouth daily with supper.  . traMADol (ULTRAM) 50 MG tablet Take 1 tablet (50 mg total) by mouth 2 (two) times daily as needed for moderate pain or severe pain.   No facility-administered encounter medications on file as of 09/15/2016.      Hypertension  This is a chronic problem. The current episode started more than 1 year ago. The problem is unchanged. The problem is controlled. Pertinent negatives include no chest pain, headaches, neck pain, palpitations, peripheral edema or shortness of breath. Risk factors for coronary artery disease include sedentary lifestyle, post-menopausal state, dyslipidemia and diabetes mellitus. Past treatments include ACE inhibitors, calcium channel blockers and diuretics. The current treatment provides moderate improvement. There are no compliance problems.  Identifiable causes of hypertension include a thyroid problem.  Hyperlipidemia  This is a chronic problem. The current episode started more than 1 year ago. The problem is uncontrolled. Recent lipid tests were reviewed and are high. Exacerbating diseases include diabetes and hypothyroidism. She has no history of obesity. Pertinent negatives include no chest pain, myalgias or shortness of breath. Current antihyperlipidemic treatment includes statins. The current treatment provides moderate improvement of lipids. Compliance problems include adherence to diet and adherence to exercise.  Risk factors for coronary artery disease include dyslipidemia, family history, hypertension and post-menopausal.  Diabetes  She presents for her follow-up diabetic visit. She has type 2 diabetes mellitus. No MedicAlert identification noted. Her disease course has been stable. Pertinent negatives for hypoglycemia include no headaches. Pertinent negatives for diabetes include no chest pain, no visual change and no weakness. Symptoms are stable. Risk factors for coronary artery disease include diabetes mellitus, dyslipidemia, family  history, hypertension and post-menopausal. Current diabetic treatment includes diet. She is compliant with treatment most of the time. When asked about meal planning, she reported none. She has not had a previous visit with a dietitian. She rarely  participates in exercise. Home blood sugar record trend: does not chesk blood sugars very often. Her breakfast blood glucose is taken between 8-9 am. Her breakfast blood glucose range is generally 140-180 mg/dl. (Patient does not check daily blood sugars) An ACE inhibitor/angiotensin II receptor blocker is being taken. She does not see a podiatrist.Eye exam is not current.  Thyroid Problem  Presents for follow-up (hypothyroidism) visit. Patient reports no constipation, palpitations or visual change. The symptoms have been stable. Her past medical history is significant for diabetes and hyperlipidemia.  GERD Omeprazole keeps symptoms under control Hypokalemia Potassium supplements daily- no c/o lower extremity cramping Peripheral edema Lasix keeps under control- since decreased amlodopine to 5 mg daily has gotten much better Chronic back pain Flexeril, etodilac and ultram as needed- somedays she does not have to take at all. 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 Constipation  Patient currently not doing anything for constipation CKD stage III Currently just watching lab results  Review of Systems  Respiratory: Negative for shortness of breath.   Cardiovascular: Negative for chest pain and palpitations.  Gastrointestinal: Negative for constipation.  Musculoskeletal: Negative for myalgias and neck pain.  Neurological: Negative for weakness and headaches.  Hematological: Does not bruise/bleed easily.  Psychiatric/Behavioral: Negative.  Negative for suicidal ideas.  All other systems reviewed and are negative.      Objective:   Physical Exam  Constitutional: She is oriented to person, place, and time. She appears well-developed and well-nourished.  Hard of hearing  HENT:  Nose: Nose normal.  Mouth/Throat: Oropharynx is clear and moist.  Eyes: EOM are normal.  Neck: Trachea normal, normal range of motion and full passive range of motion without pain. Neck supple. No JVD  present. Carotid bruit is not present. No thyromegaly present.  Cardiovascular: Normal rate, normal heart sounds and intact distal pulses.  Exam reveals no gallop and no friction rub.   No murmur heard. Irregular rhythym  Pulmonary/Chest: Effort normal and breath sounds normal.  Abdominal: Soft. Bowel sounds are normal. She exhibits no distension and no mass. There is no tenderness.  Musculoskeletal: Normal range of motion. She exhibits no edema.  Lymphadenopathy:    She has no cervical adenopathy.  Neurological: She is alert and oriented to person, place, and time. She has normal reflexes.  Skin: Skin is warm and dry.  Psychiatric: She has a normal mood and affect. Her behavior is normal. Judgment and thought content normal.    BP (!) 144/59   Pulse (!) 53   Temp 97.8 F (36.6 C) (Oral)   Ht 5\' 7"  (1.702 m)   Wt 173 lb (78.5 kg)   BMI 27.10 kg/m     hgba1c is 6.6% dup from 6.5% last visit    Assessment & Plan:   1. Hyperlipidemia, unspecified hyperlipidemia type   2. Diabetes mellitus type 2, diet-controlled ([place])   3. Essential hypertension   4. Other constipation   5. Gastroesophageal reflux disease without esophagitis   6. Hypothyroidism due to non-medication exogenous substances   7. CKD (chronic kidney disease) stage 3, GFR 30-59 ml/min   8. BMI 27.0-27.9,adult   9. Hypokalemia   10. Peripheral edema    Continue all meds Labs pending Stress management discussed Follow up  in 3 months  [place], FNP

## 2016-09-15 NOTE — Patient Instructions (Signed)
Fall Prevention in the Home Introduction Falls can cause injuries. They can happen to people of all ages. There are many things you can do to make your home safe and to help prevent falls. What can I do on the outside of my home?  Regularly fix the edges of walkways and driveways and fix any cracks.  Remove anything that might make you trip as you walk through a door, such as a raised step or threshold.  Trim any bushes or trees on the path to your home.  Use bright outdoor lighting.  Clear any walking paths of anything that might make someone trip, such as rocks or tools.  Regularly check to see if handrails are loose or broken. Make sure that both sides of any steps have handrails.  Any raised decks and porches should have guardrails on the edges.  Have any leaves, snow, or ice cleared regularly.  Use sand or salt on walking paths during winter.  Clean up any spills in your garage right away. This includes oil or grease spills. What can I do in the bathroom?  Use night lights.  Install grab bars by the toilet and in the tub and shower. Do not use towel bars as grab bars.  Use non-skid mats or decals in the tub or shower.  If you need to sit down in the shower, use a plastic, non-slip stool.  Keep the floor dry. Clean up any water that spills on the floor as soon as it happens.  Remove soap buildup in the tub or shower regularly.  Attach bath mats securely with double-sided non-slip rug tape.  Do not have throw rugs and other things on the floor that can make you trip. What can I do in the bedroom?  Use night lights.  Make sure that you have a light by your bed that is easy to reach.  Do not use any sheets or blankets that are too big for your bed. They should not hang down onto the floor.  Have a firm chair that has side arms. You can use this for support while you get dressed.  Do not have throw rugs and other things on the floor that can make you trip. What can  I do in the kitchen?  Clean up any spills right away.  Avoid walking on wet floors.  Keep items that you use a lot in easy-to-reach places.  If you need to reach something above you, use a strong step stool that has a grab bar.  Keep electrical cords out of the way.  Do not use floor polish or wax that makes floors slippery. If you must use wax, use non-skid floor wax.  Do not have throw rugs and other things on the floor that can make you trip. What can I do with my stairs?  Do not leave any items on the stairs.  Make sure that there are handrails on both sides of the stairs and use them. Fix handrails that are broken or loose. Make sure that handrails are as long as the stairways.  Check any carpeting to make sure that it is firmly attached to the stairs. Fix any carpet that is loose or worn.  Avoid having throw rugs at the top or bottom of the stairs. If you do have throw rugs, attach them to the floor with carpet tape.  Make sure that you have a light switch at the top of the stairs and the bottom of the stairs. If you   do not have them, ask someone to add them for you. What else can I do to help prevent falls?  Wear shoes that:  Do not have high heels.  Have rubber bottoms.  Are comfortable and fit you well.  Are closed at the toe. Do not wear sandals.  If you use a stepladder:  Make sure that it is fully opened. Do not climb a closed stepladder.  Make sure that both sides of the stepladder are locked into place.  Ask someone to hold it for you, if possible.  Clearly mark and make sure that you can see:  Any grab bars or handrails.  First and last steps.  Where the edge of each step is.  Use tools that help you move around (mobility aids) if they are needed. These include:  Canes.  Walkers.  Scooters.  Crutches.  Turn on the lights when you go into a dark area. Replace any light bulbs as soon as they burn out.  Set up your furniture so you have a  clear path. Avoid moving your furniture around.  If any of your floors are uneven, fix them.  If there are any pets around you, be aware of where they are.  Review your medicines with your doctor. Some medicines can make you feel dizzy. This can increase your chance of falling. Ask your doctor what other things that you can do to help prevent falls. This information is not intended to replace advice given to you by your health care provider. Make sure you discuss any questions you have with your health care provider. Document Released: 05/31/2009 Document Revised: 01/10/2016 Document Reviewed: 09/08/2014  2017 Elsevier  

## 2016-09-16 LAB — LIPID PANEL
CHOL/HDL RATIO: 2.7 ratio (ref 0.0–4.4)
CHOLESTEROL TOTAL: 146 mg/dL (ref 100–199)
HDL: 55 mg/dL (ref >39)
LDL Calculated: 67 mg/dL (ref 0–99)
TRIGLYCERIDES: 122 mg/dL (ref 0–149)
VLDL Cholesterol Cal: 24 mg/dL (ref 5–40)

## 2016-09-16 LAB — CMP14+EGFR
ALT: 8 IU/L (ref 0–32)
AST: 14 IU/L (ref 0–40)
Albumin/Globulin Ratio: 1.6 (ref 1.2–2.2)
Albumin: 3.9 g/dL (ref 3.5–4.7)
Alkaline Phosphatase: 64 IU/L (ref 39–117)
BUN/Creatinine Ratio: 14 (ref 12–28)
BUN: 19 mg/dL (ref 8–27)
Bilirubin Total: 0.4 mg/dL (ref 0.0–1.2)
CALCIUM: 9.1 mg/dL (ref 8.7–10.3)
CO2: 23 mmol/L (ref 18–29)
Chloride: 102 mmol/L (ref 96–106)
Creatinine, Ser: 1.4 mg/dL — ABNORMAL HIGH (ref 0.57–1.00)
GFR calc Af Amer: 40 mL/min/{1.73_m2} — ABNORMAL LOW (ref >59)
GFR, EST NON AFRICAN AMERICAN: 35 mL/min/{1.73_m2} — AB (ref >59)
GLUCOSE: 107 mg/dL — AB (ref 65–99)
Globulin, Total: 2.5 g/dL (ref 1.5–4.5)
Potassium: 4.2 mmol/L (ref 3.5–5.2)
Sodium: 143 mmol/L (ref 134–144)
Total Protein: 6.4 g/dL (ref 6.0–8.5)

## 2016-09-18 NOTE — Progress Notes (Signed)
Chief Complaint  Patient presents with  . Follow-up    History of Present Illness: 83 yo female with history of HTN, HLD, DM, hypothyroidism and atrial fibrillation who is here today for follow up. I saw her as a new patient 06/06/15 for evaluation of PVCs. Echo 06/14/15 with normal LV function, moderate AI. 48 hour Holter monitor in 2016 with frequent PVCs, PACs. Her Norvasc was stopped and Cardizem was started. She was seen in primary care in May 2017 and found to have atrial fib/flutter. She was seen in our office June 2017 and was in sinus. She was started on Xarelto in our office. Echo July 2017 with normal LV systolic function. Lasix increased for LE edema.   She is here today for follow up. She is feeling well overall. She has rare palpitations with no near syncope, syncope. No chest pain or SOB. No LE edema on Lasix.   Primary Care Physician: [name], FNP   Past Medical History:  Diagnosis Date  . Aortic insufficiency    a. Trivial AI by echo 02/2016.  [name] Atrial fibrillation and flutter ([place])    a. Coarse afib vs flutter by EKG 12/2015.  . Cataract   . Chronic diastolic CHF (congestive heart failure) ([place])   . CKD (chronic kidney disease), stage III   . Edema   . GERD (gastroesophageal reflux disease)   . Hiatal hernia   . Hypercholesterolemia   . Hypertension   . Hypokalemia   . Hypothyroidism   . Left knee pain   . NIDDM (non-insulin dependent diabetes mellitus)    diet controlled   . Obesity   . Premature atrial contractions   . PVC's (premature ventricular contractions)   . URI (upper respiratory infection)   . Vitamin D deficiency     Past Surgical History:  Procedure Laterality Date  . APPENDECTOMY  1980  . BACK SURGERY    . CATARACT EXTRACTION, BILATERAL    . CHOLECYSTECTOMY  5/00  . COLONOSCOPY    . TONSILLECTOMY    . TOTAL ABDOMINAL HYSTERECTOMY W/ BILATERAL SALPINGOOPHORECTOMY  1980  . UPPER GASTROINTESTINAL ENDOSCOPY      Current  Outpatient Prescriptions  Medication Sig Dispense Refill  . benazepril (LOTENSIN) 40 MG tablet Take 1 tablet (40 mg total) by mouth daily. 90 tablet 1  . calcium carbonate (OS-CAL) 600 MG TABS Take 600 mg by mouth daily.     . Cholecalciferol (VITAMIN D) 2000 UNITS CAPS Take 2,000 Units by mouth daily.     [name] etodolac (LODINE) 400 MG tablet TAKE 1 TABLET BY MOUTH TWO  TIMES DAILY 180 tablet 0  . furosemide (LASIX) 40 MG tablet Take one tablet (40mg ) by mouth in the morning daily.  Take 1/2 tablet (20mg ) by mouth in the afternoon daily. 135 tablet 2  . levothyroxine (SYNTHROID, LEVOTHROID) 50 MCG tablet Take 1 tablet (50 mcg total) by mouth daily before breakfast. 90 tablet 1  . lovastatin (MEVACOR) 40 MG tablet Take 2 tablets (80 mg total) by mouth at bedtime. 180 tablet 1  . metoprolol tartrate (LOPRESSOR) 25 MG tablet Take 1 tablet (25 mg total) by mouth 2 (two) times daily. 180 tablet 1  . omeprazole (PRILOSEC) 40 MG capsule Take 1 capsule (40 mg total) by mouth daily. 90 capsule 1  . potassium chloride SA (KLOR-CON M20) 20 MEQ tablet Take 1 tablet (20 mEq total) by mouth 2 (two) times daily. 180 tablet 1  . raloxifene (EVISTA) 60 MG tablet TAKE  1 TABLET BY MOUTH  DAILY FOR BONES 90 tablet 1  . Rivaroxaban (XARELTO) 15 MG TABS tablet Take 1 tablet (15 mg total) by mouth daily with supper. 30 tablet 11  . traMADol (ULTRAM) 50 MG tablet Take 1 tablet (50 mg total) by mouth 2 (two) times daily as needed for moderate pain or severe pain. 60 tablet 1  . amLODipine (NORVASC) 5 MG tablet Take 1 tablet (5 mg total) by mouth daily. 30 tablet 11   No current facility-administered medications for this visit.     Allergies  Allergen Reactions  . Macrodantin Nausea And Vomiting  . Metformin And Related Nausea And Vomiting and Other (See Comments)    Bloating  . Penicillins Rash    Social History   Social History  . Marital status: Married    Spouse name: N/A  . Number of children: 2  . Years  of education: N/A   Occupational History  . Retired    Social History Main Topics  . Smoking status: Never Smoker  . Smokeless tobacco: Never Used  . Alcohol use No  . Drug use: No  . Sexual activity: No   Other Topics Concern  . Not on file   Social History Narrative  . No narrative on file    Family History  Problem Relation Age of Onset  . Diabetes Mother   . Stroke Mother   . Heart disease Father   . Uterine cancer Sister   . Diabetes Sister   . Ovarian cancer Sister   . Colon cancer Sister   . Diabetes Sister   . Liver cancer Sister     \  . Diabetes Brother   . Dementia Brother   . Atrial fibrillation Sister   . Diabetes Sister   . Heart attack Neg Hx   . Hypertension Neg Hx     Review of Systems:  As stated in the HPI and otherwise negative.   BP (!) 172/80 (BP Location: Left Arm)   Pulse 63   Ht 5\' 7"  (1.702 m)   Wt 171 lb (77.6 kg)   BMI 26.78 kg/m   Physical Examination: General: Well developed, well nourished, NAD  HEENT: OP clear, mucus membranes moist  SKIN: warm, dry. No rashes. Neuro: No focal deficits  Musculoskeletal: Muscle strength 5/5 all ext  Psychiatric: Mood and affect normal  Neck: No JVD, no carotid bruits, no thyromegaly, no lymphadenopathy.  Lungs:Clear bilaterally, no wheezes, rhonci, crackles Cardiovascular: Regular rate and rhythm. No murmurs, gallops or rubs. Abdomen:Soft. Bowel sounds present. Non-tender.  Extremities: No lower extremity edema. Pulses are 2 + in the bilateral DP/PT.  Echo 03/10/16: Left ventricle: The cavity size was normal. Systolic function was   normal. The estimated ejection fraction was in the range of 60%   to 65%. Doppler parameters are consistent with abnormal left   ventricular relaxation (grade 1 diastolic dysfunction). - Aortic valve: There was trivial regurgitation. - Mitral valve: There was mild regurgitation. - Pulmonary arteries: PA peak pressure: 33 mm Hg (S).  EKG:  EKG is not   ordered today. The EKG demonstrates   Recent Labs: 02/11/2016: Brain Natriuretic Peptide 223.3; Hemoglobin 11.8; Magnesium 1.9; Platelets 257; TSH 3.98 09/15/2016: ALT 8; BUN 19; Creatinine, Ser 1.40; Potassium 4.2; Sodium 143   Lipid Panel    Component Value Date/Time   CHOL 146 09/15/2016 1515   CHOL 158 01/04/2013 0905   TRIG 122 09/15/2016 1515   TRIG 125 10/18/2014 1435  TRIG 184 (H) 01/04/2013 0905   HDL 55 09/15/2016 1515   HDL 54 10/18/2014 1435   HDL 50 01/04/2013 0905   CHOLHDL 2.7 09/15/2016 1515   LDLCALC 67 09/15/2016 1515   LDLCALC 80 03/15/2014 1024   LDLCALC 71 01/04/2013 0905     Wt Readings from Last 3 Encounters:  09/19/16 171 lb (77.6 kg)  09/15/16 173 lb (78.5 kg)  08/26/16 169 lb (76.7 kg)     Other studies Reviewed: Additional studies/ records that were reviewed today include: . Review of the above records demonstrates:    Assessment and Plan:   1. Paroxysmal atrial fibrillation: She is in sinus today. Rare palpitations at home. Echo July 2017 with normal LV systolic function. Will continue Lopressor. Continue Xarelto.    2. Aortic valve insufficiency: Trivial by echo July 2017.    3. Chronic diastolic CHF: Volume status ok. Continue Lasix 40 mg am, 20 mg pm.   4. CKD, stage 3: Followed in primary care.   5. HTN: BP elevated today and on other recent visits in healthcare settings. Will add back Norvasc 5 mg daily. Continue Benazepril and Lopressor. She will follow BP closely at home and call if it remains elevated.   Current medicines are reviewed at length with the patient today.  The patient does not have concerns regarding medicines.  The following changes have been made:  no change  Labs/ tests ordered today include:   No orders of the defined types were placed in this encounter.   Disposition:   FU with me in 6 months.   Signed, Lauree Chandler, MD 09/19/2016 9:38 AM    [facility] [place],  [place], [place]  [address] Phone: [phone]; Fax: [phone]

## 2016-09-19 ENCOUNTER — Encounter: Payer: Self-pay | Admitting: Cardiovascular Disease

## 2016-09-19 ENCOUNTER — Ambulatory Visit (INDEPENDENT_AMBULATORY_CARE_PROVIDER_SITE_OTHER): Payer: Medicare Other | Admitting: Cardiovascular Disease

## 2016-09-19 VITALS — BP 172/80 | HR 63 | Ht 67.0 in | Wt 171.0 lb

## 2016-09-19 DIAGNOSIS — I10 Essential (primary) hypertension: Secondary | ICD-10-CM

## 2016-09-19 DIAGNOSIS — I5032 Chronic diastolic (congestive) heart failure: Secondary | ICD-10-CM

## 2016-09-19 DIAGNOSIS — I480 Paroxysmal atrial fibrillation: Secondary | ICD-10-CM | POA: Diagnosis not present

## 2016-09-19 DIAGNOSIS — I351 Nonrheumatic aortic (valve) insufficiency: Secondary | ICD-10-CM

## 2016-09-19 MED ORDER — AMLODIPINE BESYLATE 5 MG PO TABS: 5.0000 mg | ORAL_TABLET | Freq: Every day | ORAL | 11 refills | Status: DC

## 2016-09-19 NOTE — Patient Instructions (Signed)
Medication Instructions:  Your physician has recommended you make the following change in your medication:  Start amlodipine 5 mg by mouth daily.    Labwork: none  Testing/Procedures: none  Follow-Up: Your physician recommends that you schedule a follow-up appointment in: 6 months.  Please call our office in about 3 months to schedule this appointment.     Any Other Special Instructions Will Be Listed Below (If Applicable).     If you need a refill on your cardiac medications before your next appointment, please call your pharmacy.

## 2016-09-22 ENCOUNTER — Other Ambulatory Visit: Payer: Self-pay | Admitting: Nurse Practitioner

## 2016-09-22 DIAGNOSIS — E032 Hypothyroidism due to medicaments and other exogenous substances: Secondary | ICD-10-CM

## 2016-09-22 DIAGNOSIS — I10 Essential (primary) hypertension: Secondary | ICD-10-CM

## 2016-09-22 DIAGNOSIS — E876 Hypokalemia: Secondary | ICD-10-CM

## 2016-09-22 MED ORDER — POTASSIUM CHLORIDE CRYS ER 20 MEQ PO TBCR: 20.0000 meq | EXTENDED_RELEASE_TABLET | Freq: Two times a day (BID) | ORAL | 1 refills | Status: DC

## 2016-09-22 MED ORDER — RALOXIFENE HCL 60 MG PO TABS: ORAL_TABLET | ORAL | 1 refills | Status: DC

## 2016-09-22 MED ORDER — METOPROLOL TARTRATE 25 MG PO TABS: 25.0000 mg | ORAL_TABLET | Freq: Two times a day (BID) | ORAL | 1 refills | Status: DC

## 2016-09-22 NOTE — Telephone Encounter (Signed)
done

## 2016-09-24 ENCOUNTER — Telehealth: Payer: Self-pay | Admitting: *Deleted

## 2016-09-24 NOTE — Telephone Encounter (Signed)
Completed application faxed to [employer]

## 2016-10-06 ENCOUNTER — Telehealth: Payer: Self-pay | Admitting: Nurse Practitioner

## 2016-10-06 DIAGNOSIS — E782 Mixed hyperlipidemia: Secondary | ICD-10-CM

## 2016-10-06 DIAGNOSIS — E032 Hypothyroidism due to medicaments and other exogenous substances: Secondary | ICD-10-CM

## 2016-10-06 DIAGNOSIS — K219 Gastro-esophageal reflux disease without esophagitis: Secondary | ICD-10-CM

## 2016-10-06 DIAGNOSIS — R609 Edema, unspecified: Secondary | ICD-10-CM

## 2016-10-06 MED ORDER — LEVOTHYROXINE SODIUM 50 MCG PO TABS: 50.0000 ug | ORAL_TABLET | Freq: Every day | ORAL | 1 refills | Status: DC

## 2016-10-06 MED ORDER — OMEPRAZOLE 40 MG PO CPDR: 40.0000 mg | DELAYED_RELEASE_CAPSULE | Freq: Every day | ORAL | 1 refills | Status: DC

## 2016-10-06 MED ORDER — FUROSEMIDE 40 MG PO TABS: ORAL_TABLET | ORAL | 2 refills | Status: DC

## 2016-10-06 MED ORDER — LOVASTATIN 40 MG PO TABS: 80.0000 mg | ORAL_TABLET | Freq: Every day | ORAL | 1 refills | Status: DC

## 2016-10-06 NOTE — Telephone Encounter (Incomplete)
What symptoms do you have? ***  How long have you been sick? ***  Have you been seen for this problem? ***  If your provider decides to give you a prescription, which pharmacy would you like for it to be sent to? ***   Patient informed that this information will be sent to the clinical staff for review and that they should receive a follow up call.

## 2016-10-06 NOTE — Telephone Encounter (Signed)
Refills sent per protocol. Last OV 09/15/16

## 2016-10-09 ENCOUNTER — Telehealth: Payer: Self-pay | Admitting: *Deleted

## 2016-10-09 NOTE — Telephone Encounter (Signed)
Received fax from [facility] and [facility] stating pt has been denied for Xarelto assistance. I placed call to pt to discuss. Left message to call back

## 2016-10-10 ENCOUNTER — Telehealth: Payer: Self-pay | Admitting: Cardiovascular Disease

## 2016-10-10 NOTE — Telephone Encounter (Signed)
I spoke with pt and gave her information from Dr. McAlhany 

## 2016-10-10 NOTE — Telephone Encounter (Signed)
OK to stop Norvasc. I would suggest that she buy a BP cuff for home, follow BP for several weeks then let primary care know her readings for adjustment of BP meds. Gerald Stabs

## 2016-10-10 NOTE — Telephone Encounter (Signed)
I spoke with pt. Amlodipine was started at last office visit on 09/19/16. After taking for 3-4 days pt developed swelling in her ankles. She stopped amlodipine on 2/20 and swelling has improved.  She does not check BP at home (does not have a cuff)

## 2016-10-10 NOTE — Telephone Encounter (Signed)
New message   Pt c/o medication issue:  1. Name of Medication: amLODipine (NORVASC) 5 MG tablet  2. How are you currently taking this medication (dosage and times per day)? 5MG  1 time per day  3. Are you having a reaction (difficulty breathing--STAT)? NO  4. What is your medication issue? Pt says she stopped taking this medication. She states it makes her ankles swell and feet. She refuses to take this. Would like a call back about this.

## 2016-10-10 NOTE — Telephone Encounter (Signed)
I spoke with pt and told her about denial for assistance program.  Pt feels she will be able to afford Xarelto and will continue

## 2016-10-24 DIAGNOSIS — H5213 Myopia, bilateral: Secondary | ICD-10-CM | POA: Diagnosis not present

## 2016-10-24 DIAGNOSIS — H524 Presbyopia: Secondary | ICD-10-CM | POA: Diagnosis not present

## 2016-10-24 DIAGNOSIS — D3131 Benign neoplasm of right choroid: Secondary | ICD-10-CM | POA: Diagnosis not present

## 2016-10-24 DIAGNOSIS — H52221 Regular astigmatism, right eye: Secondary | ICD-10-CM | POA: Diagnosis not present

## 2016-11-12 ENCOUNTER — Other Ambulatory Visit: Payer: Self-pay | Admitting: Nurse Practitioner

## 2016-11-12 DIAGNOSIS — I10 Essential (primary) hypertension: Secondary | ICD-10-CM

## 2016-11-12 MED ORDER — BENAZEPRIL HCL 40 MG PO TABS: 40.0000 mg | ORAL_TABLET | Freq: Every day | ORAL | 1 refills | Status: DC

## 2016-11-12 NOTE — Telephone Encounter (Signed)
done

## 2016-11-12 NOTE — Telephone Encounter (Signed)
What is the name of the medication? Benazepril 40 mg  Have you contacted your pharmacy to request a refill? NO  Which pharmacy would you like this sent to? [facility].  Patient notified that their request is being sent to the clinical staff for review and that they should receive a call once it is complete. If they do not receive a call within 24 hours they can check with their pharmacy or our office.

## 2016-11-26 ENCOUNTER — Ambulatory Visit (INDEPENDENT_AMBULATORY_CARE_PROVIDER_SITE_OTHER): Payer: Medicare Other | Admitting: Specialist

## 2016-11-26 ENCOUNTER — Telehealth: Payer: Self-pay | Admitting: Nurse Practitioner

## 2016-11-26 DIAGNOSIS — I10 Essential (primary) hypertension: Secondary | ICD-10-CM

## 2016-11-26 MED ORDER — BENAZEPRIL HCL 40 MG PO TABS: 40.0000 mg | ORAL_TABLET | Freq: Every day | ORAL | 0 refills | Status: DC

## 2016-11-26 NOTE — Telephone Encounter (Signed)
done

## 2016-12-03 DIAGNOSIS — L570 Actinic keratosis: Secondary | ICD-10-CM | POA: Diagnosis not present

## 2016-12-03 DIAGNOSIS — Z85828 Personal history of other malignant neoplasm of skin: Secondary | ICD-10-CM | POA: Diagnosis not present

## 2016-12-03 DIAGNOSIS — L820 Inflamed seborrheic keratosis: Secondary | ICD-10-CM | POA: Diagnosis not present

## 2016-12-03 DIAGNOSIS — D225 Melanocytic nevi of trunk: Secondary | ICD-10-CM | POA: Diagnosis not present

## 2016-12-15 ENCOUNTER — Ambulatory Visit (INDEPENDENT_AMBULATORY_CARE_PROVIDER_SITE_OTHER): Payer: Medicare Other | Admitting: Nurse Practitioner

## 2016-12-15 ENCOUNTER — Encounter: Payer: Self-pay | Admitting: Nurse Practitioner

## 2016-12-15 VITALS — BP 159/64 | HR 55 | Temp 97.5°F | Ht 67.0 in | Wt 174.0 lb

## 2016-12-15 DIAGNOSIS — M8580 Other specified disorders of bone density and structure, unspecified site: Secondary | ICD-10-CM | POA: Diagnosis not present

## 2016-12-15 DIAGNOSIS — L247 Irritant contact dermatitis due to plants, except food: Secondary | ICD-10-CM

## 2016-12-15 DIAGNOSIS — E119 Type 2 diabetes mellitus without complications: Secondary | ICD-10-CM

## 2016-12-15 DIAGNOSIS — E876 Hypokalemia: Secondary | ICD-10-CM

## 2016-12-15 DIAGNOSIS — R609 Edema, unspecified: Secondary | ICD-10-CM | POA: Diagnosis not present

## 2016-12-15 DIAGNOSIS — E032 Hypothyroidism due to medicaments and other exogenous substances: Secondary | ICD-10-CM

## 2016-12-15 DIAGNOSIS — K219 Gastro-esophageal reflux disease without esophagitis: Secondary | ICD-10-CM

## 2016-12-15 DIAGNOSIS — E785 Hyperlipidemia, unspecified: Secondary | ICD-10-CM

## 2016-12-15 DIAGNOSIS — N183 Chronic kidney disease, stage 3 unspecified: Secondary | ICD-10-CM

## 2016-12-15 DIAGNOSIS — I10 Essential (primary) hypertension: Secondary | ICD-10-CM | POA: Diagnosis not present

## 2016-12-15 DIAGNOSIS — Z6827 Body mass index (BMI) 27.0-27.9, adult: Secondary | ICD-10-CM | POA: Diagnosis not present

## 2016-12-15 LAB — BAYER DCA HB A1C WAIVED: HB A1C: 6.7 % (ref <7.0)

## 2016-12-15 MED ORDER — BENAZEPRIL HCL 40 MG PO TABS: 40.0000 mg | ORAL_TABLET | Freq: Every day | ORAL | 1 refills | Status: DC

## 2016-12-15 MED ORDER — BENAZEPRIL HCL 40 MG PO TABS: 60.0000 mg | ORAL_TABLET | Freq: Every day | ORAL | 1 refills | Status: DC

## 2016-12-15 MED ORDER — RIVAROXABAN 15 MG PO TABS: 15.0000 mg | ORAL_TABLET | Freq: Every day | ORAL | 11 refills | Status: DC

## 2016-12-15 NOTE — Patient Instructions (Signed)
Poison Ivy Dermatitis Poison ivy dermatitis is redness and soreness (inflammation) of the skin. It is caused by a chemical that is found on the leaves of the poison ivy plant. You may also have itching, a rash, and blisters. Symptoms often clear up in 1-2 weeks. You may get this condition by touching a poison ivy plant. You can also get it by touching something that has the chemical on it. This may include animals or objects that have come in contact with the plant. Follow these instructions at home: General instructions   Take or apply over-the-counter and prescription medicines only as told by your doctor.  If you touch poison ivy, wash your skin with soap and cold water right away.  Use hydrocortisone creams or calamine lotion as needed to help with itching.  Take oatmeal baths as needed. Use colloidal oatmeal. You can get this at a pharmacy or grocery store. Follow the instructions on the package.  Do not scratch or rub your skin.  While you have the rash, wash your clothes right after you wear them. Prevention   Know what poison ivy looks like so you can avoid it. This plant has three leaves with flowering branches on a single stem. The leaves are glossy. They have uneven edges that come to a point at the front.  If you have touched poison ivy, wash with soap and water right away. Be sure to wash under your fingernails.  When hiking or camping, wear long pants, a long-sleeved shirt, tall socks, and hiking boots. You can also use a lotion on your skin that helps to prevent contact with the chemical on the plant.  If you think that your clothes or outdoor gear came in contact with poison ivy, rinse them off with a garden hose before you bring them inside your house. Contact a doctor if:  You have open sores in the rash area.  You have more redness, swelling, or pain in the affected area.  You have redness that spreads beyond the rash area.  You have fluid, blood, or pus coming  from the affected area.  You have a fever.  You have a rash over a large area of your body.  You have a rash on your eyes, mouth, or genitals.  Your rash does not get better after a few days. Get help right away if:  Your face swells or your eyes swell shut.  You have trouble breathing.  You have trouble swallowing. This information is not intended to replace advice given to you by your health care provider. Make sure you discuss any questions you have with your health care provider. Document Released: 09/06/2010 Document Revised: 01/10/2016 Document Reviewed: 01/10/2015 Elsevier Interactive Patient Education  2017 [employer].

## 2016-12-15 NOTE — Progress Notes (Signed)
Subjective:    Patient ID: Katie [name], female    DOB: 05/24/1932, 83 y.o.   MRN: [id number]  HPI  Katie [name] is here today for follow up of chronic medical problem.  Outpatient Encounter Prescriptions as of 12/15/2016  Medication Sig  . benazepril (LOTENSIN) 40 MG tablet Take 1 tablet (40 mg total) by mouth daily.  . calcium carbonate (OS-CAL) 600 MG TABS Take 600 mg by mouth daily.   . Cholecalciferol (VITAMIN D) 2000 UNITS CAPS Take 2,000 Units by mouth daily.   [name] etodolac (LODINE) 400 MG tablet TAKE 1 TABLET BY MOUTH TWO  TIMES DAILY  . furosemide (LASIX) 40 MG tablet Take one tablet (28m) by mouth in the morning daily.  Take 1/2 tablet (258m by mouth in the afternoon daily.  . [name] (SYNTHROID, LEVOTHROID) 50 MCG tablet Take 1 tablet (50 mcg total) by mouth daily before breakfast.  . lovastatin (MEVACOR) 40 MG tablet Take 2 tablets (80 mg total) by mouth at bedtime.  . metoprolol tartrate (LOPRESSOR) 25 MG tablet Take 1 tablet (25 mg total) by mouth 2 (two) times daily.  . [name] (PRILOSEC) 40 MG capsule Take 1 capsule (40 mg total) by mouth daily.  . potassium chloride SA (KLOR-CON M20) 20 MEQ tablet Take 1 tablet (20 mEq total) by mouth 2 (two) times daily.  . raloxifene (EVISTA) 60 MG tablet TAKE 1 TABLET BY MOUTH  DAILY FOR BONES  . Rivaroxaban (XARELTO) 15 MG TABS tablet Take 1 tablet (15 mg total) by mouth daily with supper.  . traMADol (ULTRAM) 50 MG tablet Take 1 tablet (50 mg total) by mouth 2 (two) times daily as needed for moderate pain or severe pain.   No facility-administered encounter medications on file as of 12/15/2016.     1. Essential hypertension   no c/o chest ain,SOB or HA- does not check blood pressure at home- cardiologist recently added amlodipine yt meds which cay=used swelling and she had to stop taking.  2. Gastroesophageal reflux disease without esophagitis  Omeprazole works well to keep symptoms under control  3. Diabetes mellitus  type 2, diet-controlled [place]  last HGBA1C was 6.6%- does not check blood sugars everyday but when does check fasting they are around 110-120  4. Hypothyroidism due to non-medication exogenous substances   no problems that she is aware of  5. Osteopenia, senile  No c/o back pain- does not do weight bearing exercise  6. CKD (chronic kidney disease) stage 3, GFR 30-59 ml/min   currently just watching labs  7. BMI 27.0-27.9,adult   no recent weight gain or weight loss  8. Hyperlipidemia, unspecified hyperlipidemia type  Tries to eat low fat- on lovaststin without complaint of muscle aches  9. Hypokalemia   no c/o lower ext cramoing  10. Peripheral edema  Not everyday- lasix works well to keep under control    New complaints: None today     Review of Systems  Constitutional: Negative for diaphoresis.  Eyes: Negative for pain.  Respiratory: Negative for shortness of breath.   Cardiovascular: Negative for chest pain, palpitations and leg swelling.  Gastrointestinal: Negative for abdominal pain.  Endocrine: Negative for polydipsia.  Skin: Positive for rash (on neck- popped up after doing yard work).  Neurological: Negative for dizziness, weakness and headaches.  Hematological: Does not bruise/bleed easily.       Objective:   Physical Exam  Constitutional: She is oriented to person, place, and time. She appears well-developed and well-nourished.  HENT:  Nose: Nose normal.  Mouth/Throat: Oropharynx is clear and moist.  Eyes: EOM are normal.  Neck: Trachea normal, normal range of motion and full passive range of motion without pain. Neck supple. No JVD present. Carotid bruit is not present. No thyromegaly present.  Cardiovascular: Normal rate, regular rhythm, normal heart sounds and intact distal pulses.  Exam reveals no gallop and no friction rub.   No murmur heard. Pulmonary/Chest: Effort normal and breath sounds normal.  Abdominal: Soft. Bowel sounds are normal. She exhibits  no distension and no mass. There is no tenderness.  Musculoskeletal: Normal range of motion.  Lymphadenopathy:    She has no cervical adenopathy.  Neurological: She is alert and oriented to person, place, and time. She has normal reflexes.  Skin: Skin is warm and dry. Rash (erythematous maculopapular patchy rash on left neck area) noted.  Psychiatric: She has a normal mood and affect. Her behavior is normal. Judgment and thought content normal.   BP (!) 159/64   Pulse (!) 55   Temp 97.5 F (36.4 C) (Oral)   Ht _0  (1.702 m)   Wt 174 lb (78.9 kg)   BMI 27.25 kg/m   hgba1c 6.7%    Assessment & Plan:  1. Essential hypertension Increase benazepril to 1 12 tablets daily - CMP14+EGFR - benazepril (LOTENSIN) 40 MG tablet; Take 1.5 tablets (60 mg total) by mouth daily.  Dispense: 180 tablet; Refill: 1  2. Gastroesophageal reflux disease without esophagitis Avoid spicy foods Do not eat 2 hours prior to bedtime  3. Diabetes mellitus type 2, diet-controlled ([place]) Watch carbs in diet - Bayer DCA Hb A1c Waived  4. Hypothyroidism due to non-medication exogenous substances - Thyroid Panel With TSH  5. Osteopenia, senile Weight bearing exercise encouraged  6. CKD (chronic kidney disease) stage 3, GFR 30-59 ml/min Will continue ti watch labs  7. BMI 27.0-27.9,adult Discussed diet and exercise for person with BMI >25 Will recheck weight in 3-6 months  8. Hyperlipidemia, unspecified hyperlipidemia type - Lipid panel  9. Hypokalemia  10. Peripheral edema Elevate legs when sitting  11. Gastroesophageal reflux disease, esophagitis presence not specified Avoid spicy foods Do not eat 2 hours prior to bedtime  12. Irritant contact dermatitis due to plants, except food Calamine lotion Try not to scratch RTO if spreading  Abs pending RTO in 3 months follow up

## 2016-12-16 LAB — THYROID PANEL WITH TSH
FREE THYROXINE INDEX: 1.6 (ref 1.2–4.9)
T3 UPTAKE RATIO: 24 % (ref 24–39)
T4 TOTAL: 6.7 ug/dL (ref 4.5–12.0)
TSH: 4.61 u[IU]/mL — ABNORMAL HIGH (ref 0.450–4.500)

## 2016-12-16 LAB — CMP14+EGFR
ALBUMIN: 4 g/dL (ref 3.5–4.7)
ALK PHOS: 67 IU/L (ref 39–117)
ALT: 12 IU/L (ref 0–32)
AST: 15 IU/L (ref 0–40)
Albumin/Globulin Ratio: 1.7 (ref 1.2–2.2)
BILIRUBIN TOTAL: 0.4 mg/dL (ref 0.0–1.2)
BUN / CREAT RATIO: 17 (ref 12–28)
BUN: 22 mg/dL (ref 8–27)
CHLORIDE: 101 mmol/L (ref 96–106)
CO2: 26 mmol/L (ref 18–29)
Calcium: 9 mg/dL (ref 8.7–10.3)
Creatinine, Ser: 1.31 mg/dL — ABNORMAL HIGH (ref 0.57–1.00)
GFR calc Af Amer: 43 mL/min/{1.73_m2} — ABNORMAL LOW (ref >59)
GFR calc non Af Amer: 38 mL/min/{1.73_m2} — ABNORMAL LOW (ref >59)
GLOBULIN, TOTAL: 2.4 g/dL (ref 1.5–4.5)
GLUCOSE: 143 mg/dL — AB (ref 65–99)
Potassium: 4.7 mmol/L (ref 3.5–5.2)
SODIUM: 141 mmol/L (ref 134–144)
Total Protein: 6.4 g/dL (ref 6.0–8.5)

## 2016-12-16 LAB — LIPID PANEL
CHOLESTEROL TOTAL: 141 mg/dL (ref 100–199)
Chol/HDL Ratio: 2.6 ratio (ref 0.0–4.4)
HDL: 54 mg/dL (ref >39)
LDL Calculated: 65 mg/dL (ref 0–99)
Triglycerides: 109 mg/dL (ref 0–149)
VLDL Cholesterol Cal: 22 mg/dL (ref 5–40)

## 2016-12-19 ENCOUNTER — Ambulatory Visit (INDEPENDENT_AMBULATORY_CARE_PROVIDER_SITE_OTHER): Payer: Medicare Other | Admitting: Specialist

## 2016-12-19 ENCOUNTER — Encounter (INDEPENDENT_AMBULATORY_CARE_PROVIDER_SITE_OTHER): Payer: Self-pay | Admitting: Specialist

## 2016-12-19 VITALS — BP 186/73 | HR 53 | Temp 97.2°F | Ht 67.0 in | Wt 172.0 lb

## 2016-12-19 DIAGNOSIS — M4726 Other spondylosis with radiculopathy, lumbar region: Secondary | ICD-10-CM | POA: Diagnosis not present

## 2016-12-19 DIAGNOSIS — M48062 Spinal stenosis, lumbar region with neurogenic claudication: Secondary | ICD-10-CM | POA: Diagnosis not present

## 2016-12-19 MED ORDER — METHYLPREDNISOLONE 4 MG PO TBPK: ORAL_TABLET | ORAL | 0 refills | Status: DC

## 2016-12-19 NOTE — Patient Instructions (Signed)
Avoid bending, stooping and avoid lifting weights greater than 10 lbs. Avoid prolong standing and walking. Avoid frequent bending and stooping  No lifting greater than 10 lbs. May use ice or moist heat for pain. Weight loss is of benefit. Handicap license is approvedbut she refuses one. Dr. Romona Curls secretary/Assistant will call to arrange for epidural steroid injection  Start medrol dose pak Tramadol for pain.

## 2016-12-19 NOTE — Addendum Note (Signed)
Addended by: Basil Dess on: 12/19/2016 10:24 AM   Modules accepted: Orders

## 2016-12-19 NOTE — Progress Notes (Signed)
Office Visit Note   Patient: Katie [name]           Date of Birth: 1932-01-25           MRN: [id number] Visit Date: 12/19/2016              Requested by: [name], [place], [place] [address] PCP: [name], FNP   Assessment & Plan: Visit Diagnoses:  1. Spinal stenosis of lumbar region with neurogenic claudication   2. Other spondylosis with radiculopathy, lumbar region     Plan: Avoid bending, stooping and avoid lifting weights greater than 10 lbs. Avoid prolong standing and walking. Avoid frequent bending and stooping  No lifting greater than 10 lbs. May use ice or moist heat for pain. Weight loss is of benefit. Handicap license is approvedbut she refuses one. Dr. Romona Curls secretary/Assistant will call to arrange for epidural steroid injection  Start medrol dose pak Tramadol for pain.   Follow-Up Instructions: Return in about 2 months (around 02/18/2017).   Orders:  No orders of the defined types were placed in this encounter.  Meds ordered this encounter  Medications  . methylPREDNISolone (MEDROL DOSEPAK) 4 MG TBPK tablet    Sig: Take as directed    Dispense:  21 tablet    Refill:  0      Procedures: No procedures performed   Clinical Data: No additional findings.   Subjective: Chief Complaint  Patient presents with  . Lower Back - Pain    Radiates to both hips and legs left worse then right. Last  Injection in Feb 2974    83 year old female with history of low back pain and neurogenic claudication symptoms into the right lower extremity into the right upper buttock and right lateral thigh. Worse with standing and walking, previously walked for exercise not not able to. Able to grocery shop okay with out handicap license. No bowel or bladder difficulties. Sleeps well, some weight Loss in the later part of last year. Takes tramadol up to BID. Requests a steriod dose pak and ESI. No falls. The pain is about the  same as last seen 09/2015.    Review of Systems  Constitutional: Negative.   HENT: Positive for sinus pain, sinus pressure and sneezing.   Eyes: Positive for redness and itching.  Respiratory: Negative.   Cardiovascular: Negative.   Gastrointestinal: Negative.   Endocrine: Negative.   Genitourinary: Negative.   Musculoskeletal: Negative.   Skin: Negative.   Allergic/Immunologic: Negative.   Neurological: Negative.   Hematological: Negative.   Psychiatric/Behavioral: Negative.      Objective: Vital Signs: BP (!) 186/73 (BP Location: Right Arm, Patient Position: Sitting, Cuff Size: Small)   Pulse (!) 53   Temp 97.2 F (36.2 C) (Oral)   Ht 5\' 7"  (1.702 m)   Wt 172 lb (78 kg)   BMI 26.94 kg/m   Physical Exam  Constitutional: She is oriented to person, place, and time. She appears well-developed and well-nourished.  HENT:  Head: Normocephalic and atraumatic.  Eyes: EOM are normal. Pupils are equal, round, and reactive to light.  Neck: Normal range of motion. Neck supple.  Pulmonary/Chest: Effort normal and breath sounds normal.  Abdominal: Soft. Bowel sounds are normal.  Neurological: She is alert and oriented to person, place, and time.  Skin: Skin is warm and dry.  Psychiatric: She has a normal mood and affect. Her behavior is normal. Judgment and thought content normal.    Back  Exam   Tenderness  The patient is experiencing tenderness in the lumbar.  Range of Motion  Extension: abnormal  Flexion: normal  Lateral Bend Right: abnormal  Lateral Bend Left: abnormal  Rotation Right: abnormal  Rotation Left: abnormal   Muscle Strength  Right Quadriceps:  4/5  Left Quadriceps:  5/5  Right Hamstrings:  5/5  Left Hamstrings:  5/5   Tests  Straight leg raise right: negative Straight leg raise left: negative  Reflexes  Patellar:  0/4 normal Achilles: 0/4 Babinski's sign: normal   Other  Toe Walk: normal Heel Walk: normal Sensation: normal Gait: normal    Erythema: no back redness Scars: absent      Specialty Comments:  No specialty comments available.  Imaging: No results found.   PMFS History: Patient Active Problem List   Diagnosis Date Noted  . Nasopharyngeal bleed 04/16/2016  . CKD (chronic kidney disease) stage 3, GFR 30-59 ml/min 05/14/2015  . BMI 27.0-27.9,adult 05/11/2015  . Peripheral edema 07/06/2014  . Hypokalemia 07/22/2013  . Hypothyroidism 07/22/2013  . Osteopenia, senile 03/09/2013  . Constipation 01/04/2013  . Hypertension 05/07/2012  . Hyperlipidemia 05/07/2012  . Diabetes mellitus type 2, diet-controlled ([place]) 05/07/2012  . GERD (gastroesophageal reflux disease) 05/07/2012   Past Medical History:  Diagnosis Date  . Aortic insufficiency    a. Trivial AI by echo 02/2016.  [name] Atrial fibrillation and flutter ([place])    a. Coarse afib vs flutter by EKG 12/2015.  . Cataract   . Chronic diastolic CHF (congestive heart failure) ([place])   . CKD (chronic kidney disease), stage III   . Edema   . GERD (gastroesophageal reflux disease)   . Hiatal hernia   . Hypercholesterolemia   . Hypertension   . Hypokalemia   . Hypothyroidism   . Left knee pain   . NIDDM (non-insulin dependent diabetes mellitus)    diet controlled   . Obesity   . Premature atrial contractions   . PVC's (premature ventricular contractions)   . URI (upper respiratory infection)   . Vitamin D deficiency     Family History  Problem Relation Age of Onset  . Diabetes Mother   . Stroke Mother   . Heart disease Father   . Uterine cancer Sister   . Diabetes Sister   . Ovarian cancer Sister   . Colon cancer Sister   . Diabetes Sister   . Liver cancer Sister     \  . Diabetes Brother   . Dementia Brother   . Atrial fibrillation Sister   . Diabetes Sister   . Heart attack Neg Hx   . Hypertension Neg Hx     Past Surgical History:  Procedure Laterality Date  . APPENDECTOMY  1980  . BACK SURGERY    . CATARACT EXTRACTION, BILATERAL     . CHOLECYSTECTOMY  5/00  . COLONOSCOPY    . TONSILLECTOMY    . TOTAL ABDOMINAL HYSTERECTOMY W/ BILATERAL SALPINGOOPHORECTOMY  1980  . UPPER GASTROINTESTINAL ENDOSCOPY     Social History   Occupational History  . Retired    Social History Main Topics  . Smoking status: Never Smoker  . Smokeless tobacco: Never Used  . Alcohol use No  . Drug use: No  . Sexual activity: No

## 2016-12-26 ENCOUNTER — Other Ambulatory Visit: Payer: Self-pay | Admitting: Nurse Practitioner

## 2016-12-26 DIAGNOSIS — E876 Hypokalemia: Secondary | ICD-10-CM

## 2016-12-26 DIAGNOSIS — K219 Gastro-esophageal reflux disease without esophagitis: Secondary | ICD-10-CM

## 2016-12-26 DIAGNOSIS — I10 Essential (primary) hypertension: Secondary | ICD-10-CM

## 2016-12-26 DIAGNOSIS — E032 Hypothyroidism due to medicaments and other exogenous substances: Secondary | ICD-10-CM

## 2016-12-26 DIAGNOSIS — E782 Mixed hyperlipidemia: Secondary | ICD-10-CM

## 2017-01-13 ENCOUNTER — Telehealth (INDEPENDENT_AMBULATORY_CARE_PROVIDER_SITE_OTHER): Payer: Self-pay | Admitting: Specialist

## 2017-01-13 ENCOUNTER — Other Ambulatory Visit (INDEPENDENT_AMBULATORY_CARE_PROVIDER_SITE_OTHER): Payer: Self-pay | Admitting: Specialist

## 2017-01-13 DIAGNOSIS — M48062 Spinal stenosis, lumbar region with neurogenic claudication: Secondary | ICD-10-CM

## 2017-01-13 NOTE — Telephone Encounter (Signed)
Dr. Romona Curls staff is needing order for injection with Dr. Ernestina Patches

## 2017-01-13 NOTE — Telephone Encounter (Signed)
ESI ordered. jen

## 2017-01-13 NOTE — Telephone Encounter (Signed)
Order has been placed.

## 2017-01-13 NOTE — Telephone Encounter (Signed)
Someone has tagged me into the message. This patient is not being seen by our providers.

## 2017-01-14 ENCOUNTER — Telehealth: Payer: Self-pay | Admitting: Pharmacist

## 2017-01-14 NOTE — Telephone Encounter (Signed)
Received clearance from [employer] that pt is having an upcoming ESI. Pt takes Xarelto for afib with CHADS2 score of 3 (HTN, DM, age). Ok to hold Xarelto for 3 days prior to procedure per protocol. Clearance faxed to [phone].

## 2017-01-19 ENCOUNTER — Telehealth (INDEPENDENT_AMBULATORY_CARE_PROVIDER_SITE_OTHER): Payer: Self-pay

## 2017-01-19 ENCOUNTER — Other Ambulatory Visit (INDEPENDENT_AMBULATORY_CARE_PROVIDER_SITE_OTHER): Payer: Self-pay | Admitting: Specialist

## 2017-01-19 DIAGNOSIS — M4726 Other spondylosis with radiculopathy, lumbar region: Secondary | ICD-10-CM

## 2017-01-19 DIAGNOSIS — M48062 Spinal stenosis, lumbar region with neurogenic claudication: Secondary | ICD-10-CM

## 2017-01-19 MED ORDER — METHYLPREDNISOLONE 4 MG PO TBPK: ORAL_TABLET | ORAL | 0 refills | Status: DC

## 2017-01-19 NOTE — Telephone Encounter (Signed)
I called and advised rx was sent to her pharmacy  

## 2017-01-19 NOTE — Progress Notes (Signed)
Patient is aware that her rx was sent into her pharm

## 2017-01-19 NOTE — Telephone Encounter (Signed)
Patient called stating that she is having left hip pain and can hardly walk.  She is scheduled to have an injection on her right side on 01/26/17 with Dr. Ernestina Patches.  She would like to know if Dr. Louanne Skye can call her in some steroids. CB# is [phone].  Please Advise.  Thank You.

## 2017-01-19 NOTE — Telephone Encounter (Signed)
Patient called stating that she is having left hip pain and can hardly walk.  She is scheduled to have an injection on her right side on 01/26/17 with Dr. Ernestina Patches.  She would like to know if Dr. Louanne Skye can call her in some steroids. ------- Please Advise

## 2017-01-19 NOTE — Telephone Encounter (Signed)
Rx for medrol dose pak sent to her pharmacy, [place], [place]. jen

## 2017-01-26 ENCOUNTER — Ambulatory Visit (INDEPENDENT_AMBULATORY_CARE_PROVIDER_SITE_OTHER): Payer: Medicare Other

## 2017-01-26 ENCOUNTER — Ambulatory Visit (INDEPENDENT_AMBULATORY_CARE_PROVIDER_SITE_OTHER): Payer: Medicare Other | Admitting: Physical Medicine and Rehabilitation

## 2017-01-26 ENCOUNTER — Encounter (INDEPENDENT_AMBULATORY_CARE_PROVIDER_SITE_OTHER): Payer: Self-pay | Admitting: Physical Medicine and Rehabilitation

## 2017-01-26 VITALS — BP 151/61 | HR 55 | Temp 98.4°F

## 2017-01-26 DIAGNOSIS — M5416 Radiculopathy, lumbar region: Secondary | ICD-10-CM | POA: Diagnosis not present

## 2017-01-26 DIAGNOSIS — M48062 Spinal stenosis, lumbar region with neurogenic claudication: Secondary | ICD-10-CM

## 2017-01-26 DIAGNOSIS — G894 Chronic pain syndrome: Secondary | ICD-10-CM

## 2017-01-26 MED ORDER — METHYLPREDNISOLONE ACETATE 80 MG/ML IJ SUSP
80.0000 mg | Freq: Once | INTRAMUSCULAR | Status: AC
  Administered 2017-01-26: 80 mg

## 2017-01-26 MED ORDER — LIDOCAINE HCL (PF) 1 % IJ SOLN
2.0000 mL | Freq: Once | INTRAMUSCULAR | Status: AC
  Administered 2017-01-26: 2 mL

## 2017-01-26 NOTE — Progress Notes (Deleted)
Pain across lower back. Pain was just on the right side when she made the appointment and now it is worse on left side for around 2 weeks. Sharp pains at times. Left side buttock pain into groin and down to knee.  Worse with walking and standing.

## 2017-01-26 NOTE — Procedures (Signed)
Lumbosacral Transforaminal Epidural Steroid Injection - Infraneural Approach with Fluoroscopic Guidance  Patient: Katie [name]      Date of Birth: 13-Jun-1932 MRN: [id number] PCP: [name], FNP      Visit Date: 01/26/2017   Katie [name] is an 83 year old female with prior history of lumbar spine problems recently saw Dr. Louanne Skye with right radicular complaints. She has a history of stenosis. She comes in today however and the pain has switched sides to the left. His posterior buttock and lateral hip into the anterior part of the thigh and knee more of an L3 and L4 distribution. We will complete a left L3 and L4 transforaminal epidural steroid injection. She'll follow-up with Dr. Louanne Skye. As of note on exam she has good distal strength and no pain with hip rotation left or right.  Universal Protocol:     Consent Given By: the patient  Position: PRONE   Additional Comments: Vital signs were monitored before and after the procedure. Patient was prepped and draped in the usual sterile fashion. The correct patient, procedure, and site was verified.   Injection Procedure Details:  Procedure Site One Meds Administered:  Meds ordered this encounter  Medications  . lidocaine (PF) (XYLOCAINE) 1 % injection 2 mL  . methylPREDNISolone acetate (DEPO-MEDROL) injection 80 mg      Laterality: Left  Location/Site:  L3-L4 L4-L5  Needle size: 22 G  Needle type: Spinal  Needle Placement: Transforaminal  Findings:  -Contrast Used: 1 mL iohexol 180 mg iodine/mL   -Comments: Excellent flow of contrast along the nerve and into the epidural space.  Procedure Details: After squaring off the end-plates of the desired vertebral level to get a true AP view, the C-arm was obliqued to the painful side so that the superior articulating process is positioned about 1/3 the length of the inferior endplate.  The needle was aimed toward the junction of the superior articular process and the  transverse process of the inferior vertebrae. The needle's initial entry is in the lower third of the foramen through Kambin's triangle. The soft tissues overlying this target were infiltrated with 2-3 ml. of 1% Lidocaine without Epinephrine.  The spinal needle was then inserted and advanced toward the target using a "trajectory" view along the fluoroscope beam.  Under AP and lateral visualization, the needle was advanced so it did not puncture dura and did not traverse medially beyond the 6 o'clock position of the pedicle. Bi-planar projections were used to confirm position. Aspiration was confirmed to be negative for CSF and/or blood. A 1-2 ml. volume of Isovue-250 was injected and flow of contrast was noted at each level. Radiographs were obtained for documentation purposes.   After attaining the desired flow of contrast documented above, a 0.5 to 1.0 ml test dose of 0.25% Marcaine was injected into each respective transforaminal space.  The patient was observed for 90 seconds post injection.  After no sensory deficits were reported, and normal lower extremity motor function was noted,   the above injectate was administered so that equal amounts of the injectate were placed at each foramen (level) into the transforaminal epidural space.   Additional Comments:  The patient tolerated the procedure well Dressing: Band-Aid    Post-procedure details: Patient was observed during the procedure. Post-procedure instructions were reviewed.  Patient left the clinic in stable condition.

## 2017-01-26 NOTE — Patient Instructions (Signed)

## 2017-02-13 ENCOUNTER — Other Ambulatory Visit: Payer: Self-pay | Admitting: Nurse Practitioner

## 2017-02-20 ENCOUNTER — Encounter (INDEPENDENT_AMBULATORY_CARE_PROVIDER_SITE_OTHER): Payer: Self-pay | Admitting: Specialist

## 2017-02-20 ENCOUNTER — Ambulatory Visit (INDEPENDENT_AMBULATORY_CARE_PROVIDER_SITE_OTHER): Payer: Medicare Other | Admitting: Specialist

## 2017-02-20 VITALS — BP 143/62 | HR 56 | Ht 67.0 in | Wt 172.0 lb

## 2017-02-20 DIAGNOSIS — M8580 Other specified disorders of bone density and structure, unspecified site: Secondary | ICD-10-CM | POA: Diagnosis not present

## 2017-02-20 DIAGNOSIS — M48062 Spinal stenosis, lumbar region with neurogenic claudication: Secondary | ICD-10-CM | POA: Diagnosis not present

## 2017-02-20 MED ORDER — TRAMADOL HCL 50 MG PO TABS: 50.0000 mg | ORAL_TABLET | Freq: Two times a day (BID) | ORAL | 1 refills | Status: DC | PRN

## 2017-02-20 MED ORDER — HYDROCODONE-ACETAMINOPHEN 5-325 MG PO TABS
1.0000 | ORAL_TABLET | Freq: Four times a day (QID) | ORAL | 0 refills | Status: DC | PRN
Start: 2017-02-20 — End: 2017-03-16

## 2017-02-20 NOTE — Progress Notes (Signed)
Office Visit Note   Patient: Katie [name]           Date of Birth: 1931-10-04           MRN: [id number] Visit Date: 02/20/2017              Requested by: [name], [address] [place], [place] [address] PCP: [name], FNP   Assessment & Plan: Visit Diagnoses:  1. Spinal stenosis of lumbar region with neurogenic claudication   2. Osteopenia, senile     Plan:Avoid bending, stooping and avoid lifting weights greater than 10 lbs. Avoid prolong standing and walking. Avoid frequent bending and stooping  No lifting greater than 10 lbs. May use ice or moist heat for pain. Weight loss is of benefit. Handicap license is approved. Take pain medications that are narcotic as NSAIDs are contraindicated by history of CHF and renal insufficiency.  Follow-Up Instructions: Return in about 2 months (around 04/23/2017).   Orders:  No orders of the defined types were placed in this encounter.  No orders of the defined types were placed in this encounter.     Procedures: No procedures performed   Clinical Data: No additional findings.   Subjective: Chief Complaint  Patient presents with  . Lower Back - Follow-up    Patient had Left L3-4, L4-5 Tf injection with Dr. Ernestina Patches on 01/26/17    83 year old female with left sided hip and thigh pain. It just stops at the left knee. She notices pain with standing and walking. Previous to her lumbar laminectomy in 2007 at [facility], the surgery is not in EPIC ESIs transforamenal L3-4 and L4-5 on the left were of benefit, she underwent a series of 3. 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     Review of Systems  Constitutional: Negative.   HENT: Negative.   Eyes: Negative.   Respiratory: Negative.     Cardiovascular: Negative.   Gastrointestinal: Negative.   Endocrine: Negative.   Genitourinary: Negative.   Musculoskeletal: Negative.   Skin: Negative.   Allergic/Immunologic: Negative.   Neurological: Negative.   Hematological: Negative.   Psychiatric/Behavioral: Negative.      Objective: Vital Signs: BP (!) 143/62 (BP Location: Left Arm, Patient Position: Sitting)   Pulse (!) 56   Ht 5\' 7"  (1.702 m)   Wt 172 lb (78 kg)   BMI 26.94 kg/m   Physical Exam  Back Exam   Tenderness  The patient is experiencing tenderness in the lumbar.  Range of Motion  Extension: abnormal  Flexion: normal  Lateral Bend Right: abnormal  Lateral Bend Left: abnormal  Rotation Right: abnormal  Rotation Left: abnormal   Muscle Strength  Right Quadriceps:  5/5  Left Quadriceps:  5/5  Right Hamstrings:  5/5  Left Hamstrings:  5/5   Tests  Straight leg raise right: negative Straight leg raise left: negative  Reflexes  Patellar: normal Achilles: normal Babinski's sign: normal   Other  Toe Walk: normal Heel Walk: normal Sensation: normal Gait: normal  Erythema: no back redness Scars: absent      Specialty Comments:  No specialty comments available.  Imaging: No results found.   PMFS History: Patient Active Problem List   Diagnosis Date Noted  . Nasopharyngeal bleed  04/16/2016  . CKD (chronic kidney disease) stage 3, GFR 30-59 ml/min 05/14/2015  . BMI 27.0-27.9,adult 05/11/2015  . Peripheral edema 07/06/2014  . Hypokalemia 07/22/2013  . Hypothyroidism 07/22/2013  . Osteopenia, senile 03/09/2013  . Constipation 01/04/2013  . Hypertension 05/07/2012  . Hyperlipidemia 05/07/2012  . Diabetes mellitus type 2, diet-controlled ([place]) 05/07/2012  . GERD (gastroesophageal reflux disease) 05/07/2012   Past Medical History:  Diagnosis Date  . Aortic insufficiency    a. Trivial AI by echo 02/2016.  [name] Atrial fibrillation and flutter ([place])    a. Coarse afib vs flutter by  EKG 12/2015.  . Cataract   . Chronic diastolic CHF (congestive heart failure) ([place])   . CKD (chronic kidney disease), stage III   . Edema   . GERD (gastroesophageal reflux disease)   . Hiatal hernia   . Hypercholesterolemia   . Hypertension   . Hypokalemia   . Hypothyroidism   . Left knee pain   . NIDDM (non-insulin dependent diabetes mellitus)    diet controlled   . Obesity   . Premature atrial contractions   . PVC's (premature ventricular contractions)   . URI (upper respiratory infection)   . Vitamin D deficiency     Family History  Problem Relation Age of Onset  . Diabetes Mother   . Stroke Mother   . Heart disease Father   . Uterine cancer Sister   . Diabetes Sister   . Ovarian cancer Sister   . Colon cancer Sister   . Diabetes Sister   . Liver cancer Sister        \  . Diabetes Brother   . Dementia Brother   . Atrial fibrillation Sister   . Diabetes Sister   . Heart attack Neg Hx   . Hypertension Neg Hx     Past Surgical History:  Procedure Laterality Date  . APPENDECTOMY  1980  . BACK SURGERY    . CATARACT EXTRACTION, BILATERAL    . CHOLECYSTECTOMY  5/00  . COLONOSCOPY    . TONSILLECTOMY    . TOTAL ABDOMINAL HYSTERECTOMY W/ BILATERAL SALPINGOOPHORECTOMY  1980  . UPPER GASTROINTESTINAL ENDOSCOPY     Social History   Occupational History  . Retired    Social History Main Topics  . Smoking status: Never Smoker  . Smokeless tobacco: Never Used  . Alcohol use No  . Drug use: No  . Sexual activity: No

## 2017-02-20 NOTE — Patient Instructions (Addendum)
Avoid bending, stooping and avoid lifting weights greater than 10 lbs. Avoid prolong standing and walking. Avoid frequent bending and stooping  No lifting greater than 10 lbs. May use ice or moist heat for pain. Weight loss is of benefit. Handicap license is approved. Take pain medications that are narcotic as NSAIDs are contraindicated by history of CHF and renal insufficiency.

## 2017-03-10 ENCOUNTER — Telehealth: Payer: Self-pay | Admitting: Nurse Practitioner

## 2017-03-10 NOTE — Telephone Encounter (Signed)
She does not know what caused the bleeding

## 2017-03-10 NOTE — Telephone Encounter (Signed)
Lets just wait and see if does it again

## 2017-03-10 NOTE — Telephone Encounter (Signed)
Does she know what could be casuing bleeding?

## 2017-03-10 NOTE — Telephone Encounter (Signed)
Returned patient's phone call.  Patient states that she woke up twice early this morning with blood in her mouth.  Around 4am and 6 am.  Patient would like to know if she needs to hold xarelto today

## 2017-03-10 NOTE — Telephone Encounter (Signed)
Patient aware of recommendations.  

## 2017-03-12 ENCOUNTER — Telehealth (INDEPENDENT_AMBULATORY_CARE_PROVIDER_SITE_OTHER): Payer: Self-pay

## 2017-03-12 NOTE — Telephone Encounter (Signed)
Becky  from [employer] in [place] called and is needing  a new handicap form to be mailed to patient. States Dr circled twice and it should ONLY be circled once on the options. Per Jacqlyn Larsen please fill out new form  correctly and mail to patient. This cannot be faxed.    Becky [phone]

## 2017-03-13 NOTE — Telephone Encounter (Signed)
Gave form to Dr. Louanne Skye to sign

## 2017-03-16 ENCOUNTER — Other Ambulatory Visit: Payer: Self-pay

## 2017-03-16 ENCOUNTER — Encounter: Payer: Self-pay | Admitting: Nurse Practitioner

## 2017-03-16 ENCOUNTER — Ambulatory Visit (INDEPENDENT_AMBULATORY_CARE_PROVIDER_SITE_OTHER): Payer: Medicare Other | Admitting: Nurse Practitioner

## 2017-03-16 VITALS — BP 145/58 | HR 54 | Temp 97.1°F | Ht 67.0 in | Wt 170.0 lb

## 2017-03-16 DIAGNOSIS — E785 Hyperlipidemia, unspecified: Secondary | ICD-10-CM

## 2017-03-16 DIAGNOSIS — Z6826 Body mass index (BMI) 26.0-26.9, adult: Secondary | ICD-10-CM | POA: Diagnosis not present

## 2017-03-16 DIAGNOSIS — I10 Essential (primary) hypertension: Secondary | ICD-10-CM | POA: Diagnosis not present

## 2017-03-16 DIAGNOSIS — E032 Hypothyroidism due to medicaments and other exogenous substances: Secondary | ICD-10-CM

## 2017-03-16 DIAGNOSIS — R609 Edema, unspecified: Secondary | ICD-10-CM

## 2017-03-16 DIAGNOSIS — K5909 Other constipation: Secondary | ICD-10-CM | POA: Diagnosis not present

## 2017-03-16 DIAGNOSIS — E119 Type 2 diabetes mellitus without complications: Secondary | ICD-10-CM

## 2017-03-16 DIAGNOSIS — E782 Mixed hyperlipidemia: Secondary | ICD-10-CM

## 2017-03-16 DIAGNOSIS — N183 Chronic kidney disease, stage 3 unspecified: Secondary | ICD-10-CM

## 2017-03-16 DIAGNOSIS — M8580 Other specified disorders of bone density and structure, unspecified site: Secondary | ICD-10-CM

## 2017-03-16 DIAGNOSIS — E875 Hyperkalemia: Secondary | ICD-10-CM | POA: Diagnosis not present

## 2017-03-16 DIAGNOSIS — K219 Gastro-esophageal reflux disease without esophagitis: Secondary | ICD-10-CM | POA: Diagnosis not present

## 2017-03-16 DIAGNOSIS — E876 Hypokalemia: Secondary | ICD-10-CM

## 2017-03-16 LAB — BAYER DCA HB A1C WAIVED: HB A1C (BAYER DCA - WAIVED): 7.3 % — ABNORMAL HIGH (ref <7.0)

## 2017-03-16 MED ORDER — LOVASTATIN 40 MG PO TABS: 80.0000 mg | ORAL_TABLET | Freq: Every day | ORAL | 1 refills | Status: DC

## 2017-03-16 MED ORDER — METOPROLOL TARTRATE 25 MG PO TABS: 25.0000 mg | ORAL_TABLET | Freq: Two times a day (BID) | ORAL | 1 refills | Status: DC

## 2017-03-16 MED ORDER — LEVOTHYROXINE SODIUM 50 MCG PO TABS: ORAL_TABLET | ORAL | 1 refills | Status: DC

## 2017-03-16 MED ORDER — OMEPRAZOLE 40 MG PO CPDR: 40.0000 mg | DELAYED_RELEASE_CAPSULE | Freq: Every day | ORAL | 1 refills | Status: DC

## 2017-03-16 MED ORDER — FUROSEMIDE 40 MG PO TABS: ORAL_TABLET | ORAL | 2 refills | Status: DC

## 2017-03-16 MED ORDER — RALOXIFENE HCL 60 MG PO TABS: 60.0000 mg | ORAL_TABLET | Freq: Every day | ORAL | 1 refills | Status: DC

## 2017-03-16 MED ORDER — POTASSIUM CHLORIDE CRYS ER 20 MEQ PO TBCR: 20.0000 meq | EXTENDED_RELEASE_TABLET | Freq: Two times a day (BID) | ORAL | 1 refills | Status: DC

## 2017-03-16 MED ORDER — BENAZEPRIL HCL 40 MG PO TABS: 60.0000 mg | ORAL_TABLET | Freq: Every day | ORAL | 1 refills | Status: DC

## 2017-03-16 MED ORDER — TRAMADOL HCL 50 MG PO TABS: 50.0000 mg | ORAL_TABLET | Freq: Two times a day (BID) | ORAL | 1 refills | Status: DC | PRN

## 2017-03-16 NOTE — Addendum Note (Signed)
Addended by: Rolena Infante on: 03/16/2017 11:14 AM   Modules accepted: Orders

## 2017-03-16 NOTE — Patient Instructions (Signed)

## 2017-03-16 NOTE — Addendum Note (Signed)
Addended by: Rolena Infante on: 03/16/2017 02:49 PM   Modules accepted: Orders

## 2017-03-16 NOTE — Progress Notes (Signed)
Subjective:    Patient ID: Katie [name], female    DOB: [birth year], 83 y.o.   MRN: [id number]  HPI  VENA BASSINGER is here today for follow up of chronic medical problem.  Outpatient Encounter Prescriptions as of 03/16/2017  Medication Sig  . benazepril (LOTENSIN) 40 MG tablet Take 1.5 tablets (60 mg total) by mouth daily.  . calcium carbonate (OS-CAL) 600 MG TABS Take 600 mg by mouth daily.   . Cholecalciferol (VITAMIN D) 2000 UNITS CAPS Take 2,000 Units by mouth daily.   [name] etodolac (LODINE) 400 MG tablet TAKE 1 TABLET BY MOUTH TWO  TIMES DAILY  . furosemide (LASIX) 40 MG tablet Take one tablet (65m) by mouth in the morning daily.  Take 1/2 tablet (294m by mouth in the afternoon daily.  . Marland KitchenYDROcodone-acetaminophen (NORCO/VICODIN) 5-325 MG tablet Take 1 tablet by mouth every 6 (six) hours as needed for moderate pain. If tramadol is not successful in relieving his pain.  . [name] (SYNTHROID, LEVOTHROID) 50 MCG tablet TAKE 1 TABLET BY MOUTH  DAILY BEFORE BREAKFAST  . lovastatin (MEVACOR) 40 MG tablet TAKE 2 TABLETS BY MOUTH AT  BEDTIME  . methylPREDNISolone (MEDROL DOSEPAK) 4 MG TBPK tablet Take as directed  . metoprolol tartrate (LOPRESSOR) 25 MG tablet TAKE 1 TABLET BY MOUTH TWO  TIMES DAILY  . omeprazole (PRILOSEC) 40 MG capsule TAKE 1 CAPSULE BY MOUTH  DAILY  . potassium chloride SA (K-DUR,KLOR-CON) 20 MEQ tablet TAKE 1 TABLET BY MOUTH TWO  TIMES DAILY  . raloxifene (EVISTA) 60 MG tablet TAKE 1 TABLET BY MOUTH  DAILY  . Rivaroxaban (XARELTO) 15 MG TABS tablet Take 1 tablet (15 mg total) by mouth daily with supper.  . traMADol (ULTRAM) 50 MG tablet Take 1 tablet (50 mg total) by mouth 2 (two) times daily as needed for moderate pain or severe pain.   No facility-administered encounter medications on file as of 03/16/2017.     1. Essential hypertension   no /o chest pain, SOB or headache. She does not check blood pressure at home.  2. Gastroesophageal reflux disease without  esophagitis  Takes omeprazole daily- has symptoms if she does not take  3. Other constipation  Just uses OTC meds when needed  4. Hypothyroidism due to non-medication exogenous substances  No problems that she is aware of  5. Diabetes mellitus type 2, diet-controlled (HCTate Last hgba1c was 6.7%. She does not check her blood sugars very often at home  6. CKD (chronic kidney disease) stage 3, GFR 30-59 ml/min  We are currently just watching labs right now  7. Peripheral edema  Does not have everyday- mainly when she is on her feet alot  8. Hypokalemia  No cramping to speak of  9. Hyperlipidemia, unspecified hyperlipidemia type  Tries to avoid fried foods  10. BMI 27.0-27.9,adult  Weight is down 6lbs  11. Osteopenia, senile  Has back pain daily- she does not do designated weight bearing exercise. SHe takes ultram or hydrocodone depending on severity of pain at any given time.     New complaints: None today  Social history:' SHe is the sole caregiver for her husband whom is very ill and cannot do anything for  Himself. SHe has 2 nieces that come in and help her get him in and out of bed.   Review of Systems  Constitutional: Negative for activity change and appetite change.  HENT: Negative.   Eyes: Negative for pain.  Respiratory: Negative for shortness of  breath.   Cardiovascular: Positive for leg swelling. Negative for chest pain and palpitations.  Gastrointestinal: Negative for abdominal pain.  Endocrine: Negative for polydipsia.  Genitourinary: Negative.   Musculoskeletal: Positive for back pain.  Skin: Negative for rash.  Neurological: Negative for dizziness, weakness and headaches.  Hematological: Does not bruise/bleed easily.  Psychiatric/Behavioral: Negative.   All other systems reviewed and are negative.      Objective:   Physical Exam  Constitutional: She is oriented to person, place, and time. She appears well-developed and well-nourished.  HENT:  Nose: Nose  normal.  Mouth/Throat: Oropharynx is clear and moist.  Eyes: EOM are normal.  Neck: Trachea normal, normal range of motion and full passive range of motion without pain. Neck supple. No JVD present. Carotid bruit is not present. No thyromegaly present.  Cardiovascular: Normal rate, regular rhythm, normal heart sounds and intact distal pulses.  Exam reveals no gallop and no friction rub.   No murmur heard. Pulmonary/Chest: Effort normal and breath sounds normal.  Abdominal: Soft. Bowel sounds are normal. She exhibits no distension and no mass. There is no tenderness.  Musculoskeletal: Normal range of motion. She exhibits edema (1+ edema bil lower ext).  Lymphadenopathy:    She has no cervical adenopathy.  Neurological: She is alert and oriented to person, place, and time. She has normal reflexes.  Skin: Skin is warm and dry.  Psychiatric: She has a normal mood and affect. Her behavior is normal. Judgment and thought content normal.    BP (!) 145/58   Pulse (!) 54   Temp (!) 97.1 F (36.2 C) (Oral)   Ht _0  (1.702 m)   Wt 170 lb (77.1 kg)   BMI 26.63 kg/m      Assessment & Plan:  1. Essential hypertension Low sodium diet - CMP14+EGFR - benazepril (LOTENSIN) 40 MG tablet; Take 1.5 tablets (60 mg total) by mouth daily.  Dispense: 180 tablet; Refill: 1 - metoprolol tartrate (LOPRESSOR) 25 MG tablet; Take 1 tablet (25 mg total) by mouth 2 (two) times daily.  Dispense: 180 tablet; Refill: 1 - CMP14+EGFR  2. Gastroesophageal reflux disease without esophagitis Avoid spicy foods Do not eat 2 hours prior to bedtime  3. Other constipation Increase fiber in diet  4. Hypothyroidism due to non-medication exogenous substances - Thyroid Panel With TSH - metoprolol tartrate (LOPRESSOR) 25 MG tablet; Take 1 tablet (25 mg total) by mouth 2 (two) times daily.  Dispense: 180 tablet; Refill: 1 - levothyroxine (SYNTHROID, LEVOTHROID) 50 MCG tablet; TAKE 1 TABLET BY MOUTH  DAILY BEFORE BREAKFAST   Dispense: 90 tablet; Refill: 1  5. Diabetes mellitus type 2, diet-controlled ([place]) Watch carbsin diet - Bayer DCA Hb A1c Waived - Microalbumin / creatinine urine ratio  6. CKD (chronic kidney disease) stage 3, GFR 30-59 ml/min Labs pending  7. Peripheral edema - furosemide (LASIX) 40 MG tablet; Take one tablet (34m) by mouth in the morning daily.  Take 1/2 tablet (217m by mouth in the afternoon daily.  Dispense: 135 tablet; Refill: 2  8. Hypokalemia - potassium chloride SA (K-DUR,KLOR-CON) 20 MEQ tablet; Take 1 tablet (20 mEq total) by mouth 2 (two) times daily.  Dispense: 180 tablet; Refill: 1  9. Hyperlipidemia, unspecified hyperlipidemia type Low fat diet - Lipid panel - Lipid panel- lovastatin (MEVACOR) 40 MG tablet; Take 2 tablets (80 mg total) by mouth at bedtime.  Dispense: 180 tablet; Refill: 1  10. BMI 26.0-26.9,adult Discussed diet and exercise for person with BMI >25 Will recheck  weight in 3-6 months  11. Osteopenia, senile Weight bearing exercises encouraged - traMADol (ULTRAM) 50 MG tablet; Take 1 tablet (50 mg total) by mouth 2 (two) times daily as needed for moderate pain or severe pain.  Dispense: 60 tablet; Refil1 - raloxifene (EVISTA) 60 MG tablet; Take 1 tablet (60 mg total) by mouth daily.  Dispense: 90 tablet; Refill: 1  12. Gastroesophageal reflux disease, esophagitis presence not specified Avoid spicy foods Do not eat 2 hours prior to bedtime - omeprazole (PRILOSEC) 40 MG capsule; Take 1 capsule (40 mg total) by mouth daily.  Dispense: 90 capsule; Refill: 1    Labs pending Health maintenance reviewed Diet and exercise encouraged Continue all meds Follow up  In 3 months   [place], FNP

## 2017-03-16 NOTE — Telephone Encounter (Signed)
Mailed to patient today

## 2017-03-17 LAB — CMP14+EGFR
A/G RATIO: 1.7 (ref 1.2–2.2)
ALBUMIN: 4 g/dL (ref 3.5–4.7)
ALK PHOS: 58 IU/L (ref 39–117)
ALT: 12 IU/L (ref 0–32)
AST: 15 IU/L (ref 0–40)
BILIRUBIN TOTAL: 0.4 mg/dL (ref 0.0–1.2)
BUN / CREAT RATIO: 16 (ref 12–28)
BUN: 18 mg/dL (ref 8–27)
CHLORIDE: 104 mmol/L (ref 96–106)
CO2: 25 mmol/L (ref 20–29)
Calcium: 9.1 mg/dL (ref 8.7–10.3)
Creatinine, Ser: 1.12 mg/dL — ABNORMAL HIGH (ref 0.57–1.00)
GFR calc Af Amer: 52 mL/min/{1.73_m2} — ABNORMAL LOW (ref >59)
GFR calc non Af Amer: 46 mL/min/{1.73_m2} — ABNORMAL LOW (ref >59)
Globulin, Total: 2.3 g/dL (ref 1.5–4.5)
Glucose: 128 mg/dL — ABNORMAL HIGH (ref 65–99)
POTASSIUM: 4.6 mmol/L (ref 3.5–5.2)
Sodium: 143 mmol/L (ref 134–144)
Total Protein: 6.3 g/dL (ref 6.0–8.5)

## 2017-03-17 LAB — THYROID PANEL WITH TSH
FREE THYROXINE INDEX: 1.7 (ref 1.2–4.9)
T3 UPTAKE RATIO: 23 % — AB (ref 24–39)
T4 TOTAL: 7.3 ug/dL (ref 4.5–12.0)
TSH: 3.25 u[IU]/mL (ref 0.450–4.500)

## 2017-03-17 LAB — LIPID PANEL
CHOLESTEROL TOTAL: 134 mg/dL (ref 100–199)
Chol/HDL Ratio: 2.6 ratio (ref 0.0–4.4)
HDL: 51 mg/dL (ref >39)
LDL Calculated: 60 mg/dL (ref 0–99)
Triglycerides: 114 mg/dL (ref 0–149)
VLDL CHOLESTEROL CAL: 23 mg/dL (ref 5–40)

## 2017-03-18 ENCOUNTER — Other Ambulatory Visit: Payer: Self-pay | Admitting: Nurse Practitioner

## 2017-03-18 ENCOUNTER — Telehealth: Payer: Self-pay | Admitting: Nurse Practitioner

## 2017-03-18 MED ORDER — GLIMEPIRIDE 2 MG PO TABS: 2.0000 mg | ORAL_TABLET | Freq: Every day | ORAL | 5 refills | Status: DC

## 2017-03-19 ENCOUNTER — Telehealth: Payer: Self-pay | Admitting: *Deleted

## 2017-03-19 NOTE — Telephone Encounter (Signed)
Spoke with nurses aid helping to care for husband. She advised that patient was gone to get her hair done. Advised aid to have patient call office at her earliest convenience. Aid verbalized understanding

## 2017-03-19 NOTE — Telephone Encounter (Signed)
Pt notified of results Verbalizes understanding Pt does not want to take diabetes med at this time Stressed importance of med Pt states she will wait until she returns for 3 mth chk up to discuss with MMM

## 2017-03-20 NOTE — Telephone Encounter (Signed)
Please let patient know that she is diabetic and really needs to be on meds- strict low carb diet

## 2017-03-21 NOTE — Telephone Encounter (Signed)
Patient states that she is eating a lot of sweets. I discussed the importance of taking the medication and what can happen without taking the medication. Patient states she is still going to wait 3 months and watch her diet.

## 2017-03-21 NOTE — Telephone Encounter (Signed)
Ok- nothing more I can do.

## 2017-03-24 ENCOUNTER — Telehealth: Payer: Self-pay | Admitting: Nurse Practitioner

## 2017-03-24 NOTE — Telephone Encounter (Signed)
Spoke to pt and advised the rx was sent to [facility] 03/18/17 so she just needs to call [facility] and have them fill it. Pt voiced understanding.

## 2017-03-30 ENCOUNTER — Encounter: Payer: Self-pay | Admitting: *Deleted

## 2017-04-02 ENCOUNTER — Ambulatory Visit: Payer: Self-pay | Admitting: Pharmacist

## 2017-04-06 ENCOUNTER — Ambulatory Visit (INDEPENDENT_AMBULATORY_CARE_PROVIDER_SITE_OTHER): Payer: Medicare Other | Admitting: *Deleted

## 2017-04-06 ENCOUNTER — Encounter: Payer: Self-pay | Admitting: *Deleted

## 2017-04-06 VITALS — BP 161/62 | HR 54 | Ht 67.0 in | Wt 170.0 lb

## 2017-04-06 DIAGNOSIS — Z0000 Encounter for general adult medical examination without abnormal findings: Secondary | ICD-10-CM

## 2017-04-06 NOTE — Progress Notes (Signed)
Subjective:   Katie [name] is a 83 y.o. female who presents for a subsequent Medicare Annual Wellness Visit. Katie [name] lives at home with her husband. She is his primary caregiver and he has been in Hospice care since Jan 2018. Katie [name] has 2 daughters and she has 2 nieces that help her move her husband out of the bed in the morning and then helps him back to bed in the evening. She does not participate in an exercise program right now but was going to the gym up until a year ago. She stays active around her home though. She does not have any hobbies and doesn't leave her house often. When she leaves she has to arrange for someone to sit with her husband.   Review of Systems    Health is a little worse than last year due to increased back pain.   Musculoskeletal: Back pain. Sees Dr Louanne Skye. Last head steroid injection in June and has a follow up in September. Pain increased this past week. Also taking Lodine, but only taking 1 a day due to cost.   Cardiac Risk Factors include: advanced age (>60men, >80 women);diabetes mellitus;dyslipidemia;hypertension;sedentary lifestyle  Endocrine: Started Amaryl recently. Had 3 doses and woke up with symptoms of hypoglycemia (excessive sweating, nausea, confusion, "shaky"). CBG was 60. Has stopped Amaryl.   Other systems negative today.    Objective:    Today's Vitals   04/06/17 1037  BP: (!) 161/62  Pulse: (!) 54  Weight: 170 lb (77.1 kg)  Height: 5\' 7"  (1.702 m)   Body mass index is 26.63 kg/m.   Current Medications (verified) Outpatient Encounter Prescriptions as of 04/06/2017  Medication Sig  . benazepril (LOTENSIN) 40 MG tablet Take 1.5 tablets (60 mg total) by mouth daily.  . calcium carbonate (OS-CAL) 600 MG TABS Take 600 mg by mouth daily.   . Cholecalciferol (VITAMIN D) 2000 UNITS CAPS Take 2,000 Units by mouth daily.   [name] etodolac (LODINE) 400 MG tablet TAKE 1 TABLET BY MOUTH TWO  TIMES DAILY  . furosemide (LASIX) 40 MG tablet Take  one tablet (40mg ) by mouth in the morning daily.  Take 1/2 tablet (20mg ) by mouth in the afternoon daily.  [name] levothyroxine (SYNTHROID, LEVOTHROID) 50 MCG tablet TAKE 1 TABLET BY MOUTH  DAILY BEFORE BREAKFAST  . lovastatin (MEVACOR) 40 MG tablet Take 2 tablets (80 mg total) by mouth at bedtime.  . metoprolol tartrate (LOPRESSOR) 25 MG tablet Take 1 tablet (25 mg total) by mouth 2 (two) times daily.  [name] omeprazole (PRILOSEC) 40 MG capsule Take 1 capsule (40 mg total) by mouth daily.  . potassium chloride SA (K-DUR,KLOR-CON) 20 MEQ tablet Take 1 tablet (20 mEq total) by mouth 2 (two) times daily.  . raloxifene (EVISTA) 60 MG tablet Take 1 tablet (60 mg total) by mouth daily.  . Rivaroxaban (XARELTO) 15 MG TABS tablet Take 1 tablet (15 mg total) by mouth daily with supper.  . traMADol (ULTRAM) 50 MG tablet Take 1 tablet (50 mg total) by mouth 2 (two) times daily as needed for moderate pain or severe pain.  [name] glimepiride (AMARYL) 2 MG tablet Take 1 tablet (2 mg total) by mouth daily with breakfast. (Patient not taking: Reported on 04/06/2017)   No facility-administered encounter medications on file as of 04/06/2017.     Allergies (verified) Amlodipine; Macrodantin; Metformin and related; and Penicillins   History: Past Medical History:  Diagnosis Date  . Aortic insufficiency    a. Trivial  AI by echo 02/2016.  [name] Atrial fibrillation and flutter ([place])    a. Coarse afib vs flutter by EKG 12/2015.  . Cataract   . Chronic diastolic CHF (congestive heart failure) ([place])   . CKD (chronic kidney disease), stage III   . Edema   . GERD (gastroesophageal reflux disease)   . Hiatal hernia   . Hypercholesterolemia   . Hypertension   . Hypokalemia   . Hypothyroidism   . Left knee pain   . NIDDM (non-insulin dependent diabetes mellitus)    diet controlled   . Obesity   . Premature atrial contractions   . PVC's (premature ventricular contractions)   . URI (upper respiratory infection)   . Vitamin D  deficiency    Past Surgical History:  Procedure Laterality Date  . APPENDECTOMY  1980  . BACK SURGERY    . CATARACT EXTRACTION, BILATERAL    . CHOLECYSTECTOMY  5/00  . COLONOSCOPY    . TONSILLECTOMY    . TOTAL ABDOMINAL HYSTERECTOMY W/ BILATERAL SALPINGOOPHORECTOMY  1980  . UPPER GASTROINTESTINAL ENDOSCOPY     Family History  Problem Relation Age of Onset  . Diabetes Mother   . Stroke Mother   . Heart disease Father   . Uterine cancer Sister   . Diabetes Sister   . Ovarian cancer Sister   . Colon cancer Sister   . Diabetes Sister   . Liver cancer Sister        \  . Diabetes Brother   . Dementia Brother   . Atrial fibrillation Sister   . Diabetes Sister   . Diabetes Son   . Heart attack Neg Hx   . Hypertension Neg Hx    Social History   Occupational History  . Retired    Social History Main Topics  . Smoking status: Never Smoker  . Smokeless tobacco: Never Used  . Alcohol use No  . Drug use: No  . Sexual activity: No    Tobacco Counseling No tobacco use  Activities of Daily Living In your present state of health, do you have any difficulty performing the following activities: 04/06/2017  Hearing? Y  Comment Has not been evaluated. Will let us know if she decides to  Vision? N  Difficulty concentrating or making decisions? N  Walking or climbing stairs? N  Dressing or bathing? N  Doing errands, shopping? N  Preparing Food and eating ? N  Using the Toilet? N  In the past six months, have you accidently leaked urine? N  Do you have problems with loss of bowel control? N  Managing your Medications? N  Managing your Finances? N  Housekeeping or managing your Housekeeping? N  Some recent data might be hidden    Immunizations and Health Maintenance Immunization History  Administered Date(s) Administered  . Influenza,inj,Quad PF,36+ Mos 05/09/2013, 05/18/2014, 04/17/2016  . Pneumococcal Conjugate-13 02/07/2015  . Pneumococcal Polysaccharide-23 06/30/2012    Health Maintenance Due  Topic Date Due  . OPHTHALMOLOGY EXAM  01/07/2016  . INFLUENZA VACCINE  03/18/2017    Patient Care Team: [name], FNP as PCP - General (Nurse Practitioner) [name], MD as Consulting Physician (Cardiology) Kathrene Alu (Physician Assistant) Jessy Oto, MD as Consulting Physician (Orthopedic Surgery) Melina Schools, OD (Optometry)  No hospitalizations, ER visits, or surgeries this past year.     Assessment:   This is a routine wellness examination for Kayti.   Hearing/Vision screen No hearing or vision deficits noted during  visit. Last eye exam was this past spring.   Dietary issues and exercise activities discussed: Current Exercise Habits: The patient does not participate in regular exercise at present (Patient was going to the gym up until a year ago and she had to stop to take care of her husband. No current exercise routine. ), Exercise limited by: None identified  Diet: Two meals a day and a light lunch  Goals    . Exercise 150 minutes per week (moderate activity)      Depression Screen PHQ 2/9 Scores 04/06/2017 03/16/2017 12/15/2016 09/15/2016 08/26/2016 06/10/2016 04/14/2016  PHQ - 2 Score 0 0 0 0 0 0 0  PHQ- 9 Score - - - - - - -    Fall Risk Fall Risk  04/06/2017 03/16/2017 12/15/2016 09/15/2016 08/26/2016  Falls in the past year? No No No No No  Number falls in past yr: - - - - -  Risk for fall due to : - - - - -  Risk for fall due to: Comment - - - - -    Cognitive Function: MMSE - Mini Mental State Exam 04/06/2017 03/26/2016  Orientation to time 4 5  Orientation to Place 5 5  Registration 3 3  Attention/ Calculation 5 5  Recall 2 2  Language- name 2 objects 2 2  Language- repeat 1 0  Language- follow 3 step command 3 3  Language- read & follow direction 1 1  Write a sentence 1 1  Copy design 1 0  Total score 28 27    Normal exam    Screening Tests Health Maintenance  Topic Date Due  .  OPHTHALMOLOGY EXAM  01/07/2016  . INFLUENZA VACCINE  03/18/2017  . COLONOSCOPY  05/28/2017  . MAMMOGRAM  06/10/2017  . DEXA SCAN  08/13/2017  . HEMOGLOBIN A1C  09/16/2017  . FOOT EXAM  03/16/2018  . TETANUS/TDAP  11/30/2021  . PNA vac Low Risk Adult  Completed     Plan:  Mammogram scheduled for 07/17/17 on mobile unit Dexa due 07/2017 Colonoscopy due 05/2017 (if necessary) Chair exercises daily-handout given and reviewed Avoid caregiver strain Eye exam report requested from [facility] f/u with [name], [place] on 06/16/17 Will consult with Shelah Lewandowsky about Amaryl. Contact Dr Louanne Skye and let hiim know that back pain is worsening   I have personally reviewed and noted the following in the patient's chart:   . Medical and social history . Use of alcohol, tobacco or illicit drugs  . Current medications and supplements . Functional ability and status . Nutritional status . Physical activity . Advanced directives . List of other physicians . Hospitalizations, surgeries, and ER visits in previous 12 months . Vitals . Screenings to include cognitive, depression, and falls . Referrals and appointments  In addition, I have reviewed and discussed with patient certain preventive protocols, quality metrics, and best practice recommendations. A written personalized care plan for preventive services as well as general preventive health recommendations were provided to patient.     Chong Sicilian, RN   04/06/2017   I have reviewed and agree with the above AWV documentation.   Mary-Margaret Hassell Done, FNP    /

## 2017-04-06 NOTE — Patient Instructions (Signed)
  Katie [name] , Thank you for taking time to come for your Medicare Wellness Visit. I appreciate your ongoing commitment to your health goals. Please review the following plan we discussed and let me know if I can assist you in the future.   These are the goals we discussed: Goals    . Exercise 150 minutes per week (moderate activity)      Do chair exercises daily. See handout.   This is a list of the screening recommended for you and due dates:  Health Maintenance  Topic Date Due  . Eye exam for diabetics  01/07/2016  . Flu Shot  03/18/2017  . Colon Cancer Screening  05/28/2017  . Mammogram  06/10/2017  . DEXA scan (bone density measurement)  08/13/2017  . Hemoglobin A1C  09/16/2017  . Complete foot exam   03/16/2018  . Tetanus Vaccine  11/30/2021  . Pneumonia vaccines  Completed   Mammogram 07/17/17 at 2:30 on the mobile unit You will need a Dexa scan in 07/2017

## 2017-04-24 ENCOUNTER — Ambulatory Visit (INDEPENDENT_AMBULATORY_CARE_PROVIDER_SITE_OTHER): Payer: Medicare Other | Admitting: Specialist

## 2017-04-27 ENCOUNTER — Encounter: Payer: Self-pay | Admitting: Physician Assistant

## 2017-04-27 ENCOUNTER — Ambulatory Visit (INDEPENDENT_AMBULATORY_CARE_PROVIDER_SITE_OTHER): Payer: Medicare Other | Admitting: Physician Assistant

## 2017-04-27 VITALS — BP 142/62 | HR 52 | Ht 67.0 in | Wt 169.0 lb

## 2017-04-27 DIAGNOSIS — I480 Paroxysmal atrial fibrillation: Secondary | ICD-10-CM | POA: Diagnosis not present

## 2017-04-27 DIAGNOSIS — E785 Hyperlipidemia, unspecified: Secondary | ICD-10-CM

## 2017-04-27 DIAGNOSIS — E119 Type 2 diabetes mellitus without complications: Secondary | ICD-10-CM | POA: Diagnosis not present

## 2017-04-27 DIAGNOSIS — I5032 Chronic diastolic (congestive) heart failure: Secondary | ICD-10-CM

## 2017-04-27 DIAGNOSIS — I10 Essential (primary) hypertension: Secondary | ICD-10-CM

## 2017-04-27 DIAGNOSIS — I4819 Other persistent atrial fibrillation: Secondary | ICD-10-CM | POA: Insufficient documentation

## 2017-04-27 NOTE — Patient Instructions (Signed)
Medication Instructions:   Your physician recommends that you continue on your current medications as directed. Please refer to the Current Medication list given to you today.   If you need a refill on your cardiac medications before your next appointment, please call your pharmacy.  Labwork: NONE ORDERED  TODAY    Testing/Procedures: NONE ORDERED  TODAY    Follow-Up:  Your physician wants you to follow-up in:  IN  6  MONTHS WITH DR MCALHANY   You will receive a reminder letter in the mail two months in advance. If you don't receive a letter, please call our office to schedule the follow-up appointment.     Any Other Special Instructions Will Be Listed Below (If Applicable).                                                                                                                                                   

## 2017-04-27 NOTE — Progress Notes (Signed)
Cardiology Office Note    Date:  04/27/2017   ID:  Katie [name], DOB January 18, 1932, MRN [id number]  PCP:  [name], FNP  Cardiologist: Dr. Angelena Form  Chief Complaint  Patient presents with  . Follow-up    History of Present Illness:  Katie [name] is a 83 y.o. female with history of HTN, HLD, DM, hypothyroidism and atrial fibrillation with CHADSVASC=3 on Xarelto, trivial AI, chronic diastolic CHF, CK D stage III. Last 2-D echo July 9629 normal LV systolic function. Lasix was increased for lower extremity edema. Last saw Dr.McAlhany 09/2016 and blood pressure was up that day. She called back saying her blood pressures were low so her Norvasc was stopped.Labs reviewed from 03/16/17 creatinine 1.12 cholesterol 134 LDL 60 HDL 51 triglycerides 114, thyroid studies normal, hemoglobin A1c elevated at 7.3.  Patient comes in today for regular f/u. Friday she had a stomach bug with abdominal pain and diarrhea. She then developed racing and skipping heart. It lasted most of the day Friday and scared her. Saturday and Sunday she felt like it was back to normal but she was she hasn't had this in the long time. She denies any extra caffeine use or missed medications. She said she stopped the Amaryl because it dropped her blood sugars too low. She is not on any other medication. I told her she needs to follow-up with primary care for this.   Past Medical History:  Diagnosis Date  . Aortic insufficiency    a. Trivial AI by echo 02/2016.  [name] Atrial fibrillation and flutter ([place])    a. Coarse afib vs flutter by EKG 12/2015.  . Cataract   . Chronic diastolic CHF (congestive heart failure) ([place])   . CKD (chronic kidney disease), stage III   . Edema   . GERD (gastroesophageal reflux disease)   . Hiatal hernia   . Hypercholesterolemia   . Hypertension   . Hypokalemia   . Hypothyroidism   . Left knee pain   . NIDDM (non-insulin dependent diabetes mellitus)    diet controlled   . Obesity   .  Premature atrial contractions   . PVC's (premature ventricular contractions)   . URI (upper respiratory infection)   . Vitamin D deficiency     Past Surgical History:  Procedure Laterality Date  . APPENDECTOMY  1980  . BACK SURGERY    . CATARACT EXTRACTION, BILATERAL    . CHOLECYSTECTOMY  5/00  . COLONOSCOPY    . TONSILLECTOMY    . TOTAL ABDOMINAL HYSTERECTOMY W/ BILATERAL SALPINGOOPHORECTOMY  1980  . UPPER GASTROINTESTINAL ENDOSCOPY      Current Medications: Current Meds  Medication Sig  . benazepril (LOTENSIN) 40 MG tablet Take 1.5 tablets (60 mg total) by mouth daily.  . calcium carbonate (OS-CAL) 600 MG TABS Take 600 mg by mouth daily.   . Cholecalciferol (VITAMIN D) 2000 UNITS CAPS Take 2,000 Units by mouth daily.   [name] etodolac (LODINE) 400 MG tablet Take 400 mg by mouth daily.  . furosemide (LASIX) 40 MG tablet Take one tablet (40mg ) by mouth in the morning daily.  Take 1/2 tablet (20mg ) by mouth in the afternoon daily.  [name] levothyroxine (SYNTHROID, LEVOTHROID) 50 MCG tablet TAKE 1 TABLET BY MOUTH  DAILY BEFORE BREAKFAST  . lovastatin (MEVACOR) 40 MG tablet Take 2 tablets (80 mg total) by mouth at bedtime.  . metoprolol tartrate (LOPRESSOR) 25 MG tablet Take 1 tablet (25 mg total) by mouth 2 (two) times daily.  [name] omeprazole (  PRILOSEC) 40 MG capsule Take 1 capsule (40 mg total) by mouth daily.  . potassium chloride SA (K-DUR,KLOR-CON) 20 MEQ tablet Take 1 tablet (20 mEq total) by mouth 2 (two) times daily.  . raloxifene (EVISTA) 60 MG tablet Take 1 tablet (60 mg total) by mouth daily.  . Rivaroxaban (XARELTO) 15 MG TABS tablet Take 1 tablet (15 mg total) by mouth daily with supper.  . traMADol (ULTRAM) 50 MG tablet Take 1 tablet (50 mg total) by mouth 2 (two) times daily as needed for moderate pain or severe pain.     Allergies:   Amlodipine; Macrodantin; Metformin and related; and Penicillins   Social History   Social History  . Marital status: Married    Spouse name:  N/A  . Number of children: 2  . Years of education: N/A   Occupational History  . Retired    Social History Main Topics  . Smoking status: Never Smoker  . Smokeless tobacco: Never Used  . Alcohol use No  . Drug use: No  . Sexual activity: No   Other Topics Concern  . None   Social History Narrative  . None     Family History:  The patient's family history includes Atrial fibrillation in her sister; Colon cancer in her sister; Dementia in her brother; Diabetes in her brother, mother, sister, sister, sister, and son; Heart disease in her father; Liver cancer in her sister; Ovarian cancer in her sister; Stroke in her mother; Uterine cancer in her sister.   ROS:   Please see the history of present illness.    Review of Systems  Constitution: Negative.  HENT: Positive for hearing loss.   Eyes: Negative.   Cardiovascular: Positive for irregular heartbeat and palpitations.  Respiratory: Negative.   Hematologic/Lymphatic: Bruises/bleeds easily.  Musculoskeletal: Positive for back pain. Negative for joint pain.  Gastrointestinal: Negative.   Genitourinary: Negative.   Neurological: Negative.    All other systems reviewed and are negative.   PHYSICAL EXAM:   VS:  BP (!) 142/62   Pulse (!) 52   Ht 5\' 7"  (1.702 m)   Wt 169 lb (76.7 kg)   BMI 26.47 kg/m   Physical Exam  GEN: Well nourished, well developed, in no acute distress  Neck: no JVD, carotid bruits, or masses Cardiac:RRR; no murmurs, rubs, or gallops  Respiratory:  clear to auscultation bilaterally, normal work of breathing GI: soft, nontender, nondistended, + BS Ext: without cyanosis, clubbing, or edema, Good distal pulses bilaterally Neuro:  Alert and Oriented x 3 Psych: euthymic mood, full affect  Wt Readings from Last 3 Encounters:  04/27/17 169 lb (76.7 kg)  04/06/17 170 lb (77.1 kg)  03/16/17 170 lb (77.1 kg)      Studies/Labs Reviewed:   EKG:  EKG is  ordered today.  The ekg ordered today  demonstrates Sinus bradycardia 52 bpm, no acute change  Recent Labs: 03/16/2017: ALT 12; BUN 18; Creatinine, Ser 1.12; Potassium 4.6; Sodium 143; TSH 3.250   Lipid Panel    Component Value Date/Time   CHOL 134 03/16/2017 0000   CHOL 158 01/04/2013 0905   TRIG 114 03/16/2017 0000   TRIG 125 10/18/2014 1435   TRIG 184 (H) 01/04/2013 0905   HDL 51 03/16/2017 0000   HDL 54 10/18/2014 1435   HDL 50 01/04/2013 0905   CHOLHDL 2.6 03/16/2017 0000   LDLCALC 60 03/16/2017 0000   LDLCALC 80 03/15/2014 1024   LDLCALC 71 01/04/2013 0905    Additional studies/  records that were reviewed today include:    Echo 03/10/16: Left ventricle: The cavity size was normal. Systolic function was   normal. The estimated ejection fraction was in the range of 60%   to 65%. Doppler parameters are consistent with abnormal left   ventricular relaxation (grade 1 diastolic dysfunction). - Aortic valve: There was trivial regurgitation. - Mitral valve: There was mild regurgitation. - Pulmonary arteries: PA peak pressure: 33 mm Hg (S).     ASSESSMENT:    1. Paroxysmal atrial fibrillation (HCC)   2. Chronic diastolic CHF (congestive heart failure) ([place])   3. Essential hypertension   4. Diabetes mellitus type 2, diet-controlled ([place])   5. Hyperlipidemia, unspecified hyperlipidemia type      PLAN:  In order of problems listed above:  Paroxysmal atrial fibrillation in normal sinus rhythm today with sinus bradycardia 52 bpm. Sounds like she may been in atrial fibrillation Friday when she was having abdominal pain and diarrhea.This was the first time in a long time and scared her. Labs were normal including TSH in July. Continue Xarelto and metoprolol at current dose. I told her if it happens again she could take an extra 25 mg of metoprolol. This happens more frequently she will have to come back sooner. Follow-up with Dr.McAlhany in 6 months.  Chronic diastolic CHF compensated  Essential hypertension  controlled  Diabetes mellitus type 2 with hemoglobin A1c of 7.3 in July. Didn't tolerate Amaryl. Recommend follow-up with primary care for further treatment.  Hyperlipidemia on Mevacor with excellent profile in July.    Medication Adjustments/Labs and Tests Ordered: Current medicines are reviewed at length with the patient today.  Concerns regarding medicines are outlined above.  Medication changes, Labs and Tests ordered today are listed in the Patient Instructions below. Patient Instructions  Medication Instructions:    Your physician recommends that you continue on your current medications as directed. Please refer to the Current Medication list given to you today.'   If you need a refill on your cardiac medications before your next appointment, please call your pharmacy.  Labwork: NONE ORDERED  TODAY    Testing/Procedures: NONE ORDERED  TODAY    Follow-Up:  Your physician wants you to follow-up in:  IN  [place] will receive a reminder letter in the mail two months in advance. If you don't receive a letter, please call our office to schedule the follow-up appointment.      Any Other Special Instructions Will Be Listed Below (If Applicable).                                                                                                                                                      Sumner Boast, PA-C  04/27/2017 2:44 PM    [facility]  [address] [place], [place]  [address] Phone: [phone]; Fax: [phone]

## 2017-06-05 ENCOUNTER — Ambulatory Visit (INDEPENDENT_AMBULATORY_CARE_PROVIDER_SITE_OTHER): Payer: Medicare Other | Admitting: Specialist

## 2017-06-09 ENCOUNTER — Telehealth: Payer: Self-pay | Admitting: Nurse Practitioner

## 2017-06-16 ENCOUNTER — Ambulatory Visit (INDEPENDENT_AMBULATORY_CARE_PROVIDER_SITE_OTHER): Payer: Medicare Other | Admitting: Nurse Practitioner

## 2017-06-16 ENCOUNTER — Encounter: Payer: Self-pay | Admitting: Nurse Practitioner

## 2017-06-16 VITALS — BP 142/54 | HR 56 | Temp 98.5°F | Ht 67.0 in | Wt 171.0 lb

## 2017-06-16 DIAGNOSIS — N183 Chronic kidney disease, stage 3 unspecified: Secondary | ICD-10-CM

## 2017-06-16 DIAGNOSIS — R609 Edema, unspecified: Secondary | ICD-10-CM

## 2017-06-16 DIAGNOSIS — I5032 Chronic diastolic (congestive) heart failure: Secondary | ICD-10-CM

## 2017-06-16 DIAGNOSIS — E119 Type 2 diabetes mellitus without complications: Secondary | ICD-10-CM | POA: Diagnosis not present

## 2017-06-16 DIAGNOSIS — I10 Essential (primary) hypertension: Secondary | ICD-10-CM | POA: Diagnosis not present

## 2017-06-16 DIAGNOSIS — E782 Mixed hyperlipidemia: Secondary | ICD-10-CM | POA: Diagnosis not present

## 2017-06-16 DIAGNOSIS — Z6826 Body mass index (BMI) 26.0-26.9, adult: Secondary | ICD-10-CM | POA: Diagnosis not present

## 2017-06-16 DIAGNOSIS — K219 Gastro-esophageal reflux disease without esophagitis: Secondary | ICD-10-CM

## 2017-06-16 DIAGNOSIS — R600 Localized edema: Secondary | ICD-10-CM

## 2017-06-16 DIAGNOSIS — I481 Persistent atrial fibrillation: Secondary | ICD-10-CM

## 2017-06-16 DIAGNOSIS — E032 Hypothyroidism due to medicaments and other exogenous substances: Secondary | ICD-10-CM

## 2017-06-16 DIAGNOSIS — E785 Hyperlipidemia, unspecified: Secondary | ICD-10-CM

## 2017-06-16 DIAGNOSIS — M8580 Other specified disorders of bone density and structure, unspecified site: Secondary | ICD-10-CM | POA: Diagnosis not present

## 2017-06-16 DIAGNOSIS — I4819 Other persistent atrial fibrillation: Secondary | ICD-10-CM

## 2017-06-16 LAB — BAYER DCA HB A1C WAIVED: HB A1C (BAYER DCA - WAIVED): 6.6 % (ref <7.0)

## 2017-06-16 MED ORDER — LOVASTATIN 40 MG PO TABS: 80.0000 mg | ORAL_TABLET | Freq: Every day | ORAL | 1 refills | Status: DC

## 2017-06-16 MED ORDER — TRAMADOL HCL 50 MG PO TABS: 50.0000 mg | ORAL_TABLET | Freq: Two times a day (BID) | ORAL | 1 refills | Status: DC | PRN

## 2017-06-16 MED ORDER — OMEPRAZOLE 40 MG PO CPDR: 40.0000 mg | DELAYED_RELEASE_CAPSULE | Freq: Every day | ORAL | 1 refills | Status: DC

## 2017-06-16 MED ORDER — METOPROLOL TARTRATE 25 MG PO TABS
25.0000 mg | ORAL_TABLET | Freq: Two times a day (BID) | ORAL | 1 refills | Status: DC
Start: 2017-06-16 — End: 2017-09-22

## 2017-06-16 NOTE — Progress Notes (Signed)
Subjective:    Patient ID: Katie [name], female    DOB: 1932/03/14, 83 y.o.   MRN: [id number]  HPI Katie [name] is here today for follow up of chronic medical problem.  Outpatient Encounter Prescriptions as of 06/16/2017  Medication Sig  . benazepril (LOTENSIN) 40 MG tablet Take 1.5 tablets (60 mg total) by mouth daily.  . calcium carbonate (OS-CAL) 600 MG TABS Take 600 mg by mouth daily.   . Cholecalciferol (VITAMIN D) 2000 UNITS CAPS Take 2,000 Units by mouth daily.   [name] etodolac (LODINE) 400 MG tablet Take 400 mg by mouth daily.  . furosemide (LASIX) 40 MG tablet Take one tablet (33m) by mouth in the morning daily.  Take 1/2 tablet (267m by mouth in the afternoon daily.  . [name] (SYNTHROID, LEVOTHROID) 50 MCG tablet TAKE 1 TABLET BY MOUTH  DAILY BEFORE BREAKFAST  . lovastatin (MEVACOR) 40 MG tablet Take 2 tablets (80 mg total) by mouth at bedtime.  . metoprolol tartrate (LOPRESSOR) 25 MG tablet Take 1 tablet (25 mg total) by mouth 2 (two) times daily.  . [name] (PRILOSEC) 40 MG capsule Take 1 capsule (40 mg total) by mouth daily.  . potassium chloride SA (K-DUR,KLOR-CON) 20 MEQ tablet Take 1 tablet (20 mEq total) by mouth 2 (two) times daily.  . raloxifene (EVISTA) 60 MG tablet Take 1 tablet (60 mg total) by mouth daily.  . Rivaroxaban (XARELTO) 15 MG TABS tablet Take 1 tablet (15 mg total) by mouth daily with supper.  . traMADol (ULTRAM) 50 MG tablet Take 1 tablet (50 mg total) by mouth 2 (two) times daily as needed for moderate pain or severe pain.   No facility-administered encounter medications on file as of 06/16/2017.     1. Essential hypertension  Blood pressure well-controlled with benazepril and metoprolol.  Patient does not check BP at home.  2. Gastroesophageal reflux disease without esophagitis  Symptoms managed with omeprazole.  No concerns.  3. Hypothyroidism due to non-medication exogenous substances  Patient taking levothyroxine.  Labs monitored  regularly.  4. Diabetes mellitus type 2, diet-controlled ([place] Blood glucose controlled with diet only.  Previous A1C 7.3% on 03/16/17.  Patient does not check blood glucose regularly. We sent in rx for glimipiride but patient only took for 3 days and she said it made her sick.  5. CKD (chronic kidney disease) stage 3, GFR 30-59 ml/min (HCC)  Monitoring labs at this time.    6. Hyperlipidemia, unspecified hyperlipidemia type  Managed with lovastatin.  LFTs monitored regularly.  7. Persistent atrial fibrillation ([facility] Followed regularly by cardiologist.  Taking Xarelto for thrombus prophylaxis.  8. Chronic diastolic CHF (congestive heart failure) ([place] Followed regularly by cardiology.  9. Peripheral edema  Daily Lasix to manage.  Elevates legs when seated.  10. BMI 26.0-26.9,adult  No significant weight gain or loss.    New complaints: Not strictly watching diet and was unable to take glimeperide previously prescribed d/t upsetting her stomach.  Only able to take for 3 days before stopping.  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    Review of Systems  Constitutional: Negative for activity change and appetite change.  Respiratory: Negative for cough and shortness of breath.   Cardiovascular: Negative for chest pain and palpitations.  Neurological: Negative for dizziness, light-headedness and headaches.  All other systems reviewed and are negative.      Objective:   Physical Exam  Constitutional: She is oriented to person, place,  and time. She appears well-developed and well-nourished. No distress.  HENT:  Head: Normocephalic.  Right Ear: External ear normal.  Left Ear: External ear normal.  Mouth/Throat: Oropharynx is clear and moist.  Eyes: Pupils are equal, round, and reactive to light.  Neck: Normal range of motion. Neck supple. No thyromegaly present.  Cardiovascular: Normal rate, regular rhythm, normal heart sounds and intact distal pulses.   No  murmur heard. Pulmonary/Chest: Effort normal and breath sounds normal. No respiratory distress.  Abdominal: Soft. Bowel sounds are normal.  Musculoskeletal: Normal range of motion. She exhibits edema (nonpitting edema in bilateral lower extremities).  Neurological: She is alert and oriented to person, place, and time.  Skin: Skin is warm and dry.  Psychiatric: She has a normal mood and affect. Her behavior is normal.   BP (!) 142/54 (BP Location: Right Arm)   Pulse (!) 56   Temp 98.5 F (36.9 C) (Oral)   Ht '5\' 7"'  (1.702 m)   Wt 171 lb (77.6 kg)   BMI 26.78 kg/m  A1C: 6.6%    Assessment & Plan:  1. Essential hypertension Low sodium diet - CMP14+EGFR - metoprolol tartrate (LOPRESSOR) 25 MG tablet; Take 1 tablet (25 mg total) by mouth 2 (two) times daily.  Dispense: 180 tablet; Refill: 1  2. Gastroesophageal reflux disease without esophagitis Avoid spicy foods Do not eat 2 hours prior to bedtime  3. Hypothyroidism due to non-medication exogenous substances - TSH - metoprolol tartrate (LOPRESSOR) 25 MG tablet; Take 1 tablet (25 mg total) by mouth 2 (two) times daily.  Dispense: 180 tablet; Refill: 1  4. Diabetes mellitus type 2, diet-controlled ([place]) Continue to watch carbs in diet so do not hav eto go on medicine - Bayer DCA Hb A1c Waived  5. CKD (chronic kidney disease) stage 3, GFR 30-59 ml/min (HCC) currenlty watching labs  6. Hyperlipidemia, unspecified hyperlipidemia type Low fat diet - Lipid pane l- lovastatin (MEVACOR) 40 MG tablet; Take 2 tablets (80 mg total) by mouth at bedtime.  Dispense: 180 tablet; Refill: 1  7. Persistent atrial fibrillation ([place])  8. Chronic diastolic CHF (congestive heart failure) ([place]) Keep follow up with cardiology  9. Peripheral edema Elevate legs when sitting  10. BMI 26.0-26.9,adult  months  11. Osteopenia, senile Weight bearing exercise - traMADol (ULTRAM) 50 MG tablet; Take 1 tablet (50 mg total) by mouth 2 (two) times  daily as needed for moderate pain or severe pain.  Dispense: 60 tablet; Refill: 1  12. Gastroesophageal reflux disease, esophagitis presence not specified Avoid spicy foods Do not eat 2 hours prior to bedtime - omeprazole (PRILOSEC) 40 MG capsule; Take 1 capsule (40 mg total) by mouth daily.  Dispense: 90 capsule; Refill: 1    Labs pending Health maintenance reviewed Diet and exercise encouraged Continue all meds Follow up  In 3 months  [place], FNP

## 2017-06-16 NOTE — Patient Instructions (Signed)

## 2017-06-17 LAB — CMP14+EGFR
ALBUMIN: 4.2 g/dL (ref 3.5–4.7)
ALK PHOS: 59 IU/L (ref 39–117)
ALT: 7 IU/L (ref 0–32)
AST: 14 IU/L (ref 0–40)
Albumin/Globulin Ratio: 1.8 (ref 1.2–2.2)
BUN / CREAT RATIO: 13 (ref 12–28)
BUN: 16 mg/dL (ref 8–27)
Bilirubin Total: 0.5 mg/dL (ref 0.0–1.2)
CHLORIDE: 101 mmol/L (ref 96–106)
CO2: 26 mmol/L (ref 20–29)
Calcium: 9.1 mg/dL (ref 8.7–10.3)
Creatinine, Ser: 1.27 mg/dL — ABNORMAL HIGH (ref 0.57–1.00)
GFR calc Af Amer: 45 mL/min/{1.73_m2} — ABNORMAL LOW (ref >59)
GFR calc non Af Amer: 39 mL/min/{1.73_m2} — ABNORMAL LOW (ref >59)
GLOBULIN, TOTAL: 2.3 g/dL (ref 1.5–4.5)
Glucose: 142 mg/dL — ABNORMAL HIGH (ref 65–99)
Potassium: 4.4 mmol/L (ref 3.5–5.2)
SODIUM: 142 mmol/L (ref 134–144)
Total Protein: 6.5 g/dL (ref 6.0–8.5)

## 2017-06-17 LAB — LIPID PANEL
CHOLESTEROL TOTAL: 131 mg/dL (ref 100–199)
Chol/HDL Ratio: 2.3 ratio (ref 0.0–4.4)
HDL: 58 mg/dL (ref >39)
LDL Calculated: 52 mg/dL (ref 0–99)
Triglycerides: 106 mg/dL (ref 0–149)
VLDL Cholesterol Cal: 21 mg/dL (ref 5–40)

## 2017-06-17 LAB — TSH: TSH: 5.02 u[IU]/mL — AB (ref 0.450–4.500)

## 2017-06-25 ENCOUNTER — Encounter: Payer: Self-pay | Admitting: *Deleted

## 2017-07-06 ENCOUNTER — Ambulatory Visit (INDEPENDENT_AMBULATORY_CARE_PROVIDER_SITE_OTHER): Payer: Medicare Other | Admitting: Specialist

## 2017-07-07 ENCOUNTER — Other Ambulatory Visit: Payer: Self-pay | Admitting: Nurse Practitioner

## 2017-07-07 DIAGNOSIS — M8580 Other specified disorders of bone density and structure, unspecified site: Secondary | ICD-10-CM

## 2017-07-07 DIAGNOSIS — R609 Edema, unspecified: Secondary | ICD-10-CM

## 2017-07-07 DIAGNOSIS — E782 Mixed hyperlipidemia: Secondary | ICD-10-CM

## 2017-07-07 MED ORDER — FUROSEMIDE 40 MG PO TABS: ORAL_TABLET | ORAL | 2 refills | Status: DC

## 2017-07-07 MED ORDER — RALOXIFENE HCL 60 MG PO TABS: 60.0000 mg | ORAL_TABLET | Freq: Every day | ORAL | 1 refills | Status: DC

## 2017-07-07 NOTE — Telephone Encounter (Signed)
[facility]

## 2017-07-13 ENCOUNTER — Ambulatory Visit (INDEPENDENT_AMBULATORY_CARE_PROVIDER_SITE_OTHER): Payer: Medicare Other | Admitting: Family Medicine

## 2017-07-13 ENCOUNTER — Encounter: Payer: Self-pay | Admitting: Family Medicine

## 2017-07-13 VITALS — BP 171/68 | HR 61 | Temp 98.3°F | Ht 67.0 in | Wt 173.0 lb

## 2017-07-13 DIAGNOSIS — I10 Essential (primary) hypertension: Secondary | ICD-10-CM

## 2017-07-13 DIAGNOSIS — J069 Acute upper respiratory infection, unspecified: Secondary | ICD-10-CM

## 2017-07-13 MED ORDER — BENZONATATE 100 MG PO CAPS: 100.0000 mg | ORAL_CAPSULE | Freq: Two times a day (BID) | ORAL | 0 refills | Status: DC | PRN

## 2017-07-13 MED ORDER — AZITHROMYCIN 250 MG PO TABS: ORAL_TABLET | ORAL | 0 refills | Status: DC

## 2017-07-13 NOTE — Patient Instructions (Addendum)
Follow up with your PCP in the next 2-4 weeks for blood pressure management/ recheck.  Your blood pressure is NOT at goal today.  Your goal is less than 150/90.  It appears that you have a viral upper respiratory infection (cold).  Cold symptoms can last up to 2 weeks.  I have prescribed you an antibiotic to use if your symptoms do not improve, you develop fevers or you develop pus coming from your nose.  I have also sent in [facility] for use twice a day as needed for cough.  Sometimes this is not covered by your insurance.  If this ends up being the case, I would recommend you using one of the Coricidin products for cough and cold (generic is fine).  These are safe in high blood pressure.    - Get plenty of rest and drink plenty of fluids. - Try to breathe moist air. Use a cold mist humidifier. - Consume warm fluids (soup or tea) to provide relief for a stuffy nose and to loosen phlegm. - For nasal stuffiness, try saline nasal spray or a Neti Pot.  Afrin nasal spray can also be used but this product should not be used longer than 3 days or it will cause rebound nasal stuffiness (worsening nasal congestion). - For sore throat pain relief: suck on throat lozenges, hard candy or popsicles; gargle with warm salt water (1/4 tsp. salt per 8 oz. of water); and eat soft, bland foods. - Eat a well-balanced diet. If you cannot, ensure you are getting enough nutrients by taking a daily multivitamin. - Avoid dairy products, as they can thicken phlegm. - Avoid alcohol, as it impairs your body's immune system.  CONTACT YOUR DOCTOR IF YOU EXPERIENCE ANY OF THE FOLLOWING: - High fever - Ear pain - Sinus-type headache - Unusually severe cold symptoms - Cough that gets worse while other cold symptoms improve - Flare up of any chronic lung problem, such as asthma - Your symptoms persist longer than 2 weeks

## 2017-07-13 NOTE — Progress Notes (Signed)
Subjective: CC: URI PCP: [name], FNP HAL:PFXT Katie [name] is a 83 y.o. female presenting to clinic today for:  1. Cold symptoms  Patient reports mildly productive cough, sinus pressure, nasal stuffiness that started >1 week ago.  She now reports sore throat.  She notes that symptoms have worsened over the last 24 hours.  She describes phlegm as clear.  Denies hemoptysis, SOB, dizziness, rash, nausea, vomiting, diarrhea, fevers, chills, myalgia, sick contacts, recent travel.  Patient has used an unknown over-the-counter cough syrup and cough drops with with little relief of symptoms.  Denies history of COPD or asthma.  Denies tobacco use/ exposure.  2. Hypertension Compliant with BP meds, Side effects: none  ROS: Denies headache, dizziness, visual changes, nausea, vomiting, chest pain, LE swelling, abdominal pain or shortness of breath.  Allergies  Allergen Reactions  . Amlodipine Swelling  . Macrodantin Nausea And Vomiting  . Metformin And Related Nausea And Vomiting and Other (See Comments)    Bloating  . Penicillins Rash   Past Medical History:  Diagnosis Date  . Aortic insufficiency    a. Trivial AI by echo 02/2016.  [name] Atrial fibrillation and flutter ([place])    a. Coarse afib vs flutter by EKG 12/2015.  . Cataract   . Chronic diastolic CHF (congestive heart failure) ([place])   . CKD (chronic kidney disease), stage III ([place])   . Edema   . GERD (gastroesophageal reflux disease)   . Hiatal hernia   . Hypercholesterolemia   . Hypertension   . Hypokalemia   . Hypothyroidism   . Left knee pain   . NIDDM (non-insulin dependent diabetes mellitus)    diet controlled   . Obesity   . Premature atrial contractions   . PVC's (premature ventricular contractions)   . URI (upper respiratory infection)   . Vitamin D deficiency    Family History  Problem Relation Age of Onset  . Diabetes Mother   . Stroke Mother   . Heart disease Father   . Uterine cancer Sister   .  Diabetes Sister   . Ovarian cancer Sister   . Colon cancer Sister   . Diabetes Sister   . Liver cancer Sister        \  . Diabetes Brother   . Dementia Brother   . Atrial fibrillation Sister   . Diabetes Sister   . Diabetes Son   . Heart attack Neg Hx   . Hypertension Neg Hx     Current Outpatient Medications:  .  benazepril (LOTENSIN) 40 MG tablet, Take 1.5 tablets (60 mg total) by mouth daily., Disp: 180 tablet, Rfl: 1 .  calcium carbonate (OS-CAL) 600 MG TABS, Take 600 mg by mouth daily. , Disp: , Rfl:  .  Cholecalciferol (VITAMIN D) 2000 UNITS CAPS, Take 2,000 Units by mouth daily. , Disp: , Rfl:  .  etodolac (LODINE) 400 MG tablet, Take 400 mg by mouth daily., Disp: , Rfl:  .  furosemide (LASIX) 40 MG tablet, Take one tablet (40mg ) by mouth in the morning daily.  Take 1/2 tablet (20mg ) by mouth in the afternoon daily., Disp: 135 tablet, Rfl: 2 .  levothyroxine (SYNTHROID, LEVOTHROID) 50 MCG tablet, TAKE 1 TABLET BY MOUTH  DAILY BEFORE BREAKFAST, Disp: 90 tablet, Rfl: 1 .  lovastatin (MEVACOR) 40 MG tablet, Take 2 tablets (80 mg total) by mouth at bedtime., Disp: 180 tablet, Rfl: 1 .  metoprolol tartrate (LOPRESSOR) 25 MG tablet, Take 1 tablet (25 mg total) by  mouth 2 (two) times daily., Disp: 180 tablet, Rfl: 1 .  omeprazole (PRILOSEC) 40 MG capsule, Take 1 capsule (40 mg total) by mouth daily., Disp: 90 capsule, Rfl: 1 .  potassium chloride SA (K-DUR,KLOR-CON) 20 MEQ tablet, Take 1 tablet (20 mEq total) by mouth 2 (two) times daily., Disp: 180 tablet, Rfl: 1 .  raloxifene (EVISTA) 60 MG tablet, Take 1 tablet (60 mg total) by mouth daily., Disp: 90 tablet, Rfl: 1 .  Rivaroxaban (XARELTO) 15 MG TABS tablet, Take 1 tablet (15 mg total) by mouth daily with supper., Disp: 30 tablet, Rfl: 11 .  traMADol (ULTRAM) 50 MG tablet, Take 1 tablet (50 mg total) by mouth 2 (two) times daily as needed for moderate pain or severe pain., Disp: 60 tablet, Rfl: 1  Social Hx: non smoker.  ROS: Per  HPI  Objective: Office vital signs reviewed. BP (!) 171/68   Pulse 61   Temp 98.3 F (36.8 C) (Oral)   Ht 5\' 7"  (1.702 m)   Wt 173 lb (78.5 kg)   BMI 27.10 kg/m   Physical Examination:  General: Awake, alert, well nourished, No acute distress HEENT: Normal    Neck: No masses palpated. No lymphadenopathy    Ears: Tympanic membranes intact, normal light reflex, no erythema, no bulging    Eyes: PERRLA, extraocular membranes intact, sclera white, no ocular discharge    Nose: nasal turbinates moist, clear nasal discharge    Throat: moist mucus membranes, no erythema, no tonsillar exudate.  Airway is patent Cardio: regular rate and rhythm, S1S2 heard, no murmurs appreciated Pulm: clear to auscultation bilaterally, no wheezes, rhonchi or rales; normal work of breathing on room air Psych: mood irritable.  Speech normal.  Assessment/ Plan: 83 y.o. female   1. URI with cough and congestion Patient is afebrile well-appearing.  Her blood pressure is slightly elevated.  She demonstrates no systemic signs of illness.  I suspect that her URI is likely viral in nature.  She does care for her husband who is completely dependent on her.  She is very worried that she may infect him.  We did discuss that antibiotics are not indicated in viral infections.  However, she notes that she never improved without an antibiotic.  Therefore, a pocket prescription was provided to the patient for Z-Pak, as she does have a penicillin allergy.  Instructions for use were reviewed with the patient.  Handout was also provided.  Tessalon Perles were prescribed as well to use twice daily as needed cough.  I did let her know that if the insurance does not pay for this, she should grab a box of Coricidin cough and cold.  I did recommend she completely discontinue over-the-counter cough and cold medications that are not intended for folks with hypertension.  She voiced good understanding.  Home care instructions were reviewed  with the patient. Strict return precautions and reasons for emergent evaluation in the emergency department review with patient.  They voiced understanding and will follow-up as needed.  2. Essential hypertension Blood pressure elevated today despite use of medications.  I suspect that this may be secondary to her recent use of OTC cough and cold medications.  I did recommend that she discontinue these.  She will monitor blood pressure daily.  She will follow-up with her PCP in the next 2-4 weeks for blood pressure management.  No red flag signs on exam.    Meds ordered this encounter  Medications  . benzonatate (TESSALON) 100 MG capsule  Sig: Take 1 capsule (100 mg total) by mouth 2 (two) times daily as needed for cough.    Dispense:  20 capsule    Refill:  0  . azithromycin (ZITHROMAX) 250 MG tablet    Sig: Take 500mg  day 1, then take 250mg  days 2-5    Dispense:  6 tablet    Refill:  0     Ashly Windell Moulding, DO [place] [phone]

## 2017-07-20 ENCOUNTER — Other Ambulatory Visit: Payer: Self-pay | Admitting: Nurse Practitioner

## 2017-07-21 ENCOUNTER — Other Ambulatory Visit: Payer: Self-pay | Admitting: Nurse Practitioner

## 2017-07-21 DIAGNOSIS — E032 Hypothyroidism due to medicaments and other exogenous substances: Secondary | ICD-10-CM

## 2017-07-21 MED ORDER — LEVOTHYROXINE SODIUM 50 MCG PO TABS: ORAL_TABLET | ORAL | 0 refills | Status: DC

## 2017-07-21 NOTE — Telephone Encounter (Signed)
Pt aware RF sent to [facility]

## 2017-07-21 NOTE — Telephone Encounter (Signed)
What is the name of the medication? Levothyroxine 0.05  Have you contacted your pharmacy to request a refill? NO  Which pharmacy would you like this sent to? [facility]   Patient notified that their request is being sent to the clinical staff for review and that they should receive a call once it is complete. If they do not receive a call within 24 hours they can check with their pharmacy or our office.

## 2017-07-22 ENCOUNTER — Encounter: Payer: Self-pay | Admitting: Internal Medicine

## 2017-08-12 ENCOUNTER — Other Ambulatory Visit: Payer: Self-pay | Admitting: Nurse Practitioner

## 2017-08-12 DIAGNOSIS — E876 Hypokalemia: Secondary | ICD-10-CM

## 2017-08-12 MED ORDER — POTASSIUM CHLORIDE CRYS ER 20 MEQ PO TBCR: 20.0000 meq | EXTENDED_RELEASE_TABLET | Freq: Two times a day (BID) | ORAL | 1 refills | Status: DC

## 2017-08-12 NOTE — Telephone Encounter (Signed)
What is the name of the medication? potassium chloride SA (K-DUR,KLOR-CON) 20 MEQ tablet 3 mth supply   Have you contacted your pharmacy to request a refill? no   Which pharmacy would you like this sent to? [facility]   Patient notified that their request is being sent to the clinical staff for review and that they should receive a call once it is complete. If they do not receive a call within 24 hours they can check with their pharmacy or our office.

## 2017-08-12 NOTE — Telephone Encounter (Signed)
Pt notified [facility] per MMM

## 2017-08-24 ENCOUNTER — Encounter: Payer: Self-pay | Admitting: Family Medicine

## 2017-08-24 DIAGNOSIS — Z1231 Encounter for screening mammogram for malignant neoplasm of breast: Secondary | ICD-10-CM | POA: Diagnosis not present

## 2017-08-26 ENCOUNTER — Ambulatory Visit (INDEPENDENT_AMBULATORY_CARE_PROVIDER_SITE_OTHER): Payer: Medicare Other | Admitting: Specialist

## 2017-09-08 ENCOUNTER — Other Ambulatory Visit: Payer: Self-pay | Admitting: Nurse Practitioner

## 2017-09-08 DIAGNOSIS — E032 Hypothyroidism due to medicaments and other exogenous substances: Secondary | ICD-10-CM

## 2017-09-14 ENCOUNTER — Other Ambulatory Visit: Payer: Self-pay | Admitting: Nurse Practitioner

## 2017-09-14 DIAGNOSIS — I10 Essential (primary) hypertension: Secondary | ICD-10-CM

## 2017-09-16 ENCOUNTER — Ambulatory Visit: Payer: Medicare Other | Admitting: Nurse Practitioner

## 2017-09-22 ENCOUNTER — Encounter: Payer: Self-pay | Admitting: Nurse Practitioner

## 2017-09-22 ENCOUNTER — Ambulatory Visit (INDEPENDENT_AMBULATORY_CARE_PROVIDER_SITE_OTHER): Payer: Medicare Other

## 2017-09-22 ENCOUNTER — Ambulatory Visit (INDEPENDENT_AMBULATORY_CARE_PROVIDER_SITE_OTHER): Payer: Medicare Other | Admitting: Nurse Practitioner

## 2017-09-22 VITALS — BP 140/64 | HR 53 | Temp 97.1°F | Ht 67.0 in | Wt 170.0 lb

## 2017-09-22 DIAGNOSIS — M8588 Other specified disorders of bone density and structure, other site: Secondary | ICD-10-CM | POA: Diagnosis not present

## 2017-09-22 DIAGNOSIS — R609 Edema, unspecified: Secondary | ICD-10-CM

## 2017-09-22 DIAGNOSIS — I10 Essential (primary) hypertension: Secondary | ICD-10-CM

## 2017-09-22 DIAGNOSIS — I5032 Chronic diastolic (congestive) heart failure: Secondary | ICD-10-CM | POA: Diagnosis not present

## 2017-09-22 DIAGNOSIS — E119 Type 2 diabetes mellitus without complications: Secondary | ICD-10-CM

## 2017-09-22 DIAGNOSIS — K219 Gastro-esophageal reflux disease without esophagitis: Secondary | ICD-10-CM | POA: Diagnosis not present

## 2017-09-22 DIAGNOSIS — M85851 Other specified disorders of bone density and structure, right thigh: Secondary | ICD-10-CM | POA: Diagnosis not present

## 2017-09-22 DIAGNOSIS — K5909 Other constipation: Secondary | ICD-10-CM

## 2017-09-22 DIAGNOSIS — E032 Hypothyroidism due to medicaments and other exogenous substances: Secondary | ICD-10-CM | POA: Diagnosis not present

## 2017-09-22 DIAGNOSIS — Z780 Asymptomatic menopausal state: Secondary | ICD-10-CM | POA: Diagnosis not present

## 2017-09-22 DIAGNOSIS — E782 Mixed hyperlipidemia: Secondary | ICD-10-CM | POA: Diagnosis not present

## 2017-09-22 DIAGNOSIS — Z6826 Body mass index (BMI) 26.0-26.9, adult: Secondary | ICD-10-CM | POA: Diagnosis not present

## 2017-09-22 DIAGNOSIS — I481 Persistent atrial fibrillation: Secondary | ICD-10-CM | POA: Diagnosis not present

## 2017-09-22 DIAGNOSIS — E876 Hypokalemia: Secondary | ICD-10-CM

## 2017-09-22 DIAGNOSIS — I4819 Other persistent atrial fibrillation: Secondary | ICD-10-CM

## 2017-09-22 LAB — BAYER DCA HB A1C WAIVED: HB A1C (BAYER DCA - WAIVED): 6.7 % (ref <7.0)

## 2017-09-22 MED ORDER — ETODOLAC 400 MG PO TABS: 400.0000 mg | ORAL_TABLET | Freq: Two times a day (BID) | ORAL | 1 refills | Status: DC

## 2017-09-22 MED ORDER — METOPROLOL TARTRATE 25 MG PO TABS: 25.0000 mg | ORAL_TABLET | Freq: Two times a day (BID) | ORAL | 1 refills | Status: DC

## 2017-09-22 MED ORDER — OMEPRAZOLE 40 MG PO CPDR: 40.0000 mg | DELAYED_RELEASE_CAPSULE | Freq: Every day | ORAL | 1 refills | Status: DC

## 2017-09-22 MED ORDER — LOVASTATIN 40 MG PO TABS: 80.0000 mg | ORAL_TABLET | Freq: Every day | ORAL | 1 refills | Status: DC

## 2017-09-22 NOTE — Progress Notes (Signed)
Subjective:    Patient ID: Katie [name], female    DOB: 08-23-1931, 84 y.o.   MRN: [id number]  HPI  Katie [name] is here today for follow up of chronic medical problem.  Outpatient Encounter Medications as of 09/22/2017  Medication Sig  . benazepril (LOTENSIN) 40 MG tablet TAKE 1 & 1/2 (ONE & ONE-HALF) TABLETS BY MOUTH ONCE DAILY  . calcium carbonate (OS-CAL) 600 MG TABS Take 600 mg by mouth daily.   . Cholecalciferol (VITAMIN D) 2000 UNITS CAPS Take 2,000 Units by mouth daily.   [name] etodolac (LODINE) 400 MG tablet TAKE 1 TABLET BY MOUTH TWO  TIMES DAILY  . furosemide (LASIX) 40 MG tablet Take one tablet (78m) by mouth in the morning daily.  Take 1/2 tablet (232m by mouth in the afternoon daily.  . [name] (SYNTHROID, LEVOTHROID) 50 MCG tablet TAKE 1 TABLET BY MOUTH  DAILY BEFORE BREAKFAST  . lovastatin (MEVACOR) 40 MG tablet Take 2 tablets (80 mg total) by mouth at bedtime.  . metoprolol tartrate (LOPRESSOR) 25 MG tablet Take 1 tablet (25 mg total) by mouth 2 (two) times daily.  . [name] (PRILOSEC) 40 MG capsule Take 1 capsule (40 mg total) by mouth daily.  . potassium chloride SA (K-DUR,KLOR-CON) 20 MEQ tablet Take 1 tablet (20 mEq total) by mouth 2 (two) times daily.  . raloxifene (EVISTA) 60 MG tablet Take 1 tablet (60 mg total) by mouth daily.  . Rivaroxaban (XARELTO) 15 MG TABS tablet Take 1 tablet (15 mg total) by mouth daily with supper.  . traMADol (ULTRAM) 50 MG tablet Take 1 tablet (50 mg total) by mouth 2 (two) times daily as needed for moderate pain or severe pain.  . clindamycin (CLEOCIN) 150 MG capsule      1. Essential hypertension  No chest, sob or headache. Does not check blood pressure at home BP Readings from Last 3 Encounters:  09/22/17 (!) 159/60  07/13/17 (!) 171/68  06/16/17 (!) 142/54     2. Diabetes mellitus type 2, diet-controlled ([place] Last hgba1c was 6.6%. Does not check blood sugars at home  3. Mixed hyperlipidemia  does not watch diet    4. Persistent atrial fibrillation ([place] Says she has occasional episodes of heart racing about 1-2 x a month and last about 1 hour.  5. Chronic diastolic CHF (congestive heart failure) (HCC)  No edema  6. Gastroesophageal reflux disease without esophagitis  Takes omeprazole daily  7. Other constipation  Has diarrhea today but is on antibiotic for infected tooth  8. Hypokalemia  No c/o lower ext cramping  9. Peripheral edema  No swelling today but will have by end of day  10. 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 She has 2 neices that come in and help her get him up and put him to bed.    Review of Systems  Constitutional: Negative for activity change and appetite change.  HENT: Negative.   Eyes: Negative for pain.  Respiratory: Negative for shortness of breath.   Cardiovascular: Negative for chest pain, palpitations and leg swelling.  Gastrointestinal: Negative for abdominal pain.  Endocrine: Negative for polydipsia.  Genitourinary: Negative.   Skin: Negative for rash.  Neurological: Negative for dizziness, weakness and headaches.  Hematological: Does not bruise/bleed easily.  Psychiatric/Behavioral: Negative.   All other systems reviewed and are negative.      Objective:  Physical Exam  Constitutional: She is oriented to person, place, and time. She appears well-developed and well-nourished.  HENT:  Nose: Nose normal.  Mouth/Throat: Oropharynx is clear and moist.  Eyes: EOM are normal.  Neck: Trachea normal, normal range of motion and full passive range of motion without pain. Neck supple. No JVD present. Carotid bruit is not present. No thyromegaly present.  Cardiovascular: Normal rate, regular rhythm, normal heart sounds and intact distal pulses. Exam reveals no gallop and no friction rub.  No murmur heard. Pulmonary/Chest: Effort normal and breath sounds normal.   Abdominal: Soft. Bowel sounds are normal. She exhibits no distension and no mass. There is no tenderness.  Musculoskeletal: Normal range of motion.  Lymphadenopathy:    She has no cervical adenopathy.  Neurological: She is alert and oriented to person, place, and time. She has normal reflexes.  Skin: Skin is warm and dry.  Psychiatric: She has a normal mood and affect. Her behavior is normal. Judgment and thought content normal.   BP 140/64 (BP Location: Left Arm, Cuff Size: Normal)   Pulse (!) 53   Temp (!) 97.1 F (36.2 C) (Oral)   Ht '5\' 7"'  (1.702 m)   Wt 170 lb (77.1 kg)   BMI 26.63 kg/m     HGBA1c 6.7% today      Assessment & Plan:  1. Essential hypertension Low sodium diet - CMP14+EGFR - metoprolol tartrate (LOPRESSOR) 25 MG tablet; Take 1 tablet (25 mg total) by mouth 2 (two) times daily.  Dispense: 180 tablet; Refill: 1  2. Diabetes mellitus type 2, diet-controlled ([place]) Continue  To watch carbsin diet - Bayer DCA Hb A1c Waived  3. Mixed hyperlipidemia Low fat diet - Lipid panel - lovastatin (MEVACOR) 40 MG tablet; Take 2 tablets (80 mg total) by mouth at bedtime.  Dispense: 180 tablet; Refill: 1  4. Persistent atrial fibrillation (HCC) Keep diary of episodes  5. Chronic diastolic CHF (congestive heart failure) ([place])  6. Gastroesophageal reflux disease without esophagitis Avoid spicy foods Do not eat 2 hours prior to bedtime  7. Other constipation mirlax as needed  8. Hypokalemia  9. Peripheral edema Elevate legs when sitting  10. BMI 26.0-26.9,adult Discussed diet and exercise for person with BMI >25 Will recheck weight in 3-6 months  11. Osteopenia of lumbar spine Weight bearing exercises - DG WR[facility] DEXA  12. Gastroesophageal reflux disease, esophagitis presence not specified Avoid spicy foods Do not eat 2 hours prior to bedtime - omeprazole (PRILOSEC) 40 MG capsule; Take 1 capsule (40 mg total) by mouth daily.  Dispense: 90 capsule; Refill:  1  13. Hypothyroidism due to non-medication exogenous substances - metoprolol tartrate (LOPRESSOR) 25 MG tablet; Take 1 tablet (25 mg total) by mouth 2 (two) times daily.  Dispense: 180 tablet; Refill: 1    Labs pending Health maintenance reviewed Diet and exercise encouraged Continue all meds Follow up  In 3 months   [place], FNP

## 2017-09-22 NOTE — Patient Instructions (Signed)

## 2017-09-23 LAB — CMP14+EGFR
ALK PHOS: 72 IU/L (ref 39–117)
ALT: 9 IU/L (ref 0–32)
AST: 14 IU/L (ref 0–40)
Albumin/Globulin Ratio: 1.5 (ref 1.2–2.2)
Albumin: 4 g/dL (ref 3.5–4.7)
BILIRUBIN TOTAL: 0.4 mg/dL (ref 0.0–1.2)
BUN/Creatinine Ratio: 17 (ref 12–28)
BUN: 23 mg/dL (ref 8–27)
CHLORIDE: 103 mmol/L (ref 96–106)
CO2: 23 mmol/L (ref 20–29)
CREATININE: 1.35 mg/dL — AB (ref 0.57–1.00)
Calcium: 9.1 mg/dL (ref 8.7–10.3)
GFR calc Af Amer: 42 mL/min/{1.73_m2} — ABNORMAL LOW (ref >59)
GFR calc non Af Amer: 36 mL/min/{1.73_m2} — ABNORMAL LOW (ref >59)
Globulin, Total: 2.6 g/dL (ref 1.5–4.5)
Glucose: 120 mg/dL — ABNORMAL HIGH (ref 65–99)
POTASSIUM: 4.5 mmol/L (ref 3.5–5.2)
Sodium: 142 mmol/L (ref 134–144)
Total Protein: 6.6 g/dL (ref 6.0–8.5)

## 2017-09-23 LAB — LIPID PANEL
CHOLESTEROL TOTAL: 140 mg/dL (ref 100–199)
Chol/HDL Ratio: 2.4 ratio (ref 0.0–4.4)
HDL: 59 mg/dL (ref >39)
LDL CALC: 62 mg/dL (ref 0–99)
TRIGLYCERIDES: 95 mg/dL (ref 0–149)
VLDL Cholesterol Cal: 19 mg/dL (ref 5–40)

## 2017-09-30 ENCOUNTER — Other Ambulatory Visit: Payer: Self-pay | Admitting: *Deleted

## 2017-10-07 ENCOUNTER — Ambulatory Visit (INDEPENDENT_AMBULATORY_CARE_PROVIDER_SITE_OTHER): Payer: Medicare Other | Admitting: Specialist

## 2017-10-12 ENCOUNTER — Other Ambulatory Visit: Payer: Self-pay | Admitting: Nurse Practitioner

## 2017-10-12 DIAGNOSIS — M8580 Other specified disorders of bone density and structure, unspecified site: Secondary | ICD-10-CM

## 2017-10-12 DIAGNOSIS — R609 Edema, unspecified: Secondary | ICD-10-CM

## 2017-10-12 DIAGNOSIS — E032 Hypothyroidism due to medicaments and other exogenous substances: Secondary | ICD-10-CM

## 2017-10-12 MED ORDER — RALOXIFENE HCL 60 MG PO TABS: 60.0000 mg | ORAL_TABLET | Freq: Every day | ORAL | 1 refills | Status: DC

## 2017-10-12 MED ORDER — FUROSEMIDE 40 MG PO TABS: ORAL_TABLET | ORAL | 1 refills | Status: DC

## 2017-10-12 MED ORDER — LEVOTHYROXINE SODIUM 50 MCG PO TABS: 50.0000 ug | ORAL_TABLET | Freq: Every day | ORAL | 2 refills | Status: DC

## 2017-10-12 NOTE — Telephone Encounter (Signed)
Looks like patient was supposed to have thyroid panel repeated at last visit. Please advise

## 2017-10-23 DIAGNOSIS — D485 Neoplasm of uncertain behavior of skin: Secondary | ICD-10-CM | POA: Diagnosis not present

## 2017-10-23 DIAGNOSIS — C4442 Squamous cell carcinoma of skin of scalp and neck: Secondary | ICD-10-CM | POA: Diagnosis not present

## 2017-10-23 DIAGNOSIS — L570 Actinic keratosis: Secondary | ICD-10-CM | POA: Diagnosis not present

## 2017-10-23 DIAGNOSIS — C44622 Squamous cell carcinoma of skin of right upper limb, including shoulder: Secondary | ICD-10-CM | POA: Diagnosis not present

## 2017-10-28 ENCOUNTER — Telehealth: Payer: Self-pay | Admitting: Nurse Practitioner

## 2017-10-28 ENCOUNTER — Other Ambulatory Visit: Payer: Self-pay | Admitting: Nurse Practitioner

## 2017-10-28 DIAGNOSIS — M8580 Other specified disorders of bone density and structure, unspecified site: Secondary | ICD-10-CM

## 2017-11-11 ENCOUNTER — Telehealth (INDEPENDENT_AMBULATORY_CARE_PROVIDER_SITE_OTHER): Payer: Self-pay | Admitting: *Deleted

## 2017-11-11 ENCOUNTER — Encounter (INDEPENDENT_AMBULATORY_CARE_PROVIDER_SITE_OTHER): Payer: Self-pay | Admitting: Specialist

## 2017-11-11 ENCOUNTER — Ambulatory Visit (INDEPENDENT_AMBULATORY_CARE_PROVIDER_SITE_OTHER): Payer: Medicare Other | Admitting: Specialist

## 2017-11-11 VITALS — BP 139/63 | HR 48 | Ht 67.0 in | Wt 169.0 lb

## 2017-11-11 DIAGNOSIS — M8580 Other specified disorders of bone density and structure, unspecified site: Secondary | ICD-10-CM

## 2017-11-11 DIAGNOSIS — M48062 Spinal stenosis, lumbar region with neurogenic claudication: Secondary | ICD-10-CM | POA: Diagnosis not present

## 2017-11-11 MED ORDER — TRAMADOL HCL 50 MG PO TABS: ORAL_TABLET | ORAL | 0 refills | Status: DC

## 2017-11-11 NOTE — Telephone Encounter (Signed)
OK to hold Eliquis 2 days before procedure.   Lauree Chandler 11/11/2017 5:04 PM

## 2017-11-11 NOTE — Patient Instructions (Addendum)
Avoid bending, stooping and avoid lifting weights greater than 10 lbs. Avoid prolong standing and walking. Avoid frequent bending and stooping  No lifting greater than 10 lbs. May use ice or moist heat for pain. Weight loss is of benefit. Handicap license is approved. Dr. Romona Curls secretary/Assistant will call to arrange for epidural steroid injection  She takes xarelto so she is unable to take NSAIDS, she is not abusive in her regular use of tramadol for pain control and ESIs. A pain management program is a consideration.

## 2017-11-11 NOTE — Progress Notes (Signed)
Office Visit Note   Patient: Katie [name]           Date of Birth: [birth year]           MRN: [id number] Visit Date: 11/11/2017              Requested by: [name], [address] [place], [place] [id number] PCP: [name], FNP   Assessment & Plan: Visit Diagnoses:  1. Spinal stenosis of lumbar region with neurogenic claudication   2. Osteopenia, senile     Plan: Avoid bending, stooping and avoid lifting weights greater than 10 lbs. Avoid prolong standing and walking. Avoid frequent bending and stooping  No lifting greater than 10 lbs. May use ice or moist heat for pain. Weight loss is of benefit. Handicap license is approved. Dr. Romona Curls secretary/Assistant will call to arrange for epidural steroid injection  She takes xarelto so she is unable to take NSAIDS, she is not abusive in her regular use of tramadol for pain control and ESIs. A pain management program is a consideration.   Follow-Up Instructions: Return in about 6 months (around 05/14/2018).   Orders:  Orders Placed This Encounter  Procedures  . Ambulatory referral to Physical Medicine Rehab   Meds ordered this encounter  Medications  . traMADol (ULTRAM) 50 MG tablet    Sig: TAKE 1 TABLET BY MOUTH TWICE DAILY AS NEEDED FOR MODERATE PAIN OR SEVERE PAIN    Dispense:  30 tablet    Refill:  0      Procedures: No procedures performed   Clinical Data: No additional findings.   Subjective: Chief Complaint  Patient presents with  . Lower Back - Pain    84 year old female with history of lumbar spinal stenosis with foramenal narrowing left L3-4 and L4-5. She returns today reporting that she feels like the shot given her last year Is wearing off and she is experiencing pain into the left buttock, left lateral thigh and into the left medial knee area, not in the knee joint. Pain is worse with standing and walking. No bowel or bladder difficulty. She continues to care for her  spouse at home who has severe diabetes, leg condition resulting in amputation and now with dementia, he is under hospice care for one year now.   Review of Systems  Constitutional: Negative.   HENT: Negative.   Eyes: Negative.   Respiratory: Negative.   Cardiovascular: Negative.   Gastrointestinal: Negative.   Endocrine: Negative.   Genitourinary: Negative.   Musculoskeletal: Negative.   Skin: Negative.   Allergic/Immunologic: Negative.   Neurological: Negative.   Hematological: Negative.   Psychiatric/Behavioral: Negative.      Objective: Vital Signs: BP 139/63   Pulse (!) 48   Ht 5\' 7"  (1.702 m)   Wt 169 lb (76.7 kg)   BMI 26.47 kg/m   Physical Exam  Constitutional: She is oriented to person, place, and time. She appears well-developed and well-nourished.  HENT:  Head: Normocephalic and atraumatic.  Eyes: Pupils are equal, round, and reactive to light. EOM are normal.  Neck: Normal range of motion. Neck supple.  Pulmonary/Chest: Effort normal and breath sounds normal.  Abdominal: Soft. Bowel sounds are normal.  Neurological: She is alert and oriented to person, place, and time.  Skin: Skin is warm and dry.  Psychiatric: She has a normal mood and affect. Her behavior is normal. Judgment and thought content normal.    Back Exam   Tenderness  The patient  is experiencing tenderness in the lumbar.  Range of Motion  Extension: abnormal  Flexion: normal  Lateral bend right: normal  Lateral bend left: normal  Rotation right: normal  Rotation left: normal   Muscle Strength  Right Quadriceps:  5/5  Left Quadriceps:  4/5  Right Hamstrings:  5/5  Left Hamstrings:  5/5   Tests  Straight leg raise right: negative Straight leg raise left: negative  Reflexes  Patellar: abnormal Achilles: normal Biceps: normal Babinski's sign: normal   Other  Toe walk: normal Heel walk: normal Sensation: normal Gait: abnormal  Erythema: no back redness      Specialty  Comments:  No specialty comments available.  Imaging: No results found.   PMFS History: Patient Active Problem List   Diagnosis Date Noted  . Persistent atrial fibrillation ([place]) 04/27/2017  . Chronic diastolic CHF (congestive heart failure) ([place]) 04/27/2017  . CKD (chronic kidney disease) stage 3, GFR 30-59 ml/min (HCC) 05/14/2015  . BMI 26.0-26.9,adult 05/11/2015  . Peripheral edema 07/06/2014  . Hypokalemia 07/22/2013  . Hypothyroidism 07/22/2013  . Osteopenia, senile 03/09/2013  . Constipation 01/04/2013  . Hypertension 05/07/2012  . Hyperlipidemia 05/07/2012  . Diabetes mellitus type 2, diet-controlled ([place]) 05/07/2012  . GERD (gastroesophageal reflux disease) 05/07/2012   Past Medical History:  Diagnosis Date  . Aortic insufficiency    a. Trivial AI by echo 02/2016.  [name] Atrial fibrillation and flutter ([place])    a. Coarse afib vs flutter by EKG 12/2015.  . Cataract   . Chronic diastolic CHF (congestive heart failure) ([place])   . CKD (chronic kidney disease), stage III ([place])   . Edema   . GERD (gastroesophageal reflux disease)   . Hiatal hernia   . Hypercholesterolemia   . Hypertension   . Hypokalemia   . Hypothyroidism   . Left knee pain   . NIDDM (non-insulin dependent diabetes mellitus)    diet controlled   . Obesity   . Premature atrial contractions   . PVC's (premature ventricular contractions)   . URI (upper respiratory infection)   . Vitamin D deficiency     Family History  Problem Relation Age of Onset  . Diabetes Mother   . Stroke Mother   . Heart disease Father   . Uterine cancer Sister   . Diabetes Sister   . Ovarian cancer Sister   . Colon cancer Sister   . Diabetes Sister   . Liver cancer Sister        \  . Diabetes Brother   . Dementia Brother   . Atrial fibrillation Sister   . Diabetes Sister   . Diabetes Son   . Heart attack Neg Hx   . Hypertension Neg Hx     Past Surgical History:  Procedure Laterality Date  . APPENDECTOMY  1980    . BACK SURGERY    . CATARACT EXTRACTION, BILATERAL    . CHOLECYSTECTOMY  5/00  . COLONOSCOPY    . TONSILLECTOMY    . TOTAL ABDOMINAL HYSTERECTOMY W/ BILATERAL SALPINGOOPHORECTOMY  1980  . UPPER GASTROINTESTINAL ENDOSCOPY     Social History   Occupational History  . Occupation: Retired  Tobacco Use  . Smoking status: Never Smoker  . Smokeless tobacco: Never Used  Substance and Sexual Activity  . Alcohol use: No  . Drug use: No  . Sexual activity: Never

## 2017-11-16 ENCOUNTER — Other Ambulatory Visit: Payer: Self-pay | Admitting: Nurse Practitioner

## 2017-11-16 DIAGNOSIS — E782 Mixed hyperlipidemia: Secondary | ICD-10-CM

## 2017-11-16 DIAGNOSIS — E876 Hypokalemia: Secondary | ICD-10-CM

## 2017-11-16 DIAGNOSIS — I10 Essential (primary) hypertension: Secondary | ICD-10-CM

## 2017-11-16 DIAGNOSIS — E032 Hypothyroidism due to medicaments and other exogenous substances: Secondary | ICD-10-CM

## 2017-11-16 DIAGNOSIS — K219 Gastro-esophageal reflux disease without esophagitis: Secondary | ICD-10-CM

## 2017-11-17 ENCOUNTER — Other Ambulatory Visit: Payer: Self-pay | Admitting: *Deleted

## 2017-11-17 MED ORDER — ETODOLAC 400 MG PO TABS: 400.0000 mg | ORAL_TABLET | Freq: Two times a day (BID) | ORAL | 0 refills | Status: DC

## 2017-11-23 ENCOUNTER — Telehealth (INDEPENDENT_AMBULATORY_CARE_PROVIDER_SITE_OTHER): Payer: Self-pay | Admitting: Physical Medicine and Rehabilitation

## 2017-11-23 NOTE — Telephone Encounter (Signed)
Patient left message asking about her injection appointment.

## 2017-12-09 ENCOUNTER — Ambulatory Visit (INDEPENDENT_AMBULATORY_CARE_PROVIDER_SITE_OTHER): Payer: Medicare Other | Admitting: Physical Medicine and Rehabilitation

## 2017-12-09 ENCOUNTER — Encounter (INDEPENDENT_AMBULATORY_CARE_PROVIDER_SITE_OTHER): Payer: Self-pay | Admitting: Physical Medicine and Rehabilitation

## 2017-12-09 ENCOUNTER — Ambulatory Visit (INDEPENDENT_AMBULATORY_CARE_PROVIDER_SITE_OTHER): Payer: Medicare Other

## 2017-12-09 ENCOUNTER — Other Ambulatory Visit: Payer: Self-pay | Admitting: Nurse Practitioner

## 2017-12-09 VITALS — BP 176/71 | HR 54

## 2017-12-09 DIAGNOSIS — M5416 Radiculopathy, lumbar region: Secondary | ICD-10-CM

## 2017-12-09 DIAGNOSIS — I10 Essential (primary) hypertension: Secondary | ICD-10-CM

## 2017-12-09 MED ORDER — BETAMETHASONE SOD PHOS & ACET 6 (3-3) MG/ML IJ SUSP
12.0000 mg | Freq: Once | INTRAMUSCULAR | Status: AC
  Administered 2017-12-09: 12 mg

## 2017-12-09 NOTE — Patient Instructions (Signed)

## 2017-12-09 NOTE — Progress Notes (Signed)
Numeric Pain Rating Scale and Functional Assessment Average Pain 7   In the last MONTH (on 0-10 scale) has pain interfered with the following?  1. General activity like being  able to carry out your everyday physical activities such as walking, climbing stairs, carrying groceries, or moving a chair?  Rating(5)   +Driver, -BT( Xarelto held since 4/19), -Dye Allergies.

## 2017-12-16 ENCOUNTER — Encounter: Payer: Self-pay | Admitting: Family Medicine

## 2017-12-16 ENCOUNTER — Ambulatory Visit (INDEPENDENT_AMBULATORY_CARE_PROVIDER_SITE_OTHER): Payer: Medicare Other | Admitting: Family Medicine

## 2017-12-16 VITALS — BP 177/67 | HR 51 | Temp 99.8°F | Wt 166.0 lb

## 2017-12-16 DIAGNOSIS — J0101 Acute recurrent maxillary sinusitis: Secondary | ICD-10-CM | POA: Diagnosis not present

## 2017-12-16 MED ORDER — AZITHROMYCIN 250 MG PO TABS: ORAL_TABLET | ORAL | 0 refills | Status: DC

## 2017-12-16 MED ORDER — BENZONATATE 100 MG PO CAPS: 100.0000 mg | ORAL_CAPSULE | Freq: Two times a day (BID) | ORAL | 0 refills | Status: DC | PRN

## 2017-12-16 NOTE — Progress Notes (Signed)
BP (!) 188/65   Pulse (!) 51   Temp 99.8 F (37.7 C) (Oral)   Wt 166 lb (75.3 kg)   BMI 26.00 kg/m    Subjective:    Patient ID: Katie [name], female    DOB: [birth year], 84 y.o.   MRN: [id number]  HPI: Katie [name] is a 84 y.o. female presenting on 12/16/2017 for Sinusitis (sinus pressure, congestion, drainage, runny nose, dizziness (fell this morning but is okay)  had left over prednisone at home and started taking them 5 days ago, has one dose left)   HPI Sinus congestion and drainage and dizziness Patient comes in complaining of sinus congestion and pressure and drainage under both of her eyes that is like her sinusitis that she gets frequently.  She found some old prednisone and started taking it over the past 5 days.  She said she had one episode yesterday of palpitations but came along with that and because of that she felt more lightheaded fell backwards.  She denies any significant pain from it but was concerned about that.  Patient denies any fevers or chills or shortness of breath or wheezing.  She denies any chest pain.  She has used the prednisone which did help significantly but she would like something to help get rid of it completely.  She denies any sick contacts that she knows of.  Relevant past medical, surgical, family and social history reviewed and updated as indicated. Interim medical history since our last visit reviewed. Allergies and medications reviewed and updated.  Review of Systems  Constitutional: Negative for chills and fever.  HENT: Positive for congestion, postnasal drip, rhinorrhea, sinus pressure and sore throat. Negative for ear discharge, ear pain and sneezing.   Eyes: Negative for pain, redness and visual disturbance.  Respiratory: Positive for cough. Negative for chest tightness and shortness of breath.   Cardiovascular: Negative for chest pain and leg swelling.  Genitourinary: Negative for difficulty urinating and dysuria.  Musculoskeletal:  Negative for back pain and gait problem.  Skin: Negative for rash.  Neurological: Negative for light-headedness and headaches.  Psychiatric/Behavioral: Negative for agitation and behavioral problems.  All other systems reviewed and are negative.   Per HPI unless specifically indicated above   Allergies as of 12/16/2017      Reactions   Amlodipine Swelling   Macrodantin Nausea And Vomiting   Metformin And Related Nausea And Vomiting, Other (See Comments)   Bloating   Penicillins Rash      Medication List        Accurate as of 12/16/17  4:06 PM. Always use your most recent med list.          azithromycin 250 MG tablet Commonly known as:  ZITHROMAX Take 2 the first day and then one each day after.   benazepril 40 MG tablet Commonly known as:  LOTENSIN TAKE 1 & 1/2 (ONE & ONE-HALF) TABLETS BY MOUTH ONCE DAILY   benzonatate 100 MG capsule Commonly known as:  TESSALON Take 1 capsule (100 mg total) by mouth 2 (two) times daily as needed for cough.   calcium carbonate 600 MG Tabs tablet Commonly known as:  OS-CAL Take 600 mg by mouth daily.   etodolac 400 MG tablet Commonly known as:  LODINE Take 1 tablet (400 mg total) by mouth 2 (two) times daily.   furosemide 40 MG tablet Commonly known as:  LASIX Take one tablet (40mg ) by mouth in the morning daily.  Take 1/2 tablet (20mg ) by  mouth in the afternoon daily.   levothyroxine 50 MCG tablet Commonly known as:  SYNTHROID, LEVOTHROID Take 1 tablet (50 mcg total) by mouth daily before breakfast.   lovastatin 40 MG tablet Commonly known as:  MEVACOR Take 2 tablets (80 mg total) by mouth at bedtime.   metoprolol tartrate 25 MG tablet Commonly known as:  LOPRESSOR Take 1 tablet (25 mg total) by mouth 2 (two) times daily.   omeprazole 40 MG capsule Commonly known as:  PRILOSEC Take 1 capsule (40 mg total) by mouth daily.   potassium chloride SA 20 MEQ tablet Commonly known as:  K-DUR,KLOR-CON TAKE 1 TABLET BY MOUTH TWO   TIMES DAILY   raloxifene 60 MG tablet Commonly known as:  EVISTA Take 1 tablet (60 mg total) by mouth daily.   Rivaroxaban 15 MG Tabs tablet Commonly known as:  XARELTO Take 1 tablet (15 mg total) by mouth daily with supper.   traMADol 50 MG tablet Commonly known as:  ULTRAM TAKE 1 TABLET BY MOUTH TWICE DAILY AS NEEDED FOR MODERATE PAIN OR SEVERE PAIN   Vitamin D 2000 units Caps Take 2,000 Units by mouth daily.          Objective:    BP (!) 188/65   Pulse (!) 51   Temp 99.8 F (37.7 C) (Oral)   Wt 166 lb (75.3 kg)   BMI 26.00 kg/m   Wt Readings from Last 3 Encounters:  12/16/17 166 lb (75.3 kg)  11/11/17 169 lb (76.7 kg)  09/22/17 170 lb (77.1 kg)    Physical Exam  Constitutional: She is oriented to person, place, and time. She appears well-developed and well-nourished. No distress.  HENT:  Right Ear: Tympanic membrane, external ear and ear canal normal.  Left Ear: Tympanic membrane, external ear and ear canal normal.  Nose: Mucosal edema and rhinorrhea present. No epistaxis. Right sinus exhibits maxillary sinus tenderness. Right sinus exhibits no frontal sinus tenderness. Left sinus exhibits maxillary sinus tenderness. Left sinus exhibits no frontal sinus tenderness.  Mouth/Throat: Uvula is midline and mucous membranes are normal. Posterior oropharyngeal edema present. No oropharyngeal exudate, posterior oropharyngeal erythema or tonsillar abscesses.  Eyes: Conjunctivae and EOM are normal.  Cardiovascular: Normal rate, regular rhythm, normal heart sounds and intact distal pulses.  No murmur heard. Pulmonary/Chest: Effort normal and breath sounds normal. No respiratory distress. She has no wheezes.  Musculoskeletal: Normal range of motion. She exhibits no edema or tenderness.  Neurological: She is alert and oriented to person, place, and time. Coordination normal.  Skin: Skin is warm and dry. No rash noted. She is not diaphoretic.  Psychiatric: She has a normal mood  and affect. Her behavior is normal.  Nursing note and vitals reviewed.       Assessment & Plan:   Problem List Items Addressed This Visit    None    Visit Diagnoses    Acute recurrent maxillary sinusitis    -  Primary   Relevant Medications   azithromycin (ZITHROMAX) 250 MG tablet   benzonatate (TESSALON) 100 MG capsule       Follow up plan: Return if symptoms worsen or fail to improve.  Counseling provided for all of the vaccine components No orders of the defined types were placed in this encounter.   Caryl Pina, MD [facility] 12/16/2017, 4:06 PM

## 2017-12-21 ENCOUNTER — Ambulatory Visit (INDEPENDENT_AMBULATORY_CARE_PROVIDER_SITE_OTHER): Payer: Medicare Other | Admitting: Nurse Practitioner

## 2017-12-21 ENCOUNTER — Encounter: Payer: Self-pay | Admitting: Nurse Practitioner

## 2017-12-21 VITALS — BP 148/61 | HR 49 | Temp 98.1°F | Ht 67.0 in | Wt 167.0 lb

## 2017-12-21 DIAGNOSIS — R609 Edema, unspecified: Secondary | ICD-10-CM

## 2017-12-21 DIAGNOSIS — E876 Hypokalemia: Secondary | ICD-10-CM

## 2017-12-21 DIAGNOSIS — N183 Chronic kidney disease, stage 3 unspecified: Secondary | ICD-10-CM

## 2017-12-21 DIAGNOSIS — I5032 Chronic diastolic (congestive) heart failure: Secondary | ICD-10-CM | POA: Diagnosis not present

## 2017-12-21 DIAGNOSIS — E032 Hypothyroidism due to medicaments and other exogenous substances: Secondary | ICD-10-CM

## 2017-12-21 DIAGNOSIS — E119 Type 2 diabetes mellitus without complications: Secondary | ICD-10-CM

## 2017-12-21 DIAGNOSIS — I4819 Other persistent atrial fibrillation: Secondary | ICD-10-CM

## 2017-12-21 DIAGNOSIS — R600 Localized edema: Secondary | ICD-10-CM

## 2017-12-21 DIAGNOSIS — E785 Hyperlipidemia, unspecified: Secondary | ICD-10-CM

## 2017-12-21 DIAGNOSIS — Z6826 Body mass index (BMI) 26.0-26.9, adult: Secondary | ICD-10-CM

## 2017-12-21 DIAGNOSIS — I10 Essential (primary) hypertension: Secondary | ICD-10-CM

## 2017-12-21 DIAGNOSIS — K219 Gastro-esophageal reflux disease without esophagitis: Secondary | ICD-10-CM

## 2017-12-21 DIAGNOSIS — K5909 Other constipation: Secondary | ICD-10-CM

## 2017-12-21 DIAGNOSIS — I481 Persistent atrial fibrillation: Secondary | ICD-10-CM

## 2017-12-21 DIAGNOSIS — J301 Allergic rhinitis due to pollen: Secondary | ICD-10-CM

## 2017-12-21 DIAGNOSIS — M8580 Other specified disorders of bone density and structure, unspecified site: Secondary | ICD-10-CM

## 2017-12-21 LAB — BAYER DCA HB A1C WAIVED: HB A1C: 6.6 % (ref <7.0)

## 2017-12-21 MED ORDER — BENAZEPRIL HCL 40 MG PO TABS: ORAL_TABLET | ORAL | 1 refills | Status: DC

## 2017-12-21 MED ORDER — LEVOTHYROXINE SODIUM 50 MCG PO TABS: 50.0000 ug | ORAL_TABLET | Freq: Every day | ORAL | 2 refills | Status: DC

## 2017-12-21 MED ORDER — RALOXIFENE HCL 60 MG PO TABS: 60.0000 mg | ORAL_TABLET | Freq: Every day | ORAL | 1 refills | Status: DC

## 2017-12-21 MED ORDER — FLUTICASONE PROPIONATE 50 MCG/ACT NA SUSP: 2.0000 | Freq: Every day | NASAL | 6 refills | Status: DC

## 2017-12-21 MED ORDER — FUROSEMIDE 40 MG PO TABS: ORAL_TABLET | ORAL | 1 refills | Status: DC

## 2017-12-21 NOTE — Patient Instructions (Signed)

## 2017-12-21 NOTE — Progress Notes (Signed)
Subjective:    Patient ID: Katie [name], female    DOB: February 01, 1932, 84 y.o.   MRN: [id number]   Chief Complaint: medical management of chronic issues  HPI:  1. Essential hypertension  No c/o chest pain, sob or headaches. Does not check blood pressure at home BP Readings from Last 3 Encounters:  12/21/17 (!) 148/61  12/16/17 (!) 177/67  12/09/17 (!) 176/71     2. Diabetes mellitus type 2, diet-controlled (HCC)  Last HGBA1c was 6.7%. She does not check her blood sugars everyday. She denies any signs of hypoglycemia.  3. Hyperlipidemia, unspecified hyperlipidemia type  Does not really watch diet  4. Persistent atrial fibrillation ([place])  she denies any palpitations. She is on xeralto without in bleeding problems. Cardiologist thinks she had an episode of atrial fib last year when she felt bad and had nausea and vomiting.  5. Chronic diastolic CHF (congestive heart failure) ([place])  Last saw cardiology 04/27/17. According to her office note, no changes were made to plan.  6. Gastroesophageal reflux disease without esophagitis  Patient takes omeprazole daily which works well to keep symptoms under control.  7. Hypothyroidism due to non-medication exogenous substances  No problems that she is aware of.  8. Osteopenia, senile  Last dexascan was done on 09/22/17 with t score of -1.8. She does very little weight bearing exercise.  9. CKD (chronic kidney disease) stage 3, GFR 30-59 ml/min (HCC)  We are currently watching labs  10. Peripheral edema  She has swelling at end of day in lower ext.  11. Hypokalemia  She denies any lower ext cramping  12. Other constipation  Has occasional constipation. She usually treats OTC.  13. BMI 26.0-26.9,adult  No recent weight changes    Outpatient Encounter Medications as of 12/21/2017  Medication Sig  . azithromycin (ZITHROMAX) 250 MG tablet Take 2 the first day and then one each day after.  . benazepril (LOTENSIN) 40 MG tablet TAKE 1 & 1/2 (ONE &  ONE-HALF) TABLETS BY MOUTH ONCE DAILY  . benzonatate (TESSALON) 100 MG capsule Take 1 capsule (100 mg total) by mouth 2 (two) times daily as needed for cough.  . calcium carbonate (OS-CAL) 600 MG TABS Take 600 mg by mouth daily.   . Cholecalciferol (VITAMIN D) 2000 UNITS CAPS Take 2,000 Units by mouth daily.   [name] etodolac (LODINE) 400 MG tablet Take 1 tablet (400 mg total) by mouth 2 (two) times daily.  . furosemide (LASIX) 40 MG tablet Take one tablet (89m) by mouth in the morning daily.  Take 1/2 tablet (240m by mouth in the afternoon daily.  . [name] (SYNTHROID, LEVOTHROID) 50 MCG tablet Take 1 tablet (50 mcg total) by mouth daily before breakfast.  . lovastatin (MEVACOR) 40 MG tablet Take 2 tablets (80 mg total) by mouth at bedtime.  . metoprolol tartrate (LOPRESSOR) 25 MG tablet Take 1 tablet (25 mg total) by mouth 2 (two) times daily.  . [name] (PRILOSEC) 40 MG capsule Take 1 capsule (40 mg total) by mouth daily.  . potassium chloride SA (K-DUR,KLOR-CON) 20 MEQ tablet TAKE 1 TABLET BY MOUTH TWO  TIMES DAILY  . raloxifene (EVISTA) 60 MG tablet Take 1 tablet (60 mg total) by mouth daily.  . Rivaroxaban (XARELTO) 15 MG TABS tablet Take 1 tablet (15 mg total) by mouth daily with supper.  . traMADol (ULTRAM) 50 MG tablet TAKE 1 TABLET BY MOUTH TWICE DAILY AS NEEDED FOR MODERATE PAIN OR SEVERE PAIN  New complaints: Ws seen last week with sinus infection- she is still stopped up despite taking z pak.  Social history: Is the primary caregiver of her husband and it keeps her very busy. She has to help with his care at times.    Review of Systems  Constitutional: Negative for activity change and appetite change.  HENT: Negative.   Eyes: Negative for pain.  Respiratory: Negative for shortness of breath.   Cardiovascular: Negative for chest pain, palpitations and leg swelling.  Gastrointestinal: Negative for abdominal pain.  Endocrine: Negative for polydipsia.    Genitourinary: Negative.   Skin: Negative for rash.  Neurological: Negative for dizziness, weakness and headaches.  Hematological: Does not bruise/bleed easily.  Psychiatric/Behavioral: Negative.   All other systems reviewed and are negative.      Objective:   Physical Exam  Constitutional: She is oriented to person, place, and time.  HENT:  Head: Normocephalic.  Right Ear: Hearing, tympanic membrane, external ear and ear canal normal.  Left Ear: Hearing, tympanic membrane, external ear and ear canal normal.  Nose: Mucosal edema and rhinorrhea present. Right sinus exhibits no maxillary sinus tenderness and no frontal sinus tenderness. Left sinus exhibits no maxillary sinus tenderness and no frontal sinus tenderness.  Mouth/Throat: Uvula is midline and oropharynx is clear and moist.  Eyes: Pupils are equal, round, and reactive to light. EOM are normal.  Neck: Normal range of motion. Neck supple. No JVD present. Carotid bruit is not present.  Cardiovascular: Normal rate, regular rhythm, normal heart sounds and intact distal pulses.  Pulmonary/Chest: Effort normal and breath sounds normal. No respiratory distress. She has no wheezes. She has no rales. She exhibits no tenderness.  Abdominal: Soft. Normal appearance, normal aorta and bowel sounds are normal. She exhibits no distension, no abdominal bruit, no pulsatile midline mass and no mass. There is no splenomegaly or hepatomegaly. There is no tenderness.  Musculoskeletal: Normal range of motion. She exhibits no edema.  Lymphadenopathy:    She has no cervical adenopathy.  Neurological: She is alert and oriented to person, place, and time. She has normal reflexes.  Skin: Skin is warm and dry.  Psychiatric: Judgment normal.   BP (!) 148/61   Pulse (!) 49   Temp 98.1 F (36.7 C) (Oral)   Ht '5\' 7"'  (1.702 m)   Wt 167 lb (75.8 kg)   BMI 26.16 kg/m   hgba1c 6.6      Assessment & Plan:  ENVY MENO comes in today with chief  complaint of Medical Management of Chronic Issues   Diagnosis and orders addressed:  1. Essential hypertension Low sodium diet - CMP14+EGFR - benazepril (LOTENSIN) 40 MG tablet; TAKE 1 & 1/2 (ONE & ONE-HALF) TABLETS BY MOUTH ONCE DAILY  Dispense: 180 tablet; Refill: 1  2. Diabetes mellitus type 2, diet-controlled (HCC) Continue carbs in diet - Bayer DCA Hb A1c Waived  3. Hyperlipidemia, unspecified hyperlipidemia type Low fat diet - Lipid panel  4. Persistent atrial fibrillation (HCC) Keep diary of episodes when they occur  5. Chronic diastolic CHF (congestive heart failure) (HCC) Limit fluid intake  6. Gastroesophageal reflux disease without esophagitis Avoid spicy foods Do not eat 2 hours prior to bedtime  7. Hypothyroidism due to non-medication exogenous substances - levothyroxine (SYNTHROID, LEVOTHROID) 50 MCG tablet; Take 1 tablet (50 mcg total) by mouth daily before breakfast.  Dispense: 90 tablet; Refill: 2  8. Osteopenia, senile Weight bearing exercise when can tolerate - raloxifene (EVISTA) 60 MG tablet; Take 1  tablet (60 mg total) by mouth daily.  Dispense: 90 tablet; Refill: 1  9. CKD (chronic kidney disease) stage 3, GFR 30-59 ml/min (HCC) Labs pending  10. Peripheral edema elevate legs when sitting - furosemide (LASIX) 40 MG tablet; Take one tablet (52m) by mouth in the morning daily.  Take 1/2 tablet (257m by mouth in the afternoon daily.  Dispense: 135 tablet; Refill: 1  11. Hypokalemia labspending  12. Other constipation miralx OTC as needed Increase fiber in diet  13. BMI 26.0-26.9,adult Discussed diet and exercise for person with BMI >25 Will recheck weight in 3-6 months  14. Seasonal allergic rhinitis due to pollen Avoid allergens Wear a mask when outside - fluticasone (FLONASE) 50 MCG/ACT nasal spray; Place 2 sprays into both nostrils daily.  Dispense: 16 g; Refill: 6   Labs pending Health Maintenance reviewed Diet and exercise  encouraged  Follow up plan: 3 months   Mary-Margaret MaHassell DoneFNP

## 2017-12-22 LAB — CMP14+EGFR
ALBUMIN: 3.6 g/dL (ref 3.5–4.7)
ALK PHOS: 64 IU/L (ref 39–117)
ALT: 16 IU/L (ref 0–32)
AST: 16 IU/L (ref 0–40)
Albumin/Globulin Ratio: 1.5 (ref 1.2–2.2)
BILIRUBIN TOTAL: 0.4 mg/dL (ref 0.0–1.2)
BUN/Creatinine Ratio: 23 (ref 12–28)
BUN: 36 mg/dL — ABNORMAL HIGH (ref 8–27)
CO2: 23 mmol/L (ref 20–29)
CREATININE: 1.58 mg/dL — AB (ref 0.57–1.00)
Calcium: 8.7 mg/dL (ref 8.7–10.3)
Chloride: 102 mmol/L (ref 96–106)
GFR, EST AFRICAN AMERICAN: 34 mL/min/{1.73_m2} — AB (ref >59)
GFR, EST NON AFRICAN AMERICAN: 30 mL/min/{1.73_m2} — AB (ref >59)
GLUCOSE: 120 mg/dL — AB (ref 65–99)
Globulin, Total: 2.4 g/dL (ref 1.5–4.5)
POTASSIUM: 4.6 mmol/L (ref 3.5–5.2)
Sodium: 139 mmol/L (ref 134–144)
Total Protein: 6 g/dL (ref 6.0–8.5)

## 2017-12-22 LAB — LIPID PANEL
CHOL/HDL RATIO: 2.3 ratio (ref 0.0–4.4)
Cholesterol, Total: 139 mg/dL (ref 100–199)
HDL: 61 mg/dL (ref >39)
LDL CALC: 58 mg/dL (ref 0–99)
TRIGLYCERIDES: 101 mg/dL (ref 0–149)
VLDL Cholesterol Cal: 20 mg/dL (ref 5–40)

## 2017-12-22 NOTE — Procedures (Signed)
Lumbosacral Transforaminal Epidural Steroid Injection - Sub-Pedicular Approach with Fluoroscopic Guidance  Patient: Katie [name]      Date of Birth: 06-26-1932 MRN: [id number] PCP: [name], FNP      Visit Date: 12/09/2017   Universal Protocol:    Date/Time: 12/09/2017  Consent Given By: the patient  Position: PRONE  Additional Comments: Vital signs were monitored before and after the procedure. Patient was prepped and draped in the usual sterile fashion. The correct patient, procedure, and site was verified.   Injection Procedure Details:  Procedure Site One Meds Administered:  Meds ordered this encounter  Medications  . betamethasone acetate-betamethasone sodium phosphate (CELESTONE) injection 12 mg    Laterality: Left  Location/Site:  L3-L4 L4-L5  Needle size: 22 G  Needle type: Spinal  Needle Placement: Transforaminal  Findings:    -Comments: Excellent flow of contrast along the nerve and into the epidural space.  Procedure Details: After squaring off the end-plates to get a true AP view, the C-arm was positioned so that an oblique view of the foramen as noted above was visualized. The target area is just inferior to the "nose of the scotty dog" or sub pedicular. The soft tissues overlying this structure were infiltrated with 2-3 ml. of 1% Lidocaine without Epinephrine.  The spinal needle was inserted toward the target using a "trajectory" view along the fluoroscope beam.  Under AP and lateral visualization, the needle was advanced so it did not puncture dura and was located close the 6 O'Clock position of the pedical in AP tracterory. Biplanar projections were used to confirm position. Aspiration was confirmed to be negative for CSF and/or blood. A 1-2 ml. volume of Isovue-250 was injected and flow of contrast was noted at each level. Radiographs were obtained for documentation purposes.   After attaining the desired flow of contrast documented  above, a 0.5 to 1.0 ml test dose of 0.25% Marcaine was injected into each respective transforaminal space.  The patient was observed for 90 seconds post injection.  After no sensory deficits were reported, and normal lower extremity motor function was noted,   the above injectate was administered so that equal amounts of the injectate were placed at each foramen (level) into the transforaminal epidural space.   Additional Comments:  The patient tolerated the procedure well Dressing: Band-Aid    Post-procedure details: Patient was observed during the procedure. Post-procedure instructions were reviewed.  Patient left the clinic in stable condition.

## 2017-12-22 NOTE — Progress Notes (Signed)
Katie Katie [name] - 84 y.o. female MRN [id number]  Date of birth: 1932-07-22  Office Visit Note: Visit Date: 12/09/2017 PCP: Katie Pretty, FNP Referred by: Katie Katie [name], Katie Katie [name], *  Subjective: Chief Complaint  Katie [name] presents with  . Lower Back - Pain   HPI: Katie Katie [name] is an 84 year old female with chronic worsening left radicular leg pain.  This been ongoing for months.  No real numbness or tingling or focal weakness.  Katie Katie [name] does take Ultram as needed for pain.  Katie Katie [name] does use Xarelto anticoagulation but quit taking this on 12/05/2017.  Katie Katie [name] comes in today at Dr. Otho [name] request for diagnostic and therapeutic left L3 and L4 transforaminal epidural steroid injection.  Katie injection  will be diagnostic and hopefully therapeutic. Katie Katie [name] has failed conservative care including time, medications and activity modification.   ROS Otherwise per HPI.  Assessment & Plan: Visit Diagnoses:  1. Lumbar radiculopathy     Plan: No additional findings.   Meds & Orders:  Meds ordered this encounter  Medications  . betamethasone acetate-betamethasone sodium phosphate (CELESTONE) injection 12 mg    Orders Placed This Encounter  Procedures  . XR C-ARM NO REPORT  . Epidural Steroid injection    Follow-up: Return for Dr. Louanne [name].   Procedures: No procedures performed  Lumbosacral Transforaminal Epidural Steroid Injection - Sub-Pedicular Approach with Fluoroscopic Guidance  Katie [name]: Katie Katie [name]      Date of Birth: Jan 13, 1932 MRN: [id number] PCP: Katie Pretty, FNP      Visit Date: 12/09/2017   Universal Protocol:    Date/Time: 12/09/2017  Consent Given By: Katie Katie [name]  Position: PRONE  Additional Comments: Vital signs were monitored before and after Katie procedure. Katie [name] was prepped and draped in Katie usual sterile fashion. Katie correct Katie [name], procedure, and site was verified.   Injection Procedure Details:  Procedure Site One Meds Administered:  Meds ordered this  encounter  Medications  . betamethasone acetate-betamethasone sodium phosphate (CELESTONE) injection 12 mg    Laterality: Left  Location/Site:  L3-L4 L4-L5  Needle size: 22 G  Needle type: Spinal  Needle Placement: Transforaminal  Findings:    -Comments: Excellent flow of contrast along Katie nerve and into Katie epidural space.  Procedure Details: After squaring off Katie end-plates to get a true AP view, Katie C-arm was positioned so that an oblique view of Katie foramen as noted above was visualized. Katie target area is just inferior to Katie "nose of Katie scotty dog" or sub pedicular. Katie soft tissues overlying this structure were infiltrated with 2-3 ml. of 1% Lidocaine without Epinephrine.  Katie spinal needle was inserted toward Katie target using a "trajectory" view along Katie fluoroscope beam.  Under AP and lateral visualization, Katie needle was advanced so it did not puncture dura and was located close Katie 6 O'Clock position of Katie pedical in AP tracterory. Biplanar projections were used to confirm position. Aspiration was confirmed to be negative for CSF and/or blood. A 1-2 ml. volume of Isovue-250 was injected and flow of contrast was noted at each level. Radiographs were obtained for documentation purposes.   After attaining Katie desired flow of contrast documented above, a 0.5 to 1.0 ml test dose of 0.25% Marcaine was injected into each respective transforaminal space.  Katie Katie [name] was observed for 90 seconds post injection.  After no sensory deficits were reported, and normal lower extremity motor function was noted,   Katie above injectate was administered so that equal amounts of Katie injectate were placed at each foramen (  level) into Katie transforaminal epidural space.   Additional Comments:  Katie Katie [name] tolerated Katie procedure well Dressing: Band-Aid    Post-procedure details: Katie [name] was observed during Katie procedure. Post-procedure instructions were reviewed.  Katie [name] left Katie  clinic in stable condition.    Clinical History: No specialty comments available.   Katie Katie [name] reports that Katie Katie [name] has never smoked. Katie Katie [name] has never used smokeless tobacco. No results for input(s): HGBA1C, LABURIC in Katie last 8760 hours.  Objective:  VS:  HT:    WT:   BMI:     BP:(!) 176/71  HR:(!) 54bpm  TEMP: ( )  RESP:  Physical Exam  Ortho Exam Imaging: No results found.  Past Medical/Family/Surgical/Social History: Medications & Allergies reviewed per EMR, new medications updated. Katie [name] Active Problem List   Diagnosis Date Noted  . Persistent atrial fibrillation ([place]) 04/27/2017  . Chronic diastolic CHF (congestive heart failure) ([place]) 04/27/2017  . CKD (chronic kidney disease) stage 3, GFR 30-59 ml/min (HCC) 05/14/2015  . BMI 26.0-26.9,adult 05/11/2015  . Peripheral edema 07/06/2014  . Hypokalemia 07/22/2013  . Hypothyroidism 07/22/2013  . Osteopenia, senile 03/09/2013  . Constipation 01/04/2013  . Hypertension 05/07/2012  . Hyperlipidemia 05/07/2012  . Diabetes mellitus type 2, diet-controlled ([place]) 05/07/2012  . GERD (gastroesophageal reflux disease) 05/07/2012   Past Medical History:  Diagnosis Date  . Aortic insufficiency    a. Trivial AI by echo 02/2016.  [name] Atrial fibrillation and flutter ([place])    a. Coarse afib vs flutter by EKG 12/2015.  . Cataract   . Chronic diastolic CHF (congestive heart failure) ([place])   . CKD (chronic kidney disease), stage III ([place])   . Edema   . GERD (gastroesophageal reflux disease)   . Hiatal hernia   . Hypercholesterolemia   . Hypertension   . Hypokalemia   . Hypothyroidism   . Left knee pain   . NIDDM (non-insulin dependent diabetes mellitus)    diet controlled   . Obesity   . Premature atrial contractions   . PVC's (premature ventricular contractions)   . URI (upper respiratory infection)   . Vitamin D deficiency    Family History  Problem Relation Age of Onset  . Diabetes Mother   . Stroke Mother   . Heart disease  Father   . Uterine cancer Sister   . Diabetes Sister   . Ovarian cancer Sister   . Colon cancer Sister   . Diabetes Sister   . Liver cancer Sister        \  . Diabetes Brother   . Dementia Brother   . Atrial fibrillation Sister   . Diabetes Sister   . Diabetes Son   . Heart attack Neg Hx   . Hypertension Neg Hx    Past Surgical History:  Procedure Laterality Date  . APPENDECTOMY  1980  . BACK SURGERY    . CATARACT EXTRACTION, BILATERAL    . CHOLECYSTECTOMY  5/00  . COLONOSCOPY    . TONSILLECTOMY    . TOTAL ABDOMINAL HYSTERECTOMY W/ BILATERAL SALPINGOOPHORECTOMY  1980  . UPPER GASTROINTESTINAL ENDOSCOPY     Social History   Occupational History  . Occupation: Retired  Tobacco Use  . Smoking status: Never Smoker  . Smokeless tobacco: Never Used  Substance and Sexual Activity  . Alcohol use: No  . Drug use: No  . Sexual activity: Never

## 2018-01-12 ENCOUNTER — Other Ambulatory Visit: Payer: Self-pay | Admitting: Nurse Practitioner

## 2018-01-12 ENCOUNTER — Telehealth: Payer: Self-pay | Admitting: Nurse Practitioner

## 2018-01-12 DIAGNOSIS — E032 Hypothyroidism due to medicaments and other exogenous substances: Secondary | ICD-10-CM

## 2018-01-12 DIAGNOSIS — K219 Gastro-esophageal reflux disease without esophagitis: Secondary | ICD-10-CM

## 2018-01-12 DIAGNOSIS — I10 Essential (primary) hypertension: Secondary | ICD-10-CM

## 2018-01-12 DIAGNOSIS — E782 Mixed hyperlipidemia: Secondary | ICD-10-CM

## 2018-01-12 NOTE — Telephone Encounter (Signed)
Pt aware of all labs 

## 2018-01-26 DIAGNOSIS — D0461 Carcinoma in situ of skin of right upper limb, including shoulder: Secondary | ICD-10-CM | POA: Diagnosis not present

## 2018-01-26 DIAGNOSIS — D044 Carcinoma in situ of skin of scalp and neck: Secondary | ICD-10-CM | POA: Diagnosis not present

## 2018-01-26 DIAGNOSIS — L570 Actinic keratosis: Secondary | ICD-10-CM | POA: Diagnosis not present

## 2018-02-01 DIAGNOSIS — D3131 Benign neoplasm of right choroid: Secondary | ICD-10-CM | POA: Diagnosis not present

## 2018-02-01 DIAGNOSIS — H47092 Other disorders of optic nerve, not elsewhere classified, left eye: Secondary | ICD-10-CM | POA: Diagnosis not present

## 2018-02-01 DIAGNOSIS — H43393 Other vitreous opacities, bilateral: Secondary | ICD-10-CM | POA: Diagnosis not present

## 2018-02-01 DIAGNOSIS — H43811 Vitreous degeneration, right eye: Secondary | ICD-10-CM | POA: Diagnosis not present

## 2018-02-16 ENCOUNTER — Telehealth: Payer: Self-pay | Admitting: Nurse Practitioner

## 2018-02-16 DIAGNOSIS — E876 Hypokalemia: Secondary | ICD-10-CM

## 2018-02-16 MED ORDER — POTASSIUM CHLORIDE CRYS ER 20 MEQ PO TBCR: 20.0000 meq | EXTENDED_RELEASE_TABLET | Freq: Two times a day (BID) | ORAL | 0 refills | Status: DC

## 2018-02-16 NOTE — Telephone Encounter (Signed)
Refill sent to pharmacy.   

## 2018-02-23 ENCOUNTER — Telehealth: Payer: Self-pay | Admitting: Nurse Practitioner

## 2018-02-23 DIAGNOSIS — E876 Hypokalemia: Secondary | ICD-10-CM

## 2018-02-23 MED ORDER — POTASSIUM CHLORIDE CRYS ER 20 MEQ PO TBCR: 20.0000 meq | EXTENDED_RELEASE_TABLET | Freq: Two times a day (BID) | ORAL | 0 refills | Status: DC

## 2018-02-23 NOTE — Telephone Encounter (Signed)
TC to pt's mail order. It was received but there was info that needed verification. This will be processed. Pt aware & 2 weeks sent to [facility]

## 2018-02-23 NOTE — Telephone Encounter (Signed)
What is the name of the medication? potassium  Have you contacted your pharmacy to request a refill? Has called her over a week ago to sent to mail order pharmacy and it has not been done she is about to run out needs it sent to [place] would you like this sent to? [facility] [place]   Patient notified that their request is being sent to the clinical staff for review and that they should receive a call once it is complete. If they do not receive a call within 24 hours they can check with their pharmacy or our office.

## 2018-02-24 ENCOUNTER — Other Ambulatory Visit: Payer: Self-pay | Admitting: Nurse Practitioner

## 2018-03-15 ENCOUNTER — Other Ambulatory Visit: Payer: Self-pay | Admitting: Nurse Practitioner

## 2018-03-15 DIAGNOSIS — M8580 Other specified disorders of bone density and structure, unspecified site: Secondary | ICD-10-CM

## 2018-03-30 DIAGNOSIS — L989 Disorder of the skin and subcutaneous tissue, unspecified: Secondary | ICD-10-CM | POA: Diagnosis not present

## 2018-03-30 DIAGNOSIS — L821 Other seborrheic keratosis: Secondary | ICD-10-CM | POA: Diagnosis not present

## 2018-03-30 DIAGNOSIS — D1801 Hemangioma of skin and subcutaneous tissue: Secondary | ICD-10-CM | POA: Diagnosis not present

## 2018-03-30 DIAGNOSIS — Z85828 Personal history of other malignant neoplasm of skin: Secondary | ICD-10-CM | POA: Diagnosis not present

## 2018-03-30 DIAGNOSIS — D485 Neoplasm of uncertain behavior of skin: Secondary | ICD-10-CM | POA: Diagnosis not present

## 2018-03-31 ENCOUNTER — Ambulatory Visit: Payer: Medicare Other | Admitting: Nurse Practitioner

## 2018-04-05 ENCOUNTER — Ambulatory Visit (INDEPENDENT_AMBULATORY_CARE_PROVIDER_SITE_OTHER): Payer: Medicare Other | Admitting: Nurse Practitioner

## 2018-04-05 ENCOUNTER — Encounter: Payer: Self-pay | Admitting: Nurse Practitioner

## 2018-04-05 VITALS — BP 152/69 | HR 50 | Temp 98.2°F | Ht 67.0 in | Wt 167.0 lb

## 2018-04-05 DIAGNOSIS — I10 Essential (primary) hypertension: Secondary | ICD-10-CM

## 2018-04-05 DIAGNOSIS — I481 Persistent atrial fibrillation: Secondary | ICD-10-CM | POA: Diagnosis not present

## 2018-04-05 DIAGNOSIS — E785 Hyperlipidemia, unspecified: Secondary | ICD-10-CM

## 2018-04-05 DIAGNOSIS — E119 Type 2 diabetes mellitus without complications: Secondary | ICD-10-CM | POA: Diagnosis not present

## 2018-04-05 DIAGNOSIS — N183 Chronic kidney disease, stage 3 unspecified: Secondary | ICD-10-CM

## 2018-04-05 DIAGNOSIS — E032 Hypothyroidism due to medicaments and other exogenous substances: Secondary | ICD-10-CM | POA: Diagnosis not present

## 2018-04-05 DIAGNOSIS — M8580 Other specified disorders of bone density and structure, unspecified site: Secondary | ICD-10-CM

## 2018-04-05 DIAGNOSIS — R609 Edema, unspecified: Secondary | ICD-10-CM

## 2018-04-05 DIAGNOSIS — K5909 Other constipation: Secondary | ICD-10-CM

## 2018-04-05 DIAGNOSIS — M545 Low back pain, unspecified: Secondary | ICD-10-CM

## 2018-04-05 DIAGNOSIS — I4819 Other persistent atrial fibrillation: Secondary | ICD-10-CM

## 2018-04-05 DIAGNOSIS — K219 Gastro-esophageal reflux disease without esophagitis: Secondary | ICD-10-CM | POA: Diagnosis not present

## 2018-04-05 DIAGNOSIS — I5032 Chronic diastolic (congestive) heart failure: Secondary | ICD-10-CM | POA: Diagnosis not present

## 2018-04-05 DIAGNOSIS — E876 Hypokalemia: Secondary | ICD-10-CM

## 2018-04-05 DIAGNOSIS — Z6826 Body mass index (BMI) 26.0-26.9, adult: Secondary | ICD-10-CM

## 2018-04-05 LAB — BAYER DCA HB A1C WAIVED: HB A1C (BAYER DCA - WAIVED): 6.4 % (ref <7.0)

## 2018-04-05 MED ORDER — BENAZEPRIL HCL 40 MG PO TABS: ORAL_TABLET | ORAL | 1 refills | Status: DC

## 2018-04-05 MED ORDER — TRAMADOL HCL 50 MG PO TABS: ORAL_TABLET | ORAL | 2 refills | Status: DC

## 2018-04-05 MED ORDER — FUROSEMIDE 40 MG PO TABS: ORAL_TABLET | ORAL | 1 refills | Status: DC

## 2018-04-05 MED ORDER — POTASSIUM CHLORIDE CRYS ER 20 MEQ PO TBCR: 20.0000 meq | EXTENDED_RELEASE_TABLET | Freq: Two times a day (BID) | ORAL | 0 refills | Status: DC

## 2018-04-05 MED ORDER — RALOXIFENE HCL 60 MG PO TABS: 60.0000 mg | ORAL_TABLET | Freq: Every day | ORAL | 1 refills | Status: DC

## 2018-04-05 NOTE — Patient Instructions (Signed)

## 2018-04-05 NOTE — Progress Notes (Signed)
Subjective:    Patient ID: Katie [name], female    DOB: 10-02-1931, 84 y.o.   MRN: [id number]   Chief Complaint: Medical Management of Chronic Issues  HPI:  1. Persistent atrial fibrillation [facility])  Patient denies any palpitations or feeling like heart is racing. She is on xeralto daily. No bleeding that sh ei aware of.  2. Essential hypertension  No c/o chest pain, sob or headache. Does not check blood pressure at home. BP Readings from Last 3 Encounters:  12/21/17 (!) 148/61  12/16/17 (!) 177/67  12/09/17 (!) 176/71     3. Chronic diastolic CHF (congestive heart failure) ([place])  Last saw cardiology in sept 2018. She goes yearly- doing well. No changes were made to plan of care according to last cardiology note.  4. Gastroesophageal reflux disease without esophagitis  Takes omeprazole daily.  5. Hypothyroidism due to non-medication exogenous substances  No problems that she is aware of.  6. Diabetes mellitus type 2, diet-controlled ([place])  Last hgba1c was 6.6. She is currently just doing diet control  7. Osteopenia, senile  Last BMD test 09/22/17 with tscore of -1.8. Is currently on evista and vitamin d calcium suplement. Does no weight bearing exercises  8. CKD (chronic kidney disease) stage 3, GFR 30-59 ml/min (HCC)  We are currently just watching labs.  9. Peripheral edema  Has daily swelling- resolves some during the night  10. Hypokalemia  Takes a daily calcium supplement  11. Hyperlipidemia, unspecified hyperlipidemia type  Does no really watch diet at all  12. Other constipation  Comes and goes  13. BMI 26.0-26.9,adult  No recent weight changes  14.    Chronic low back pain          Pain assessment: Cause of pain- lifting and pulling on her husband Pain location- low back Pain on scale of 1-10- 4-510 Frequency- daily What increases pain-lots of tugging and pulling What makes pain Better-rest Effects on ADL - still has to do what she needs to do Any change in  general medical condition-none  Current medications- ultram 78m 1-2 a day Effectiveness of current meds-helps  Adverse reactions form pain meds-none Morphine equivalent-<20  Pill count performed-No Urine drug screen- No Was the [place] Day[place]- yes  If yes were their any concerning findings? - none  Pain contract signed on:04/05/18  Outpatient Encounter Medications as of 04/05/2018  Medication Sig  . benazepril (LOTENSIN) 40 MG tablet TAKE 1 & 1/2 (ONE & ONE-HALF) TABLETS BY MOUTH ONCE DAILY  . calcium carbonate (OS-CAL) 600 MG TABS Take 600 mg by mouth daily.   . Cholecalciferol (VITAMIN D) 2000 UNITS CAPS Take 2,000 Units by mouth daily.   .Marland Kitchenetodolac (LODINE) 400 MG tablet Take 1 tablet (400 mg total) by mouth 2 (two) times daily.  . fluticasone (FLONASE) 50 MCG/ACT nasal spray Place 2 sprays into both nostrils daily.  . furosemide (LASIX) 40 MG tablet Take one tablet (445m by mouth in the morning daily.  Take 1/2 tablet (2042mby mouth in the afternoon daily.  . lMarland Kitchenvothyroxine (SYNTHROID, LEVOTHROID) 50 MCG tablet Take 1 tablet (50 mcg total) by mouth daily before breakfast.  . lovastatin (MEVACOR) 40 MG tablet Take 2 tablets (80 mg total) by mouth at bedtime.  . lovastatin (MEVACOR) 40 MG tablet TAKE 2 TABLETS BY MOUTH AT  BEDTIME  . metoprolol tartrate (LOPRESSOR) 25 MG tablet Take 1 tablet (25 mg total) by mouth 2 (two) times daily.  . metoprolol tartrate (LOPRESSOR) 25  MG tablet TAKE 1 TABLET BY MOUTH TWO  TIMES DAILY  . omeprazole (PRILOSEC) 40 MG capsule Take 1 capsule (40 mg total) by mouth daily.  [name] omeprazole (PRILOSEC) 40 MG capsule TAKE 1 CAPSULE BY MOUTH  DAILY  . potassium chloride SA (K-DUR,KLOR-CON) 20 MEQ tablet Take 1 tablet (20 mEq total) by mouth 2 (two) times daily.  . raloxifene (EVISTA) 60 MG tablet TAKE 1 TABLET BY MOUTH  DAILY  . traMADol (ULTRAM) 50 MG tablet TAKE 1 TABLET BY MOUTH TWICE DAILY AS NEEDED FOR MODERATE PAIN OR SEVERE PAIN  . 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   Review of Systems  Constitutional: Positive for fatigue. Negative for activity change and appetite change.  HENT: Negative.   Eyes: Negative for pain.  Respiratory: Negative for shortness of breath.   Cardiovascular: Negative for chest pain, palpitations and leg swelling.  Gastrointestinal: Negative for abdominal pain.  Endocrine: Negative for polydipsia.  Genitourinary: Negative.   Musculoskeletal: Positive for back pain.  Skin: Negative for rash.  Neurological: Negative for dizziness, weakness and headaches.  Hematological: Does not bruise/bleed easily.  Psychiatric/Behavioral: Negative.   All other systems reviewed and are negative.      Objective:   Physical Exam  Constitutional: She is oriented to person, place, and time. She appears well-developed and well-nourished.  HENT:  Head: Normocephalic.  Nose: Nose normal.  Mouth/Throat: Oropharynx is clear and moist.  Eyes: Pupils are equal, round, and reactive to light. EOM are normal.  Neck: Normal range of motion. Neck supple. No JVD present. Carotid bruit is not present.  Cardiovascular: Normal rate, regular rhythm, normal heart sounds and intact distal pulses.  Pulmonary/Chest: Effort normal and breath sounds normal. No respiratory distress. She has no wheezes. She has no rales. She exhibits no tenderness.  Abdominal: Soft. Normal appearance, normal aorta and bowel sounds are normal. She exhibits no distension, no abdominal bruit, no pulsatile midline mass and no mass. There is no splenomegaly or hepatomegaly. There is no tenderness.  Musculoskeletal: Normal range of motion. She exhibits no edema.  FROM of lumbar spine wtith pan on rtation (-) SLR bil Motor strength and sensation distally intact  Lymphadenopathy:    She has no cervical adenopathy.  Neurological: She is alert  and oriented to person, place, and time. She has normal reflexes.  Skin: Skin is warm and dry.  Psychiatric: She has a normal mood and affect. Her behavior is normal. Judgment and thought content normal.  Nursing note and vitals reviewed.  BP (!) 152/69   Pulse (!) 50   Temp 98.2 F (36.8 C) (Oral)   Ht _0  (1.702 m)   Wt 167 lb (75.8 kg)   BMI 26.16 kg/m         Assessment & Plan:  AZRIELLE SPRINGSTEEN comes in today with chief complaint of Medical Management of Chronic Issues   Diagnosis and orders addressed:  1. Persistent atrial fibrillation ([place]) Avoid caffeine Keep follo wup appointment with cardiology - CBC with Differential/Platelet  2. Essential hypertension Low sodium diet - CMP14+EGFR - benazepril (LOTENSIN) 40 MG tablet; TAKE 1 & 1/2 (ONE & ONE-HALF) TABLETS BY MOUTH ONCE DAILY  Dispense: 180 tablet; Refill: 1  3. Chronic diastolic CHF (congestive heart failure) (HCC) Limit fluid intake  4. Gastroesophageal reflux disease without esophagitis Avoid spicy foods Do  not eat 2 hours prior to bedtime  5. Hypothyroidism due to non-medication exogenous substances   6. Diabetes mellitus type 2, diet-controlled ([place]) continue to watch carbs in diet - Bayer DCA Hb A1c Waived - Microalbumin / creatinine urine ratio  7. Osteopenia, senile Weight beairng eexrcises - raloxifene (EVISTA) 60 MG tablet; Take 1 tablet (60 mg total) by mouth daily.  Dispense: 90 tablet; Refill: 1 - traMADol (ULTRAM) 50 MG tablet; TAKE 1 TABLET BY MOUTH TWICE DAILY AS NEEDED FOR MODERATE PAIN OR SEVERE PAIN  Dispense: 60 tablet; Refill: 2  8. CKD (chronic kidney disease) stage 3, GFR 30-59 ml/min (HCC) Labs pending  9. Peripheral edema eevate legs when sitting - furosemide (LASIX) 40 MG tablet; Take one tablet (54m) by mouth in the morning daily.  Take 1/2 tablet (222m by mouth in the afternoon daily.  Dispense: 135 tablet; Refill: 1  10. Hypokalemia - potassium chloride SA  (K-DUR,KLOR-CON) 20 MEQ tablet; Take 1 tablet (20 mEq total) by mouth 2 (two) times daily.  Dispense: 28 tablet; Refill: 0  11. Hyperlipidemia, unspecified hyperlipidemia type Low fat diet - Lipid panel  12. Other constipation increae fiber in diet  13. BMI 26.0-26.9,adult Discussed diet and exercise for person with BMI >25 Will recheck weight in 3-6 months  14. Acute midline low back pain without sciatica Moist heat Rest Tramadol 5041m po bid prn #60 2 refills   Labs pending Health Maintenance reviewed Diet and exercise encouraged  Follow up plan: 3 months   Mary-Margaret MarHassell DoneNP

## 2018-04-06 LAB — CMP14+EGFR
A/G RATIO: 1.6 (ref 1.2–2.2)
ALK PHOS: 64 IU/L (ref 39–117)
ALT: 10 IU/L (ref 0–32)
AST: 16 IU/L (ref 0–40)
Albumin: 3.9 g/dL (ref 3.5–4.7)
BUN/Creatinine Ratio: 14 (ref 12–28)
BUN: 22 mg/dL (ref 8–27)
Bilirubin Total: 0.4 mg/dL (ref 0.0–1.2)
CO2: 24 mmol/L (ref 20–29)
Calcium: 9.1 mg/dL (ref 8.7–10.3)
Chloride: 101 mmol/L (ref 96–106)
Creatinine, Ser: 1.57 mg/dL — ABNORMAL HIGH (ref 0.57–1.00)
GFR calc Af Amer: 35 mL/min/{1.73_m2} — ABNORMAL LOW (ref >59)
GFR calc non Af Amer: 30 mL/min/{1.73_m2} — ABNORMAL LOW (ref >59)
GLOBULIN, TOTAL: 2.5 g/dL (ref 1.5–4.5)
Glucose: 107 mg/dL — ABNORMAL HIGH (ref 65–99)
POTASSIUM: 4.3 mmol/L (ref 3.5–5.2)
SODIUM: 140 mmol/L (ref 134–144)
Total Protein: 6.4 g/dL (ref 6.0–8.5)

## 2018-04-06 LAB — LIPID PANEL
CHOL/HDL RATIO: 2.5 ratio (ref 0.0–4.4)
CHOLESTEROL TOTAL: 123 mg/dL (ref 100–199)
HDL: 49 mg/dL (ref >39)
LDL CALC: 58 mg/dL (ref 0–99)
TRIGLYCERIDES: 82 mg/dL (ref 0–149)
VLDL CHOLESTEROL CAL: 16 mg/dL (ref 5–40)

## 2018-04-06 LAB — CBC WITH DIFFERENTIAL/PLATELET
BASOS: 0 %
Basophils Absolute: 0 10*3/uL (ref 0.0–0.2)
EOS (ABSOLUTE): 0.3 10*3/uL (ref 0.0–0.4)
Eos: 4 %
Hematocrit: 31.9 % — ABNORMAL LOW (ref 34.0–46.6)
Hemoglobin: 10.3 g/dL — ABNORMAL LOW (ref 11.1–15.9)
IMMATURE GRANS (ABS): 0 10*3/uL (ref 0.0–0.1)
Immature Granulocytes: 0 %
LYMPHS ABS: 2.7 10*3/uL (ref 0.7–3.1)
Lymphs: 45 %
MCH: 31.3 pg (ref 26.6–33.0)
MCHC: 32.3 g/dL (ref 31.5–35.7)
MCV: 97 fL (ref 79–97)
MONOS ABS: 0.5 10*3/uL (ref 0.1–0.9)
Monocytes: 8 %
NEUTROS ABS: 2.7 10*3/uL (ref 1.4–7.0)
Neutrophils: 43 %
PLATELETS: 190 10*3/uL (ref 150–450)
RBC: 3.29 x10E6/uL — ABNORMAL LOW (ref 3.77–5.28)
RDW: 12.5 % (ref 12.3–15.4)
WBC: 6.2 10*3/uL (ref 3.4–10.8)

## 2018-04-13 ENCOUNTER — Other Ambulatory Visit: Payer: Self-pay

## 2018-04-13 ENCOUNTER — Telehealth: Payer: Self-pay | Admitting: Nurse Practitioner

## 2018-04-13 DIAGNOSIS — K219 Gastro-esophageal reflux disease without esophagitis: Secondary | ICD-10-CM

## 2018-04-13 MED ORDER — OMEPRAZOLE 40 MG PO CPDR: 40.0000 mg | DELAYED_RELEASE_CAPSULE | Freq: Every day | ORAL | 0 refills | Status: DC

## 2018-04-13 NOTE — Telephone Encounter (Signed)
Omeprazole sent to [facility] for #30. Left detailed message on patients voicemail

## 2018-04-13 NOTE — Telephone Encounter (Signed)
What is the name of the medication? omeprazole (PRILOSEC) 40 MG capsule  Have you contacted your pharmacy to request a refill? no  Which pharmacy would you like this sent to? [facility]. Pt normally gets mail order but cannot wait for refill to come in the mail.   Patient notified that their request is being sent to the clinical staff for review and that they should receive a call once it is complete. If they do not receive a call within 24 hours they can check with their pharmacy or our office.

## 2018-05-05 ENCOUNTER — Ambulatory Visit (INDEPENDENT_AMBULATORY_CARE_PROVIDER_SITE_OTHER): Payer: Medicare Other

## 2018-05-05 VITALS — BP 134/59 | HR 50 | Ht 67.0 in | Wt 168.0 lb

## 2018-05-05 DIAGNOSIS — Z0000 Encounter for general adult medical examination without abnormal findings: Secondary | ICD-10-CM

## 2018-05-05 NOTE — Patient Instructions (Signed)
  Ms. Goforth , Thank you for taking time to come for your Medicare Wellness Visit. I appreciate your ongoing commitment to your health goals. Please review the following plan we discussed and let me know if I can assist you in the future.   These are the goals we discussed: Goals    . DIET - EAT MORE FRUITS AND VEGETABLES    . Exercise 150 minutes per week (moderate activity)       This is a list of the screening recommended for you and due dates:  Health Maintenance  Topic Date Due  . Eye exam for diabetics  01/07/2016  . Flu Shot  03/18/2018  . Colon Cancer Screening  04/06/2019*  . Mammogram  09/01/2018  . Hemoglobin A1C  10/06/2018  . Complete foot exam   04/06/2019  . DEXA scan (bone density measurement)  09/23/2019  . Tetanus Vaccine  11/30/2021  . Pneumonia vaccines  Completed  *Topic was postponed. The date shown is not the original due date.

## 2018-05-05 NOTE — Progress Notes (Signed)
Subjective:   Katie [name] is a 84 y.o. female who presents for Medicare Annual (Subsequent) preventive examination. She currently resides in [facility] where she has been caring for her husband since 2014. He has had an AKA and also has dementia and she is his sole caregiver. She stays busy taking care of him so she doesn't have much time for anything else. She used to walk on a regular basis but due to time constraint and also her balance being off recently she hasn't been able to walk anymore on a regular basis. She has to physically assist her husband with ADLs so she gets some physical exercise working with him daily. 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 She and her husband have two sons. One lives nearby in [place] and the other in [place]. She talks to them on a daily basis. Their home is a one level and has no steps. They currently do not have any advanced directives and patient declines information for them. She states that she was given information before and does not wish to pursue anything like that. After checking the [facility] her immunizations are current. I updated the chart. She is only due for a flu shot and states that she gets this done at [facility] because its easier for her to find a caregiver for her husband while she runs to do that.   Review of Systems:   Cardiac Risk Factors include: advanced age (>35men, >75 women);diabetes mellitus;dyslipidemia;hypertension     Objective:     Vitals: BP (!) 134/59   Pulse (!) 50   Ht 5\' 7"  (1.702 m)   Wt 168 lb (76.2 kg)   BMI 26.31 kg/m   Body mass index is 26.31 kg/m.  Advanced Directives 05/05/2018 04/06/2017 03/26/2016 07/06/2014  Does Patient Have a Medical Advance Directive? No Yes No Yes  Type of Advance Directive - Living will;Healthcare Power of Attorney - -  Does patient want to make changes to  medical advance directive? - Yes (MAU/Ambulatory/Procedural Areas - Information given) - -  Copy of [place]ing in Chart? - No - copy requested - No - copy requested  Would patient like information on creating a medical advance directive? No - Patient declined - No - patient declined information -   Patient states that she doesn't have any type of advanced directive and declines information. She states that this is something she does not want to do  Tobacco Social History   Tobacco Use  Smoking Status Never Smoker  Smokeless Tobacco Never Used     Counseling given: Not Answered   Clinical Intake:  Pre-visit preparation completed: No  Pain : 0-10 Pain Score: 5  Pain Location: Back Pain Orientation: Lower Pain Descriptors / Indicators: Dull Pain Onset: More than a month ago Pain Frequency: Constant Pain Relieving Factors: Laying down and resting helps at times Effect of Pain on Daily Activities: Slows her down with ADLs  Pain Relieving Factors: Laying down and resting helps at times  BMI - recorded: 26.15 Nutritional Status: BMI 25 -29 Overweight Nutritional Risks: None Diabetes: Yes CBG done?: No Did pt. bring in CBG monitor from home?: No  How often do you need to have someone help you when you read instructions, pamphlets, or other written materials from your doctor or pharmacy?: 1 - Never What is the  last grade level you completed in school?: High school graduate  Interpreter Needed?: No  Information entered by :: Theodoro Clock LPN  Past Medical History:  Diagnosis Date  . Aortic insufficiency    a. Trivial AI by echo 02/2016.  [name] Atrial fibrillation and flutter ([place])    a. Coarse afib vs flutter by EKG 12/2015.  . Cataract   . Chronic diastolic CHF (congestive heart failure) ([place])   . CKD (chronic kidney disease), stage III ([place])   . Edema   . GERD (gastroesophageal reflux disease)   . Hiatal hernia   . Hypercholesterolemia   . Hypertension     . Hypokalemia   . Hypothyroidism   . Left knee pain   . NIDDM (non-insulin dependent diabetes mellitus)    diet controlled   . Obesity   . Premature atrial contractions   . PVC's (premature ventricular contractions)   . URI (upper respiratory infection)   . Vitamin D deficiency    Past Surgical History:  Procedure Laterality Date  . ABDOMINAL HYSTERECTOMY    . APPENDECTOMY  1980  . BACK SURGERY    . CATARACT EXTRACTION, BILATERAL    . CHOLECYSTECTOMY  5/00  . COLONOSCOPY    . TONSILLECTOMY    . TOTAL ABDOMINAL HYSTERECTOMY W/ BILATERAL SALPINGOOPHORECTOMY  1980  . UPPER GASTROINTESTINAL ENDOSCOPY     Family History  Problem Relation Age of Onset  . Diabetes Mother   . Stroke Mother   . Heart disease Father   . Stroke Father   . Uterine cancer Sister   . Diabetes Sister   . Ovarian cancer Sister   . Colon cancer Sister   . Diabetes Sister   . Liver cancer Sister        \  . Diabetes Brother   . Dementia Brother   . Atrial fibrillation Sister   . Diabetes Sister   . Diabetes Son   . Heart attack Neg Hx   . Hypertension Neg Hx    Social History   Socioeconomic History  . Marital status: Married    Spouse name: Not on file  . Number of children: 2  . Years of education: Not on file  . Highest education level: High school graduate  Occupational History  . Occupation: Retired  Scientific laboratory technician  . Financial resource strain: Somewhat hard  . Food insecurity:    Worry: Never true    Inability: Never true  . Transportation needs:    Medical: No    Non-medical: No  Tobacco Use  . Smoking status: Never Smoker  . Smokeless tobacco: Never Used  Substance and Sexual Activity  . Alcohol use: No  . Drug use: No  . Sexual activity: Not Currently  Lifestyle  . Physical activity:    Days per week: 0 days    Minutes per session: 0 min  . Stress: Not at all  Relationships  . Social connections:    Talks on phone: More than three times a week    Gets together: More  than three times a week    Attends religious service: Never    Active member of club or organization: No    Attends meetings of clubs or organizations: Never    Relationship status: Married  Other Topics Concern  . Not on file  Social History Narrative  . Not on file    Outpatient Encounter Medications as of 05/05/2018  Medication Sig  . benazepril (LOTENSIN) 40 MG tablet TAKE 1 &  1/2 (ONE & ONE-HALF) TABLETS BY MOUTH ONCE DAILY  . calcium carbonate (OS-CAL) 600 MG TABS Take 600 mg by mouth daily.   . Cholecalciferol (VITAMIN D) 2000 UNITS CAPS Take 2,000 Units by mouth daily.   [name] etodolac (LODINE) 400 MG tablet Take 1 tablet (400 mg total) by mouth 2 (two) times daily. (Patient taking differently: Take 400 mg by mouth daily. )  . fluticasone (FLONASE) 50 MCG/ACT nasal spray Place 2 sprays into both nostrils daily.  . furosemide (LASIX) 40 MG tablet Take one tablet (40mg ) by mouth in the morning daily.  Take 1/2 tablet (20mg ) by mouth in the afternoon daily.  [name] levothyroxine (SYNTHROID, LEVOTHROID) 50 MCG tablet Take 1 tablet (50 mcg total) by mouth daily before breakfast.  . lovastatin (MEVACOR) 40 MG tablet TAKE 2 TABLETS BY MOUTH AT  BEDTIME  . metoprolol tartrate (LOPRESSOR) 25 MG tablet TAKE 1 TABLET BY MOUTH TWO  TIMES DAILY  . omeprazole (PRILOSEC) 40 MG capsule Take 1 capsule (40 mg total) by mouth daily.  . potassium chloride SA (K-DUR,KLOR-CON) 20 MEQ tablet Take 1 tablet (20 mEq total) by mouth 2 (two) times daily.  . raloxifene (EVISTA) 60 MG tablet Take 1 tablet (60 mg total) by mouth daily.  . traMADol (ULTRAM) 50 MG tablet TAKE 1 TABLET BY MOUTH TWICE DAILY AS NEEDED FOR MODERATE PAIN OR SEVERE PAIN  . XARELTO 15 MG TABS tablet TAKE 1 TABLET BY MOUTH ONCE DAILY WITH SUPPER   No facility-administered encounter medications on file as of 05/05/2018.     Activities of Daily Living In your present state of health, do you have any difficulty performing the following activities:  05/05/2018  Hearing? Y  Comment Patient has difficulty hearing. She states that she needs hearing aids but they are expensive  Vision? Y  Comment Wears reading glasses  Difficulty concentrating or making decisions? N  Walking or climbing stairs? N  Dressing or bathing? N  Doing errands, shopping? N  Preparing Food and eating ? N  Using the Toilet? N  In the past six months, have you accidently leaked urine? N  Do you have problems with loss of bowel control? N  Managing your Medications? N  Managing your Finances? N  Housekeeping or managing your Housekeeping? N  Some recent data might be hidden    Patient Care Team: [name], FNP as PCP - General (Nurse Practitioner) [name], MD as Consulting Physician (Cardiology) Kathrene Alu (Physician Assistant) Jessy Oto, MD as Consulting Physician (Orthopedic Surgery) Melina Schools, OD (Optometry)    Assessment:   This is a routine wellness examination for Hasini.  Exercise Activities and Dietary recommendations Current Exercise Habits: The patient does not participate in regular exercise at present, Exercise limited by: None identified  Goals    . DIET - EAT MORE FRUITS AND VEGETABLES    . Exercise 150 minutes per week (moderate activity)       Fall Risk Fall Risk  05/05/2018 04/05/2018 12/21/2017 12/16/2017 09/22/2017  Falls in the past year? No No Yes Yes No  Number falls in past yr: - - 1 1 -  Injury with Fall? - - No No -  Risk for fall due to : - - - - -  Risk for fall due to: Comment - - - - -   Is the patient's home free of loose throw rugs in walkways, pet beds, electrical cords, etc?   yes  Grab bars in the bathroom? yes      Handrails on the stairs?   no      Adequate lighting?   yes    Depression Screen PHQ 2/9 Scores 05/05/2018 04/05/2018 12/21/2017 12/16/2017  PHQ - 2 Score 0 0 0 0  PHQ- 9 Score - - - -     Cognitive Function MMSE - Mini Mental State Exam 05/05/2018  04/06/2017 03/26/2016  Orientation to time 5 4 5   Orientation to Place 5 5 5   Registration 3 3 3   Attention/ Calculation 5 5 5   Recall 3 2 2   Language- name 2 objects 2 2 2   Language- repeat 1 1 0  Language- follow 3 step command 3 3 3   Language- read & follow direction 1 1 1   Write a sentence 1 1 1   Copy design 1 1 0  Total score 30 28 27     Patient completed MMSE without difficulty. Did have trouble hearing some of the questions    Immunization History  Administered Date(s) Administered  . Influenza,inj,Quad PF,6+ Mos 05/09/2013, 05/18/2014, 04/17/2016  . Influenza-Unspecified 04/28/2017  . Pneumococcal Conjugate-13 02/07/2015  . Pneumococcal Polysaccharide-23 06/30/2012  . Tdap 12/01/2011  . Zoster Recombinat (Shingrix) 04/28/2017, 07/16/2017    Qualifies for Shingles Vaccine? Patient has already had both Shingrix vaccines through [facility]  Topic Date Due  . OPHTHALMOLOGY EXAM  01/07/2016  . INFLUENZA VACCINE  03/18/2018  . COLONOSCOPY  04/06/2019 (Originally 05/28/2017)  . MAMMOGRAM  09/01/2018  . HEMOGLOBIN A1C  10/06/2018  . FOOT EXAM  04/06/2019  . DEXA SCAN  09/23/2019  . TETANUS/TDAP  11/30/2021  . PNA vac Low Risk Adult  Completed    Cancer Screenings: Lung: Low Dose CT Chest recommended if Age 54-80 years, 30 pack-year currently smoking OR have quit w/in 15years. Patient does not qualify. Breast:  Up to date on Mammogram? Yes   Up to date of Bone Density/Dexa? Yes Colorectal: Patient is due for a colonoscopy  Additional Screenings:  Hepatitis C Screening: Patient does not fall within the guidelines for this test     Plan:     I have personally reviewed and noted the following in the patient's chart:   . Medical and social history . Use of alcohol, tobacco or illicit drugs  . Current medications and supplements . Functional ability and status . Nutritional status . Physical activity . Advanced directives . List  of other physicians . Hospitalizations, surgeries, and ER visits in previous 12 months . Vitals . Screenings to include cognitive, depression, and falls . Referrals and appointments  In addition, I have reviewed and discussed with patient certain preventive protocols, quality metrics, and best practice recommendations. A written personalized care plan for preventive services as well as general preventive health recommendations were provided to patient.     Rolena Infante, LPN  11/14/760

## 2018-05-14 ENCOUNTER — Other Ambulatory Visit: Payer: Self-pay | Admitting: Nurse Practitioner

## 2018-05-14 DIAGNOSIS — E876 Hypokalemia: Secondary | ICD-10-CM

## 2018-06-04 ENCOUNTER — Other Ambulatory Visit: Payer: Self-pay | Admitting: Nurse Practitioner

## 2018-06-04 DIAGNOSIS — I10 Essential (primary) hypertension: Secondary | ICD-10-CM

## 2018-06-21 ENCOUNTER — Other Ambulatory Visit: Payer: Self-pay | Admitting: Nurse Practitioner

## 2018-06-21 DIAGNOSIS — R609 Edema, unspecified: Secondary | ICD-10-CM

## 2018-06-21 DIAGNOSIS — E876 Hypokalemia: Secondary | ICD-10-CM

## 2018-06-21 DIAGNOSIS — E032 Hypothyroidism due to medicaments and other exogenous substances: Secondary | ICD-10-CM

## 2018-06-21 DIAGNOSIS — I10 Essential (primary) hypertension: Secondary | ICD-10-CM

## 2018-06-21 DIAGNOSIS — E782 Mixed hyperlipidemia: Secondary | ICD-10-CM

## 2018-06-22 ENCOUNTER — Other Ambulatory Visit: Payer: Self-pay | Admitting: *Deleted

## 2018-06-22 MED ORDER — RIVAROXABAN 15 MG PO TABS: ORAL_TABLET | ORAL | 0 refills | Status: DC

## 2018-07-06 DIAGNOSIS — D485 Neoplasm of uncertain behavior of skin: Secondary | ICD-10-CM | POA: Diagnosis not present

## 2018-07-06 DIAGNOSIS — C4442 Squamous cell carcinoma of skin of scalp and neck: Secondary | ICD-10-CM | POA: Diagnosis not present

## 2018-07-06 DIAGNOSIS — L570 Actinic keratosis: Secondary | ICD-10-CM | POA: Diagnosis not present

## 2018-07-08 ENCOUNTER — Ambulatory Visit: Payer: Medicare Other | Admitting: Nurse Practitioner

## 2018-07-09 ENCOUNTER — Ambulatory Visit: Payer: Medicare Other | Admitting: Nurse Practitioner

## 2018-07-12 DIAGNOSIS — H40033 Anatomical narrow angle, bilateral: Secondary | ICD-10-CM | POA: Diagnosis not present

## 2018-07-12 DIAGNOSIS — H10013 Acute follicular conjunctivitis, bilateral: Secondary | ICD-10-CM | POA: Diagnosis not present

## 2018-07-20 ENCOUNTER — Other Ambulatory Visit: Payer: Self-pay | Admitting: Nurse Practitioner

## 2018-07-20 DIAGNOSIS — I10 Essential (primary) hypertension: Secondary | ICD-10-CM

## 2018-07-21 ENCOUNTER — Encounter: Payer: Self-pay | Admitting: Nurse Practitioner

## 2018-07-21 ENCOUNTER — Ambulatory Visit (INDEPENDENT_AMBULATORY_CARE_PROVIDER_SITE_OTHER): Payer: Medicare Other | Admitting: Nurse Practitioner

## 2018-07-21 VITALS — BP 137/54 | HR 49 | Temp 97.6°F | Ht 67.0 in | Wt 164.0 lb

## 2018-07-21 DIAGNOSIS — I4819 Other persistent atrial fibrillation: Secondary | ICD-10-CM

## 2018-07-21 DIAGNOSIS — I10 Essential (primary) hypertension: Secondary | ICD-10-CM

## 2018-07-21 DIAGNOSIS — N183 Chronic kidney disease, stage 3 unspecified: Secondary | ICD-10-CM

## 2018-07-21 DIAGNOSIS — D649 Anemia, unspecified: Secondary | ICD-10-CM

## 2018-07-21 DIAGNOSIS — Z6826 Body mass index (BMI) 26.0-26.9, adult: Secondary | ICD-10-CM

## 2018-07-21 DIAGNOSIS — I5032 Chronic diastolic (congestive) heart failure: Secondary | ICD-10-CM

## 2018-07-21 DIAGNOSIS — E119 Type 2 diabetes mellitus without complications: Secondary | ICD-10-CM | POA: Diagnosis not present

## 2018-07-21 DIAGNOSIS — E782 Mixed hyperlipidemia: Secondary | ICD-10-CM | POA: Diagnosis not present

## 2018-07-21 DIAGNOSIS — R609 Edema, unspecified: Secondary | ICD-10-CM

## 2018-07-21 DIAGNOSIS — M8580 Other specified disorders of bone density and structure, unspecified site: Secondary | ICD-10-CM

## 2018-07-21 DIAGNOSIS — E032 Hypothyroidism due to medicaments and other exogenous substances: Secondary | ICD-10-CM

## 2018-07-21 DIAGNOSIS — E876 Hypokalemia: Secondary | ICD-10-CM

## 2018-07-21 DIAGNOSIS — K219 Gastro-esophageal reflux disease without esophagitis: Secondary | ICD-10-CM

## 2018-07-21 LAB — BAYER DCA HB A1C WAIVED: HB A1C (BAYER DCA - WAIVED): 5.7 % (ref <7.0)

## 2018-07-21 MED ORDER — FUROSEMIDE 40 MG PO TABS: ORAL_TABLET | ORAL | 1 refills | Status: DC

## 2018-07-21 MED ORDER — RIVAROXABAN 15 MG PO TABS: ORAL_TABLET | ORAL | 0 refills | Status: DC

## 2018-07-21 MED ORDER — BENAZEPRIL HCL 40 MG PO TABS: ORAL_TABLET | ORAL | 1 refills | Status: DC

## 2018-07-21 MED ORDER — OMEPRAZOLE 40 MG PO CPDR: 40.0000 mg | DELAYED_RELEASE_CAPSULE | Freq: Every day | ORAL | 0 refills | Status: DC

## 2018-07-21 MED ORDER — TRAMADOL HCL 50 MG PO TABS: ORAL_TABLET | ORAL | 2 refills | Status: DC

## 2018-07-21 MED ORDER — RALOXIFENE HCL 60 MG PO TABS: 60.0000 mg | ORAL_TABLET | Freq: Every day | ORAL | 1 refills | Status: DC

## 2018-07-21 MED ORDER — METOPROLOL TARTRATE 25 MG PO TABS: 25.0000 mg | ORAL_TABLET | Freq: Two times a day (BID) | ORAL | 1 refills | Status: DC

## 2018-07-21 MED ORDER — LOVASTATIN 40 MG PO TABS: 80.0000 mg | ORAL_TABLET | Freq: Every day | ORAL | 1 refills | Status: DC

## 2018-07-21 MED ORDER — POTASSIUM CHLORIDE CRYS ER 20 MEQ PO TBCR: 20.0000 meq | EXTENDED_RELEASE_TABLET | Freq: Two times a day (BID) | ORAL | 1 refills | Status: DC

## 2018-07-21 MED ORDER — LEVOTHYROXINE SODIUM 50 MCG PO TABS: ORAL_TABLET | ORAL | 1 refills | Status: DC

## 2018-07-21 NOTE — Patient Instructions (Signed)

## 2018-07-21 NOTE — Progress Notes (Signed)
Subjective:    Patient ID: Katie [name], female    DOB: May 03, 1932, 84 y.o.   MRN: [id number]   Chief Complaint: medical management of chronic issues  HPI:  1. Essential hypertension  No c/o chest pain, sob or headache. Does not check blood pressure at home. BP Readings from Last 3 Encounters:  05/05/18 (!) 134/59  04/05/18 (!) 152/69  12/21/17 (!) 148/61     2. Mixed hyperlipidemia  Has been watching diet.   3. Diabetes mellitus type 2, diet-controlled (Katie [name])  Last hgba1c was 6.4%. Does not check blood sugar at home. She denies any symptoms of hypoglycemia  4. Low hemoglobin  Will check labs today  5. Chronic diastolic CHF (congestive heart failure) (H[facility])  Has occasional edema but no SOB. Has not seen cardiology in over a year.  6. Persistent atrial fibrillation  She is on lopressor for rate control and is also on xarelto. She denies any bleeding proiblems  7. Gastroesophageal reflux disease without esophagitis  Takes omeprazole daily to keep from having heart burn symptoms.  8. Hypothyroidism due to non-medication exogenous substances No problems that she is aware of   9. Osteopenia, senile  Last dexascan was done 09/22/17 with tscore of -1.8. She does not get to do much weight beairng exercise. Has daily back pain and takes ultram bid as needed. Most of pain is coming from having to lift her husband all the time.  10. CKD (chronic kidney disease) stage 3, GFR 30-59 ml/min ([facility]) We are currently just watching labs   11. BMI 26.0-26.9,adult  Weight is down 4lbs from previous visit  12. Hypokalemia  Is on vitamin d supplement daily  13. Peripheral edema  Has some edema in lower ext at the end of day.    Outpatient Encounter Medications as of 07/21/2018  Medication Sig  . benazepril (LOTENSIN) 40 MG tablet TAKE 1 & 1/2 (ONE & ONE-HALF) TABLETS BY MOUTH ONCE DAILY  . calcium carbonate (OS-CAL) 600 MG TABS Take 600 mg by mouth daily.   . Cholecalciferol (VITAMIN D) 2000  UNITS CAPS Take 2,000 Units by mouth daily.   [name] etodolac (LODINE) 400 MG tablet TAKE 1 TABLET BY MOUTH TWO  TIMES DAILY  . fluticasone (FLONASE) 50 MCG/ACT nasal spray Place 2 sprays into both nostrils daily.  . furosemide (LASIX) 40 MG tablet TAKE ONE TABLET BY MOUTH IN THE MORNING DAILY. TAKE 1/2 TABLET BY MOUTH IN THE  AFTERNOON DAILY.  [name] levothyroxine (SYNTHROID, LEVOTHROID) 50 MCG tablet TAKE 1 TABLET BY MOUTH  DAILY BEFORE BREAKFAST  . lovastatin (MEVACOR) 40 MG tablet TAKE 2 TABLETS BY MOUTH AT  BEDTIME  . metoprolol tartrate (LOPRESSOR) 25 MG tablet TAKE 1 TABLET BY MOUTH TWO  TIMES DAILY  . omeprazole (PRILOSEC) 40 MG capsule Take 1 capsule (40 mg total) by mouth daily.  . potassium chloride SA (K-DUR,KLOR-CON) 20 MEQ tablet TAKE 1 TABLET BY MOUTH TWO  TIMES DAILY  . raloxifene (EVISTA) 60 MG tablet Take 1 tablet (60 mg total) by mouth daily.  . Rivaroxaban (XARELTO) 15 MG TABS tablet TAKE 1 TABLET BY MOUTH ONCE DAILY WITH SUPPER  . traMADol (ULTRAM) 50 MG tablet TAKE 1 TABLET BY MOUTH TWICE DAILY AS NEEDED FOR MODERATE PAIN OR SEVERE PAIN       New complaints: None today  Social history: Takes care of her husband that can do nothing for hisself.   Review of Systems  Constitutional: Negative for activity change and appetite change.  HENT: Negative.   Eyes: Negative for pain.  Respiratory: Negative for shortness of breath.   Cardiovascular: Negative for chest pain, palpitations and leg swelling.  Gastrointestinal: Negative for abdominal pain.  Endocrine: Negative for polydipsia.  Genitourinary: Negative.   Skin: Negative for rash.  Neurological: Negative for dizziness, weakness and headaches.  Hematological: Does not bruise/bleed easily.  Psychiatric/Behavioral: Negative.   All other systems reviewed and are negative.      Objective:   Physical Exam  Constitutional: She is oriented to person, place, and time. She appears well-developed and well-nourished. No  distress.  HENT:  Head: Normocephalic.  Nose: Nose normal.  Mouth/Throat: Oropharynx is clear and moist.  Eyes: Pupils are equal, round, and reactive to light. EOM are normal.  Neck: Normal range of motion. Neck supple. No JVD present. Carotid bruit is not present.  Cardiovascular: Normal rate, regular rhythm, normal heart sounds and intact distal pulses.  Pulmonary/Chest: Effort normal and breath sounds normal. No respiratory distress. She has no wheezes. She has no rales. She exhibits no tenderness.  Abdominal: Soft. Normal appearance, normal aorta and bowel sounds are normal. She exhibits no distension, no abdominal bruit, no pulsatile midline mass and no mass. There is no splenomegaly or hepatomegaly. There is no tenderness.  Musculoskeletal: Normal range of motion. She exhibits no edema.  Lymphadenopathy:    She has no cervical adenopathy.  Neurological: She is alert and oriented to person, place, and time. She has normal reflexes.  Skin: Skin is warm and dry.  Psychiatric: She has a normal mood and affect. Her behavior is normal. Judgment and thought content normal.  Nursing note and vitals reviewed.  BP (!) 137/54   Pulse (!) 49   Temp 97.6 F (36.4 C) (Oral)   Ht '5\' 7"'  (1.702 m)   Wt 164 lb (74.4 kg)   BMI 25.69 kg/m       Assessment & Plan:  Katie [name] comes in today with chief complaint of Medical Management of Chronic Issues   Diagnosis and orders addressed:  1. Essential hypertension Low sodium diet - CMP14+EGFR - benazepril (LOTENSIN) 40 MG tablet; TAKE 1 & 1/2 (ONE & ONE-HALF) TABLETS BY MOUTH ONCE DAILY  Dispense: 180 tablet; Refill: 1 - metoprolol tartrate (LOPRESSOR) 25 MG tablet; Take 1 tablet (25 mg total) by mouth 2 (two) times daily.  Dispense: 180 tablet; Refill: 1  2. Mixed hyperlipidemia Low fat diet - Lipid panel - lovastatin (MEVACOR) 40 MG tablet; Take 2 tablets (80 mg total) by mouth at bedtime.  Dispense: 180 tablet; Refill: 1  3. Diabetes  mellitus type 2, diet-controlled (Katie [name]) Continue to watch carbs in diet - Bayer DCA Hb A1c Waived - Microalbumin / creatinine urine ratio  4. Low hemoglobin Labs pending - CBC with Differential/Platelet  5. Chronic diastolic CHF (congestive heart failure) ([facility]) Continue lasix  6. Persistent atrial fibrillation Avoid caffeine - Rivaroxaban (XARELTO) 15 MG TABS tablet; TAKE 1 TABLET BY MOUTH ONCE DAILY WITH SUPPER  Dispense: 90 tablet; Refill: 0  7. Gastroesophageal reflux disease without esophagitis Avoid spicy foods Do not eat 2 hours prior to bedtime - omeprazole (PRILOSEC) 40 MG capsule; Take 1 capsule (40 mg total) by mouth daily.  Dispense: 30 capsule; Refill: 0  8. Hypothyroidism due to non-medication exogenous substances - metoprolol tartrate (LOPRESSOR) 25 MG tablet; Take 1 tablet (25 mg total) by mouth 2 (two) times daily.  Dispense: 180 tablet; Refill: 1 - levothyroxine (SYNTHROID, LEVOTHROID) 50 MCG tablet; TAKE 1 TABLET  BY MOUTH  DAILY BEFORE BREAKFAST  Dispense: 90 tablet; Refill: 1  9. Osteopenia, senile Weight bearing exercises - traMADol (ULTRAM) 50 MG tablet; TAKE 1 TABLET BY MOUTH TWICE DAILY AS NEEDED FOR MODERATE PAIN OR SEVERE PAIN  Dispense: 60 tablet; Refill: 2 - raloxifene (EVISTA) 60 MG tablet; Take 1 tablet (60 mg total) by mouth daily.  Dispense: 90 tablet; Refill: 1  10. CKD (chronic kidney disease) stage 3, GFR 30-59 ml/min ([facility]) Labs oending  11. BMI 26.0-26.9,adult Discussed diet and exercise for person with BMI >25 Will recheck weight in 3-6 months  12. Hypokalemia - potassium chloride SA (K-DUR,KLOR-CON) 20 MEQ tablet; Take 1 tablet (20 mEq total) by mouth 2 (two) times daily.  Dispense: 180 tablet; Refill: 1  13. Peripheral edema Elevate legs when sitting - furosemide (LASIX) 40 MG tablet; TAKE ONE TABLET BY MOUTH IN THE MORNING DAILY. TAKE 1/2 TABLET BY MOUTH IN THE  AFTERNOON DAILY.  Dispense: 135 tablet; Refill: 1   Labs pending Health  Maintenance reviewed Diet and exercise encouraged  Follow up plan: 3 months   Mary-Margaret Hassell Done, FNP

## 2018-07-22 LAB — LIPID PANEL
Chol/HDL Ratio: 2.2 ratio (ref 0.0–4.4)
Cholesterol, Total: 129 mg/dL (ref 100–199)
HDL: 58 mg/dL (ref >39)
LDL Calculated: 54 mg/dL (ref 0–99)
Triglycerides: 87 mg/dL (ref 0–149)
VLDL Cholesterol Cal: 17 mg/dL (ref 5–40)

## 2018-07-22 LAB — CMP14+EGFR
ALT: 10 IU/L (ref 0–32)
AST: 14 IU/L (ref 0–40)
Albumin/Globulin Ratio: 2.4 — ABNORMAL HIGH (ref 1.2–2.2)
Albumin: 4.4 g/dL (ref 3.5–4.7)
Alkaline Phosphatase: 63 IU/L (ref 39–117)
BUN/Creatinine Ratio: 16 (ref 12–28)
BUN: 24 mg/dL (ref 8–27)
Bilirubin Total: 0.5 mg/dL (ref 0.0–1.2)
CHLORIDE: 104 mmol/L (ref 96–106)
CO2: 24 mmol/L (ref 20–29)
Calcium: 9.3 mg/dL (ref 8.7–10.3)
Creatinine, Ser: 1.52 mg/dL — ABNORMAL HIGH (ref 0.57–1.00)
GFR calc Af Amer: 36 mL/min/{1.73_m2} — ABNORMAL LOW (ref >59)
GFR, EST NON AFRICAN AMERICAN: 31 mL/min/{1.73_m2} — AB (ref >59)
Globulin, Total: 1.8 g/dL (ref 1.5–4.5)
Glucose: 99 mg/dL (ref 65–99)
POTASSIUM: 4.5 mmol/L (ref 3.5–5.2)
Sodium: 144 mmol/L (ref 134–144)
TOTAL PROTEIN: 6.2 g/dL (ref 6.0–8.5)

## 2018-07-22 LAB — CBC WITH DIFFERENTIAL/PLATELET
BASOS: 0 %
Basophils Absolute: 0 10*3/uL (ref 0.0–0.2)
EOS (ABSOLUTE): 0.2 10*3/uL (ref 0.0–0.4)
EOS: 3 %
HEMATOCRIT: 32.2 % — AB (ref 34.0–46.6)
Hemoglobin: 10.8 g/dL — ABNORMAL LOW (ref 11.1–15.9)
Immature Grans (Abs): 0 10*3/uL (ref 0.0–0.1)
Immature Granulocytes: 0 %
LYMPHS ABS: 2.6 10*3/uL (ref 0.7–3.1)
Lymphs: 47 %
MCH: 32 pg (ref 26.6–33.0)
MCHC: 33.5 g/dL (ref 31.5–35.7)
MCV: 96 fL (ref 79–97)
MONOCYTES: 8 %
Monocytes Absolute: 0.4 10*3/uL (ref 0.1–0.9)
NEUTROS ABS: 2.3 10*3/uL (ref 1.4–7.0)
Neutrophils: 42 %
Platelets: 188 10*3/uL (ref 150–450)
RBC: 3.37 x10E6/uL — ABNORMAL LOW (ref 3.77–5.28)
RDW: 12.5 % (ref 12.3–15.4)
WBC: 5.6 10*3/uL (ref 3.4–10.8)

## 2018-07-22 LAB — MICROALBUMIN / CREATININE URINE RATIO
Creatinine, Urine: 53.2 mg/dL
MICROALB/CREAT RATIO: 8.3 mg/g{creat} (ref 0.0–30.0)
Microalbumin, Urine: 4.4 ug/mL

## 2018-09-08 DIAGNOSIS — D044 Carcinoma in situ of skin of scalp and neck: Secondary | ICD-10-CM | POA: Diagnosis not present

## 2018-09-20 ENCOUNTER — Other Ambulatory Visit: Payer: Self-pay | Admitting: Nurse Practitioner

## 2018-09-20 DIAGNOSIS — K219 Gastro-esophageal reflux disease without esophagitis: Secondary | ICD-10-CM

## 2018-09-20 MED ORDER — OMEPRAZOLE 40 MG PO CPDR: 40.0000 mg | DELAYED_RELEASE_CAPSULE | Freq: Every day | ORAL | 1 refills | Status: DC

## 2018-09-20 NOTE — Telephone Encounter (Signed)
Pt aware refill sent to pharmacy 

## 2018-09-20 NOTE — Telephone Encounter (Signed)
What is the name of the medication? omeprozole 40mg    Have you contacted your pharmacy to request a refill? no  Which pharmacy would you like this sent to? [facility]   Patient notified that their request is being sent to the clinical staff for review and that they should receive a call once it is complete. If they do not receive a call within 24 hours they can check with their pharmacy or our office.

## 2018-09-21 ENCOUNTER — Other Ambulatory Visit: Payer: Self-pay | Admitting: Nurse Practitioner

## 2018-09-21 DIAGNOSIS — I4819 Other persistent atrial fibrillation: Secondary | ICD-10-CM

## 2018-09-23 ENCOUNTER — Ambulatory Visit: Payer: Medicare Other | Admitting: Family Medicine

## 2018-09-24 ENCOUNTER — Ambulatory Visit (INDEPENDENT_AMBULATORY_CARE_PROVIDER_SITE_OTHER): Payer: Medicare Other | Admitting: Family

## 2018-09-24 ENCOUNTER — Encounter: Payer: Self-pay | Admitting: Family

## 2018-09-24 VITALS — BP 135/60 | HR 48 | Temp 97.5°F | Ht 67.0 in | Wt 165.4 lb

## 2018-09-24 DIAGNOSIS — J0100 Acute maxillary sinusitis, unspecified: Secondary | ICD-10-CM

## 2018-09-24 MED ORDER — BENZONATATE 200 MG PO CAPS: 200.0000 mg | ORAL_CAPSULE | Freq: Three times a day (TID) | ORAL | 1 refills | Status: DC | PRN

## 2018-09-24 MED ORDER — METHYLPREDNISOLONE ACETATE 80 MG/ML IJ SUSP
80.0000 mg | Freq: Once | INTRAMUSCULAR | Status: AC
  Administered 2018-09-24: 80 mg via INTRAMUSCULAR

## 2018-09-24 MED ORDER — AZITHROMYCIN 250 MG PO TABS: ORAL_TABLET | ORAL | 0 refills | Status: DC

## 2018-09-24 NOTE — Progress Notes (Signed)
Subjective:    Patient ID: Katie [name], female    DOB: 07/17/1932, 85 y.o.   MRN: [id number]  Chief Complaint  Patient presents with  . chest congestion with cough    Cough  Associated symptoms include chills, headaches and a sore throat. Pertinent negatives include no ear pain.  Sinus Problem  This is a new problem. The current episode started in the past 7 days. The problem has been gradually worsening since onset. There has been no fever. Her pain is at a severity of 7/10. The pain is moderate. Associated symptoms include chills, congestion, coughing, headaches, sinus pressure, sneezing and a sore throat. Pertinent negatives include no ear pain. Past treatments include acetaminophen and oral decongestants. The treatment provided mild relief.      Review of Systems  Constitutional: Positive for chills.  HENT: Positive for congestion, sinus pressure, sneezing and sore throat. Negative for ear pain.   Respiratory: Positive for cough.   Neurological: Positive for headaches.  All other systems reviewed and are negative.      Objective:   Physical Exam Vitals signs reviewed.  Constitutional:      General: She is not in acute distress.    Appearance: She is well-developed.  HENT:     Head: Normocephalic and atraumatic.     Right Ear: Tympanic membrane normal.     Left Ear: Tympanic membrane normal.     Nose: Mucosal edema present.     Right Sinus: Maxillary sinus tenderness present.     Left Sinus: Maxillary sinus tenderness present.     Mouth/Throat:     Pharynx: Posterior oropharyngeal erythema present.  Eyes:     Pupils: Pupils are equal, round, and reactive to light.  Neck:     Musculoskeletal: Normal range of motion and neck supple.     Thyroid: No thyromegaly.  Cardiovascular:     Rate and Rhythm: Normal rate and regular rhythm.     Heart sounds: Normal heart sounds. No murmur.  Pulmonary:     Effort: Pulmonary effort is normal. No respiratory distress.   Breath sounds: Normal breath sounds. No wheezing.  Abdominal:     General: Bowel sounds are normal. There is no distension.     Palpations: Abdomen is soft.     Tenderness: There is no abdominal tenderness.  Musculoskeletal: Normal range of motion.        General: No tenderness.  Skin:    General: Skin is warm and dry.  Neurological:     Mental Status: She is alert and oriented to person, place, and time.     Cranial Nerves: No cranial nerve deficit.     Deep Tendon Reflexes: Reflexes are normal and symmetric.  Psychiatric:        Behavior: Behavior normal.        Thought Content: Thought content normal.        Judgment: Judgment normal.       BP 135/60   Pulse (!) 48   Temp (!) 97.5 F (36.4 C) (Oral)   Ht 5\' 7"  (1.702 m)   Wt 165 lb 6.4 oz (75 kg)   BMI 25.91 kg/m      Assessment & Plan:  Katie [name] comes in today with chief complaint of chest congestion with cough   Diagnosis and orders addressed:  1. Acute maxillary sinusitis, recurrence not specified - Take meds as prescribed - Use a cool mist humidifier  -Use saline nose sprays frequently -Force fluids -For  any cough or congestion  Use plain Mucinex- regular strength or max strength is fine -For fever or aces or pains- take tylenol or ibuprofen. -Throat lozenges if help Discussed waiting on starting antibiotics for a few days to see if symptoms resolve -RTO if symptoms worsen or do not improve  - azithromycin (ZITHROMAX) 250 MG tablet; Take 500 mg once, then 250 mg for four days  Dispense: 6 tablet; Refill: 0 - benzonatate (TESSALON) 200 MG capsule; Take 1 capsule (200 mg total) by mouth 3 (three) times daily as needed.  Dispense: 30 capsule; Refill: 1 - methylPREDNISolone acetate (DEPO-MEDROL) injection 80 mg  Katie Dun, FNP

## 2018-09-24 NOTE — Patient Instructions (Signed)
Sinusitis, Adult  Sinusitis is inflammation of your sinuses. Sinuses are hollow spaces in the bones around your face. Your sinuses are located:   Around your eyes.   In the middle of your forehead.   Behind your nose.   In your cheekbones.  Mucus normally drains out of your sinuses. When your nasal tissues become inflamed or swollen, mucus can become trapped or blocked. This allows bacteria, viruses, and fungi to grow, which leads to infection. Most infections of the sinuses are caused by a virus.  Sinusitis can develop quickly. It can last for up to 4 weeks (acute) or for more than 12 weeks (chronic). Sinusitis often develops after a cold.  What are the causes?  This condition is caused by anything that creates swelling in the sinuses or stops mucus from draining. This includes:   Allergies.   Asthma.   Infection from bacteria or viruses.   Deformities or blockages in your nose or sinuses.   Abnormal growths in the nose (nasal polyps).   Pollutants, such as chemicals or irritants in the air.   Infection from fungi (rare).  What increases the risk?  You are more likely to develop this condition if you:   Have a weak body defense system (immune system).   Do a lot of swimming or diving.   Overuse nasal sprays.   Smoke.  What are the signs or symptoms?  The main symptoms of this condition are pain and a feeling of pressure around the affected sinuses. Other symptoms include:   Stuffy nose or congestion.   Thick drainage from your nose.   Swelling and warmth over the affected sinuses.   Headache.   Upper toothache.   A cough that may get worse at night.   Extra mucus that collects in the throat or the back of the nose (postnasal drip).   Decreased sense of smell and taste.   Fatigue.   A fever.   Sore throat.   Bad breath.  How is this diagnosed?  This condition is diagnosed based on:   Your symptoms.   Your medical history.   A physical exam.   Tests to find out if your condition is  acute or chronic. This may include:  ? Checking your nose for nasal polyps.  ? Viewing your sinuses using a device that has a light (endoscope).  ? Testing for allergies or bacteria.  ? Imaging tests, such as an MRI or CT scan.  In rare cases, a bone biopsy may be done to rule out more serious types of fungal sinus disease.  How is this treated?  Treatment for sinusitis depends on the cause and whether your condition is chronic or acute.   If caused by a virus, your symptoms should go away on their own within 10 days. You may be given medicines to relieve symptoms. They include:  ? Medicines that shrink swollen nasal passages (topical intranasal decongestants).  ? Medicines that treat allergies (antihistamines).  ? A spray that eases inflammation of the nostrils (topical intranasal corticosteroids).  ? Rinses that help get rid of thick mucus in your nose (nasal saline washes).   If caused by bacteria, your health care provider may recommend waiting to see if your symptoms improve. Most bacterial infections will get better without antibiotic medicine. You may be given antibiotics if you have:  ? A severe infection.  ? A weak immune system.   If caused by narrow nasal passages or nasal polyps, you may need   to have surgery.  Follow these instructions at home:  Medicines   Take, use, or apply over-the-counter and prescription medicines only as told by your health care provider. These may include nasal sprays.   If you were prescribed an antibiotic medicine, take it as told by your health care provider. Do not stop taking the antibiotic even if you start to feel better.  Hydrate and humidify     Drink enough fluid to keep your urine pale yellow. Staying hydrated will help to thin your mucus.   Use a cool mist humidifier to keep the humidity level in your home above 50%.   Inhale steam for 10-15 minutes, 3-4 times a day, or as told by your health care provider. You can do this in the bathroom while a hot shower is  running.   Limit your exposure to cool or dry air.  Rest   Rest as much as possible.   Sleep with your head raised (elevated).   Make sure you get enough sleep each night.  General instructions     Apply a warm, moist washcloth to your face 3-4 times a day or as told by your health care provider. This will help with discomfort.   Wash your hands often with soap and water to reduce your exposure to germs. If soap and water are not available, use hand sanitizer.   Do not smoke. Avoid being around people who are smoking (secondhand smoke).   Keep all follow-up visits as told by your health care provider. This is important.  Contact a health care provider if:   You have a fever.   Your symptoms get worse.   Your symptoms do not improve within 10 days.  Get help right away if:   You have a severe headache.   You have persistent vomiting.   You have severe pain or swelling around your face or eyes.   You have vision problems.   You develop confusion.   Your neck is stiff.   You have trouble breathing.  Summary   Sinusitis is soreness and inflammation of your sinuses. Sinuses are hollow spaces in the bones around your face.   This condition is caused by nasal tissues that become inflamed or swollen. The swelling traps or blocks the flow of mucus. This allows bacteria, viruses, and fungi to grow, which leads to infection.   If you were prescribed an antibiotic medicine, take it as told by your health care provider. Do not stop taking the antibiotic even if you start to feel better.   Keep all follow-up visits as told by your health care provider. This is important.  This information is not intended to replace advice given to you by your health care provider. Make sure you discuss any questions you have with your health care provider.  Document Released: 08/04/2005 Document Revised: 01/04/2018 Document Reviewed: 01/04/2018  [employer]vier Interactive Patient Education  2019 [employer]

## 2018-09-28 ENCOUNTER — Ambulatory Visit (INDEPENDENT_AMBULATORY_CARE_PROVIDER_SITE_OTHER): Payer: Medicare Other | Admitting: Nurse Practitioner

## 2018-09-28 ENCOUNTER — Encounter: Payer: Self-pay | Admitting: Nurse Practitioner

## 2018-09-28 VITALS — BP 171/67 | HR 48 | Temp 98.0°F | Ht 67.0 in | Wt 162.0 lb

## 2018-09-28 DIAGNOSIS — J40 Bronchitis, not specified as acute or chronic: Secondary | ICD-10-CM | POA: Diagnosis not present

## 2018-09-28 MED ORDER — PREDNISONE 20 MG PO TABS: ORAL_TABLET | ORAL | 0 refills | Status: DC

## 2018-09-28 MED ORDER — HYDROCODONE-HOMATROPINE 5-1.5 MG/5ML PO SYRP: 5.0000 mL | ORAL_SOLUTION | Freq: Four times a day (QID) | ORAL | 0 refills | Status: DC | PRN

## 2018-09-28 NOTE — Progress Notes (Signed)
Subjective:    Patient ID: Katie [name], female    DOB: Dec 27, 1931, 85 y.o.   MRN: [id number]   Chief Complaint: chest congestion and SOB at night time   HPI Patient comes in today c/o cough and congestion. She came down here Friday and was dx with sinus infection. She has 1 more day of antibiotic. She says she is still coughing with burning in chest.  Review of Systems  Constitutional: Positive for appetite change (decreased). Negative for chills and fever.  HENT: Positive for congestion and rhinorrhea. Negative for sore throat and trouble swallowing.   Respiratory: Positive for cough.   Cardiovascular: Negative.   Gastrointestinal: Negative.   Genitourinary: Negative.   Neurological: Positive for headaches.  Psychiatric/Behavioral: Negative.   All other systems reviewed and are negative.      Objective:   Physical Exam Constitutional:      General: She is not in acute distress.    Appearance: She is normal weight.  HENT:     Right Ear: Hearing, tympanic membrane, ear canal and external ear normal.     Left Ear: Hearing, tympanic membrane, ear canal and external ear normal.     Nose: Mucosal edema, congestion and rhinorrhea present.     Right Sinus: No maxillary sinus tenderness or frontal sinus tenderness.     Left Sinus: No maxillary sinus tenderness or frontal sinus tenderness.     Mouth/Throat:     Lips: Pink.     Mouth: Mucous membranes are moist.     Pharynx: Oropharynx is clear.  Eyes:     Extraocular Movements: Extraocular movements intact.     Pupils: Pupils are equal, round, and reactive to light.  Cardiovascular:     Rate and Rhythm: Normal rate and regular rhythm.     Heart sounds: Normal heart sounds.  Pulmonary:     Effort: Pulmonary effort is normal.     Breath sounds: Normal breath sounds.     Comments: Deep dry cough  Abdominal:     General: Abdomen is flat.     Palpations: Abdomen is soft.  Skin:    General: Skin is warm and dry.    Neurological:     General: No focal deficit present.     Mental Status: She is alert and oriented to person, place, and time.  Psychiatric:        Mood and Affect: Mood normal.        Behavior: Behavior normal.    BP (!) 171/67   Pulse (!) 48   Temp 98 F (36.7 C) (Oral)   Ht 5\' 7"  (1.702 m)   Wt 162 lb (73.5 kg)   BMI 25.37 kg/m         Assessment & Plan:  [name] in today with chief complaint of chest congestion and SOB at night time   1. Bronchitis 1. Take meds as prescribed 2. Use a cool mist humidifier especially during the winter months and when heat has been humid. 3. Use saline nose sprays frequently 4. Saline irrigations of the nose can be very helpful if done frequently.  * 4X daily for 1 week*  * Use of a nettie pot can be helpful with this. Follow directions with this* 5. Drink plenty of fluids 6. Keep thermostat turn down low 7.For any cough or congestion  Use plain Mucinex- regular strength or max strength is fine   * Children- consult with Pharmacist for dosing 8. For fever or aces  or pains- take tylenol or ibuprofen appropriate for age and weight.  * for fevers greater than 101 orally you may alternate ibuprofen and tylenol every  3 hours.    - predniSONE (DELTASONE) 20 MG tablet; 2 po at sametime daily for 5 days  Dispense: 10 tablet; Refill: 0 - HYDROcodone-homatropine (HYCODAN) 5-1.5 MG/5ML syrup; Take 5 mLs by mouth every 6 (six) hours as needed for cough.  Dispense: 120 mL; Refill: 0   Mary-Margaret Hassell Done, FNP

## 2018-09-28 NOTE — Patient Instructions (Signed)

## 2018-10-12 ENCOUNTER — Encounter: Payer: Self-pay | Admitting: Nurse Practitioner

## 2018-10-12 ENCOUNTER — Ambulatory Visit (INDEPENDENT_AMBULATORY_CARE_PROVIDER_SITE_OTHER): Payer: Medicare Other | Admitting: Nurse Practitioner

## 2018-10-12 VITALS — BP 142/56 | HR 51 | Temp 98.2°F | Ht 67.0 in | Wt 161.0 lb

## 2018-10-12 DIAGNOSIS — F5101 Primary insomnia: Secondary | ICD-10-CM | POA: Diagnosis not present

## 2018-10-12 DIAGNOSIS — J0101 Acute recurrent maxillary sinusitis: Secondary | ICD-10-CM

## 2018-10-12 MED ORDER — CLONAZEPAM 0.5 MG PO TABS: 0.5000 mg | ORAL_TABLET | Freq: Two times a day (BID) | ORAL | 1 refills | Status: DC | PRN

## 2018-10-12 NOTE — Patient Instructions (Signed)

## 2018-10-12 NOTE — Progress Notes (Signed)
   Subjective:    Patient ID: LAWRIE TUNKS, female    DOB: 03/16/1932, 85 y.o.   MRN: [id number]   Chief Complaint: Anxiety (Husband was moved to Hospice home and she can't sleep) and Sinus Problem   HPI Patient comes in today with anxiety. 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 He started having problems swallowing and gradually started going down hill. He is now at hospice and she said he could die any minute. She says that she is unable to sleep. She also is still c/o sinus pressure in face. deneis any feer.    Review of Systems  Constitutional: Negative for chills and fever.  HENT: Positive for congestion, sinus pressure and sinus pain. Negative for sore throat and trouble swallowing.   Respiratory: Negative.  Negative for cough.   Cardiovascular: Negative.   Neurological: Negative.   Psychiatric/Behavioral: The patient is nervous/anxious.   All other systems reviewed and are negative.      Objective:   Physical Exam Vitals signs and nursing note reviewed.  Constitutional:      Appearance: Normal appearance. She is normal weight.  HENT:     Right Ear: Hearing, tympanic membrane, ear canal and external ear normal.     Left Ear: Hearing, tympanic membrane, ear canal and external ear normal.     Nose: Mucosal edema, congestion and rhinorrhea present.     Right Sinus: Maxillary sinus tenderness present.     Left Sinus: Maxillary sinus tenderness present.     Mouth/Throat:     Lips: Pink.     Pharynx: Oropharynx is clear. Uvula midline.  Cardiovascular:     Rate and Rhythm: Normal rate and regular rhythm.     Heart sounds: Normal heart sounds.  Pulmonary:     Effort: Pulmonary effort is normal.     Breath sounds: Normal breath sounds.  Abdominal:     General: Abdomen is flat. Bowel sounds are normal.     Palpations: Abdomen is soft.  Skin:    General: Skin is warm and dry.  Neurological:     General: No focal deficit present.     Mental Status: She is  alert and oriented to person, place, and time.  Psychiatric:        Mood and Affect: Mood normal.        Behavior: Behavior normal.    BP (!) 142/56   Pulse (!) 51   Temp 98.2 F (36.8 C) (Oral)   Ht 5\' 7"  (1.702 m)   Wt 161 lb (73 kg)   BMI 25.22 kg/m         Assessment & Plan:  [name] in today with chief complaint of Anxiety (Husband was moved to Hospice home and she can't sleep) and Sinus Problem   1. Primary insomnia Bedtime routine Be careful when getting up in middle of night due to sedation precautions - clonazePAM (KLONOPIN) 0.5 MG tablet; Take 1 tablet (0.5 mg total) by mouth 2 (two) times daily as needed for anxiety.  Dispense: 30 tablet; Refill: 1  2. Acute recurrent maxillary sinusitis flonase as rx claritin d OTC daily  Mary-Margaret Hassell Done, FNP

## 2018-10-22 ENCOUNTER — Ambulatory Visit (INDEPENDENT_AMBULATORY_CARE_PROVIDER_SITE_OTHER): Payer: Medicare Other | Admitting: Nurse Practitioner

## 2018-10-22 ENCOUNTER — Encounter: Payer: Self-pay | Admitting: Nurse Practitioner

## 2018-10-22 VITALS — BP 146/58 | HR 48 | Temp 97.6°F | Ht 67.0 in | Wt 162.0 lb

## 2018-10-22 DIAGNOSIS — K5909 Other constipation: Secondary | ICD-10-CM

## 2018-10-22 DIAGNOSIS — E119 Type 2 diabetes mellitus without complications: Secondary | ICD-10-CM

## 2018-10-22 DIAGNOSIS — M545 Low back pain, unspecified: Secondary | ICD-10-CM

## 2018-10-22 DIAGNOSIS — M8580 Other specified disorders of bone density and structure, unspecified site: Secondary | ICD-10-CM

## 2018-10-22 DIAGNOSIS — G8929 Other chronic pain: Secondary | ICD-10-CM | POA: Diagnosis not present

## 2018-10-22 DIAGNOSIS — I10 Essential (primary) hypertension: Secondary | ICD-10-CM

## 2018-10-22 DIAGNOSIS — R609 Edema, unspecified: Secondary | ICD-10-CM

## 2018-10-22 DIAGNOSIS — N183 Chronic kidney disease, stage 3 unspecified: Secondary | ICD-10-CM

## 2018-10-22 DIAGNOSIS — I5032 Chronic diastolic (congestive) heart failure: Secondary | ICD-10-CM

## 2018-10-22 DIAGNOSIS — K219 Gastro-esophageal reflux disease without esophagitis: Secondary | ICD-10-CM

## 2018-10-22 DIAGNOSIS — E782 Mixed hyperlipidemia: Secondary | ICD-10-CM | POA: Diagnosis not present

## 2018-10-22 DIAGNOSIS — E032 Hypothyroidism due to medicaments and other exogenous substances: Secondary | ICD-10-CM

## 2018-10-22 DIAGNOSIS — F5101 Primary insomnia: Secondary | ICD-10-CM

## 2018-10-22 DIAGNOSIS — I4819 Other persistent atrial fibrillation: Secondary | ICD-10-CM

## 2018-10-22 DIAGNOSIS — Z6826 Body mass index (BMI) 26.0-26.9, adult: Secondary | ICD-10-CM

## 2018-10-22 DIAGNOSIS — E876 Hypokalemia: Secondary | ICD-10-CM

## 2018-10-22 LAB — BAYER DCA HB A1C WAIVED: HB A1C (BAYER DCA - WAIVED): 6.9 % (ref <7.0)

## 2018-10-22 MED ORDER — METOPROLOL TARTRATE 25 MG PO TABS: 25.0000 mg | ORAL_TABLET | Freq: Two times a day (BID) | ORAL | 1 refills | Status: DC

## 2018-10-22 MED ORDER — RIVAROXABAN 15 MG PO TABS: ORAL_TABLET | ORAL | 1 refills | Status: DC

## 2018-10-22 MED ORDER — OMEPRAZOLE 40 MG PO CPDR: 40.0000 mg | DELAYED_RELEASE_CAPSULE | Freq: Every day | ORAL | 1 refills | Status: DC

## 2018-10-22 MED ORDER — CLONAZEPAM 0.5 MG PO TABS: 0.5000 mg | ORAL_TABLET | Freq: Two times a day (BID) | ORAL | 1 refills | Status: DC | PRN

## 2018-10-22 MED ORDER — LOVASTATIN 40 MG PO TABS: 80.0000 mg | ORAL_TABLET | Freq: Every day | ORAL | 1 refills | Status: DC

## 2018-10-22 MED ORDER — RALOXIFENE HCL 60 MG PO TABS: 60.0000 mg | ORAL_TABLET | Freq: Every day | ORAL | 1 refills | Status: DC

## 2018-10-22 MED ORDER — TRAMADOL HCL 50 MG PO TABS: ORAL_TABLET | ORAL | 2 refills | Status: DC

## 2018-10-22 MED ORDER — LEVOTHYROXINE SODIUM 50 MCG PO TABS: ORAL_TABLET | ORAL | 1 refills | Status: DC

## 2018-10-22 MED ORDER — FUROSEMIDE 40 MG PO TABS: ORAL_TABLET | ORAL | 1 refills | Status: DC

## 2018-10-22 MED ORDER — BENAZEPRIL HCL 40 MG PO TABS: ORAL_TABLET | ORAL | 1 refills | Status: DC

## 2018-10-22 MED ORDER — POTASSIUM CHLORIDE CRYS ER 20 MEQ PO TBCR: 20.0000 meq | EXTENDED_RELEASE_TABLET | Freq: Two times a day (BID) | ORAL | 1 refills | Status: DC

## 2018-10-22 NOTE — Addendum Note (Signed)
Addended by: Rolena Infante on: 10/22/2018 03:26 PM   Modules accepted: Orders

## 2018-10-22 NOTE — Addendum Note (Signed)
Addended by: [name] on: 10/22/2018 03:28 PM   Modules accepted: Orders

## 2018-10-22 NOTE — Progress Notes (Signed)
Subjective:    Patient ID: Katie [name], female    DOB: 1932/05/28, 85 y.o.   MRN: [id number]   Chief Complaint: medical management of chronic issues  HPI:  1. Essential hypertension  No c/o chest pain, sob or headache. Does not check blood pressure at home. BP Readings from Last 3 Encounters:  10/12/18 (!) 142/56  09/28/18 (!) 171/67  09/24/18 135/60     2. Mixed hyperlipidemia  Has had a very poor appetite lately.  3. Diabetes mellitus type 2, diet-controlled ([place])  Last hgba1c was 6.4%. she has not been checking blood sugar sat home lately  4. Hypothyroidism due to non-medication exogenous substances  Not having any problems that she is aware of.  5. Gastroesophageal reflux disease without esophagitis  Is on omperazole daily to prevent symptoms.  6. Persistent atrial fibrillation  No problems that she is aware . Deneis palpitations.  7. Chronic diastolic CHF (congestive heart failure) (HCC)  Denies any swelling or SOB  8. CKD (chronic kidney disease) stage 3, GFR 30-59 ml/min (HCC) Last creatine was 1.52. we will continue to monitor  9. Hypokalemia  Denies any lower ext cramping  10. Peripheral edema  No recent edema  11. BMI 26.0-26.9,adult  Has lost a couple of pounds the last few weeks  12. Other constipation  Doing ok right now. Constipation some better  13.    Chronic low back pain          Pain assessment: Cause of pain- strain from tugging and lifting on her husband Pain location- low back Pain on scale of 1-10- 3/10 Frequency- daily What increases pain-lots of activity What makes pain Better- rest an dheat Effects on ADL - does what she needs to Any change in general medical condition-none  Current opioids rx- ultram 46m bid # meds rx- 60 Effectiveness of current meds-helps Adverse reactions form pain meds-none Morphine equivalent- 0  Pill count performed-No Last drug screen - not done ( high risk q320mmoderate risk q6m46mow risk yearly  ) Urine drug screen today- Yes Was the [facility]- yes  If yes were their any concerning findings? - no     Pain contract signed on:04/06/19  Outpatient Encounter Medications as of 10/22/2018  Medication Sig  . benazepril (LOTENSIN) 40 MG tablet TAKE 1 & 1/2 (ONE & ONE-HALF) TABLETS BY MOUTH ONCE DAILY  . Cholecalciferol (VITAMIN D) 2000 UNITS CAPS Take 2,000 Units by mouth daily.   . clonazePAM (KLONOPIN) 0.5 MG tablet Take 1 tablet (0.5 mg total) by mouth 2 (two) times daily as needed for anxiety.  . eMarland Kitchenodolac (LODINE) 400 MG tablet TAKE 1 TABLET BY MOUTH TWO  TIMES DAILY  . furosemide (LASIX) 40 MG tablet TAKE ONE TABLET BY MOUTH IN THE MORNING DAILY. TAKE 1/2 TABLET BY MOUTH IN THE  AFTERNOON DAILY.  . lMarland Kitchenvothyroxine (SYNTHROID, LEVOTHROID) 50 MCG tablet TAKE 1 TABLET BY MOUTH  DAILY BEFORE BREAKFAST  . lovastatin (MEVACOR) 40 MG tablet Take 2 tablets (80 mg total) by mouth at bedtime.  . metoprolol tartrate (LOPRESSOR) 25 MG tablet Take 1 tablet (25 mg total) by mouth 2 (two) times daily.  . oMarland Kitcheneprazole (PRILOSEC) 40 MG capsule Take 1 capsule (40 mg total) by mouth daily.  . potassium chloride SA (K-DUR,KLOR-CON) 20 MEQ tablet Take 1 tablet (20 mEq total) by mouth 2 (two) times daily.  . raloxifene (EVISTA) 60 MG tablet Take 1 tablet (60 mg total) by mouth daily.  . traMADol (ULTRAM) 50 MG  tablet TAKE 1 TABLET BY MOUTH TWICE DAILY AS NEEDED FOR MODERATE PAIN OR SEVERE PAIN  . XARELTO 15 MG TABS tablet TAKE 1 TABLET BY MOUTH ONCE DAILY WITH SUPPER     New complaints: None today  Social history: Her husband passed away 2 weeks ago   Review of Systems  Constitutional: Negative for activity change and appetite change.  HENT: Negative.   Eyes: Negative for pain.  Respiratory: Negative for shortness of breath.   Cardiovascular: Negative for chest pain, palpitations and leg swelling.  Gastrointestinal: Negative for abdominal pain.  Endocrine: Negative for polydipsia.   Genitourinary: Negative.   Skin: Negative for rash.  Neurological: Negative for dizziness, weakness and headaches.  Hematological: Does not bruise/bleed easily.  Psychiatric/Behavioral: Negative.   All other systems reviewed and are negative.      Objective:   Physical Exam Vitals signs and nursing note reviewed.  Constitutional:      General: She is not in acute distress.    Appearance: Normal appearance. She is well-developed.  HENT:     Head: Normocephalic.     Nose: Nose normal.  Eyes:     Pupils: Pupils are equal, round, and reactive to light.  Neck:     Musculoskeletal: Normal range of motion and neck supple.     Vascular: No carotid bruit or JVD.  Cardiovascular:     Rate and Rhythm: Normal rate and regular rhythm.     Heart sounds: Normal heart sounds.  Pulmonary:     Effort: Pulmonary effort is normal. No respiratory distress.     Breath sounds: Normal breath sounds. No wheezing or rales.  Chest:     Chest wall: No tenderness.  Abdominal:     General: Bowel sounds are normal. There is no distension or abdominal bruit.     Palpations: Abdomen is soft. There is no hepatomegaly, splenomegaly, mass or pulsatile mass.     Tenderness: There is no abdominal tenderness.  Musculoskeletal: Normal range of motion.  Lymphadenopathy:     Cervical: No cervical adenopathy.  Skin:    General: Skin is warm and dry.  Neurological:     Mental Status: She is alert and oriented to person, place, and time.     Deep Tendon Reflexes: Reflexes are normal and symmetric.  Psychiatric:        Behavior: Behavior normal.        Thought Content: Thought content normal.        Judgment: Judgment normal.    BP (!) 146/58   Pulse (!) 48   Temp 97.6 F (36.4 C) (Oral)   Ht _0  (1.702 m)   Wt 162 lb (73.5 kg)   BMI 25.37 kg/m   hgba1c 6.9%     Assessment & Plan:  Katie [name] comes in today with chief complaint of Medical Management of Chronic Issues   Diagnosis and orders  addressed:  1. Essential hypertension Low sodium diet - CMP14+EGFR - benazepril (LOTENSIN) 40 MG tablet; TAKE 1 & 1/2 (ONE & ONE-HALF) TABLETS BY MOUTH ONCE DAILY  Dispense: 180 tablet; Refill: 1 - metoprolol tartrate (LOPRESSOR) 25 MG tablet; Take 1 tablet (25 mg total) by mouth 2 (two) times daily.  Dispense: 180 tablet; Refill: 1  2. Mixed hyperlipidemia Low fat diet - Lipid panel - lovastatin (MEVACOR) 40 MG tablet; Take 2 tablets (80 mg total) by mouth at bedtime.  Dispense: 180 tablet; Refill: 1  3. Diabetes mellitus type 2, diet-controlled ([place]) Continue to wtach  carbs in diet - Bayer DCA Hb A1c Waived  4. Hypothyroidism due to non-medication exogenous substances - metoprolol tartrate (LOPRESSOR) 25 MG tablet; Take 1 tablet (25 mg total) by mouth 2 (two) times daily.  Dispense: 180 tablet; Refill: 1 - levothyroxine (SYNTHROID, LEVOTHROID) 50 MCG tablet; TAKE 1 TABLET BY MOUTH  DAILY BEFORE BREAKFAST  Dispense: 90 tablet; Refill: 1  5. Gastroesophageal reflux disease without esophagitis Avoid spicy foods Do not eat 2 hours prior to bedtime - omeprazole (PRILOSEC) 40 MG capsule; Take 1 capsule (40 mg total) by mouth daily.  Dispense: 90 capsule; Refill: 1  6. Persistent atrial fibrillation - Rivaroxaban (XARELTO) 15 MG TABS tablet; TAKE 1 TABLET BY MOUTH ONCE DAILY WITH SUPPER  Dispense: 90 tablet; Refill: 1  7. Chronic diastolic CHF (congestive heart failure) ([place])  8. CKD (chronic kidney disease) stage 3, GFR 30-59 ml/min (HCC) lbas pending  9. Hypokalemia - potassium chloride SA (K-DUR,KLOR-CON) 20 MEQ tablet; Take 1 tablet (20 mEq total) by mouth 2 (two) times daily.  Dispense: 180 tablet; Refill: 1  10. Peripheral edema Elevate legs when sitting - furosemide (LASIX) 40 MG tablet; TAKE ONE TABLET BY MOUTH IN THE MORNING DAILY. TAKE 1/2 TABLET BY MOUTH IN THE  AFTERNOON DAILY.  Dispense: 135 tablet; Refill: 1  11. BMI 26.0-26.9,adult Discussed diet and exercise for  person with BMI >25 Will recheck weight in 3-6 months  12. Other constipation Increase fiber in diet  13. Osteopenia, senile - traMADol (ULTRAM) 50 MG tablet; TAKE 1 TABLET BY MOUTH TWICE DAILY AS NEEDED FOR MODERATE PAIN OR SEVERE PAIN  Dispense: 60 tablet; Refill: 2 - raloxifene (EVISTA) 60 MG tablet; Take 1 tablet (60 mg total) by mouth daily.  Dispense: 90 tablet; Refill: 1  14. Primary insomnia Bedtime routine - clonazePAM (KLONOPIN) 0.5 MG tablet; Take 1 tablet (0.5 mg total) by mouth 2 (two) times daily as needed for anxiety.  Dispense: 30 tablet; Refill: 1   Labs pending Health Maintenance reviewed Diet and exercise encouraged  Follow up plan: 3 months   Mary-Margaret Hassell Done, FNP

## 2018-10-22 NOTE — Patient Instructions (Signed)
Fall Prevention in the Home, Adult  Falls can cause injuries. They can happen to people of all ages. There are many things you can do to make your home safe and to help prevent falls. Ask for help when making these changes, if needed.  What actions can I take to prevent falls?  General Instructions  · Use good lighting in all rooms. Replace any light bulbs that burn out.  · Turn on the lights when you go into a dark area. Use night-lights.  · Keep items that you use often in easy-to-reach places. Lower the shelves around your home if necessary.  · Set up your furniture so you have a clear path. Avoid moving your furniture around.  · Do not have throw rugs and other things on the floor that can make you trip.  · Avoid walking on wet floors.  · If any of your floors are uneven, fix them.  · Add color or contrast paint or tape to clearly mark and help you see:  ? Any grab bars or handrails.  ? First and last steps of stairways.  ? Where the edge of each step is.  · If you use a stepladder:  ? Make sure that it is fully opened. Do not climb a closed stepladder.  ? Make sure that both sides of the stepladder are locked into place.  ? Ask someone to hold the stepladder for you while you use it.  · If there are any pets around you, be aware of where they are.  What can I do in the bathroom?         · Keep the floor dry. Clean up any water that spills onto the floor as soon as it happens.  · Remove soap buildup in the tub or shower regularly.  · Use non-skid mats or decals on the floor of the tub or shower.  · Attach bath mats securely with double-sided, non-slip rug tape.  · If you need to sit down in the shower, use a plastic, non-slip stool.  · Install grab bars by the toilet and in the tub and shower. Do not use towel bars as grab bars.  What can I do in the bedroom?  · Make sure that you have a light by your bed that is easy to reach.  · Do not use any sheets or blankets that are too big for your bed. They should  not hang down onto the floor.  · Have a firm chair that has side arms. You can use this for support while you get dressed.  What can I do in the kitchen?  · Clean up any spills right away.  · If you need to reach something above you, use a strong step stool that has a grab bar.  · Keep electrical cords out of the way.  · Do not use floor polish or wax that makes floors slippery. If you must use wax, use non-skid floor wax.  What can I do with my stairs?  · Do not leave any items on the stairs.  · Make sure that you have a light switch at the top of the stairs and the bottom of the stairs. If you do not have them, ask someone to add them for you.  · Make sure that there are handrails on both sides of the stairs, and use them. Fix handrails that are broken or loose. Make sure that handrails are as long as the stairways.  ·   Install non-slip stair treads on all stairs in your home.  · Avoid having throw rugs at the top or bottom of the stairs. If you do have throw rugs, attach them to the floor with carpet tape.  · Choose a carpet that does not hide the edge of the steps on the stairway.  · Check any carpeting to make sure that it is firmly attached to the stairs. Fix any carpet that is loose or worn.  What can I do on the outside of my home?  · Use bright outdoor lighting.  · Regularly fix the edges of walkways and driveways and fix any cracks.  · Remove anything that might make you trip as you walk through a door, such as a raised step or threshold.  · Trim any bushes or trees on the path to your home.  · Regularly check to see if handrails are loose or broken. Make sure that both sides of any steps have handrails.  · Install guardrails along the edges of any raised decks and porches.  · Clear walking paths of anything that might make someone trip, such as tools or rocks.  · Have any leaves, snow, or ice cleared regularly.  · Use sand or salt on walking paths during winter.  · Clean up any spills in your garage right  away. This includes grease or oil spills.  What other actions can I take?  · Wear shoes that:  ? Have a low heel. Do not wear high heels.  ? Have rubber bottoms.  ? Are comfortable and fit you well.  ? Are closed at the toe. Do not wear open-toe sandals.  · Use tools that help you move around (mobility aids) if they are needed. These include:  ? Canes.  ? Walkers.  ? Scooters.  ? Crutches.  · Review your medicines with your doctor. Some medicines can make you feel dizzy. This can increase your chance of falling.  Ask your doctor what other things you can do to help prevent falls.  Where to find more information  · Centers for Disease Control and Prevention, STEADI: https://cdc.gov  · National Institute on Aging: https://go4life.nia.nih.gov  Contact a doctor if:  · You are afraid of falling at home.  · You feel weak, drowsy, or dizzy at home.  · You fall at home.  Summary  · There are many simple things that you can do to make your home safe and to help prevent falls.  · Ways to make your home safe include removing tripping hazards and installing grab bars in the bathroom.  · Ask for help when making these changes in your home.  This information is not intended to replace advice given to you by your health care provider. Make sure you discuss any questions you have with your health care provider.  Document Released: 05/31/2009 Document Revised: 03/19/2017 Document Reviewed: 03/19/2017  Elsevier Interactive Patient Education © 2019 Elsevier Inc.

## 2018-10-22 NOTE — Progress Notes (Signed)
   Subjective:    Patient ID: Katie [name], female    DOB: 1932-05-27, 85 y.o.   MRN: [id number]     Review of Systems     Objective:   Physical Exam        Assessment & Plan:

## 2018-10-23 LAB — CMP14+EGFR
A/G RATIO: 1.6 (ref 1.2–2.2)
ALT: 11 IU/L (ref 0–32)
AST: 15 IU/L (ref 0–40)
Albumin: 3.9 g/dL (ref 3.6–4.6)
Alkaline Phosphatase: 75 IU/L (ref 39–117)
BILIRUBIN TOTAL: 0.4 mg/dL (ref 0.0–1.2)
BUN/Creatinine Ratio: 17 (ref 12–28)
BUN: 23 mg/dL (ref 8–27)
CO2: 23 mmol/L (ref 20–29)
Calcium: 8.9 mg/dL (ref 8.7–10.3)
Chloride: 103 mmol/L (ref 96–106)
Creatinine, Ser: 1.39 mg/dL — ABNORMAL HIGH (ref 0.57–1.00)
GFR calc Af Amer: 40 mL/min/{1.73_m2} — ABNORMAL LOW (ref >59)
GFR calc non Af Amer: 35 mL/min/{1.73_m2} — ABNORMAL LOW (ref >59)
GLOBULIN, TOTAL: 2.4 g/dL (ref 1.5–4.5)
Glucose: 105 mg/dL — ABNORMAL HIGH (ref 65–99)
POTASSIUM: 5 mmol/L (ref 3.5–5.2)
SODIUM: 141 mmol/L (ref 134–144)
Total Protein: 6.3 g/dL (ref 6.0–8.5)

## 2018-10-23 LAB — LIPID PANEL
Chol/HDL Ratio: 2.7 ratio (ref 0.0–4.4)
Cholesterol, Total: 149 mg/dL (ref 100–199)
HDL: 56 mg/dL (ref >39)
LDL Calculated: 70 mg/dL (ref 0–99)
Triglycerides: 114 mg/dL (ref 0–149)
VLDL Cholesterol Cal: 23 mg/dL (ref 5–40)

## 2018-10-26 DIAGNOSIS — Z1231 Encounter for screening mammogram for malignant neoplasm of breast: Secondary | ICD-10-CM | POA: Diagnosis not present

## 2018-10-26 LAB — HM MAMMOGRAPHY

## 2018-10-28 LAB — TOXASSURE SELECT 13 (MW), URINE

## 2018-11-02 ENCOUNTER — Other Ambulatory Visit: Payer: Self-pay

## 2018-11-02 DIAGNOSIS — Z7189 Other specified counseling: Secondary | ICD-10-CM

## 2018-11-10 ENCOUNTER — Ambulatory Visit: Payer: Self-pay | Admitting: Licensed Clinical Social Worker

## 2018-11-10 DIAGNOSIS — N183 Chronic kidney disease, stage 3 unspecified: Secondary | ICD-10-CM

## 2018-11-10 DIAGNOSIS — I5032 Chronic diastolic (congestive) heart failure: Secondary | ICD-10-CM

## 2018-11-10 DIAGNOSIS — F5101 Primary insomnia: Secondary | ICD-10-CM

## 2018-11-10 DIAGNOSIS — I10 Essential (primary) hypertension: Secondary | ICD-10-CM

## 2018-11-10 DIAGNOSIS — K219 Gastro-esophageal reflux disease without esophagitis: Secondary | ICD-10-CM

## 2018-11-10 NOTE — Patient Instructions (Signed)
Licensed Clinical Social Worker Visit Information  Materials provided: No  Ms. Mimnaugh was given information about Chronic Care Management services today including:  1. CCM service includes personalized support from designated clinical staff supervised by her physician, including individualized plan of care and coordination with other care providers 2. 24/7 contact phone numbers for assistance for urgent and routine care needs. 3. Service will only be billed when office clinical staff spend 20 minutes or more in a month to coordinate care. 4. Only one practitioner may furnish and bill the service in a calendar month. 5. The patient may stop CCM services at any time (effective at the end of the month) by phone call to the office staff. 6. The patient will be responsible for cost sharing (co-pay) of up to 20% of the service fee (after annual deductible is met).  Patient did not agree to services and does not wish to consider at this time.   Follow Up Plan: Client to attend scheduled client medical appointments with [name], FNP  The patient verbalized understanding of instructions provided today and declined a print copy of patient instruction materials.   Norva Riffle.Garth Diffley MSW, LCSW Licensed Clinical Social Worker [facility] [phone]

## 2018-11-10 NOTE — Chronic Care Management (AMB) (Addendum)
  Care Management Note   Katie [name] is a 85 y.o. year old female who is a primary care patient of [name], [place]. The CM team was consulted for assistance with chronic disease management and care coordination.   I reached out to [name] by phone today.   Ms. Longino was given information about Chronic Care Management services today including:  1. CCM service includes personalized support from designated clinical staff supervised by her physician, including individualized plan of care and coordination with other care providers 2. 24/7 contact phone numbers for assistance for urgent and routine care needs. 3. Service will only be billed when office clinical staff spend 20 minutes or more in a month to coordinate care. 4. Only one practitioner may furnish and bill the service in a calendar month. 5. The patient may stop CCM services at any time (effective at the end of the month) by phone call to the office staff. 6. The patient will be responsible for cost sharing (co-pay) of up to 20% of the service fee (after annual deductible is met). Patient did not agree to services and does not wish to consider at this time.   Review of patient status, including review of consultants reports, relevant laboratory and other test results, and collaboration with appropriate care team members and the patient's provider was performed as part of comprehensive patient evaluation and provision of chronic care management services.   Social Determinants of Health:  NOHEMY KOOP reported that her spouse died last month and she is having grief issues related to his death.  Client said she did not want to engage in CCM program at present but may consider this program in the future. She said she was familiar with local [facility] [place] [place] and would talk with [facility] about  grief support program/group.  She said she would consider CCM program support in the future. She  said [facility] had helped her spouse for the past two years in the home; thus, she plans to call [facility] to learn more about grief support services  Follow Up Plan: Client to attend scheduled client medical appointments with [name], [place].  Norva Riffle.Vineet Kinney MSW, LCSW Licensed Clinical Social Worker [place] [facility] [phone]  "I have reviewed this encounter including the documentation in this note and/or discussed this patient with the nurse coordinator, [facility] RN . I am certifying that I agree with the content of this note as supervising physician." [place], FNP

## 2018-12-08 ENCOUNTER — Other Ambulatory Visit: Payer: Self-pay | Admitting: Nurse Practitioner

## 2018-12-08 MED ORDER — ETODOLAC 400 MG PO TABS: 400.0000 mg | ORAL_TABLET | Freq: Two times a day (BID) | ORAL | 0 refills | Status: DC

## 2018-12-08 NOTE — Telephone Encounter (Signed)
What is the name of the medication? etodolac (LODINE) 400 MG tablet  Have you contacted your pharmacy to request a refill? yes  Which pharmacy would you like [facility] sent to? [facility]    Patient notified that their request is being sent to the clinical staff for review and that they should receive a call once it is complete. If they do not receive a call within 24 hours they can check with their pharmacy or our office.

## 2018-12-08 NOTE — Telephone Encounter (Signed)
Pt aware refill sent to mail order pharmacy 

## 2019-01-03 ENCOUNTER — Ambulatory Visit (INDEPENDENT_AMBULATORY_CARE_PROVIDER_SITE_OTHER): Payer: Medicare Other

## 2019-01-03 ENCOUNTER — Other Ambulatory Visit: Payer: Self-pay

## 2019-01-03 ENCOUNTER — Ambulatory Visit: Payer: Medicare Other | Admitting: Specialist

## 2019-01-03 ENCOUNTER — Encounter: Payer: Self-pay | Admitting: Specialist

## 2019-01-03 ENCOUNTER — Ambulatory Visit: Payer: Medicare Other

## 2019-01-03 VITALS — BP 181/67 | HR 46 | Ht 67.0 in | Wt 162.0 lb

## 2019-01-03 DIAGNOSIS — M545 Low back pain, unspecified: Secondary | ICD-10-CM

## 2019-01-03 DIAGNOSIS — M4156 Other secondary scoliosis, lumbar region: Secondary | ICD-10-CM | POA: Diagnosis not present

## 2019-01-03 DIAGNOSIS — M48062 Spinal stenosis, lumbar region with neurogenic claudication: Secondary | ICD-10-CM | POA: Diagnosis not present

## 2019-01-03 DIAGNOSIS — M25551 Pain in right hip: Secondary | ICD-10-CM

## 2019-01-03 NOTE — Progress Notes (Signed)
Office Visit Note   Patient: Katie [name]           Date of Birth: Oct 02, 1931           MRN: [id number] Visit Date: 01/03/2019              Requested by: [name], [address] [place], [place] [address] PCP: [name], FNP   Assessment & Plan: Visit Diagnoses:  1. Spinal stenosis of lumbar region with neurogenic claudication   2. Right low back pain, unspecified chronicity, unspecified whether sciatica present   3. Pain of right hip joint   4. Other secondary scoliosis, lumbar region     Plan: Avoid bending, stooping and avoid lifting weights greater than 10 lbs. Avoid prolong standing and walking. Avoid frequent bending and stooping  No lifting greater than 10 lbs. May use ice or moist heat for pain. Weight loss is of benefit. Handicap license is approved. Dr. Romona Curls secretary/Assistant will call to arrange for epidural steroid injection    Follow-Up Instructions: Return in about 4 weeks (around 01/31/2019).   Orders:  Orders Placed This Encounter  Procedures   XR HIP UNILAT W OR W/O PELVIS 2-3 VIEWS RIGHT   XR Lumbar Spine 2-3 Views   No orders of the defined types were placed in this encounter.     Procedures: No procedures performed   Clinical Data: No additional findings.   Subjective: Chief Complaint  Patient presents with   Right Hip - Pain    States pain is in the right buttock down into the posterior knee and down to the ankle.  She states she put down [place] needles but not enough to bother her back. Onset is 1 month.    23 year lold female with history of L4-5 decompression 2007 she has had increasing pain in the right buttock and radiation into the right posterior knee and down in the lateral right ankle. She has been caring for her husband for the last several years and unable to consider intervention and reports her husband passed away this past 10-15-2022 and she is still having difficulty with the right  buttock and right posterior knee pain. No bowel or bladder difficulty. She tends to lean on the push Cart when grocery shopping.  She can not walk one mile. She only walks in the grocery store. She has atrial fibrillation and is on xarelto.   Review of Systems  Constitutional: Negative.   HENT: Negative.   Eyes: Negative.   Respiratory: Negative.   Cardiovascular: Negative.   Gastrointestinal: Negative.   Endocrine: Negative.   Genitourinary: Negative.   Musculoskeletal: Negative.   Skin: Negative.   Allergic/Immunologic: Negative.   Neurological: Negative.   Hematological: Negative.   Psychiatric/Behavioral: Negative.      Objective: Vital Signs: BP (!) 181/67 (BP Location: Left Arm, Patient Position: Sitting)    Pulse (!) 46    Ht 5\' 7"  (1.702 m)    Wt 162 lb (73.5 kg)    BMI 25.37 kg/m   Physical Exam Constitutional:      Appearance: She is well-developed.  HENT:     Head: Normocephalic and atraumatic.  Eyes:     Pupils: Pupils are equal, round, and reactive to light.  Neck:     Musculoskeletal: Normal range of motion and neck supple.  Pulmonary:     Effort: Pulmonary effort is normal.     Breath sounds: Normal breath sounds.  Abdominal:     General:  Bowel sounds are normal.     Palpations: Abdomen is soft.  Skin:    General: Skin is warm and dry.  Neurological:     Mental Status: She is alert and oriented to person, place, and time.  Psychiatric:        Behavior: Behavior normal.        Thought Content: Thought content normal.        Judgment: Judgment normal.     Back Exam   Tenderness  The patient is experiencing tenderness in the lumbar.  Range of Motion  Extension: abnormal  Flexion: abnormal  Lateral bend right: normal  Lateral bend left: normal  Rotation right: normal  Rotation left: normal   Tests  Straight leg raise right: negative Straight leg raise left: negative  Reflexes  Babinski's sign: normal   Other  Toe walk: normal Heel  walk: normal Sensation: normal Gait: normal  Erythema: no back redness Scars: absent  Comments:  SLR in negative, motor with right EHL weakness but significant bunion.  Pain right buttock at the PSIS. And into the right posterior knee. Hip ROM is good with flexion almost to chest and internal rotation with some mild discomfort.       Specialty Comments:  No specialty comments available.  Imaging: Xr Hip Unilat W Or W/o Pelvis 2-3 Views Right  Result Date: 01/03/2019 Right hip AP standing and lateral show the right hip with mild inferomedial joint line narrowing and mild squaring off of the inferior head neck. Superior joint line is well maintained. No cystic changes and no subchondral sclerosis, mild SI joint changes. Mod DDD lower lumbar spine.   Xr Lumbar Spine 2-3 Views  Result Date: 01/03/2019 AP and lateral flexionand extension radiographs of the lumbar spine shows diffuse DDD L1-2 through L5-S1 right lumbar scoliosis with lateral listhesis of L4-5 and severe degenerative disc narrowing L5-S1.     PMFS History: Patient Active Problem List   Diagnosis Date Noted   Chronic midline low back pain without sciatica 10/22/2018   Primary insomnia 10/22/2018   Persistent atrial fibrillation 04/27/2017   Chronic diastolic CHF (congestive heart failure) ([facility]) 04/27/2017   CKD (chronic kidney disease) stage 3, GFR 30-59 ml/min (HCC) 05/14/2015   BMI 26.0-26.9,adult 05/11/2015   Peripheral edema 07/06/2014   Hypokalemia 07/22/2013   Hypothyroidism 07/22/2013   Osteopenia, senile 03/09/2013   Constipation 01/04/2013   Hypertension 05/07/2012   Hyperlipidemia 05/07/2012   Diabetes mellitus type 2, diet-controlled ([facility]) 05/07/2012   GERD (gastroesophageal reflux disease) 05/07/2012   Past Medical History:  Diagnosis Date   Aortic insufficiency    a. Trivial AI by echo 02/2016.   Atrial fibrillation and flutter ([facility])    a. Coarse afib vs flutter by EKG 12/2015.     Cataract    Chronic diastolic CHF (congestive heart failure) (HCC)    CKD (chronic kidney disease), stage III (HCC)    Edema    GERD (gastroesophageal reflux disease)    Hiatal hernia    Hypercholesterolemia    Hypertension    Hypokalemia    Hypothyroidism    Left knee pain    NIDDM (non-insulin dependent diabetes mellitus)    diet controlled    Obesity    Premature atrial contractions    PVC's (premature ventricular contractions)    URI (upper respiratory infection)    Vitamin D deficiency     Family History  Problem Relation Age of Onset   Diabetes Mother    Stroke Mother  Heart disease Father    Stroke Father    Uterine cancer Sister    Diabetes Sister    Ovarian cancer Sister    Colon cancer Sister    Diabetes Sister    Liver cancer Sister        \   Diabetes Brother    Dementia Brother    Atrial fibrillation Sister    Diabetes Sister    Diabetes Son    Heart attack Neg Hx    Hypertension Neg Hx     Past Surgical History:  Procedure Laterality Date   ABDOMINAL HYSTERECTOMY     APPENDECTOMY  1980   BACK SURGERY     CATARACT EXTRACTION, BILATERAL     CHOLECYSTECTOMY  5/00   COLONOSCOPY     TONSILLECTOMY     TOTAL ABDOMINAL HYSTERECTOMY W/ BILATERAL SALPINGOOPHORECTOMY  1980   UPPER GASTROINTESTINAL ENDOSCOPY     Social History   Occupational History   Occupation: Retired  Tobacco Use   Smoking status: Never Smoker   Smokeless tobacco: Never Used  Substance and Sexual Activity   Alcohol use: No   Drug use: No   Sexual activity: Not Currently

## 2019-01-03 NOTE — Patient Instructions (Signed)
Avoid bending, stooping and avoid lifting weights greater than 10 lbs. Avoid prolong standing and walking. Avoid frequent bending and stooping  No lifting greater than 10 lbs. May use ice or moist heat for pain. Weight loss is of benefit. Handicap license is approved. Dr. Newton's secretary/Assistant will call to arrange for epidural steroid injection  

## 2019-01-05 ENCOUNTER — Telehealth: Payer: Self-pay | Admitting: Cardiovascular Disease

## 2019-01-05 ENCOUNTER — Telehealth: Payer: Self-pay | Admitting: *Deleted

## 2019-01-05 NOTE — Telephone Encounter (Signed)
Follow up  ° ° °Pt is returning call  ° ° °Please call back  °

## 2019-01-05 NOTE — Telephone Encounter (Signed)
I placed call to pt to ask COVID 19 screening questions. Left message to call office.

## 2019-01-05 NOTE — Telephone Encounter (Signed)
I can probably do a virtual visit if we are able to do a Zio patch after the visit.

## 2019-01-05 NOTE — Telephone Encounter (Signed)
I spoke to the patient who is calling because her HR has been irregular lately.  She does not know what the rate has been, but says it lasts for about 30 minutes. She is asymptomatic and is due a f/u with Dr Angelena Form.  I told her that I would forward and have his nurse help to schedule.  She verbalized understanding.

## 2019-01-05 NOTE — Telephone Encounter (Signed)
New Message   Patient c/o Palpitations:  High priority if patient c/o lightheadedness, shortness of breath, or chest pain  1) How long have you had palpitations/irregular HR/ Afib? Are you having the symptoms now? Two or three nights ago. No symptoms  2) Are you currently experiencing lightheadedness, SOB or CP? no  3) Do you have a history of afib (atrial fibrillation) or irregular heart rhythm? yes  4) Have you checked your BP or HR? (document readings if available): no  5) Are you experiencing any other symptoms? No    Patient wanted to be scheduled for an appt but I was not sure if I should schedule her or not with the irregular heartbeats. Katie [name] does have a in office appt for 5/21 at 3:30pm if needed.

## 2019-01-05 NOTE — Telephone Encounter (Signed)
I spoke with Katie [name] and scheduled her to see Cecilie Kicks, NP on May 22,2020 at 10:30.  Katie [name] aware to wear mask.

## 2019-01-05 NOTE — Progress Notes (Signed)
Cardiology Office Note   Date:  01/07/2019   ID:  Katie [name], DOB 11/28/1931, MRN [id number]  PCP:  [name], FNP  Cardiologist:  Dr. Angelena Form     Chief Complaint  Patient presents with   Tachycardia      History of Present Illness: Katie [name] is a 85 y.o. female who presents for racing heart.   Last seen 04/2017.  She has a hx of HTN, HLD, DM, hypothyroidism and atrial fibrillation with CHADSVASC=3 on Xarelto, trivial AI, chronic diastolic CHF, CK D stage III. Last 2-D echo July 6063 normal LV systolic function. Lasix was increased for lower extremity edema. Last saw Dr.McAlhany 10/26/16 and blood pressure was up that day. She called back saying her blood pressures were low so her Norvasc was stopped.   Pt's husband died in 2022/10/26. And she has some issues since in addition to grief.  She has hx of a fib and some SVT. She is on anticoagulation.   She is having 2-3 episodes per month usually of rapid HR. The most recent was this week and it was fast rate then suddenly a zing and she thought she might pass out but did not.  None since.  Her HR today is 44 and she is fatigued.  Though she admits she is depressed about her husband's death.  She has not wanted to wear a monitor due to infrequency of tachycardia.   No chest pain or SOB. No colds or fevers or cough.       Past Medical History:  Diagnosis Date   Aortic insufficiency    a. Trivial AI by echo 02/2016.   Atrial fibrillation and flutter ([facility])    a. Coarse afib vs flutter by EKG 12/2015.   Cataract    Chronic diastolic CHF (congestive heart failure) (HCC)    CKD (chronic kidney disease), stage III (HCC)    Edema    GERD (gastroesophageal reflux disease)    Hiatal hernia    Hypercholesterolemia    Hypertension    Hypokalemia    Hypothyroidism    Left knee pain    NIDDM (non-insulin dependent diabetes mellitus)    diet controlled    Obesity    Premature atrial contractions    PVC's  (premature ventricular contractions)    URI (upper respiratory infection)    Vitamin D deficiency     Past Surgical History:  Procedure Laterality Date   ABDOMINAL HYSTERECTOMY     APPENDECTOMY  1980   BACK SURGERY     CATARACT EXTRACTION, BILATERAL     CHOLECYSTECTOMY  5/00   COLONOSCOPY     TONSILLECTOMY     TOTAL ABDOMINAL HYSTERECTOMY W/ BILATERAL SALPINGOOPHORECTOMY  1980   UPPER GASTROINTESTINAL ENDOSCOPY       Current Outpatient Medications  Medication Sig Dispense Refill   benazepril (LOTENSIN) 40 MG tablet TAKE 1 & 1/2 (ONE & ONE-HALF) TABLETS BY MOUTH ONCE DAILY 180 tablet 1   clonazePAM (KLONOPIN) 0.5 MG tablet Take 1 tablet (0.5 mg total) by mouth 2 (two) times daily as needed for anxiety. 30 tablet 1   etodolac (LODINE) 400 MG tablet Take 400 mg by mouth daily.     furosemide (LASIX) 40 MG tablet TAKE ONE TABLET BY MOUTH IN THE MORNING DAILY. TAKE 1/2 TABLET BY MOUTH IN THE  AFTERNOON DAILY. 135 tablet 1   levothyroxine (SYNTHROID, LEVOTHROID) 50 MCG tablet TAKE 1 TABLET BY MOUTH  DAILY BEFORE BREAKFAST 90 tablet 1   lovastatin (  MEVACOR) 40 MG tablet Take 2 tablets (80 mg total) by mouth at bedtime. 180 tablet 1   metoprolol tartrate (LOPRESSOR) 25 MG tablet Take 1 tablet (25 mg total) by mouth 2 (two) times daily. 180 tablet 1   omeprazole (PRILOSEC) 40 MG capsule Take 1 capsule (40 mg total) by mouth daily. 90 capsule 1   potassium chloride SA (K-DUR,KLOR-CON) 20 MEQ tablet Take 1 tablet (20 mEq total) by mouth 2 (two) times daily. 180 tablet 1   raloxifene (EVISTA) 60 MG tablet Take 1 tablet (60 mg total) by mouth daily. 90 tablet 1   Rivaroxaban (XARELTO) 15 MG TABS tablet TAKE 1 TABLET BY MOUTH ONCE DAILY WITH SUPPER 90 tablet 1   traMADol (ULTRAM) 50 MG tablet TAKE 1 TABLET BY MOUTH TWICE DAILY AS NEEDED FOR MODERATE PAIN OR SEVERE PAIN 60 tablet 2   No current facility-administered medications for this visit.     Allergies:   Amlodipine;  Macrodantin; Metformin and related; and Penicillins    Social History:  The patient  reports that she has never smoked. She has never used smokeless tobacco. She reports that she does not drink alcohol or use drugs.   Family History:  The patient's family history includes Atrial fibrillation in her sister; Colon cancer in her sister; Dementia in her brother; Diabetes in her brother, mother, sister, sister, sister, and son; Heart disease in her father; Liver cancer in her sister; Ovarian cancer in her sister; Stroke in her father and mother; Uterine cancer in her sister.    ROS:  General:no colds or fevers, no weight changes Skin:no rashes or ulcers HEENT:no blurred vision, no congestion CV:see HPI PUL:see HPI GI:no diarrhea constipation or melena, no indigestion GU:no hematuria, no dysuria MS:no joint pain, no claudication Neuro:no syncope, no lightheadedness Endo:no diabetes, + thyroid disease  Wt Readings from Last 3 Encounters:  01/07/19 164 lb 1.9 oz (74.4 kg)  01/03/19 162 lb (73.5 kg)  10/22/18 162 lb (73.5 kg)     PHYSICAL EXAM: VS:  BP 126/62    Pulse (!) 44    Ht 5\' 7"  (1.702 m)    Wt 164 lb 1.9 oz (74.4 kg)    SpO2 96%    BMI 25.70 kg/m  , BMI Body mass index is 25.7 kg/m. General:Pleasant affect, NAD Skin:Warm and dry, brisk capillary refill HEENT:normocephalic, sclera clear, mucus membranes moist Neck:supple, no JVD, no bruits  Heart:S1S2 RRR slow without murmur, gallup, rub or click Lungs:clear without rales, rhonchi, or wheezes YQM:VHQI, non tender, + BS, do not palpate liver spleen or masses Ext:no lower ext edema, 2+ pedal pulses, 2+ radial pulses Neuro:alert and oriented X 3, MAE, follows commands, + facial symmetry    EKG:  EKG is ordered today. The ekg ordered today demonstrates sinus brady at 44 otherwise no ST changes.    Recent Labs: 07/21/2018: Hemoglobin 10.8; Platelets 188 10/22/2018: ALT 11; BUN 23; Creatinine, Ser 1.39; Potassium 5.0; Sodium 141      Lipid Panel    Component Value Date/Time   CHOL 149 10/22/2018 1537   CHOL 158 01/04/2013 0905   TRIG 114 10/22/2018 1537   TRIG 125 10/18/2014 1435   TRIG 184 (H) 01/04/2013 0905   HDL 56 10/22/2018 1537   HDL 54 10/18/2014 1435   HDL 50 01/04/2013 0905   CHOLHDL 2.7 10/22/2018 1537   LDLCALC 70 10/22/2018 1537   LDLCALC 80 03/15/2014 1024   LDLCALC 71 01/04/2013 0905       Other  studies Reviewed: Additional studies/ records that were reviewed today include: . Echo 03/10/16: Left ventricle: The cavity size was normal. Systolic function was normal. The estimated ejection fraction was in the range of 60% to 65%. Doppler parameters are consistent with abnormal left ventricular relaxation (grade 1 diastolic dysfunction). - Aortic valve: There was trivial regurgitation. - Mitral valve: There was mild regurgitation. - Pulmonary arteries: PA peak pressure: 33 mm Hg (S).    ASSESSMENT AND PLAN:  1.  Tachy brady - discussed HR slow with medication but still with tachycardia. Usually 2 episodes per month if that.  She is fatigued as well.   We briefly discussed pacemakers.  Her sister has one.  Will ask EP to see for need 30 day monitor vs. Loop.  Will message Dr. Angelena Form as well.   Will check labs TSH, BMP, cbc and get Echo.   2.  PAF on xarelto she also has had SVT.   3. anticoagulation on xarelto.    4.  HTN controlled  5.  Chronic diastolic HF - euvolemic today.    Current medicines are reviewed with the patient today.  The patient Has no concerns regarding medicines.  The following changes have been made:  See above Labs/ tests ordered today include:see above  Disposition:   FU:  see above  Signed, Cecilie Kicks, NP  01/07/2019 10:53 AM    [facility], [place], [place], [place] Phone: [phone]; Fax: [phone]

## 2019-01-05 NOTE — Telephone Encounter (Signed)
    COVID-19 Pre-Screening Questions:  . In the past 7 to 10 days have you had a cough,  shortness of breath, headache, congestion, fever (100 or greater) body aches, chills, sore throat, or sudden loss of taste or sense of smell? no . Have you been around anyone with known Covid 19.-no . Have you been around anyone who is awaiting Covid 19 test results in the past 7 to 10 days?-no . Have you been around anyone who has been exposed to Covid 19, or has mentioned symptoms of Covid 19 within the past 7 to 10 days? no  Pt aware to wear mask to appointment

## 2019-01-05 NOTE — Telephone Encounter (Signed)
If she is unwilling to do a virtual visit then we can get her on either with an APP in the office or with the DOD one day this week. Thanks, chris

## 2019-01-05 NOTE — Telephone Encounter (Signed)
I spoke with pt and gave her information from Dr. Angelena Form.  She does not have smart phone. I offered appt with PA as early as this afternoon for a phone visit.  Pt states she is not interested in a phone visit and she has worn a monitor in the past. She wants to come in for in person office visit. Pt is not having any symptoms at current time. States the other day when her heart was racing she felt like she might pass out and had to lay down until she felt better.  I advised pt to call EMS when she felt poorly. Will check with Dr. Angelena Form regarding type of appt indicated for pt.  She is willing to see APP.

## 2019-01-07 ENCOUNTER — Other Ambulatory Visit: Payer: Self-pay

## 2019-01-07 ENCOUNTER — Encounter: Payer: Self-pay | Admitting: Cardiology

## 2019-01-07 ENCOUNTER — Ambulatory Visit: Payer: Medicare Other | Admitting: Cardiology

## 2019-01-07 VITALS — BP 126/62 | HR 44 | Ht 67.0 in | Wt 164.1 lb

## 2019-01-07 DIAGNOSIS — I480 Paroxysmal atrial fibrillation: Secondary | ICD-10-CM | POA: Diagnosis not present

## 2019-01-07 DIAGNOSIS — I10 Essential (primary) hypertension: Secondary | ICD-10-CM

## 2019-01-07 DIAGNOSIS — I495 Sick sinus syndrome: Secondary | ICD-10-CM | POA: Diagnosis not present

## 2019-01-07 DIAGNOSIS — Z7901 Long term (current) use of anticoagulants: Secondary | ICD-10-CM

## 2019-01-07 DIAGNOSIS — I5032 Chronic diastolic (congestive) heart failure: Secondary | ICD-10-CM

## 2019-01-07 DIAGNOSIS — R001 Bradycardia, unspecified: Secondary | ICD-10-CM

## 2019-01-07 NOTE — Patient Instructions (Addendum)
Medication Instructions:  Your physician recommends that you continue on your current medications as directed. Please refer to the Current Medication list given to you today.  If you need a refill on your cardiac medications before your next appointment, please call your pharmacy.   Lab work: TODAY:  BMET, CBC, TSH, & MAG  If you have labs (blood work) drawn today and your tests are completely normal, you will receive your results only by: [name] MyChart Message (if you have MyChart) OR . A paper copy in the mail If you have any lab test that is abnormal or we need to change your treatment, we will call you to review the results.  Testing/Procedures: Your physician has requested that you have an echocardiogram. Echocardiography is a painless test that uses sound waves to create images of your heart. It provides your doctor with information about the size and shape of your heart and how well your heart's chambers and valves are working. This procedure takes approximately one hour. There are no restrictions for this procedure.    You have been referred to ELECTROPHYSIOLOGY.  THEY WILL CALL YOU TO SCHEDULE THIS APPT.   Follow-Up: Your physician recommends that you schedule a follow-up appointment in: 3 MONTHS WITH DR. Angelena Form  Any Other Special Instructions Will Be Listed Below (If Applicable).   Echocardiogram An echocardiogram is a procedure that uses painless sound waves (ultrasound) to produce an image of the heart. Images from an echocardiogram can provide important information about:  Signs of coronary artery disease (CAD).  Aneurysm detection. An aneurysm is a weak or damaged part of an artery wall that bulges out from the normal force of blood pumping through the body.  Heart size and shape. Changes in the size or shape of the heart can be associated with certain conditions, including heart failure, aneurysm, and CAD.  Heart muscle function.  Heart valve function.  Signs of a  past heart attack.  Fluid buildup around the heart.  Thickening of the heart muscle.  A tumor or infectious growth around the heart valves. Tell a health care provider about:  Any allergies you have.  All medicines you are taking, including vitamins, herbs, eye drops, creams, and over-the-counter medicines.  Any blood disorders you have.  Any surgeries you have had.  Any medical conditions you have.  Whether you are pregnant or may be pregnant. What are the risks? Generally, this is a safe procedure. However, problems may occur, including:  Allergic reaction to dye (contrast) that may be used during the procedure. What happens before the procedure? No specific preparation is needed. You may eat and drink normally. What happens during the procedure?   An IV tube may be inserted into one of your veins.  You may receive contrast through this tube. A contrast is an injection that improves the quality of the pictures from your heart.  A gel will be applied to your chest.  A wand-like tool (transducer) will be moved over your chest. The gel will help to transmit the sound waves from the transducer.  The sound waves will harmlessly bounce off of your heart to allow the heart images to be captured in real-time motion. The images will be recorded on a computer. The procedure may vary among health care providers and hospitals. What happens after the procedure?  You may return to your normal, everyday life, including diet, activities, and medicines, unless your health care provider tells you not to do that. Summary  An echocardiogram is  a procedure that uses painless sound waves (ultrasound) to produce an image of the heart.  Images from an echocardiogram can provide important information about the size and shape of your heart, heart muscle function, heart valve function, and fluid buildup around your heart.  You do not need to do anything to prepare before this procedure. You  may eat and drink normally.  After the echocardiogram is completed, you may return to your normal, everyday life, unless your health care provider tells you not to do that. This information is not intended to replace advice given to you by your health care provider. Make sure you discuss any questions you have with your health care provider. Document Released: 08/01/2000 Document Revised: 09/06/2016 Document Reviewed: 09/06/2016 Elsevier Interactive Patient Education  2019 [employer].

## 2019-01-08 LAB — MAGNESIUM: Magnesium: 2.1 mg/dL (ref 1.6–2.3)

## 2019-01-08 LAB — CBC
Hematocrit: 33.5 % — ABNORMAL LOW (ref 34.0–46.6)
Hemoglobin: 11.7 g/dL (ref 11.1–15.9)
MCH: 35.2 pg — ABNORMAL HIGH (ref 26.6–33.0)
MCHC: 34.9 g/dL (ref 31.5–35.7)
MCV: 101 fL — ABNORMAL HIGH (ref 79–97)
Platelets: 211 10*3/uL (ref 150–450)
RBC: 3.32 x10E6/uL — ABNORMAL LOW (ref 3.77–5.28)
RDW: 13.2 % (ref 11.7–15.4)
WBC: 7 10*3/uL (ref 3.4–10.8)

## 2019-01-08 LAB — BASIC METABOLIC PANEL
BUN/Creatinine Ratio: 16 (ref 12–28)
BUN: 20 mg/dL (ref 8–27)
CO2: 25 mmol/L (ref 20–29)
Calcium: 9.2 mg/dL (ref 8.7–10.3)
Chloride: 102 mmol/L (ref 96–106)
Creatinine, Ser: 1.27 mg/dL — ABNORMAL HIGH (ref 0.57–1.00)
GFR calc Af Amer: 44 mL/min/{1.73_m2} — ABNORMAL LOW (ref >59)
GFR calc non Af Amer: 39 mL/min/{1.73_m2} — ABNORMAL LOW (ref >59)
Glucose: 112 mg/dL — ABNORMAL HIGH (ref 65–99)
Potassium: 4.4 mmol/L (ref 3.5–5.2)
Sodium: 141 mmol/L (ref 134–144)

## 2019-01-08 LAB — TSH: TSH: 5.42 u[IU]/mL — ABNORMAL HIGH (ref 0.450–4.500)

## 2019-01-12 ENCOUNTER — Telehealth: Payer: Self-pay | Admitting: Specialist

## 2019-01-12 ENCOUNTER — Other Ambulatory Visit (INDEPENDENT_AMBULATORY_CARE_PROVIDER_SITE_OTHER): Payer: Self-pay | Admitting: Specialist

## 2019-01-12 MED ORDER — METHYLPREDNISOLONE 4 MG PO TBPK: ORAL_TABLET | ORAL | 0 refills | Status: DC

## 2019-01-12 NOTE — Telephone Encounter (Signed)
Rx sent to [facility]. jen

## 2019-01-12 NOTE — Telephone Encounter (Signed)
Patient has been scheduled for ESI on 02/02/2019, she is requesting a rx for the Oral Prednisone be sent to [facility], [place]---please advise

## 2019-01-19 ENCOUNTER — Other Ambulatory Visit: Payer: Self-pay | Admitting: *Deleted

## 2019-01-19 DIAGNOSIS — F5101 Primary insomnia: Secondary | ICD-10-CM

## 2019-01-19 MED ORDER — CLONAZEPAM 0.5 MG PO TABS: 0.5000 mg | ORAL_TABLET | Freq: Two times a day (BID) | ORAL | 1 refills | Status: DC | PRN

## 2019-01-21 ENCOUNTER — Other Ambulatory Visit (INDEPENDENT_AMBULATORY_CARE_PROVIDER_SITE_OTHER): Payer: Self-pay | Admitting: Specialist

## 2019-01-21 ENCOUNTER — Other Ambulatory Visit: Payer: Self-pay

## 2019-01-24 ENCOUNTER — Other Ambulatory Visit: Payer: Self-pay

## 2019-01-24 ENCOUNTER — Encounter: Payer: Self-pay | Admitting: Nurse Practitioner

## 2019-01-24 ENCOUNTER — Ambulatory Visit (INDEPENDENT_AMBULATORY_CARE_PROVIDER_SITE_OTHER): Payer: Medicare Other | Admitting: Nurse Practitioner

## 2019-01-24 VITALS — BP 164/69 | HR 51 | Temp 98.3°F | Ht 67.0 in | Wt 168.0 lb

## 2019-01-24 DIAGNOSIS — E119 Type 2 diabetes mellitus without complications: Secondary | ICD-10-CM | POA: Diagnosis not present

## 2019-01-24 DIAGNOSIS — E785 Hyperlipidemia, unspecified: Secondary | ICD-10-CM | POA: Diagnosis not present

## 2019-01-24 DIAGNOSIS — M5441 Lumbago with sciatica, right side: Secondary | ICD-10-CM

## 2019-01-24 DIAGNOSIS — I5032 Chronic diastolic (congestive) heart failure: Secondary | ICD-10-CM | POA: Diagnosis not present

## 2019-01-24 DIAGNOSIS — F5101 Primary insomnia: Secondary | ICD-10-CM

## 2019-01-24 DIAGNOSIS — E876 Hypokalemia: Secondary | ICD-10-CM | POA: Diagnosis not present

## 2019-01-24 DIAGNOSIS — I10 Essential (primary) hypertension: Secondary | ICD-10-CM

## 2019-01-24 DIAGNOSIS — R609 Edema, unspecified: Secondary | ICD-10-CM

## 2019-01-24 DIAGNOSIS — I4819 Other persistent atrial fibrillation: Secondary | ICD-10-CM

## 2019-01-24 DIAGNOSIS — E032 Hypothyroidism due to medicaments and other exogenous substances: Secondary | ICD-10-CM

## 2019-01-24 DIAGNOSIS — K5909 Other constipation: Secondary | ICD-10-CM

## 2019-01-24 DIAGNOSIS — M549 Dorsalgia, unspecified: Secondary | ICD-10-CM | POA: Insufficient documentation

## 2019-01-24 DIAGNOSIS — K219 Gastro-esophageal reflux disease without esophagitis: Secondary | ICD-10-CM

## 2019-01-24 DIAGNOSIS — Z6826 Body mass index (BMI) 26.0-26.9, adult: Secondary | ICD-10-CM

## 2019-01-24 LAB — BAYER DCA HB A1C WAIVED: HB A1C (BAYER DCA - WAIVED): 6.4 % (ref <7.0)

## 2019-01-24 MED ORDER — PREDNISONE 20 MG PO TABS: ORAL_TABLET | ORAL | 0 refills | Status: DC

## 2019-01-24 NOTE — Patient Instructions (Signed)
Fall Prevention in the Home, Adult  Falls can cause injuries. They can happen to people of all ages. There are many things you can do to make your home safe and to help prevent falls. Ask for help when making these changes, if needed.  What actions can I take to prevent falls?  General Instructions  · Use good lighting in all rooms. Replace any light bulbs that burn out.  · Turn on the lights when you go into a dark area. Use night-lights.  · Keep items that you use often in easy-to-reach places. Lower the shelves around your home if necessary.  · Set up your furniture so you have a clear path. Avoid moving your furniture around.  · Do not have throw rugs and other things on the floor that can make you trip.  · Avoid walking on wet floors.  · If any of your floors are uneven, fix them.  · Add color or contrast paint or tape to clearly mark and help you see:  ? Any grab bars or handrails.  ? First and last steps of stairways.  ? Where the edge of each step is.  · If you use a stepladder:  ? Make sure that it is fully opened. Do not climb a closed stepladder.  ? Make sure that both sides of the stepladder are locked into place.  ? Ask someone to hold the stepladder for you while you use it.  · If there are any pets around you, be aware of where they are.  What can I do in the bathroom?         · Keep the floor dry. Clean up any water that spills onto the floor as soon as it happens.  · Remove soap buildup in the tub or shower regularly.  · Use non-skid mats or decals on the floor of the tub or shower.  · Attach bath mats securely with double-sided, non-slip rug tape.  · If you need to sit down in the shower, use a plastic, non-slip stool.  · Install grab bars by the toilet and in the tub and shower. Do not use towel bars as grab bars.  What can I do in the bedroom?  · Make sure that you have a light by your bed that is easy to reach.  · Do not use any sheets or blankets that are too big for your bed. They should  not hang down onto the floor.  · Have a firm chair that has side arms. You can use this for support while you get dressed.  What can I do in the kitchen?  · Clean up any spills right away.  · If you need to reach something above you, use a strong step stool that has a grab bar.  · Keep electrical cords out of the way.  · Do not use floor polish or wax that makes floors slippery. If you must use wax, use non-skid floor wax.  What can I do with my stairs?  · Do not leave any items on the stairs.  · Make sure that you have a light switch at the top of the stairs and the bottom of the stairs. If you do not have them, ask someone to add them for you.  · Make sure that there are handrails on both sides of the stairs, and use them. Fix handrails that are broken or loose. Make sure that handrails are as long as the stairways.  ·   Install non-slip stair treads on all stairs in your home.  · Avoid having throw rugs at the top or bottom of the stairs. If you do have throw rugs, attach them to the floor with carpet tape.  · Choose a carpet that does not hide the edge of the steps on the stairway.  · Check any carpeting to make sure that it is firmly attached to the stairs. Fix any carpet that is loose or worn.  What can I do on the outside of my home?  · Use bright outdoor lighting.  · Regularly fix the edges of walkways and driveways and fix any cracks.  · Remove anything that might make you trip as you walk through a door, such as a raised step or threshold.  · Trim any bushes or trees on the path to your home.  · Regularly check to see if handrails are loose or broken. Make sure that both sides of any steps have handrails.  · Install guardrails along the edges of any raised decks and porches.  · Clear walking paths of anything that might make someone trip, such as tools or rocks.  · Have any leaves, snow, or ice cleared regularly.  · Use sand or salt on walking paths during winter.  · Clean up any spills in your garage right  away. This includes grease or oil spills.  What other actions can I take?  · Wear shoes that:  ? Have a low heel. Do not wear high heels.  ? Have rubber bottoms.  ? Are comfortable and fit you well.  ? Are closed at the toe. Do not wear open-toe sandals.  · Use tools that help you move around (mobility aids) if they are needed. These include:  ? Canes.  ? Walkers.  ? Scooters.  ? Crutches.  · Review your medicines with your doctor. Some medicines can make you feel dizzy. This can increase your chance of falling.  Ask your doctor what other things you can do to help prevent falls.  Where to find more information  · Centers for Disease Control and Prevention, STEADI: https://cdc.gov  · National Institute on Aging: https://go4life.nia.nih.gov  Contact a doctor if:  · You are afraid of falling at home.  · You feel weak, drowsy, or dizzy at home.  · You fall at home.  Summary  · There are many simple things that you can do to make your home safe and to help prevent falls.  · Ways to make your home safe include removing tripping hazards and installing grab bars in the bathroom.  · Ask for help when making these changes in your home.  This information is not intended to replace advice given to you by your health care provider. Make sure you discuss any questions you have with your health care provider.  Document Released: 05/31/2009 Document Revised: 03/19/2017 Document Reviewed: 03/19/2017  Elsevier Interactive Patient Education © 2019 Elsevier Inc.

## 2019-01-24 NOTE — Progress Notes (Signed)
 Subjective:    Patient ID: Katie [name], female    DOB: 08/04/1932, 85 y.o.   MRN: [id number]   Chief Complaint: medical management of chronic issues   HPI:  1. Essential hypertension No c/o chest pain, sob or headache. Does not check blood pressure at home.  BP Readings from Last 3 Encounters:  01/07/19 126/62  01/03/19 (!) 181/67  10/22/18 (!) 146/58     2. Chronic diastolic CHF (congestive heart failure) (HCC) She denies any sob. Has mild lower ext edema by the end of the day.  3. Hypothyroidism due to non-medication exogenous substances No problems that she is aware of. Last TSH on 01/07/19 was 5.4. we will recheck it today.  4. Hyperlipidemia, unspecified hyperlipidemia type Not really watching diet. Has a very poor appetite. Does noexercise  5. Hypokalemia denies any lower ext cramping  6. Peripheral edema Has some edema at the end of the day but will usually resolve at night  7. Primary insomnia Usually saves a klonopin to take at night so she rests better  8. Diabetes mellitus type 2, diet-controlled (HCC) Last hgba1c was 6.9.  SHe does not check her blood sugars very often. Denies any symptoms of hypoglycemia.  9. Gastroesophageal reflux disease without esophagitis Takes omperazole daily which seems to work well for symptoms  10. Persistent atrial fibrillation She denies any heart palpitations. She saw cardiology on 01/07/19. According to note they are going to put her on a monitor to monitor her heart rate because she goes from tachy to brady. Discussed possible pacemaker with her. sje says she is scheduled for echo cardiogram this Friday.  11. Other constipation Is some better with diet. She takes laxative when needed  12. BMI 26.0-26.9,adult No recent weight chnages    Outpatient Encounter Medications as of 01/24/2019  Medication Sig  . benazepril (LOTENSIN) 40 MG tablet TAKE 1 & 1/2 (ONE & ONE-HALF) TABLETS BY MOUTH ONCE DAILY  . clonazePAM  (KLONOPIN) 0.5 MG tablet Take 1 tablet (0.5 mg total) by mouth 2 (two) times daily as needed for anxiety.  . etodolac (LODINE) 400 MG tablet Take 400 mg by mouth daily.  . furosemide (LASIX) 40 MG tablet TAKE ONE TABLET BY MOUTH IN THE MORNING DAILY. TAKE 1/2 TABLET BY MOUTH IN THE  AFTERNOON DAILY.  . levothyroxine (SYNTHROID, LEVOTHROID) 50 MCG tablet TAKE 1 TABLET BY MOUTH  DAILY BEFORE BREAKFAST  . lovastatin (MEVACOR) 40 MG tablet Take 2 tablets (80 mg total) by mouth at bedtime.  . methylPREDNISolone (MEDROL DOSEPAK) 4 MG TBPK tablet Take as directed.  . metoprolol tartrate (LOPRESSOR) 25 MG tablet Take 1 tablet (25 mg total) by mouth 2 (two) times daily.  . omeprazole (PRILOSEC) 40 MG capsule Take 1 capsule (40 mg total) by mouth daily.  . potassium chloride SA (K-DUR,KLOR-CON) 20 MEQ tablet Take 1 tablet (20 mEq total) by mouth 2 (two) times daily.  . raloxifene (EVISTA) 60 MG tablet Take 1 tablet (60 mg total) by mouth daily.  . Rivaroxaban (XARELTO) 15 MG TABS tablet TAKE 1 TABLET BY MOUTH ONCE DAILY WITH SUPPER  . traMADol (ULTRAM) 50 MG tablet TAKE 1 TABLET BY MOUTH TWICE DAILY AS NEEDED FOR MODERATE PAIN OR SEVERE PAIN     Past Surgical History:  Procedure Laterality Date  . ABDOMINAL HYSTERECTOMY    . APPENDECTOMY  1980  . BACK SURGERY    . CATARACT EXTRACTION, BILATERAL    . CHOLECYSTECTOMY  5/00  . COLONOSCOPY    .   TONSILLECTOMY    . TOTAL ABDOMINAL HYSTERECTOMY W/ BILATERAL SALPINGOOPHORECTOMY  1980  . UPPER GASTROINTESTINAL ENDOSCOPY      Family History  Problem Relation Age of Onset  . Diabetes Mother   . Stroke Mother   . Heart disease Father   . Stroke Father   . Uterine cancer Sister   . Diabetes Sister   . Ovarian cancer Sister   . Colon cancer Sister   . Diabetes Sister   . Liver cancer Sister        _0  . Diabetes Brother   . Dementia Brother   . Atrial fibrillation Sister   . Diabetes Sister   . Diabetes Son   . Heart attack Neg Hx   .  Hypertension Neg Hx     New complaints: Has been having back pain with sciatica- has been seeing ortho and is going to have an injection in February 02, 2019.  Social history: Lives alone since her husband died      Review of Systems  Constitutional: Negative for activity change and appetite change.  HENT: Negative.   Eyes: Negative for pain.  Respiratory: Negative for shortness of breath.   Cardiovascular: Positive for leg swelling (mild). Negative for chest pain and palpitations.  Gastrointestinal: Negative for abdominal pain.  Endocrine: Negative for polydipsia.  Genitourinary: Negative.   Musculoskeletal: Positive for arthralgias and back pain.  Skin: Negative for rash.  Neurological: Negative for dizziness, weakness and headaches.  Hematological: Does not bruise/bleed easily.  Psychiatric/Behavioral: Negative.   All other systems reviewed and are negative.      Objective:   Physical Exam Vitals signs and nursing note reviewed.  Constitutional:      General: She is not in acute distress.    Appearance: Normal appearance. She is well-developed.  HENT:     Head: Normocephalic.     Nose: Nose normal.  Eyes:     Pupils: Pupils are equal, round, and reactive to light.  Neck:     Musculoskeletal: Normal range of motion and neck supple.     Vascular: No carotid bruit or JVD.  Cardiovascular:     Rate and Rhythm: Normal rate. Rhythm irregular.     Heart sounds: Normal heart sounds.  Pulmonary:     Effort: Pulmonary effort is normal. No respiratory distress.     Breath sounds: Normal breath sounds. No wheezing or rales.  Chest:     Chest wall: No tenderness.  Abdominal:     General: Bowel sounds are normal. There is no distension or abdominal bruit.     Palpations: Abdomen is soft. There is no hepatomegaly, splenomegaly, mass or pulsatile mass.     Tenderness: There is no abdominal tenderness.  Musculoskeletal: Normal range of motion.     Right lower leg: Edema (1+)  present.     Left lower leg: Edema (1+) present.  Lymphadenopathy:     Cervical: No cervical adenopathy.  Skin:    General: Skin is warm and dry.  Neurological:     Mental Status: She is alert and oriented to person, place, and time.     Deep Tendon Reflexes: Reflexes are normal and symmetric.  Psychiatric:        Behavior: Behavior normal.        Thought Content: Thought content normal.        Judgment: Judgment normal.    BP (!) 164/69   Pulse (!) 51   Temp 98.3 F (36.8 C) (Oral)  Ht 5' 7" (1.702 m)   Wt 168 lb (76.2 kg)   BMI 26.31 kg/m   hgba1c 6.4%       Assessment & Plan:  Cass H Licht comes in today with chief complaint of Medical Management of Chronic Issues   Diagnosis and orders addressed:  1. Essential hypertension Low sodium diet - CMP14+EGFR  2. Chronic diastolic CHF (congestive heart failure) (HCC) Report any SOB  3. Hypothyroidism due to non-medication exogenous substances - Thyroid Panel With TSH  4. Hyperlipidemia, unspecified hyperlipidemia type Low fta diet - Lipid panel  5. Hypokalemia  6. Peripheral edema Elevate legs when sitting  7. Primary insomnia Bedtime routine  8. Diabetes mellitus type 2, diet-controlled (HCC) contineut owatch carbsin diet - Bayer DCA Hb A1c Waived  9. Gastroesophageal reflux disease without esophagitis Avoid spicy foods Do not eat 2 hours prior to bedtime  10. Persistent atrial fibrillation Keep follow up with cardiology  11. Other constipation Continue to increase fiber in diet  12. BMI 26.0-26.9,adult Discussed diet and exercise for person with BMI >25 Will recheck weight in 3-6 months  13. Acute right-sided low back pain with right-sided sciatica Keep appointment with ortho on  June 17 for back injection - predniSONE (DELTASONE) 20 MG tablet; 2 po at sametime daily for 5 days  Dispense: 10 tablet; Refill: 0   Labs pending Health Maintenance reviewed Diet and exercise encouraged   Follow up plan: 3 months   Mary-Margaret , FNP  

## 2019-01-25 LAB — THYROID PANEL WITH TSH
Free Thyroxine Index: 1.5 (ref 1.2–4.9)
T3 Uptake Ratio: 25 % (ref 24–39)
T4, Total: 5.9 ug/dL (ref 4.5–12.0)
TSH: 3.21 u[IU]/mL (ref 0.450–4.500)

## 2019-01-25 LAB — CMP14+EGFR
ALT: 10 IU/L (ref 0–32)
AST: 13 IU/L (ref 0–40)
Albumin/Globulin Ratio: 1.8 (ref 1.2–2.2)
Albumin: 3.8 g/dL (ref 3.6–4.6)
Alkaline Phosphatase: 70 IU/L (ref 39–117)
BUN/Creatinine Ratio: 16 (ref 12–28)
BUN: 18 mg/dL (ref 8–27)
Bilirubin Total: 0.4 mg/dL (ref 0.0–1.2)
CO2: 23 mmol/L (ref 20–29)
Calcium: 8.7 mg/dL (ref 8.7–10.3)
Chloride: 104 mmol/L (ref 96–106)
Creatinine, Ser: 1.15 mg/dL — ABNORMAL HIGH (ref 0.57–1.00)
GFR calc Af Amer: 50 mL/min/{1.73_m2} — ABNORMAL LOW (ref >59)
GFR calc non Af Amer: 43 mL/min/{1.73_m2} — ABNORMAL LOW (ref >59)
Globulin, Total: 2.1 g/dL (ref 1.5–4.5)
Glucose: 109 mg/dL — ABNORMAL HIGH (ref 65–99)
Potassium: 4.9 mmol/L (ref 3.5–5.2)
Sodium: 140 mmol/L (ref 134–144)
Total Protein: 5.9 g/dL — ABNORMAL LOW (ref 6.0–8.5)

## 2019-01-25 LAB — LIPID PANEL
Chol/HDL Ratio: 2.3 ratio (ref 0.0–4.4)
Cholesterol, Total: 151 mg/dL (ref 100–199)
HDL: 66 mg/dL (ref >39)
LDL Calculated: 63 mg/dL (ref 0–99)
Triglycerides: 110 mg/dL (ref 0–149)
VLDL Cholesterol Cal: 22 mg/dL (ref 5–40)

## 2019-01-26 ENCOUNTER — Telehealth: Payer: Self-pay

## 2019-01-27 ENCOUNTER — Telehealth (HOSPITAL_COMMUNITY): Payer: Self-pay | Admitting: *Deleted

## 2019-01-27 NOTE — Telephone Encounter (Signed)

## 2019-01-28 ENCOUNTER — Encounter (HOSPITAL_COMMUNITY): Payer: Self-pay | Admitting: Radiology

## 2019-01-28 ENCOUNTER — Other Ambulatory Visit: Payer: Self-pay

## 2019-01-28 ENCOUNTER — Ambulatory Visit (HOSPITAL_COMMUNITY): Payer: Medicare Other

## 2019-01-28 ENCOUNTER — Emergency Department (HOSPITAL_COMMUNITY): Payer: Medicare Other

## 2019-01-28 ENCOUNTER — Other Ambulatory Visit (INDEPENDENT_AMBULATORY_CARE_PROVIDER_SITE_OTHER): Payer: Medicare Other | Admitting: *Deleted

## 2019-01-28 ENCOUNTER — Encounter (HOSPITAL_COMMUNITY): Payer: Self-pay

## 2019-01-28 ENCOUNTER — Telehealth: Payer: Self-pay | Admitting: Cardiology

## 2019-01-28 ENCOUNTER — Emergency Department (HOSPITAL_COMMUNITY)
Admission: EM | Admit: 2019-01-28 | Discharge: 2019-01-28 | Disposition: A | Discharge status: Home / Self Care | Payer: Medicare Other | Attending: Emergency Medicine | Admitting: Emergency Medicine

## 2019-01-28 DIAGNOSIS — E1122 Type 2 diabetes mellitus with diabetic chronic kidney disease: Secondary | ICD-10-CM | POA: Diagnosis not present

## 2019-01-28 DIAGNOSIS — E039 Hypothyroidism, unspecified: Secondary | ICD-10-CM | POA: Insufficient documentation

## 2019-01-28 DIAGNOSIS — R002 Palpitations: Secondary | ICD-10-CM | POA: Diagnosis not present

## 2019-01-28 DIAGNOSIS — I480 Paroxysmal atrial fibrillation: Secondary | ICD-10-CM | POA: Diagnosis not present

## 2019-01-28 DIAGNOSIS — I5032 Chronic diastolic (congestive) heart failure: Secondary | ICD-10-CM | POA: Diagnosis not present

## 2019-01-28 DIAGNOSIS — I5031 Acute diastolic (congestive) heart failure: Secondary | ICD-10-CM | POA: Diagnosis not present

## 2019-01-28 DIAGNOSIS — Z79899 Other long term (current) drug therapy: Secondary | ICD-10-CM | POA: Insufficient documentation

## 2019-01-28 DIAGNOSIS — N183 Chronic kidney disease, stage 3 (moderate): Secondary | ICD-10-CM | POA: Insufficient documentation

## 2019-01-28 DIAGNOSIS — R0902 Hypoxemia: Secondary | ICD-10-CM | POA: Diagnosis not present

## 2019-01-28 DIAGNOSIS — I4891 Unspecified atrial fibrillation: Secondary | ICD-10-CM | POA: Diagnosis not present

## 2019-01-28 DIAGNOSIS — R0602 Shortness of breath: Secondary | ICD-10-CM | POA: Diagnosis not present

## 2019-01-28 DIAGNOSIS — I483 Typical atrial flutter: Secondary | ICD-10-CM | POA: Diagnosis not present

## 2019-01-28 DIAGNOSIS — Z03818 Encounter for observation for suspected exposure to other biological agents ruled out: Secondary | ICD-10-CM | POA: Diagnosis not present

## 2019-01-28 DIAGNOSIS — I4819 Other persistent atrial fibrillation: Secondary | ICD-10-CM | POA: Diagnosis not present

## 2019-01-28 DIAGNOSIS — Z7901 Long term (current) use of anticoagulants: Secondary | ICD-10-CM | POA: Diagnosis not present

## 2019-01-28 DIAGNOSIS — I4892 Unspecified atrial flutter: Secondary | ICD-10-CM | POA: Diagnosis not present

## 2019-01-28 DIAGNOSIS — I130 Hypertensive heart and chronic kidney disease with heart failure and stage 1 through stage 4 chronic kidney disease, or unspecified chronic kidney disease: Secondary | ICD-10-CM | POA: Diagnosis not present

## 2019-01-28 DIAGNOSIS — R009 Unspecified abnormalities of heart beat: Secondary | ICD-10-CM | POA: Diagnosis present

## 2019-01-28 DIAGNOSIS — I499 Cardiac arrhythmia, unspecified: Secondary | ICD-10-CM | POA: Diagnosis not present

## 2019-01-28 DIAGNOSIS — R000 Tachycardia, unspecified: Secondary | ICD-10-CM | POA: Diagnosis not present

## 2019-01-28 LAB — COMPREHENSIVE METABOLIC PANEL
ALT: 15 U/L (ref 0–44)
AST: 17 U/L (ref 15–41)
Albumin: 3.8 g/dL (ref 3.5–5.0)
Alkaline Phosphatase: 56 U/L (ref 38–126)
Anion gap: 14 (ref 5–15)
BUN: 33 mg/dL — ABNORMAL HIGH (ref 8–23)
CO2: 24 mmol/L (ref 22–32)
Calcium: 9.5 mg/dL (ref 8.9–10.3)
Chloride: 101 mmol/L (ref 98–111)
Creatinine, Ser: 1.6 mg/dL — ABNORMAL HIGH (ref 0.44–1.00)
GFR calc Af Amer: 34 mL/min — ABNORMAL LOW (ref >60)
GFR calc non Af Amer: 29 mL/min — ABNORMAL LOW (ref >60)
Glucose, Bld: 144 mg/dL — ABNORMAL HIGH (ref 70–99)
Potassium: 4 mmol/L (ref 3.5–5.1)
Sodium: 139 mmol/L (ref 135–145)
Total Bilirubin: 0.6 mg/dL (ref 0.3–1.2)
Total Protein: 6.8 g/dL (ref 6.5–8.1)

## 2019-01-28 LAB — CBC WITH DIFFERENTIAL/PLATELET
Abs Immature Granulocytes: 0.1 10*3/uL — ABNORMAL HIGH (ref 0.00–0.07)
Basophils Absolute: 0 10*3/uL (ref 0.0–0.1)
Basophils Relative: 0 %
Eosinophils Absolute: 0 10*3/uL (ref 0.0–0.5)
Eosinophils Relative: 0 %
HCT: 40.3 % (ref 36.0–46.0)
Hemoglobin: 13.5 g/dL (ref 12.0–15.0)
Immature Granulocytes: 1 %
Lymphocytes Relative: 23 %
Lymphs Abs: 2.8 10*3/uL (ref 0.7–4.0)
MCH: 34.5 pg — ABNORMAL HIGH (ref 26.0–34.0)
MCHC: 33.5 g/dL (ref 30.0–36.0)
MCV: 103.1 fL — ABNORMAL HIGH (ref 80.0–100.0)
Monocytes Absolute: 0.7 10*3/uL (ref 0.1–1.0)
Monocytes Relative: 6 %
Neutro Abs: 8.6 10*3/uL — ABNORMAL HIGH (ref 1.7–7.7)
Neutrophils Relative %: 70 %
Platelets: 232 10*3/uL (ref 150–400)
RBC: 3.91 MIL/uL (ref 3.87–5.11)
RDW: 12.8 % (ref 11.5–15.5)
WBC: 12.3 10*3/uL — ABNORMAL HIGH (ref 4.0–10.5)
nRBC: 0 % (ref 0.0–0.2)

## 2019-01-28 LAB — TROPONIN I: Troponin I: 0.09 ng/mL (ref <0.03)

## 2019-01-28 LAB — TSH: TSH: 2.659 u[IU]/mL (ref 0.350–4.500)

## 2019-01-28 LAB — MAGNESIUM: Magnesium: 2 mg/dL (ref 1.7–2.4)

## 2019-01-28 LAB — BRAIN NATRIURETIC PEPTIDE: B Natriuretic Peptide: 627 pg/mL — ABNORMAL HIGH (ref 0.0–100.0)

## 2019-01-28 MED ORDER — DILTIAZEM HCL 25 MG/5ML IV SOLN
20.0000 mg | Freq: Once | INTRAVENOUS | Status: AC
  Administered 2019-01-28: 20 mg via INTRAVENOUS
  Filled 2019-01-28: qty 5

## 2019-01-28 MED ORDER — FENTANYL CITRATE (PF) 100 MCG/2ML IJ SOLN
100.0000 ug | Freq: Once | INTRAMUSCULAR | Status: DC
  Filled 2019-01-28: qty 2

## 2019-01-28 MED ORDER — METOPROLOL TARTRATE 5 MG/5ML IV SOLN
5.0000 mg | Freq: Once | INTRAVENOUS | Status: AC
  Administered 2019-01-28: 5 mg via INTRAVENOUS
  Filled 2019-01-28: qty 5

## 2019-01-28 MED ORDER — MIDAZOLAM HCL 2 MG/2ML IJ SOLN
INTRAMUSCULAR | Status: AC | PRN
  Administered 2019-01-28: 4 mg via INTRAVENOUS

## 2019-01-28 MED ORDER — DILTIAZEM HCL-DEXTROSE 100-5 MG/100ML-% IV SOLN (PREMIX)
5.0000 mg/h | INTRAVENOUS | Status: DC
  Filled 2019-01-28: qty 100

## 2019-01-28 MED ORDER — FLUMAZENIL 0.5 MG/5ML IV SOLN
INTRAVENOUS | Status: AC | PRN
  Administered 2019-01-28: .25 mg via INTRAVENOUS

## 2019-01-28 MED ORDER — ASPIRIN 81 MG PO CHEW: 324.0000 mg | CHEWABLE_TABLET | Freq: Once | ORAL | Status: DC

## 2019-01-28 MED ORDER — FLUMAZENIL 0.5 MG/5ML IV SOLN
INTRAVENOUS | Status: AC
  Filled 2019-01-28: qty 5

## 2019-01-28 MED ORDER — FENTANYL CITRATE (PF) 100 MCG/2ML IJ SOLN
INTRAMUSCULAR | Status: AC | PRN
  Administered 2019-01-28: 100 ug via INTRAVENOUS

## 2019-01-28 MED ORDER — MIDAZOLAM HCL 2 MG/2ML IJ SOLN
4.0000 mg | Freq: Once | INTRAMUSCULAR | Status: DC
  Filled 2019-01-28: qty 4

## 2019-01-28 NOTE — ED Notes (Signed)
Pt heart rate dropped to 38,  EKG repeated and MD notified.  Holding Cardizem drip at this time.

## 2019-01-28 NOTE — Telephone Encounter (Signed)
Left message regarding appt on 01/31/19. 

## 2019-01-28 NOTE — ED Provider Notes (Signed)
[facility] EMERGENCY DEPARTMENT Provider Note   CSN: [id number] Arrival date & time: 01/28/19  1007     History   Chief Complaint Chief Complaint  Patient presents with  . Irregular Heart Beat    HPI Katie [name] is a 85 y.o. female.     HPI  The pt is an 85 y/o female - very pleasant - hard of hearing - hx of possible arrhythmia, it is unclear whether this is tachybradycardia syndrome or some other type of intermittent palpitations such as atrial fibrillation or flutter.  She did have a history of atrial fibrillation in May 2017, she states that on Monday night she felt acute onset of palpitations in her chest.  She does take Xarelto, she is also recently been treated with a couple of courses of steroids for some back pain.  She has not had any relief from her palpitation since Monday evening.  She was scheduled for an echocardiogram today but when she arrived and was found to have a heart rate in the 130s she was redirected to the emergency department.  The patient denies fever, chills, shortness of breath, swelling of the legs, abdominal pain back pain or chest pain.  She does feel diffuse weakness but also reports that she has felt diffuse weakness ever since her husband passed away in 2022/11/09.  She has been losing weight.  She reports that she is compliant with her medications including her thyroid medications  Past Medical History:  Diagnosis Date  . Aortic insufficiency    a. Trivial AI by echo 02/2016.  [name] Atrial fibrillation and flutter ([place])    a. Coarse afib vs flutter by EKG 12/2015.  . Cataract   . Chronic diastolic CHF (congestive heart failure) ([place])   . CKD (chronic kidney disease), stage III ([place])   . Edema   . GERD (gastroesophageal reflux disease)   . Hiatal hernia   . Hypercholesterolemia   . Hypertension   . Hypokalemia   . Hypothyroidism   . Left knee pain   . NIDDM (non-insulin dependent diabetes mellitus)    diet controlled   .  Obesity   . Premature atrial contractions   . PVC's (premature ventricular contractions)   . URI (upper respiratory infection)   . Vitamin D deficiency     Patient Active Problem List   Diagnosis Date Noted  . Back pain 01/24/2019  . Chronic midline low back pain without sciatica 10/22/2018  . Primary insomnia 10/22/2018  . Persistent atrial fibrillation 04/27/2017  . Chronic diastolic CHF (congestive heart failure) ([place]) 04/27/2017  . CKD (chronic kidney disease) stage 3, GFR 30-59 ml/min (HCC) 05/14/2015  . BMI 26.0-26.9,adult 05/11/2015  . Peripheral edema 07/06/2014  . Hypokalemia 07/22/2013  . Hypothyroidism 07/22/2013  . Osteopenia, senile 03/09/2013  . Constipation 01/04/2013  . Hypertension 05/07/2012  . Hyperlipidemia 05/07/2012  . Diabetes mellitus type 2, diet-controlled ([place]) 05/07/2012  . GERD (gastroesophageal reflux disease) 05/07/2012    Past Surgical History:  Procedure Laterality Date  . ABDOMINAL HYSTERECTOMY    . APPENDECTOMY  1980  . BACK SURGERY    . CATARACT EXTRACTION, BILATERAL    . CHOLECYSTECTOMY  5/00  . COLONOSCOPY    . TONSILLECTOMY    . TOTAL ABDOMINAL HYSTERECTOMY W/ BILATERAL SALPINGOOPHORECTOMY  1980  . UPPER GASTROINTESTINAL ENDOSCOPY       OB History   No obstetric history on file.      Home Medications    Prior to Admission  medications   Medication Sig Start Date End Date Taking? Authorizing Provider  benazepril (LOTENSIN) 40 MG tablet TAKE 1 & 1/2 (ONE & ONE-HALF) TABLETS BY MOUTH ONCE DAILY 10/22/18   Hassell Done, Mary-Margaret, FNP  clonazePAM (KLONOPIN) 0.5 MG tablet Take 1 tablet (0.5 mg total) by mouth 2 (two) times daily as needed for anxiety. 01/19/19   Hassell Done, Mary-Margaret, FNP  etodolac (LODINE) 400 MG tablet Take 400 mg by mouth daily.    [provider]  furosemide (LASIX) 40 MG tablet TAKE ONE TABLET BY MOUTH IN THE MORNING DAILY. TAKE 1/2 TABLET BY MOUTH IN THE  AFTERNOON DAILY. 10/22/18   Hassell Done, Mary-Margaret,  FNP  levothyroxine (SYNTHROID, LEVOTHROID) 50 MCG tablet TAKE 1 TABLET BY MOUTH  DAILY BEFORE BREAKFAST 10/22/18   Hassell Done, Mary-Margaret, FNP  lovastatin (MEVACOR) 40 MG tablet Take 2 tablets (80 mg total) by mouth at bedtime. 10/22/18   Hassell Done, Mary-Margaret, FNP  metoprolol tartrate (LOPRESSOR) 25 MG tablet Take 1 tablet (25 mg total) by mouth 2 (two) times daily. 10/22/18   Hassell Done, Mary-Margaret, FNP  omeprazole (PRILOSEC) 40 MG capsule Take 1 capsule (40 mg total) by mouth daily. 10/22/18   Hassell Done, Mary-Margaret, FNP  potassium chloride SA (K-DUR,KLOR-CON) 20 MEQ tablet Take 1 tablet (20 mEq total) by mouth 2 (two) times daily. 10/22/18   Hassell Done, Mary-Margaret, FNP  predniSONE (DELTASONE) 20 MG tablet 2 po at sametime daily for 5 days 01/24/19   [name], FNP  raloxifene (EVISTA) 60 MG tablet Take 1 tablet (60 mg total) by mouth daily. 10/22/18   Hassell Done Mary-Margaret, FNP  Rivaroxaban (XARELTO) 15 MG TABS tablet TAKE 1 TABLET BY MOUTH ONCE DAILY WITH SUPPER 10/22/18   Hassell Done, Mary-Margaret, FNP  traMADol (ULTRAM) 50 MG tablet TAKE 1 TABLET BY MOUTH TWICE DAILY AS NEEDED FOR MODERATE PAIN OR SEVERE PAIN 10/22/18   [name], FNP    Family History Family History  Problem Relation Age of Onset  . Diabetes Mother   . Stroke Mother   . Heart disease Father   . Stroke Father   . Uterine cancer Sister   . Diabetes Sister   . Ovarian cancer Sister   . Colon cancer Sister   . Diabetes Sister   . Liver cancer Sister        \  . Diabetes Brother   . Dementia Brother   . Atrial fibrillation Sister   . Diabetes Sister   . Diabetes Son   . Heart attack Neg Hx   . Hypertension Neg Hx     Social History Social History   Tobacco Use  . Smoking status: Never Smoker  . Smokeless tobacco: Never Used  Substance Use Topics  . Alcohol use: No  . Drug use: No     Allergies   Amlodipine, Macrodantin, Metformin and related, and Penicillins   Review of Systems Review of Systems   All other systems reviewed and are negative.    Physical Exam Updated Vital Signs Temp 98.3 F (36.8 C) (Oral)   Ht 1.702 m (5\' 7" )   Wt 76.2 kg   BMI 26.31 kg/m   Physical Exam Vitals signs and nursing note reviewed.  Constitutional:      General: She is not in acute distress.    Appearance: She is well-developed.  HENT:     Head: Normocephalic and atraumatic.     Mouth/Throat:     Pharynx: No oropharyngeal exudate.  Eyes:     General: No scleral icterus.  Right eye: No discharge.        Left eye: No discharge.     Conjunctiva/sclera: Conjunctivae normal.     Pupils: Pupils are equal, round, and reactive to light.  Neck:     Musculoskeletal: Normal range of motion and neck supple.     Thyroid: No thyromegaly.     Vascular: No JVD.  Cardiovascular:     Rate and Rhythm: Regular rhythm. Tachycardia present.     Heart sounds: Normal heart sounds. No murmur. No friction rub. No gallop.   Pulmonary:     Effort: Pulmonary effort is normal. No respiratory distress.     Breath sounds: Normal breath sounds. No wheezing or rales.  Abdominal:     General: Bowel sounds are normal. There is no distension.     Palpations: Abdomen is soft. There is no mass.     Tenderness: There is no abdominal tenderness.  Musculoskeletal: Normal range of motion.        General: No tenderness.  Lymphadenopathy:     Cervical: No cervical adenopathy.  Skin:    General: Skin is warm and dry.     Findings: No erythema or rash.  Neurological:     Mental Status: She is alert.     Coordination: Coordination normal.  Psychiatric:        Behavior: Behavior normal.      ED Treatments / Results  Labs (all labs ordered are listed, but only abnormal results are displayed) Labs Reviewed  CBC WITH DIFFERENTIAL/PLATELET  TSH  COMPREHENSIVE METABOLIC PANEL  TROPONIN I  BRAIN NATRIURETIC PEPTIDE  MAGNESIUM    EKG EKG Interpretation  Date/Time:  Friday January 28 2019 10:10:09 EDT  Ventricular Rate:  138 PR Interval:    QRS Duration: 86 QT Interval:  259 QTC Calculation: 393 R Axis:   13 Text Interpretation:  Sinus tachycardia Repolarization abnormality, prob rate related Since last tracing rate faster Confirmed by Noemi Chapel [phone]) on 01/28/2019 10:21:24 AM   EKG Interpretation  Date/Time:  Friday January 28 2019 10:10:09 EDT Ventricular Rate:  138 PR Interval:    QRS Duration: 86 QT Interval:  259 QTC Calculation: 393 R Axis:   13 Text Interpretation:  Sinus tachycardia Repolarization abnormality, prob rate related Since last tracing rate faster Confirmed by Noemi Chapel [phone]) on 01/28/2019 10:21:24 AM       EKG Interpretation  Date/Time:  Friday January 28 2019 12:04:57 EDT Ventricular Rate:  39 PR Interval:    QRS Duration: 104 QT Interval:  462 QTC Calculation: 372 R Axis:   10 Text Interpretation:  Atrial flutter LVH with secondary repolarization abnormality Since last tracing rate slower Confirmed by Noemi Chapel [phone]) on 01/28/2019 12:39:40 PM         Radiology Dg Chest 2 View  Result Date: 01/28/2019 CLINICAL DATA:  Shortness of breath and palpitations. EXAM: CHEST - 2 VIEW COMPARISON:  06/10/2016 FINDINGS: The cardiomediastinal silhouette is unchanged with normal heart size. Thoracic aortic atherosclerosis and tortuosity are noted. The lungs are well inflated without evidence of airspace consolidation, edema, pleural effusion, or pneumothorax. No acute osseous abnormality is seen. IMPRESSION: No active cardiopulmonary disease. Electronically Signed   By: Logan Bores M.D.   On: 01/28/2019 11:04    Procedures Procedures (including critical care time)  Medications Ordered in ED Medications  aspirin chewable tablet 324 mg (0 mg Oral Hold 01/28/19 1456)  diltiazem (CARDIZEM) 100 mg in dextrose 5% 158mL (1 mg/mL) infusion (0 mg/hr Intravenous Hold 01/28/19  1211)  midazolam (VERSED) injection 4 mg (has no administration in time range)   fentaNYL (SUBLIMAZE) injection 100 mcg (has no administration in time range)  metoprolol tartrate (LOPRESSOR) injection 5 mg (5 mg Intravenous Given 01/28/19 1117)  diltiazem (CARDIZEM) injection 20 mg (20 mg Intravenous Given 01/28/19 1143)     Initial Impression / Assessment and Plan / ED Course  I have reviewed the triage vital signs and the nursing notes.  Pertinent labs & imaging results that were available during my care of the patient were reviewed by me and considered in my medical decision making (see chart for details).  Clinical Course as of Jan 27 1214  Fri Jan 28, 2019  1120 I personally looked at and interpreted the chest x-ray, there does not appear to be any significant cardiomegaly, there is no abnormal pulmonary findings and the skin and soft tissues appear normal.  My impression is that that this is a normal chest x-ray   [BM]  1144 Troponin is elevated not surprisingly given the patient's persistent tachycardia.  There also appears to be a slight elevation in the BNP as well.  I discussed the case with the cardiology service with Wannetta Sender, she will coordinate for cardiology evaluation.   [BM]  1158 Cardizem drip started   [BM]    Clinical Course User Index [BM] Noemi Chapel, MD       The pt is tachycardic, but has no other acute findings on her exam of concern - she has mild depression - but may be tachycardic related to arrhythmia / prednisone / steroid use or less likely infection - no fever, and is anticoagulated making PE less likely.  Will get labs and d/w Cardiology.  Likely needs admission - will give dose of BB.  Discussed with the cardiology team, they request that the patient be cardioverted.  Change of shift, care signed out to Dr. Darl Householder who will perform the cardioversion.  I have discussed this with the patient and she is agreeable to the risk benefits and alternatives of the procedure.  Anticipate that the patient will be able to be discharged if successfully  cardioverted.  She has been on Xarelto for more than 1 month  Final Clinical Impressions(s) / ED Diagnoses   Final diagnoses:  Atrial flutter with rapid ventricular response ([place])  Atrial fibrillation with tachycardic ventricular rate (HCC)      Noemi Chapel, MD 01/28/19 [phone]

## 2019-01-28 NOTE — ED Triage Notes (Signed)
Pt was brought from [facility], she had an echo this morning, patient fatigued for 3 days

## 2019-01-28 NOTE — Progress Notes (Signed)
Patient ID: Katie [name], female   DOB: January 27, 1932, 85 y.o.   MRN: [id number]  Echocardiogram canceled due heart per Dr. Radford Pax, DOD. Patient being discharged to hospital. EKG in office performed.

## 2019-01-28 NOTE — ED Notes (Signed)
Pt.'s family updated.

## 2019-01-28 NOTE — Progress Notes (Signed)
[facility]

## 2019-01-28 NOTE — ED Provider Notes (Signed)
Physical Exam  BP (!) 116/59   Pulse (!) 42   Temp 98.3 F (36.8 C) (Oral)   Resp 13   Ht 5\' 7"  (1.702 m)   Wt 76.2 kg   SpO2 99%   BMI 26.31 kg/m   Physical Exam  ED Course/Procedures   Clinical Course as of Jan 27 1809  Fri Jan 28, 2019  1120 I personally looked at and interpreted the chest x-ray, there does not appear to be any significant cardiomegaly, there is no abnormal pulmonary findings and the skin and soft tissues appear normal.  My impression is that that this is a normal chest x-ray   [BM]  1144 Troponin is elevated not surprisingly given the patient's persistent tachycardia.  There also appears to be a slight elevation in the BNP as well.  I discussed the case with the cardiology service with Wannetta Sender, she will coordinate for cardiology evaluation.   [BM]  1158 Cardizem drip started   [BM]    Clinical Course User Index [BM] Noemi Chapel, MD    .Cardioversion  Date/Time: 01/28/2019 6:11 PM Performed by: Drenda Freeze, MD Authorized by: Drenda Freeze, MD   Consent:    Consent obtained:  Verbal and written   Consent given by:  Patient   Risks discussed:  Induced arrhythmia and cutaneous burn   Alternatives discussed:  No treatment Pre-procedure details:    Cardioversion basis:  Emergent   Rhythm:  Atrial flutter Patient sedated: Yes. Refer to sedation procedure documentation for details of sedation.  Attempt one:    Cardioversion mode:  Synchronous   Waveform:  Monophasic   Shock (Joules):  150   Shock outcome:  Conversion to normal sinus rhythm Post-procedure details:    Patient status:  Awake   Patient tolerance of procedure:  Tolerated well, no immediate complications .Sedation  Date/Time: 01/28/2019 6:11 PM Performed by: Drenda Freeze, MD Authorized by: Drenda Freeze, MD   Consent:    Consent obtained:  Written (electronic informed consent)   Risks discussed:  Allergic reaction, dysrhythmia, inadequate sedation, nausea,  vomiting, respiratory compromise necessitating ventilatory assistance and intubation, prolonged sedation necessitating reversal and prolonged hypoxia resulting in organ damage Universal protocol:    Procedure explained and questions answered to patient or proxy's satisfaction: yes     Relevant documents present and verified: yes     Test results available and properly labeled: yes     Imaging studies available: yes     Required blood products, implants, devices, and special equipment available: yes     Immediately prior to procedure a time out was called: yes     Patient identity confirmation method:  Arm band Pre-sedation assessment:    Time since last food or drink:  5 hours   ASA classification: class 1 - normal, healthy patient     Mallampati score:  I - soft palate, uvula, fauces, pillars visible   Pre-sedation assessments completed and reviewed: airway patency   Immediate pre-procedure details:    Reassessment: Patient reassessed immediately prior to procedure     Reviewed: vital signs, relevant labs/tests and NPO status     Verified: bag valve mask available, emergency equipment available, intubation equipment available, IV patency confirmed, oxygen available, reversal medications available and suction available   Procedure details (see MAR for exact dosages):    Intra-procedure monitoring:  Blood pressure monitoring, continuous pulse oximetry, cardiac monitor, frequent vital sign checks and frequent LOC assessments   Total Provider sedation time (minutes):  30 Post-procedure details:    Attendance: Constant attendance by certified staff until patient recovered     Recovery: Patient returned to pre-procedure baseline     Post-sedation assessments completed and reviewed: airway patency, cardiovascular function, hydration status, mental status and respiratory function     Patient is stable for discharge or admission: yes     Patient tolerance:  Tolerated well, no immediate  complications    MDM  Care assumed at 3 pm. Patient here with rapid aflutter. Cardizem started then she became bradycardic to 30s. Cardiology recommend cardioversion. Patient is anticoagulated already. Dr. Sabra Heck and I discussed risks and benefits of cardioversion and patient agreed to it.   5 pm Cardioversion and conscious sedation performed by me. Cardiology will come by and see patient and set up follow up.   6:13 PM Patient awake and alert. She will follow up with Dr. Rayann Heman on Monday. Will increase lasix to 40 mg BID and will continue anticoagulation and metoprolol      Drenda Freeze, MD 01/28/19 1816

## 2019-01-28 NOTE — Discharge Instructions (Addendum)
Increase lasix to 40 mg twice daily.   Continue metoprolol.  Follow up with Dr. Rayann Heman   Return to ER if you have worse palpitations, chest pain, shortness of breath

## 2019-01-28 NOTE — Progress Notes (Unsigned)
Patient heart note was fast. DOD, Dr. Radford Pax notified. EKG performed. Patient being discharged to hospital per Dr. Radford Pax. Echocardiogram not performed.

## 2019-01-28 NOTE — Sedation Documentation (Addendum)
Pt being bagged by respiratory therapy. Pulling romazicon for versed reversal.

## 2019-01-28 NOTE — Telephone Encounter (Signed)
Patient here for 2D echo today. She states that she has been having palpitations since Monday.  Has a hx of tachybrady with PAF.  EKG today shows atrial tach vs. Atypical atrial flutter with RVR 130-140bpm.  Patient will be sent to [facility] ER for further treatment and EP eval.

## 2019-01-28 NOTE — ED Notes (Signed)
Pt was discharged by [facility] RN. I helped pt ambulated in the restroom, she ambulated well. Pt dressed herself. Pt placed in car of her family and left.

## 2019-01-28 NOTE — H&P (Addendum)
Cardiology Admission History and Physical:   Patient ID: Katie [name] MRN: [id number]; DOB: 07-22-1932   Admission date: 01/28/2019  Primary Care Provider: [name], [place] Primary Cardiologist: Lauree Chandler, MD  Primary Electrophysiologist:  None   Chief Complaint:  Tachycardia  Patient Profile:   Katie [name] is a 85 y.o. female with a past medical history significant for hypertension, hyperlipidemia, DM type 2, hypothyroidism and atrial fibrillation with CHADSVASC=3 on Xarelto, trivial AI, chronic diastolic CHF, CKD stage III. Last 2-D echo July 2671 normal LV systolic function.   History of Present Illness:   [name] has medical history as above. She was last seen in the office by Cecilie Kicks, NP on 01/07/2019 at which time she reported some episodes of fast heart rates, 2-3 times per month. She has a history of afib and some SVT. She is on anticoagulation. At that visit her HR was 44 bpm and she felt fatigued. She had recently lost her husband in February and was felt to be depressed. Mickel Baas felt that she may have tachy-brady syndrome and arranged for her to be seen by EP with appt on 6/15 with Dr. Rayann Heman. Plan for possible 30 day monitor vs Loop insertion. She was euvolemic from a heart failure standpoint.   Today [name] tells me that she has been having intermittent fast heart beats for about a month. She had a few episodes of nearly passing out but no syncope. None since prior to her office visit on 5/22. She has been just laying around, keeping quiet. On Monday night she was awakened with a "pounding" fast heart beat. She has felt like her heart has been continuously fast since then. She can occ get it to stop by laying on her side. She has a mild "pulling" in her left lateral chest area that his fairly mild and constant. She denies shortness of breath. She has chronic ankle edema at the end of the day that is improved by morning. No recent increase in edema and  no orthopnea. She has occ lightheadedness while up walking around. She has been taking her metoprolol as directed.    Past Medical History:  Diagnosis Date   Aortic insufficiency    a. Trivial AI by echo 02/2016.   Atrial fibrillation and flutter ([place])    a. Coarse afib vs flutter by EKG 12/2015.   Cataract    Chronic diastolic CHF (congestive heart failure) (HCC)    CKD (chronic kidney disease), stage III (HCC)    Edema    GERD (gastroesophageal reflux disease)    Hiatal hernia    Hypercholesterolemia    Hypertension    Hypokalemia    Hypothyroidism    Left knee pain    NIDDM (non-insulin dependent diabetes mellitus)    diet controlled    Obesity    Premature atrial contractions    PVC's (premature ventricular contractions)    URI (upper respiratory infection)    Vitamin D deficiency     Past Surgical History:  Procedure Laterality Date   ABDOMINAL HYSTERECTOMY     APPENDECTOMY  1980   BACK SURGERY     CATARACT EXTRACTION, BILATERAL     CHOLECYSTECTOMY  5/00   COLONOSCOPY     TONSILLECTOMY     TOTAL ABDOMINAL HYSTERECTOMY W/ BILATERAL SALPINGOOPHORECTOMY  1980   UPPER GASTROINTESTINAL ENDOSCOPY       Medications Prior to Admission: Prior to Admission medications   Medication Sig Start Date End Date Taking? Authorizing Provider  benazepril (LOTENSIN) 40 MG tablet TAKE 1 & 1/2 (ONE & ONE-HALF) TABLETS BY MOUTH ONCE DAILY Patient taking differently: Take 60 mg by mouth daily. TAKE 1 & 1/2 (ONE & ONE-HALF) TABLETS BY MOUTH ONCE DAILY 10/22/18  Yes Hassell Done, Mary-Margaret, FNP  clonazePAM (KLONOPIN) 0.5 MG tablet Take 1 tablet (0.5 mg total) by mouth 2 (two) times daily as needed for anxiety. 01/19/19  Yes Martin, Mary-Margaret, FNP  etodolac (LODINE) 400 MG tablet Take 400 mg by mouth daily.   Yes [provider]  ferrous sulfate 325 (65 FE) MG tablet Take 325 mg by mouth daily with breakfast.   Yes [provider]  furosemide  (LASIX) 40 MG tablet TAKE ONE TABLET BY MOUTH IN THE MORNING DAILY. TAKE 1/2 TABLET BY MOUTH IN THE  AFTERNOON DAILY. Patient taking differently: Take 20-40 mg by mouth See admin instructions. TAKE ONE TABLET BY MOUTH IN THE MORNING DAILY. TAKE 1/2 TABLET BY MOUTH IN THE  AFTERNOON DAILY. 10/22/18  Yes Martin, Mary-Margaret, FNP  levothyroxine (SYNTHROID, LEVOTHROID) 50 MCG tablet TAKE 1 TABLET BY MOUTH  DAILY BEFORE BREAKFAST Patient taking differently: Take 50 mcg by mouth daily before breakfast. TAKE 1 TABLET BY MOUTH  DAILY BEFORE BREAKFAST 10/22/18  Yes Hassell Done, Mary-Margaret, FNP  loratadine (CLARITIN) 10 MG tablet Take 10 mg by mouth daily as needed for allergies.   Yes [provider]  lovastatin (MEVACOR) 40 MG tablet Take 2 tablets (80 mg total) by mouth at bedtime. 10/22/18  Yes Hassell Done, Mary-Margaret, FNP  metoprolol tartrate (LOPRESSOR) 25 MG tablet Take 1 tablet (25 mg total) by mouth 2 (two) times daily. 10/22/18  Yes Martin, Mary-Margaret, FNP  omeprazole (PRILOSEC) 40 MG capsule Take 1 capsule (40 mg total) by mouth daily. 10/22/18  Yes Martin, Mary-Margaret, FNP  potassium chloride SA (K-DUR,KLOR-CON) 20 MEQ tablet Take 1 tablet (20 mEq total) by mouth 2 (two) times daily. 10/22/18  Yes Hassell Done, Mary-Margaret, FNP  predniSONE (DELTASONE) 20 MG tablet 2 po at sametime daily for 5 days 01/24/19  Yes Hassell Done, Mary-Margaret, FNP  Propylene Glycol (SYSTANE BALANCE) 0.6 % SOLN Place 1 drop into both eyes daily as needed (dry eyes).   Yes [provider]  raloxifene (EVISTA) 60 MG tablet Take 1 tablet (60 mg total) by mouth daily. 10/22/18  Yes Hassell Done, Mary-Margaret, FNP  Rivaroxaban (XARELTO) 15 MG TABS tablet TAKE 1 TABLET BY MOUTH ONCE DAILY WITH SUPPER Patient taking differently: Take 15 mg by mouth daily with supper. TAKE 1 TABLET BY MOUTH ONCE DAILY WITH SUPPER 10/22/18  Yes Hassell Done, Mary-Margaret, FNP  traMADol (ULTRAM) 50 MG tablet TAKE 1 TABLET BY MOUTH TWICE DAILY AS NEEDED FOR MODERATE  PAIN OR SEVERE PAIN Patient taking differently: Take 50 mg by mouth 2 (two) times daily as needed for moderate pain. TAKE 1 TABLET BY MOUTH TWICE DAILY AS NEEDED FOR MODERATE PAIN OR SEVERE PAIN 10/22/18  Yes Hassell Done Mary-Margaret, FNP     Allergies:    Allergies  Allergen Reactions   Amlodipine Swelling   Macrodantin Nausea And Vomiting   Metformin And Related Nausea And Vomiting and Other (See Comments)    Bloating   Penicillins Rash    Social History:   Social History   Socioeconomic History   Marital status: Married    Spouse name: Not on file   Number of children: 2   Years of education: Not on file   Highest education level: High school graduate  Occupational History   Occupation: Retired  Scientific laboratory technician  Financial resource strain: Somewhat hard   Food insecurity    Worry: Never true    Inability: Never true   Transportation needs    Medical: No    Non-medical: No  Tobacco Use   Smoking status: Never Smoker   Smokeless tobacco: Never Used  Substance and Sexual Activity   Alcohol use: No   Drug use: No   Sexual activity: Not Currently  Lifestyle   Physical activity    Days per week: 0 days    Minutes per session: 0 min   Stress: Not at all  Relationships   Social connections    Talks on phone: More than three times a week    Gets together: More than three times a week    Attends religious service: Never    Active member of club or organization: No    Attends meetings of clubs or organizations: Never    Relationship status: Married   Intimate partner violence    Fear of current or ex partner: No    Emotionally abused: Not on file    Physically abused: No    Forced sexual activity: No  Other Topics Concern   Not on file  Social History Narrative   Not on file    Family History:   The patient's family history includes Atrial fibrillation in her sister; Colon cancer in her sister; Dementia in her brother; Diabetes in her brother,  mother, sister, sister, sister, and son; Heart disease in her father; Liver cancer in her sister; Ovarian cancer in her sister; Stroke in her father and mother; Uterine cancer in her sister. There is no history of Heart attack or Hypertension.    ROS:  Please see the history of present illness.  All other ROS reviewed and negative.     Physical Exam/Data:   Vitals:   01/28/19 1130 01/28/19 1142 01/28/19 1145 01/28/19 1200  BP: (!) 141/94  127/80 (!) 115/56  Pulse: (!) 138 (!) 136 (!) 136 (!) 39  Resp: 16 (!) 23 13 14   Temp:      TempSrc:      SpO2: 98% 98% 96% 97%  Weight:      Height:       No intake or output data in the 24 hours ending 01/28/19 1310 Last 3 Weights 01/28/2019 01/24/2019 01/07/2019  Weight (lbs) 167 lb 15.9 oz 168 lb 164 lb 1.9 oz  Weight (kg) 76.2 kg 76.204 kg 74.444 kg     Body mass index is 26.31 kg/m.  General:  Well nourished, well developed, in no acute distress HEENT: normal Lymph: no adenopathy Neck: no JVD Endocrine:  No thryomegaly Vascular: No carotid bruits; FA pulses 2+ bilaterally without bruits  Cardiac:  normal S1, S2; bradycardic, irregularly irregular rhythm; no murmur  Lungs:  clear to auscultation bilaterally, no wheezing, rhonchi or rales  Abd: soft, nontender, no hepatomegaly  Ext: puffy edema edema Musculoskeletal:  No deformities, BUE and BLE strength normal and equal Skin: warm and dry  Neuro:  CNs 2-12 intact, hard of hearing Psych:  Normal affect    EKG:  The ECG that was done today was personally reviewed and demonstrates ?atrial tachycardia at 138 bpm follow by another EKG demonstarting atrial flutter at 66 bmp with LVH with secondary repolarization abnormality  Relevant CV Studies:  Echocardiogram 03/10/16 Study Conclusions  - Left ventricle: The cavity size was normal. Systolic function was   normal. The estimated ejection fraction was in the range of 60%  to 65%. Doppler parameters are consistent with abnormal left    ventricular relaxation (grade 1 diastolic dysfunction). - Aortic valve: There was trivial regurgitation. - Mitral valve: There was mild regurgitation. - Pulmonary arteries: PA peak pressure: 33 mm Hg (S).  Laboratory Data:  Chemistry Recent Labs  Lab 01/24/19 1408 01/28/19 1032  NA 140 139  K 4.9 4.0  CL 104 101  CO2 23 24  GLUCOSE 109* 144*  BUN 18 33*  CREATININE 1.15* 1.60*  CALCIUM 8.7 9.5  GFRNONAA 43* 29*  GFRAA 50* 34*  ANIONGAP  --  14    Recent Labs  Lab 01/24/19 1408 01/28/19 1032  PROT 5.9* 6.8  ALBUMIN 3.8 3.8  AST 13 17  ALT 10 15  ALKPHOS 70 56  BILITOT 0.4 0.6   Hematology Recent Labs  Lab 01/28/19 1032  WBC 12.3*  RBC 3.91  HGB 13.5  HCT 40.3  MCV 103.1*  MCH 34.5*  MCHC 33.5  RDW 12.8  PLT 232   Cardiac Enzymes Recent Labs  Lab 01/28/19 1032  TROPONINI 0.09*   No results for input(s): TROPIPOC in the last 168 hours.  BNP Recent Labs  Lab 01/28/19 1032  BNP 627.0*    DDimer No results for input(s): DDIMER in the last 168 hours.  Radiology/Studies:  Dg Chest 2 View  Result Date: 01/28/2019 CLINICAL DATA:  Shortness of breath and palpitations. EXAM: CHEST - 2 VIEW COMPARISON:  06/10/2016 FINDINGS: The cardiomediastinal silhouette is unchanged with normal heart size. Thoracic aortic atherosclerosis and tortuosity are noted. The lungs are well inflated without evidence of airspace consolidation, edema, pleural effusion, or pneumothorax. No acute osseous abnormality is seen. IMPRESSION: No active cardiopulmonary disease. Electronically Signed   By: Logan Bores M.D.   On: 01/28/2019 11:04    Assessment and Plan:   1. Atrial flutter 2. Tachy-brady syndrome -Pt presented for echo today which was unable to be done due to possible atrial tachycardia vs afluttter with RVR. Sent to ED. Metoprolol 5 mg IV given followed by Cardizem 20 mg IV with decrease of rate into the 30's, revealing atrial flutter. Cardizem drip was held off.  -Pt with  hx of atrial fibrillation on Xarelto for CHA2DS2/VAS Stroke Risk score of 6 (CHF, HTN, age (2), DM, female) -Pt has been having intermittent tachypalpitations and low HR into the 40's- sinus (at last office visit). She described possible conversion pauses and almost passing out. She did not want to wear a monitor to capture rhythm. She was referred for EP eval with planned appt on 01/31/19. -On metoprolol 25 mg BID, will hold for now.  -TSH normal. K+ normal at 4.0. Mag normal at 2.0.  -Unable to tolerate AV nodal blocking agents for treatment of tachyarrhythmia due to bradycardia.  -Discussed case with EP, Vick Frees, PA. Will have pt cardioverted in the ED and assess rhythm post cardioversion. She reports compliance with anticoagulation for the last month with no missed doses. Procedure explained to pt.  If stable post DCCV we can discharge her from the ED and have her keep EP appt for Monday.   3. Acute on chronic diastolic heart failure -BNP 627. Troponin 0.09. May be elevated due to demand ischemia in setting of tachycardia and chf. -Possibly tachycardia mediated.  -Takes lasix 40 mg am and 20 mg pm.  -No DOE or orthopnea.  -Will assess with better heart rate control. May need some IV lasix.   4. Hypertension -Home meds include benazepril 60 mg, metoprolol  25 mg BID -BP initially elevated at 155/113. Now down to 115/56 after IV metoprolol and Cardizem. Continue to monitor.   5. Hyperlipidemia -On lovastatin 40 mg daily. LDL 63 01/24/19. Continue current therapy.   6. Acute on CKD stage 3 -SCr elevated at 1.60. kidney injury may be related to decreased perfusion with tachyarrhythmia. Hopefully with improve with normal rhythm.   7. DM type 2 -A1c 6.4. Well controlled with A1c <7.   8. Hypothyroidism -On replacement therapy. TSH 3.210 on 01/24/19   Severity of Illness: The appropriate patient status for this patient is INPATIENT. Inpatient status is judged to be reasonable and necessary  in order to provide the required intensity of service to ensure the patient's safety. The patient's presenting symptoms, physical exam findings, and initial radiographic and laboratory data in the context of their chronic comorbidities is felt to place them at high risk for further clinical deterioration. Furthermore, it is not anticipated that the patient will be medically stable for discharge from the hospital within 2 midnights of admission. The following factors support the patient status of inpatient.   " The patient's presenting symptoms include palpitations, occ dizziness. " The worrisome physical exam findings include tachy heart sounds. " The initial radiographic and laboratory data are worrisome because of atrial flutter with RVR, elevated serum creatinine, elevated BNP. " The chronic co-morbidities include atrial fibrillation, atrial flutter, hypertension, diastolic heart failure, diabetes.  * I certify that at the point of admission it is my clinical judgment that the patient will require inpatient hospital care spanning beyond 2 midnights from the point of admission due to high intensity of service, high risk for further deterioration and high frequency of surveillance required.*   For questions or updates, please contact [place] Please consult [email or web address] for contact info under   Signed, Daune Perch, NP  01/28/2019 1:10 PM   The patient was seen, examined and discussed with Daune Perch, NP-C and I agree with the above.   85 y.o. female who very hard of hearing, with a past medical history significant for hypertension, hyperlipidemia, DM type 2, hypothyroidism and atrial fibrillation with CHADSVASC=3 on Xarelto, trivial AI, chronic diastolic CHF, CKD stage III. Last 2-D echo July 1610 normal LV systolic function.  Seen in the office by Cecilie Kicks, NP on 01/07/2019 at which time she reported some episodes of fast heart rates, 2-3 times per month. She has a history of  afib and some SVT, ;ast time 3 years ago. She is on anticoagulation and hasn't missed any doses. At that visit she was in sinus bradycardia with HR 44 bpm. She developed palpitations Monday night, today she presented for an echocardiogram in the office where she was found to be in atrial flutter with 2:1 block and ventricular rate 142 BPM. She was sent to ER-> given 20 mg iv push of cardizem and ventricular rate decreased to 38-45 BPM. This was discussed with EP service and they recommended to cardiovert in the ER, she hasn't missed any doses of Xarelto. She has an EP with appt on 6/15 with Dr. Rayann Heman. Plan for possible 30 day monitor vs Loop insertion.   On physical exam she has no JVDs, S1,2 irregular, slow, minimal crackles at the lung bases. No LE edema, warm extremities.   A/P DCCV in the ER, if successful discharge home If HR < 60 after cardioversion hold metoprolol Follow up with Dr Rayann Heman on Monday  Continue Xarelto She is mildly fluid overloaded, I would increase home lasix  to 40 MG PO BID  Ena Dawley, MD 01/28/2019

## 2019-01-28 NOTE — Sedation Documentation (Signed)
[id number] admin [id number]

## 2019-01-31 ENCOUNTER — Telehealth: Payer: Self-pay

## 2019-01-31 ENCOUNTER — Encounter: Payer: Self-pay | Admitting: Internal Medicine

## 2019-01-31 ENCOUNTER — Telehealth (INDEPENDENT_AMBULATORY_CARE_PROVIDER_SITE_OTHER): Payer: Medicare Other | Admitting: Internal Medicine

## 2019-01-31 VITALS — Ht 67.0 in | Wt 166.0 lb

## 2019-01-31 DIAGNOSIS — I4892 Unspecified atrial flutter: Secondary | ICD-10-CM

## 2019-01-31 DIAGNOSIS — I10 Essential (primary) hypertension: Secondary | ICD-10-CM | POA: Diagnosis not present

## 2019-01-31 DIAGNOSIS — I5032 Chronic diastolic (congestive) heart failure: Secondary | ICD-10-CM | POA: Diagnosis not present

## 2019-01-31 NOTE — Progress Notes (Signed)
Electrophysiology TeleHealth Note   Due to national recommendations of social distancing due to [place] 19, Audio/video telehealth visit is felt to be most appropriate for this patient at this time.  See MyChart message from today for patient consent regarding telehealth for [facility].   Date:  01/31/2019   ID:  Katie [name], DOB 1932-01-28, MRN [id number]  Location: home Provider location: [address] [place] Evaluation Performed: New patient consult  PCP:  [name], FNP  Cardiologist:  Lauree Chandler, MD  Electrophysiologist:  None   Chief Complaint:  Tachycardia/ bradycarida  History of Present Illness:    Katie [name] is a 85 y.o. female who presents via audio/video conferencing for a telehealth visit today.   The patient is referred for new consultation regarding bradycardia by Dr Angelena Form and Cecilie Kicks.  She has a h/o "afib"" though review of ekg 12/2015 reveals atrial flutter actually.  She has been treated with metoprolol and xarelto. Since that time.  She has occasional sinus bradycardia with fatigue.  She also has recently (Since the death of her husband) had multiple presentations for atrial flutter with RVR.  She most recently presented to the ED 01/28/2019 (Note reviewed) and required cardioversion.  She has felt "washed out" over the weekend.  Today, she denies symptoms of palpitations, chest pain, shortness of breath, orthopnea, PND, lower extremity edema, claudication, dizziness, presyncope, syncope, bleeding, or neurologic sequela. The patient is tolerating medications without difficulties and is otherwise without complaint today.   she denies symptoms of cough, fevers, chills, or new SOB worrisome for COVID 19.   Past Medical History:  Diagnosis Date  . Aortic insufficiency    a. Trivial AI by echo 02/2016.  [name] Atrial fibrillation and flutter ([place])    a. Coarse afib vs flutter by EKG 12/2015.  . Cataract   . Chronic  diastolic CHF (congestive heart failure) ([place])   . CKD (chronic kidney disease), stage III ([place])   . Edema   . GERD (gastroesophageal reflux disease)   . Hiatal hernia   . Hypercholesterolemia   . Hypertension   . Hypokalemia   . Hypothyroidism   . Left knee pain   . NIDDM (non-insulin dependent diabetes mellitus)    diet controlled   . Obesity   . Premature atrial contractions   . PVC's (premature ventricular contractions)   . URI (upper respiratory infection)   . Vitamin D deficiency     Past Surgical History:  Procedure Laterality Date  . ABDOMINAL HYSTERECTOMY    . APPENDECTOMY  1980  . BACK SURGERY    . CATARACT EXTRACTION, BILATERAL    . CHOLECYSTECTOMY  5/00  . COLONOSCOPY    . TONSILLECTOMY    . TOTAL ABDOMINAL HYSTERECTOMY W/ BILATERAL SALPINGOOPHORECTOMY  1980  . UPPER GASTROINTESTINAL ENDOSCOPY      Current Outpatient Medications  Medication Sig Dispense Refill  . benazepril (LOTENSIN) 40 MG tablet Take 40 mg by mouth 2 (two) times daily.    . clonazePAM (KLONOPIN) 0.5 MG tablet Take 1 tablet (0.5 mg total) by mouth 2 (two) times daily as needed for anxiety. 30 tablet 1  . etodolac (LODINE) 400 MG tablet Take 400 mg by mouth daily.    . ferrous sulfate 325 (65 FE) MG tablet Take 325 mg by mouth daily with breakfast.    . furosemide (LASIX) 40 MG tablet TAKE ONE TABLET BY MOUTH IN THE MORNING DAILY. TAKE 1/2 TABLET BY MOUTH IN THE  AFTERNOON DAILY. (Patient taking differently: Take 20-40 mg by mouth See admin instructions. TAKE ONE TABLET BY MOUTH IN THE MORNING DAILY. TAKE 1/2 TABLET BY MOUTH IN THE  AFTERNOON DAILY.) 135 tablet 1  . levothyroxine (SYNTHROID, LEVOTHROID) 50 MCG tablet TAKE 1 TABLET BY MOUTH  DAILY BEFORE BREAKFAST (Patient taking differently: Take 50 mcg by mouth daily before breakfast. TAKE 1 TABLET BY MOUTH  DAILY BEFORE BREAKFAST) 90 tablet 1  . loratadine (CLARITIN) 10 MG tablet Take 10 mg by mouth daily as needed for allergies.    [name] lovastatin  (MEVACOR) 40 MG tablet Take 2 tablets (80 mg total) by mouth at bedtime. 180 tablet 1  . metoprolol tartrate (LOPRESSOR) 25 MG tablet Take 1 tablet (25 mg total) by mouth 2 (two) times daily. 180 tablet 1  . omeprazole (PRILOSEC) 40 MG capsule Take 1 capsule (40 mg total) by mouth daily. 90 capsule 1  . potassium chloride SA (K-DUR,KLOR-CON) 20 MEQ tablet Take 1 tablet (20 mEq total) by mouth 2 (two) times daily. 180 tablet 1  . Propylene Glycol (SYSTANE BALANCE) 0.6 % SOLN Place 1 drop into both eyes daily as needed (dry eyes).    . raloxifene (EVISTA) 60 MG tablet Take 1 tablet (60 mg total) by mouth daily. 90 tablet 1  . Rivaroxaban (XARELTO) 15 MG TABS tablet TAKE 1 TABLET BY MOUTH ONCE DAILY WITH SUPPER (Patient taking differently: Take 15 mg by mouth daily with supper. TAKE 1 TABLET BY MOUTH ONCE DAILY WITH SUPPER) 90 tablet 1  . traMADol (ULTRAM) 50 MG tablet TAKE 1 TABLET BY MOUTH TWICE DAILY AS NEEDED FOR MODERATE PAIN OR SEVERE PAIN (Patient taking differently: Take 50 mg by mouth 2 (two) times daily as needed for moderate pain. TAKE 1 TABLET BY MOUTH TWICE DAILY AS NEEDED FOR MODERATE PAIN OR SEVERE PAIN) 60 tablet 2   No current facility-administered medications for this visit.     Allergies:   Amlodipine, Macrodantin, Metformin and related, and Penicillins   Social History:  The patient  reports that she has never smoked. She has never used smokeless tobacco. She reports that she does not drink alcohol or use drugs.   Family History:  The patient's  family history includes Atrial fibrillation in her sister; Colon cancer in her sister; Dementia in her brother; Diabetes in her brother, mother, sister, sister, sister, and son; Heart disease in her father; Liver cancer in her sister; Ovarian cancer in her sister; Stroke in her father and mother; Uterine cancer in her sister.    ROS:  Please see the history of present illness.   All other systems are personally reviewed and negative.     Exam:    Vital Signs:  Ht 5\' 7"  (1.702 m)   Wt 166 lb (75.3 kg)   BMI 26.00 kg/m    Well appearing, alert and conversant, hearing is diminished, regular work of breathing,  good skin color Eyes- anicteric, neuro- grossly intact, skin- no apparent rash or lesions or cyanosis, mouth- oral mucosa is pink   Labs/Other Tests and Data Reviewed:    Recent Labs: 01/28/2019: ALT 15; B Natriuretic Peptide 627.0; BUN 33; Creatinine, Ser 1.60; Hemoglobin 13.5; Magnesium 2.0; Platelets 232; Potassium 4.0; Sodium 139; TSH 2.659   Wt Readings from Last 3 Encounters:  01/31/19 166 lb (75.3 kg)  01/28/19 167 lb 15.9 oz (76.2 kg)  01/24/19 168 lb (76.2 kg)     Other studies personally reviewed: Additional studies/ records that were reviewed today  include: multiple prior ekgs, prior echo,  Hospital records, Dietrich Pates notes  Review of the above records today demonstrates: as above  ASSESSMENT & PLAN:    1.  Atrial flutter Though not typical by ekg, I suspect that this is isthmus dependant atrial flutter. She is appropriately anticoagulated with xarelto.  Though she carries a diagnosis of afib also, I do not see this well documented.  Certainly, her primary arrhythmia has been atrial flutter. chads2vasc score is at least 5.   Therapeutic strategies for atrial flutter including medicine and ablation were discussed in detail with the patient today. Risk, benefits, and alternatives to EP study and radiofrequency ablation were also discussed in detail today. These risks include but are not limited to stroke, bleeding, vascular damage, tamponade, perforation, damage to the heart and other structures, AV block requiring pacemaker, worsening renal function, and death. The patient understands these risk and wishes to proceed.  We will therefore proceed with catheter ablation at the next available time.   2. Sinus bradycardia Symptomatic Hopefully, if we can successfully ablate her atrial arrhythmias we  can stop metoprolol.  If she continues to have symptomatic sinus bradycardia or we cannot successfully ablate her arrhythmias, she will require pacing.  3. HTN Stable No change required today  4. Chronic diastolic dysfunction Stable No change required today  5. COVID screen The patient does not have any symptoms that suggest any further testing/ screening at this time.  Social distancing reinforced today.    Current medicines are reviewed at length with the patient today.   The patient does not have concerns regarding her medicines.  The following changes were made today:  none  Labs/ tests ordered today include:  No orders of the defined types were placed in this encounter.   Patient Risk:  after full review of this patients clinical status, I feel that they are at moderate risk at this time.  Today, I have spent 20 minutes with the patient with telehealth technology discussing atrial arrhythmias and bradycardia with patient and her son .    Signed, Thompson Grayer MD, [place] 01/31/2019 9:46 AM   [facility] [address] [place] [address] [phone] (office) [phone] (fax)

## 2019-01-31 NOTE — H&P (View-Only) (Signed)
Electrophysiology TeleHealth Note   Due to national recommendations of social distancing due to [place] 19, Audio/video telehealth visit is felt to be most appropriate for this patient at this time.  See MyChart message from today for patient consent regarding telehealth for [facility].   Date:  01/31/2019   ID:  Katie [name], DOB 06-17-1932, MRN [id number]  Location: home Provider location: [address], [place] Evaluation Performed: New patient consult  PCP:  [name], FNP  Cardiologist:  Lauree Chandler, MD  Electrophysiologist:  None   Chief Complaint:  Tachycardia/ bradycarida  History of Present Illness:    Katie [name] is a 85 y.o. female who presents via audio/video conferencing for a telehealth visit today.   The patient is referred for new consultation regarding bradycardia by Dr Angelena Form and Cecilie Kicks.  She has a h/o "afib"" though review of ekg 12/2015 reveals atrial flutter actually.  She has been treated with metoprolol and xarelto. Since that time.  She has occasional sinus bradycardia with fatigue.  She also has recently (Since the death of her husband) had multiple presentations for atrial flutter with RVR.  She most recently presented to the ED 01/28/2019 (Note reviewed) and required cardioversion.  She has felt "washed out" over the weekend.  Today, she denies symptoms of palpitations, chest pain, shortness of breath, orthopnea, PND, lower extremity edema, claudication, dizziness, presyncope, syncope, bleeding, or neurologic sequela. The patient is tolerating medications without difficulties and is otherwise without complaint today.   she denies symptoms of cough, fevers, chills, or new SOB worrisome for COVID 19.   Past Medical History:  Diagnosis Date  . Aortic insufficiency    a. Trivial AI by echo 02/2016.  [name] Atrial fibrillation and flutter ([place])    a. Coarse afib vs flutter by EKG 12/2015.  . Cataract   . Chronic  diastolic CHF (congestive heart failure) ([place])   . CKD (chronic kidney disease), stage III ([place])   . Edema   . GERD (gastroesophageal reflux disease)   . Hiatal hernia   . Hypercholesterolemia   . Hypertension   . Hypokalemia   . Hypothyroidism   . Left knee pain   . NIDDM (non-insulin dependent diabetes mellitus)    diet controlled   . Obesity   . Premature atrial contractions   . PVC's (premature ventricular contractions)   . URI (upper respiratory infection)   . Vitamin D deficiency     Past Surgical History:  Procedure Laterality Date  . ABDOMINAL HYSTERECTOMY    . APPENDECTOMY  1980  . BACK SURGERY    . CATARACT EXTRACTION, BILATERAL    . CHOLECYSTECTOMY  5/00  . COLONOSCOPY    . TONSILLECTOMY    . TOTAL ABDOMINAL HYSTERECTOMY W/ BILATERAL SALPINGOOPHORECTOMY  1980  . UPPER GASTROINTESTINAL ENDOSCOPY      Current Outpatient Medications  Medication Sig Dispense Refill  . benazepril (LOTENSIN) 40 MG tablet Take 40 mg by mouth 2 (two) times daily.    . clonazePAM (KLONOPIN) 0.5 MG tablet Take 1 tablet (0.5 mg total) by mouth 2 (two) times daily as needed for anxiety. 30 tablet 1  . etodolac (LODINE) 400 MG tablet Take 400 mg by mouth daily.    . ferrous sulfate 325 (65 FE) MG tablet Take 325 mg by mouth daily with breakfast.    . furosemide (LASIX) 40 MG tablet TAKE ONE TABLET BY MOUTH IN THE MORNING DAILY. TAKE 1/2 TABLET BY MOUTH IN THE  AFTERNOON DAILY. (Patient taking differently: Take 20-40 mg by mouth See admin instructions. TAKE ONE TABLET BY MOUTH IN THE MORNING DAILY. TAKE 1/2 TABLET BY MOUTH IN THE  AFTERNOON DAILY.) 135 tablet 1  . levothyroxine (SYNTHROID, LEVOTHROID) 50 MCG tablet TAKE 1 TABLET BY MOUTH  DAILY BEFORE BREAKFAST (Patient taking differently: Take 50 mcg by mouth daily before breakfast. TAKE 1 TABLET BY MOUTH  DAILY BEFORE BREAKFAST) 90 tablet 1  . loratadine (CLARITIN) 10 MG tablet Take 10 mg by mouth daily as needed for allergies.    [name] lovastatin  (MEVACOR) 40 MG tablet Take 2 tablets (80 mg total) by mouth at bedtime. 180 tablet 1  . metoprolol tartrate (LOPRESSOR) 25 MG tablet Take 1 tablet (25 mg total) by mouth 2 (two) times daily. 180 tablet 1  . omeprazole (PRILOSEC) 40 MG capsule Take 1 capsule (40 mg total) by mouth daily. 90 capsule 1  . potassium chloride SA (K-DUR,KLOR-CON) 20 MEQ tablet Take 1 tablet (20 mEq total) by mouth 2 (two) times daily. 180 tablet 1  . Propylene Glycol (SYSTANE BALANCE) 0.6 % SOLN Place 1 drop into both eyes daily as needed (dry eyes).    . raloxifene (EVISTA) 60 MG tablet Take 1 tablet (60 mg total) by mouth daily. 90 tablet 1  . Rivaroxaban (XARELTO) 15 MG TABS tablet TAKE 1 TABLET BY MOUTH ONCE DAILY WITH SUPPER (Patient taking differently: Take 15 mg by mouth daily with supper. TAKE 1 TABLET BY MOUTH ONCE DAILY WITH SUPPER) 90 tablet 1  . traMADol (ULTRAM) 50 MG tablet TAKE 1 TABLET BY MOUTH TWICE DAILY AS NEEDED FOR MODERATE PAIN OR SEVERE PAIN (Patient taking differently: Take 50 mg by mouth 2 (two) times daily as needed for moderate pain. TAKE 1 TABLET BY MOUTH TWICE DAILY AS NEEDED FOR MODERATE PAIN OR SEVERE PAIN) 60 tablet 2   No current facility-administered medications for this visit.     Allergies:   Amlodipine, Macrodantin, Metformin and related, and Penicillins   Social History:  The patient  reports that she has never smoked. She has never used smokeless tobacco. She reports that she does not drink alcohol or use drugs.   Family History:  The patient's  family history includes Atrial fibrillation in her sister; Colon cancer in her sister; Dementia in her brother; Diabetes in her brother, mother, sister, sister, sister, and son; Heart disease in her father; Liver cancer in her sister; Ovarian cancer in her sister; Stroke in her father and mother; Uterine cancer in her sister.    ROS:  Please see the history of present illness.   All other systems are personally reviewed and negative.     Exam:    Vital Signs:  Ht 5\' 7"  (1.702 m)   Wt 166 lb (75.3 kg)   BMI 26.00 kg/m    Well appearing, alert and conversant, hearing is diminished, regular work of breathing,  good skin color Eyes- anicteric, neuro- grossly intact, skin- no apparent rash or lesions or cyanosis, mouth- oral mucosa is pink   Labs/Other Tests and Data Reviewed:    Recent Labs: 01/28/2019: ALT 15; B Natriuretic Peptide 627.0; BUN 33; Creatinine, Ser 1.60; Hemoglobin 13.5; Magnesium 2.0; Platelets 232; Potassium 4.0; Sodium 139; TSH 2.659   Wt Readings from Last 3 Encounters:  01/31/19 166 lb (75.3 kg)  01/28/19 167 lb 15.9 oz (76.2 kg)  01/24/19 168 lb (76.2 kg)     Other studies personally reviewed: Additional studies/ records that were reviewed today  include: multiple prior ekgs, prior echo,  Hospital records, [facility] Pates notes  Review of the above records today demonstrates: as above  ASSESSMENT & PLAN:    1.  Atrial flutter Though not typical by ekg, I suspect that this is isthmus dependant atrial flutter. She is appropriately anticoagulated with xarelto.  Though she carries a diagnosis of afib also, I do not see this well documented.  Certainly, her primary arrhythmia has been atrial flutter. chads2vasc score is at least 5.   Therapeutic strategies for atrial flutter including medicine and ablation were discussed in detail with the patient today. Risk, benefits, and alternatives to EP study and radiofrequency ablation were also discussed in detail today. These risks include but are not limited to stroke, bleeding, vascular damage, tamponade, perforation, damage to the heart and other structures, AV block requiring pacemaker, worsening renal function, and death. The patient understands these risk and wishes to proceed.  We will therefore proceed with catheter ablation at the next available time.   2. Sinus bradycardia Symptomatic Hopefully, if we can successfully ablate her atrial arrhythmias we  can stop metoprolol.  If she continues to have symptomatic sinus bradycardia or we cannot successfully ablate her arrhythmias, she will require pacing.  3. HTN Stable No change required today  4. Chronic diastolic dysfunction Stable No change required today  5. COVID screen The patient does not have any symptoms that suggest any further testing/ screening at this time.  Social distancing reinforced today.    Current medicines are reviewed at length with the patient today.   The patient does not have concerns regarding her medicines.  The following changes were made today:  none  Labs/ tests ordered today include:  No orders of the defined types were placed in this encounter.   Patient Risk:  after full review of this patients clinical status, I feel that they are at moderate risk at this time.  Today, I have spent 20 minutes with the patient with telehealth technology discussing atrial arrhythmias and bradycardia with patient and her son .    Signed, Thompson Grayer MD, [place] 01/31/2019 9:46 AM   [facility] [address] [place] [address] [phone] (office) [phone] (fax)

## 2019-01-31 NOTE — Telephone Encounter (Signed)
Verified Pt will not need to hold anticoagulation for spinal injections.  Pt scheduled for aflutter ablation on June 23 2nd case d/t distance from [place].  After much discussion, Pt would like to come to [place] for labs and covid test.  Scheduled for 02/04/2019  Instruction letter mailed today

## 2019-01-31 NOTE — Telephone Encounter (Signed)
-----   Message from Thompson Grayer, MD sent at 01/31/2019 10:01 AM EDT ----- Please schedule atrial flutter ablation with carto and anesthesia at the next available time

## 2019-01-31 NOTE — Telephone Encounter (Signed)
Message left for Dr. Romona Curls office to confirm Pt will not be holding her blood thinner prior to her injections scheduled for this Wednesday.  Left this nurse name and direct # to call back.

## 2019-02-02 ENCOUNTER — Ambulatory Visit (INDEPENDENT_AMBULATORY_CARE_PROVIDER_SITE_OTHER): Payer: Medicare Other | Admitting: Physical Medicine and Rehabilitation

## 2019-02-02 ENCOUNTER — Other Ambulatory Visit: Payer: Self-pay

## 2019-02-02 ENCOUNTER — Ambulatory Visit: Payer: Self-pay

## 2019-02-02 ENCOUNTER — Telehealth: Payer: Self-pay | Admitting: Cardiovascular Disease

## 2019-02-02 ENCOUNTER — Encounter: Payer: Self-pay | Admitting: Physical Medicine and Rehabilitation

## 2019-02-02 VITALS — BP 131/91 | HR 131

## 2019-02-02 DIAGNOSIS — M5416 Radiculopathy, lumbar region: Secondary | ICD-10-CM | POA: Diagnosis not present

## 2019-02-02 MED ORDER — BETAMETHASONE SOD PHOS & ACET 6 (3-3) MG/ML IJ SUSP
12.0000 mg | Freq: Once | INTRAMUSCULAR | Status: AC
  Administered 2019-02-02: 12 mg

## 2019-02-02 NOTE — Telephone Encounter (Signed)
Returned call to Pt.  Pt states she feels better but still feels like her heart is out of rhythm.  Advised Pt that the hopeful "fix" for that is her procedure coming up on Tuesday.  Advised that between now and Tuesday we are just trying to keep her heart rate less than 100.  Advised she is safe even with her heart out of rhythm because she is taking her Xarelto.  Pt asked if getting back injections was safe with her heart out of rhythm.  Advised as long as Pt is taking her Xarelto as prescribed it should be ok but she should contact her doctor.  Rediscussed all directions for upcoming procedure.  Advised Pt to call this nurse if she has any further issues.  Reiterated for her to take her metoprolol when she wakes up even if she hasn't eaten.  Pt indicates understanding.

## 2019-02-02 NOTE — Telephone Encounter (Signed)
New message  Patient c/o Palpitations:  High priority if patient c/o lightheadedness, shortness of breath, or chest pain  1) How long have you had palpitations/irregular HR/ Afib? Are you having the symptoms now?Patient states that her heart is irregular it started at 3:30 am this morning, yes   2) Are you currently experiencing lightheadedness, SOB or CP?no   3) Do you have a history of afib (atrial fibrillation) or irregular heart rhythm? Yes   4) Have you checked your BP or HR? (document readings if available): patient has not checked either today  5) Are you experiencing any other symptoms? weak   Patient states that she is considering going to the ER. Please call asap.

## 2019-02-02 NOTE — Progress Notes (Signed)
 .  Numeric Pain Rating Scale and Functional Assessment Average Pain 7   In the last MONTH (on 0-10 scale) has pain interfered with the following?  1. General activity like being  able to carry out your everyday physical activities such as walking, climbing stairs, carrying groceries, or moving a chair?  Rating(6)   +Driver, +BT(xarelto, ok for inj), -Dye Allergies.

## 2019-02-02 NOTE — Telephone Encounter (Signed)
Returned call to Pt.  Pt states her heart "feels like it is flying".    Pt has not taken her morning medications (metoprolol) because she said she hasn't eaten yet because she has felt bad since 3:30 am.  Advised Pt she did not need to eat with her  Metoprolol.  Advised Pt to take her metoprolol NOW.  Will return call to pt in one hour to see if this has helped.  Will also speak with Dr. Loni Muse to see if she can take any addl metoprolol if she continues to feel like her heart is racing.  Will cont to monitor.

## 2019-02-03 ENCOUNTER — Telehealth: Payer: Self-pay | Admitting: *Deleted

## 2019-02-03 NOTE — Telephone Encounter (Signed)
    COVID-19 Pre-Screening Questions:  . In the past 7 to 10 days have you had a cough,  shortness of breath, headache, congestion, fever (100 or greater) body aches, chills, sore throat, or sudden loss of taste or sense of smell? . Have you been around anyone with known Covid 19. . Have you been around anyone who is awaiting Covid 19 test results in the past 7 to 10 days? . Have you been around anyone who has been exposed to Covid 19, or has mentioned symptoms of Covid 19 within the past 7 to 10 days?  If you have any concerns/questions about symptoms patients report during screening (either on the phone or at threshold). Contact the provider seeing the patient or DOD for further guidance.  If neither are available contact a member of the leadership team.           Pa having Covid 19 test today . Awaiting test results. KB 02-04-2019

## 2019-02-04 ENCOUNTER — Other Ambulatory Visit (HOSPITAL_COMMUNITY)
Admission: RE | Admit: 2019-02-04 | Discharge: 2019-02-04 | Disposition: A | Discharge status: Home / Self Care | Payer: Medicare Other | Source: Ambulatory Visit | Attending: Internal Medicine | Admitting: Internal Medicine

## 2019-02-04 ENCOUNTER — Other Ambulatory Visit: Payer: Medicare Other | Admitting: *Deleted

## 2019-02-04 ENCOUNTER — Other Ambulatory Visit: Payer: Self-pay

## 2019-02-04 DIAGNOSIS — I4892 Unspecified atrial flutter: Secondary | ICD-10-CM

## 2019-02-04 DIAGNOSIS — Z1159 Encounter for screening for other viral diseases: Secondary | ICD-10-CM | POA: Insufficient documentation

## 2019-02-04 LAB — CBC WITH DIFFERENTIAL/PLATELET
Basophils Absolute: 0 10*3/uL (ref 0.0–0.2)
Basos: 0 %
EOS (ABSOLUTE): 0 10*3/uL (ref 0.0–0.4)
Eos: 0 %
Hematocrit: 37.3 % (ref 34.0–46.6)
Hemoglobin: 13.3 g/dL (ref 11.1–15.9)
Immature Grans (Abs): 0.1 10*3/uL (ref 0.0–0.1)
Immature Granulocytes: 1 %
Lymphocytes Absolute: 2.3 10*3/uL (ref 0.7–3.1)
Lymphs: 21 %
MCH: 36.2 pg — ABNORMAL HIGH (ref 26.6–33.0)
MCHC: 35.7 g/dL (ref 31.5–35.7)
MCV: 102 fL — ABNORMAL HIGH (ref 79–97)
Monocytes Absolute: 0.6 10*3/uL (ref 0.1–0.9)
Monocytes: 5 %
Neutrophils Absolute: 8.1 10*3/uL — ABNORMAL HIGH (ref 1.4–7.0)
Neutrophils: 73 %
Platelets: 214 10*3/uL (ref 150–450)
RBC: 3.67 x10E6/uL — ABNORMAL LOW (ref 3.77–5.28)
RDW: 12.6 % (ref 11.7–15.4)
WBC: 11 10*3/uL — ABNORMAL HIGH (ref 3.4–10.8)

## 2019-02-04 LAB — SARS CORONAVIRUS 2 (TAT 6-24 HRS): SARS Coronavirus 2: NEGATIVE

## 2019-02-04 LAB — BASIC METABOLIC PANEL
BUN/Creatinine Ratio: 24 (ref 12–28)
BUN: 46 mg/dL — ABNORMAL HIGH (ref 8–27)
CO2: 23 mmol/L (ref 20–29)
Calcium: 9.7 mg/dL (ref 8.7–10.3)
Chloride: 98 mmol/L (ref 96–106)
Creatinine, Ser: 1.88 mg/dL — ABNORMAL HIGH (ref 0.57–1.00)
GFR calc Af Amer: 28 mL/min/{1.73_m2} — ABNORMAL LOW (ref >59)
GFR calc non Af Amer: 24 mL/min/{1.73_m2} — ABNORMAL LOW (ref >59)
Glucose: 170 mg/dL — ABNORMAL HIGH (ref 65–99)
Potassium: 4.5 mmol/L (ref 3.5–5.2)
Sodium: 137 mmol/L (ref 134–144)

## 2019-02-08 ENCOUNTER — Ambulatory Visit (HOSPITAL_COMMUNITY)
Admission: RE | Admit: 2019-02-08 | Discharge: 2019-02-09 | Disposition: A | Discharge status: Home / Self Care | Payer: Medicare Other | Attending: Internal Medicine | Admitting: Internal Medicine

## 2019-02-08 ENCOUNTER — Ambulatory Visit (HOSPITAL_COMMUNITY): Payer: Medicare Other | Admitting: Certified Registered Nurse Anesthetist

## 2019-02-08 ENCOUNTER — Other Ambulatory Visit: Payer: Self-pay

## 2019-02-08 ENCOUNTER — Encounter (HOSPITAL_COMMUNITY)
Admission: RE | Disposition: A | Discharge status: Home / Self Care | Payer: Self-pay | Source: Home / Self Care | Attending: Internal Medicine

## 2019-02-08 DIAGNOSIS — Z90710 Acquired absence of both cervix and uterus: Secondary | ICD-10-CM | POA: Diagnosis not present

## 2019-02-08 DIAGNOSIS — Z7901 Long term (current) use of anticoagulants: Secondary | ICD-10-CM | POA: Diagnosis not present

## 2019-02-08 DIAGNOSIS — Z833 Family history of diabetes mellitus: Secondary | ICD-10-CM | POA: Insufficient documentation

## 2019-02-08 DIAGNOSIS — Z880 Allergy status to penicillin: Secondary | ICD-10-CM | POA: Diagnosis not present

## 2019-02-08 DIAGNOSIS — N183 Chronic kidney disease, stage 3 (moderate): Secondary | ICD-10-CM | POA: Diagnosis not present

## 2019-02-08 DIAGNOSIS — I4892 Unspecified atrial flutter: Secondary | ICD-10-CM | POA: Diagnosis not present

## 2019-02-08 DIAGNOSIS — I4891 Unspecified atrial fibrillation: Secondary | ICD-10-CM | POA: Diagnosis not present

## 2019-02-08 DIAGNOSIS — I491 Atrial premature depolarization: Secondary | ICD-10-CM | POA: Diagnosis not present

## 2019-02-08 DIAGNOSIS — E669 Obesity, unspecified: Secondary | ICD-10-CM | POA: Insufficient documentation

## 2019-02-08 DIAGNOSIS — I130 Hypertensive heart and chronic kidney disease with heart failure and stage 1 through stage 4 chronic kidney disease, or unspecified chronic kidney disease: Secondary | ICD-10-CM | POA: Insufficient documentation

## 2019-02-08 DIAGNOSIS — Z888 Allergy status to other drugs, medicaments and biological substances status: Secondary | ICD-10-CM | POA: Diagnosis not present

## 2019-02-08 DIAGNOSIS — Z79890 Hormone replacement therapy (postmenopausal): Secondary | ICD-10-CM | POA: Insufficient documentation

## 2019-02-08 DIAGNOSIS — I10 Essential (primary) hypertension: Secondary | ICD-10-CM | POA: Diagnosis not present

## 2019-02-08 DIAGNOSIS — E559 Vitamin D deficiency, unspecified: Secondary | ICD-10-CM | POA: Diagnosis not present

## 2019-02-08 DIAGNOSIS — Z8249 Family history of ischemic heart disease and other diseases of the circulatory system: Secondary | ICD-10-CM | POA: Insufficient documentation

## 2019-02-08 DIAGNOSIS — K219 Gastro-esophageal reflux disease without esophagitis: Secondary | ICD-10-CM | POA: Diagnosis not present

## 2019-02-08 DIAGNOSIS — Z90722 Acquired absence of ovaries, bilateral: Secondary | ICD-10-CM | POA: Diagnosis not present

## 2019-02-08 DIAGNOSIS — E7800 Pure hypercholesterolemia, unspecified: Secondary | ICD-10-CM | POA: Insufficient documentation

## 2019-02-08 DIAGNOSIS — E039 Hypothyroidism, unspecified: Secondary | ICD-10-CM | POA: Insufficient documentation

## 2019-02-08 DIAGNOSIS — Z79899 Other long term (current) drug therapy: Secondary | ICD-10-CM | POA: Diagnosis not present

## 2019-02-08 DIAGNOSIS — E119 Type 2 diabetes mellitus without complications: Secondary | ICD-10-CM | POA: Diagnosis not present

## 2019-02-08 DIAGNOSIS — E1122 Type 2 diabetes mellitus with diabetic chronic kidney disease: Secondary | ICD-10-CM | POA: Insufficient documentation

## 2019-02-08 DIAGNOSIS — Z823 Family history of stroke: Secondary | ICD-10-CM | POA: Insufficient documentation

## 2019-02-08 DIAGNOSIS — I5032 Chronic diastolic (congestive) heart failure: Secondary | ICD-10-CM | POA: Diagnosis not present

## 2019-02-08 DIAGNOSIS — Z6826 Body mass index (BMI) 26.0-26.9, adult: Secondary | ICD-10-CM | POA: Insufficient documentation

## 2019-02-08 DIAGNOSIS — I483 Typical atrial flutter: Secondary | ICD-10-CM

## 2019-02-08 HISTORY — PX: A-FLUTTER ABLATION: EP1230

## 2019-02-08 LAB — GLUCOSE, CAPILLARY
Glucose-Capillary: 162 mg/dL — ABNORMAL HIGH (ref 70–99)
Glucose-Capillary: 146 mg/dL — ABNORMAL HIGH (ref 70–99)
Glucose-Capillary: 168 mg/dL — ABNORMAL HIGH (ref 70–99)

## 2019-02-08 SURGERY — A-FLUTTER ABLATION
Anesthesia: General

## 2019-02-08 MED ORDER — HYDROCODONE-ACETAMINOPHEN 5-325 MG PO TABS: 1.0000 | ORAL_TABLET | ORAL | Status: DC | PRN

## 2019-02-08 MED ORDER — RIVAROXABAN 15 MG PO TABS
15.0000 mg | ORAL_TABLET | Freq: Every day | ORAL | Status: DC
  Administered 2019-02-08: 15 mg via ORAL
  Filled 2019-02-08 (×3): qty 1

## 2019-02-08 MED ORDER — SODIUM CHLORIDE 0.9% FLUSH
3.0000 mL | Freq: Two times a day (BID) | INTRAVENOUS | Status: DC
  Administered 2019-02-08 – 2019-02-09 (×2): 3 mL via INTRAVENOUS

## 2019-02-08 MED ORDER — BUPIVACAINE HCL (PF) 0.25 % IJ SOLN
INTRAMUSCULAR | Status: AC
  Filled 2019-02-08: qty 30

## 2019-02-08 MED ORDER — ACETAMINOPHEN 325 MG PO TABS: 650.0000 mg | ORAL_TABLET | ORAL | Status: DC | PRN

## 2019-02-08 MED ORDER — ONDANSETRON HCL 4 MG/2ML IJ SOLN: 4.0000 mg | Freq: Four times a day (QID) | INTRAMUSCULAR | Status: DC | PRN

## 2019-02-08 MED ORDER — SODIUM CHLORIDE 0.9 % IV SOLN
INTRAVENOUS | Status: DC
  Administered 2019-02-08: 09:00:00 via INTRAVENOUS

## 2019-02-08 MED ORDER — SODIUM CHLORIDE 0.9 % IV SOLN: 250.0000 mL | INTRAVENOUS | Status: DC | PRN

## 2019-02-08 MED ORDER — EPHEDRINE SULFATE 50 MG/ML IJ SOLN
INTRAMUSCULAR | Status: DC | PRN
  Administered 2019-02-08: 10 mg via INTRAVENOUS

## 2019-02-08 MED ORDER — PHENYLEPHRINE HCL (PRESSORS) 10 MG/ML IV SOLN
INTRAVENOUS | Status: DC | PRN
  Administered 2019-02-08 (×2): 80 ug via INTRAVENOUS

## 2019-02-08 MED ORDER — SODIUM CHLORIDE 0.9 % IV SOLN
INTRAVENOUS | Status: DC | PRN
  Administered 2019-02-08: 30 ug/min via INTRAVENOUS

## 2019-02-08 MED ORDER — ATROPINE SULFATE 1 MG/10ML IJ SOSY
PREFILLED_SYRINGE | INTRAMUSCULAR | Status: AC
  Filled 2019-02-08: qty 10

## 2019-02-08 MED ORDER — BUPIVACAINE HCL (PF) 0.25 % IJ SOLN
INTRAMUSCULAR | Status: DC | PRN
  Administered 2019-02-08: 30 mL

## 2019-02-08 MED ORDER — LEVOTHYROXINE SODIUM 50 MCG PO TABS
50.0000 ug | ORAL_TABLET | Freq: Every day | ORAL | Status: DC
  Administered 2019-02-09: 50 ug via ORAL
  Filled 2019-02-08: qty 1

## 2019-02-08 MED ORDER — PROPOFOL 10 MG/ML IV BOLUS
INTRAVENOUS | Status: DC | PRN
  Administered 2019-02-08: 100 mg via INTRAVENOUS
  Administered 2019-02-08: 50 mg via INTRAVENOUS

## 2019-02-08 MED ORDER — TRAMADOL HCL 50 MG PO TABS
50.0000 mg | ORAL_TABLET | Freq: Two times a day (BID) | ORAL | Status: DC | PRN
  Administered 2019-02-08: 50 mg via ORAL
  Filled 2019-02-08: qty 1

## 2019-02-08 MED ORDER — FENTANYL CITRATE (PF) 250 MCG/5ML IJ SOLN
INTRAMUSCULAR | Status: DC | PRN
  Administered 2019-02-08: 25 ug via INTRAVENOUS

## 2019-02-08 MED ORDER — BENAZEPRIL HCL 10 MG PO TABS
60.0000 mg | ORAL_TABLET | Freq: Every day | ORAL | Status: DC
  Administered 2019-02-09: 60 mg via ORAL
  Filled 2019-02-08: qty 6

## 2019-02-08 MED ORDER — CLONAZEPAM 0.5 MG PO TABS: 0.5000 mg | ORAL_TABLET | Freq: Two times a day (BID) | ORAL | Status: DC | PRN

## 2019-02-08 MED ORDER — ONDANSETRON HCL 4 MG/2ML IJ SOLN
INTRAMUSCULAR | Status: DC | PRN
  Administered 2019-02-08: 4 mg via INTRAVENOUS

## 2019-02-08 MED ORDER — SODIUM CHLORIDE 0.9% FLUSH: 3.0000 mL | INTRAVENOUS | Status: DC | PRN

## 2019-02-08 MED ORDER — LIDOCAINE 2% (20 MG/ML) 5 ML SYRINGE
INTRAMUSCULAR | Status: DC | PRN
  Administered 2019-02-08: 50 mg via INTRAVENOUS

## 2019-02-08 SURGICAL SUPPLY — 9 items
BLANKET WARM UNDERBOD FULL ACC (MISCELLANEOUS) ×1 IMPLANT
CATH EZ STEER NAV 8MM F-J CUR (ABLATOR) ×1 IMPLANT
CATH WEBSTER BI DIR CS D-F CRV (CATHETERS) ×1 IMPLANT
PACK EP LATEX FREE (CUSTOM PROCEDURE TRAY) ×2
PACK EP LF (CUSTOM PROCEDURE TRAY) ×1 IMPLANT
PAD PRO RADIOLUCENT 2001M-C (PAD) ×2 IMPLANT
PATCH CARTO3 (PAD) ×1 IMPLANT
SHEATH PINNACLE 7F 10CM (SHEATH) ×1 IMPLANT
SHEATH PINNACLE 8F 10CM (SHEATH) ×1 IMPLANT

## 2019-02-08 NOTE — Plan of Care (Signed)
  Problem: Education: Goal: Knowledge of disease or condition will improve Outcome: Progressing Goal: Understanding of medication regimen will improve Outcome: Progressing Goal: Individualized Educational Video(s) Outcome: Progressing   

## 2019-02-08 NOTE — Anesthesia Procedure Notes (Signed)
Procedure Name: LMA Insertion Date/Time: 02/08/2019 11:28 AM Performed by: Glynda Jaeger, CRNA Pre-anesthesia Checklist: Patient identified, Emergency Drugs available, Suction available, Timeout performed and Patient being monitored Patient Re-evaluated:Patient Re-evaluated prior to induction Oxygen Delivery Method: Circle system utilized Preoxygenation: Pre-oxygenation with 100% oxygen Induction Type: IV induction Ventilation: Mask ventilation without difficulty LMA: LMA inserted LMA Size: 4.0 Number of attempts: 1 Tube secured with: Tape Dental Injury: Teeth and Oropharynx as per pre-operative assessment

## 2019-02-08 NOTE — Progress Notes (Signed)
Site Area: R femoral venous x 2 Site Prior to Removal: Level 0 Pressure Applied For: 14 minutes Manual: YES Patient Status During Pull: STABLE Post Pull Site: Level 0 Post Pull Instructions Given: YES Post Pull Pulses Present: DP palpable +2 Dressing Applied: Sterile 4x4 gauze and tegaderm Bedrest begins @ 4158 Comments:  Removed by Verlin Fester, RN

## 2019-02-08 NOTE — Anesthesia Postprocedure Evaluation (Signed)
Anesthesia Post Note  Patient: Katie [name]  Procedure(s) Performed: A-FLUTTER ABLATION (N/A )     Patient location during evaluation: Cath Lab Anesthesia Type: General Level of consciousness: awake and alert, patient cooperative and oriented Pain management: pain level controlled Vital Signs Assessment: post-procedure vital signs reviewed and stable Respiratory status: spontaneous breathing, nonlabored ventilation, respiratory function stable and patient connected to nasal cannula oxygen Cardiovascular status: blood pressure returned to baseline and stable Postop Assessment: no apparent nausea or vomiting Anesthetic complications: no    Last Vitals:  Vitals:   02/08/19 0839 02/08/19 1252  BP:  (!) 134/49  Pulse:  62  Resp:  20  Temp: (!) 36.2 C (!) 36.4 C  SpO2: 99% 100%    Last Pain:  Vitals:   02/08/19 1252  TempSrc: Tympanic  PainSc: 0-No pain                 Siarra Gilkerson,E. Kenza Munar

## 2019-02-08 NOTE — Transfer of Care (Signed)
Immediate Anesthesia Transfer of Care Note  Patient: Katie [name]  Procedure(s) Performed: A-FLUTTER ABLATION (N/A )  Patient Location: Cath Lab  Anesthesia Type:General  Level of Consciousness: awake, alert , patient cooperative and responds to stimulation  Airway & Oxygen Therapy: Patient Spontanous Breathing and Patient connected to face mask oxygen  Post-op Assessment: Report given to RN and Post -op Vital signs reviewed and stable  Post vital signs: Reviewed and stable  Last Vitals:  Vitals Value Taken Time  BP    Temp    Pulse    Resp    SpO2      Last Pain:  Vitals:   02/08/19 0908  TempSrc:   PainSc: 0-No pain         Complications: No apparent anesthesia complications

## 2019-02-08 NOTE — Interval H&P Note (Signed)
History and Physical Interval Note:  02/08/2019 11:24 AM  [name]  has presented today for surgery, with the diagnosis of aflutter.  The various methods of treatment have been discussed with the patient and family. After consideration of risks, benefits and other options for treatment, the patient has consented to  Procedure(s): A-FLUTTER ABLATION (N/A) as a surgical intervention.  The patient's history has been reviewed, patient examined, no change in status, stable for surgery.  I have reviewed the patient's chart and labs.  Questions were answered to the patient's satisfaction.    Risk, benefits, and alternatives to EP study and radiofrequency ablation were also discussed in detail today. These risks include but are not limited to stroke, bleeding, vascular damage, tamponade, perforation, damage to the heart and other structures, AV block requiring pacemaker, worsening renal function, and death. The patient understands these risk and wishes to proceed.   If isthmus dependant, will plan ablation.  Otherwise, will plan AAD therapy.  Thompson Grayer MD, [place] 02/08/2019 11:24 AM

## 2019-02-08 NOTE — Discharge Summary (Addendum)
ELECTROPHYSIOLOGY PROCEDURE DISCHARGE SUMMARY    Patient ID: Katie [name],  MRN: [id number], DOB/AGE: 83/10/1931 83 y.o.  Admit date: 02/08/2019 Discharge date: 02/09/19   Electrophysiologist: Dr. Thompson Grayer   Primary Discharge Diagnosis:  Atrial flutter status post ablation this admission  Allergies  Allergen Reactions  . Amlodipine Swelling  . Macrodantin Nausea And Vomiting  . Metformin And Related Nausea And Vomiting and Other (See Comments)    Bloating  . Penicillins Rash    Did it involve swelling of the face/tongue/throat, SOB, or low BP? Unknown Did it involve sudden or severe rash/hives, skin peeling, or any reaction on the inside of your mouth or nose? Yes Did you need to seek medical attention at a hospital or doctor's office? Yes When did it last happen? 2005  If all above answers are "NO", may proceed with cephalosporin use.    Procedures This Admission: 1.  Electrophysiology study and radiofrequency catheter ablation on 02/08/2019 by Dr Rayann Heman.  This study demonstrated isthmus-dependent clockwise right atrial flutter with successful CTI ablation.  There were no inducible arrhythmias following ablation and no early apparent complications.   Brief HPI: Katie [name] is a 85 y.o. female with a past medical history as outlined above.  She has documented atrial flutter. She has been appropriately anticoagulated for >4 weeks.  Risks, benefits, and alternatives to ablation were reviewed with the patient who wished to proceed.   Hospital Course:  The patient was admitted and underwent EPS/RFCA with details as outlined above. She was monitored on telemetry overnight which demonstrated NSR 60-80s, with occasional PACs and ? Paroxysms of Afib vs AFL that quickly converts back to NSR. Groin and incision without complication.  They were examined by Dr Rayann Heman who considered them stable for discharge to home.  Follow up will be arranged in 4 weeks.  Wound care and  restrictions were reviewed with the patient prior to discharge.   Physical Exam: Vitals:   02/08/19 1500 02/08/19 1520 02/08/19 2042 02/09/19 0350  BP:  (!) 109/53 (!) 106/53 133/61  Pulse: (!) 58 60 67 75  Resp: 16 16 20 20   Temp:  (!) 97.5 F (36.4 C) 98 F (36.7 C) 98.2 F (36.8 C)  TempSrc:  Oral Oral Oral  SpO2: 100% 100% 97% 97%  Weight:    73.5 kg  Height:        GEN- The patient is well appearing, alert and oriented x 3 today.   HEENT: normocephalic, atraumatic; sclera clear, conjunctiva pink; hearing intact; oropharynx clear; neck supple, no JVP Lymph- no cervical lymphadenopathy Lungs- Clear to ausculation bilaterally, normal work of breathing.  No wheezes, rales, rhonchi Heart- Regular rate and rhythm, no murmurs, rubs or gallops, PMI not laterally displaced GI- soft, non-tender, non-distended, bowel sounds present, no hepatosplenomegaly Extremities- no clubbing, cyanosis, or edema; DP/PT/radial pulses 2+ bilaterally, groin without hematoma/bruit MS- no significant deformity or atrophy Skin- warm and dry, no rash or lesion Psych- euthymic mood, full affect Neuro- strength and sensation are intact   Labs:   Lab Results  Component Value Date   WBC 11.0 (H) 02/04/2019   HGB 13.3 02/04/2019   HCT 37.3 02/04/2019   MCV 102 (H) 02/04/2019   PLT 214 02/04/2019    Recent Labs  Lab 02/04/19 1115  NA 137  K 4.5  CL 98  CO2 23  BUN 46*  CREATININE 1.88*  CALCIUM 9.7  GLUCOSE 170*    Discharge Medications:  Allergies as of  02/09/2019      Reactions   Amlodipine Swelling   Macrodantin Nausea And Vomiting   Metformin And Related Nausea And Vomiting, Other (See Comments)   Bloating   Penicillins Rash   Did it involve swelling of the face/tongue/throat, SOB, or low BP? Unknown Did it involve sudden or severe rash/hives, skin peeling, or any reaction on the inside of your mouth or nose? Yes Did you need to seek medical attention at a hospital or doctor's  office? Yes When did it last happen? 2005 If all above answers are "NO", may proceed with cephalosporin use.      Medication List    TAKE these medications   benazepril 40 MG tablet Commonly known as: LOTENSIN Take 60 mg by mouth daily.   clonazePAM 0.5 MG tablet Commonly known as: KLONOPIN Take 1 tablet (0.5 mg total) by mouth 2 (two) times daily as needed for anxiety. What changed:   how much to take  when to take this   etodolac 400 MG tablet Commonly known as: LODINE Take 400 mg by mouth daily.   ferrous sulfate 325 (65 FE) MG tablet Take 325 mg by mouth daily with breakfast.   furosemide 40 MG tablet Commonly known as: LASIX TAKE ONE TABLET BY MOUTH IN THE MORNING DAILY. TAKE 1/2 TABLET BY MOUTH IN THE  AFTERNOON DAILY. What changed:   how much to take  how to take this  when to take this  additional instructions   levothyroxine 50 MCG tablet Commonly known as: SYNTHROID TAKE 1 TABLET BY MOUTH  DAILY BEFORE BREAKFAST What changed:   how much to take  how to take this  when to take this   loratadine 10 MG tablet Commonly known as: CLARITIN Take 10 mg by mouth daily as needed for allergies.   lovastatin 40 MG tablet Commonly known as: MEVACOR Take 2 tablets (80 mg total) by mouth at bedtime.   metoprolol tartrate 25 MG tablet Commonly known as: LOPRESSOR Take 1 tablet (25 mg total) by mouth 2 (two) times daily.   omeprazole 40 MG capsule Commonly known as: PRILOSEC Take 1 capsule (40 mg total) by mouth daily. What changed: when to take this   potassium chloride SA 20 MEQ tablet Commonly known as: K-DUR Take 1 tablet (20 mEq total) by mouth 2 (two) times daily.   raloxifene 60 MG tablet Commonly known as: EVISTA Take 1 tablet (60 mg total) by mouth daily.   Rivaroxaban 15 MG Tabs tablet Commonly known as: Xarelto TAKE 1 TABLET BY MOUTH ONCE DAILY WITH SUPPER What changed:   how much to take  how to take this  when to take this    Systane Balance 0.6 % Soln Generic drug: Propylene Glycol Place 1 drop into both eyes daily as needed (dry eyes).   traMADol 50 MG tablet Commonly known as: ULTRAM TAKE 1 TABLET BY MOUTH TWICE DAILY AS NEEDED FOR MODERATE PAIN OR SEVERE PAIN What changed:   how much to take  how to take this  when to take this  reasons to take this       Disposition:   Follow-up Information    Thompson Grayer, MD Follow up on 03/14/2019.   Specialty: Cardiology Why: at 245 for post AFL ablation follow up.  Contact information: [facility] [place] [address] [phone]           Duration of Discharge Encounter: Greater than 30 minutes including physician time.  Signed,  Shirley Friar, PA-C  02/09/2019 8:46 AM  I have seen, examined the patient, and reviewed the above assessment and plan.  Changes to above are made where necessary.  On exam, RRR.  Doing well s/p ablation.  DC to home.  Continue anticoagulation x 4 weeks.  Co Sign: Thompson Grayer, MD

## 2019-02-08 NOTE — Anesthesia Preprocedure Evaluation (Addendum)
Anesthesia Evaluation  Patient identified by MRN, date of birth, ID band Patient awake    Reviewed: Allergy & Precautions, NPO status , Patient's Chart, lab work & pertinent test results, reviewed documented beta blocker date and time   History of Anesthesia Complications (+) PONV  Airway Mallampati: I  TM Distance: >3 FB Neck ROM: Full    Dental  (+) Dental Advisory Given, Implants   Pulmonary neg pulmonary ROS,  02/04/2019 SARS coronavirus neg   breath sounds clear to auscultation       Cardiovascular hypertension, Pt. on medications and Pt. on home beta blockers (-) angina+ dysrhythmias Atrial Fibrillation  Rhythm:Irregular Rate:Normal  '17 ECHO: EF 60-65%, mild MR   Neuro/Psych negative neurological ROS     GI/Hepatic Neg liver ROS, GERD  Controlled and Medicated,  Endo/Other  diabetes (diet controlled)Hypothyroidism   Renal/GU Renal InsufficiencyRenal disease (creat 1.88)     Musculoskeletal   Abdominal   Peds  Hematology xarelto   Anesthesia Other Findings   Reproductive/Obstetrics                            Anesthesia Physical Anesthesia Plan  ASA: III  Anesthesia Plan: General   Post-op Pain Management:    Induction:   PONV Risk Score and Plan: 3 and Ondansetron, Dexamethasone and Treatment may vary due to age or medical condition  Airway Management Planned: LMA  Additional Equipment:   Intra-op Plan:   Post-operative Plan: Extubation in OR  Informed Consent: I have reviewed the patients History and Physical, chart, labs and discussed the procedure including the risks, benefits and alternatives for the proposed anesthesia with the patient or authorized representative who has indicated his/her understanding and acceptance.     Dental advisory given  Plan Discussed with: CRNA and Surgeon  Anesthesia Plan Comments:       Anesthesia Quick Evaluation

## 2019-02-09 ENCOUNTER — Encounter (HOSPITAL_COMMUNITY): Payer: Self-pay | Admitting: Internal Medicine

## 2019-02-09 DIAGNOSIS — I130 Hypertensive heart and chronic kidney disease with heart failure and stage 1 through stage 4 chronic kidney disease, or unspecified chronic kidney disease: Secondary | ICD-10-CM | POA: Diagnosis not present

## 2019-02-09 DIAGNOSIS — I483 Typical atrial flutter: Secondary | ICD-10-CM | POA: Diagnosis not present

## 2019-02-09 DIAGNOSIS — I491 Atrial premature depolarization: Secondary | ICD-10-CM | POA: Diagnosis not present

## 2019-02-09 DIAGNOSIS — Z833 Family history of diabetes mellitus: Secondary | ICD-10-CM | POA: Diagnosis not present

## 2019-02-09 DIAGNOSIS — I4892 Unspecified atrial flutter: Secondary | ICD-10-CM | POA: Diagnosis not present

## 2019-02-09 DIAGNOSIS — E7800 Pure hypercholesterolemia, unspecified: Secondary | ICD-10-CM | POA: Diagnosis not present

## 2019-02-09 DIAGNOSIS — I5032 Chronic diastolic (congestive) heart failure: Secondary | ICD-10-CM | POA: Diagnosis not present

## 2019-02-09 DIAGNOSIS — E1122 Type 2 diabetes mellitus with diabetic chronic kidney disease: Secondary | ICD-10-CM | POA: Diagnosis not present

## 2019-02-09 DIAGNOSIS — Z8249 Family history of ischemic heart disease and other diseases of the circulatory system: Secondary | ICD-10-CM | POA: Diagnosis not present

## 2019-02-09 DIAGNOSIS — Z888 Allergy status to other drugs, medicaments and biological substances status: Secondary | ICD-10-CM | POA: Diagnosis not present

## 2019-02-09 DIAGNOSIS — Z880 Allergy status to penicillin: Secondary | ICD-10-CM | POA: Diagnosis not present

## 2019-02-09 DIAGNOSIS — Z79899 Other long term (current) drug therapy: Secondary | ICD-10-CM | POA: Diagnosis not present

## 2019-02-09 DIAGNOSIS — Z7901 Long term (current) use of anticoagulants: Secondary | ICD-10-CM | POA: Diagnosis not present

## 2019-02-09 DIAGNOSIS — K219 Gastro-esophageal reflux disease without esophagitis: Secondary | ICD-10-CM | POA: Diagnosis not present

## 2019-02-09 DIAGNOSIS — E559 Vitamin D deficiency, unspecified: Secondary | ICD-10-CM | POA: Diagnosis not present

## 2019-02-09 DIAGNOSIS — N183 Chronic kidney disease, stage 3 (moderate): Secondary | ICD-10-CM | POA: Diagnosis not present

## 2019-02-09 DIAGNOSIS — Z823 Family history of stroke: Secondary | ICD-10-CM | POA: Diagnosis not present

## 2019-02-09 DIAGNOSIS — E039 Hypothyroidism, unspecified: Secondary | ICD-10-CM | POA: Diagnosis not present

## 2019-02-09 DIAGNOSIS — I4891 Unspecified atrial fibrillation: Secondary | ICD-10-CM | POA: Diagnosis not present

## 2019-02-09 LAB — GLUCOSE, CAPILLARY: Glucose-Capillary: 151 mg/dL — ABNORMAL HIGH (ref 70–99)

## 2019-02-09 MED ORDER — ALUM & MAG HYDROXIDE-SIMETH 200-200-20 MG/5ML PO SUSP
15.0000 mL | Freq: Four times a day (QID) | ORAL | Status: DC | PRN
  Filled 2019-02-09: qty 30

## 2019-02-09 NOTE — Discharge Instructions (Signed)
Groin Site Care Refer to this sheet in the next few weeks. These instructions provide you with information on caring for yourself after your procedure. Your caregiver may also give you more specific instructions. Your treatment has been planned according to current medical practices, but problems sometimes occur. Call your caregiver if you have any problems or questions after your procedure. HOME CARE INSTRUCTIONS  You may shower 24 hours after the procedure. Remove the bandage (dressing) and gently wash the site with plain soap and water. Gently pat the site dry.   Do not apply powder or lotion to the site.   Do not sit in a bathtub, swimming pool, or whirlpool for 5 to 7 days.   No bending, squatting, or lifting anything over 10 pounds (4.5 kg) as directed by your caregiver.   Inspect the site at least twice daily.   Do not drive home if you are discharged the same day of the procedure. Have someone else drive you.   You may drive 24 hours after the procedure unless otherwise instructed by your caregiver.  What to expect:  Any bruising will usually fade within 1 to 2 weeks.   Blood that collects in the tissue (hematoma) may be painful to the touch. It should usually decrease in size and tenderness within 1 to 2 weeks.  SEEK IMMEDIATE MEDICAL CARE IF:  You have unusual pain at the groin site or down the affected leg.   You have redness, warmth, swelling, or pain at the groin site.   You have drainage (other than a small amount of blood on the dressing).   You have chills.   You have a fever or persistent symptoms for more than 72 hours.   You have a fever and your symptoms suddenly get worse.   Your leg becomes pale, cool, tingly, or numb.  You have heavy bleeding from the site. Hold pressure on the site. .   Information on my medicine - XARELTO (Rivaroxaban)  This medication education was reviewed with me or my healthcare representative as part of my discharge  preparation.   Why was Xarelto prescribed for you? Xarelto was prescribed for you to reduce the risk of a blood clot forming that can cause a stroke if you have a medical condition called atrial fibrillation (a type of irregular heartbeat).  What do you need to know about xarelto ? Take your Xarelto ONCE DAILY at the same time every day with your evening meal. If you have difficulty swallowing the tablet whole, you may crush it and mix in applesauce just prior to taking your dose.  Take Xarelto exactly as prescribed by your doctor and DO NOT stop taking Xarelto without talking to the doctor who prescribed the medication.  Stopping without other stroke prevention medication to take the place of Xarelto may increase your risk of developing a clot that causes a stroke.  Refill your prescription before you run out.  After discharge, you should have regular check-up appointments with your healthcare provider that is prescribing your Xarelto.  In the future your dose may need to be changed if your kidney function or weight changes by a significant amount.  What do you do if you miss a dose? If you are taking Xarelto ONCE DAILY and you miss a dose, take it as soon as you remember on the same day then continue your regularly scheduled once daily regimen the next day. Do not take two doses of Xarelto at the same time or on  the same day.   Important Safety Information A possible side effect of Xarelto is bleeding. You should call your healthcare provider right away if you experience any of the following: ? Bleeding from an injury or your nose that does not stop. ? Unusual colored urine (red or dark brown) or unusual colored stools (red or black). ? Unusual bruising for unknown reasons. ? A serious fall or if you hit your head (even if there is no bleeding).  Some medicines may interact with Xarelto and might increase your risk of bleeding while on Xarelto. To help avoid this, consult your  healthcare provider or pharmacist prior to using any new prescription or non-prescription medications, including herbals, vitamins, non-steroidal anti-inflammatory drugs (NSAIDs) and supplements.  This website has more information on Xarelto: [email or web address]

## 2019-02-09 NOTE — Plan of Care (Signed)
  Problem: Education: Goal: Knowledge of disease or condition will improve Outcome: Completed/Met Goal: Understanding of medication regimen will improve Outcome: Completed/Met Goal: Individualized Educational Video(s) Outcome: Completed/Met   Problem: Activity: Goal: Ability to tolerate increased activity will improve Outcome: Completed/Met   Problem: Cardiac: Goal: Ability to achieve and maintain adequate cardiopulmonary perfusion will improve Outcome: Completed/Met   Problem: Health Behavior/Discharge Planning: Goal: Ability to safely manage health-related needs after discharge will improve Outcome: Completed/Met

## 2019-02-09 NOTE — Progress Notes (Signed)
Pt given education & instructions on but not limited to f/u appointments, site care, meds, driving restrictions & diet. All belongings were gathered & sent with pt. Education was verified via teacback method. All questions were answered. Hoover Brunette, RN

## 2019-02-14 NOTE — Addendum Note (Signed)
Addendum  created 02/14/19 0858 by Glynda Jaeger, CRNA   Intraprocedure Staff edited

## 2019-02-24 ENCOUNTER — Telehealth: Payer: Self-pay | Admitting: *Deleted

## 2019-02-24 NOTE — Telephone Encounter (Signed)
Prior Authorization for Clonazepam 0.5 mg-Approved  Approval good through 08/18/19  [id number]

## 2019-03-03 ENCOUNTER — Other Ambulatory Visit: Payer: Self-pay

## 2019-03-03 NOTE — Patient Outreach (Signed)
[facility]) Care Management  03/03/2019  Katie [name] Jan 16, 1932 [id number]   Medication Adherence call to [name] patient did not want to engage patient is past due on Lovastatin 40 mg under [place].   [facility] Direct Dial [phone]  Fax [phone] [name]

## 2019-03-07 ENCOUNTER — Telehealth: Payer: Self-pay

## 2019-03-07 NOTE — Telephone Encounter (Signed)
Spoke with pt regarding appt on 03/14/19. Pt stated she will have her son to help get her ready for her appt. Pt was advise to check vitals prior to appt and call me if she has any further questions.

## 2019-03-10 ENCOUNTER — Other Ambulatory Visit: Payer: Self-pay

## 2019-03-10 ENCOUNTER — Ambulatory Visit (INDEPENDENT_AMBULATORY_CARE_PROVIDER_SITE_OTHER): Payer: Medicare Other | Admitting: Specialist

## 2019-03-10 ENCOUNTER — Encounter: Payer: Self-pay | Admitting: Specialist

## 2019-03-10 VITALS — BP 185/73 | HR 51 | Ht 67.0 in | Wt 168.0 lb

## 2019-03-10 DIAGNOSIS — M25551 Pain in right hip: Secondary | ICD-10-CM

## 2019-03-10 DIAGNOSIS — M48062 Spinal stenosis, lumbar region with neurogenic claudication: Secondary | ICD-10-CM

## 2019-03-10 DIAGNOSIS — M8580 Other specified disorders of bone density and structure, unspecified site: Secondary | ICD-10-CM

## 2019-03-10 DIAGNOSIS — M1611 Unilateral primary osteoarthritis, right hip: Secondary | ICD-10-CM

## 2019-03-10 MED ORDER — TRAMADOL HCL 50 MG PO TABS: 50.0000 mg | ORAL_TABLET | Freq: Four times a day (QID) | ORAL | 0 refills | Status: DC | PRN

## 2019-03-10 NOTE — Patient Instructions (Signed)
Avoid bending, stooping and avoid lifting weights greater than 10 lbs. Avoid prolong standing and walking. Avoid frequent bending and stooping  No lifting greater than 10 lbs. May use ice or moist heat for pain. Weight loss is of benefit. Handicap license is approved. Dr. Romona Curls secretary/Assistant will call to arrange for right hip steroid injection

## 2019-03-10 NOTE — Progress Notes (Signed)
Office Visit Note   Patient: Katie [name]           Date of Birth: 01/20/1932           MRN: [id number] Visit Date: 03/10/2019              Requested by: [name], [address] [place],  [place] [address] PCP: [name], FNP   Assessment & Plan: Visit Diagnoses:  1. Spinal stenosis of lumbar region with neurogenic claudication   2. Pain of right hip joint   3. Unilateral primary osteoarthritis, right hip   4. Osteopenia, senile     Plan:Avoid bending, stooping and avoid lifting weights greater than 10 lbs. Avoid prolong standing and walking. Avoid frequent bending and stooping  No lifting greater than 10 lbs. May use ice or moist heat for pain. Weight loss is of benefit. Handicap license is approved. Dr. Romona Curls secretary/Assistant will call to arrange for right hip steroid injection   Follow-Up Instructions: Return in about 4 weeks (around 04/07/2019).   Orders:  Orders Placed This Encounter  Procedures  . Ambulatory referral to Physical Medicine Rehab   Meds ordered this encounter  Medications  . DISCONTD: traMADol (ULTRAM) 50 MG tablet    Sig: Take 1 tablet (50 mg total) by mouth every 6 (six) hours as needed for up to 7 days. TAKE 1 TABLET BY MOUTH TWICE DAILY AS NEEDED FOR MODERATE PAIN OR SEVERE PAIN    Dispense:  40 tablet    Refill:  0    Do not fill till 11/22/18  . traMADol (ULTRAM) 50 MG tablet    Sig: Take 1 tablet (50 mg total) by mouth every 6 (six) hours as needed for up to 7 days for moderate pain. TAKE 1 TABLET BY MOUTH TWICE DAILY AS NEEDED FOR MODERATE PAIN OR SEVERE PAIN    Dispense:  28 tablet    Refill:  0    Do not fill till 11/22/18      Procedures: No procedures performed   Clinical Data: No additional findings.   Subjective: Chief Complaint  Patient presents with  . Lower Back - Follow-up    She had Right L4 TF injection on 02/02/2019 with Dr. Ernestina Patches and she states that she did not get any relief  with it.    85 year old female with history of back pain and radiation into the right posterior buttock and down the left lateral thigh and lateral calf into the right lateral ankle. She has no bowel or bladder difficulty. She has pain with standing and walking and can walk to the mailbox about 100' and is grocery shopping and pushes a grocery cart to help her with balance, does not usually stoop. The right leg does not give away and she has not noticed weakness. Sitting  Doesn't improve the pain and when she gets comfortable lying down at night she is able to sleep.    Review of Systems  Constitutional: Negative.  Negative for activity change, appetite change, chills, diaphoresis, fatigue, fever and unexpected weight change.  HENT: Negative.   Eyes: Negative.   Respiratory: Negative for apnea, cough, choking, chest tightness, shortness of breath, wheezing and stridor.   Cardiovascular: Positive for palpitations (recent hospitalization for arrythmia, had surgical ablation). Negative for chest pain and leg swelling.  Gastrointestinal: Negative.  Negative for abdominal distention, abdominal pain, anal bleeding, blood in stool, constipation, diarrhea, nausea, rectal pain and vomiting.  Endocrine: Negative for cold intolerance  and heat intolerance.  Musculoskeletal: Positive for back pain and gait problem. Negative for joint swelling, myalgias, neck pain and neck stiffness.  Skin: Negative.   Allergic/Immunologic: Negative for environmental allergies, food allergies and immunocompromised state.  Neurological: Positive for weakness and numbness. Negative for dizziness, tremors, seizures, syncope, facial asymmetry, speech difficulty, light-headedness and headaches.  Hematological: Negative for adenopathy. Does not bruise/bleed easily.  Psychiatric/Behavioral: Negative for agitation, behavioral problems, confusion, decreased concentration, dysphoric mood, hallucinations, self-injury, sleep disturbance  and suicidal ideas. The patient is not nervous/anxious and is not hyperactive.      Objective: Vital Signs: BP (!) 185/73 (BP Location: Left Arm, Patient Position: Sitting)   Pulse (!) 51   Ht 5\' 7"  (1.702 m)   Wt 168 lb (76.2 kg)   BMI 26.31 kg/m   Physical Exam Constitutional:      General: She is not in acute distress.    Appearance: Normal appearance. She is obese.  HENT:     Head: Normocephalic.     Nose: Nose normal.     Mouth/Throat:     Mouth: Mucous membranes are dry.  Eyes:     Pupils: Pupils are equal, round, and reactive to light.  Neck:     Musculoskeletal: Normal range of motion and neck supple.  Cardiovascular:     Rate and Rhythm: Normal rate.     Pulses: Normal pulses.  Pulmonary:     Effort: Pulmonary effort is normal.  Abdominal:     General: Abdomen is flat.  Musculoskeletal:        General: Deformity present.     Right lower leg: No edema.     Left lower leg: No edema.  Skin:    General: Skin is warm and dry.  Neurological:     General: No focal deficit present.     Mental Status: She is oriented to person, place, and time.  Psychiatric:        Mood and Affect: Mood normal.        Behavior: Behavior normal.        Thought Content: Thought content normal.        Judgment: Judgment normal.     Right Hip Exam   Tenderness  The patient is experiencing tenderness in the greater trochanter.  Range of Motion  Abduction: normal  Adduction: normal  Extension: normal  Flexion: normal  External rotation: normal  Internal rotation: normal   Muscle Strength  Abduction: 4/5  Adduction: 4/5  Flexion: 4/5   Tests  FABER: positive Ober: positive  Other  Erythema: absent Scars: absent Sensation: normal Pulse: present   Back Exam   Tenderness  The patient is experiencing tenderness in the lumbar.  Range of Motion  Extension: normal  Flexion: normal  Lateral bend right: abnormal  Lateral bend left: abnormal  Rotation right:  abnormal  Rotation left: abnormal   Muscle Strength  Right Quadriceps:  5/5  Left Quadriceps:  5/5  Right Hamstrings:  5/5  Left Hamstrings:  5/5   Tests  Straight leg raise right: negative Straight leg raise left: negative  Reflexes  Patellar: 2/4 Achilles: 2/4 Babinski's sign: normal   Other  Toe walk: normal Heel walk: normal Sensation: normal Gait: normal  Erythema: no back redness Scars: absent      Specialty Comments:  No specialty comments available.  Imaging: No results found.   PMFS History: Patient Active Problem List   Diagnosis Date Noted  . Atrial flutter ([place]) 02/08/2019  .  Back pain 01/24/2019  . Chronic midline low back pain without sciatica 10/22/2018  . Primary insomnia 10/22/2018  . Persistent atrial fibrillation 04/27/2017  . Chronic diastolic CHF (congestive heart failure) ([place]) 04/27/2017  . CKD (chronic kidney disease) stage 3, GFR 30-59 ml/min (H[facility]) 05/14/2015  . BMI 26.0-26.9,adult 05/11/2015  . Peripheral edema 07/06/2014  . Hypokalemia 07/22/2013  . Hypothyroidism 07/22/2013  . Osteopenia, senile 03/09/2013  . Constipation 01/04/2013  . Hypertension 05/07/2012  . Hyperlipidemia 05/07/2012  . Diabetes mellitus type 2, diet-controlled ([place]) 05/07/2012  . GERD (gastroesophageal reflux disease) 05/07/2012   Past Medical History:  Diagnosis Date  . Aortic insufficiency    a. Trivial AI by echo 02/2016.  [name] Atrial fibrillation and flutter ([place])    a. Coarse afib vs flutter by EKG 12/2015.  . Cataract   . Chronic diastolic CHF (congestive heart failure) ([place])   . CKD (chronic kidney disease), stage III ([place])   . Edema   . GERD (gastroesophageal reflux disease)   . Hiatal hernia   . Hypercholesterolemia   . Hypertension   . Hypokalemia   . Hypothyroidism   . Left knee pain   . NIDDM (non-insulin dependent diabetes mellitus)    diet controlled   . Obesity   . Premature atrial contractions   . PVC's (premature ventricular  contractions)   . URI (upper respiratory infection)   . Vitamin D deficiency     Family History  Problem Relation Age of Onset  . Diabetes Mother   . Stroke Mother   . Heart disease Father   . Stroke Father   . Uterine cancer Sister   . Diabetes Sister   . Ovarian cancer Sister   . Colon cancer Sister   . Diabetes Sister   . Liver cancer Sister        \  . Diabetes Brother   . Dementia Brother   . Atrial fibrillation Sister   . Diabetes Sister   . Diabetes Son   . Heart attack Neg Hx   . Hypertension Neg Hx     Past Surgical History:  Procedure Laterality Date  . A-FLUTTER ABLATION N/A 02/08/2019   Procedure: A-FLUTTER ABLATION;  Surgeon: Thompson Grayer, MD;  Location: [facility];  Service: Cardiovascular;  Laterality: N/A;  . ABDOMINAL HYSTERECTOMY    . APPENDECTOMY  1980  . BACK SURGERY    . CATARACT EXTRACTION, BILATERAL    . CHOLECYSTECTOMY  5/00  . COLONOSCOPY    . TONSILLECTOMY    . TOTAL ABDOMINAL HYSTERECTOMY W/ BILATERAL SALPINGOOPHORECTOMY  1980  . UPPER GASTROINTESTINAL ENDOSCOPY     Social History   Occupational History  . Occupation: Retired  Tobacco Use  . Smoking status: Never Smoker  . Smokeless tobacco: Never Used  Substance and Sexual Activity  . Alcohol use: No  . Drug use: No  . Sexual activity: Not Currently

## 2019-03-14 ENCOUNTER — Telehealth (INDEPENDENT_AMBULATORY_CARE_PROVIDER_SITE_OTHER): Payer: Medicare Other | Admitting: Internal Medicine

## 2019-03-14 ENCOUNTER — Other Ambulatory Visit: Payer: Self-pay

## 2019-03-14 DIAGNOSIS — R001 Bradycardia, unspecified: Secondary | ICD-10-CM | POA: Diagnosis not present

## 2019-03-14 DIAGNOSIS — Z7901 Long term (current) use of anticoagulants: Secondary | ICD-10-CM

## 2019-03-14 DIAGNOSIS — I495 Sick sinus syndrome: Secondary | ICD-10-CM

## 2019-03-14 DIAGNOSIS — I4892 Unspecified atrial flutter: Secondary | ICD-10-CM

## 2019-03-14 DIAGNOSIS — I110 Hypertensive heart disease with heart failure: Secondary | ICD-10-CM

## 2019-03-14 DIAGNOSIS — I5032 Chronic diastolic (congestive) heart failure: Secondary | ICD-10-CM | POA: Diagnosis not present

## 2019-03-14 NOTE — Telephone Encounter (Signed)
-----   Message from Thompson Grayer, MD sent at 03/14/2019  3:02 PM EDT ----- Please schedule for in office loop recorder placement Follow-up with amber 4 weeks after implant  Stop metoprolol today.

## 2019-03-14 NOTE — Telephone Encounter (Signed)
Left message for Pt.  Offered April 06, 2019 at 10:00 am for loop implant with Dr. Rayann Heman.  Left message to call back.

## 2019-03-14 NOTE — Progress Notes (Signed)
Electrophysiology TeleHealth Note   Due to national recommendations of social distancing due to COVID 19, an audio/video telehealth visit is felt to be most appropriate for this patient at this time.  See MyChart message from today for the patient's consent to telehealth for [facility].   Date:  03/14/2019   ID:  Katie [name], DOB 01-10-1932, MRN [id number]  Location: patient's home  Provider location:  [facility]  Evaluation Performed: Follow-up visit  PCP:  [name], FNP   Electrophysiologist:  Dr Rayann Heman  Chief Complaint:  palpitations  History of Present Illness:    Katie [name] is a 85 y.o. female who presents via telehealth conferencing today.  Since her ablation the patient reports doing very well.  No symptoms of arrhythmia.  Today, she denies symptoms of palpitations, chest pain, shortness of breath,  lower extremity edema, dizziness, presyncope, or syncope.  Her primary concern is with back/ hip.  She has been taken off of NSAIDs due to xarelto and has had increased discomfort  The patient is otherwise without complaint today.  The patient denies symptoms of fevers, chills, cough, or new SOB worrisome for COVID 19.  Past Medical History:  Diagnosis Date  . Aortic insufficiency    a. Trivial AI by echo 02/2016.  [name] Atrial fibrillation and flutter ([place])    a. Coarse afib vs flutter by EKG 12/2015.  . Cataract   . Chronic diastolic CHF (congestive heart failure) ([place])   . CKD (chronic kidney disease), stage III ([place])   . Edema   . GERD (gastroesophageal reflux disease)   . Hiatal hernia   . Hypercholesterolemia   . Hypertension   . Hypokalemia   . Hypothyroidism   . Left knee pain   . NIDDM (non-insulin dependent diabetes mellitus)    diet controlled   . Obesity   . Premature atrial contractions   . PVC's (premature ventricular contractions)   . URI (upper respiratory infection)   . Vitamin D deficiency     Past Surgical History:   Procedure Laterality Date  . A-FLUTTER ABLATION N/A 02/08/2019   Procedure: A-FLUTTER ABLATION;  Surgeon: Thompson Grayer, MD;  Location: [facility];  Service: Cardiovascular;  Laterality: N/A;  . ABDOMINAL HYSTERECTOMY    . APPENDECTOMY  1980  . BACK SURGERY    . CATARACT EXTRACTION, BILATERAL    . CHOLECYSTECTOMY  5/00  . COLONOSCOPY    . TONSILLECTOMY    . TOTAL ABDOMINAL HYSTERECTOMY W/ BILATERAL SALPINGOOPHORECTOMY  1980  . UPPER GASTROINTESTINAL ENDOSCOPY      Current Outpatient Medications  Medication Sig Dispense Refill  . benazepril (LOTENSIN) 40 MG tablet Take 60 mg by mouth daily.     . clonazePAM (KLONOPIN) 0.5 MG tablet Take 1 tablet (0.5 mg total) by mouth 2 (two) times daily as needed for anxiety. (Patient taking differently: Take 0.25 mg by mouth at bedtime. ) 30 tablet 1  . ferrous sulfate 325 (65 FE) MG tablet Take 325 mg by mouth daily with breakfast.    . furosemide (LASIX) 40 MG tablet TAKE ONE TABLET BY MOUTH IN THE MORNING DAILY. TAKE 1/2 TABLET BY MOUTH IN THE  AFTERNOON DAILY. (Patient taking differently: Take 40 mg by mouth 2 (two) times daily. ) 135 tablet 1  . levothyroxine (SYNTHROID, LEVOTHROID) 50 MCG tablet TAKE 1 TABLET BY MOUTH  DAILY BEFORE BREAKFAST (Patient taking differently: Take 50 mcg by mouth daily before breakfast. TAKE 1 TABLET BY MOUTH  DAILY BEFORE BREAKFAST) 90 tablet 1  . loratadine (CLARITIN) 10 MG tablet Take 10 mg by mouth daily as needed for allergies.    [name] lovastatin (MEVACOR) 40 MG tablet Take 2 tablets (80 mg total) by mouth at bedtime. 180 tablet 1  . metoprolol tartrate (LOPRESSOR) 25 MG tablet Take 1 tablet (25 mg total) by mouth 2 (two) times daily. 180 tablet 1  . omeprazole (PRILOSEC) 40 MG capsule Take 1 capsule (40 mg total) by mouth daily. (Patient taking differently: Take 40 mg by mouth daily before breakfast. ) 90 capsule 1  . potassium chloride SA (K-DUR,KLOR-CON) 20 MEQ tablet Take 1 tablet (20 mEq total) by mouth 2  (two) times daily. 180 tablet 1  . Propylene Glycol (SYSTANE BALANCE) 0.6 % SOLN Place 1 drop into both eyes daily as needed (dry eyes).    . raloxifene (EVISTA) 60 MG tablet Take 1 tablet (60 mg total) by mouth daily. 90 tablet 1  . Rivaroxaban (XARELTO) 15 MG TABS tablet TAKE 1 TABLET BY MOUTH ONCE DAILY WITH SUPPER (Patient taking differently: Take 15 mg by mouth daily with supper. TAKE 1 TABLET BY MOUTH ONCE DAILY WITH SUPPER) 90 tablet 1  . traMADol (ULTRAM) 50 MG tablet Take 1 tablet (50 mg total) by mouth every 6 (six) hours as needed for up to 7 days for moderate pain. TAKE 1 TABLET BY MOUTH TWICE DAILY AS NEEDED FOR MODERATE PAIN OR SEVERE PAIN 28 tablet 0   No current facility-administered medications for this visit.     Allergies:   Amlodipine, Macrodantin, Metformin and related, and Penicillins   Social History:  The patient  reports that she has never smoked. She has never used smokeless tobacco. She reports that she does not drink alcohol or use drugs.   Family History:  The patient's  family history includes Atrial fibrillation in her sister; Colon cancer in her sister; Dementia in her brother; Diabetes in her brother, mother, sister, sister, sister, and son; Heart disease in her father; Liver cancer in her sister; Ovarian cancer in her sister; Stroke in her father and mother; Uterine cancer in her sister.   ROS:  Please see the history of present illness.   All other systems are personally reviewed and negative.    Exam:    Vital Signs:  There were no vitals taken for this visit.  Well appearing today, NAD, OP clear, normal WOB   Labs/Other Tests and Data Reviewed:    Recent Labs: 01/28/2019: ALT 15; B Natriuretic Peptide 627.0; Magnesium 2.0; TSH 2.659 02/04/2019: BUN 46; Creatinine, Ser 1.88; Hemoglobin 13.3; Platelets 214; Potassium 4.5; Sodium 137   Wt Readings from Last 3 Encounters:  03/10/19 168 lb (76.2 kg)  02/09/19 162 lb 1.6 oz (73.5 kg)  01/31/19 166 lb  (75.3 kg)       ASSESSMENT & PLAN:    1.  Atrial flutter Resolved s/p ablation chads2vasc score is 5.  Given prior history of AF (though not well documented), I am reluctant to stop xarelto at this time.  Given symptoms of arthritis, she would like to restart NSAIDs and stop xarelto in the future. We discussed long term monitoring as the appropriate next to evaluate her arrhythmia and determine if further anticoagulation is required. Risks and benefits to ILR were discussed with her and family today and they wish to proceed. We will therefore plan to place ILR in the office at the next available time and follow-up with EP APP 4 weeks later.  If no AF at that time, we will stop xarelto and only resume should arrhythmias return.  Stop metoprolol today  2. Sinus bradycardia Asymptomatic Stop metoprolol  3. HTN Stable No change required today  4. Chronic diastolic dysfunction Stable No change required today   Follow-up:  As above  Patient Risk:  after full review of this patients clinical status, I feel that they are at moderate risk at this time.  Today, I have spent 15 minutes with the patient with telehealth technology discussing arrhythmia management .    SignedThompson Grayer, MD  03/14/2019 2:55 PM     [facility] [address] [place] [address] [phone] (office) [phone] (fax)

## 2019-03-14 NOTE — Telephone Encounter (Signed)
Call back received from Pt.  Pt agreeable to appt on April 06, 2019 at 1000 am.  Message sent to precert.

## 2019-03-15 ENCOUNTER — Encounter: Payer: Self-pay | Admitting: Nurse Practitioner

## 2019-03-15 ENCOUNTER — Ambulatory Visit (INDEPENDENT_AMBULATORY_CARE_PROVIDER_SITE_OTHER): Payer: Medicare Other | Admitting: Nurse Practitioner

## 2019-03-15 VITALS — BP 179/82 | HR 80 | Temp 96.9°F | Ht 67.0 in | Wt 168.0 lb

## 2019-03-15 DIAGNOSIS — K219 Gastro-esophageal reflux disease without esophagitis: Secondary | ICD-10-CM

## 2019-03-15 DIAGNOSIS — F5101 Primary insomnia: Secondary | ICD-10-CM

## 2019-03-15 DIAGNOSIS — E876 Hypokalemia: Secondary | ICD-10-CM | POA: Diagnosis not present

## 2019-03-15 DIAGNOSIS — E032 Hypothyroidism due to medicaments and other exogenous substances: Secondary | ICD-10-CM | POA: Diagnosis not present

## 2019-03-15 DIAGNOSIS — E782 Mixed hyperlipidemia: Secondary | ICD-10-CM

## 2019-03-15 DIAGNOSIS — R600 Localized edema: Secondary | ICD-10-CM

## 2019-03-15 DIAGNOSIS — R55 Syncope and collapse: Secondary | ICD-10-CM

## 2019-03-15 DIAGNOSIS — I10 Essential (primary) hypertension: Secondary | ICD-10-CM

## 2019-03-15 DIAGNOSIS — R609 Edema, unspecified: Secondary | ICD-10-CM

## 2019-03-15 MED ORDER — LEVOTHYROXINE SODIUM 50 MCG PO TABS: ORAL_TABLET | ORAL | 1 refills | Status: DC

## 2019-03-15 MED ORDER — LOVASTATIN 40 MG PO TABS: 80.0000 mg | ORAL_TABLET | Freq: Every day | ORAL | 1 refills | Status: DC

## 2019-03-15 MED ORDER — POTASSIUM CHLORIDE CRYS ER 20 MEQ PO TBCR: 20.0000 meq | EXTENDED_RELEASE_TABLET | Freq: Two times a day (BID) | ORAL | 1 refills | Status: DC

## 2019-03-15 MED ORDER — CLONAZEPAM 0.5 MG PO TABS: 0.5000 mg | ORAL_TABLET | Freq: Two times a day (BID) | ORAL | 1 refills | Status: DC | PRN

## 2019-03-15 MED ORDER — OMEPRAZOLE 40 MG PO CPDR: 40.0000 mg | DELAYED_RELEASE_CAPSULE | Freq: Every day | ORAL | 1 refills | Status: DC

## 2019-03-15 MED ORDER — BENAZEPRIL HCL 40 MG PO TABS: 60.0000 mg | ORAL_TABLET | Freq: Every day | ORAL | 1 refills | Status: DC

## 2019-03-15 MED ORDER — CLONIDINE HCL 0.1 MG PO TABS: 0.1000 mg | ORAL_TABLET | Freq: Two times a day (BID) | ORAL | 1 refills | Status: DC

## 2019-03-15 MED ORDER — FUROSEMIDE 40 MG PO TABS: ORAL_TABLET | ORAL | 1 refills | Status: DC

## 2019-03-15 NOTE — Progress Notes (Signed)
Subjective:    Patient ID: Katie [name], female    DOB: [birth year], 85 y.o.   MRN: [id number]   Chief Complaint: Almost passed out at dentist   HPI Patient comes in today because she almost passed out at dentist office last month. So  Now they will not do anything or see her until she is seen by PCP. Patient had to go to the ER on 01/28/19 with palpitations and increased heart rate. She was diagnosed with atrial flutter with rapid heart rate. They tried giving her cardizem which dropped heart rate down to 30's. So they decided to do cardioversion with success. Since then she has had to have an ablation which was successful. She had video visit with cardiology yesterday.    Review of Systems  Constitutional: Negative for activity change and appetite change.  HENT: Negative.   Eyes: Negative for pain.  Respiratory: Negative for shortness of breath.   Cardiovascular: Negative for chest pain, palpitations and leg swelling.  Gastrointestinal: Negative for abdominal pain.  Endocrine: Negative for polydipsia.  Genitourinary: Negative.   Skin: Negative for rash.  Neurological: Negative for dizziness, weakness and headaches.  Hematological: Does not bruise/bleed easily.  Psychiatric/Behavioral: Negative.   All other systems reviewed and are negative.      Objective:   Physical Exam Vitals signs and nursing note reviewed.  Constitutional:      Appearance: Normal appearance. She is normal weight.  Cardiovascular:     Rate and Rhythm: Normal rate and regular rhythm.     Pulses: Normal pulses.     Heart sounds: Normal heart sounds.  Pulmonary:     Effort: Pulmonary effort is normal.     Breath sounds: Normal breath sounds.  Skin:    General: Skin is warm and dry.  Neurological:     General: No focal deficit present.     Mental Status: She is alert and oriented to person, place, and time.    BP (!) 179/82   Pulse 80   Temp (!) 96.9 F (36.1 C) (Oral)   Ht 5\' 7"  (1.702 m)    Wt 168 lb (76.2 kg)   BMI 26.31 kg/m    EKG- Kerry Hough, FNP       Assessment & Plan:  [name] in today with chief complaint of Almost passed out at dentist   1. Syncope, unspecified syncope type - EKG 12-Lead  2. Gastroesophageal reflux disease without esophagitis - omeprazole (PRILOSEC) 40 MG capsule; Take 1 capsule (40 mg total) by mouth daily.  Dispense: 90 capsule; Refill: 1  3. Hypokalemia - potassium chloride SA (K-DUR) 20 MEQ tablet; Take 1 tablet (20 mEq total) by mouth 2 (two) times daily.  Dispense: 180 tablet; Refill: 1  4. Hypothyroidism due to non-medication exogenous substances - levothyroxine (SYNTHROID) 50 MCG tablet; TAKE 1 TABLET BY MOUTH  DAILY BEFORE BREAKFAST  Dispense: 90 tablet; Refill: 1  5. Mixed hyperlipidemia - lovastatin (MEVACOR) 40 MG tablet; Take 2 tablets (80 mg total) by mouth at bedtime.  Dispense: 180 tablet; Refill: 1  6. Peripheral edema - furosemide (LASIX) 40 MG tablet; TAKE ONE TABLET BY MOUTH IN THE MORNING DAILY. TAKE 1/2 TABLET BY MOUTH IN THE  AFTERNOON DAILY.  Dispense: 135 tablet; Refill: 1  7. Primary insomnia - clonazePAM (KLONOPIN) 0.5 MG tablet; Take 1 tablet (0.5 mg total) by mouth 2 (two) times daily as needed for anxiety.  Dispense: 30 tablet; Refill: 1  8. Essential hypertension Added clonidine  today - benazepril (LOTENSIN) 40 MG tablet; Take 1.5 tablets (60 mg total) by mouth daily.  Dispense: 90 tablet; Refill: 1 - cloNIDine (CATAPRES) 0.1 MG tablet; Take 1 tablet (0.1 mg total) by mouth 2 (two) times daily.  Dispense: 180 tablet; Refill: 1   Follow up in 3 months [place], FNP

## 2019-03-16 DIAGNOSIS — L821 Other seborrheic keratosis: Secondary | ICD-10-CM | POA: Diagnosis not present

## 2019-03-16 DIAGNOSIS — L820 Inflamed seborrheic keratosis: Secondary | ICD-10-CM | POA: Diagnosis not present

## 2019-03-16 DIAGNOSIS — L570 Actinic keratosis: Secondary | ICD-10-CM | POA: Diagnosis not present

## 2019-03-16 DIAGNOSIS — D225 Melanocytic nevi of trunk: Secondary | ICD-10-CM | POA: Diagnosis not present

## 2019-03-16 DIAGNOSIS — D1801 Hemangioma of skin and subcutaneous tissue: Secondary | ICD-10-CM | POA: Diagnosis not present

## 2019-03-21 NOTE — Procedures (Signed)
Lumbosacral Transforaminal Epidural Steroid Injection - Sub-Pedicular Approach with Fluoroscopic Guidance  Patient: Katie [name]      Date of Birth: 23-Apr-1932 MRN: [id number] PCP: [name], FNP      Visit Date: 02/02/2019   Universal Protocol:    Date/Time: 02/02/2019  Consent Given By: the patient  Position: PRONE  Additional Comments: Vital signs were monitored before and after the procedure. Patient was prepped and draped in the usual sterile fashion. The correct patient, procedure, and site was verified.   Injection Procedure Details:  Procedure Site One Meds Administered:  Meds ordered this encounter  Medications  . betamethasone acetate-betamethasone sodium phosphate (CELESTONE) injection 12 mg    Laterality: Right  Location/Site:  L4-L5 L5-S1  Needle size: 22 G  Needle type: Spinal  Needle Placement: Transforaminal  Findings:    -Comments: Excellent flow of contrast along the nerve and into the epidural space.  Procedure Details: After squaring off the end-plates to get a true AP view, the C-arm was positioned so that an oblique view of the foramen as noted above was visualized. The target area is just inferior to the "nose of the scotty dog" or sub pedicular. The soft tissues overlying this structure were infiltrated with 2-3 ml. of 1% Lidocaine without Epinephrine.  The spinal needle was inserted toward the target using a "trajectory" view along the fluoroscope beam.  Under AP and lateral visualization, the needle was advanced so it did not puncture dura and was located close the 6 O'Clock position of the pedical in AP tracterory. Biplanar projections were used to confirm position. Aspiration was confirmed to be negative for CSF and/or blood. A 1-2 ml. volume of Isovue-250 was injected and flow of contrast was noted at each level. Radiographs were obtained for documentation purposes.   After attaining the desired flow of contrast documented  above, a 0.5 to 1.0 ml test dose of 0.25% Marcaine was injected into each respective transforaminal space.  The patient was observed for 90 seconds post injection.  After no sensory deficits were reported, and normal lower extremity motor function was noted,   the above injectate was administered so that equal amounts of the injectate were placed at each foramen (level) into the transforaminal epidural space.   Additional Comments:  No complications occurred Dressing: 2 x 2 sterile gauze and Band-Aid    Post-procedure details: Patient was observed during the procedure. Post-procedure instructions were reviewed.  Patient left the clinic in stable condition.

## 2019-03-21 NOTE — Progress Notes (Signed)
Katie [name] - 85 y.o. female MRN [id number]  Date of birth: 07-Oct-1931  Office Visit Note: Visit Date: 02/02/2019 PCP: [name], FNP Referred by: Hassell Done, Mary-Margaret, *  Subjective: Chief Complaint  Patient presents with  . Lower Back - Pain  . Right Leg - Pain   HPI:  Katie [name] is a 85 y.o. female who comes in today At the request of Dr. Basil Dess for right L4 and L5 transforaminal epidural steroid injection for right hip and leg pain.  Patient is very hard of hearing but reports chronic worsening severe right hip and leg pain from the buttocks down the right leg and a pretty classic L5 distribution.  This started in May of this year.  No specific injury.  Pain is 7 out of 10.  Not really relieved with current conservative care.  ROS Otherwise per HPI.  Assessment & Plan: Visit Diagnoses:  1. Lumbar radiculopathy     Plan: No additional findings.   Meds & Orders:  Meds ordered this encounter  Medications  . betamethasone acetate-betamethasone sodium phosphate (CELESTONE) injection 12 mg    Orders Placed This Encounter  Procedures  . XR C-ARM NO REPORT  . Epidural Steroid injection    Follow-up: No follow-ups on file.   Procedures: No procedures performed  Lumbosacral Transforaminal Epidural Steroid Injection - Sub-Pedicular Approach with Fluoroscopic Guidance  Patient: Katie [name]      Date of Birth: 1932-05-15 MRN: [id number] PCP: [name], FNP      Visit Date: 02/02/2019   Universal Protocol:    Date/Time: 02/02/2019  Consent Given By: the patient  Position: PRONE  Additional Comments: Vital signs were monitored before and after the procedure. Patient was prepped and draped in the usual sterile fashion. The correct patient, procedure, and site was verified.   Injection Procedure Details:  Procedure Site One Meds Administered:  Meds ordered this encounter  Medications  . betamethasone acetate-betamethasone sodium  phosphate (CELESTONE) injection 12 mg    Laterality: Right  Location/Site:  L4-L5 L5-S1  Needle size: 22 G  Needle type: Spinal  Needle Placement: Transforaminal  Findings:    -Comments: Excellent flow of contrast along the nerve and into the epidural space.  Procedure Details: After squaring off the end-plates to get a true AP view, the C-arm was positioned so that an oblique view of the foramen as noted above was visualized. The target area is just inferior to the "nose of the scotty dog" or sub pedicular. The soft tissues overlying this structure were infiltrated with 2-3 ml. of 1% Lidocaine without Epinephrine.  The spinal needle was inserted toward the target using a "trajectory" view along the fluoroscope beam.  Under AP and lateral visualization, the needle was advanced so it did not puncture dura and was located close the 6 O'Clock position of the pedical in AP tracterory. Biplanar projections were used to confirm position. Aspiration was confirmed to be negative for CSF and/or blood. A 1-2 ml. volume of Isovue-250 was injected and flow of contrast was noted at each level. Radiographs were obtained for documentation purposes.   After attaining the desired flow of contrast documented above, a 0.5 to 1.0 ml test dose of 0.25% Marcaine was injected into each respective transforaminal space.  The patient was observed for 90 seconds post injection.  After no sensory deficits were reported, and normal lower extremity motor function was noted,   the above injectate was administered so that equal amounts of the injectate  were placed at each foramen (level) into the transforaminal epidural space.   Additional Comments:  No complications occurred Dressing: 2 x 2 sterile gauze and Band-Aid    Post-procedure details: Patient was observed during the procedure. Post-procedure instructions were reviewed.  Patient left the clinic in stable condition.    Clinical History: No specialty  comments available.     Objective:  VS:  HT:    WT:   BMI:     BP:(!) 131/91  HR:(!) 131bpm  TEMP: ( )  RESP:  Physical Exam  Ortho Exam Imaging: No results found.

## 2019-03-25 ENCOUNTER — Telehealth: Payer: Self-pay | Admitting: *Deleted

## 2019-03-25 ENCOUNTER — Ambulatory Visit: Payer: Self-pay

## 2019-03-25 ENCOUNTER — Encounter: Payer: Self-pay | Admitting: Physical Medicine and Rehabilitation

## 2019-03-25 ENCOUNTER — Ambulatory Visit (INDEPENDENT_AMBULATORY_CARE_PROVIDER_SITE_OTHER): Payer: Medicare Other | Admitting: Physical Medicine and Rehabilitation

## 2019-03-25 DIAGNOSIS — M25551 Pain in right hip: Secondary | ICD-10-CM | POA: Diagnosis not present

## 2019-03-25 NOTE — Telephone Encounter (Signed)
Please advise on message below. Thank you!

## 2019-03-25 NOTE — Progress Notes (Signed)
   Katie [name] - 85 y.o. female MRN [id number]  Date of birth: 1932-08-06  Office Visit Note: Visit Date: 03/25/2019 PCP: [name], FNP Referred by: Hassell Done, Mary-Margaret, *  Subjective: Chief Complaint  Patient presents with  . Right Hip - Pain   HPI:  Katie [name] is a 85 y.o. female who comes in today For planned right intra-articular hip injection as directed by Dr. Basil Dess.  Patient is having right posterior hip pain but does have worsening with movement in and out of the car and with weightbearing.  She does have a little bit of pain anterior laterally but not specific groin pain.  When we talked about doing the injection where the hip joint is in the front she cannot grasp why the injection would be in the front when her pain is in the back.  I tried explaining this several times but she really did not seem to get the idea that the injection was to put medication in the hip joint and if the hip joint was the source of pain this could help the pain in the posterior part as well as anywhere else if that is the source.  When she left she did get some relief but not much relief during the anesthetic phase of the injection.  She is a difficult historian.  ROS Otherwise per HPI.  Assessment & Plan: Visit Diagnoses: No diagnosis found.  Plan: No additional findings.   Meds & Orders: No orders of the defined types were placed in this encounter.  No orders of the defined types were placed in this encounter.   Follow-up: No follow-ups on file.   Procedures: Large Joint Inj: R hip joint on 03/25/2019 9:08 AM Indications: pain and diagnostic evaluation Details: 22 G needle, anterior approach  Arthrogram: Yes  Medications: 80 mg triamcinolone acetonide 40 MG/ML; 4 mL bupivacaine 0.25 % Outcome: tolerated well, no immediate complications  Arthrogram demonstrated excellent flow of contrast throughout the joint surface without extravasation or obvious defect.  The patient  had relief of symptoms during the anesthetic phase of the injection.  Procedure, treatment alternatives, risks and benefits explained, specific risks discussed. Consent was given by the patient. Immediately prior to procedure a time out was called to verify the correct patient, procedure, equipment, support staff and site/side marked as required. Patient was prepped and draped in the usual sterile fashion.      No notes on file   Clinical History: No specialty comments available.     Objective:  VS:  HT:    WT:   BMI:     BP:   HR: bpm  TEMP: ( )  RESP:  Physical Exam  Ortho Exam Imaging: No results found.

## 2019-03-25 NOTE — Progress Notes (Signed)
 .  Numeric Pain Rating Scale and Functional Assessment Average Pain 7   In the last MONTH (on 0-10 scale) has pain interfered with the following?  1. General activity like being  able to carry out your everyday physical activities such as walking, climbing stairs, carrying groceries, or moving a chair?  Rating(6)   -Dye Allergies.

## 2019-03-28 MED ORDER — TRIAMCINOLONE ACETONIDE 40 MG/ML IJ SUSP
80.0000 mg | INTRAMUSCULAR | Status: AC | PRN
  Administered 2019-03-25: 80 mg via INTRA_ARTICULAR

## 2019-03-28 MED ORDER — BUPIVACAINE HCL 0.25 % IJ SOLN
4.0000 mL | INTRAMUSCULAR | Status: AC | PRN
  Administered 2019-03-25: 4 mL via INTRA_ARTICULAR

## 2019-03-30 ENCOUNTER — Ambulatory Visit (INDEPENDENT_AMBULATORY_CARE_PROVIDER_SITE_OTHER): Payer: Medicare Other | Admitting: Family Medicine

## 2019-03-30 ENCOUNTER — Encounter: Payer: Self-pay | Admitting: Family Medicine

## 2019-03-30 DIAGNOSIS — R002 Palpitations: Secondary | ICD-10-CM | POA: Diagnosis not present

## 2019-03-30 NOTE — Progress Notes (Signed)
Virtual Visit via Telephone Note  I connected with Katie [name] on 03/30/19 at 8:47 AM by telephone and verified that I am speaking with the correct person using two identifiers. Katie [name] is currently located at home and nobody is currently with her during this visit. The provider, Loman Brooklyn, FNP is located in their home at time of visit.  I discussed the limitations, risks, security and privacy concerns of performing an evaluation and management service by telephone and the availability of in person appointments. I also discussed with the patient that there may be a patient responsible charge related to this service. The patient expressed understanding and agreed to proceed.  Subjective: PCP: [name], FNP  Chief Complaint  Patient presents with  . Palpitations  . Nausea  . Choking   Palpitations: Patient has a history of atrial flutter and had an ablation done two weeks ago. When she followed up with her cardiologist on 03/14/2019 her metoprolol was discontinued. She has felt no irregular heart rhythm since, until two days ago when started feeling like her heart was "going really fast". She also gets a choking sensation like she can't get enough breath when this occurs. She is not able to tell me how often this is occurring, how long it lasts, or her heart rate. I did try to teach her how to check her pulse but she became very frustrated and told me she doesn't know how. She is scheduled for an echo in two days and is supposed to go on the 19th to have a heart monitor placed.     ROS: Per HPI  Current Outpatient Medications:  .  benazepril (LOTENSIN) 40 MG tablet, Take 1.5 tablets (60 mg total) by mouth daily., Disp: 90 tablet, Rfl: 1 .  clonazePAM (KLONOPIN) 0.5 MG tablet, Take 1 tablet (0.5 mg total) by mouth 2 (two) times daily as needed for anxiety., Disp: 30 tablet, Rfl: 1 .  cloNIDine (CATAPRES) 0.1 MG tablet, Take 1 tablet (0.1 mg total) by mouth 2 (two)  times daily., Disp: 180 tablet, Rfl: 1 .  ferrous sulfate 325 (65 FE) MG tablet, Take 325 mg by mouth daily with breakfast., Disp: , Rfl:  .  furosemide (LASIX) 40 MG tablet, TAKE ONE TABLET BY MOUTH IN THE MORNING DAILY. TAKE 1/2 TABLET BY MOUTH IN THE  AFTERNOON DAILY., Disp: 135 tablet, Rfl: 1 .  levothyroxine (SYNTHROID) 50 MCG tablet, TAKE 1 TABLET BY MOUTH  DAILY BEFORE BREAKFAST, Disp: 90 tablet, Rfl: 1 .  loratadine (CLARITIN) 10 MG tablet, Take 10 mg by mouth daily as needed for allergies., Disp: , Rfl:  .  lovastatin (MEVACOR) 40 MG tablet, Take 2 tablets (80 mg total) by mouth at bedtime., Disp: 180 tablet, Rfl: 1 .  omeprazole (PRILOSEC) 40 MG capsule, Take 1 capsule (40 mg total) by mouth daily., Disp: 90 capsule, Rfl: 1 .  potassium chloride SA (K-DUR) 20 MEQ tablet, Take 1 tablet (20 mEq total) by mouth 2 (two) times daily., Disp: 180 tablet, Rfl: 1 .  Propylene Glycol (SYSTANE BALANCE) 0.6 % SOLN, Place 1 drop into both eyes daily as needed (dry eyes)., Disp: , Rfl:  .  raloxifene (EVISTA) 60 MG tablet, Take 1 tablet (60 mg total) by mouth daily., Disp: 90 tablet, Rfl: 1 .  Rivaroxaban (XARELTO) 15 MG TABS tablet, TAKE 1 TABLET BY MOUTH ONCE DAILY WITH SUPPER (Patient taking differently: Take 15 mg by mouth daily with supper. TAKE 1 TABLET BY MOUTH  ONCE DAILY WITH SUPPER), Disp: 90 tablet, Rfl: 1 .  traMADol (ULTRAM) 50 MG tablet, Take 1 tablet (50 mg total) by mouth every 6 (six) hours as needed for up to 7 days for moderate pain. TAKE 1 TABLET BY MOUTH TWICE DAILY AS NEEDED FOR MODERATE PAIN OR SEVERE PAIN, Disp: 28 tablet, Rfl: 0  Allergies  Allergen Reactions  . Amlodipine Swelling  . Macrodantin Nausea And Vomiting  . Metformin And Related Nausea And Vomiting and Other (See Comments)    Bloating  . Penicillins Rash    Did it involve swelling of the face/tongue/throat, SOB, or low BP? Unknown Did it involve sudden or severe rash/hives, skin peeling, or any reaction on the  inside of your mouth or nose? Yes Did you need to seek medical attention at a hospital or doctor's office? Yes When did it last happen? 2005  If all above answers are "NO", may proceed with cephalosporin use.    Past Medical History:  Diagnosis Date  . Aortic insufficiency    a. Trivial AI by echo 02/2016.  [name] Atrial fibrillation and flutter ([place])    a. Coarse afib vs flutter by EKG 12/2015.  . Cataract   . Chronic diastolic CHF (congestive heart failure) ([place])   . CKD (chronic kidney disease), stage III ([place])   . Edema   . GERD (gastroesophageal reflux disease)   . Hiatal hernia   . Hypercholesterolemia   . Hypertension   . Hypokalemia   . Hypothyroidism   . Left knee pain   . NIDDM (non-insulin dependent diabetes mellitus)    diet controlled   . Obesity   . Premature atrial contractions   . PVC's (premature ventricular contractions)   . URI (upper respiratory infection)   . Vitamin D deficiency     Observations/Objective: A&O  No respiratory distress or wheezing audible over the phone Judgement and thought processes all WNL. She does seem anxious.   Assessment and Plan: 1. Heart palpitations - Since I am unable to get patient to help with vital signs and I cannot do an EKG, I have recommended she be seen in person. Our office is going to call her to schedule an appointment for today. If she is not seen in our office I encouraged her to go to urgent care or the ER.    Follow Up Instructions:  I discussed the assessment and treatment plan with the patient. The patient was provided an opportunity to ask questions and all were answered. The patient agreed with the plan and demonstrated an understanding of the instructions.   The patient was advised to call back or seek an in-person evaluation if the symptoms worsen or if the condition fails to improve as anticipated.  The above assessment and management plan was discussed with the patient. The patient verbalized  understanding of and has agreed to the management plan. Patient is aware to call the clinic if symptoms persist or worsen. Patient is aware when to return to the clinic for a follow-up visit. Patient educated on when it is appropriate to go to the emergency department.   Time call ended: 8:55  I provided 8 minutes of non-face-to-face time during this encounter.  Hendricks Limes, MSN, APRN, FNP-C [facility] 03/30/19

## 2019-04-01 ENCOUNTER — Ambulatory Visit (HOSPITAL_COMMUNITY): Payer: Medicare Other | Attending: Cardiology

## 2019-04-01 ENCOUNTER — Other Ambulatory Visit: Payer: Self-pay

## 2019-04-01 DIAGNOSIS — I495 Sick sinus syndrome: Secondary | ICD-10-CM | POA: Diagnosis not present

## 2019-04-01 DIAGNOSIS — I10 Essential (primary) hypertension: Secondary | ICD-10-CM

## 2019-04-01 DIAGNOSIS — I5032 Chronic diastolic (congestive) heart failure: Secondary | ICD-10-CM | POA: Diagnosis not present

## 2019-04-01 DIAGNOSIS — I480 Paroxysmal atrial fibrillation: Secondary | ICD-10-CM | POA: Diagnosis not present

## 2019-04-04 ENCOUNTER — Telehealth: Payer: Self-pay

## 2019-04-04 NOTE — Telephone Encounter (Signed)
Spoke with pt regarding covid-19 screening. Pt stated she has not been in contact with anyone who may have covid-19 and has no symptoms.

## 2019-04-06 ENCOUNTER — Ambulatory Visit (INDEPENDENT_AMBULATORY_CARE_PROVIDER_SITE_OTHER): Payer: Medicare Other | Admitting: Internal Medicine

## 2019-04-06 ENCOUNTER — Other Ambulatory Visit: Payer: Self-pay

## 2019-04-06 ENCOUNTER — Encounter: Payer: Self-pay | Admitting: Internal Medicine

## 2019-04-06 VITALS — BP 178/80 | HR 111 | Ht 67.0 in | Wt 164.0 lb

## 2019-04-06 DIAGNOSIS — I4892 Unspecified atrial flutter: Secondary | ICD-10-CM | POA: Diagnosis not present

## 2019-04-06 DIAGNOSIS — I10 Essential (primary) hypertension: Secondary | ICD-10-CM

## 2019-04-06 DIAGNOSIS — I483 Typical atrial flutter: Secondary | ICD-10-CM | POA: Diagnosis not present

## 2019-04-06 DIAGNOSIS — I5032 Chronic diastolic (congestive) heart failure: Secondary | ICD-10-CM

## 2019-04-06 HISTORY — PX: OTHER SURGICAL HISTORY: SHX169

## 2019-04-06 NOTE — Progress Notes (Signed)
PCP: [name], FNP   Primary EP: Dr Rayann Heman  Katie [name] is a 85 y.o. female who presents today for routine electrophysiology followup.  Since last being seen in our clinic, the patient reports doing very well.  She is not aware of arrhythmias. Today, she denies symptoms of palpitations, chest pain, shortness of breath,  lower extremity edema, dizziness, presyncope, or syncope.  The patient is otherwise without complaint today.   Past Medical History:  Diagnosis Date  . Aortic insufficiency    a. Trivial AI by echo 02/2016.  [name] Atrial fibrillation and flutter ([place])    a. Coarse afib vs flutter by EKG 12/2015.  . Cataract   . Chronic diastolic CHF (congestive heart failure) ([place])   . CKD (chronic kidney disease), stage III ([place])   . Edema   . GERD (gastroesophageal reflux disease)   . Hiatal hernia   . Hypercholesterolemia   . Hypertension   . Hypokalemia   . Hypothyroidism   . Left knee pain   . NIDDM (non-insulin dependent diabetes mellitus)    diet controlled   . Obesity   . Premature atrial contractions   . PVC's (premature ventricular contractions)   . URI (upper respiratory infection)   . Vitamin D deficiency    Past Surgical History:  Procedure Laterality Date  . A-FLUTTER ABLATION N/A 02/08/2019   Procedure: A-FLUTTER ABLATION;  Surgeon: Thompson Grayer, MD;  Location: [facility];  Service: Cardiovascular;  Laterality: N/A;  . ABDOMINAL HYSTERECTOMY    . APPENDECTOMY  1980  . BACK SURGERY    . CATARACT EXTRACTION, BILATERAL    . CHOLECYSTECTOMY  5/00  . COLONOSCOPY    . TONSILLECTOMY    . TOTAL ABDOMINAL HYSTERECTOMY W/ BILATERAL SALPINGOOPHORECTOMY  1980  . UPPER GASTROINTESTINAL ENDOSCOPY      ROS- all systems are reviewed and negatives except as per HPI above  Current Outpatient Medications  Medication Sig Dispense Refill  . benazepril (LOTENSIN) 40 MG tablet Take 1.5 tablets (60 mg total) by mouth daily. 90 tablet 1  . clonazePAM  (KLONOPIN) 0.5 MG tablet Take 1 tablet (0.5 mg total) by mouth 2 (two) times daily as needed for anxiety. 30 tablet 1  . cloNIDine (CATAPRES) 0.1 MG tablet Take 1 tablet (0.1 mg total) by mouth 2 (two) times daily. 180 tablet 1  . ferrous sulfate 325 (65 FE) MG tablet Take 325 mg by mouth daily with breakfast.    . furosemide (LASIX) 40 MG tablet TAKE ONE TABLET BY MOUTH IN THE MORNING DAILY. TAKE 1/2 TABLET BY MOUTH IN THE  AFTERNOON DAILY. 135 tablet 1  . levothyroxine (SYNTHROID) 50 MCG tablet TAKE 1 TABLET BY MOUTH  DAILY BEFORE BREAKFAST 90 tablet 1  . loratadine (CLARITIN) 10 MG tablet Take 10 mg by mouth daily as needed for allergies.    [name] lovastatin (MEVACOR) 40 MG tablet Take 2 tablets (80 mg total) by mouth at bedtime. 180 tablet 1  . omeprazole (PRILOSEC) 40 MG capsule Take 1 capsule (40 mg total) by mouth daily. 90 capsule 1  . potassium chloride SA (K-DUR) 20 MEQ tablet Take 1 tablet (20 mEq total) by mouth 2 (two) times daily. 180 tablet 1  . Propylene Glycol (SYSTANE BALANCE) 0.6 % SOLN Place 1 drop into both eyes daily as needed (dry eyes).    . raloxifene (EVISTA) 60 MG tablet Take 1 tablet (60 mg total) by mouth daily. 90 tablet 1  . Rivaroxaban (XARELTO) 15 MG  TABS tablet TAKE 1 TABLET BY MOUTH ONCE DAILY WITH SUPPER (Patient taking differently: Take 15 mg by mouth daily with supper. TAKE 1 TABLET BY MOUTH ONCE DAILY WITH SUPPER) 90 tablet 1  . traMADol (ULTRAM) 50 MG tablet Take 1 tablet (50 mg total) by mouth every 6 (six) hours as needed for up to 7 days for moderate pain. TAKE 1 TABLET BY MOUTH TWICE DAILY AS NEEDED FOR MODERATE PAIN OR SEVERE PAIN 28 tablet 0   No current facility-administered medications for this visit.     Physical Exam: Vitals:   04/06/19 0959  BP: (!) 178/80  Pulse: (!) 111  SpO2: 93%  Weight: 164 lb (74.4 kg)  Height: 5\' 7"  (1.702 m)    GEN- The patient is well appearing, alert and oriented x 3 today.   Head- normocephalic, atraumatic Eyes-   Sclera clear, conjunctiva pink Ears- hearing intact Oropharynx- clear Lungs- Clear to ausculation bilaterally, normal work of breathing Heart- Regular rate and rhythm  GI- soft  Extremities- no clubbing, cyanosis, or edema  Wt Readings from Last 3 Encounters:  04/06/19 164 lb (74.4 kg)  03/15/19 168 lb (76.2 kg)  03/10/19 168 lb (76.2 kg)    Assessment and Plan:  1. Atrial flutter Resolved s/p ablation She carries a h/o AF, though this is not well documented. Given chads2vasc score of 5, I would advise long term monitoring to further evaluate for arrhythmias.  If no AF in 4 weeks, we will plan to stop anticoagulation and resume should sustained and prolonged atrial arrhythmias occur in the future.  Risks of ILR discussed with the patient who wishes to proceed.  We also discussed remote monitoring today.  2. HTN Stable No change required today  3. Chronic diastolic dysfunction Stable No change required today  Return in 6 weeks to reassess with long term monitor in place.  If no AF, we will stop anticoagulation at that time.  Thompson Grayer MD, [facility] 04/06/2019 10:21 AM     DESCRIPTION OF PROCEDURE:  Informed written consent was obtained.  The patient required no sedation for the procedure today.  Mapping over the patient's chest was performed to identify the area where electrograms were most prominent for ILR recording.  This area was found to be the left parasternal region over the 3rd-4th intercostal space. The patients left chest was therefore prepped and draped in the usual sterile fashion. The skin overlying the left parasternal region was infiltrated with lidocaine for local analgesia.  A 0.5-cm incision was made over the left parasternal region over the 3rd intercostal space.  A subcutaneous ILR pocket was fashioned using a combination of sharp and blunt dissection.  A Med[employer]onic Reveal Linq model [id number] (SN [id number])  implantable loop recorder was then placed into the pocket   R waves were very prominent and measured >0.45mV.  Steri- Strips and a sterile dressing were then applied.  There were no early apparent complications.     CONCLUSIONS:   1. Successful implantation of a Med[employer]onic Reveal LINQ implantable loop recorder for palpitations and evaluate for atrial fibrillation post ablation  2. No early apparent complications.   Thompson Grayer MD, [facility] 04/06/2019 4:53 PM

## 2019-04-06 NOTE — Patient Instructions (Addendum)
Medication Instructions:  Your physician recommends that you continue on your current medications as directed. Please refer to the Current Medication list given to you today.  Labwork: None ordered.  Testing/Procedures: None ordered.  Follow-Up: Your physician wants you to follow-up --April 19, 2019 at 8:30 am with device clinic at [facility] office for a wound check.  Your physician wants you to follow-up in: 6 weeks with Dr. Rayann Heman for a virtual appt-  May 18, 2019 at 2:00 pm -- THIS IS A VIRTUAL VISIT.     Implantable Loop Recorder Placement  An implantable loop recorder is a small electronic device that is placed under the skin of your chest. It is about the size of an AA ("double A") battery. The device records the electrical activity of your heart over a long period of time. Your health care provider can download these recordings to monitor your heart. You may need an implantable loop recorder if you have periods of abnormal heart activity (arrhythmias) or unexplained fainting (syncope). The recorder can be left in place for 1 year or longer. Tell a health care provider about:  Any allergies you have.  All medicines you are taking, including vitamins, herbs, eye drops, creams, and over-the-counter medicines.  Any problems you or family members have had with anesthetic medicines.  Any blood disorders you have.  Any surgeries you have had.  Any medical conditions you have.  Whether you are pregnant or may be pregnant. What are the risks? Generally, this is a safe procedure. However, problems may occur, including:  Infection.  Bleeding.  Allergic reactions to anesthetic medicines.  Damage to nerves or blood vessels.  Failure of the device to work. This could require another surgery to replace it. What happens before the procedure?   You may have a physical exam, blood tests, and imaging tests of your heart, such as a chest X-ray.  Follow instructions  from your health care provider about eating or drinking restrictions.  Ask your health care provider about: ? Changing or stopping your regular medicines. This is especially important if you are taking diabetes medicines or blood thinners. ? Taking medicines such as aspirin and ibuprofen. These medicines can thin your blood. Do not take these medicines unless your health care provider tells you to take them. ? Taking over-the-counter medicines, vitamins, herbs, and supplements.  Ask your health care provider how your surgical site will be marked or identified.  Ask your health care provider what steps will be taken to help prevent infection. These may include: ? Removing hair at the surgery site. ? Washing skin with a germ-killing soap.  Plan to have someone take you home from the hospital or clinic.  Plan to have a responsible adult care for you for at least 24 hours after you leave the hospital or clinic. This is important.  Do not use any products that contain nicotine or tobacco, such as cigarettes and e-cigarettes. If you need help quitting, ask your health care provider. What happens during the procedure?  An IV will be inserted into one of your veins.  You may be given one or more of the following: ? A medicine to help you relax (sedative). ? A medicine to numb the area (local anesthetic).  A small incision will be made on the left side of your upper chest.  A pocket will be created under your skin.  The device will be placed in the pocket.  The incision will be closed with stitches (sutures) or  adhesive strips.  A bandage (dressing) will be placed over the incision. The procedure may vary among health care providers and hospitals. What happens after the procedure?  Your blood pressure, heart rate, breathing rate, and blood oxygen level will be monitored until you leave the hospital or clinic.  You may be able to go home on the day of your surgery. Before you go  home: ? Your health care provider will program your recorder. ? You will learn how to trigger your device with a handheld activator. ? You will learn how to send recordings to your health care provider. ? You will get an ID card for your device, and you will be told when to use it.  Do not drive for 24 hours if you were given a sedative during your procedure. Summary  An implantable loop recorder is a small electronic device that is placed under the skin of your chest to monitor your heart over a long period of time.  The recorder can be left in place for 1 year or longer.  Plan to have someone take you home from the hospital or clinic. This information is not intended to replace advice given to you by your health care provider. Make sure you discuss any questions you have with your health care provider. Document Released: 07/16/2015 Document Revised: 10/08/2017 Document Reviewed: 09/19/2017 Elsevier Patient Education  2020 [employer].

## 2019-04-07 ENCOUNTER — Ambulatory Visit (INDEPENDENT_AMBULATORY_CARE_PROVIDER_SITE_OTHER): Payer: Medicare Other | Admitting: Specialist

## 2019-04-07 ENCOUNTER — Encounter: Payer: Self-pay | Admitting: Specialist

## 2019-04-07 VITALS — BP 152/90 | HR 97 | Ht 67.0 in | Wt 164.0 lb

## 2019-04-07 DIAGNOSIS — M48062 Spinal stenosis, lumbar region with neurogenic claudication: Secondary | ICD-10-CM

## 2019-04-07 DIAGNOSIS — M1611 Unilateral primary osteoarthritis, right hip: Secondary | ICD-10-CM

## 2019-04-07 DIAGNOSIS — M4316 Spondylolisthesis, lumbar region: Secondary | ICD-10-CM

## 2019-04-07 MED ORDER — GABAPENTIN 100 MG PO CAPS: 100.0000 mg | ORAL_CAPSULE | Freq: Every day | ORAL | 3 refills | Status: DC

## 2019-04-07 MED ORDER — HYDROCODONE-ACETAMINOPHEN 5-325 MG PO TABS: 1.0000 | ORAL_TABLET | Freq: Four times a day (QID) | ORAL | 0 refills | Status: DC | PRN

## 2019-04-07 NOTE — Patient Instructions (Signed)
Plan: Avoid bending, stooping and avoid lifting weights greater than 10 lbs. Avoid prolong standing and walking. Avoid frequent bending and stooping  No lifting greater than 10 lbs. May use ice or moist heat for pain. Weight loss is of benefit. Handicap license is approved. MRI of the lumbar spine to assess for worsening of lumbar spinal stenosis L4-5 and right L2 nerve compression as her claudication  Symptoms are worse.  I called and spoke with Dr. Jackalyn Lombard staff and they indicated that the loop heart recorder is MRI compatible but they do recommend that on the morning of the MRI that she upload the information to Dr. Jackalyn Lombard office and she can contact the office for assistance if necessary.  Gabapentin for nerve pain at night 100 mg. Hydrocodone for more severe pain one tablet every 6 hours as needed.

## 2019-04-07 NOTE — Progress Notes (Signed)
Office Visit Note   Patient: Katie [name]           Date of Birth: 25-Nov-1931           MRN: [id number] Visit Date: 04/07/2019              Requested by: [name], [address] [place],  [place] [address] PCP: [name], FNP   Assessment & Plan: Visit Diagnoses:  1. Unilateral primary osteoarthritis, right hip   2. Spinal stenosis of lumbar region with neurogenic claudication   3. Spondylolisthesis of lumbar region     Plan: Avoid bending, stooping and avoid lifting weights greater than 10 lbs. Avoid prolong standing and walking. Avoid frequent bending and stooping  No lifting greater than 10 lbs. May use ice or moist heat for pain. Weight loss is of benefit. Handicap license is approved. MRI of the lumbar spine to assess for worsening of lumbar spinal stenosis L4-5 and right L2 nerve compression as her claudication  Symptoms are worse.  I called and spoke with Dr. Jackalyn Lombard staff and they indicated that the loop heart recorder is MRI compatible but they do recommend that on the morning of the MRI that she upload the information to Dr. Jackalyn Lombard office and she can contact the office for assistance if necessary.  Gabapentin for nerve pain at night 100 mg. Hydrocodone for more severe pain one tablet every 6 hours as needed.  Follow-Up Instructions: Return in about 3 weeks (around 04/28/2019).   Orders:  Orders Placed This Encounter  Procedures  . MR Lumbar Spine w/o contrast   Meds ordered this encounter  Medications  . HYDROcodone-acetaminophen (NORCO/VICODIN) 5-325 MG tablet    Sig: Take 1 tablet by mouth every 6 (six) hours as needed for moderate pain.    Dispense:  30 tablet    Refill:  0  . gabapentin (NEURONTIN) 100 MG capsule    Sig: Take 1 capsule (100 mg total) by mouth at bedtime.    Dispense:  30 capsule    Refill:  3      Procedures: No procedures performed   Clinical Data: No additional findings.   Subjective:  Chief Complaint  Patient presents with  . Lower Back - Follow-up    86 year old female with right upper buttock pain and into the right outer ankle. The injection stopped the pain in the right anterior and lateral thigh but has not improved the right buttock pain and the right lateral ankle pain. She notices that the right hip and right ankle pain are still significant, pain is a "5". I doesn't hurt all the time, but when she starts walking is when the pain gets worse. Pain is present with sitting also. No bowel or bladder difficulty. No pain with cough or sneeze. She can grocery shopping leaning on the buggy.     Review of Systems  Constitutional: Positive for activity change, appetite change and unexpected weight change. Negative for chills, diaphoresis, fatigue and fever.  HENT: Positive for hearing loss. Negative for congestion, dental problem, drooling, ear discharge, ear pain, facial swelling, mouth sores, nosebleeds, postnasal drip, rhinorrhea, sinus pressure, sinus pain, sneezing, sore throat, tinnitus, trouble swallowing and voice change.   Eyes: Negative.  Negative for photophobia, pain, discharge, redness, itching and visual disturbance.  Respiratory: Negative.  Negative for apnea, cough, choking, chest tightness, shortness of breath, wheezing and stridor.   Cardiovascular: Negative for chest pain, palpitations and leg swelling.  Gastrointestinal: Negative.  Negative for abdominal distention, abdominal pain, anal bleeding, blood in stool, constipation, diarrhea, nausea, rectal pain and vomiting.  Endocrine: Negative.   Genitourinary: Negative.  Negative for difficulty urinating, dyspareunia, dysuria, enuresis, flank pain, frequency, pelvic pain and urgency.  Musculoskeletal: Positive for back pain and gait problem. Negative for arthralgias, joint swelling, myalgias, neck pain and neck stiffness.  Skin: Negative.   Allergic/Immunologic: Negative for environmental allergies, food  allergies and immunocompromised state.  Neurological: Positive for weakness. Negative for dizziness, tremors, seizures, syncope, facial asymmetry, speech difficulty, light-headedness, numbness and headaches.  Hematological: Negative for adenopathy.  Psychiatric/Behavioral: Negative for agitation, behavioral problems, confusion, decreased concentration, dysphoric mood, hallucinations, self-injury, sleep disturbance and suicidal ideas. The patient is not nervous/anxious and is not hyperactive.      Objective: Vital Signs: BP (!) 152/90   Pulse 97   Ht 5\' 7"  (1.702 m)   Wt 164 lb (74.4 kg)   BMI 25.69 kg/m   Physical Exam Constitutional:      Appearance: She is well-developed.  HENT:     Head: Normocephalic and atraumatic.  Eyes:     Pupils: Pupils are equal, round, and reactive to light.  Neck:     Musculoskeletal: Normal range of motion and neck supple.  Pulmonary:     Effort: Pulmonary effort is normal.     Breath sounds: Normal breath sounds.  Abdominal:     General: Bowel sounds are normal.     Palpations: Abdomen is soft.  Skin:    General: Skin is warm and dry.  Neurological:     Mental Status: She is alert and oriented to person, place, and time.  Psychiatric:        Behavior: Behavior normal.        Thought Content: Thought content normal.        Judgment: Judgment normal.     Back Exam   Tenderness  The patient is experiencing tenderness in the lumbar.  Range of Motion  Extension: normal  Flexion: normal  Lateral bend right: normal  Lateral bend left: normal  Rotation right: normal  Rotation left: normal   Tests  Straight leg raise right: negative Straight leg raise left: negative  Reflexes  Patellar: 0/4 Achilles: 0/4 Babinski's sign: normal   Other  Toe walk: normal Heel walk: abnormal Sensation: normal Erythema: no back redness Scars: absent  Comments:  Right hip flexor weakness 4/5, right knee weakness 5-/5  Normal left leg strength,   Pain with right hip flexion and ir and er in rt hip flexion.      Specialty Comments:  No specialty comments available.  Imaging: No results found.   PMFS History: Patient Active Problem List   Diagnosis Date Noted  . Atrial flutter ([place]) 02/08/2019  . Back pain 01/24/2019  . Chronic midline low back pain without sciatica 10/22/2018  . Primary insomnia 10/22/2018  . Persistent atrial fibrillation 04/27/2017  . Chronic diastolic CHF (congestive heart failure) ([place]) 04/27/2017  . CKD (chronic kidney disease) stage 3, GFR 30-59 ml/min ([facility]) 05/14/2015  . BMI 26.0-26.9,adult 05/11/2015  . Peripheral edema 07/06/2014  . Hypokalemia 07/22/2013  . Hypothyroidism 07/22/2013  . Osteopenia, senile 03/09/2013  . Constipation 01/04/2013  . Hypertension 05/07/2012  . Hyperlipidemia 05/07/2012  . Diabetes mellitus type 2, diet-controlled ([place]) 05/07/2012  . GERD (gastroesophageal reflux disease) 05/07/2012   Past Medical History:  Diagnosis Date  . Aortic insufficiency    a. Trivial AI by echo 02/2016.  [name] Atrial fibrillation and flutter (  [place])    a. Coarse afib vs flutter by EKG 12/2015.  . Cataract   . Chronic diastolic CHF (congestive heart failure) ([place])   . CKD (chronic kidney disease), stage III ([place])   . Edema   . GERD (gastroesophageal reflux disease)   . Hiatal hernia   . Hypercholesterolemia   . Hypertension   . Hypokalemia   . Hypothyroidism   . Left knee pain   . NIDDM (non-insulin dependent diabetes mellitus)    diet controlled   . Obesity   . Premature atrial contractions   . PVC's (premature ventricular contractions)   . URI (upper respiratory infection)   . Vitamin D deficiency     Family History  Problem Relation Age of Onset  . Diabetes Mother   . Stroke Mother   . Heart disease Father   . Stroke Father   . Uterine cancer Sister   . Diabetes Sister   . Ovarian cancer Sister   . Colon cancer Sister   . Diabetes Sister   . Liver cancer Sister         \  . Diabetes Brother   . Dementia Brother   . Atrial fibrillation Sister   . Diabetes Sister   . Diabetes Son   . Heart attack Neg Hx   . Hypertension Neg Hx     Past Surgical History:  Procedure Laterality Date  . A-FLUTTER ABLATION N/A 02/08/2019   Procedure: A-FLUTTER ABLATION;  Surgeon: Thompson Grayer, MD;  Location: [facility];  Service: Cardiovascular;  Laterality: N/A;  . ABDOMINAL HYSTERECTOMY    . APPENDECTOMY  1980  . BACK SURGERY    . CATARACT EXTRACTION, BILATERAL    . CHOLECYSTECTOMY  5/00  . COLONOSCOPY    . implantable loop recorder placement  04/06/2019   MDT Reveal LINQ [id number] [phone] [id number]) implanted for evaluation of palpitations and afib post atrial flutter ablation by Dr Rayann Heman in office  . TONSILLECTOMY    . TOTAL ABDOMINAL HYSTERECTOMY W/ BILATERAL SALPINGOOPHORECTOMY  1980  . UPPER GASTROINTESTINAL ENDOSCOPY     Social History   Occupational History  . Occupation: Retired  Tobacco Use  . Smoking status: Never Smoker  . Smokeless tobacco: Never Used  Substance and Sexual Activity  . Alcohol use: No  . Drug use: No  . Sexual activity: Not Currently

## 2019-04-11 ENCOUNTER — Other Ambulatory Visit: Payer: Self-pay | Admitting: Nurse Practitioner

## 2019-04-11 DIAGNOSIS — K219 Gastro-esophageal reflux disease without esophagitis: Secondary | ICD-10-CM

## 2019-04-18 ENCOUNTER — Telehealth: Payer: Self-pay

## 2019-04-18 NOTE — Telephone Encounter (Signed)
    COVID-19 Pre-Screening Questions:  . In the past 7 to 10 days have you had a cough,  shortness of breath, headache, congestion, fever (100 or greater) body aches, chills, sore throat, or sudden loss of taste or sense of smell? No . Have you been around anyone with known Covid 19. No . Have you been around anyone who is awaiting Covid 19 test results in the past 7 to 10 days? No . Have you been around anyone who has been exposed to Covid 19, or has mentioned symptoms of Covid 19 within the past 7 to 10 days? No  If you have any concerns/questions about symptoms patients report during screening (either on the phone or at threshold). Contact the provider seeing the patient or DOD for further guidance.  If neither are available contact a member of the leadership team.         Pt answered no to all covid-19 prescreening questions. I asked the pt to wear a mask to her appointment. I let the pt know we are reducing the number of people coming into the office and if she can physically come alone to please do so. If anything changes between now and her appointment time to please call to let us know. The patient verbalized understanding.

## 2019-04-19 ENCOUNTER — Other Ambulatory Visit: Payer: Self-pay

## 2019-04-19 ENCOUNTER — Ambulatory Visit (INDEPENDENT_AMBULATORY_CARE_PROVIDER_SITE_OTHER): Payer: Medicare Other | Admitting: *Deleted

## 2019-04-19 DIAGNOSIS — I480 Paroxysmal atrial fibrillation: Secondary | ICD-10-CM

## 2019-04-19 LAB — CUP PACEART INCLINIC DEVICE CHECK
Date Time Interrogation Session: 20200901084808
Implantable Pulse Generator Implant Date: 20200819

## 2019-04-19 NOTE — Patient Instructions (Signed)
LINQ loop recorder is MRI compatible.

## 2019-04-19 NOTE — Progress Notes (Signed)
ILR wound check in clinic. Steri strips removed. Wound well healed. r-waves 0.55 mV. Home monitor transmitting nightly. No episodes. Questions answered.

## 2019-04-21 ENCOUNTER — Other Ambulatory Visit: Payer: Self-pay

## 2019-04-22 ENCOUNTER — Encounter: Payer: Self-pay | Admitting: Nurse Practitioner

## 2019-04-22 ENCOUNTER — Ambulatory Visit (INDEPENDENT_AMBULATORY_CARE_PROVIDER_SITE_OTHER): Payer: Medicare Other | Admitting: Nurse Practitioner

## 2019-04-22 VITALS — BP 148/69 | HR 62 | Temp 97.5°F | Ht 67.0 in | Wt 170.0 lb

## 2019-04-22 DIAGNOSIS — N183 Chronic kidney disease, stage 3 unspecified: Secondary | ICD-10-CM

## 2019-04-22 DIAGNOSIS — E876 Hypokalemia: Secondary | ICD-10-CM

## 2019-04-22 DIAGNOSIS — E785 Hyperlipidemia, unspecified: Secondary | ICD-10-CM

## 2019-04-22 DIAGNOSIS — I4819 Other persistent atrial fibrillation: Secondary | ICD-10-CM | POA: Diagnosis not present

## 2019-04-22 DIAGNOSIS — R609 Edema, unspecified: Secondary | ICD-10-CM

## 2019-04-22 DIAGNOSIS — K219 Gastro-esophageal reflux disease without esophagitis: Secondary | ICD-10-CM

## 2019-04-22 DIAGNOSIS — I5032 Chronic diastolic (congestive) heart failure: Secondary | ICD-10-CM | POA: Diagnosis not present

## 2019-04-22 DIAGNOSIS — I10 Essential (primary) hypertension: Secondary | ICD-10-CM | POA: Diagnosis not present

## 2019-04-22 DIAGNOSIS — E032 Hypothyroidism due to medicaments and other exogenous substances: Secondary | ICD-10-CM

## 2019-04-22 DIAGNOSIS — F5101 Primary insomnia: Secondary | ICD-10-CM

## 2019-04-22 DIAGNOSIS — E119 Type 2 diabetes mellitus without complications: Secondary | ICD-10-CM | POA: Diagnosis not present

## 2019-04-22 DIAGNOSIS — G8929 Other chronic pain: Secondary | ICD-10-CM

## 2019-04-22 DIAGNOSIS — Z23 Encounter for immunization: Secondary | ICD-10-CM

## 2019-04-22 DIAGNOSIS — M545 Low back pain, unspecified: Secondary | ICD-10-CM

## 2019-04-22 DIAGNOSIS — R600 Localized edema: Secondary | ICD-10-CM

## 2019-04-22 DIAGNOSIS — Z6826 Body mass index (BMI) 26.0-26.9, adult: Secondary | ICD-10-CM

## 2019-04-22 LAB — BAYER DCA HB A1C WAIVED: HB A1C (BAYER DCA - WAIVED): 7.2 % — ABNORMAL HIGH (ref <7.0)

## 2019-04-22 NOTE — Progress Notes (Signed)
Subjective:    Patient ID: Katie [name], female    DOB: 27-Oct-1931, 85 y.o.   MRN: [id number]   Chief Complaint: medical management of chronic issues   HPI:  1. Essential hypertension No c/o chest pain,sob or headache. Does not check blood pressure at home. BP Readings from Last 3 Encounters:  04/07/19 (!) 152/90  04/06/19 (!) 178/80  03/15/19 (!) 179/82     2. Chronic diastolic CHF (congestive heart failure) ([place]) She saw cardiology in august 2020. No change to care of CHF  3. Persistent atrial fibrillation She had a loop recorder implanted so that her heart could be monitored better.they would like to get her of blood thinner.  4. Diabetes mellitus type 2, diet-controlled ([place]) She does not check her blood sugars every day. Denies any symptoms of low blood sugar. Lab Results  Component Value Date   HGBA1C 6.4 01/24/2019     5. Gastroesophageal reflux disease without esophagitis She takes omeprazole daily to keep symptoms under control. Doing well.  6. Hypothyroidism due to non-medication exogenous substances No problems that she is aware of.  7. Hyperlipidemia, unspecified hyperlipidemia type She tries to watch diet ,  But is not able to do much exercise  8. Hypokalemia deneies any lower ext cramping. Takes daily supplement. Lab Results  Component Value Date   K 4.5 02/04/2019     9. Peripheral edema Usually has some swelling by the end of the day. Wears compression socks sometimes  10. Primary insomnia Is sleeping better. Had a hard time sleeping when her husband first died.  11. CKD (chronic kidney disease) stage 3, GFR 30-59 ml/min (HCC) Lab Results  Component Value Date   CREATININE 1.88 (H) 02/04/2019     12. Chronic midline low back pain without sciatica Has chronic back pain. Sh erecently saw Dr. Louanne Skye and he gave her some norco which helped  67. BMI 26.0-26.9,adult No recent weight changes    Outpatient Encounter Medications as of  04/22/2019  Medication Sig  . benazepril (LOTENSIN) 40 MG tablet Take 1.5 tablets (60 mg total) by mouth daily.  . clonazePAM (KLONOPIN) 0.5 MG tablet Take 1 tablet (0.5 mg total) by mouth 2 (two) times daily as needed for anxiety.  . cloNIDine (CATAPRES) 0.1 MG tablet Take 1 tablet (0.1 mg total) by mouth 2 (two) times daily.  . ferrous sulfate 325 (65 FE) MG tablet Take 325 mg by mouth daily with breakfast.  . furosemide (LASIX) 40 MG tablet TAKE ONE TABLET BY MOUTH IN THE MORNING DAILY. TAKE 1/2 TABLET BY MOUTH IN THE  AFTERNOON DAILY.  [name] gabapentin (NEURONTIN) 100 MG capsule Take 1 capsule (100 mg total) by mouth at bedtime.  [name] HYDROcodone-acetaminophen (NORCO/VICODIN) 5-325 MG tablet Take 1 tablet by mouth every 6 (six) hours as needed for moderate pain.  [name] levothyroxine (SYNTHROID) 50 MCG tablet TAKE 1 TABLET BY MOUTH  DAILY BEFORE BREAKFAST  . loratadine (CLARITIN) 10 MG tablet Take 10 mg by mouth daily as needed for allergies.  [name] lovastatin (MEVACOR) 40 MG tablet Take 2 tablets (80 mg total) by mouth at bedtime.  [name] omeprazole (PRILOSEC) 40 MG capsule TAKE 1 CAPSULE BY MOUTH  DAILY  . potassium chloride SA (K-DUR) 20 MEQ tablet Take 1 tablet (20 mEq total) by mouth 2 (two) times daily.  [name] Propylene Glycol (SYSTANE BALANCE) 0.6 % SOLN Place 1 drop into both eyes daily as needed (dry eyes).  . raloxifene (EVISTA) 60 MG tablet Take 1 tablet (  60 mg total) by mouth daily.  . Rivaroxaban (XARELTO) 15 MG TABS tablet TAKE 1 TABLET BY MOUTH ONCE DAILY WITH SUPPER (Patient taking differently: Take 15 mg by mouth daily with supper. TAKE 1 TABLET BY MOUTH ONCE DAILY WITH SUPPER)  . traMADol (ULTRAM) 50 MG tablet Take 1 tablet (50 mg total) by mouth every 6 (six) hours as needed for up to 7 days for moderate pain. TAKE 1 TABLET BY MOUTH TWICE DAILY AS NEEDED FOR MODERATE PAIN OR SEVERE PAIN     Past Surgical History:  Procedure Laterality Date  . A-FLUTTER ABLATION N/A 02/08/2019   Procedure:  A-FLUTTER ABLATION;  Surgeon: Thompson Grayer, MD;  Location: [facility];  Service: Cardiovascular;  Laterality: N/A;  . ABDOMINAL HYSTERECTOMY    . APPENDECTOMY  1980  . BACK SURGERY    . CATARACT EXTRACTION, BILATERAL    . CHOLECYSTECTOMY  5/00  . COLONOSCOPY    . implantable loop recorder placement  04/06/2019   MDT Reveal LINQ [id number] S) implanted for evaluation of palpitations and afib post atrial flutter ablation by Dr Rayann Heman in office  . TONSILLECTOMY    . TOTAL ABDOMINAL HYSTERECTOMY W/ BILATERAL SALPINGOOPHORECTOMY  1980  . UPPER GASTROINTESTINAL ENDOSCOPY      Family History  Problem Relation Age of Onset  . Diabetes Mother   . Stroke Mother   . Heart disease Father   . Stroke Father   . Uterine cancer Sister   . Diabetes Sister   . Ovarian cancer Sister   . Colon cancer Sister   . Diabetes Sister   . Liver cancer Sister        \  . Diabetes Brother   . Dementia Brother   . Atrial fibrillation Sister   . Diabetes Sister   . Diabetes Son   . Heart attack Neg Hx   . Hypertension Neg Hx     New complaints: Right hip pain- seeing DR. [place]  Social history: Lives by herself since her husband died earlier in the year.  Controlled substance contract: 04/06/18    Review of Systems  Constitutional: Negative for activity change and appetite change.  HENT: Negative.   Eyes: Negative for pain.  Respiratory: Negative for shortness of breath.   Cardiovascular: Negative for chest pain, palpitations and leg swelling.  Gastrointestinal: Negative for abdominal pain.  Endocrine: Negative for polydipsia.  Genitourinary: Negative.   Musculoskeletal: Positive for arthralgias (left hip pian).  Skin: Negative for rash.  Neurological: Negative for dizziness, weakness and headaches.  Hematological: Does not bruise/bleed easily.  Psychiatric/Behavioral: Negative.   All other systems reviewed and are negative.      Objective:   Physical Exam Vitals signs  and nursing note reviewed.  Constitutional:      General: She is not in acute distress.    Appearance: Normal appearance. She is well-developed.  HENT:     Head: Normocephalic.     Nose: Nose normal.  Eyes:     Pupils: Pupils are equal, round, and reactive to light.  Neck:     Musculoskeletal: Normal range of motion and neck supple.     Vascular: No carotid bruit or JVD.  Cardiovascular:     Rate and Rhythm: Normal rate and regular rhythm.     Heart sounds: Murmur (2/6) present.  Pulmonary:     Effort: Pulmonary effort is normal. No respiratory distress.     Breath sounds: Normal breath sounds. No wheezing or rales.  Chest:  Chest wall: No tenderness.  Abdominal:     General: Bowel sounds are normal. There is no distension or abdominal bruit.     Palpations: Abdomen is soft. There is no hepatomegaly, splenomegaly, mass or pulsatile mass.     Tenderness: There is no abdominal tenderness.  Musculoskeletal: Normal range of motion.     Right lower leg: Edema (1+) present.     Left lower leg: Edema (1+) present.  Lymphadenopathy:     Cervical: No cervical adenopathy.  Skin:    General: Skin is warm and dry.  Neurological:     Mental Status: She is alert and oriented to person, place, and time.     Deep Tendon Reflexes: Reflexes are normal and symmetric.  Psychiatric:        Behavior: Behavior normal.        Thought Content: Thought content normal.        Judgment: Judgment normal.    BP (!) 148/69   Pulse 62   Temp (!) 97.5 F (36.4 C) (Oral)   Ht _0  (1.702 m)   Wt 170 lb (77.1 kg)   BMI 26.63 kg/m   hgba1c 7.2%     Assessment & Plan:  Katie [name] comes in today with chief complaint of Medical Management of Chronic Issues   Diagnosis and orders addressed:  1. Essential hypertension Low sodium diet - CMP14+EGFR  2. Chronic diastolic CHF (congestive heart failure) ([place]) Keep follow up with cardiollgy  3. Persistent atrial fibrillation Avoid caffeine  Keep follow up with cardiology  4. Diabetes mellitus type 2, diet-controlled ([place]) Continue to watch carbs in diet - Bayer DCA Hb A1c Waived - Microalbumin / creatinine urine ratio  5. Gastroesophageal reflux disease without esophagitis Avoid spicy foods Do not eat 2 hours prior to bedtime  6. Hypothyroidism due to non-medication exogenous substances  7. Hyperlipidemia, unspecified hyperlipidemia type Low fta diet - Lipid panel  8. Hypokalemia Continue potassium supplements  9. Peripheral edema elevate legs when sitting  10. Primary insomnia Bedtime routine  11. CKD (chronic kidney disease) stage 3, GFR 30-59 ml/min (HCC) Labs pending  12. Chronic midline low back pain without sciatica  13. BMI 26.0-26.9,adult Discussed diet and exercise for person with BMI >25 Will recheck weight in 3-6 months    Labs pending Health Maintenance reviewed Diet and exercise encouraged  Follow up plan: 3 months   Mary-Margaret Hassell Done, FNP

## 2019-04-22 NOTE — Patient Instructions (Signed)

## 2019-04-23 LAB — CMP14+EGFR
ALT: 8 IU/L (ref 0–32)
AST: 13 IU/L (ref 0–40)
Albumin/Globulin Ratio: 1.9 (ref 1.2–2.2)
Albumin: 3.9 g/dL (ref 3.6–4.6)
Alkaline Phosphatase: 82 IU/L (ref 39–117)
BUN/Creatinine Ratio: 13 (ref 12–28)
BUN: 17 mg/dL (ref 8–27)
Bilirubin Total: 0.3 mg/dL (ref 0.0–1.2)
CO2: 26 mmol/L (ref 20–29)
Calcium: 9 mg/dL (ref 8.7–10.3)
Chloride: 102 mmol/L (ref 96–106)
Creatinine, Ser: 1.33 mg/dL — ABNORMAL HIGH (ref 0.57–1.00)
GFR calc Af Amer: 42 mL/min/{1.73_m2} — ABNORMAL LOW (ref >59)
GFR calc non Af Amer: 36 mL/min/{1.73_m2} — ABNORMAL LOW (ref >59)
Globulin, Total: 2.1 g/dL (ref 1.5–4.5)
Glucose: 140 mg/dL — ABNORMAL HIGH (ref 65–99)
Potassium: 4.4 mmol/L (ref 3.5–5.2)
Sodium: 141 mmol/L (ref 134–144)
Total Protein: 6 g/dL (ref 6.0–8.5)

## 2019-04-23 LAB — LIPID PANEL
Chol/HDL Ratio: 2.8 ratio (ref 0.0–4.4)
Cholesterol, Total: 156 mg/dL (ref 100–199)
HDL: 55 mg/dL (ref >39)
LDL Chol Calc (NIH): 77 mg/dL (ref 0–99)
Triglycerides: 136 mg/dL (ref 0–149)
VLDL Cholesterol Cal: 24 mg/dL (ref 5–40)

## 2019-04-23 LAB — MICROALBUMIN / CREATININE URINE RATIO
Creatinine, Urine: 18.7 mg/dL
Microalb/Creat Ratio: 25 mg/g creat (ref 0–29)
Microalbumin, Urine: 4.6 ug/mL

## 2019-04-28 ENCOUNTER — Ambulatory Visit: Payer: Self-pay | Admitting: Nurse Practitioner

## 2019-04-29 ENCOUNTER — Ambulatory Visit: Payer: Medicare Other | Admitting: Specialist

## 2019-05-02 ENCOUNTER — Other Ambulatory Visit: Payer: Self-pay | Admitting: Nurse Practitioner

## 2019-05-02 DIAGNOSIS — I10 Essential (primary) hypertension: Secondary | ICD-10-CM

## 2019-05-02 DIAGNOSIS — E032 Hypothyroidism due to medicaments and other exogenous substances: Secondary | ICD-10-CM

## 2019-05-02 DIAGNOSIS — E876 Hypokalemia: Secondary | ICD-10-CM

## 2019-05-02 DIAGNOSIS — M8580 Other specified disorders of bone density and structure, unspecified site: Secondary | ICD-10-CM

## 2019-05-04 ENCOUNTER — Ambulatory Visit: Payer: Medicare Other | Admitting: Specialist

## 2019-05-09 ENCOUNTER — Ambulatory Visit (INDEPENDENT_AMBULATORY_CARE_PROVIDER_SITE_OTHER): Payer: Medicare Other | Admitting: *Deleted

## 2019-05-09 ENCOUNTER — Other Ambulatory Visit: Payer: Self-pay | Admitting: Nurse Practitioner

## 2019-05-09 ENCOUNTER — Telehealth: Payer: Self-pay | Admitting: Nurse Practitioner

## 2019-05-09 VITALS — BP 148/69 | HR 62 | Ht 67.0 in | Wt 170.0 lb

## 2019-05-09 DIAGNOSIS — M8580 Other specified disorders of bone density and structure, unspecified site: Secondary | ICD-10-CM

## 2019-05-09 DIAGNOSIS — Z0000 Encounter for general adult medical examination without abnormal findings: Secondary | ICD-10-CM | POA: Diagnosis not present

## 2019-05-09 DIAGNOSIS — I480 Paroxysmal atrial fibrillation: Secondary | ICD-10-CM | POA: Diagnosis not present

## 2019-05-09 NOTE — Telephone Encounter (Signed)
Patient wants it sent to [facility].

## 2019-05-09 NOTE — Progress Notes (Addendum)
MEDICARE ANNUAL WELLNESS VISIT  05/09/2019  Telephone Visit Disclaimer This Medicare AWV was conducted by telephone due to national recommendations for restrictions regarding the COVID-19 Pandemic (e.g. social distancing).  I verified, using two identifiers, that I am speaking with Katie [name] or their authorized healthcare agent. I discussed the limitations, risks, security, and privacy concerns of performing an evaluation and management service by telephone and the potential availability of an in-person appointment in the future. The patient expressed understanding and agreed to proceed.   Subjective:  Katie [name] is a 85 y.o. female patient of Katie [name], [place] who had a Medicare Annual Wellness Visit today via telephone. Katie [name] is Retired and lives alone. she has 2 children. she reports that she is socially active and does interact with friends/family regularly. she is not physically active and enjoys reading.  Patient Care Team: Katie [name], [place] as PCP - General (Nurse Practitioner) [name], MD as PCP - Cardiology (Cardiology) [name], MD as Consulting Physician (Cardiology) Renard Hamper Marguerita Merles (Physician Assistant) Jessy Oto, MD as Consulting Physician (Orthopedic Surgery) Melina Schools, OD (Optometry) Shea Evans Norva Riffle, LCSW as Social Worker (Licensed Clinical Social Worker)  Advanced Directives 05/09/2019 02/08/2019 01/28/2019 05/05/2018 04/06/2017 03/26/2016 07/06/2014  Does Patient Have a Medical Advance Directive? No No No No Yes No Yes  Type of Advance Directive - - - - Living will;Healthcare Power of Attorney - -  Does patient want to make changes to medical advance directive? - - - - Yes (MAU/Ambulatory/Procedural Areas - Information given) - -  Copy of Success in Chart? - - - - No - copy requested - No - copy requested  Would patient like information on creating a medical advance directive? No -  Patient declined No - Patient declined No - Patient declined No - Patient declined - No - patient declined information -    Hospital Utilization Over the Past 12 Months: # of hospitalizations or ER visits: 1 # of surgeries: 1  Review of Systems    Patient reports that her overall health is worse compared to last year, due to recent heart issues.  General ROS: negative  Patient Reported Readings (BP, Pulse, CBG, Weight, etc) BP (!) 148/69 Comment: last OV reading - does not check at home  Pulse 62   Ht 5\' 7"  (1.702 m)   Wt 170 lb (77.1 kg)   BMI 26.63 kg/m    Pain Assessment       Current Medications & Allergies (verified) Allergies as of 05/09/2019      Reactions   Amlodipine Swelling   Macrodantin Nausea And Vomiting   Metformin And Related Nausea And Vomiting, Other (See Comments)   Bloating   Penicillins Rash   Did it involve swelling of the face/tongue/throat, SOB, or low BP? Unknown Did it involve sudden or severe rash/hives, skin peeling, or any reaction on the inside of your mouth or nose? Yes Did you need to seek medical attention at a hospital or doctor's office? Yes When did it last happen? 2005 If all above answers are "NO", may proceed with cephalosporin use.      Medication List       Accurate as of May 09, 2019  2:48 PM. If you have any questions, ask your nurse or doctor.        STOP taking these medications   ferrous sulfate 325 (65 FE) MG tablet     TAKE these  medications   benazepril 40 MG tablet Commonly known as: LOTENSIN TAKE 1 AND 1/2 TABLETS BY  MOUTH ONCE DAILY   clonazePAM 0.5 MG tablet Commonly known as: KLONOPIN Take 1 tablet (0.5 mg total) by mouth 2 (two) times daily as needed for anxiety.   cloNIDine 0.1 MG tablet Commonly known as: CATAPRES Take 1 tablet (0.1 mg total) by mouth 2 (two) times daily.   furosemide 40 MG tablet Commonly known as: LASIX TAKE ONE TABLET BY MOUTH IN THE MORNING DAILY. TAKE 1/2 TABLET BY  MOUTH IN THE  AFTERNOON DAILY.   gabapentin 100 MG capsule Commonly known as: NEURONTIN Take 1 capsule (100 mg total) by mouth at bedtime.   HYDROcodone-acetaminophen 5-325 MG tablet Commonly known as: NORCO/VICODIN Take 1 tablet by mouth every 6 (six) hours as needed for moderate pain.   levothyroxine 50 MCG tablet Commonly known as: SYNTHROID Take 50 mcg by mouth daily before breakfast. What changed: Another medication with the same name was removed. Continue taking this medication, and follow the directions you see here.   loratadine 10 MG tablet Commonly known as: CLARITIN Take 10 mg by mouth daily as needed for allergies.   lovastatin 40 MG tablet Commonly known as: MEVACOR Take 2 tablets (80 mg total) by mouth at bedtime.   omeprazole 40 MG capsule Commonly known as: PRILOSEC TAKE 1 CAPSULE BY MOUTH  DAILY   potassium chloride SA 20 MEQ tablet Commonly known as: K-DUR TAKE 1 TABLET BY MOUTH TWO  TIMES DAILY   raloxifene 60 MG tablet Commonly known as: EVISTA TAKE 1 TABLET BY MOUTH  DAILY   Rivaroxaban 15 MG Tabs tablet Commonly known as: Xarelto TAKE 1 TABLET BY MOUTH ONCE DAILY WITH SUPPER What changed:   how much to take  how to take this  when to take this   Systane Balance 0.6 % Soln Generic drug: Propylene Glycol Place 1 drop into both eyes daily as needed (dry eyes).   traMADol 50 MG tablet Commonly known as: ULTRAM Take 1 tablet (50 mg total) by mouth every 6 (six) hours as needed for up to 7 days for moderate pain. TAKE 1 TABLET BY MOUTH TWICE DAILY AS NEEDED FOR MODERATE PAIN OR SEVERE PAIN       History (reviewed): Past Medical History:  Diagnosis Date  . Aortic insufficiency    a. Trivial AI by echo 02/2016.  [name] Atrial fibrillation and flutter ([place])    a. Coarse afib vs flutter by EKG 12/2015.  . Cataract   . Chronic diastolic CHF (congestive heart failure) ([place])   . CKD (chronic kidney disease), stage III ([place])   . Edema   . GERD  (gastroesophageal reflux disease)   . Hiatal hernia   . Hypercholesterolemia   . Hypertension   . Hypokalemia   . Hypothyroidism   . Left knee pain   . NIDDM (non-insulin dependent diabetes mellitus)    diet controlled   . Obesity   . Premature atrial contractions   . PVC's (premature ventricular contractions)   . URI (upper respiratory infection)   . Vitamin D deficiency    Past Surgical History:  Procedure Laterality Date  . A-FLUTTER ABLATION N/A 02/08/2019   Procedure: A-FLUTTER ABLATION;  Surgeon: Thompson Grayer, MD;  Location: [facility];  Service: Cardiovascular;  Laterality: N/A;  . ABDOMINAL HYSTERECTOMY    . APPENDECTOMY  1980  . BACK SURGERY    . CATARACT EXTRACTION, BILATERAL    . CHOLECYSTECTOMY  5/00  . COLONOSCOPY    . implantable loop recorder placement  04/06/2019   MDT Reveal LINQ [id number] S) implanted for evaluation of palpitations and afib post atrial flutter ablation by Dr Rayann Heman in office  . TONSILLECTOMY    . TOTAL ABDOMINAL HYSTERECTOMY W/ BILATERAL SALPINGOOPHORECTOMY  1980  . UPPER GASTROINTESTINAL ENDOSCOPY     Family History  Problem Relation Age of Onset  . Diabetes Mother   . Stroke Mother   . Heart disease Father   . Stroke Father   . Uterine cancer Sister   . Diabetes Sister   . Ovarian cancer Sister   . Colon cancer Sister   . Diabetes Sister   . Liver cancer Sister        \  . Diabetes Brother   . Dementia Brother   . Atrial fibrillation Sister   . Diabetes Sister   . Diabetes Son   . Healthy Son   . Heart attack Neg Hx   . Hypertension Neg Hx    Social History   Socioeconomic History  . Marital status: Widowed    Spouse name: Not on file  . Number of children: 2  . Years of education: Not on file  . Highest education level: High school graduate  Occupational History  . Occupation: Retired    Comment: Teacher, adult education , [place], farm   Social Needs  . Financial resource strain: Somewhat hard  . Food  insecurity    Worry: Never true    Inability: Never true  . Transportation needs    Medical: No    Non-medical: No  Tobacco Use  . Smoking status: Never Smoker  . Smokeless tobacco: Never Used  Substance and Sexual Activity  . Alcohol use: No  . Drug use: No  . Sexual activity: Not Currently  Lifestyle  . Physical activity    Days per week: 0 days    Minutes per session: 0 min  . Stress: Not at all  Relationships  . Social connections    Talks on phone: More than three times a week    Gets together: More than three times a week    Attends religious service: Never    Active member of club or organization: No    Attends meetings of clubs or organizations: Never    Relationship status: Married  Other Topics Concern  . Not on file  Social History Narrative  . Not on file    Activities of Daily Living In your present state of health, do you have any difficulty performing the following activities: 05/09/2019  Hearing? Y  Comment hearing aid bilateral  Vision? N  Comment cataracts removed / readers  Difficulty concentrating or making decisions? N  Walking or climbing stairs? N  Dressing or bathing? N  Doing errands, shopping? N  Preparing Food and eating ? N  Using the Toilet? N  In the past six months, have you accidently leaked urine? N  Do you have problems with loss of bowel control? N  Managing your Medications? N  Managing your Finances? N  Housekeeping or managing your Housekeeping? N  Some recent data might be hidden    Patient Education/ Literacy    Exercise Current Exercise Habits: The patient does not participate in regular exercise at present, Exercise limited by: None identified  Diet Patient reports consuming 2 meals a day and 2 snack(s) a day Patient reports that her primary diet is: Regular Patient reports that she does have  regular access to food.   Depression Screen PHQ 2/9 Scores 05/09/2019 04/22/2019 01/24/2019 10/22/2018 10/12/2018 09/28/2018  07/21/2018  PHQ - 2 Score 0 0 0 0 0 0 0  PHQ- 9 Score - - - - - - -     Fall Risk Fall Risk  05/09/2019 04/22/2019 01/24/2019 10/22/2018 10/12/2018  Falls in the past year? 0 0 0 0 0  Number falls in past yr: - - - - -  Injury with Fall? - - - - -  Risk for fall due to : - - - - -  Risk for fall due to: Comment - - - - -     Objective:  Katie [name] seemed alert and oriented and she participated appropriately during our telephone visit.  Blood Pressure Weight BMI  BP Readings from Last 3 Encounters:  05/09/19 (!) 148/69  04/22/19 (!) 148/69  04/07/19 (!) 152/90   Wt Readings from Last 3 Encounters:  05/09/19 170 lb (77.1 kg)  04/22/19 170 lb (77.1 kg)  04/07/19 164 lb (74.4 kg)   BMI Readings from Last 1 Encounters:  05/09/19 26.63 kg/m    *Unable to obtain current vital signs, weight, and BMI due to telephone visit type  Hearing/Vision  . Anasophia did not seem to have difficulty with hearing/understanding during the telephone conversation . Reports that she has had a formal eye exam by an eye care professional within the past year . Reports that she has not had a formal hearing evaluation within the past year *Unable to fully assess hearing and vision during telephone visit type  Cognitive Function: 6CIT Screen 05/09/2019  What Year? 0 points  What month? 0 points  What time? 0 points  Count back from 20 0 points  Months in reverse 0 points  Repeat phrase 0 points  Total Score 0   (Normal:0-7, Significant for Dysfunction: >8)  Normal Cognitive Function Screening: Yes   Immunization & Health Maintenance Record Immunization History  Administered Date(s) Administered  . Fluad Quad(high Dose 65+) 04/22/2019  . Influenza, High Dose Seasonal PF 04/27/2017, 06/01/2018  . Influenza,inj,Quad PF,6+ Mos 05/09/2013, 05/18/2014, 04/17/2016  . Influenza-Unspecified 04/28/2017  . Pneumococcal Conjugate-13 02/07/2015  . Pneumococcal Polysaccharide-23 06/30/2012  . Tdap 12/01/2011   . Zoster Recombinat (Shingrix) 04/28/2017, 07/16/2017    Health Maintenance  Topic Date Due  . OPHTHALMOLOGY EXAM  01/07/2016  . COLONOSCOPY  05/28/2017  . DEXA SCAN  09/23/2019  . HEMOGLOBIN A1C  10/20/2019  . FOOT EXAM  10/22/2019  . MAMMOGRAM  10/26/2019  . TETANUS/TDAP  11/30/2021  . INFLUENZA VACCINE  Completed  . PNA vac Low Risk Adult  Completed       Assessment  This is a routine wellness examination for Katie [name].  Health Maintenance: Due or Overdue Health Maintenance Due  Topic Date Due  . OPHTHALMOLOGY EXAM  01/07/2016  . COLONOSCOPY  05/28/2017    Katie [name] does not need a referral for Community Assistance: Care Management:   no Social Work:    no Prescription Assistance:  no Nutrition/Diabetes Education:  no   Plan:  Personalized Goals Goals Addressed            This Visit's Progress   . DIET - EAT MORE FRUITS AND VEGETABLES   On track   . Exercise 150 minutes per week (moderate activity)   Not on track   . Prevent falls       Walk and stay active  Personalized Health Maintenance & Screening Recommendations  HM up to date.  Lung Cancer Screening Recommended: no (Low Dose CT Chest recommended if Age 18-80 years, 30 pack-year currently smoking OR have quit w/in past 15 years) Hepatitis C Screening recommended: no HIV Screening recommended: no  Advanced Directives: Written information was not prepared per patient's request.  Referrals & Orders No orders of the defined types were placed in this encounter.   Follow-up Plan . Follow-up with Katie Pretty, FNP as planned    I have personally reviewed and noted the following in the patient's chart:   . Medical and social history . Use of alcohol, tobacco or illicit drugs  . Current medications and supplements . Functional ability and status . Nutritional status . Physical activity . Advanced directives . List of other physicians . Hospitalizations, surgeries, and  ER visits in previous 12 months . Vitals . Screenings to include cognitive, depression, and falls . Referrals and appointments  In addition, I have reviewed and discussed with Katie [name] certain preventive protocols, quality metrics, and best practice recommendations. A written personalized care plan for preventive services as well as general preventive health recommendations is available and can be mailed to the patient at her request.      Carlyn Lemke, [place]oud  05/09/2019   I have reviewed and agree with the above AWV documentation.   Evelina Dun, FNP

## 2019-05-10 ENCOUNTER — Other Ambulatory Visit: Payer: Self-pay | Admitting: Nurse Practitioner

## 2019-05-10 DIAGNOSIS — M8580 Other specified disorders of bone density and structure, unspecified site: Secondary | ICD-10-CM

## 2019-05-10 LAB — CUP PACEART REMOTE DEVICE CHECK
Date Time Interrogation Session: 20200921144123
Implantable Pulse Generator Implant Date: 20200819

## 2019-05-10 MED ORDER — TRAMADOL HCL 50 MG PO TABS: 50.0000 mg | ORAL_TABLET | Freq: Every day | ORAL | 0 refills | Status: DC

## 2019-05-10 NOTE — Telephone Encounter (Signed)
utram refilled needs to be seen for future refills

## 2019-05-16 ENCOUNTER — Telehealth: Payer: Self-pay

## 2019-05-16 ENCOUNTER — Other Ambulatory Visit: Payer: Self-pay | Admitting: Nurse Practitioner

## 2019-05-16 DIAGNOSIS — R609 Edema, unspecified: Secondary | ICD-10-CM

## 2019-05-16 NOTE — Telephone Encounter (Signed)
Refills sent.  Will need to be seen for further refills.

## 2019-05-16 NOTE — Progress Notes (Signed)
Carelink Summary Report / Loop Recorder 

## 2019-05-16 NOTE — Telephone Encounter (Signed)
Left a message regarding appt on 05/18/19.

## 2019-05-17 ENCOUNTER — Ambulatory Visit
Admission: RE | Admit: 2019-05-17 | Discharge: 2019-05-17 | Disposition: A | Discharge status: Home / Self Care | Payer: Medicare Other | Source: Ambulatory Visit | Attending: Specialist | Admitting: Specialist

## 2019-05-17 ENCOUNTER — Other Ambulatory Visit: Payer: Self-pay

## 2019-05-17 DIAGNOSIS — M4316 Spondylolisthesis, lumbar region: Secondary | ICD-10-CM

## 2019-05-17 DIAGNOSIS — M48062 Spinal stenosis, lumbar region with neurogenic claudication: Secondary | ICD-10-CM

## 2019-05-18 ENCOUNTER — Telehealth (INDEPENDENT_AMBULATORY_CARE_PROVIDER_SITE_OTHER): Payer: Medicare Other | Admitting: Internal Medicine

## 2019-05-18 DIAGNOSIS — I10 Essential (primary) hypertension: Secondary | ICD-10-CM | POA: Diagnosis not present

## 2019-05-18 DIAGNOSIS — I5032 Chronic diastolic (congestive) heart failure: Secondary | ICD-10-CM | POA: Diagnosis not present

## 2019-05-18 DIAGNOSIS — I480 Paroxysmal atrial fibrillation: Secondary | ICD-10-CM

## 2019-05-18 DIAGNOSIS — I483 Typical atrial flutter: Secondary | ICD-10-CM

## 2019-05-18 NOTE — Progress Notes (Signed)
Electrophysiology TeleHealth Note   Due to national recommendations of social distancing due to COVID 19, an audio/video telehealth visit is felt to be most appropriate for this patient at this time.  See MyChart message from today for the patient's consent to telehealth for [facility].   Date:  05/18/2019   ID:  Katie [name], DOB [birth year], MRN [id number]  Location: patient's home  Provider location:  [place]  Evaluation Performed: Follow-up visit  PCP:  [name], FNP   Electrophysiologist:  Dr Rayann Heman  Chief Complaint:  palpitations  History of Present Illness:    Katie [name] is a 85 y.o. female who presents via telehealth conferencing today.  Since last being seen in our clinic, the patient reports doing very well.  Today, she denies symptoms of palpitations, chest pain, shortness of breath,  lower extremity edema, dizziness, presyncope, or syncope.  The patient is otherwise without complaint today.  The patient denies symptoms of fevers, chills, cough, or new SOB worrisome for COVID 19.  Past Medical History:  Diagnosis Date  . Aortic insufficiency    a. Trivial AI by echo 02/2016.  [name] Atrial fibrillation and flutter ([place])    a. Coarse afib vs flutter by EKG 12/2015.  . Cataract   . Chronic diastolic CHF (congestive heart failure) ([place])   . CKD (chronic kidney disease), stage III ([place])   . Edema   . GERD (gastroesophageal reflux disease)   . Hiatal hernia   . Hypercholesterolemia   . Hypertension   . Hypokalemia   . Hypothyroidism   . Left knee pain   . NIDDM (non-insulin dependent diabetes mellitus)    diet controlled   . Obesity   . Premature atrial contractions   . PVC's (premature ventricular contractions)   . URI (upper respiratory infection)   . Vitamin D deficiency     Past Surgical History:  Procedure Laterality Date  . A-FLUTTER ABLATION N/A 02/08/2019   Procedure: A-FLUTTER ABLATION;  Surgeon: Thompson Grayer, MD;  Location:  [facility];  Service: Cardiovascular;  Laterality: N/A;  . ABDOMINAL HYSTERECTOMY    . APPENDECTOMY  1980  . BACK SURGERY    . CATARACT EXTRACTION, BILATERAL    . CHOLECYSTECTOMY  5/00  . COLONOSCOPY    . implantable loop recorder placement  04/06/2019   MDT Reveal LINQ [id number]) implanted for evaluation of palpitations and afib post atrial flutter ablation by Dr Rayann Heman in office  . TONSILLECTOMY    . TOTAL ABDOMINAL HYSTERECTOMY W/ BILATERAL SALPINGOOPHORECTOMY  1980  . UPPER GASTROINTESTINAL ENDOSCOPY      Current Outpatient Medications  Medication Sig Dispense Refill  . benazepril (LOTENSIN) 40 MG tablet TAKE 1 AND 1/2 TABLETS BY  MOUTH ONCE DAILY 135 tablet 1  . clonazePAM (KLONOPIN) 0.5 MG tablet Take 1 tablet (0.5 mg total) by mouth 2 (two) times daily as needed for anxiety. 30 tablet 1  . cloNIDine (CATAPRES) 0.1 MG tablet Take 1 tablet (0.1 mg total) by mouth 2 (two) times daily. 180 tablet 1  . furosemide (LASIX) 40 MG tablet TAKE 1 TABLET BY MOUTH IN  THE MORNING DAILY AND 1/2  TABLET IN THE AFTERNOON 135 tablet 1  . gabapentin (NEURONTIN) 100 MG capsule Take 1 capsule (100 mg total) by mouth at bedtime. 30 capsule 3  . HYDROcodone-acetaminophen (NORCO/VICODIN) 5-325 MG tablet Take 1 tablet by mouth every 6 (six) hours as needed for moderate pain. 30 tablet 0  . levothyroxine (  SYNTHROID) 50 MCG tablet TAKE 1 TABLET BY MOUTH  DAILY BEFORE BREAKFAST 90 tablet 2  . loratadine (CLARITIN) 10 MG tablet Take 10 mg by mouth daily as needed for allergies.    [name] lovastatin (MEVACOR) 40 MG tablet Take 2 tablets (80 mg total) by mouth at bedtime. 180 tablet 1  . omeprazole (PRILOSEC) 40 MG capsule TAKE 1 CAPSULE BY MOUTH  DAILY 90 capsule 3  . potassium chloride SA (K-DUR) 20 MEQ tablet TAKE 1 TABLET BY MOUTH TWO  TIMES DAILY 180 tablet 1  . Propylene Glycol (SYSTANE BALANCE) 0.6 % SOLN Place 1 drop into both eyes daily as needed (dry eyes).    . raloxifene (EVISTA) 60 MG  tablet TAKE 1 TABLET BY MOUTH  DAILY 90 tablet 1  . Rivaroxaban (XARELTO) 15 MG TABS tablet TAKE 1 TABLET BY MOUTH ONCE DAILY WITH SUPPER (Patient taking differently: Take 15 mg by mouth daily with supper. TAKE 1 TABLET BY MOUTH ONCE DAILY WITH SUPPER) 90 tablet 1  . traMADol (ULTRAM) 50 MG tablet Take 1 tablet (50 mg total) by mouth daily. TAKE 1 TABLET BY MOUTH TWICE DAILY AS NEEDED FOR MODERATE PAIN OR SEVERE PAIN 30 tablet 0   No current facility-administered medications for this visit.     Allergies:   Amlodipine, Macrodantin, Metformin and related, and Penicillins   Social History:  The patient  reports that she has never smoked. She has never used smokeless tobacco. She reports that she does not drink alcohol or use drugs.   Family History:  The patient's family history includes Atrial fibrillation in her sister; Colon cancer in her sister; Dementia in her brother; Diabetes in her brother, mother, sister, sister, sister, and son; Healthy in her son; Heart disease in her father; Liver cancer in her sister; Ovarian cancer in her sister; Stroke in her father and mother; Uterine cancer in her sister.   ROS:  Please see the history of present illness.   All other systems are personally reviewed and negative.    Exam:    Vital Signs:  There were no vitals taken for this visit.  Well sounding and appearing, alert and conversant, regular work of breathing,  good skin color Eyes- anicteric, neuro- grossly intact, skin- no apparent rash or lesions or cyanosis, mouth- oral mucosa is pink  Labs/Other Tests and Data Reviewed:    Recent Labs: 01/28/2019: B Natriuretic Peptide 627.0; Magnesium 2.0; TSH 2.659 02/04/2019: Hemoglobin 13.3; Platelets 214 04/22/2019: ALT 8; BUN 17; Creatinine, Ser 1.33; Potassium 4.4; Sodium 141   Wt Readings from Last 3 Encounters:  05/09/19 170 lb (77.1 kg)  04/22/19 170 lb (77.1 kg)  04/07/19 164 lb (74.4 kg)     Last device remote is reviewed from [facility] PDF  which reveals normal device function, no arrhythmias   ASSESSMENT & PLAN:    1.  Atrial arrhythmias S/p CTI ablation for atrial flutter Carries a diagnosis of afib, though this has not been well documented. She has ILR in place which has not documented afib. She has a chads2vasc score of 5.   I worry about long term exposure to xarelto without appropriate AFib detection.  We weill therefore stop anticoagulation today and restart if AF is detected on ILR.  2. HTN Stable No change required today  3. Chronic diastolic dysfunction Stable No change required today   Follow-up:  carelink Return to see EP PA in 6 months   Patient Risk:  after full review of this patients clinical  status, I feel that they are at moderate risk at this time.  Today, I have spent 15 minutes with the patient with telehealth technology discussing arrhythmia management .    Army Fossa, MD  05/18/2019 2:04 PM     [facility] [address] [place]  [id number] [phone] (office) [phone] (fax)

## 2019-05-27 ENCOUNTER — Ambulatory Visit: Payer: Medicare Other | Admitting: Specialist

## 2019-06-08 ENCOUNTER — Telehealth: Payer: Self-pay | Admitting: *Deleted

## 2019-06-08 DIAGNOSIS — I480 Paroxysmal atrial fibrillation: Secondary | ICD-10-CM

## 2019-06-08 MED ORDER — RIVAROXABAN 15 MG PO TABS: 15.0000 mg | ORAL_TABLET | Freq: Every day | ORAL | 11 refills | Status: DC

## 2019-06-08 NOTE — Telephone Encounter (Signed)
The pt returning [place] call. The best phone number for her is [phone].

## 2019-06-08 NOTE — Telephone Encounter (Signed)
Spoke with patient to advise her of Dr. Jackalyn Lombard recommendations. She is agreeable to restarting Xarelto 15mg  daily, reports she still has some at home. Reviewed side effects/signs of bleeding. Requests prescription be sent into [facility] as a 30 day supply. She is aware that her next f/u will be due in 10/2019, agrees to call back sooner if any questions or concerns.  Pt has upcoming dental appointment, may need tooth extraction. Advised to let dentist know she is on Xarelto. Pt verbalizes understanding, agrees to have dentist call our office if further instructions needed.

## 2019-06-08 NOTE — Telephone Encounter (Signed)
LMOM requesting call back to [place]. Direct number and office hours given.  LINQ alert received for AF episode on 06/07/19 at 06:22, 47min duration. Reviewed with Dr. Rayann Heman, recommended restarting Xarelto 15mg  daily, f/u with EP APP in 10/2019 as previously instructed.

## 2019-06-11 ENCOUNTER — Other Ambulatory Visit: Payer: Self-pay | Admitting: Nurse Practitioner

## 2019-06-11 DIAGNOSIS — E782 Mixed hyperlipidemia: Secondary | ICD-10-CM

## 2019-06-12 LAB — CUP PACEART REMOTE DEVICE CHECK
Date Time Interrogation Session: 20201024144959
Implantable Pulse Generator Implant Date: 20200819

## 2019-06-13 ENCOUNTER — Ambulatory Visit (INDEPENDENT_AMBULATORY_CARE_PROVIDER_SITE_OTHER): Payer: Medicare Other | Admitting: *Deleted

## 2019-06-13 DIAGNOSIS — I4892 Unspecified atrial flutter: Secondary | ICD-10-CM | POA: Diagnosis not present

## 2019-06-13 DIAGNOSIS — I5032 Chronic diastolic (congestive) heart failure: Secondary | ICD-10-CM

## 2019-06-21 ENCOUNTER — Ambulatory Visit (INDEPENDENT_AMBULATORY_CARE_PROVIDER_SITE_OTHER): Payer: Medicare Other | Admitting: Nurse Practitioner

## 2019-06-21 ENCOUNTER — Other Ambulatory Visit: Payer: Self-pay

## 2019-06-21 ENCOUNTER — Encounter: Payer: Self-pay | Admitting: Nurse Practitioner

## 2019-06-21 DIAGNOSIS — J0100 Acute maxillary sinusitis, unspecified: Secondary | ICD-10-CM | POA: Diagnosis not present

## 2019-06-21 MED ORDER — DOXYCYCLINE HYCLATE 100 MG PO TABS: 100.0000 mg | ORAL_TABLET | Freq: Two times a day (BID) | ORAL | 0 refills | Status: DC

## 2019-06-21 MED ORDER — FLUTICASONE PROPIONATE 50 MCG/ACT NA SUSP: 2.0000 | Freq: Every day | NASAL | 6 refills | Status: DC

## 2019-06-21 NOTE — Progress Notes (Signed)
Virtual Visit via telephone Note Due to COVID-19 pandemic this visit was conducted virtually. This visit type was conducted due to national recommendations for restrictions regarding the COVID-19 Pandemic (e.g. social distancing, sheltering in place) in an effort to limit this patient's exposure and mitigate transmission in our community. All issues noted in this document were discussed and addressed.  A physical exam was not performed with this format.  I connected with Katie [name] on 06/21/19 at 2:05 by telephone and verified that I am speaking with the correct person using two identifiers. Katie [name] is currently located at home and no one is currently with  her during visit. The provider, Mary-Margaret Hassell Done, FNP is located in their office at time of visit.  I discussed the limitations, risks, security and privacy concerns of performing an evaluation and management service by telephone and the availability of in person appointments. I also discussed with the patient that there may be a patient responsible charge related to this service. The patient expressed understanding and agreed to proceed.   History and Present Illness:   Chief Complaint: Sinusitis   HPI Patient calls in c/o sinus congestion with facial pressure. Started 1 week  Go and now has headache. Has had no covid exposure that she is aware.   Review of Systems  Constitutional: Negative for chills and fever.  HENT: Positive for congestion, ear pain and sinus pain.   Respiratory: Positive for cough.   Cardiovascular: Negative.   Genitourinary: Negative.   Neurological: Positive for headaches.  Psychiatric/Behavioral: Negative.   All other systems reviewed and are negative.    Observations/Objective: Alert and oriented- answers all questions appropriately No distress Voice hoarse No cough or SOB noted  Assessment and Plan: Katie [name] in today with chief complaint of Sinusitis   1. Acute non-recurrent  maxillary sinusitis 1. Take meds as prescribed 2. Use a cool mist humidifier especially during the winter months and when heat has been humid. 3. Use saline nose sprays frequently 4. Saline irrigations of the nose can be very helpful if done frequently.  * 4X daily for 1 week*  * Use of a nettie pot can be helpful with this. Follow directions with this* 5. Drink plenty of fluids 6. Keep thermostat turn down low 7.For any cough or congestion  Use plain Mucinex- regular strength or max strength is fine   * Children- consult with Pharmacist for dosing 8. For fever or aces or pains- take tylenol or ibuprofen appropriate for age and weight.  * for fevers greater than 101 orally you may alternate ibuprofen and tylenol every  3 hours.    - doxycycline (VIBRA-TABS) 100 MG tablet; Take 1 tablet (100 mg total) by mouth 2 (two) times daily. 1 po bid  Dispense: 14 tablet; Refill: 0 - fluticasone (FLONASE) 50 MCG/ACT nasal spray; Place 2 sprays into both nostrils daily.  Dispense: 16 g; Refill: 6   Follow Up Instructions: prn    I discussed the assessment and treatment plan with the patient. The patient was provided an opportunity to ask questions and all were answered. The patient agreed with the plan and demonstrated an understanding of the instructions.   The patient was advised to call back or seek an in-person evaluation if the symptoms worsen or if the condition fails to improve as anticipated.  The above assessment and management plan was discussed with the patient. The patient verbalized understanding of and has agreed to the management plan. Patient is aware  to call the clinic if symptoms persist or worsen. Patient is aware when to return to the clinic for a follow-up visit. Patient educated on when it is appropriate to go to the emergency department.   Time call ended:  2:17  I provided 12 minutes of non-face-to-face time during this encounter.    Mary-Margaret Hassell Done, FNP

## 2019-06-22 ENCOUNTER — Ambulatory Visit: Payer: Medicare Other | Admitting: Specialist

## 2019-06-27 ENCOUNTER — Telehealth: Payer: Self-pay | Admitting: Nurse Practitioner

## 2019-06-27 ENCOUNTER — Other Ambulatory Visit: Payer: Self-pay | Admitting: Nurse Practitioner

## 2019-06-27 DIAGNOSIS — M8580 Other specified disorders of bone density and structure, unspecified site: Secondary | ICD-10-CM

## 2019-06-27 DIAGNOSIS — I10 Essential (primary) hypertension: Secondary | ICD-10-CM

## 2019-06-27 DIAGNOSIS — R609 Edema, unspecified: Secondary | ICD-10-CM

## 2019-06-27 MED ORDER — LEVOTHYROXINE SODIUM 50 MCG PO TABS: ORAL_TABLET | ORAL | 2 refills | Status: DC

## 2019-06-27 MED ORDER — BENAZEPRIL HCL 40 MG PO TABS: 60.0000 mg | ORAL_TABLET | Freq: Every day | ORAL | 1 refills | Status: DC

## 2019-06-27 MED ORDER — FUROSEMIDE 40 MG PO TABS: ORAL_TABLET | ORAL | 1 refills | Status: DC

## 2019-06-27 MED ORDER — RALOXIFENE HCL 60 MG PO TABS: 60.0000 mg | ORAL_TABLET | Freq: Every day | ORAL | 1 refills | Status: DC

## 2019-06-27 NOTE — Telephone Encounter (Signed)
What is the name of the medication? levothyroxine (SYNTHROID) 50 MCG tablet raloxifene (EVISTA) 60 MG tablet lodine 400 MG furosemide (LASIX) 40 MG tablet benazepril (LOTENSIN) 40 MG tablet  Have you contacted your pharmacy to request a refill? no  Which pharmacy would you like this sent to? [facility] mail order   Patient notified that their request is being sent to the clinical staff for review and that they should receive a call once it is complete. If they do not receive a call within 24 hours they can check with their pharmacy or our office.

## 2019-06-27 NOTE — Telephone Encounter (Signed)
meds sent in

## 2019-07-01 ENCOUNTER — Telehealth: Payer: Self-pay | Admitting: Nurse Practitioner

## 2019-07-01 NOTE — Telephone Encounter (Signed)
Pt advised has to be seen for any refills on pain medications per office policy/protocol and pt was scheduled with MMM for 11/16 at 11.

## 2019-07-01 NOTE — Progress Notes (Signed)
Carelink Summary Report / Loop Recorder 

## 2019-07-04 ENCOUNTER — Ambulatory Visit (INDEPENDENT_AMBULATORY_CARE_PROVIDER_SITE_OTHER): Payer: Medicare Other | Admitting: Nurse Practitioner

## 2019-07-04 ENCOUNTER — Encounter: Payer: Self-pay | Admitting: Nurse Practitioner

## 2019-07-04 DIAGNOSIS — E119 Type 2 diabetes mellitus without complications: Secondary | ICD-10-CM

## 2019-07-04 DIAGNOSIS — Z6826 Body mass index (BMI) 26.0-26.9, adult: Secondary | ICD-10-CM

## 2019-07-04 DIAGNOSIS — M545 Low back pain, unspecified: Secondary | ICD-10-CM

## 2019-07-04 DIAGNOSIS — E876 Hypokalemia: Secondary | ICD-10-CM

## 2019-07-04 DIAGNOSIS — E032 Hypothyroidism due to medicaments and other exogenous substances: Secondary | ICD-10-CM

## 2019-07-04 DIAGNOSIS — I5032 Chronic diastolic (congestive) heart failure: Secondary | ICD-10-CM

## 2019-07-04 DIAGNOSIS — G8929 Other chronic pain: Secondary | ICD-10-CM

## 2019-07-04 DIAGNOSIS — F5101 Primary insomnia: Secondary | ICD-10-CM

## 2019-07-04 DIAGNOSIS — I4819 Other persistent atrial fibrillation: Secondary | ICD-10-CM

## 2019-07-04 DIAGNOSIS — N1832 Chronic kidney disease, stage 3b: Secondary | ICD-10-CM

## 2019-07-04 DIAGNOSIS — R609 Edema, unspecified: Secondary | ICD-10-CM

## 2019-07-04 DIAGNOSIS — I10 Essential (primary) hypertension: Secondary | ICD-10-CM | POA: Diagnosis not present

## 2019-07-04 DIAGNOSIS — R600 Localized edema: Secondary | ICD-10-CM

## 2019-07-04 DIAGNOSIS — K219 Gastro-esophageal reflux disease without esophagitis: Secondary | ICD-10-CM

## 2019-07-04 DIAGNOSIS — M8580 Other specified disorders of bone density and structure, unspecified site: Secondary | ICD-10-CM

## 2019-07-04 DIAGNOSIS — E785 Hyperlipidemia, unspecified: Secondary | ICD-10-CM

## 2019-07-04 MED ORDER — CLONIDINE HCL 0.1 MG PO TABS: 0.1000 mg | ORAL_TABLET | Freq: Two times a day (BID) | ORAL | 1 refills | Status: DC

## 2019-07-04 MED ORDER — GABAPENTIN 100 MG PO CAPS: 100.0000 mg | ORAL_CAPSULE | Freq: Every day | ORAL | 1 refills | Status: DC

## 2019-07-04 MED ORDER — PANTOPRAZOLE SODIUM 40 MG PO TBEC: 40.0000 mg | DELAYED_RELEASE_TABLET | Freq: Every day | ORAL | 3 refills | Status: DC

## 2019-07-04 MED ORDER — TRAMADOL HCL 50 MG PO TABS: 50.0000 mg | ORAL_TABLET | Freq: Every day | ORAL | 0 refills | Status: DC

## 2019-07-04 NOTE — Progress Notes (Signed)
Virtual Visit via telephone Note Due to COVID-19 pandemic this visit was conducted virtually. This visit type was conducted due to national recommendations for restrictions regarding the COVID-19 Pandemic (e.g. social distancing, sheltering in place) in an effort to limit this patient's exposure and mitigate transmission in our community. All issues noted in this document were discussed and addressed.  A physical exam was not performed with this format.  I connected with Katie [name] on 07/04/19 at 10:30 by telephone and verified that I am speaking with the correct person using two identifiers. Katie [name] is currently located at home and no one is currently with her during visit. The provider, Mary-Margaret Hassell Done, FNP is located in their office at time of visit.  I discussed the limitations, risks, security and privacy concerns of performing an evaluation and management service by telephone and the availability of in person appointments. I also discussed with the patient that there may be a patient responsible charge related to this service. The patient expressed understanding and agreed to proceed.   History and Present Illness:   Chief Complaint: Medical Management of Chronic Issues    HPI:  1. Essential hypertension No c/o chest pain, sob or headache. Does not check blood pressure at home. BP Readings from Last 3 Encounters:  05/09/19 (!) 148/69  04/22/19 (!) 148/69  04/07/19 (!) 152/90     2. Chronic diastolic CHF (congestive heart failure) (HCC) She has daily swelling of lower ext but will usually resolve at night. Denies SOB.  3. Persistent atrial fibrillation ([facility]) Is on xeralto daily withpt any bleeding issues. SHe deneis any palpitations.  4. Gastroesophageal reflux disease without esophagitis Takes omeprazole daily and has not been working for her lately. She says she is nauseous.  5. Hypothyroidism due to non-medication exogenous substances No problems that she  is aware of. Lab Results  Component Value Date   TSH 2.659 01/28/2019     6. Diabetes mellitus type 2, diet-controlled ([place]) Does not check blood sugars at home. . Lab Results  Component Value Date   HGBA1C 7.2 (H) 04/22/2019     7. Stage 3b chronic kidney disease No problems voiding. Will recheck labs.  8. Hyperlipidemia, unspecified hyperlipidemia type Does not watch diet and does no exercise. She says that se does stay very busy and active around her home.  9. Hypokalemia No c/o lower ext cramping  10. Peripheral edema As stated previously she has swelling st the end of the day that resolves some at night  11. Primary insomnia takes klonopin at night to sleep. Has been taking fo rseveral years. She worries a lot at night and this helps her relax.  12. BMI 26.0-26.9,adult No recent weight changes Wt Readings from Last 3 Encounters:  05/09/19 170 lb (77.1 kg)  04/22/19 170 lb (77.1 kg)  04/07/19 164 lb (74.4 kg)   BMI Readings from Last 3 Encounters:  05/09/19 26.63 kg/m  04/22/19 26.63 kg/m  04/07/19 25.69 kg/m       Outpatient Encounter Medications as of 07/04/2019  Medication Sig   benazepril (LOTENSIN) 40 MG tablet Take 1.5 tablets (60 mg total) by mouth daily.   clonazePAM (KLONOPIN) 0.5 MG tablet Take 1 tablet (0.5 mg total) by mouth 2 (two) times daily as needed for anxiety.   cloNIDine (CATAPRES) 0.1 MG tablet Take 1 tablet (0.1 mg total) by mouth 2 (two) times daily.   doxycycline (VIBRA-TABS) 100 MG tablet Take 1 tablet (100 mg total) by mouth 2 (  two) times daily. 1 po bid   fluticasone (FLONASE) 50 MCG/ACT nasal spray Place 2 sprays into both nostrils daily.   furosemide (LASIX) 40 MG tablet TAKE 1 TABLET BY MOUTH IN  THE MORNING DAILY AND 1/2  TABLET IN THE AFTERNOON   gabapentin (NEURONTIN) 100 MG capsule Take 1 capsule (100 mg total) by mouth at bedtime.   HYDROcodone-acetaminophen (NORCO/VICODIN) 5-325 MG tablet Take 1 tablet by mouth  every 6 (six) hours as needed for moderate pain.   levothyroxine (SYNTHROID) 50 MCG tablet TAKE 1 TABLET BY MOUTH  DAILY BEFORE BREAKFAST   loratadine (CLARITIN) 10 MG tablet Take 10 mg by mouth daily as needed for allergies.   lovastatin (MEVACOR) 40 MG tablet TAKE 2 TABLETS BY MOUTH AT  BEDTIME   omeprazole (PRILOSEC) 40 MG capsule TAKE 1 CAPSULE BY MOUTH  DAILY   potassium chloride SA (K-DUR) 20 MEQ tablet TAKE 1 TABLET BY MOUTH TWO  TIMES DAILY   Propylene Glycol (SYSTANE BALANCE) 0.6 % SOLN Place 1 drop into both eyes daily as needed (dry eyes).   raloxifene (EVISTA) 60 MG tablet Take 1 tablet (60 mg total) by mouth daily.   Rivaroxaban (XARELTO) 15 MG TABS tablet Take 1 tablet (15 mg total) by mouth daily with supper.   traMADol (ULTRAM) 50 MG tablet Take 1 tablet (50 mg total) by mouth daily. TAKE 1 TABLET BY MOUTH TWICE DAILY AS NEEDED FOR MODERATE PAIN OR SEVERE PAIN     Past Surgical History:  Procedure Laterality Date   A-FLUTTER ABLATION N/A 02/08/2019   Procedure: A-FLUTTER ABLATION;  Surgeon: Thompson Grayer, MD;  Location: [facility];  Service: Cardiovascular;  Laterality: N/A;   ABDOMINAL HYSTERECTOMY     APPENDECTOMY  1980   BACK SURGERY     CATARACT EXTRACTION, BILATERAL     CHOLECYSTECTOMY  5/00   COLONOSCOPY     implantable loop recorder placement  04/06/2019   MDT Reveal LINQ [id number]) implanted for evaluation of palpitations and afib post atrial flutter ablation by Dr Rayann Heman in office   TONSILLECTOMY     TOTAL ABDOMINAL HYSTERECTOMY W/ BILATERAL SALPINGOOPHORECTOMY  1980   UPPER GASTROINTESTINAL ENDOSCOPY      Family History  Problem Relation Age of Onset   Diabetes Mother    Stroke Mother    Heart disease Father    Stroke Father    Uterine cancer Sister    Diabetes Sister    Ovarian cancer Sister    Colon cancer Sister    Diabetes Sister    Liver cancer Sister        \   Diabetes Brother    Dementia  Brother    Atrial fibrillation Sister    Diabetes Sister    Diabetes Son    Healthy Son    Heart attack Neg Hx    Hypertension Neg Hx     New complaints: She is still having back pain and she takes her ultram 1x a day. She says th tramadol helps.  Social history: Lives by herself since her husband passed away  Controlled substance contract: 04/06/18- will have up dated at next face to face visit    Review of Systems  Constitutional: Negative for diaphoresis and weight loss.  Eyes: Negative for blurred vision, double vision and pain.  Respiratory: Negative for shortness of breath.   Cardiovascular: Negative for chest pain, palpitations, orthopnea and leg swelling.  Gastrointestinal: Positive for heartburn (worsening) and nausea. Negative for abdominal pain.  Musculoskeletal: Positive for back pain.  Skin: Negative for rash.  Neurological: Negative for dizziness, sensory change, loss of consciousness, weakness and headaches.  Endo/Heme/Allergies: Negative for polydipsia. Does not bruise/bleed easily.  Psychiatric/Behavioral: Negative for memory loss. The patient does not have insomnia.   All other systems reviewed and are negative.    Observations/Objective: Alert and oriented- answers all questions appropriately No distress    Assessment and Plan: Katie [name] comes in today with chief complaint of Medical Management of Chronic Issues   Diagnosis and orders addressed:  1. Essential hypertension Low sodium diet - cloNIDine (CATAPRES) 0.1 MG tablet; Take 1 tablet (0.1 mg total) by mouth 2 (two) times daily.  Dispense: 180 tablet; Refill: 1  2. Chronic diastolic CHF (congestive heart failure) (HCC) Do not over do fluid intake  3. Persistent atrial fibrillation ([facility]) Continue xeralto  4. Gastroesophageal reflux disease without esophagitis Avoid spicy foods Do not eat 2 hours prior to bedtime Stop omeprazole- changed to protonix - Ambulatory referral to  Gastroenterology - pantoprazole (PROTONIX) 40 MG tablet; Take 1 tablet (40 mg total) by mouth daily.  Dispense: 30 tablet; Refill: 3  5. Hypothyroidism due to non-medication exogenous substances Will repeat labs at next visit  6. Diabetes mellitus type 2, diet-controlled ([place]) watch carbs in diet  7. Stage 3b chronic kidney disease Will do lab work at next visit  8. Hyperlipidemia, unspecified hyperlipidemia type Low fat diet Exercise encouraged  9. Hypokalemia  10. Peripheral edema elevate legs when sitting  11. Primary insomnia Bedtime routine  12. BMI 26.0-26.9,adult Discussed diet and exercise for person with BMI >25 Will recheck weight in 3-6 months  13. Back pain Back stretches - traMADol (ULTRAM) 50 MG tablet; Take 1 tablet (50 mg total) by mouth daily. TAKE 1 TABLET BY MOUTH TWICE DAILY AS NEEDED FOR MODERATE PAIN OR SEVERE PAIN  Dispense: 30 tablet; Refill: 0   Labs pending Health Maintenance reviewed Diet and exercise encouraged  Follow up plan: 3 months     I discussed the assessment and treatment plan with the patient. The patient was provided an opportunity to ask questions and all were answered. The patient agreed with the plan and demonstrated an understanding of the instructions.   The patient was advised to call back or seek an in-person evaluation if the symptoms worsen or if the condition fails to improve as anticipated.  The above assessment and management plan was discussed with the patient. The patient verbalized understanding of and has agreed to the management plan. Patient is aware to call the clinic if symptoms persist or worsen. Patient is aware when to return to the clinic for a follow-up visit. Patient educated on when it is appropriate to go to the emergency department.   Time call ended:  10:46  I provided 16 minutes of non-face-to-face time during this encounter.    Mary-Margaret Hassell Done, FNP

## 2019-07-06 ENCOUNTER — Ambulatory Visit: Payer: Medicare Other | Admitting: Specialist

## 2019-07-12 ENCOUNTER — Telehealth: Payer: Self-pay | Admitting: Gastroenterology

## 2019-07-12 DIAGNOSIS — R1310 Dysphagia, unspecified: Secondary | ICD-10-CM

## 2019-07-12 NOTE — Telephone Encounter (Signed)
Thank you for this message  Please order a single contrast DG esophagus re: dysphagia  Ask them to give a tablet.

## 2019-07-12 NOTE — Telephone Encounter (Signed)
The pt has an appt with Colleen on 12/1 for reflux and trouble swallowing.  Per the pt husband she is able to swallow her meds and drink water/fluids.  She has been eating soft foods but feels as if she is choking when she swallows.  She saw her PCP last week who changed her PPI to Protonix 40 mg daily.  There are no sooner appt;s available. I advised that she avoid meats and tough foods.  She was advised to stay on full liquids and I will forward to Dr Carlean Purl for further req's.  Dr Carlean Purl any further recommendations?

## 2019-07-12 NOTE — Telephone Encounter (Signed)
Pt's husband called requesting a sooner appt for pt. He stated that her sxs have worsened and she is not able to keep anything down. Her appt is on 12/1 with Colleen. Pls call him .

## 2019-07-12 NOTE — Telephone Encounter (Signed)
The pt's son has been advised of the appt and instructions.  He is aware that the information is also on My Chart.  The pt has been advised of the information and verbalized understanding.      You have been scheduled for a Barium Esophogram at [facility] (1st floor of the hospital) on 07/18/19 at 1030 am. Please arrive 15 minutes prior to your appointment for registration. Make certain not to have anything to eat or drink 3 hours prior to your test. If you need to reschedule for any reason, please contact radiology at [phone] to do so. _____________________________________________________________ A barium swallow is an examination that concentrates on views of the esophagus. This tends to be a double contrast exam (barium and two liquids which, when combined, create a gas to distend the wall of the oesophagus) or single contrast (non-ionic iodine based). The study is usually tailored to your symptoms so a good history is essential. Attention is paid during the study to the form, structure and configuration of the esophagus, looking for functional disorders (such as aspiration, dysphagia, achalasia, motility and reflux) EXAMINATION You may be asked to change into a gown, depending on the type of swallow being performed. A radiologist and radiographer will perform the procedure. The radiologist will advise you of the type of contrast selected for your procedure and direct you during the exam. You will be asked to stand, sit or lie in several different positions and to hold a small amount of fluid in your mouth before being asked to swallow while the imaging is performed .In some instances you may be asked to swallow barium coated marshmallows to assess the motility of a solid food bolus. The exam can be recorded as a digital or video fluoroscopy procedure. POST PROCEDURE It will take 1-2 days for the barium to pass through your system. To facilitate this, it is important, unless otherwise  directed, to increase your fluids for the next 24-48hrs and to resume your normal diet.  This test typically takes about 30 minutes to perform. _____________________________________________________________

## 2019-07-15 LAB — CUP PACEART REMOTE DEVICE CHECK
Date Time Interrogation Session: 20201126094551
Implantable Pulse Generator Implant Date: 20200819

## 2019-07-18 ENCOUNTER — Other Ambulatory Visit: Payer: Self-pay

## 2019-07-18 ENCOUNTER — Ambulatory Visit (HOSPITAL_COMMUNITY)
Admission: RE | Admit: 2019-07-18 | Discharge: 2019-07-18 | Disposition: A | Discharge status: Home / Self Care | Payer: Medicare Other | Source: Ambulatory Visit | Attending: Internal Medicine | Admitting: Internal Medicine

## 2019-07-18 ENCOUNTER — Ambulatory Visit (INDEPENDENT_AMBULATORY_CARE_PROVIDER_SITE_OTHER): Payer: Medicare Other | Admitting: *Deleted

## 2019-07-18 DIAGNOSIS — R1310 Dysphagia, unspecified: Secondary | ICD-10-CM | POA: Insufficient documentation

## 2019-07-18 DIAGNOSIS — I4819 Other persistent atrial fibrillation: Secondary | ICD-10-CM

## 2019-07-19 ENCOUNTER — Encounter: Payer: Self-pay | Admitting: Nurse Practitioner

## 2019-07-19 ENCOUNTER — Ambulatory Visit: Payer: Medicare Other | Admitting: Nurse Practitioner

## 2019-07-19 VITALS — BP 120/62 | HR 59 | Temp 97.4°F | Ht 67.0 in | Wt 164.0 lb

## 2019-07-19 DIAGNOSIS — R1110 Vomiting, unspecified: Secondary | ICD-10-CM

## 2019-07-19 DIAGNOSIS — K224 Dyskinesia of esophagus: Secondary | ICD-10-CM | POA: Diagnosis not present

## 2019-07-19 DIAGNOSIS — K2100 Gastro-esophageal reflux disease with esophagitis, without bleeding: Secondary | ICD-10-CM

## 2019-07-19 MED ORDER — FAMOTIDINE 20 MG PO TABS: 20.0000 mg | ORAL_TABLET | Freq: Every day | ORAL | 2 refills | Status: DC

## 2019-07-19 NOTE — Progress Notes (Addendum)
07/19/2019 MARYBELLA CRAN [id number] [birth year]   HISTORY OF PRESENT ILLNESS: Adrian Pruski is an 85 year old female with a past medical history of hypertension, atrial fibrillation on Xarelto, aortic insufficiency, CHF-D, PVCs, CKD stage III, DM II, hypothyroidism and GERD with regurgitation. Past cholecystectomy. She has a loop recorder with plans to have this removed in the near future. She presents today with complaints of worsening reflux symptoms. She describes her reflux symptoms as food backs up into the esophagus, feels choked. She has regurgitation of food during the day, worse at nighttime. She eats dinner around 5:30pm and goes to bed around 9pm or 11pm. She awakened at 3:30am with mild nausea twice over the past week. No significant heartburn. No chest, upper or lower abdominal pain. She has chronic constipation. If she takes Miralax daily she has loose stools. No rectal bleeding or melena. She was taking Omeprazole 40mg  once daily and her PCP switched her to Protonix 40mg  QD 2 to 3 weeks ago. She stated her symptoms have slightly improved since taking Protonix. She underwent a barium swallow 07/18/2019 which showed marked esophageal dysmotility with a small hiatal hernia and reflux. She worries about her risk of having cancer. She had 3 sisters who died from cancer, one with liver cancer, one with ovarian cancer with metastasis to the colon and one with bladder cancer.   CBC Latest Ref Rng & Units 02/04/2019 01/28/2019 01/07/2019  WBC 3.4 - 10.8 x10E3/uL 11.0(H) 12.3(H) 7.0  Hemoglobin 11.1 - 15.9 g/dL 13.3 13.5 11.7  Hematocrit 34.0 - 46.6 % 37.3 40.3 33.5(L)  Platelets 150 - 450 x10E3/uL 214 232 211   CMP Latest Ref Rng & Units 04/22/2019 02/04/2019 01/28/2019  Glucose 65 - 99 mg/dL 140(H) 170(H) 144(H)  BUN 8 - 27 mg/dL 17 46(H) 33(H)  Creatinine 0.57 - 1.00 mg/dL 1.33(H) 1.88(H) 1.60(H)  Sodium 134 - 144 mmol/L 141 137 139  Potassium 3.5 - 5.2 mmol/L 4.4 4.5 4.0  Chloride 96 - 106  mmol/L 102 98 101  CO2 20 - 29 mmol/L 26 23 24   Calcium 8.7 - 10.3 mg/dL 9.0 9.7 9.5  Total Protein 6.0 - 8.5 g/dL 6.0 - 6.8  Total Bilirubin 0.0 - 1.2 mg/dL 0.3 - 0.6  Alkaline Phos 39 - 117 IU/L 82 - 56  AST 0 - 40 IU/L 13 - 17  ALT 0 - 32 IU/L 8 - 15    Barium Swallow 07/18/2019: Marked esophageal dysmotility with small hiatal hernia and reflux as described. No signs of stricture or lesion in the esophagus on evaluation with thin barium.  EGD 09/24/2005: Normal. Suspected nonerosive reflux disease -Reglan 5mg  PRN for regurgitations prescribed   Colonoscopy 05/28/2012: 2 diminutive sessile polyps (sessile serrated/tubular adenoma) were found at the cecum Moderate diverticulosis noted in the sigmoid colon Moderate size internal hemorrhoids  -No recall colonoscopy due to age   Colonoscopy 09/24/2005: no polyps, sigmoid fecalith, internal hemorrhoids    Past Medical History:  Diagnosis Date  . Aortic insufficiency    a. Trivial AI by echo 02/2016.  [name] Atrial fibrillation and flutter ([place])    a. Coarse afib vs flutter by EKG 12/2015.  . Cataract   . Chronic diastolic CHF (congestive heart failure) ([place])   . CKD (chronic kidney disease), stage III   . Edema   . GERD (gastroesophageal reflux disease)   . Hiatal hernia   . Hypercholesterolemia   . Hypertension   . Hypokalemia   . Hypothyroidism   .  Left knee pain   . NIDDM (non-insulin dependent diabetes mellitus)    diet controlled   . Obesity   . Premature atrial contractions   . PVC's (premature ventricular contractions)   . URI (upper respiratory infection)   . Vitamin D deficiency    Past Surgical History:  Procedure Laterality Date  . A-FLUTTER ABLATION N/A 02/08/2019   Procedure: A-FLUTTER ABLATION;  Surgeon: Thompson Grayer, MD;  Location: [place] [facility];  Service: Cardiovascular;  Laterality: N/A;  . ABDOMINAL HYSTERECTOMY    . APPENDECTOMY  1980  . BACK SURGERY    . CATARACT EXTRACTION, BILATERAL    .  CHOLECYSTECTOMY  5/00  . COLONOSCOPY    . implantable loop recorder placement  04/06/2019   MDT Reveal LINQ [id number]) implanted for evaluation of palpitations and afib post atrial flutter ablation by Dr Rayann Heman in office  . TONSILLECTOMY    . TOTAL ABDOMINAL HYSTERECTOMY W/ BILATERAL SALPINGOOPHORECTOMY  1980  . UPPER GASTROINTESTINAL ENDOSCOPY      reports that she has never smoked. She has never used smokeless tobacco. She reports that she does not drink alcohol or use drugs. family history includes Atrial fibrillation in her sister; Colon cancer in her sister; Dementia in her brother; Diabetes in her brother, mother, sister, sister, sister, and son; Healthy in her son; Heart disease in her father; Liver cancer in her sister; Ovarian cancer in her sister; Stroke in her father and mother; Uterine cancer in her sister. Allergies  Allergen Reactions  . Amlodipine Swelling  . Macrodantin Nausea And Vomiting  . Metformin And Related Nausea And Vomiting and Other (See Comments)    Bloating  . Penicillins Rash    Did it involve swelling of the face/tongue/throat, SOB, or low BP? Unknown Did it involve sudden or severe rash/hives, skin peeling, or any reaction on the inside of your mouth or nose? Yes Did you need to seek medical attention at a hospital or doctor's office? Yes When did it last happen? 2005  If all above answers are "NO", may proceed with cephalosporin use.       Outpatient Encounter Medications as of 07/19/2019  Medication Sig  . benazepril (LOTENSIN) 40 MG tablet Take 1.5 tablets (60 mg total) by mouth daily.  . clonazePAM (KLONOPIN) 0.5 MG tablet Take 1 tablet (0.5 mg total) by mouth 2 (two) times daily as needed for anxiety. (Patient taking differently: Take 0.25 mg by mouth at bedtime. )  . cloNIDine (CATAPRES) 0.1 MG tablet Take 1 tablet (0.1 mg total) by mouth 2 (two) times daily.  . fluticasone (FLONASE) 50 MCG/ACT nasal spray Place 2 sprays into both nostrils  daily.  . furosemide (LASIX) 40 MG tablet TAKE 1 TABLET BY MOUTH IN  THE MORNING DAILY AND 1/2  TABLET IN THE AFTERNOON  . levothyroxine (SYNTHROID) 50 MCG tablet TAKE 1 TABLET BY MOUTH  DAILY BEFORE BREAKFAST  . loratadine (CLARITIN) 10 MG tablet Take 10 mg by mouth daily as needed for allergies.  [name] lovastatin (MEVACOR) 40 MG tablet TAKE 2 TABLETS BY MOUTH AT  BEDTIME  . pantoprazole (PROTONIX) 40 MG tablet Take 1 tablet (40 mg total) by mouth daily.  . potassium chloride SA (K-DUR) 20 MEQ tablet TAKE 1 TABLET BY MOUTH TWO  TIMES DAILY  . Propylene Glycol (SYSTANE BALANCE) 0.6 % SOLN Place 1 drop into both eyes daily as needed (dry eyes).  . raloxifene (EVISTA) 60 MG tablet Take 1 tablet (60 mg total) by mouth daily.  [name]  Rivaroxaban (XARELTO) 15 MG TABS tablet Take 1 tablet (15 mg total) by mouth daily with supper.  . traMADol (ULTRAM) 50 MG tablet Take 1 tablet (50 mg total) by mouth daily. TAKE 1 TABLET BY MOUTH TWICE DAILY AS NEEDED FOR MODERATE PAIN OR SEVERE PAIN  . [DISCONTINUED] gabapentin (NEURONTIN) 100 MG capsule Take 1 capsule (100 mg total) by mouth at bedtime.   No facility-administered encounter medications on file as of 07/19/2019.    Family History  Problem Relation Age of Onset  . Diabetes Mother   . Stroke Mother   . Heart disease Father   . Stroke Father   . Uterine cancer Sister   . Diabetes Sister   . Ovarian cancer Sister   . Colon cancer Sister   . Diabetes Sister   . Liver cancer Sister        \  . Diabetes Brother   . Dementia Brother   . Atrial fibrillation Sister   . Diabetes Sister   . Diabetes Son   . Healthy Son   . Heart attack Neg Hx   . Hypertension Neg Hx    Social History   Socioeconomic History  . Marital status: Widowed    Spouse name: Not on file  . Number of children: 2  . Years of education: Not on file  . Highest education level: High school graduate  Occupational History  . Occupation: Retired    Comment: Teacher, adult education  , [place], [place]   Social Needs  . Financial resource strain: Somewhat hard  . Food insecurity    Worry: Never true    Inability: Never true  . Transportation needs    Medical: No    Non-medical: No  Tobacco Use  . Smoking status: Never Smoker  . Smokeless tobacco: Never Used  Substance and Sexual Activity  . Alcohol use: No  . Drug use: No  . Sexual activity: Not Currently  Lifestyle  . Physical activity    Days per week: 0 days    Minutes per session: 0 min  . Stress: Not at all  Relationships  . Social connections    Talks on phone: More than three times a week    Gets together: More than three times a week    Attends religious service: Never    Active member of club or organization: No    Attends meetings of clubs or organizations: Never    Relationship status: Married  . Intimate partner violence    Fear of current or ex partner: No    Emotionally abused: Not on file    Physically abused: No    Forced sexual activity: No  Other Topics Concern  . Not on file  Social History Narrative  . Not on file   REVIEW OF SYSTEMS  : All other systems reviewed and negative except where noted in the History of Present Illness.  PHYSICAL EXAM: BP 120/62   Pulse (!) 59   Temp (!) 97.4 F (36.3 C)   Ht 5\' 7"  (1.702 m)   Wt 164 lb (74.4 kg)   BMI 25.69 kg/m  General: 85 year old white female in no acute distress Head: Normocephalic and atraumatic Eyes:  Sclerae anicteric,conjunctive pink. Ears: Normal auditory acuity Mouth: Several missing teeth, no ulcers or lesions Neck: Supple, no masses.  Lungs: Clear throughout to auscultation Heart: Regular rate and rhythm Abdomen: Soft, nontender, non distended. No masses or hepatomegaly noted. Normal bowel sounds x 4 quads.  Rectal: Deferred.  Musculoskeletal: Symmetrical with no gross deformities  Skin: No lesions on visible extremities Extremities: Trace lower extremity edema  Neurological: Alert oriented x 4, grossly nonfocal  Cervical Nodes:  No significant cervical adenopathy Psychological:  Alert and cooperative. Normal mood and affect  ASSESSMENT AND PLAN:  45. 85 year old female with GERD and regurgitation. Barium swallow done 11/30 showed marked esophageal dysmotility, GERD and a small hiatal hernia.  -I discussed scheduling esophageal manometry and possible EGD, further recommendations per Dr. Carlean Purl -Her symptoms have slightly improved since Omeprazole was changed Protonix, add Famotidine 20mg  one tab at bedtime, to consider brief course of Reglan if her symptoms worsen as she has used Reglan in the past without adverse response  2. History of colon polyps -no further colonoscopies due to age  74. Atrial fibrillation on Xarelto   4. Renal insufficiency   5. DM II, diet controlled    CC:  Martin, Mary-Margaret, *  Chart, note and ba swallow reviewed.  I am not inclined to have her do an EGD or manometry at thius time. I do not think this is due to cancer (she fears)  I recommend continue PPI and H2 blocker  Would schedule a f/u me for January  Gatha Mayer, MD, [facility]

## 2019-07-19 NOTE — Patient Instructions (Signed)
If you are age 83 or older, your body mass index should be between 23-30. Your Body mass index is 25.69 kg/m. If this is out of the aforementioned range listed, please consider follow up with your Primary Care Provider.  If you are age 59 or younger, your body mass index should be between 19-25. Your Body mass index is 25.69 kg/m. If this is out of the aformentioned range listed, please consider follow up with your Primary Care Provider.   We have sent the following medications to your pharmacy for you to pick up at your convenience:  Famotidine 20 mg 1 tablet at bedtime. Continue taking your Protonix 40mg  1 capsule in the morning  Jaclyn Shaggy will contact you with Dr Marla Roe recommendation.  Thank you for choosing me and [facility]

## 2019-07-20 DIAGNOSIS — R1110 Vomiting, unspecified: Secondary | ICD-10-CM | POA: Insufficient documentation

## 2019-07-21 ENCOUNTER — Telehealth (INDEPENDENT_AMBULATORY_CARE_PROVIDER_SITE_OTHER): Payer: Self-pay | Admitting: Nurse Practitioner

## 2019-07-21 NOTE — Telephone Encounter (Signed)
Left message for patient to call back to the office;  

## 2019-07-21 NOTE — Telephone Encounter (Signed)
Bre, please call the patient and let her know I communicated with Dr. Carlean Purl and he wants her to continue with Protonix 40mg  in the am and Famotidine 20mg  at bed time. No plans for esophageal manometry or EGD. Please schedule her for a follow up appointment with Dr.Gessner in Jan. 2021. thx

## 2019-07-21 NOTE — Telephone Encounter (Signed)
Patient returned call to the office-patient informed of provider's recommendations-patient has been scheduled for f/u OV on 08/30/2019 at 3:30 pm; Patient advised to call back to the office at [phone] should questions/concerns arise;  Patient verbalized understanding of information/instructions;

## 2019-07-21 NOTE — Progress Notes (Signed)
Reviewed Suggest stay on medication and see me in January Do not favor EGD or manometry at this time No signs of cancer here. See addendum for visit w/ Ms. Berniece Pap

## 2019-07-26 ENCOUNTER — Ambulatory Visit: Payer: Self-pay | Admitting: Nurse Practitioner

## 2019-08-03 DIAGNOSIS — E785 Hyperlipidemia, unspecified: Secondary | ICD-10-CM | POA: Diagnosis not present

## 2019-08-03 DIAGNOSIS — U071 COVID-19: Secondary | ICD-10-CM | POA: Diagnosis not present

## 2019-08-03 DIAGNOSIS — R1013 Epigastric pain: Secondary | ICD-10-CM | POA: Diagnosis not present

## 2019-08-03 DIAGNOSIS — E039 Hypothyroidism, unspecified: Secondary | ICD-10-CM | POA: Diagnosis not present

## 2019-08-03 DIAGNOSIS — K219 Gastro-esophageal reflux disease without esophagitis: Secondary | ICD-10-CM | POA: Diagnosis not present

## 2019-08-03 DIAGNOSIS — I4819 Other persistent atrial fibrillation: Secondary | ICD-10-CM | POA: Diagnosis not present

## 2019-08-03 DIAGNOSIS — I5032 Chronic diastolic (congestive) heart failure: Secondary | ICD-10-CM | POA: Diagnosis not present

## 2019-08-03 DIAGNOSIS — R112 Nausea with vomiting, unspecified: Secondary | ICD-10-CM | POA: Diagnosis not present

## 2019-08-03 DIAGNOSIS — I130 Hypertensive heart and chronic kidney disease with heart failure and stage 1 through stage 4 chronic kidney disease, or unspecified chronic kidney disease: Secondary | ICD-10-CM | POA: Diagnosis not present

## 2019-08-03 DIAGNOSIS — N183 Chronic kidney disease, stage 3 unspecified: Secondary | ICD-10-CM | POA: Insufficient documentation

## 2019-08-03 DIAGNOSIS — Z1159 Encounter for screening for other viral diseases: Secondary | ICD-10-CM | POA: Diagnosis not present

## 2019-08-03 DIAGNOSIS — N179 Acute kidney failure, unspecified: Secondary | ICD-10-CM | POA: Diagnosis not present

## 2019-08-03 DIAGNOSIS — I771 Stricture of artery: Secondary | ICD-10-CM | POA: Diagnosis not present

## 2019-08-03 DIAGNOSIS — Z7901 Long term (current) use of anticoagulants: Secondary | ICD-10-CM | POA: Diagnosis not present

## 2019-08-03 DIAGNOSIS — J9811 Atelectasis: Secondary | ICD-10-CM | POA: Diagnosis not present

## 2019-08-03 DIAGNOSIS — R1310 Dysphagia, unspecified: Secondary | ICD-10-CM | POA: Diagnosis not present

## 2019-08-03 DIAGNOSIS — N3000 Acute cystitis without hematuria: Secondary | ICD-10-CM | POA: Diagnosis not present

## 2019-08-03 DIAGNOSIS — N1832 Chronic kidney disease, stage 3b: Secondary | ICD-10-CM | POA: Diagnosis not present

## 2019-08-03 DIAGNOSIS — B9620 Unspecified Escherichia coli [E. coli] as the cause of diseases classified elsewhere: Secondary | ICD-10-CM | POA: Diagnosis not present

## 2019-08-03 DIAGNOSIS — E1122 Type 2 diabetes mellitus with diabetic chronic kidney disease: Secondary | ICD-10-CM | POA: Diagnosis not present

## 2019-08-03 DIAGNOSIS — R109 Unspecified abdominal pain: Secondary | ICD-10-CM | POA: Diagnosis not present

## 2019-08-04 DIAGNOSIS — E1122 Type 2 diabetes mellitus with diabetic chronic kidney disease: Secondary | ICD-10-CM | POA: Diagnosis not present

## 2019-08-04 DIAGNOSIS — E785 Hyperlipidemia, unspecified: Secondary | ICD-10-CM | POA: Diagnosis not present

## 2019-08-04 DIAGNOSIS — I4819 Other persistent atrial fibrillation: Secondary | ICD-10-CM | POA: Diagnosis not present

## 2019-08-04 DIAGNOSIS — R112 Nausea with vomiting, unspecified: Secondary | ICD-10-CM | POA: Diagnosis not present

## 2019-08-04 DIAGNOSIS — U071 COVID-19: Secondary | ICD-10-CM | POA: Diagnosis not present

## 2019-08-04 DIAGNOSIS — I5032 Chronic diastolic (congestive) heart failure: Secondary | ICD-10-CM | POA: Diagnosis not present

## 2019-08-04 DIAGNOSIS — N3000 Acute cystitis without hematuria: Secondary | ICD-10-CM | POA: Diagnosis not present

## 2019-08-04 DIAGNOSIS — K219 Gastro-esophageal reflux disease without esophagitis: Secondary | ICD-10-CM | POA: Diagnosis not present

## 2019-08-04 DIAGNOSIS — Z7901 Long term (current) use of anticoagulants: Secondary | ICD-10-CM | POA: Diagnosis not present

## 2019-08-04 DIAGNOSIS — I130 Hypertensive heart and chronic kidney disease with heart failure and stage 1 through stage 4 chronic kidney disease, or unspecified chronic kidney disease: Secondary | ICD-10-CM | POA: Diagnosis not present

## 2019-08-04 DIAGNOSIS — N1832 Chronic kidney disease, stage 3b: Secondary | ICD-10-CM | POA: Diagnosis not present

## 2019-08-04 DIAGNOSIS — E039 Hypothyroidism, unspecified: Secondary | ICD-10-CM | POA: Diagnosis not present

## 2019-08-04 DIAGNOSIS — B9620 Unspecified Escherichia coli [E. coli] as the cause of diseases classified elsewhere: Secondary | ICD-10-CM | POA: Diagnosis not present

## 2019-08-04 DIAGNOSIS — N179 Acute kidney failure, unspecified: Secondary | ICD-10-CM | POA: Diagnosis not present

## 2019-08-04 DIAGNOSIS — R1310 Dysphagia, unspecified: Secondary | ICD-10-CM | POA: Diagnosis not present

## 2019-08-05 DIAGNOSIS — E039 Hypothyroidism, unspecified: Secondary | ICD-10-CM | POA: Diagnosis not present

## 2019-08-05 DIAGNOSIS — K219 Gastro-esophageal reflux disease without esophagitis: Secondary | ICD-10-CM | POA: Diagnosis not present

## 2019-08-05 DIAGNOSIS — I130 Hypertensive heart and chronic kidney disease with heart failure and stage 1 through stage 4 chronic kidney disease, or unspecified chronic kidney disease: Secondary | ICD-10-CM | POA: Diagnosis not present

## 2019-08-05 DIAGNOSIS — B9620 Unspecified Escherichia coli [E. coli] as the cause of diseases classified elsewhere: Secondary | ICD-10-CM | POA: Diagnosis not present

## 2019-08-05 DIAGNOSIS — Z7901 Long term (current) use of anticoagulants: Secondary | ICD-10-CM | POA: Diagnosis not present

## 2019-08-05 DIAGNOSIS — N1832 Chronic kidney disease, stage 3b: Secondary | ICD-10-CM | POA: Diagnosis not present

## 2019-08-05 DIAGNOSIS — N179 Acute kidney failure, unspecified: Secondary | ICD-10-CM | POA: Diagnosis not present

## 2019-08-05 DIAGNOSIS — I4819 Other persistent atrial fibrillation: Secondary | ICD-10-CM | POA: Diagnosis not present

## 2019-08-05 DIAGNOSIS — I5032 Chronic diastolic (congestive) heart failure: Secondary | ICD-10-CM | POA: Diagnosis not present

## 2019-08-05 DIAGNOSIS — R1310 Dysphagia, unspecified: Secondary | ICD-10-CM | POA: Diagnosis not present

## 2019-08-05 DIAGNOSIS — U071 COVID-19: Secondary | ICD-10-CM | POA: Diagnosis not present

## 2019-08-05 DIAGNOSIS — N3000 Acute cystitis without hematuria: Secondary | ICD-10-CM | POA: Diagnosis not present

## 2019-08-05 DIAGNOSIS — R112 Nausea with vomiting, unspecified: Secondary | ICD-10-CM | POA: Diagnosis not present

## 2019-08-05 DIAGNOSIS — E785 Hyperlipidemia, unspecified: Secondary | ICD-10-CM | POA: Diagnosis not present

## 2019-08-05 DIAGNOSIS — E1122 Type 2 diabetes mellitus with diabetic chronic kidney disease: Secondary | ICD-10-CM | POA: Diagnosis not present

## 2019-08-06 DIAGNOSIS — R1310 Dysphagia, unspecified: Secondary | ICD-10-CM | POA: Diagnosis not present

## 2019-08-06 DIAGNOSIS — I4819 Other persistent atrial fibrillation: Secondary | ICD-10-CM | POA: Diagnosis not present

## 2019-08-06 DIAGNOSIS — R112 Nausea with vomiting, unspecified: Secondary | ICD-10-CM | POA: Diagnosis not present

## 2019-08-06 DIAGNOSIS — E785 Hyperlipidemia, unspecified: Secondary | ICD-10-CM | POA: Diagnosis not present

## 2019-08-06 DIAGNOSIS — U071 COVID-19: Secondary | ICD-10-CM | POA: Diagnosis not present

## 2019-08-06 DIAGNOSIS — N3000 Acute cystitis without hematuria: Secondary | ICD-10-CM | POA: Diagnosis not present

## 2019-08-06 DIAGNOSIS — Z7901 Long term (current) use of anticoagulants: Secondary | ICD-10-CM | POA: Diagnosis not present

## 2019-08-06 DIAGNOSIS — I130 Hypertensive heart and chronic kidney disease with heart failure and stage 1 through stage 4 chronic kidney disease, or unspecified chronic kidney disease: Secondary | ICD-10-CM | POA: Diagnosis not present

## 2019-08-06 DIAGNOSIS — I5032 Chronic diastolic (congestive) heart failure: Secondary | ICD-10-CM | POA: Diagnosis not present

## 2019-08-06 DIAGNOSIS — E1122 Type 2 diabetes mellitus with diabetic chronic kidney disease: Secondary | ICD-10-CM | POA: Diagnosis not present

## 2019-08-06 DIAGNOSIS — B9620 Unspecified Escherichia coli [E. coli] as the cause of diseases classified elsewhere: Secondary | ICD-10-CM | POA: Diagnosis not present

## 2019-08-06 DIAGNOSIS — N179 Acute kidney failure, unspecified: Secondary | ICD-10-CM | POA: Diagnosis not present

## 2019-08-06 DIAGNOSIS — E039 Hypothyroidism, unspecified: Secondary | ICD-10-CM | POA: Diagnosis not present

## 2019-08-06 DIAGNOSIS — N1832 Chronic kidney disease, stage 3b: Secondary | ICD-10-CM | POA: Diagnosis not present

## 2019-08-06 DIAGNOSIS — K219 Gastro-esophageal reflux disease without esophagitis: Secondary | ICD-10-CM | POA: Diagnosis not present

## 2019-08-08 ENCOUNTER — Ambulatory Visit: Payer: Medicare Other | Admitting: Specialist

## 2019-08-09 DIAGNOSIS — I5032 Chronic diastolic (congestive) heart failure: Secondary | ICD-10-CM | POA: Diagnosis not present

## 2019-08-09 DIAGNOSIS — I4819 Other persistent atrial fibrillation: Secondary | ICD-10-CM | POA: Diagnosis not present

## 2019-08-09 DIAGNOSIS — E860 Dehydration: Secondary | ICD-10-CM | POA: Diagnosis not present

## 2019-08-09 DIAGNOSIS — R531 Weakness: Secondary | ICD-10-CM | POA: Diagnosis not present

## 2019-08-09 DIAGNOSIS — K224 Dyskinesia of esophagus: Secondary | ICD-10-CM | POA: Diagnosis not present

## 2019-08-09 DIAGNOSIS — N1832 Chronic kidney disease, stage 3b: Secondary | ICD-10-CM | POA: Diagnosis not present

## 2019-08-09 DIAGNOSIS — N179 Acute kidney failure, unspecified: Secondary | ICD-10-CM | POA: Diagnosis not present

## 2019-08-09 DIAGNOSIS — E1122 Type 2 diabetes mellitus with diabetic chronic kidney disease: Secondary | ICD-10-CM | POA: Diagnosis not present

## 2019-08-09 DIAGNOSIS — K219 Gastro-esophageal reflux disease without esophagitis: Secondary | ICD-10-CM | POA: Diagnosis not present

## 2019-08-09 DIAGNOSIS — Z7901 Long term (current) use of anticoagulants: Secondary | ICD-10-CM | POA: Diagnosis not present

## 2019-08-09 DIAGNOSIS — I130 Hypertensive heart and chronic kidney disease with heart failure and stage 1 through stage 4 chronic kidney disease, or unspecified chronic kidney disease: Secondary | ICD-10-CM | POA: Diagnosis not present

## 2019-08-09 DIAGNOSIS — E039 Hypothyroidism, unspecified: Secondary | ICD-10-CM | POA: Diagnosis not present

## 2019-08-09 DIAGNOSIS — R1310 Dysphagia, unspecified: Secondary | ICD-10-CM | POA: Diagnosis not present

## 2019-08-09 DIAGNOSIS — Z1612 Extended spectrum beta lactamase (ESBL) resistance: Secondary | ICD-10-CM | POA: Diagnosis not present

## 2019-08-09 DIAGNOSIS — E785 Hyperlipidemia, unspecified: Secondary | ICD-10-CM | POA: Diagnosis not present

## 2019-08-09 DIAGNOSIS — N390 Urinary tract infection, site not specified: Secondary | ICD-10-CM | POA: Diagnosis not present

## 2019-08-09 DIAGNOSIS — E876 Hypokalemia: Secondary | ICD-10-CM | POA: Diagnosis not present

## 2019-08-09 DIAGNOSIS — B9620 Unspecified Escherichia coli [E. coli] as the cause of diseases classified elsewhere: Secondary | ICD-10-CM | POA: Diagnosis not present

## 2019-08-09 DIAGNOSIS — D61818 Other pancytopenia: Secondary | ICD-10-CM | POA: Diagnosis not present

## 2019-08-09 DIAGNOSIS — U071 COVID-19: Secondary | ICD-10-CM | POA: Diagnosis not present

## 2019-08-09 DIAGNOSIS — Z79899 Other long term (current) drug therapy: Secondary | ICD-10-CM | POA: Diagnosis not present

## 2019-08-10 DIAGNOSIS — B9620 Unspecified Escherichia coli [E. coli] as the cause of diseases classified elsewhere: Secondary | ICD-10-CM | POA: Diagnosis not present

## 2019-08-10 DIAGNOSIS — U071 COVID-19: Secondary | ICD-10-CM | POA: Diagnosis not present

## 2019-08-10 DIAGNOSIS — N390 Urinary tract infection, site not specified: Secondary | ICD-10-CM | POA: Diagnosis not present

## 2019-08-10 DIAGNOSIS — N183 Chronic kidney disease, stage 3 unspecified: Secondary | ICD-10-CM | POA: Diagnosis not present

## 2019-08-10 DIAGNOSIS — N179 Acute kidney failure, unspecified: Secondary | ICD-10-CM | POA: Diagnosis not present

## 2019-08-10 NOTE — Progress Notes (Signed)
ILR remote 

## 2019-08-11 DIAGNOSIS — B9620 Unspecified Escherichia coli [E. coli] as the cause of diseases classified elsewhere: Secondary | ICD-10-CM | POA: Insufficient documentation

## 2019-08-11 DIAGNOSIS — N183 Chronic kidney disease, stage 3 unspecified: Secondary | ICD-10-CM | POA: Diagnosis not present

## 2019-08-11 DIAGNOSIS — N179 Acute kidney failure, unspecified: Secondary | ICD-10-CM | POA: Diagnosis not present

## 2019-08-11 DIAGNOSIS — N390 Urinary tract infection, site not specified: Secondary | ICD-10-CM | POA: Insufficient documentation

## 2019-08-11 DIAGNOSIS — U071 COVID-19: Secondary | ICD-10-CM | POA: Diagnosis not present

## 2019-08-12 DIAGNOSIS — N183 Chronic kidney disease, stage 3 unspecified: Secondary | ICD-10-CM | POA: Diagnosis not present

## 2019-08-12 DIAGNOSIS — B9620 Unspecified Escherichia coli [E. coli] as the cause of diseases classified elsewhere: Secondary | ICD-10-CM | POA: Diagnosis not present

## 2019-08-12 DIAGNOSIS — U071 COVID-19: Secondary | ICD-10-CM | POA: Diagnosis not present

## 2019-08-12 DIAGNOSIS — N390 Urinary tract infection, site not specified: Secondary | ICD-10-CM | POA: Diagnosis not present

## 2019-08-13 MED ORDER — LEVOTHYROXINE SODIUM 50 MCG PO TABS
50.00 | ORAL_TABLET | ORAL | Status: DC
Start: 2019-08-13 — End: 2019-08-13

## 2019-08-13 MED ORDER — GLUCOSE 40 % PO GEL
ORAL | Status: DC
Start: ? — End: 2019-08-13

## 2019-08-13 MED ORDER — DICYCLOMINE HCL 10 MG PO CAPS
10.00 | ORAL_CAPSULE | ORAL | Status: DC
Start: ? — End: 2019-08-13

## 2019-08-13 MED ORDER — METOPROLOL TARTRATE 25 MG PO TABS
12.50 | ORAL_TABLET | ORAL | Status: DC
Start: ? — End: 2019-08-13

## 2019-08-13 MED ORDER — RIVAROXABAN 15 MG PO TABS
15.00 | ORAL_TABLET | ORAL | Status: DC
Start: 2019-08-12 — End: 2019-08-13

## 2019-08-13 MED ORDER — ALBUTEROL SULFATE HFA 108 (90 BASE) MCG/ACT IN AERS
2.00 | INHALATION_SPRAY | RESPIRATORY_TRACT | Status: DC
Start: ? — End: 2019-08-13

## 2019-08-13 MED ORDER — BENAZEPRIL HCL 20 MG PO TABS
10.00 | ORAL_TABLET | ORAL | Status: DC
Start: 2019-08-13 — End: 2019-08-13

## 2019-08-13 MED ORDER — OXYCODONE HCL 5 MG PO TABS
5.00 | ORAL_TABLET | ORAL | Status: DC
Start: ? — End: 2019-08-13

## 2019-08-13 MED ORDER — INSULIN LISPRO 100 UNIT/ML ~~LOC~~ SOLN
0.00 | SUBCUTANEOUS | Status: DC
Start: 2019-08-12 — End: 2019-08-13

## 2019-08-13 MED ORDER — RALOXIFENE HCL 60 MG PO TABS
60.00 | ORAL_TABLET | ORAL | Status: DC
Start: 2019-08-13 — End: 2019-08-13

## 2019-08-13 MED ORDER — GENERIC EXTERNAL MEDICATION
50.00 | Status: DC
Start: ? — End: 2019-08-13

## 2019-08-13 MED ORDER — ACETAMINOPHEN 325 MG PO TABS
650.00 | ORAL_TABLET | ORAL | Status: DC
Start: ? — End: 2019-08-13

## 2019-08-13 MED ORDER — TRAMADOL HCL 50 MG PO TABS
25.00 | ORAL_TABLET | ORAL | Status: DC
Start: ? — End: 2019-08-13

## 2019-08-13 MED ORDER — CLONAZEPAM 0.5 MG PO TABS
0.25 | ORAL_TABLET | ORAL | Status: DC
Start: 2019-08-12 — End: 2019-08-13

## 2019-08-13 MED ORDER — MORPHINE SULFATE 2 MG/ML IJ SOLN
1.00 | INTRAMUSCULAR | Status: DC
Start: ? — End: 2019-08-13

## 2019-08-13 MED ORDER — GLUCOSE-VITAMIN C 4-6 GM-MG PO CHEW
12.00 | CHEWABLE_TABLET | ORAL | Status: DC
Start: ? — End: 2019-08-13

## 2019-08-13 MED ORDER — FAMOTIDINE 20 MG PO TABS
20.00 | ORAL_TABLET | ORAL | Status: DC
Start: 2019-08-12 — End: 2019-08-13

## 2019-08-13 MED ORDER — LABETALOL HCL 5 MG/ML IV SOLN
10.00 | INTRAVENOUS | Status: DC
Start: ? — End: 2019-08-13

## 2019-08-13 MED ORDER — FLUTICASONE PROPIONATE 50 MCG/ACT NA SUSP
2.00 | NASAL | Status: DC
Start: 2019-08-13 — End: 2019-08-13

## 2019-08-13 MED ORDER — GLUCAGON (RDNA) 1 MG IJ KIT
1.00 | PACK | INTRAMUSCULAR | Status: DC
Start: ? — End: 2019-08-13

## 2019-08-13 MED ORDER — PROMETHAZINE HCL 25 MG PO TABS
12.50 | ORAL_TABLET | ORAL | Status: DC
Start: ? — End: 2019-08-13

## 2019-08-13 MED ORDER — DEXTROSE 50 % IV SOLN
50.00 | INTRAVENOUS | Status: DC
Start: ? — End: 2019-08-13

## 2019-08-13 MED ORDER — CEPHALEXIN 500 MG PO CAPS
500.00 | ORAL_CAPSULE | ORAL | Status: DC
Start: 2019-08-12 — End: 2019-08-13

## 2019-08-13 MED ORDER — SACCHAROMYCES BOULARDII 250 MG PO CAPS
250.00 | ORAL_CAPSULE | ORAL | Status: DC
Start: 2019-08-12 — End: 2019-08-13

## 2019-08-13 MED ORDER — ONDANSETRON HCL 4 MG/2ML IJ SOLN
4.00 | INTRAMUSCULAR | Status: DC
Start: ? — End: 2019-08-13

## 2019-08-13 MED ORDER — CLONIDINE HCL 0.1 MG PO TABS
0.10 | ORAL_TABLET | ORAL | Status: DC
Start: 2019-08-12 — End: 2019-08-13

## 2019-08-13 MED ORDER — PANTOPRAZOLE SODIUM 40 MG PO TBEC
40.00 | DELAYED_RELEASE_TABLET | ORAL | Status: DC
Start: 2019-08-12 — End: 2019-08-13

## 2019-08-13 MED ORDER — LOVASTATIN 20 MG PO TABS
80.00 | ORAL_TABLET | ORAL | Status: DC
Start: 2019-08-12 — End: 2019-08-13

## 2019-08-14 DIAGNOSIS — N1832 Chronic kidney disease, stage 3b: Secondary | ICD-10-CM | POA: Diagnosis not present

## 2019-08-14 DIAGNOSIS — R109 Unspecified abdominal pain: Secondary | ICD-10-CM | POA: Diagnosis not present

## 2019-08-14 DIAGNOSIS — I5032 Chronic diastolic (congestive) heart failure: Secondary | ICD-10-CM | POA: Diagnosis not present

## 2019-08-14 DIAGNOSIS — E785 Hyperlipidemia, unspecified: Secondary | ICD-10-CM | POA: Diagnosis not present

## 2019-08-14 DIAGNOSIS — R531 Weakness: Secondary | ICD-10-CM | POA: Diagnosis not present

## 2019-08-14 DIAGNOSIS — K5909 Other constipation: Secondary | ICD-10-CM | POA: Diagnosis not present

## 2019-08-14 DIAGNOSIS — R000 Tachycardia, unspecified: Secondary | ICD-10-CM | POA: Diagnosis not present

## 2019-08-14 DIAGNOSIS — Z79891 Long term (current) use of opiate analgesic: Secondary | ICD-10-CM | POA: Diagnosis not present

## 2019-08-14 DIAGNOSIS — R7303 Prediabetes: Secondary | ICD-10-CM | POA: Diagnosis not present

## 2019-08-14 DIAGNOSIS — I130 Hypertensive heart and chronic kidney disease with heart failure and stage 1 through stage 4 chronic kidney disease, or unspecified chronic kidney disease: Secondary | ICD-10-CM | POA: Diagnosis not present

## 2019-08-14 DIAGNOSIS — I4819 Other persistent atrial fibrillation: Secondary | ICD-10-CM | POA: Diagnosis not present

## 2019-08-14 DIAGNOSIS — N3289 Other specified disorders of bladder: Secondary | ICD-10-CM | POA: Diagnosis not present

## 2019-08-14 DIAGNOSIS — K219 Gastro-esophageal reflux disease without esophagitis: Secondary | ICD-10-CM | POA: Diagnosis not present

## 2019-08-14 DIAGNOSIS — K5900 Constipation, unspecified: Secondary | ICD-10-CM | POA: Diagnosis not present

## 2019-08-14 DIAGNOSIS — Z7901 Long term (current) use of anticoagulants: Secondary | ICD-10-CM | POA: Diagnosis not present

## 2019-08-14 DIAGNOSIS — Z79899 Other long term (current) drug therapy: Secondary | ICD-10-CM | POA: Diagnosis not present

## 2019-08-14 DIAGNOSIS — E039 Hypothyroidism, unspecified: Secondary | ICD-10-CM | POA: Diagnosis not present

## 2019-08-14 DIAGNOSIS — U071 COVID-19: Secondary | ICD-10-CM | POA: Diagnosis not present

## 2019-08-14 DIAGNOSIS — R112 Nausea with vomiting, unspecified: Secondary | ICD-10-CM | POA: Diagnosis not present

## 2019-08-17 DIAGNOSIS — Z5321 Procedure and treatment not carried out due to patient leaving prior to being seen by health care provider: Secondary | ICD-10-CM | POA: Diagnosis not present

## 2019-08-18 ENCOUNTER — Ambulatory Visit (INDEPENDENT_AMBULATORY_CARE_PROVIDER_SITE_OTHER): Payer: Medicare Other | Admitting: *Deleted

## 2019-08-18 DIAGNOSIS — I4819 Other persistent atrial fibrillation: Secondary | ICD-10-CM

## 2019-08-18 LAB — CUP PACEART REMOTE DEVICE CHECK
Date Time Interrogation Session: 20201230232417
Implantable Pulse Generator Implant Date: 20200819

## 2019-08-19 NOTE — Progress Notes (Signed)
ILR remote 

## 2019-08-30 ENCOUNTER — Telehealth: Payer: Self-pay

## 2019-08-30 ENCOUNTER — Encounter: Payer: Self-pay | Admitting: Internal Medicine

## 2019-08-30 ENCOUNTER — Ambulatory Visit: Payer: Medicare Other | Admitting: Internal Medicine

## 2019-08-30 VITALS — BP 130/60 | HR 64 | Temp 98.1°F | Ht 66.25 in | Wt 161.4 lb

## 2019-08-30 DIAGNOSIS — I4891 Unspecified atrial fibrillation: Secondary | ICD-10-CM | POA: Diagnosis not present

## 2019-08-30 DIAGNOSIS — I4892 Unspecified atrial flutter: Secondary | ICD-10-CM

## 2019-08-30 DIAGNOSIS — R634 Abnormal weight loss: Secondary | ICD-10-CM

## 2019-08-30 DIAGNOSIS — Z7901 Long term (current) use of anticoagulants: Secondary | ICD-10-CM | POA: Diagnosis not present

## 2019-08-30 DIAGNOSIS — R1310 Dysphagia, unspecified: Secondary | ICD-10-CM | POA: Diagnosis not present

## 2019-08-30 DIAGNOSIS — R1110 Vomiting, unspecified: Secondary | ICD-10-CM

## 2019-08-30 DIAGNOSIS — K224 Dyskinesia of esophagus: Secondary | ICD-10-CM

## 2019-08-30 DIAGNOSIS — R1319 Other dysphagia: Secondary | ICD-10-CM

## 2019-08-30 DIAGNOSIS — Z8616 Personal history of COVID-19: Secondary | ICD-10-CM

## 2019-08-30 MED ORDER — AMBULATORY NON FORMULARY MEDICATION: 1 refills | Status: DC

## 2019-08-30 NOTE — Telephone Encounter (Signed)
[facility] Pre-operative Risk Assessment     Request for surgical clearance:     Endoscopy Procedure  What type of surgery is being performed?     EGD  When is this surgery scheduled?     09/13/2019  What type of clearance is required ?   Pharmacy  Are there any medications that need to be held prior to surgery and how long? Xarelto, 2 days  Practice name and name of physician performing surgery?      [facility]  What is your office phone and fax number?      Phone- [phone]  [phone]  Anesthesia type (None, local, MAC, general) ?      Propofol

## 2019-08-30 NOTE — Patient Instructions (Addendum)
You have been scheduled for an endoscopy. Please follow written instructions given to you at your visit today. If you use inhalers (even only as needed), please bring them with you on the day of your procedure.   You will be contaced by our office prior to your procedure for directions on holding your xarelto. If you do not hear from our office 1 week prior to your scheduled procedure, please call [phone] to discuss.  Due to recent changes in healthcare laws, you may see the results of your imaging and laboratory studies on MyChart before your provider has had a chance to review them.  We understand that in some cases there may be results that are confusing or concerning to you. Not all laboratory results come back in the same time frame and the provider may be waiting for multiple results in order to interpret others.  Please give [place] 48 hours in order for your provider to thoroughly review all the results before contacting the office for clarification of your results.    We are giving you a dysphagia 3 diet to read and follow.  We have faxed a GI cocktail rx to [facility] in [place].   I appreciate the opportunity to care for you. Silvano Rusk, MD, [facility]

## 2019-08-30 NOTE — Progress Notes (Signed)
Katie [name] 86 y.o. 23-Feb-1932 [id number]  Assessment & Plan:   Encounter Diagnoses  Name Primary?  . Esophageal dysmotility Yes  . Esophageal dysphagia   . Regurgitation of food   . Atrial fibrillation and flutter ([place])   . Long term current use of anticoagulant therapy - Xarelto   . Loss of weight   . History of COVID-19     I still think this is most likely a dysmotility issue but given the weight loss and the persistence of symptoms and the lack of response to acid suppression we will go ahead with an EGD.  Might need esophageal dilation.  Because of this would hold her Xarelto for 2 days and clarify with Dr. Rayann Heman.  I did explain that in addition to the usual risks of EGD and possible esophageal dilation there is a rare but real risk of stroke when off of her Xarelto but it for short period of time so very unlikely.  She has a history of Covid this fall so she does not need Covid testing.  I appreciate the opportunity to care for this patient. CC: [name], FNP    Subjective:   Chief Complaint: Regurgitation, esophageal dysmotility  HPI This is an 86 year old white woman with reflux symptoms mainly with regurgitation, liquids and solids, occurs soon after eating so not clear that the food even reaches her stomach at times.  She has not really tried to modify her diet but is eating less.  She had a barium swallow that showed tertiary contractions and poor clearance of liquid bolus a tablet passed without difficulty.  She saw  [facility] in early December and was treated with PPI, antacids/GI cocktail but that has not relieved her symptoms.  Her weight is down.  She recalls having similar problems years ago that "just went away".  She was struggling to get in to see [place] and actually went down to the [place] area where her son lives and was seen in an emergency department there.  Nothing further was done.   Wt Readings from Last 3 Encounters:    08/30/19 161 lb 6 oz (73.2 kg)  07/19/19 164 lb (74.4 kg)  05/09/19 170 lb (77.1 kg)     3 sisters died of cancer and she is fearful she could have cancer Allergies  Allergen Reactions  . Amlodipine Swelling  . Macrodantin Nausea And Vomiting  . Metformin And Related Nausea And Vomiting and Other (See Comments)    Bloating  . Penicillins Rash    Did it involve swelling of the face/tongue/throat, SOB, or low BP? Unknown Did it involve sudden or severe rash/hives, skin peeling, or any reaction on the inside of your mouth or nose? Yes Did you need to seek medical attention at a hospital or doctor's office? Yes When did it last happen? 2005  If all above answers are "NO", may proceed with cephalosporin use.    Current Meds  Medication Sig  . benazepril (LOTENSIN) 40 MG tablet Take 1.5 tablets (60 mg total) by mouth daily.  . clonazePAM (KLONOPIN) 0.5 MG tablet Take 1 tablet (0.5 mg total) by mouth 2 (two) times daily as needed for anxiety. (Patient taking differently: Take 0.25 mg by mouth at bedtime. )  . cloNIDine (CATAPRES) 0.1 MG tablet Take 1 tablet (0.1 mg total) by mouth 2 (two) times daily.  . fluticasone (FLONASE) 50 MCG/ACT nasal spray Place 2 sprays into both nostrils daily.  . furosemide (LASIX) 40 MG tablet TAKE  1 TABLET BY MOUTH IN  THE MORNING DAILY AND 1/2  TABLET IN THE AFTERNOON  . levothyroxine (SYNTHROID) 50 MCG tablet TAKE 1 TABLET BY MOUTH  DAILY BEFORE BREAKFAST  . loratadine (CLARITIN) 10 MG tablet Take 10 mg by mouth daily as needed for allergies.  [name] lovastatin (MEVACOR) 40 MG tablet TAKE 2 TABLETS BY MOUTH AT  BEDTIME  . pantoprazole (PROTONIX) 40 MG tablet Take 1 tablet (40 mg total) by mouth daily.  . potassium chloride SA (K-DUR) 20 MEQ tablet TAKE 1 TABLET BY MOUTH TWO  TIMES DAILY  . Propylene Glycol (SYSTANE BALANCE) 0.6 % SOLN Place 1 drop into both eyes daily as needed (dry eyes).  . raloxifene (EVISTA) 60 MG tablet Take 1 tablet (60 mg total) by  mouth daily.  . Rivaroxaban (XARELTO) 15 MG TABS tablet Take 1 tablet (15 mg total) by mouth daily with supper.  . traMADol (ULTRAM) 50 MG tablet Take 1 tablet (50 mg total) by mouth daily. TAKE 1 TABLET BY MOUTH TWICE DAILY AS NEEDED FOR MODERATE PAIN OR SEVERE PAIN  . [DISCONTINUED] Alum & Mag Hydroxide-Simeth (GI COCKTAIL) SUSP suspension TAKE 50MLS BY MOUTH ONCE FOR 1 DOSE OR AS DIRECTED. SHAKE WELL BEFORE USING   Past Medical History:  Diagnosis Date  . Aortic insufficiency    a. Trivial AI by echo 02/2016.  [name] Atrial fibrillation and flutter ([place])    a. Coarse afib vs flutter by EKG 12/2015.  . Cataract   . Chronic diastolic CHF (congestive heart failure) ([place])   . CKD (chronic kidney disease), stage III   . COVID-19   . Edema   . GERD (gastroesophageal reflux disease)   . Hiatal hernia   . Hypercholesterolemia   . Hypertension   . Hypokalemia   . Hypothyroidism   . Left knee pain   . NIDDM (non-insulin dependent diabetes mellitus)    diet controlled   . Obesity   . Premature atrial contractions   . PVC's (premature ventricular contractions)   . URI (upper respiratory infection)   . Vitamin D deficiency    Past Surgical History:  Procedure Laterality Date  . A-FLUTTER ABLATION N/A 02/08/2019   Procedure: A-FLUTTER ABLATION;  Surgeon: Thompson Grayer, MD;  Location: [place] [facility];  Service: Cardiovascular;  Laterality: N/A;  . ABDOMINAL HYSTERECTOMY    . APPENDECTOMY  1980  . BACK SURGERY    . CATARACT EXTRACTION, BILATERAL    . CHOLECYSTECTOMY  5/00  . COLONOSCOPY    . implantable loop recorder placement  04/06/2019   MDT Reveal LINQ [id number] [phone] [id number]) implanted for evaluation of palpitations and afib post atrial flutter ablation by Dr Rayann Heman in office  . TONSILLECTOMY    . TOTAL ABDOMINAL HYSTERECTOMY W/ BILATERAL SALPINGOOPHORECTOMY  1980  . UPPER GASTROINTESTINAL ENDOSCOPY     Social History   Social History Narrative   Patient is widowed she has 2 children  she used to work in [employer]   family history includes Atrial fibrillation in her sister; Colon cancer in her sister; Dementia in her brother; Diabetes in her brother, mother, sister, sister, sister, and son; Healthy in her son; Heart disease in her father; Liver cancer in her sister; Ovarian cancer in her sister; Stroke in her father and mother; Uterine cancer in her sister.   Review of Systems Hard of hearing   Objective:   Physical Exam  BP 130/60 (BP Location: Left Arm, Patient Position: Sitting, Cuff Size: Normal)   Pulse 64  Temp 98.1 F (36.7 C)   Ht 5' 6.25" (1.683 m) Comment: height measured without shoes  Wt 161 lb 6 oz (73.2 kg)   BMI 25.85 kg/m  No acute distress elderly white woman decreased hearing Lungs clear Distant heart sounds

## 2019-08-31 NOTE — Telephone Encounter (Signed)
Pt takes Xarelto for afib with CHADS2VASc score of 6 (age x2, sex, CHF, HTN, DM). SCr 1.33, CrCl 35.74, pt on appropriately reduced dose of Xarelto. Ok to hold 2 days prior to procedure.

## 2019-08-31 NOTE — Telephone Encounter (Signed)
Left message to call back and ask to speak with pre-op team. 

## 2019-09-01 ENCOUNTER — Telehealth: Payer: Self-pay | Admitting: Nurse Practitioner

## 2019-09-01 ENCOUNTER — Telehealth: Payer: Self-pay

## 2019-09-01 NOTE — Telephone Encounter (Signed)
Patient informed to hold xarelto 2 days and she verbalized understanding.

## 2019-09-01 NOTE — Telephone Encounter (Signed)
Explained pain med contract signed, not able to refill without an office visit. Appt made for 09/02/19

## 2019-09-01 NOTE — Telephone Encounter (Signed)
I help the pt send a transmission. Transmission received. I told her the nurse will review it and give her a call back.

## 2019-09-01 NOTE — Telephone Encounter (Signed)
   Primary Cardiologist: Lauree Chandler, MD  Chart reviewed as part of pre-operative protocol coverage. Request received for pharmacy clearance only. Miss Katie [name]'s chart was reviewed by our pharmacist.  Per their recommendation she takes Xarelto for atrial fibrillation and CHADS2VASC score of 6. She has a Scr 1.33 and CrCl 35.74 - she on on appropriately reduced dose Xarelto. Per pharmacist review she may hold her Xarelto 2 days prior to the EGD scheduled on 09/13/2019.   I have left a message for [name] to call us back to review these recommendations.   I will route this recommendation to the requesting party via Epic fax function so that they are aware.  I will leave this message in the pre-op pool until we can reach [name] with our recommendations.   Please call with questions.  Loel Dubonnet, NP 09/01/2019, 9:42 AM

## 2019-09-01 NOTE — Telephone Encounter (Signed)
Manual LINQ transmission received. 0.1% AF burden since implant. Some ECGs appear SR w/PACs, known AF, on Xarelto.  Spoke with patient. Pt reports she was hospitalized in [place] (per notes in [place]) for much of December with COVID. Returned home recently. Feeling much better overall. Has f/u with PCP tomorrow. Advised that monitor is now up to date. Pt verbalizes understanding and denies questions at this time.

## 2019-09-01 NOTE — Telephone Encounter (Signed)
What is the name of the medication? Tramadol  Have you contacted your pharmacy to request a refill? No  Which pharmacy would you like this sent to? [facility]   Patient notified that their request is being sent to the clinical staff for review and that they should receive a call once it is complete. If they do not receive a call within 24 hours they can check with their pharmacy or our office.   MMM's pt  Pt has an appt coming up & I told her to keep appt.

## 2019-09-02 ENCOUNTER — Encounter: Payer: Self-pay | Admitting: Nurse Practitioner

## 2019-09-02 ENCOUNTER — Other Ambulatory Visit: Payer: Self-pay

## 2019-09-02 ENCOUNTER — Ambulatory Visit (INDEPENDENT_AMBULATORY_CARE_PROVIDER_SITE_OTHER): Payer: Medicare Other | Admitting: Nurse Practitioner

## 2019-09-02 DIAGNOSIS — G8929 Other chronic pain: Secondary | ICD-10-CM | POA: Diagnosis not present

## 2019-09-02 DIAGNOSIS — M545 Low back pain, unspecified: Secondary | ICD-10-CM

## 2019-09-02 MED ORDER — TRAMADOL HCL 50 MG PO TABS: 50.0000 mg | ORAL_TABLET | Freq: Two times a day (BID) | ORAL | 0 refills | Status: DC

## 2019-09-02 NOTE — Progress Notes (Signed)
Subjective:    Patient ID: Katie [name], female    DOB: 1931/11/27, 86 y.o.   MRN: [id number] Patient come sin today for pain control. She has ad chronic back pain for years. 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 He has since passed away. She was only taking pain meds PRN but now she is needing daily.  [place] Controlled Substance Abuse database reviewed- Yes If yes- were their any concerning findings : no  Depression screen [facility] 2/9 09/02/2019 05/09/2019 04/22/2019  Decreased Interest 0 0 0  Down, Depressed, Hopeless 0 0 0  PHQ - 2 Score 0 0 0  Altered sleeping - - -  Tired, decreased energy - - -  Change in appetite - - -  Feeling bad or failure about yourself  - - -  Trouble concentrating - - -  Moving slowly or fidgety/restless - - -  Suicidal thoughts - - -  PHQ-9 Score - - -  Difficult doing work/chores - - -  Some recent data might be hidden   GAD 7 : Generalized Anxiety Score 09/02/2019  Nervous, Anxious, on Edge 2  Control/stop worrying 2  Worry too much - different things 2  Trouble relaxing 1  Restless 0  Easily annoyed or irritable 1  Afraid - awful might happen 0  Total GAD 7 Score 8  Anxiety Difficulty Somewhat difficult       [place] drug screen performed- Yes - 10/22/18 [facility]APP  0= never  1= seldom  2=sometimes  3= often  4= very often  How often do you have mood swings? 0 How often do you smoke a cigarette within an hour after waling up? 0 How often have you taken medication other than the way that it was prescribed?0 How often have you used illegal drugs in the past 5 years? 0 How often, in your lifetime, have you had legal problems or been arrested? 0  Score 0  Alcohol Audit - How often during the last year have found that you: 0-Never   1- Less than monthly   2- Monthly     3-Weekly     4-daily or almost daily  - found that you were not able to stop  drinking once you started- 0 -failed to do what was normally expected of you because of drinking- 0 -needed a first drink in the morning- 0 -had a feeling of guilt or remorse after drinking- 0 -are/were unable to remember what happened the night before because of your drinking- 0  0- NO   2- yes but not in last year  4- yes during last year -Have you or someone else been injured because of your drinking- 0 - Has anyone been concerned about your drinking or suggested you cut down- 0        TOTAL- 0  ( 0-7- alcohol education, 8-15- simple advice, 16-19 simple advice plus counseling, 20-40 referral for evaluation and treatment )    Designated Pharmacy- [facility]  Pain assessment: Cause of pain- back injury- from caring for ailing husband Pain location- low back radiating into right hip Pain on scale of 1-10- 5/10 currently Frequency- daily What increases pain-walking and standing What makes pain Better-rest and sitting Effects on ADL - makes difficult bto get things done  Prior treatments tried and failed- NSAIDS Current opioids rx- ultram 1-2 a day-usually can get by with 1  but some days needs extra one # prescribed- 60 Morphine mg equivalent- 15MEDD  Pain management agreement reviewed and signed- 09/02/19    Review of Systems  Constitutional: Negative for diaphoresis.  Eyes: Negative for pain.  Respiratory: Negative for shortness of breath.   Cardiovascular: Negative for chest pain, palpitations and leg swelling.  Gastrointestinal: Negative for abdominal pain.  Endocrine: Negative for polydipsia.  Skin: Negative for rash.  Neurological: Negative for dizziness, weakness and headaches.  Hematological: Does not bruise/bleed easily.  All other systems reviewed and are negative.      Objective:   Physical Exam Vitals and nursing note reviewed.  Constitutional:      General: She is not in acute distress.    Appearance: Normal appearance. She is well-developed.  HENT:      Head: Normocephalic.     Nose: Nose normal.  Eyes:     Pupils: Pupils are equal, round, and reactive to light.  Neck:     Vascular: No carotid bruit or JVD.  Cardiovascular:     Rate and Rhythm: Normal rate and regular rhythm.     Heart sounds: Normal heart sounds.  Pulmonary:     Effort: Pulmonary effort is normal. No respiratory distress.     Breath sounds: Normal breath sounds. No wheezing or rales.  Chest:     Chest wall: No tenderness.  Abdominal:     General: Bowel sounds are normal. There is no distension or abdominal bruit.     Palpations: Abdomen is soft. There is no hepatomegaly, splenomegaly, mass or pulsatile mass.     Tenderness: There is no abdominal tenderness.  Musculoskeletal:        General: Normal range of motion.     Cervical back: Normal range of motion and neck supple.     Comments: FROM of lumbar spine with pain on flexion and extension (-) SLR bil Motor strength and sensation distally intact.  Lymphadenopathy:     Cervical: No cervical adenopathy.  Skin:    General: Skin is warm and dry.  Neurological:     Mental Status: She is alert and oriented to person, place, and time.     Deep Tendon Reflexes: Reflexes are normal and symmetric.  Psychiatric:        Behavior: Behavior normal.        Thought Content: Thought content normal.        Judgment: Judgment normal.     BP (!) 158/75   Pulse 77   Temp (!) 97.5 F (36.4 C) (Temporal)   Resp 20   Ht 5\' 6"  (1.676 m)   Wt 158 lb (71.7 kg)   SpO2 97%   BMI 25.50 kg/m        Assessment & Plan:  Katie [name] in today with chief complaint of Pain Management   1. Chronic midline low back pain without sciatica Rest Moist heat if helps Keep future follow up appointments - traMADol (ULTRAM) 50 MG tablet; Take 1 tablet (50 mg total) by mouth 2 (two) times daily.  Dispense: 60 tablet; Refill: 0 - traMADol (ULTRAM) 50 MG tablet; Take 1 tablet (50 mg total) by mouth 2 (two) times daily.  Dispense: 60  tablet; Refill: 0 - traMADol (ULTRAM) 50 MG tablet; Take 1 tablet (50 mg total) by mouth 2 (two) times daily.  Dispense: 60 tablet; Refill: 0  Mary-Margaret Hassell Done, FNP

## 2019-09-13 ENCOUNTER — Other Ambulatory Visit: Payer: Self-pay

## 2019-09-13 ENCOUNTER — Ambulatory Visit (AMBULATORY_SURGERY_CENTER): Payer: Medicare Other | Admitting: Internal Medicine

## 2019-09-13 ENCOUNTER — Encounter: Payer: Self-pay | Admitting: Internal Medicine

## 2019-09-13 VITALS — BP 147/79 | HR 63 | Temp 97.1°F | Resp 13 | Ht 66.25 in | Wt 161.0 lb

## 2019-09-13 DIAGNOSIS — R1310 Dysphagia, unspecified: Secondary | ICD-10-CM | POA: Diagnosis not present

## 2019-09-13 DIAGNOSIS — K219 Gastro-esophageal reflux disease without esophagitis: Secondary | ICD-10-CM | POA: Diagnosis not present

## 2019-09-13 DIAGNOSIS — R1319 Other dysphagia: Secondary | ICD-10-CM

## 2019-09-13 DIAGNOSIS — R634 Abnormal weight loss: Secondary | ICD-10-CM

## 2019-09-13 DIAGNOSIS — K224 Dyskinesia of esophagus: Secondary | ICD-10-CM

## 2019-09-13 MED ORDER — SODIUM CHLORIDE 0.9 % IV SOLN: 500.0000 mL | Freq: Once | INTRAVENOUS | Status: DC

## 2019-09-13 NOTE — Patient Instructions (Addendum)
The esophagus does not function properly - this is called esophageal dysmotility.  There is no easy fix to this. Modifying diet and trying medications and even peppermint candies can help (peppermint relaxes the muscles to stop spasms which do not allow the food to pass).  The medication we gave you leaked into and under the skin so there will be some swelling in the left arm where that happened. This should go away with time - within a few days if not sooner.  My recommendations are:  1) use a dysphagia 2 or 3 diet - I have given you copies of these. Start with dysphagia 3 - if that does not work use dysphagia 3 2) Get a high protein supplement drink and try to get 2 in each day  3) Buy Altoid peppermints and chew and swallow 2 of these before you eat to see if that helps you swallow better 4) Call and make a follow-up appointment to see me in the office - ask for next available 5) warm compresses to the arm to reduce swelling if it needs it 6) restart Xarelto tomorrow 7) I spoke to [place] about this also  I appreciate the opportunity to care for you. Gatha Mayer, MD, FACG  YOU HAD AN ENDOSCOPIC PROCEDURE TODAY AT [facility]:   Refer to the procedure report that was given to you for any specific questions about what was found during the examination.  If the procedure report does not answer your questions, please call your gastroenterologist to clarify.  If you requested that your care partner not be given the details of your procedure findings, then the procedure report has been included in a sealed envelope for you to review at your convenience later.  YOU SHOULD EXPECT: Some feelings of bloating in the abdomen. Passage of more gas than usual.  Walking can help get rid of the air that was put into your GI tract during the procedure and reduce the bloating. If you had a lower endoscopy (such as a colonoscopy or flexible sigmoidoscopy) you may notice spotting of blood in  your stool or on the toilet paper. If you underwent a bowel prep for your procedure, you may not have a normal bowel movement for a few days.  Please Note:  You might notice some irritation and congestion in your nose or some drainage.  This is from the oxygen used during your procedure.  There is no need for concern and it should clear up in a day or so.  SYMPTOMS TO REPORT IMMEDIATELY:   Following upper endoscopy (EGD)  Vomiting of blood or coffee ground material  New chest pain or pain under the shoulder blades  Painful or persistently difficult swallowing  New shortness of breath  Fever of 100F or higher  Black, tarry-looking stools  For urgent or emergent issues, a gastroenterologist can be reached at any hour by calling [phone].   DIET:  We do recommend a small meal at first, but then you may proceed to your regular diet.  Drink plenty of fluids but you should avoid alcoholic beverages for 24 hours.  ACTIVITY:  You should plan to take it easy for the rest of today and you should NOT DRIVE or use heavy machinery until tomorrow (because of the sedation medicines used during the test).    FOLLOW UP: Our staff will call the number listed on your records 48-72 hours following your procedure to check on you and address any questions  or concerns that you may have regarding the information given to you following your procedure. If we do not reach you, we will leave a message.  We will attempt to reach you two times.  During this call, we will ask if you have developed any symptoms of COVID 19. If you develop any symptoms (ie: fever, flu-like symptoms, shortness of breath, cough etc.) before then, please call [phone].  If you test positive for Covid 19 in the 2 weeks post procedure, please call and report this information to [place].    If any biopsies were taken you will be contacted by phone or by letter within the next 1-3 weeks.  Please call us at [phone] if you have not  heard about the biopsies in 3 weeks.    SIGNATURES/CONFIDENTIALITY: You and/or your care partner have signed paperwork which will be entered into your electronic medical record.  These signatures attest to the fact that that the information above on your After Visit Summary has been reviewed and is understood.  Full responsibility of the confidentiality of this discharge information lies with you and/or your care-partner.

## 2019-09-13 NOTE — Progress Notes (Signed)
Pt tolerated well. Left AC swelling decreased will put warm compress on in PACU. VSS. Awake and to recovery. Dr. Carlean Purl aware.

## 2019-09-13 NOTE — Progress Notes (Signed)
Pt had infiltate of propofol per CRNA, site is painful 4/10, Dr Carlean Purl aware. Warm pack applied, site is slightly pink, 2cm swelling, soft, no bleeding.  Pt to apply warm pack as needed for comfort.

## 2019-09-13 NOTE — Progress Notes (Signed)
Temp JB V/S CW 

## 2019-09-13 NOTE — Op Note (Addendum)
[place] Patient Name: Katie [name] Procedure Date: 09/13/2019 11:02 AM MRN: [id number] Endoscopist: Gatha Mayer , MD Age: 86 Referring MD:  Date of Birth: [birth year] Gender: Female Account #: [id number] Procedure:                Upper GI endoscopy Indications:              Dysphagia, Weight loss Medicines:                Propofol per Anesthesia, Monitored Anesthesia Care Procedure:                Pre-Anesthesia Assessment:                           - Prior to the procedure, a History and Physical                            was performed, and patient medications and                            allergies were reviewed. The patient's tolerance of                            previous anesthesia was also reviewed. The risks                            and benefits of the procedure and the sedation                            options and risks were discussed with the patient.                            All questions were answered, and informed consent                            was obtained. Prior Anticoagulants: The patient                            last took Xarelto (rivaroxaban) 2 days prior to the                            procedure. ASA Grade Assessment: III - A patient                            with severe systemic disease. After reviewing the                            risks and benefits, the patient was deemed in                            satisfactory condition to undergo the procedure.                           After obtaining informed consent, the endoscope was  passed under direct vision. Throughout the                            procedure, the patient's blood pressure, pulse, and                            oxygen saturations were monitored continuously. The                            Endoscope was introduced through the mouth, and                            advanced to the second part of duodenum. The upper                            GI  endoscopy was accomplished without difficulty.                            The patient tolerated the procedure well. Scope In: Scope Out: Findings:                 The hypopharynx was normal.                           The examined esophagus was moderately tortuous.                           Abnormal motility was noted in the esophagus. There                            is spasticity and a decrease in motility of the                            esophageal body. The distal esophagus/lower                            esophageal sphincter is patulous. Tertiary                            peristaltic waves are noted.                           The gastroesophageal flap valve was visualized                            endoscopically and classified as Hill Grade III                            (minimal fold, loose to endoscope, hiatal hernia                            likely).                           The exam was otherwise without abnormality.  The cardia and gastric fundus were normal on                            retroflexion. Complications:            No immediate complications. Estimated Blood Loss:     Estimated blood loss: none. Impression:               - Normal hypopharynx.                           - Tortuous esophagus.                           - Abnormal esophageal motility, consistent with                            presbyesophagus.                           - Gastroesophageal flap valve classified as Hill                            Grade III (minimal fold, loose to endoscope, hiatal                            hernia likely).                           - The examination was otherwise normal.                           - No specimens collected.                           - infiltration of propofol subcutaneous left                            antecubital area treated with warm compresses -                            improved significantly while here but was causing                             some pain Recommendation:           - Patient has a contact number available for                            emergencies. The signs and symptoms of potential                            delayed complications were discussed with the                            patient. Return to normal activities tomorrow.  Written discharge instructions were provided to the                            patient.                           - Dysphagia 2 vs 3 indefinitely.                           - Continue present medications.                           - Resume Xarelto (rivaroxaban) at prior dose                            tomorrow.                           - Trial of 2 altoid mints chewed and swallowed qAC                           She is to call and schedule a follow-up appointment                            next available                           Try to get 2 protein drinks in each day                           Discussed w/ son Villa Herb, MD 09/13/2019 11:56:41 AM This report has been signed electronically.

## 2019-09-14 ENCOUNTER — Other Ambulatory Visit: Payer: Self-pay

## 2019-09-14 ENCOUNTER — Ambulatory Visit: Payer: Medicare Other | Admitting: Surgery

## 2019-09-14 ENCOUNTER — Encounter: Payer: Self-pay | Admitting: Surgery

## 2019-09-14 VITALS — Ht 66.0 in | Wt 161.0 lb

## 2019-09-14 DIAGNOSIS — M48062 Spinal stenosis, lumbar region with neurogenic claudication: Secondary | ICD-10-CM

## 2019-09-14 NOTE — Progress Notes (Signed)
86 year old white female comes in today with complaints of chronic low back pain and right lower extremity radiculopathy.  Patient was seen last by Dr. Louanne Skye August 2020.  He sent patient for a lumbar MRI scan that was done May 17, 2019.  That report showed   EXAM: MRI LUMBAR SPINE WITHOUT CONTRAST  TECHNIQUE: Multiplanar, multisequence MR imaging of the lumbar spine was performed. No intravenous contrast was administered.  COMPARISON:  CT lumbar spine 04/13/2015.  FINDINGS: Segmentation:  Standard.  Alignment:  Convex right scoliosis noted.  Vertebrae: No acute abnormality or worrisome marrow lesion. Multiple Schmorl's nodes are seen. Mild, remote superior endplate compression fracture of T12 is unchanged.  Conus medullaris and cauda equina: Conus extends to the L1-2 level. Conus and cauda equina appear normal.  Paraspinal and other soft tissues: Right renal cyst is identified.  Disc levels:  T11-12 is imaged in the sagittal plane only. There is a minimal bulge without stenosis.  T12-L1: Mild-to-moderate facet degenerative change. Otherwise negative.  L1-2: Shallow disc bulge to the right and mild ligamentum flavum thickening. Mild facet arthropathy. No stenosis.  L2-3: Loss of disc space height with a shallow bulge and superimposed left subarticular recess and foraminal protrusion. There is mild to moderate central canal stenosis. Moderate narrowing in the left subarticular recess and foramen is also identified. The right foramen is open.  L3-4: Ligamentum flavum thickening and a broad-based disc bulge cause moderately severe central canal stenosis and right worse than left subarticular recess narrowing. Mild to moderate foraminal narrowing is worse on the right.  L4-5: Bulky ligamentum flavum thickening, broad-based disc bulge and facet degenerative disease worse on the right where there is marrow edema in the right pedicles consistent with  secondary stress change. There is moderately severe to severe central canal stenosis and marked narrowing in the right subarticular recess and foramen. Mild to moderate left foraminal narrowing is present.  L5-S1: Shallow disc bulge with facet degenerative disease. No stenosis.  IMPRESSION: No acute abnormality.  Remote T12 compression fracture is unchanged.  Spondylosis worst at L4-5 where there is moderately severe to severe central canal stenosis and marked narrowing in the right subarticular recess and foramen with encroachment on the exiting right L4 and descending right L5 roots.  Moderately severe central canal stenosis and right worse than left subarticular recess narrowing at L3-4.  Moderate narrowing in the left subarticular recess and foramen at L2-3 where there is mild to moderate central canal stenosis overall.  Patient did not keep appointment as scheduled for MRI follow-up.  States that she continues to have ongoing low back pain that radiates down to the right lower leg.  She currently is followed by pain management and given tramadol.     Exam Pleasant elderly white female alert and oriented in no acute distress.  Negative logroll hips.  Patient has trace right anterior tib and gastroc weakness.  Some pain with right straight leg raise.  Plan I did review MRI report with patient today.  I stressed her the importance of following up with Dr. Louanne Skye as he previously wanted so that he could review MRI in detail with her and discussed all treatment options.  We will schedule appointment within the next couple weeks for him to review the study.  No new medications given.  All questions answered.

## 2019-09-15 ENCOUNTER — Telehealth: Payer: Self-pay | Admitting: *Deleted

## 2019-09-15 NOTE — Telephone Encounter (Signed)
  Follow up Call-  Call back number 09/13/2019  Post procedure Call Back phone  # [phone]  Permission to leave phone message Yes  Some recent data might be hidden     Patient questions:  Do you have a fever, pain , or abdominal swelling? No. Pain Score  0 *  Have you tolerated food without any problems? Yes.    Have you been able to return to your normal activities? Yes.    Do you have any questions about your discharge instructions: Diet   No. Medications  No. Follow up visit  No.  Do you have questions or concerns about your Care? No.  Actions: * If pain score is 4 or above: No action needed, pain <4.  1. Have you developed a fever since your procedure? no  2.   Have you had an respiratory symptoms (SOB or cough) since your procedure? no  3.   Have you tested positive for COVID 19 since your procedure no  4.   Have you had any family members/close contacts diagnosed with the COVID 19 since your procedure?  no   If yes to any of these questions please route to Joylene John, RN and Alphonsa Gin, Therapist, sports.

## 2019-09-19 ENCOUNTER — Ambulatory Visit (INDEPENDENT_AMBULATORY_CARE_PROVIDER_SITE_OTHER): Payer: Medicare Other | Admitting: *Deleted

## 2019-09-19 DIAGNOSIS — I4819 Other persistent atrial fibrillation: Secondary | ICD-10-CM

## 2019-09-19 LAB — CUP PACEART REMOTE DEVICE CHECK
Date Time Interrogation Session: 20210201005721
Implantable Pulse Generator Implant Date: 20200819

## 2019-09-19 NOTE — Progress Notes (Signed)
ILR Remote 

## 2019-09-21 ENCOUNTER — Other Ambulatory Visit: Payer: Self-pay | Admitting: Nurse Practitioner

## 2019-09-21 DIAGNOSIS — I10 Essential (primary) hypertension: Secondary | ICD-10-CM

## 2019-09-21 DIAGNOSIS — D229 Melanocytic nevi, unspecified: Secondary | ICD-10-CM | POA: Diagnosis not present

## 2019-09-21 DIAGNOSIS — L570 Actinic keratosis: Secondary | ICD-10-CM | POA: Diagnosis not present

## 2019-09-21 DIAGNOSIS — L905 Scar conditions and fibrosis of skin: Secondary | ICD-10-CM | POA: Diagnosis not present

## 2019-09-21 DIAGNOSIS — L821 Other seborrheic keratosis: Secondary | ICD-10-CM | POA: Diagnosis not present

## 2019-09-21 DIAGNOSIS — D1801 Hemangioma of skin and subcutaneous tissue: Secondary | ICD-10-CM | POA: Diagnosis not present

## 2019-09-22 ENCOUNTER — Inpatient Hospital Stay (HOSPITAL_COMMUNITY)
Admission: EM | Admit: 2019-09-22 | Discharge: 2019-09-28 | DRG: 481 | Disposition: A | Discharge status: Skilled Nursing Facility | Payer: Medicare Other | Attending: Internal Medicine | Admitting: Internal Medicine

## 2019-09-22 ENCOUNTER — Encounter (HOSPITAL_COMMUNITY): Payer: Self-pay | Admitting: Internal Medicine

## 2019-09-22 ENCOUNTER — Emergency Department (HOSPITAL_COMMUNITY): Payer: Medicare Other

## 2019-09-22 DIAGNOSIS — Z880 Allergy status to penicillin: Secondary | ICD-10-CM

## 2019-09-22 DIAGNOSIS — M810 Age-related osteoporosis without current pathological fracture: Secondary | ICD-10-CM | POA: Diagnosis present

## 2019-09-22 DIAGNOSIS — Z8049 Family history of malignant neoplasm of other genital organs: Secondary | ICD-10-CM

## 2019-09-22 DIAGNOSIS — Z8616 Personal history of COVID-19: Secondary | ICD-10-CM | POA: Diagnosis not present

## 2019-09-22 DIAGNOSIS — R609 Edema, unspecified: Secondary | ICD-10-CM

## 2019-09-22 DIAGNOSIS — K5900 Constipation, unspecified: Secondary | ICD-10-CM | POA: Diagnosis present

## 2019-09-22 DIAGNOSIS — I4819 Other persistent atrial fibrillation: Secondary | ICD-10-CM

## 2019-09-22 DIAGNOSIS — E782 Mixed hyperlipidemia: Secondary | ICD-10-CM

## 2019-09-22 DIAGNOSIS — I951 Orthostatic hypotension: Secondary | ICD-10-CM | POA: Diagnosis present

## 2019-09-22 DIAGNOSIS — E039 Hypothyroidism, unspecified: Secondary | ICD-10-CM | POA: Diagnosis not present

## 2019-09-22 DIAGNOSIS — E860 Dehydration: Secondary | ICD-10-CM | POA: Diagnosis present

## 2019-09-22 DIAGNOSIS — W010XXA Fall on same level from slipping, tripping and stumbling without subsequent striking against object, initial encounter: Secondary | ICD-10-CM | POA: Diagnosis present

## 2019-09-22 DIAGNOSIS — J45909 Unspecified asthma, uncomplicated: Secondary | ICD-10-CM | POA: Diagnosis present

## 2019-09-22 DIAGNOSIS — E1122 Type 2 diabetes mellitus with diabetic chronic kidney disease: Secondary | ICD-10-CM | POA: Diagnosis not present

## 2019-09-22 DIAGNOSIS — S72092D Other fracture of head and neck of left femur, subsequent encounter for closed fracture with routine healing: Secondary | ICD-10-CM | POA: Diagnosis not present

## 2019-09-22 DIAGNOSIS — K219 Gastro-esophageal reflux disease without esophagitis: Secondary | ICD-10-CM | POA: Diagnosis not present

## 2019-09-22 DIAGNOSIS — K224 Dyskinesia of esophagus: Secondary | ICD-10-CM | POA: Diagnosis present

## 2019-09-22 DIAGNOSIS — N1832 Chronic kidney disease, stage 3b: Secondary | ICD-10-CM

## 2019-09-22 DIAGNOSIS — F419 Anxiety disorder, unspecified: Secondary | ICD-10-CM | POA: Diagnosis present

## 2019-09-22 DIAGNOSIS — E785 Hyperlipidemia, unspecified: Secondary | ICD-10-CM | POA: Diagnosis present

## 2019-09-22 DIAGNOSIS — I5032 Chronic diastolic (congestive) heart failure: Secondary | ICD-10-CM | POA: Diagnosis not present

## 2019-09-22 DIAGNOSIS — Z85828 Personal history of other malignant neoplasm of skin: Secondary | ICD-10-CM | POA: Diagnosis not present

## 2019-09-22 DIAGNOSIS — Z8249 Family history of ischemic heart disease and other diseases of the circulatory system: Secondary | ICD-10-CM

## 2019-09-22 DIAGNOSIS — Z03818 Encounter for observation for suspected exposure to other biological agents ruled out: Secondary | ICD-10-CM | POA: Diagnosis not present

## 2019-09-22 DIAGNOSIS — Z9181 History of falling: Secondary | ICD-10-CM | POA: Diagnosis not present

## 2019-09-22 DIAGNOSIS — S72142D Displaced intertrochanteric fracture of left femur, subsequent encounter for closed fracture with routine healing: Secondary | ICD-10-CM | POA: Diagnosis not present

## 2019-09-22 DIAGNOSIS — S72002A Fracture of unspecified part of neck of left femur, initial encounter for closed fracture: Secondary | ICD-10-CM | POA: Diagnosis not present

## 2019-09-22 DIAGNOSIS — S79929A Unspecified injury of unspecified thigh, initial encounter: Secondary | ICD-10-CM | POA: Diagnosis not present

## 2019-09-22 DIAGNOSIS — I480 Paroxysmal atrial fibrillation: Secondary | ICD-10-CM | POA: Diagnosis not present

## 2019-09-22 DIAGNOSIS — H9190 Unspecified hearing loss, unspecified ear: Secondary | ICD-10-CM | POA: Diagnosis present

## 2019-09-22 DIAGNOSIS — M21252 Flexion deformity, left hip: Secondary | ICD-10-CM | POA: Diagnosis not present

## 2019-09-22 DIAGNOSIS — I10 Essential (primary) hypertension: Secondary | ICD-10-CM

## 2019-09-22 DIAGNOSIS — W1830XA Fall on same level, unspecified, initial encounter: Secondary | ICD-10-CM | POA: Diagnosis not present

## 2019-09-22 DIAGNOSIS — E876 Hypokalemia: Secondary | ICD-10-CM | POA: Diagnosis not present

## 2019-09-22 DIAGNOSIS — E1165 Type 2 diabetes mellitus with hyperglycemia: Secondary | ICD-10-CM | POA: Diagnosis present

## 2019-09-22 DIAGNOSIS — Z888 Allergy status to other drugs, medicaments and biological substances status: Secondary | ICD-10-CM

## 2019-09-22 DIAGNOSIS — Z833 Family history of diabetes mellitus: Secondary | ICD-10-CM

## 2019-09-22 DIAGNOSIS — D539 Nutritional anemia, unspecified: Secondary | ICD-10-CM | POA: Diagnosis not present

## 2019-09-22 DIAGNOSIS — E119 Type 2 diabetes mellitus without complications: Secondary | ICD-10-CM

## 2019-09-22 DIAGNOSIS — F5101 Primary insomnia: Secondary | ICD-10-CM

## 2019-09-22 DIAGNOSIS — S199XXA Unspecified injury of neck, initial encounter: Secondary | ICD-10-CM | POA: Diagnosis not present

## 2019-09-22 DIAGNOSIS — S72142A Displaced intertrochanteric fracture of left femur, initial encounter for closed fracture: Secondary | ICD-10-CM | POA: Diagnosis present

## 2019-09-22 DIAGNOSIS — M2550 Pain in unspecified joint: Secondary | ICD-10-CM | POA: Diagnosis not present

## 2019-09-22 DIAGNOSIS — Z800 Family history of malignant neoplasm of digestive organs: Secondary | ICD-10-CM

## 2019-09-22 DIAGNOSIS — K449 Diaphragmatic hernia without obstruction or gangrene: Secondary | ICD-10-CM | POA: Diagnosis present

## 2019-09-22 DIAGNOSIS — E1169 Type 2 diabetes mellitus with other specified complication: Secondary | ICD-10-CM | POA: Diagnosis present

## 2019-09-22 DIAGNOSIS — D6959 Other secondary thrombocytopenia: Secondary | ICD-10-CM | POA: Diagnosis present

## 2019-09-22 DIAGNOSIS — D62 Acute posthemorrhagic anemia: Secondary | ICD-10-CM | POA: Diagnosis not present

## 2019-09-22 DIAGNOSIS — Z8041 Family history of malignant neoplasm of ovary: Secondary | ICD-10-CM

## 2019-09-22 DIAGNOSIS — N183 Chronic kidney disease, stage 3 unspecified: Secondary | ICD-10-CM | POA: Diagnosis not present

## 2019-09-22 DIAGNOSIS — I130 Hypertensive heart and chronic kidney disease with heart failure and stage 1 through stage 4 chronic kidney disease, or unspecified chronic kidney disease: Secondary | ICD-10-CM | POA: Diagnosis present

## 2019-09-22 DIAGNOSIS — S72009A Fracture of unspecified part of neck of unspecified femur, initial encounter for closed fracture: Secondary | ICD-10-CM

## 2019-09-22 DIAGNOSIS — E559 Vitamin D deficiency, unspecified: Secondary | ICD-10-CM | POA: Diagnosis not present

## 2019-09-22 DIAGNOSIS — M6281 Muscle weakness (generalized): Secondary | ICD-10-CM | POA: Diagnosis not present

## 2019-09-22 DIAGNOSIS — S72002D Fracture of unspecified part of neck of left femur, subsequent encounter for closed fracture with routine healing: Secondary | ICD-10-CM | POA: Diagnosis not present

## 2019-09-22 DIAGNOSIS — S0990XA Unspecified injury of head, initial encounter: Secondary | ICD-10-CM | POA: Diagnosis not present

## 2019-09-22 DIAGNOSIS — Z7901 Long term (current) use of anticoagulants: Secondary | ICD-10-CM

## 2019-09-22 DIAGNOSIS — Z7401 Bed confinement status: Secondary | ICD-10-CM | POA: Diagnosis not present

## 2019-09-22 DIAGNOSIS — M25552 Pain in left hip: Secondary | ICD-10-CM | POA: Diagnosis not present

## 2019-09-22 DIAGNOSIS — Z419 Encounter for procedure for purposes other than remedying health state, unspecified: Secondary | ICD-10-CM

## 2019-09-22 DIAGNOSIS — T148XXA Other injury of unspecified body region, initial encounter: Secondary | ICD-10-CM

## 2019-09-22 DIAGNOSIS — I4892 Unspecified atrial flutter: Secondary | ICD-10-CM | POA: Diagnosis present

## 2019-09-22 DIAGNOSIS — S72002S Fracture of unspecified part of neck of left femur, sequela: Secondary | ICD-10-CM | POA: Diagnosis not present

## 2019-09-22 DIAGNOSIS — Z823 Family history of stroke: Secondary | ICD-10-CM

## 2019-09-22 DIAGNOSIS — R531 Weakness: Secondary | ICD-10-CM | POA: Diagnosis not present

## 2019-09-22 LAB — CBC WITH DIFFERENTIAL/PLATELET
Abs Immature Granulocytes: 0.02 10*3/uL (ref 0.00–0.07)
Basophils Absolute: 0 10*3/uL (ref 0.0–0.1)
Basophils Relative: 0 %
Eosinophils Absolute: 0.1 10*3/uL (ref 0.0–0.5)
Eosinophils Relative: 1 %
HCT: 34.7 % — ABNORMAL LOW (ref 36.0–46.0)
Hemoglobin: 11.4 g/dL — ABNORMAL LOW (ref 12.0–15.0)
Immature Granulocytes: 0 %
Lymphocytes Relative: 21 %
Lymphs Abs: 1.7 10*3/uL (ref 0.7–4.0)
MCH: 33.3 pg (ref 26.0–34.0)
MCHC: 32.9 g/dL (ref 30.0–36.0)
MCV: 101.5 fL — ABNORMAL HIGH (ref 80.0–100.0)
Monocytes Absolute: 0.5 10*3/uL (ref 0.1–1.0)
Monocytes Relative: 6 %
Neutro Abs: 5.6 10*3/uL (ref 1.7–7.7)
Neutrophils Relative %: 72 %
Platelets: 159 10*3/uL (ref 150–400)
RBC: 3.42 MIL/uL — ABNORMAL LOW (ref 3.87–5.11)
RDW: 13 % (ref 11.5–15.5)
WBC: 7.8 10*3/uL (ref 4.0–10.5)
nRBC: 0 % (ref 0.0–0.2)

## 2019-09-22 LAB — PROTIME-INR
INR: 1.6 — ABNORMAL HIGH (ref 0.8–1.2)
Prothrombin Time: 19.1 seconds — ABNORMAL HIGH (ref 11.4–15.2)

## 2019-09-22 LAB — GLUCOSE, CAPILLARY
Glucose-Capillary: 163 mg/dL — ABNORMAL HIGH (ref 70–99)
Glucose-Capillary: 144 mg/dL — ABNORMAL HIGH (ref 70–99)

## 2019-09-22 LAB — RESPIRATORY PANEL BY RT PCR (FLU A&B, COVID)
Influenza A by PCR: NEGATIVE
Influenza B by PCR: NEGATIVE
SARS Coronavirus 2 by RT PCR: NEGATIVE

## 2019-09-22 LAB — COMPREHENSIVE METABOLIC PANEL
ALT: 13 U/L (ref 0–44)
AST: 22 U/L (ref 15–41)
Albumin: 3.5 g/dL (ref 3.5–5.0)
Alkaline Phosphatase: 62 U/L (ref 38–126)
Anion gap: 12 (ref 5–15)
BUN: 15 mg/dL (ref 8–23)
CO2: 24 mmol/L (ref 22–32)
Calcium: 8.9 mg/dL (ref 8.9–10.3)
Chloride: 103 mmol/L (ref 98–111)
Creatinine, Ser: 1.37 mg/dL — ABNORMAL HIGH (ref 0.44–1.00)
GFR calc Af Amer: 41 mL/min — ABNORMAL LOW (ref >60)
GFR calc non Af Amer: 35 mL/min — ABNORMAL LOW (ref >60)
Glucose, Bld: 176 mg/dL — ABNORMAL HIGH (ref 70–99)
Potassium: 4.2 mmol/L (ref 3.5–5.1)
Sodium: 139 mmol/L (ref 135–145)
Total Bilirubin: 0.7 mg/dL (ref 0.3–1.2)
Total Protein: 6 g/dL — ABNORMAL LOW (ref 6.5–8.1)

## 2019-09-22 LAB — SURGICAL PCR SCREEN
MRSA, PCR: NEGATIVE
Staphylococcus aureus: NEGATIVE

## 2019-09-22 LAB — ABO/RH: ABO/RH(D): O POS

## 2019-09-22 MED ORDER — BISACODYL 5 MG PO TBEC: 5.0000 mg | DELAYED_RELEASE_TABLET | Freq: Every day | ORAL | Status: DC | PRN

## 2019-09-22 MED ORDER — FENTANYL CITRATE (PF) 100 MCG/2ML IJ SOLN
50.0000 ug | INTRAMUSCULAR | Status: AC | PRN
  Administered 2019-09-22 (×2): 50 ug via INTRAVENOUS
  Filled 2019-09-22 (×2): qty 2

## 2019-09-22 MED ORDER — ALBUTEROL SULFATE (2.5 MG/3ML) 0.083% IN NEBU: 2.5000 mg | INHALATION_SOLUTION | Freq: Four times a day (QID) | RESPIRATORY_TRACT | Status: DC | PRN

## 2019-09-22 MED ORDER — ONDANSETRON HCL 4 MG/2ML IJ SOLN
4.0000 mg | Freq: Once | INTRAMUSCULAR | Status: AC
  Administered 2019-09-22: 15:00:00 4 mg via INTRAVENOUS
  Filled 2019-09-22: qty 2

## 2019-09-22 MED ORDER — CLONIDINE HCL 0.1 MG PO TABS
0.1000 mg | ORAL_TABLET | Freq: Two times a day (BID) | ORAL | Status: DC
  Administered 2019-09-22: 0.1 mg via ORAL
  Filled 2019-09-22 (×2): qty 1

## 2019-09-22 MED ORDER — FENTANYL CITRATE (PF) 100 MCG/2ML IJ SOLN
50.0000 ug | Freq: Once | INTRAMUSCULAR | Status: AC
  Administered 2019-09-22: 13:00:00 50 ug via INTRAVENOUS
  Filled 2019-09-22: qty 2

## 2019-09-22 MED ORDER — MORPHINE SULFATE (PF) 2 MG/ML IV SOLN
0.5000 mg | INTRAVENOUS | Status: DC | PRN
  Administered 2019-09-22 – 2019-09-23 (×3): 0.5 mg via INTRAVENOUS
  Filled 2019-09-22 (×3): qty 1

## 2019-09-22 MED ORDER — PANTOPRAZOLE SODIUM 40 MG PO TBEC
40.0000 mg | DELAYED_RELEASE_TABLET | Freq: Every day | ORAL | Status: DC
  Administered 2019-09-23 – 2019-09-28 (×6): 40 mg via ORAL
  Filled 2019-09-22 (×6): qty 1

## 2019-09-22 MED ORDER — TRAMADOL HCL 50 MG PO TABS
50.0000 mg | ORAL_TABLET | Freq: Two times a day (BID) | ORAL | Status: DC
  Administered 2019-09-22 – 2019-09-23 (×2): 50 mg via ORAL
  Filled 2019-09-22 (×3): qty 1

## 2019-09-22 MED ORDER — PRAVASTATIN SODIUM 40 MG PO TABS
40.0000 mg | ORAL_TABLET | Freq: Every day | ORAL | Status: DC
  Administered 2019-09-22 – 2019-09-28 (×7): 40 mg via ORAL
  Filled 2019-09-22 (×7): qty 1

## 2019-09-22 MED ORDER — CLONAZEPAM 0.25 MG PO TBDP
0.2500 mg | ORAL_TABLET | Freq: Every day | ORAL | Status: DC
  Administered 2019-09-22 – 2019-09-27 (×6): 0.25 mg via ORAL
  Filled 2019-09-22 (×6): qty 1

## 2019-09-22 MED ORDER — LORATADINE 10 MG PO TABS: 10.0000 mg | ORAL_TABLET | Freq: Every day | ORAL | Status: DC | PRN

## 2019-09-22 MED ORDER — LACTATED RINGERS IV SOLN: INTRAVENOUS | Status: DC

## 2019-09-22 MED ORDER — POLYETHYLENE GLYCOL 3350 17 G PO PACK: 17.0000 g | PACK | Freq: Every day | ORAL | Status: DC | PRN

## 2019-09-22 MED ORDER — CLINDAMYCIN PHOSPHATE 900 MG/50ML IV SOLN
900.0000 mg | INTRAVENOUS | Status: AC
  Administered 2019-09-23: 11:00:00 900 mg via INTRAVENOUS
  Filled 2019-09-22: qty 50

## 2019-09-22 MED ORDER — INSULIN ASPART 100 UNIT/ML ~~LOC~~ SOLN
0.0000 [IU] | Freq: Three times a day (TID) | SUBCUTANEOUS | Status: DC
  Administered 2019-09-22 – 2019-09-23 (×2): 3 [IU] via SUBCUTANEOUS
  Administered 2019-09-24 – 2019-09-27 (×6): 2 [IU] via SUBCUTANEOUS
  Administered 2019-09-27: 12:00:00 3 [IU] via SUBCUTANEOUS
  Administered 2019-09-28: 09:00:00 2 [IU] via SUBCUTANEOUS
  Administered 2019-09-28: 13:00:00 3 [IU] via SUBCUTANEOUS

## 2019-09-22 MED ORDER — DOCUSATE SODIUM 100 MG PO CAPS
100.0000 mg | ORAL_CAPSULE | Freq: Two times a day (BID) | ORAL | Status: DC
  Administered 2019-09-22 – 2019-09-28 (×11): 100 mg via ORAL
  Filled 2019-09-22 (×11): qty 1

## 2019-09-22 MED ORDER — POVIDONE-IODINE 10 % EX SWAB: 2.0000 "application " | Freq: Once | CUTANEOUS | Status: DC

## 2019-09-22 MED ORDER — HYDROCODONE-ACETAMINOPHEN 5-325 MG PO TABS
1.0000 | ORAL_TABLET | Freq: Four times a day (QID) | ORAL | Status: DC | PRN
  Administered 2019-09-22 (×2): 1 via ORAL
  Administered 2019-09-23: 2 via ORAL
  Filled 2019-09-22: qty 2
  Filled 2019-09-22 (×2): qty 1

## 2019-09-22 MED ORDER — LEVOTHYROXINE SODIUM 50 MCG PO TABS
50.0000 ug | ORAL_TABLET | Freq: Every day | ORAL | Status: DC
  Administered 2019-09-23 – 2019-09-28 (×6): 50 ug via ORAL
  Filled 2019-09-22 (×6): qty 1

## 2019-09-22 MED ORDER — CHLORHEXIDINE GLUCONATE 4 % EX LIQD
60.0000 mL | Freq: Once | CUTANEOUS | Status: AC
  Administered 2019-09-22: 4 via TOPICAL
  Filled 2019-09-22: qty 15

## 2019-09-22 MED ORDER — METHOCARBAMOL 1000 MG/10ML IJ SOLN
500.0000 mg | Freq: Four times a day (QID) | INTRAVENOUS | Status: DC | PRN
  Filled 2019-09-22: qty 5

## 2019-09-22 MED ORDER — METHOCARBAMOL 500 MG PO TABS
500.0000 mg | ORAL_TABLET | Freq: Four times a day (QID) | ORAL | Status: DC | PRN
  Administered 2019-09-22 – 2019-09-28 (×12): 500 mg via ORAL
  Filled 2019-09-22 (×12): qty 1

## 2019-09-22 MED ORDER — FLUTICASONE PROPIONATE 50 MCG/ACT NA SUSP
2.0000 | Freq: Every day | NASAL | Status: DC
  Administered 2019-09-23 – 2019-09-28 (×6): 2 via NASAL
  Filled 2019-09-22: qty 16

## 2019-09-22 MED ORDER — ONDANSETRON HCL 4 MG/2ML IJ SOLN
4.0000 mg | Freq: Once | INTRAMUSCULAR | Status: AC
  Administered 2019-09-22: 11:00:00 4 mg via INTRAVENOUS
  Filled 2019-09-22: qty 2

## 2019-09-22 NOTE — ED Notes (Signed)
Pt transported to CT ?

## 2019-09-22 NOTE — ED Triage Notes (Signed)
Pt to ED via EMS from home, She tripped this morning around 830, resulting in left hip injury . No LOC, pt takes Xerelto for Afib. #18 RAC- 200mg  of Fentanyl given by EMS. Last VS: 145/82, P 82, 211 cbg, 100% RA.

## 2019-09-22 NOTE — H&P (Addendum)
History and Physical    Katie [name] [id number] DOB: 25-Apr-1932 DOA: 09/22/2019  PCP: [name], FNP Consultants:  Carlean Purl - GI; Allred - cardiology Patient coming from:  Home - lives alone; NOK: [place], [place], [phone]  Chief Complaint: Fall  HPI: Katie [name] is a 86 y.o. female with medical history significant of diet-controlled DM; hypothyroidism; HTN; HLD;  Stage 3 CKD; chronic diastolic CHF; and afib on Xarelto presenting with a fall.  On Thursday AM, she gets her hair done.  She went out to start her car to warm it up.  When she got out, she slipped on gravel and slid onto the ground.  She was unable to get up.  She had her phone and called her niece and they called 93.  She was very cold, lying on the ground.  She felt fine this AM prior to the fall.  Her last dose of Xarelto was about 5pm on 2/3.    She reports that she has had a very hard time since her husband died in 11-09-2018.  She had dysphagia and was admitted at [facility] from 12/16-19 with GERD; she was found to have COVID-19 infection and an E coli UTI.  She had an EGD with Dr. Carlean Purl on 1/26 and was found to have dysmotility/presbyesophagus and a Hill Grade III gastroesophageal flap.      ED Course:  Hip fracture from a fall.  On Xarelto.  Surgery tomorrow.  Review of Systems: As per HPI; otherwise review of systems reviewed and negative.   Ambulatory Status:  Ambulates without assistance  Past Medical History:  Diagnosis Date  . Anxiety   . Aortic insufficiency    a. Trivial AI by echo 02/2016.  [name] Atrial fibrillation and flutter ([place])    a. Coarse afib vs flutter by EKG 12/2015.  [name] Cancer ([place])    skin cancer  . Cataract   . Chronic diastolic CHF (congestive heart failure) ([place])   . CKD (chronic kidney disease), stage III   . COVID-19   . GERD (gastroesophageal reflux disease)   . Hiatal hernia   . Hypercholesterolemia   . Hypertension   . Hypokalemia   . Hypothyroidism   . Left  knee pain   . NIDDM (non-insulin dependent diabetes mellitus)    diet controlled   . Osteoporosis   . Premature atrial contractions   . PVC's (premature ventricular contractions)   . Vitamin D deficiency     Past Surgical History:  Procedure Laterality Date  . A-FLUTTER ABLATION N/A 02/08/2019   Procedure: A-FLUTTER ABLATION;  Surgeon: Thompson Grayer, MD;  Location: [facility];  Service: Cardiovascular;  Laterality: N/A;  . ABDOMINAL HYSTERECTOMY    . APPENDECTOMY  1980  . BACK SURGERY    . CATARACT EXTRACTION, BILATERAL    . CHOLECYSTECTOMY  5/00  . COLONOSCOPY    . implantable loop recorder placement  04/06/2019   MDT Reveal LINQ [id number]) implanted for evaluation of palpitations and afib post atrial flutter ablation by Dr Rayann Heman in office  . TONSILLECTOMY    . TOTAL ABDOMINAL HYSTERECTOMY W/ BILATERAL SALPINGOOPHORECTOMY  1980  . UPPER GASTROINTESTINAL ENDOSCOPY      Social History   Socioeconomic History  . Marital status: Widowed    Spouse name: Not on file  . Number of children: 2  . Years of education: Not on file  . Highest education level: High school graduate  Occupational History  . Occupation: Retired  Comment: [employer] , golf, farm   Tobacco Use  . Smoking status: Never Smoker  . Smokeless tobacco: Never Used  Substance and Sexual Activity  . Alcohol use: No  . Drug use: No  . Sexual activity: Not Currently  Other Topics Concern  . Not on file  Social History Narrative   Patient is widowed she has 2 children she used to work in [place] Strain:   . Difficulty of Paying Living Expenses: Not on file  Food Insecurity:   . Worried About Charity fundraiser in the Last Year: Not on file  . Ran Out of Food in the Last Year: Not on file  Transportation Needs:   . Lack of Transportation (Medical): Not on file  . Lack of Transportation (Non-Medical): Not on file   Physical Activity:   . Days of Exercise per Week: Not on file  . Minutes of Exercise per Session: Not on file  Stress:   . Feeling of Stress : Not on file  Social Connections:   . Frequency of Communication with Friends and Family: Not on file  . Frequency of Social Gatherings with Friends and Family: Not on file  . Attends Religious Services: Not on file  . Active Member of Clubs or Organizations: Not on file  . Attends Archivist Meetings: Not on file  . Marital Status: Not on file  Intimate Partner Violence:   . Fear of Current or Ex-Partner: Not on file  . Emotionally Abused: Not on file  . Physically Abused: Not on file  . Sexually Abused: Not on file    Allergies  Allergen Reactions  . Amlodipine Swelling  . Macrodantin Nausea And Vomiting  . Metformin And Related Nausea And Vomiting and Other (See Comments)    Bloating  . Penicillins Rash    Did it involve swelling of the face/tongue/throat, SOB, or low BP? Unknown Did it involve sudden or severe rash/hives, skin peeling, or any reaction on the inside of your mouth or nose? Yes Did you need to seek medical attention at a hospital or doctor's office? Yes When did it last happen? 2005  If all above answers are "NO", may proceed with cephalosporin use.     Family History  Problem Relation Age of Onset  . Diabetes Mother   . Stroke Mother   . Heart disease Father   . Stroke Father   . Uterine cancer Sister   . Diabetes Sister   . Ovarian cancer Sister   . Colon cancer Sister   . Diabetes Sister   . Liver cancer Sister        \  . Diabetes Brother   . Dementia Brother   . Atrial fibrillation Sister   . Diabetes Sister   . Diabetes Son   . Healthy Son   . Heart attack Neg Hx   . Hypertension Neg Hx   . Esophageal cancer Neg Hx   . Rectal cancer Neg Hx   . Stomach cancer Neg Hx     Prior to Admission medications   Medication Sig Start Date End Date Taking? Authorizing Provider  albuterol  (VENTOLIN HFA) 108 (90 Base) MCG/ACT inhaler INHALE TWO puffs by MOUTH every SIX hours as needed FOR wheezing OR shortness of breath. 08/13/19   [provider]  AMBULATORY NON FORMULARY MEDICATION GI cocktail 90 ml viscous lidocaine, 62ml-10mg /63ml dicyclomine, 267ml maalox Sig: Swish and swallow  41ml's by mouth four times daily 08/30/19   Gatha Mayer, MD  benazepril (LOTENSIN) 40 MG tablet Take 1.5 tablets (60 mg total) by mouth daily. 06/27/19   Hassell Done, Mary-Margaret, FNP  clonazePAM (KLONOPIN) 0.5 MG tablet Take 1 tablet (0.5 mg total) by mouth 2 (two) times daily as needed for anxiety. Patient taking differently: Take 0.25 mg by mouth at bedtime.  03/15/19   [name], FNP  cloNIDine (CATAPRES) 0.1 MG tablet Take 1 tablet by mouth twice daily 09/21/19   Hassell Done, Mary-Margaret, FNP  fluticasone (FLONASE) 50 MCG/ACT nasal spray Place 2 sprays into both nostrils daily. 06/21/19   Hassell Done Mary-Margaret, FNP  furosemide (LASIX) 40 MG tablet TAKE 1 TABLET BY MOUTH IN  THE MORNING DAILY AND 1/2  TABLET IN THE AFTERNOON 06/27/19   [name], FNP  levothyroxine (SYNTHROID) 50 MCG tablet TAKE 1 TABLET BY MOUTH  DAILY BEFORE BREAKFAST 06/27/19   Hassell Done, Mary-Margaret, FNP  loratadine (CLARITIN) 10 MG tablet Take 10 mg by mouth daily as needed for allergies.    [provider]  lovastatin (MEVACOR) 40 MG tablet TAKE 2 TABLETS BY MOUTH AT  BEDTIME 06/13/19   Hassell Done, Mary-Margaret, FNP  pantoprazole (PROTONIX) 40 MG tablet Take 1 tablet (40 mg total) by mouth daily. 07/04/19   Hassell Done, Mary-Margaret, FNP  potassium chloride SA (K-DUR) 20 MEQ tablet TAKE 1 TABLET BY MOUTH TWO  TIMES DAILY 05/02/19   Hassell Done, Mary-Margaret, FNP  Propylene Glycol (SYSTANE BALANCE) 0.6 % SOLN Place 1 drop into both eyes daily as needed (dry eyes).    [provider]  raloxifene (EVISTA) 60 MG tablet Take 1 tablet (60 mg total) by mouth daily. 06/27/19   Hassell Done Mary-Margaret, FNP   Rivaroxaban (XARELTO) 15 MG TABS tablet Take 1 tablet (15 mg total) by mouth daily with supper. 06/08/19   Allred, Jeneen Rinks, MD  traMADol (ULTRAM) 50 MG tablet Take 1 tablet (50 mg total) by mouth 2 (two) times daily. 09/02/19 10/02/19  Hassell Done Mary-Margaret, FNP  traMADol (ULTRAM) 50 MG tablet Take 1 tablet (50 mg total) by mouth 2 (two) times daily. Patient not taking: Reported on 09/13/2019 10/02/19 11/01/19  [name], FNP  traMADol (ULTRAM) 50 MG tablet Take 1 tablet (50 mg total) by mouth 2 (two) times daily. Patient not taking: Reported on 09/13/2019 11/01/19 12/01/19  [name], FNP    Physical Exam: Vitals:   09/22/19 1430 09/22/19 1500 09/22/19 1530 09/22/19 1618  BP: 106/71 (!) 139/95 (!) 142/61 (!) 156/66  Pulse: 95 91 75 94  Resp: 17 15 14 16   Temp:    99.4 F (37.4 C)  TempSrc:    Oral  SpO2: 100% 100% 100% 94%  Weight:      Height:         . General:  Appears calm but uncomfortable and is NAD . Eyes:  PERRL, EOMI, normal lids, iris . ENT:  grossly normal hearing, lips & tongue, mmm . Neck:  no LAD, masses or thyromegaly . Cardiovascular:  RRR, no m/r/g. No LE edema.  [name] Respiratory:   CTA bilaterally with no wheezes/rales/rhonchi.  Normal respiratory effort. . Abdomen:  soft, NT, ND, NABS . Skin:  Excoriation of left elbow from fall, hemostatic . Musculoskeletal:  Left leg shortened and externally rotated . Lower extremity:  No LE edema.  Limited foot exam with no ulcerations.  2+ distal pulses. [name] Psychiatric:  grossly normal mood and affect, speech fluent and appropriate, AOx3 . Neurologic:  CN 2-12 grossly intact,  moves all extremities in coordinated fashion    Radiological Exams on Admission: DG Chest 1 View  Result Date: 09/22/2019 CLINICAL DATA:  Hip fracture. EXAM: CHEST  1 VIEW COMPARISON:  None. FINDINGS: The heart size and mediastinal contours are within normal limits. Both lungs are clear. The visualized skeletal structures are  unremarkable. IMPRESSION: No active disease. Electronically Signed   By: Marijo Conception M.D.   On: 09/22/2019 11:14   CT HEAD WO CONTRAST  Result Date: 09/22/2019 CLINICAL DATA:  Tripped and fell, left hip injury, anticoagulated for atrial fibrillation EXAM: CT HEAD WITHOUT CONTRAST TECHNIQUE: Contiguous axial images were obtained from the base of the skull through the vertex without intravenous contrast. COMPARISON:  None. FINDINGS: Brain: No acute infarct or hemorrhage. Lateral ventricles and midline structures are unremarkable. No acute extra-axial fluid collections. No mass effect. Vascular: No hyperdense vessel or unexpected calcification. Skull: Normal. Negative for fracture or focal lesion. Sinuses/Orbits: No acute finding. Other: None IMPRESSION: 1. No acute intracranial process.  No evidence of trauma. Electronically Signed   By: Randa Ngo M.D.   On: 09/22/2019 11:38   CT CERVICAL SPINE WO CONTRAST  Result Date: 09/22/2019 CLINICAL DATA:  [place] this morning, anticoagulated, left hip injury EXAM: CT CERVICAL SPINE WITHOUT CONTRAST TECHNIQUE: Multidetector CT imaging of the cervical spine was performed without intravenous contrast. Multiplanar CT image reconstructions were also generated. COMPARISON:  None. FINDINGS: Alignment: There is mild levoscoliosis of the cervical spine. Otherwise alignment is anatomic. Skull base and vertebrae: There are no acute displaced cervical spine fractures. Soft tissues and spinal canal: There is extensive atherosclerosis within the coronary vasculature. Prevertebral soft tissues are unremarkable. Airways patent. Disc levels: Multilevel cervical spondylosis is identified. Mild circumferential disc osteophyte complex is seen from C3-4 through C6-7. There is a right predominant neural foraminal encroachment at these levels. Bony fusion across the bilateral facet joints at C2/C3. Upper chest: Lung apices are clear. Other: Reconstructed images demonstrate no additional  findings. IMPRESSION: 1. No acute cervical spine fracture. 2. Multilevel cervical spondylosis. 3. Prominent atherosclerosis of the carotid bifurcations. Electronically Signed   By: Randa Ngo M.D.   On: 09/22/2019 11:42   DG Hip Unilat With Pelvis 2-3 Views Left  Result Date: 09/22/2019 CLINICAL DATA:  Left hip pain after fall. EXAM: DG HIP (WITH OR WITHOUT PELVIS) 2-3V LEFT COMPARISON:  None. FINDINGS: Severely displaced and comminuted fracture is seen involving the intertrochanteric region of the proximal left femur. Vascular calcifications are noted. IMPRESSION: Severely displaced and comminuted intertrochanteric fracture of proximal left femur. Electronically Signed   By: Marijo Conception M.D.   On: 09/22/2019 11:13    EKG: Independently reviewed.  NSR with rate 71; no evidence of acute ischemia   Labs on Admission: I have personally reviewed the available labs and imaging studies at the time of the admission.  Pertinent labs:   Glucose 176 BUN 15/Creatinine 1.37/GFR 35 - stable WBC 7.8 Hgb 11.4 INR 1.6 Respiratory panel PCR negative   Assessment/Plan Principal Problem:   Hip fracture (HCC) Active Problems:   Hypertension   Hyperlipidemia   Diabetes mellitus type 2, diet-controlled (HCC)   GERD (gastroesophageal reflux disease)   Hypothyroidism   CKD (chronic kidney disease) stage 3, GFR 30-59 ml/min   Persistent atrial fibrillation (HCC)   Chronic diastolic CHF (congestive heart failure) (HCC)   Hip fracture -Mechanical fall resulting in hip fracture -Orthopedics consulted, to OR tomorrow -NPO after midnight in anticipation of surgical repair tomorrow -SCDs overnight,  resume Xarelto post-operatively (or as per ortho) -Pain control with Robxain, Vicodin, and Morphine prn -SW consult for rehab placement -Will need PT consult post-operatively -Hip fracture order set utilized -Hold Evista for now  Chronic diastolic CHF -Appears to be compensated -Will be judicious  with IVF while NPO -Hold Lasix for now  Afib on Xarelto -Last dose of Xarelto was about 5pm on 2/3 -Resume when appropriate with orthopedics  DM -Diet controlled -Her last A1c in 04/2019 was 7.2 and so addition of PO medication is likely appropriate -Will order sensitive-scale SSI for while hospitalized  HTN -Continue Catapres  -Hold Lotensin for now  HLD -Continue Mevacor (Pravastatin formulary substitution)  GERD -Continue Protonix  Hypothyroidism -Check TSH -Continue Synthroid at current dose for now  Stage 3b CKD -Appears to be stable -Avoid nephrotoxic agents -Will hold Lotensin for now     Note: This patient has been tested and is negative for the novel coronavirus COVID-19.   DVT prophylaxis:  SCDs until approved for Lovenox by orthopedics Code Status:  Full- confirmed with patient Family Communication: None present; I discussed the patient with her son at the time of admission  Disposition Plan:  To be determined - the patient will either need SNF placement or HH therapies; since she lives alone, SNF seems more appropriate at this time Consults called: Orthopedics; TOC team, Nutrition; will need PT post-operatively  Admission status: Admit - It is my clinical opinion that admission to INPATIENT is reasonable and necessary because of the expectation that this patient will require hospital care that crosses at least 2 midnights to treat this condition based on the medical complexity of the problems presented.  Given the aforementioned information, the predictability of an adverse outcome is felt to be significant.   Karmen Bongo MD [facility]   How to contact the [facility] Attending or Consulting provider [place] or covering provider during after hours [place], for this patient?  1. Check the care team in [facility] and look for a) attending/consulting [facility] provider listed and b) the [facility] team listed 2. Log into [email or web address] and use [place]'s universal password  to access. If you do not have the password, please contact the hospital operator. 3. Locate the [facility] provider you are looking for under [facility] and page to a number that you can be directly reached. 4. If you still have difficulty reaching the provider, please page the [facility] (Director on Call) for the Hospitalists listed on amion for assistance.   09/22/2019, 4:39 PM

## 2019-09-22 NOTE — Progress Notes (Signed)
Orthopedic Tech Progress Note Patient Details:  Katie [name] Jul 16, 1932 [id number] I just applied traction with the assistance of the RN on 5N.. Musculoskeletal Traction Type of Traction: Bucks Skin Traction Traction Location: LLE Traction Weight: 5 lbs   Post Interventions Patient Tolerated: Well Instructions Provided: Care of device, Adjustment of device   Janit Pagan 09/22/2019, 4:30 PM

## 2019-09-22 NOTE — Anesthesia Preprocedure Evaluation (Addendum)
Anesthesia Evaluation  Patient identified by MRN, date of birth, ID band Patient awake    Reviewed: Allergy & Precautions, NPO status , Patient's Chart, lab work & pertinent test results  Airway Mallampati: II  TM Distance: >3 FB Neck ROM: Full    Dental no notable dental hx. (+) Implants, Upper Dentures   Pulmonary asthma ,    Pulmonary exam normal breath sounds clear to auscultation       Cardiovascular hypertension, Pt. on medications +CHF  + dysrhythmias Atrial Fibrillation  Rhythm:Irregular Rate:Abnormal     Neuro/Psych negative neurological ROS  negative psych ROS   GI/Hepatic Neg liver ROS, hiatal hernia, GERD  ,  Endo/Other  diabetes, Type 1Hypothyroidism   Renal/GU Renal disease  negative genitourinary   Musculoskeletal negative musculoskeletal ROS (+)   Abdominal   Peds  Hematology   Anesthesia Other Findings   Reproductive/Obstetrics                            Anesthesia Physical Anesthesia Plan  ASA: III  Anesthesia Plan: General   Post-op Pain Management:    Induction: Intravenous  PONV Risk Score and Plan: 3 and 4 or greater and Treatment may vary due to age or medical condition, Ondansetron and Dexamethasone  Airway Management Planned: LMA  Additional Equipment: None  Intra-op Plan:   Post-operative Plan:   Informed Consent: I have reviewed the patients History and Physical, chart, labs and discussed the procedure including the risks, benefits and alternatives for the proposed anesthesia with the patient or authorized representative who has indicated his/her understanding and acceptance.     Dental advisory given  Plan Discussed with: CRNA  Anesthesia Plan Comments:         Anesthesia Quick Evaluation

## 2019-09-22 NOTE — ED Notes (Signed)
Pt transported to xray 

## 2019-09-22 NOTE — Plan of Care (Signed)

## 2019-09-22 NOTE — ED Notes (Signed)
Report given to Rog RN

## 2019-09-22 NOTE — H&P (View-Only) (Signed)
Reason for Consult:Left hip fx Referring Physician: C Tegeler  Katie [name] is an 86 y.o. female.  HPI: Katie [name] was getting her car warmed up when her feet got caught on the gravel and she fell onto her left side. She had immediate pain and could not get up. She called her niece and EMS brought her to the ED. X-rays showed a left hip fx and orthopedic surgery was consulted. She c/o localized pain to the area.  Past Medical History:  Diagnosis Date  . Anxiety   . Aortic insufficiency    a. Trivial AI by echo 02/2016.  [name] Atrial fibrillation and flutter ([place])    a. Coarse afib vs flutter by EKG 12/2015.  [name] Cancer ([place])    skin cancer  . Cataract   . Chronic diastolic CHF (congestive heart failure) ([place])   . CKD (chronic kidney disease), stage III   . COVID-19   . Edema   . GERD (gastroesophageal reflux disease)   . Hiatal hernia   . Hypercholesterolemia   . Hypertension   . Hypokalemia   . Hypothyroidism   . Left knee pain   . NIDDM (non-insulin dependent diabetes mellitus)    diet controlled   . Obesity   . Osteoporosis   . Premature atrial contractions   . PVC's (premature ventricular contractions)   . URI (upper respiratory infection)   . Vitamin D deficiency     Past Surgical History:  Procedure Laterality Date  . A-FLUTTER ABLATION N/A 02/08/2019   Procedure: A-FLUTTER ABLATION;  Surgeon: Thompson Grayer, MD;  Location: [facility];  Service: Cardiovascular;  Laterality: N/A;  . ABDOMINAL HYSTERECTOMY    . APPENDECTOMY  1980  . BACK SURGERY    . CATARACT EXTRACTION, BILATERAL    . CHOLECYSTECTOMY  5/00  . COLONOSCOPY    . implantable loop recorder placement  04/06/2019   MDT Reveal LINQ [id number]) implanted for evaluation of palpitations and afib post atrial flutter ablation by Dr Rayann Heman in office  . TONSILLECTOMY    . TOTAL ABDOMINAL HYSTERECTOMY W/ BILATERAL SALPINGOOPHORECTOMY  1980  . UPPER GASTROINTESTINAL ENDOSCOPY      Family History  Problem  Relation Age of Onset  . Diabetes Mother   . Stroke Mother   . Heart disease Father   . Stroke Father   . Uterine cancer Sister   . Diabetes Sister   . Ovarian cancer Sister   . Colon cancer Sister   . Diabetes Sister   . Liver cancer Sister        \  . Diabetes Brother   . Dementia Brother   . Atrial fibrillation Sister   . Diabetes Sister   . Diabetes Son   . Healthy Son   . Heart attack Neg Hx   . Hypertension Neg Hx   . Esophageal cancer Neg Hx   . Rectal cancer Neg Hx   . Stomach cancer Neg Hx     Social History:  reports that she has never smoked. She has never used smokeless tobacco. She reports that she does not drink alcohol or use drugs.  Allergies:  Allergies  Allergen Reactions  . Amlodipine Swelling  . Macrodantin Nausea And Vomiting  . Metformin And Related Nausea And Vomiting and Other (See Comments)    Bloating  . Penicillins Rash    Did it involve swelling of the face/tongue/throat, SOB, or low BP? Unknown Did it involve sudden or severe rash/hives, skin peeling, or any reaction  on the inside of your mouth or nose? Yes Did you need to seek medical attention at a hospital or doctor's office? Yes When did it last happen? 2005  If all above answers are "NO", may proceed with cephalosporin use.     Medications: I have reviewed the patient's current medications.  Results for orders placed or performed during the hospital encounter of 09/22/19 (from the past 48 hour(s))  CBC WITH DIFFERENTIAL     Status: Abnormal   Collection Time: 09/22/19 10:35 AM  Result Value Ref Range   WBC 7.8 4.0 - 10.5 K/uL   RBC 3.42 (L) 3.87 - 5.11 MIL/uL   Hemoglobin 11.4 (L) 12.0 - 15.0 g/dL   HCT 34.7 (L) 36.0 - 46.0 %   MCV 101.5 (H) 80.0 - 100.0 fL   MCH 33.3 26.0 - 34.0 pg   MCHC 32.9 30.0 - 36.0 g/dL   RDW 13.0 11.5 - 15.5 %   Platelets 159 150 - 400 K/uL   nRBC 0.0 0.0 - 0.2 %   Neutrophils Relative % 72 %   Neutro Abs 5.6 1.7 - 7.7 K/uL   Lymphocytes Relative  21 %   Lymphs Abs 1.7 0.7 - 4.0 K/uL   Monocytes Relative 6 %   Monocytes Absolute 0.5 0.1 - 1.0 K/uL   Eosinophils Relative 1 %   Eosinophils Absolute 0.1 0.0 - 0.5 K/uL   Basophils Relative 0 %   Basophils Absolute 0.0 0.0 - 0.1 K/uL   Immature Granulocytes 0 %   Abs Immature Granulocytes 0.02 0.00 - 0.07 K/uL    Comment: Performed at [facility], [address], [place], [place] [address]  Protime-INR     Status: Abnormal   Collection Time: 09/22/19 10:35 AM  Result Value Ref Range   Prothrombin Time 19.1 (H) 11.4 - 15.2 seconds   INR 1.6 (H) 0.8 - 1.2    Comment: (NOTE) INR goal varies based on device and disease states. Performed at [facility], [place] [address] [place], [place] [address]   Comprehensive metabolic panel     Status: Abnormal   Collection Time: 09/22/19 10:35 AM  Result Value Ref Range   Sodium 139 135 - 145 mmol/L   Potassium 4.2 3.5 - 5.1 mmol/L   Chloride 103 98 - 111 mmol/L   CO2 24 22 - 32 mmol/L   Glucose, Bld 176 (H) 70 - 99 mg/dL   BUN 15 8 - 23 mg/dL   Creatinine, Ser 1.37 (H) 0.44 - 1.00 mg/dL   Calcium 8.9 8.9 - 10.3 mg/dL   Total Protein 6.0 (L) 6.5 - 8.1 g/dL   Albumin 3.5 3.5 - 5.0 g/dL   AST 22 15 - 41 U/L   ALT 13 0 - 44 U/L   Alkaline Phosphatase 62 38 - 126 U/L   Total Bilirubin 0.7 0.3 - 1.2 mg/dL   GFR calc non Af Amer 35 (L) >60 mL/min   GFR calc Af Amer 41 (L) >60 mL/min   Anion gap 12 5 - 15    Comment: Performed at [facility] [address] [place],  [address]  Type and screen [place]     Status: None   Collection Time: 09/22/19 10:40 AM  Result Value Ref Range   ABO/RH(D) O POS    Antibody Screen NEG    Sample Expiration      09/25/2019,2359 Performed at [facility], [place],  [place] [address]   ABO/Rh     Status: None   Collection Time: 09/22/19 10:40 AM  Result Value Ref Range   ABO/RH(D)      O POS Performed at [facility] [address] [place], [place] [address]     DG Chest 1 View  Result Date: 09/22/2019 CLINICAL DATA:  Hip fracture. EXAM: CHEST  1 VIEW COMPARISON:  None. FINDINGS: The heart size and mediastinal contours are within normal limits. Both lungs are clear. The visualized skeletal structures are unremarkable. IMPRESSION: No active disease. Electronically Signed   By: Marijo Conception M.D.   On: 09/22/2019 11:14   CT HEAD WO CONTRAST  Result Date: 09/22/2019 CLINICAL DATA:  Tripped and fell, left hip injury, anticoagulated for atrial fibrillation EXAM: CT HEAD WITHOUT CONTRAST TECHNIQUE: Contiguous axial images were obtained from the base of the skull through the vertex without intravenous contrast. COMPARISON:  None. FINDINGS: Brain: No acute infarct or hemorrhage. Lateral ventricles and midline structures are unremarkable. No acute extra-axial fluid collections. No mass effect. Vascular: No hyperdense vessel or unexpected calcification. Skull: Normal. Negative for fracture or focal lesion. Sinuses/Orbits: No acute finding. Other: None IMPRESSION: 1. No acute intracranial process.  No evidence of trauma. Electronically Signed   By: Randa Ngo M.D.   On: 09/22/2019 11:38   CT CERVICAL SPINE WO CONTRAST  Result Date: 09/22/2019 CLINICAL DATA:  [place] this morning, anticoagulated, left hip injury EXAM: CT CERVICAL SPINE WITHOUT CONTRAST TECHNIQUE: Multidetector CT imaging of the cervical spine was performed without intravenous contrast. Multiplanar CT image reconstructions were also generated. COMPARISON:  None. FINDINGS: Alignment: There is mild levoscoliosis of the cervical spine. Otherwise alignment is anatomic. Skull base and vertebrae: There are no acute displaced cervical spine fractures. Soft tissues and spinal canal: There is extensive atherosclerosis within the coronary vasculature. Prevertebral soft tissues are unremarkable. Airways patent. Disc levels: Multilevel cervical spondylosis is identified. Mild  circumferential disc osteophyte complex is seen from C3-4 through C6-7. There is a right predominant neural foraminal encroachment at these levels. Bony fusion across the bilateral facet joints at C2/C3. Upper chest: Lung apices are clear. Other: Reconstructed images demonstrate no additional findings. IMPRESSION: 1. No acute cervical spine fracture. 2. Multilevel cervical spondylosis. 3. Prominent atherosclerosis of the carotid bifurcations. Electronically Signed   By: Randa Ngo M.D.   On: 09/22/2019 11:42   DG Hip Unilat With Pelvis 2-3 Views Left  Result Date: 09/22/2019 CLINICAL DATA:  Left hip pain after fall. EXAM: DG HIP (WITH OR WITHOUT PELVIS) 2-3V LEFT COMPARISON:  None. FINDINGS: Severely displaced and comminuted fracture is seen involving the intertrochanteric region of the proximal left femur. Vascular calcifications are noted. IMPRESSION: Severely displaced and comminuted intertrochanteric fracture of proximal left femur. Electronically Signed   By: Marijo Conception M.D.   On: 09/22/2019 11:13    Review of Systems  HENT: Negative for ear discharge, ear pain, hearing loss and tinnitus.   Eyes: Negative for photophobia and pain.  Respiratory: Negative for cough and shortness of breath.   Cardiovascular: Negative for chest pain.  Gastrointestinal: Negative for abdominal pain, nausea and vomiting.  Genitourinary: Negative for dysuria, flank pain, frequency and urgency.  Musculoskeletal: Positive for arthralgias (Left hip). Negative for back pain, myalgias and neck pain.  Neurological: Negative for dizziness and headaches.  Hematological: Does not bruise/bleed easily.  Psychiatric/Behavioral: The patient is not nervous/anxious.    Blood pressure 134/69, pulse 64, temperature 99.3 F (37.4 C), temperature  source Oral, resp. rate 17, height 5\' 6"  (1.676 m), weight 72.6 kg, SpO2 100 %. Physical Exam  Constitutional: She appears well-developed and well-nourished. No distress.  HENT:   Head: Normocephalic and atraumatic.  Eyes: Conjunctivae are normal. Right eye exhibits no discharge. Left eye exhibits no discharge. No scleral icterus.  Cardiovascular: Normal rate and regular rhythm.  Respiratory: Effort normal. No respiratory distress.  Musculoskeletal:     Cervical back: Normal range of motion.     Comments: LLE No traumatic wounds, ecchymosis, or rash  Mod TTP hip  No knee or ankle effusion  Knee stable to varus/ valgus and anterior/posterior stress  Sens DPN, SPN, TN intact  Motor EHL, ext, flex, evers 5/5  DP 2+, PT 1+, No significant edema  Neurological: She is alert.  Skin: Skin is warm and dry. She is not diaphoretic.  Psychiatric: She has a normal mood and affect. Her behavior is normal.    Assessment/Plan: Left hip fx -- Plan IMN tomorrow with Dr. [name]. NPO after MN. Please hold Xarelto until after surgery. Multiple medical problems including atrial fibrillation and flutter on Xarelto, CKD, NIDDM, hypertension, hyperlipidemia, anxiety, and osteoporosis -- per medicine who will admit, manage, and clear. Appreciate their help.    Lisette Abu, PA-C Orthopedic Surgery [phone] 09/22/2019, 12:45 PM

## 2019-09-22 NOTE — ED Provider Notes (Signed)
[facility] EMERGENCY DEPARTMENT Provider Note   CSN: [id number] Arrival date & time: 09/22/19  1017     History No chief complaint on file.   Katie [name] is a 86 y.o. female who is a past medical history of atrial fibrillation and flutter on Xarelto, CKD, history of COVID-19 infection, NIDDM, hypertension, hyperlipidemia, anxiety, and osteoporosis who presents emergency department after mechanical fall resulting in severe left hip pain.  Patient was warming her car got out and slipped on rocks.  She fell onto her left side, had immediate severe pain in her left hip and was unable to get off of the ground.  She denies hitting her head or losing consciousness but "is not sure."  She is no previous injuries to this hip.  She has no other complaints at this time.  Rates her pain at 7 out of 10.  No numbness or tingling.  HPI     Past Medical History:  Diagnosis Date  . Anxiety   . Aortic insufficiency    a. Trivial AI by echo 02/2016.  [name] Atrial fibrillation and flutter ([place])    a. Coarse afib vs flutter by EKG 12/2015.  [name] Cancer ([place])    skin cancer  . Cataract   . Chronic diastolic CHF (congestive heart failure) ([place])   . CKD (chronic kidney disease), stage III   . COVID-19   . Edema   . GERD (gastroesophageal reflux disease)   . Hiatal hernia   . Hypercholesterolemia   . Hypertension   . Hypokalemia   . Hypothyroidism   . Left knee pain   . NIDDM (non-insulin dependent diabetes mellitus)    diet controlled   . Obesity   . Osteoporosis   . Premature atrial contractions   . PVC's (premature ventricular contractions)   . URI (upper respiratory infection)   . Vitamin D deficiency     Patient Active Problem List   Diagnosis Date Noted  . Regurgitation of food 07/20/2019  . Atrial flutter ([place]) 02/08/2019  . Chronic midline low back pain without sciatica 10/22/2018  . Primary insomnia 10/22/2018  . Persistent atrial fibrillation ([place]) 04/27/2017  .  Chronic diastolic CHF (congestive heart failure) ([place]) 04/27/2017  . CKD (chronic kidney disease) stage 3, GFR 30-59 ml/min 05/14/2015  . BMI 26.0-26.9,adult 05/11/2015  . Peripheral edema 07/06/2014  . Hypokalemia 07/22/2013  . Hypothyroidism 07/22/2013  . Osteopenia, senile 03/09/2013  . Constipation 01/04/2013  . Hypertension 05/07/2012  . Hyperlipidemia 05/07/2012  . Diabetes mellitus type 2, diet-controlled ([place]) 05/07/2012  . GERD (gastroesophageal reflux disease) 05/07/2012    Past Surgical History:  Procedure Laterality Date  . A-FLUTTER ABLATION N/A 02/08/2019   Procedure: A-FLUTTER ABLATION;  Surgeon: Thompson Grayer, MD;  Location: [facility];  Service: Cardiovascular;  Laterality: N/A;  . ABDOMINAL HYSTERECTOMY    . APPENDECTOMY  1980  . BACK SURGERY    . CATARACT EXTRACTION, BILATERAL    . CHOLECYSTECTOMY  5/00  . COLONOSCOPY    . implantable loop recorder placement  04/06/2019   MDT Reveal LINQ [id number]) implanted for evaluation of palpitations and afib post atrial flutter ablation by Dr Rayann Heman in office  . TONSILLECTOMY    . TOTAL ABDOMINAL HYSTERECTOMY W/ BILATERAL SALPINGOOPHORECTOMY  1980  . UPPER GASTROINTESTINAL ENDOSCOPY       OB History   No obstetric history on file.     Family History  Problem Relation Age of Onset  . Diabetes Mother   .  Stroke Mother   . Heart disease Father   . Stroke Father   . Uterine cancer Sister   . Diabetes Sister   . Ovarian cancer Sister   . Colon cancer Sister   . Diabetes Sister   . Liver cancer Sister        \  . Diabetes Brother   . Dementia Brother   . Atrial fibrillation Sister   . Diabetes Sister   . Diabetes Son   . Healthy Son   . Heart attack Neg Hx   . Hypertension Neg Hx   . Esophageal cancer Neg Hx   . Rectal cancer Neg Hx   . Stomach cancer Neg Hx     Social History   Tobacco Use  . Smoking status: Never Smoker  . Smokeless tobacco: Never Used  Substance Use Topics  .  Alcohol use: No  . Drug use: No    Home Medications Prior to Admission medications   Medication Sig Start Date End Date Taking? Authorizing Provider  albuterol (VENTOLIN HFA) 108 (90 Base) MCG/ACT inhaler INHALE TWO puffs by MOUTH every SIX hours as needed FOR wheezing OR shortness of breath. 08/13/19   [provider]  AMBULATORY NON FORMULARY MEDICATION GI cocktail 90 ml viscous lidocaine, 89ml-10mg /78ml dicyclomine, 266ml maalox Sig: Swish and swallow 53ml's by mouth four times daily 08/30/19   Gatha Mayer, MD  benazepril (LOTENSIN) 40 MG tablet Take 1.5 tablets (60 mg total) by mouth daily. 06/27/19   Hassell Done, Mary-Margaret, FNP  clonazePAM (KLONOPIN) 0.5 MG tablet Take 1 tablet (0.5 mg total) by mouth 2 (two) times daily as needed for anxiety. Patient taking differently: Take 0.25 mg by mouth at bedtime.  03/15/19   [name], FNP  cloNIDine (CATAPRES) 0.1 MG tablet Take 1 tablet by mouth twice daily 09/21/19   Hassell Done, Mary-Margaret, FNP  fluticasone (FLONASE) 50 MCG/ACT nasal spray Place 2 sprays into both nostrils daily. 06/21/19   Hassell Done Mary-Margaret, FNP  furosemide (LASIX) 40 MG tablet TAKE 1 TABLET BY MOUTH IN  THE MORNING DAILY AND 1/2  TABLET IN THE AFTERNOON 06/27/19   [name], FNP  levothyroxine (SYNTHROID) 50 MCG tablet TAKE 1 TABLET BY MOUTH  DAILY BEFORE BREAKFAST 06/27/19   Hassell Done, Mary-Margaret, FNP  loratadine (CLARITIN) 10 MG tablet Take 10 mg by mouth daily as needed for allergies.    [provider]  lovastatin (MEVACOR) 40 MG tablet TAKE 2 TABLETS BY MOUTH AT  BEDTIME 06/13/19   Hassell Done, Mary-Margaret, FNP  pantoprazole (PROTONIX) 40 MG tablet Take 1 tablet (40 mg total) by mouth daily. 07/04/19   Hassell Done, Mary-Margaret, FNP  potassium chloride SA (K-DUR) 20 MEQ tablet TAKE 1 TABLET BY MOUTH TWO  TIMES DAILY 05/02/19   Hassell Done, Mary-Margaret, FNP  Propylene Glycol (SYSTANE BALANCE) 0.6 % SOLN Place 1 drop into both eyes daily as  needed (dry eyes).    [provider]  raloxifene (EVISTA) 60 MG tablet Take 1 tablet (60 mg total) by mouth daily. 06/27/19   Hassell Done Mary-Margaret, FNP  Rivaroxaban (XARELTO) 15 MG TABS tablet Take 1 tablet (15 mg total) by mouth daily with supper. 06/08/19   Allred, Jeneen Rinks, MD  traMADol (ULTRAM) 50 MG tablet Take 1 tablet (50 mg total) by mouth 2 (two) times daily. 09/02/19 10/02/19  Hassell Done Mary-Margaret, FNP  traMADol (ULTRAM) 50 MG tablet Take 1 tablet (50 mg total) by mouth 2 (two) times daily. Patient not taking: Reported on 09/13/2019 10/02/19 11/01/19  [name], [place]acra  traMADol (ULTRAM) 50 MG tablet Take 1 tablet (50 mg total) by mouth 2 (two) times daily. Patient not taking: Reported on 09/13/2019 11/01/19 12/01/19  [name], FNP    Allergies    Amlodipine, Macrodantin, Metformin and related, and Penicillins  Review of Systems   Review of Systems Ten systems reviewed and are negative for acute change, except as noted in the HPI.   Physical Exam Updated Vital Signs BP (!) 150/79   Pulse 75   Temp 99.3 F (37.4 C) (Oral)   Resp 17   Ht 5\' 6"  (1.676 m)   Wt 72.6 kg   SpO2 100%   BMI 25.82 kg/m   Physical Exam Physical Exam  Nursing note and vitals reviewed. Constitutional: She is oriented to person, place, and time. She appears well-developed and well-nourished. No distress.  HENT:  Head: Normocephalic and atraumatic.  Eyes: Conjunctivae normal and EOM are normal. Pupils are equal, round, and reactive to light. No scleral icterus.  Neck: Normal range of motion.  Cardiovascular: Normal rate, regular rhythm and normal heart sounds.  Exam reveals no gallop and no friction rub.   No murmur heard. Pulmonary/Chest: Effort normal and breath sounds normal. No respiratory distress.  Abdominal: Soft. Bowel sounds are normal. She exhibits no distension and no mass. There is no tenderness. There is no guarding.  Neurological: She is alert and oriented to  person, place, and time.  Musculoskeletal: Left hip is lying in external rotation.  Severe pain with minimal palpation over the left hip.  Normal DP and PT pulses bilaterally with intact sensation. Skin: Skin is warm and dry. She is not diaphoretic.    ED Results / Procedures / Treatments   Labs (all labs ordered are listed, but only abnormal results are displayed) Labs Reviewed  RESPIRATORY PANEL BY RT PCR (FLU A&B, COVID)  CBC WITH DIFFERENTIAL/PLATELET  PROTIME-INR  COMPREHENSIVE METABOLIC PANEL  TYPE AND SCREEN    EKG EKG Interpretation  Date/Time:  Thursday September 22 2019 11:27:04 EST Ventricular Rate:  71 PR Interval:    QRS Duration: 91 QT Interval:  433 QTC Calculation: 471 R Axis:   32 Text Interpretation: Sinus rhythm When compared to prior, slower rate. NO STEMI Confirmed by Antony Blackbird [phone]) on 09/22/2019 12:23:20 PM   Radiology No results found.  Procedures Procedures (including critical care time)  Medications Ordered in ED Medications  fentaNYL (SUBLIMAZE) injection 50 mcg (has no administration in time range)  ondansetron (ZOFRAN) injection 4 mg (has no administration in time range)    ED Course  I have reviewed the triage vital signs and the nursing notes.  Pertinent labs & imaging results that were available during my care of the patient were reviewed by me and considered in my medical decision making (see chart for details).   CC: Left hip pain VS:  Vitals:   09/22/19 1300 09/22/19 1330 09/22/19 1400 09/22/19 1430  BP: (!) 144/41 (!) 146/62 (!) 153/61 106/71  Pulse: 71 73 69 95  Resp: 16 16 15 17   Temp:      TempSrc:      SpO2: 100% 100% 96% 100%  Weight:      Height:        [id number] is gathered by patient and EMS. DDX: Lumbar fracture, pelvic fracture, femur fracture, hip fracture, dislocation, sprain, radiculopathyCBC shows mildly low macrocytic anemia, INR of 1.6.  CMP shows elevated blood glucose of 176.  Serum creatinine of  137.  Lower GFR without significant abnormality  and BUN within normal limits. Labs: I reviewed the labs which show negative Covid and flu swab, oh positive blood, Imaging: I personally reviewed the images (CT head, CT C-spine, left hip and chest x-rays) which show(s) CT head, CT C-spine and chest x-ray without acute abnormality, hip x-ray shows a severely displaced and comminuted left intratrochanteric hip fracture. EKG: Normal sinus rhythm at a rate of 71 MDM: Patient with hip fracture, Patient will be admitted to the hospitalist service. Hilbert Odor has seen the patient here and expects patient to go to surgery tomorrow. Patient disposition: Admit Patient condition: Stable. The patient appears reasonably stabilized for admission considering the current resources, flow, and capabilities available in the ED at this time, and I doubt any other [facility] requiring further screening and/or treatment in the ED prior to admission.    MDM Rules/Calculators/A&P                         Final Clinical Impression(s) / ED Diagnoses Final diagnoses:  None    Rx / DC Orders ED Discharge Orders    None       Margarita Mail, PA-C 09/22/19 1502    Tegeler, Gwenyth Allegra, MD 09/23/19 [phone]

## 2019-09-22 NOTE — Consult Note (Signed)
Reason for Consult:Left hip fx Referring Physician: C Tegeler  Katie [name] is an 86 y.o. female.  HPI: Katie [name] was getting her car warmed up when her feet got caught on the gravel and she fell onto her left side. She had immediate pain and could not get up. She called her niece and EMS brought her to the ED. X-rays showed a left hip fx and orthopedic surgery was consulted. She c/o localized pain to the area.  Past Medical History:  Diagnosis Date  . Anxiety   . Aortic insufficiency    a. Trivial AI by echo 02/2016.  [name] Atrial fibrillation and flutter ([place])    a. Coarse afib vs flutter by EKG 12/2015.  [name] Cancer ([place])    skin cancer  . Cataract   . Chronic diastolic CHF (congestive heart failure) ([place])   . CKD (chronic kidney disease), stage III   . COVID-19   . Edema   . GERD (gastroesophageal reflux disease)   . Hiatal hernia   . Hypercholesterolemia   . Hypertension   . Hypokalemia   . Hypothyroidism   . Left knee pain   . NIDDM (non-insulin dependent diabetes mellitus)    diet controlled   . Obesity   . Osteoporosis   . Premature atrial contractions   . PVC's (premature ventricular contractions)   . URI (upper respiratory infection)   . Vitamin D deficiency     Past Surgical History:  Procedure Laterality Date  . A-FLUTTER ABLATION N/A 02/08/2019   Procedure: A-FLUTTER ABLATION;  Surgeon: Thompson Grayer, MD;  Location: [facility];  Service: Cardiovascular;  Laterality: N/A;  . ABDOMINAL HYSTERECTOMY    . APPENDECTOMY  1980  . BACK SURGERY    . CATARACT EXTRACTION, BILATERAL    . CHOLECYSTECTOMY  5/00  . COLONOSCOPY    . implantable loop recorder placement  04/06/2019   MDT Reveal LINQ [id number]) implanted for evaluation of palpitations and afib post atrial flutter ablation by Dr Rayann Heman in office  . TONSILLECTOMY    . TOTAL ABDOMINAL HYSTERECTOMY W/ BILATERAL SALPINGOOPHORECTOMY  1980  . UPPER GASTROINTESTINAL ENDOSCOPY      Family History  Problem  Relation Age of Onset  . Diabetes Mother   . Stroke Mother   . Heart disease Father   . Stroke Father   . Uterine cancer Sister   . Diabetes Sister   . Ovarian cancer Sister   . Colon cancer Sister   . Diabetes Sister   . Liver cancer Sister        \  . Diabetes Brother   . Dementia Brother   . Atrial fibrillation Sister   . Diabetes Sister   . Diabetes Son   . Healthy Son   . Heart attack Neg Hx   . Hypertension Neg Hx   . Esophageal cancer Neg Hx   . Rectal cancer Neg Hx   . Stomach cancer Neg Hx     Social History:  reports that she has never smoked. She has never used smokeless tobacco. She reports that she does not drink alcohol or use drugs.  Allergies:  Allergies  Allergen Reactions  . Amlodipine Swelling  . Macrodantin Nausea And Vomiting  . Metformin And Related Nausea And Vomiting and Other (See Comments)    Bloating  . Penicillins Rash    Did it involve swelling of the face/tongue/throat, SOB, or low BP? Unknown Did it involve sudden or severe rash/hives, skin peeling, or any reaction  on the inside of your mouth or nose? Yes Did you need to seek medical attention at a hospital or doctor's office? Yes When did it last happen? 2005  If all above answers are "NO", may proceed with cephalosporin use.     Medications: I have reviewed the patient's current medications.  Results for orders placed or performed during the hospital encounter of 09/22/19 (from the past 48 hour(s))  CBC WITH DIFFERENTIAL     Status: Abnormal   Collection Time: 09/22/19 10:35 AM  Result Value Ref Range   WBC 7.8 4.0 - 10.5 K/uL   RBC 3.42 (L) 3.87 - 5.11 MIL/uL   Hemoglobin 11.4 (L) 12.0 - 15.0 g/dL   HCT 34.7 (L) 36.0 - 46.0 %   MCV 101.5 (H) 80.0 - 100.0 fL   MCH 33.3 26.0 - 34.0 pg   MCHC 32.9 30.0 - 36.0 g/dL   RDW 13.0 11.5 - 15.5 %   Platelets 159 150 - 400 K/uL   nRBC 0.0 0.0 - 0.2 %   Neutrophils Relative % 72 %   Neutro Abs 5.6 1.7 - 7.7 K/uL   Lymphocytes Relative  21 %   Lymphs Abs 1.7 0.7 - 4.0 K/uL   Monocytes Relative 6 %   Monocytes Absolute 0.5 0.1 - 1.0 K/uL   Eosinophils Relative 1 %   Eosinophils Absolute 0.1 0.0 - 0.5 K/uL   Basophils Relative 0 %   Basophils Absolute 0.0 0.0 - 0.1 K/uL   Immature Granulocytes 0 %   Abs Immature Granulocytes 0.02 0.00 - 0.07 K/uL    Comment: Performed at [facility], [address], [place], [place] [address]  Protime-INR     Status: Abnormal   Collection Time: 09/22/19 10:35 AM  Result Value Ref Range   Prothrombin Time 19.1 (H) 11.4 - 15.2 seconds   INR 1.6 (H) 0.8 - 1.2    Comment: (NOTE) INR goal varies based on device and disease states. Performed at [facility], [place] [address], [place], [place] [address]   Comprehensive metabolic panel     Status: Abnormal   Collection Time: 09/22/19 10:35 AM  Result Value Ref Range   Sodium 139 135 - 145 mmol/L   Potassium 4.2 3.5 - 5.1 mmol/L   Chloride 103 98 - 111 mmol/L   CO2 24 22 - 32 mmol/L   Glucose, Bld 176 (H) 70 - 99 mg/dL   BUN 15 8 - 23 mg/dL   Creatinine, Ser 1.37 (H) 0.44 - 1.00 mg/dL   Calcium 8.9 8.9 - 10.3 mg/dL   Total Protein 6.0 (L) 6.5 - 8.1 g/dL   Albumin 3.5 3.5 - 5.0 g/dL   AST 22 15 - 41 U/L   ALT 13 0 - 44 U/L   Alkaline Phosphatase 62 38 - 126 U/L   Total Bilirubin 0.7 0.3 - 1.2 mg/dL   GFR calc non Af Amer 35 (L) >60 mL/min   GFR calc Af Amer 41 (L) >60 mL/min   Anion gap 12 5 - 15    Comment: Performed at [facility] [address], [place], [place] [address]  Type and screen [place]     Status: None   Collection Time: 09/22/19 10:40 AM  Result Value Ref Range   ABO/RH(D) O POS    Antibody Screen NEG    Sample Expiration      09/25/2019,2359 Performed at [facility], [place],  [place] [address]   ABO/Rh     Status: None   Collection Time: 09/22/19 10:40 AM  Result Value Ref Range   ABO/RH(D)      O POS Performed at [facility] [address], [place], [place] [address]     DG Chest 1 View  Result Date: 09/22/2019 CLINICAL DATA:  Hip fracture. EXAM: CHEST  1 VIEW COMPARISON:  None. FINDINGS: The heart size and mediastinal contours are within normal limits. Both lungs are clear. The visualized skeletal structures are unremarkable. IMPRESSION: No active disease. Electronically Signed   By: Marijo Conception M.D.   On: 09/22/2019 11:14   CT HEAD WO CONTRAST  Result Date: 09/22/2019 CLINICAL DATA:  Tripped and fell, left hip injury, anticoagulated for atrial fibrillation EXAM: CT HEAD WITHOUT CONTRAST TECHNIQUE: Contiguous axial images were obtained from the base of the skull through the vertex without intravenous contrast. COMPARISON:  None. FINDINGS: Brain: No acute infarct or hemorrhage. Lateral ventricles and midline structures are unremarkable. No acute extra-axial fluid collections. No mass effect. Vascular: No hyperdense vessel or unexpected calcification. Skull: Normal. Negative for fracture or focal lesion. Sinuses/Orbits: No acute finding. Other: None IMPRESSION: 1. No acute intracranial process.  No evidence of trauma. Electronically Signed   By: Randa Ngo M.D.   On: 09/22/2019 11:38   CT CERVICAL SPINE WO CONTRAST  Result Date: 09/22/2019 CLINICAL DATA:  [place] this morning, anticoagulated, left hip injury EXAM: CT CERVICAL SPINE WITHOUT CONTRAST TECHNIQUE: Multidetector CT imaging of the cervical spine was performed without intravenous contrast. Multiplanar CT image reconstructions were also generated. COMPARISON:  None. FINDINGS: Alignment: There is mild levoscoliosis of the cervical spine. Otherwise alignment is anatomic. Skull base and vertebrae: There are no acute displaced cervical spine fractures. Soft tissues and spinal canal: There is extensive atherosclerosis within the coronary vasculature. Prevertebral soft tissues are unremarkable. Airways patent. Disc levels: Multilevel cervical spondylosis is identified. Mild  circumferential disc osteophyte complex is seen from C3-4 through C6-7. There is a right predominant neural foraminal encroachment at these levels. Bony fusion across the bilateral facet joints at C2/C3. Upper chest: Lung apices are clear. Other: Reconstructed images demonstrate no additional findings. IMPRESSION: 1. No acute cervical spine fracture. 2. Multilevel cervical spondylosis. 3. Prominent atherosclerosis of the carotid bifurcations. Electronically Signed   By: Randa Ngo M.D.   On: 09/22/2019 11:42   DG Hip Unilat With Pelvis 2-3 Views Left  Result Date: 09/22/2019 CLINICAL DATA:  Left hip pain after fall. EXAM: DG HIP (WITH OR WITHOUT PELVIS) 2-3V LEFT COMPARISON:  None. FINDINGS: Severely displaced and comminuted fracture is seen involving the intertrochanteric region of the proximal left femur. Vascular calcifications are noted. IMPRESSION: Severely displaced and comminuted intertrochanteric fracture of proximal left femur. Electronically Signed   By: Marijo Conception M.D.   On: 09/22/2019 11:13    Review of Systems  HENT: Negative for ear discharge, ear pain, hearing loss and tinnitus.   Eyes: Negative for photophobia and pain.  Respiratory: Negative for cough and shortness of breath.   Cardiovascular: Negative for chest pain.  Gastrointestinal: Negative for abdominal pain, nausea and vomiting.  Genitourinary: Negative for dysuria, flank pain, frequency and urgency.  Musculoskeletal: Positive for arthralgias (Left hip). Negative for back pain, myalgias and neck pain.  Neurological: Negative for dizziness and headaches.  Hematological: Does not bruise/bleed easily.  Psychiatric/Behavioral: The patient is not nervous/anxious.    Blood pressure 134/69, pulse 64, temperature 99.3 F (37.4 C), temperature  source Oral, resp. rate 17, height 5\' 6"  (1.676 m), weight 72.6 kg, SpO2 100 %. Physical Exam  Constitutional: She appears well-developed and well-nourished. No distress.  HENT:   Head: Normocephalic and atraumatic.  Eyes: Conjunctivae are normal. Right eye exhibits no discharge. Left eye exhibits no discharge. No scleral icterus.  Cardiovascular: Normal rate and regular rhythm.  Respiratory: Effort normal. No respiratory distress.  Musculoskeletal:     Cervical back: Normal range of motion.     Comments: LLE No traumatic wounds, ecchymosis, or rash  Mod TTP hip  No knee or ankle effusion  Knee stable to varus/ valgus and anterior/posterior stress  Sens DPN, SPN, TN intact  Motor EHL, ext, flex, evers 5/5  DP 2+, PT 1+, No significant edema  Neurological: She is alert.  Skin: Skin is warm and dry. She is not diaphoretic.  Psychiatric: She has a normal mood and affect. Her behavior is normal.    Assessment/Plan: Left hip fx -- Plan IMN tomorrow with Dr. [name]. NPO after MN. Please hold Xarelto until after surgery. Multiple medical problems including atrial fibrillation and flutter on Xarelto, CKD, NIDDM, hypertension, hyperlipidemia, anxiety, and osteoporosis -- per medicine who will admit, manage, and clear. Appreciate their help.    Lisette Abu, PA-C Orthopedic Surgery [phone] 09/22/2019, 12:45 PM

## 2019-09-23 ENCOUNTER — Inpatient Hospital Stay (HOSPITAL_COMMUNITY): Payer: Medicare Other | Admitting: Anesthesiology

## 2019-09-23 ENCOUNTER — Encounter (HOSPITAL_COMMUNITY): Payer: Self-pay | Admitting: Internal Medicine

## 2019-09-23 ENCOUNTER — Inpatient Hospital Stay (HOSPITAL_COMMUNITY): Payer: Medicare Other

## 2019-09-23 ENCOUNTER — Encounter (HOSPITAL_COMMUNITY)
Admission: EM | Disposition: A | Discharge status: Skilled Nursing Facility | Payer: Self-pay | Source: Home / Self Care | Attending: Internal Medicine

## 2019-09-23 DIAGNOSIS — I480 Paroxysmal atrial fibrillation: Secondary | ICD-10-CM

## 2019-09-23 HISTORY — PX: INTRAMEDULLARY (IM) NAIL INTERTROCHANTERIC: SHX5875

## 2019-09-23 LAB — URINALYSIS, ROUTINE W REFLEX MICROSCOPIC
Bilirubin Urine: NEGATIVE
Glucose, UA: NEGATIVE mg/dL
Hgb urine dipstick: NEGATIVE
Ketones, ur: 5 mg/dL — AB
Leukocytes,Ua: NEGATIVE
Nitrite: NEGATIVE
Protein, ur: NEGATIVE mg/dL
Specific Gravity, Urine: 1.017 (ref 1.005–1.030)
pH: 5 (ref 5.0–8.0)

## 2019-09-23 LAB — VITAMIN D 25 HYDROXY (VIT D DEFICIENCY, FRACTURES): Vit D, 25-Hydroxy: 23.77 ng/mL — ABNORMAL LOW (ref 30–100)

## 2019-09-23 LAB — BASIC METABOLIC PANEL
Anion gap: 13 (ref 5–15)
BUN: 15 mg/dL (ref 8–23)
CO2: 27 mmol/L (ref 22–32)
Calcium: 8.8 mg/dL — ABNORMAL LOW (ref 8.9–10.3)
Chloride: 100 mmol/L (ref 98–111)
Creatinine, Ser: 1.45 mg/dL — ABNORMAL HIGH (ref 0.44–1.00)
GFR calc Af Amer: 38 mL/min — ABNORMAL LOW (ref >60)
GFR calc non Af Amer: 33 mL/min — ABNORMAL LOW (ref >60)
Glucose, Bld: 130 mg/dL — ABNORMAL HIGH (ref 70–99)
Potassium: 4.1 mmol/L (ref 3.5–5.1)
Sodium: 140 mmol/L (ref 135–145)

## 2019-09-23 LAB — GLUCOSE, CAPILLARY
Glucose-Capillary: 120 mg/dL — ABNORMAL HIGH (ref 70–99)
Glucose-Capillary: 115 mg/dL — ABNORMAL HIGH (ref 70–99)
Glucose-Capillary: 127 mg/dL — ABNORMAL HIGH (ref 70–99)
Glucose-Capillary: 132 mg/dL — ABNORMAL HIGH (ref 70–99)
Glucose-Capillary: 192 mg/dL — ABNORMAL HIGH (ref 70–99)
Glucose-Capillary: 162 mg/dL — ABNORMAL HIGH (ref 70–99)

## 2019-09-23 LAB — CBC
HCT: 29.2 % — ABNORMAL LOW (ref 36.0–46.0)
Hemoglobin: 9.7 g/dL — ABNORMAL LOW (ref 12.0–15.0)
MCH: 33.4 pg (ref 26.0–34.0)
MCHC: 33.2 g/dL (ref 30.0–36.0)
MCV: 100.7 fL — ABNORMAL HIGH (ref 80.0–100.0)
Platelets: 155 10*3/uL (ref 150–400)
RBC: 2.9 MIL/uL — ABNORMAL LOW (ref 3.87–5.11)
RDW: 13.2 % (ref 11.5–15.5)
WBC: 7 10*3/uL (ref 4.0–10.5)
nRBC: 0 % (ref 0.0–0.2)

## 2019-09-23 SURGERY — FIXATION, FRACTURE, INTERTROCHANTERIC, WITH INTRAMEDULLARY ROD
Anesthesia: General | Site: Leg Upper | Laterality: Left

## 2019-09-23 MED ORDER — ONDANSETRON HCL 4 MG/2ML IJ SOLN
INTRAMUSCULAR | Status: DC | PRN
  Administered 2019-09-23: 4 mg via INTRAVENOUS

## 2019-09-23 MED ORDER — PHENYLEPHRINE 40 MCG/ML (10ML) SYRINGE FOR IV PUSH (FOR BLOOD PRESSURE SUPPORT)
PREFILLED_SYRINGE | INTRAVENOUS | Status: AC
  Filled 2019-09-23: qty 10

## 2019-09-23 MED ORDER — FENTANYL CITRATE (PF) 100 MCG/2ML IJ SOLN
INTRAMUSCULAR | Status: AC
  Filled 2019-09-23: qty 2

## 2019-09-23 MED ORDER — PROPOFOL 10 MG/ML IV BOLUS
INTRAVENOUS | Status: DC | PRN
  Administered 2019-09-23: 60 mg via INTRAVENOUS

## 2019-09-23 MED ORDER — ROCURONIUM BROMIDE 100 MG/10ML IV SOLN
INTRAVENOUS | Status: DC | PRN
  Administered 2019-09-23: 30 mg via INTRAVENOUS
  Administered 2019-09-23: 40 mg via INTRAVENOUS

## 2019-09-23 MED ORDER — CLONIDINE HCL 0.1 MG PO TABS
0.1000 mg | ORAL_TABLET | Freq: Every day | ORAL | Status: DC
  Administered 2019-09-25 – 2019-09-27 (×3): 0.1 mg via ORAL
  Filled 2019-09-23 (×4): qty 1

## 2019-09-23 MED ORDER — PROPOFOL 10 MG/ML IV BOLUS
INTRAVENOUS | Status: AC
  Filled 2019-09-23: qty 20

## 2019-09-23 MED ORDER — VANCOMYCIN HCL 1000 MG IV SOLR
INTRAVENOUS | Status: AC
  Filled 2019-09-23: qty 1000

## 2019-09-23 MED ORDER — DEXMEDETOMIDINE HCL 200 MCG/2ML IV SOLN
INTRAVENOUS | Status: DC | PRN
  Administered 2019-09-23: 12 ug via INTRAVENOUS

## 2019-09-23 MED ORDER — FENTANYL CITRATE (PF) 100 MCG/2ML IJ SOLN
25.0000 ug | INTRAMUSCULAR | Status: DC | PRN
  Administered 2019-09-23: 25 ug via INTRAVENOUS

## 2019-09-23 MED ORDER — TOBRAMYCIN SULFATE 1.2 G IJ SOLR
INTRAMUSCULAR | Status: AC
  Filled 2019-09-23: qty 1.2

## 2019-09-23 MED ORDER — PHENYLEPHRINE HCL-NACL 10-0.9 MG/250ML-% IV SOLN
INTRAVENOUS | Status: DC | PRN
  Administered 2019-09-23: 50 ug/min via INTRAVENOUS
  Administered 2019-09-23: 25 ug/min via INTRAVENOUS

## 2019-09-23 MED ORDER — LACTATED RINGERS IV SOLN: INTRAVENOUS | Status: DC

## 2019-09-23 MED ORDER — FENTANYL CITRATE (PF) 250 MCG/5ML IJ SOLN
INTRAMUSCULAR | Status: DC | PRN
  Administered 2019-09-23 (×2): 50 ug via INTRAVENOUS

## 2019-09-23 MED ORDER — ONDANSETRON HCL 4 MG PO TABS
4.0000 mg | ORAL_TABLET | Freq: Three times a day (TID) | ORAL | Status: DC | PRN
  Administered 2019-09-24: 10:00:00 4 mg via ORAL
  Filled 2019-09-23: qty 1

## 2019-09-23 MED ORDER — ACETAMINOPHEN 10 MG/ML IV SOLN
INTRAVENOUS | Status: DC | PRN
  Administered 2019-09-23: 1000 mg via INTRAVENOUS

## 2019-09-23 MED ORDER — DEXAMETHASONE SODIUM PHOSPHATE 10 MG/ML IJ SOLN
INTRAMUSCULAR | Status: DC | PRN
  Administered 2019-09-23: 5 mg via INTRAVENOUS

## 2019-09-23 MED ORDER — LIDOCAINE 2% (20 MG/ML) 5 ML SYRINGE
INTRAMUSCULAR | Status: DC | PRN
  Administered 2019-09-23: 80 mg via INTRAVENOUS

## 2019-09-23 MED ORDER — ONDANSETRON HCL 4 MG/2ML IJ SOLN: 4.0000 mg | Freq: Once | INTRAMUSCULAR | Status: DC | PRN

## 2019-09-23 MED ORDER — 0.9 % SODIUM CHLORIDE (POUR BTL) OPTIME
TOPICAL | Status: DC | PRN
  Administered 2019-09-23: 11:00:00 1000 mL

## 2019-09-23 MED ORDER — VANCOMYCIN HCL 1000 MG IV SOLR
INTRAVENOUS | Status: DC | PRN
  Administered 2019-09-23: 1000 mg

## 2019-09-23 MED ORDER — GLUCERNA SHAKE PO LIQD
237.0000 mL | Freq: Two times a day (BID) | ORAL | Status: DC
  Administered 2019-09-24: 09:00:00 237 mL via ORAL

## 2019-09-23 MED ORDER — DEXAMETHASONE SODIUM PHOSPHATE 10 MG/ML IJ SOLN
INTRAMUSCULAR | Status: AC
  Filled 2019-09-23: qty 2

## 2019-09-23 MED ORDER — ADULT MULTIVITAMIN W/MINERALS CH
1.0000 | ORAL_TABLET | Freq: Every day | ORAL | Status: DC
  Administered 2019-09-24 – 2019-09-28 (×5): 1 via ORAL
  Filled 2019-09-23 (×5): qty 1

## 2019-09-23 MED ORDER — SENNOSIDES-DOCUSATE SODIUM 8.6-50 MG PO TABS
1.0000 | ORAL_TABLET | Freq: Every day | ORAL | Status: DC
  Administered 2019-09-23 – 2019-09-27 (×5): 1 via ORAL
  Filled 2019-09-23 (×5): qty 1

## 2019-09-23 MED ORDER — ONDANSETRON HCL 4 MG/2ML IJ SOLN
INTRAMUSCULAR | Status: AC
  Filled 2019-09-23: qty 4

## 2019-09-23 MED ORDER — SUGAMMADEX SODIUM 200 MG/2ML IV SOLN
INTRAVENOUS | Status: DC | PRN
  Administered 2019-09-23: 200 mg via INTRAVENOUS

## 2019-09-23 MED ORDER — FENTANYL CITRATE (PF) 250 MCG/5ML IJ SOLN
INTRAMUSCULAR | Status: AC
  Filled 2019-09-23: qty 5

## 2019-09-23 MED ORDER — MORPHINE SULFATE (PF) 2 MG/ML IV SOLN
0.5000 mg | INTRAVENOUS | Status: DC | PRN
  Administered 2019-09-23 – 2019-09-28 (×6): 1 mg via INTRAVENOUS
  Filled 2019-09-23 (×6): qty 1

## 2019-09-23 MED ORDER — HYDROCODONE-ACETAMINOPHEN 5-325 MG PO TABS
1.0000 | ORAL_TABLET | Freq: Four times a day (QID) | ORAL | Status: DC | PRN
  Administered 2019-09-23 – 2019-09-24 (×2): 1 via ORAL
  Filled 2019-09-23 (×2): qty 1

## 2019-09-23 MED ORDER — ACETAMINOPHEN 10 MG/ML IV SOLN: 1000.0000 mg | Freq: Once | INTRAVENOUS | Status: DC | PRN

## 2019-09-23 MED ORDER — CLINDAMYCIN PHOSPHATE 900 MG/50ML IV SOLN
900.0000 mg | Freq: Three times a day (TID) | INTRAVENOUS | Status: AC
  Administered 2019-09-23 – 2019-09-24 (×3): 900 mg via INTRAVENOUS
  Filled 2019-09-23 (×3): qty 50

## 2019-09-23 SURGICAL SUPPLY — 51 items
ADH SKN CLS APL DERMABOND .7 (GAUZE/BANDAGES/DRESSINGS) ×1
APL PRP STRL LF DISP 70% ISPRP (MISCELLANEOUS) ×1
BIT DRILL SHORT 4.0 (BIT) IMPLANT
BRUSH SCRUB EZ PLAIN DRY (MISCELLANEOUS) ×4 IMPLANT
CHLORAPREP W/TINT 26 (MISCELLANEOUS) ×2 IMPLANT
COVER PERINEAL POST (MISCELLANEOUS) ×2 IMPLANT
COVER SURGICAL LIGHT HANDLE (MISCELLANEOUS) ×2 IMPLANT
COVER WAND RF STERILE (DRAPES) IMPLANT
DERMABOND ADVANCED (GAUZE/BANDAGES/DRESSINGS) ×1
DERMABOND ADVANCED .7 DNX12 (GAUZE/BANDAGES/DRESSINGS) ×1 IMPLANT
DRAPE C-ARM 35X43 STRL (DRAPES) ×2 IMPLANT
DRAPE IMP U-DRAPE 54X76 (DRAPES) ×4 IMPLANT
DRAPE INCISE IOBAN 66X45 STRL (DRAPES) ×3 IMPLANT
DRAPE STERI IOBAN 125X83 (DRAPES) ×2 IMPLANT
DRAPE SURG 17X23 STRL (DRAPES) ×4 IMPLANT
DRAPE U-SHAPE 47X51 STRL (DRAPES) ×2 IMPLANT
DRILL BIT SHORT 4.0 (BIT) ×2
DRSG MEPILEX BORD LITE 2X5 (GAUZE/BANDAGES/DRESSINGS) ×2 IMPLANT
DRSG MEPILEX BORDER 4X4 (GAUZE/BANDAGES/DRESSINGS) ×2 IMPLANT
DRSG MEPILEX BORDER 4X8 (GAUZE/BANDAGES/DRESSINGS) ×1 IMPLANT
ELECT REM PT RETURN 9FT ADLT (ELECTROSURGICAL) ×2
ELECTRODE REM PT RTRN 9FT ADLT (ELECTROSURGICAL) ×1 IMPLANT
GLOVE BIO SURGEON STRL SZ 6.5 (GLOVE) ×6 IMPLANT
GLOVE BIO SURGEON STRL SZ7.5 (GLOVE) ×8 IMPLANT
GLOVE BIOGEL PI IND STRL 6.5 (GLOVE) ×1 IMPLANT
GLOVE BIOGEL PI IND STRL 7.5 (GLOVE) ×1 IMPLANT
GLOVE BIOGEL PI INDICATOR 6.5 (GLOVE) ×1
GLOVE BIOGEL PI INDICATOR 7.5 (GLOVE) ×1
GOWN STRL REUS W/ TWL LRG LVL3 (GOWN DISPOSABLE) ×1 IMPLANT
GOWN STRL REUS W/TWL LRG LVL3 (GOWN DISPOSABLE) ×10
GUIDE PIN 3.2X343 (PIN) ×2
GUIDE PIN 3.2X343MM (PIN) ×4
GUIDE ROD 3.0 (MISCELLANEOUS) ×2
KIT BASIN OR (CUSTOM PROCEDURE TRAY) ×2 IMPLANT
KIT TURNOVER KIT B (KITS) ×2 IMPLANT
MANIFOLD NEPTUNE II (INSTRUMENTS) ×1 IMPLANT
NAIL LOCK CANN 10X400 130D LT (Nail) ×1 IMPLANT
NS IRRIG 1000ML POUR BTL (IV SOLUTION) ×2 IMPLANT
PACK GENERAL/GYN (CUSTOM PROCEDURE TRAY) ×2 IMPLANT
PAD ARMBOARD 7.5X6 YLW CONV (MISCELLANEOUS) ×4 IMPLANT
PIN GUIDE 3.2X343MM (PIN) IMPLANT
ROD GUIDE 3.0 (MISCELLANEOUS) IMPLANT
SCREW LAG COMPR KIT 95/90 (Screw) ×1 IMPLANT
SCREW TRIGEN LOW PROF 5.0X47.5 (Screw) ×1 IMPLANT
SUT MNCRL AB 3-0 PS2 18 (SUTURE) ×2 IMPLANT
SUT VIC AB 0 CT1 27 (SUTURE) ×2
SUT VIC AB 0 CT1 27XBRD ANBCTR (SUTURE) IMPLANT
SUT VIC AB 2-0 CT1 27 (SUTURE) ×2
SUT VIC AB 2-0 CT1 TAPERPNT 27 (SUTURE) ×2 IMPLANT
TOWEL GREEN STERILE (TOWEL DISPOSABLE) ×4 IMPLANT
WATER STERILE IRR 1000ML POUR (IV SOLUTION) ×2 IMPLANT

## 2019-09-23 NOTE — Evaluation (Signed)
Physical Therapy Evaluation Patient Details Name: Katie [name] MRN: [id number] DOB: 07/11/1932 Today's Date: 09/23/2019   History of Present Illness  86 yo female admitted to ED on 2/4 with fall, s/p IM nail L femur on 2/5. CT head and spine negative for acute changes. PMH includes atrial fibrillation and flutter on Xarelto, CKD, history of recent COVID-19 infection, NIDDM, hypertension, hyperlipidemia, anxiety, HF, and osteoporosis  Clinical Impression   Pt presents with LLE pain, LE weakness, significant difficulty performing bed mobility, inability to transfer OOB on eval due to pain and fatigue, and decreased activity tolearnce. Pt to benefit from acute PT to address deficits. Pt required mod-max assist for supine<>sit, pt limited in tolerance for sitting EOB due to feeling weak and faint, improved with return to supine. Per pt, she would like to d/c home with her son and daughter in law, who could provide combined 24/7 assist, pending pt progress HHPT with 24/7 vs SNF. PT to progress mobility as tolerated, and will continue to follow acutely.      Follow Up Recommendations Home health PT;SNF;Supervision/Assistance - 24 hour    Equipment Recommendations  Rolling walker with 5" wheels    Recommendations for Other Services       Precautions / Restrictions Precautions Precautions: Fall Restrictions Weight Bearing Restrictions: No LLE Weight Bearing: Weight bearing as tolerated      Mobility  Bed Mobility Overal bed mobility: Needs Assistance Bed Mobility: Supine to Sit;Sit to Supine     Supine to sit: Mod assist;HOB elevated Sit to supine: Max assist;HOB elevated   General bed mobility comments: mod-max assist for trunk and LE management, scooting to and from EOB with use of bed pad, and scooting pt up with bed pad. Verbal cuing for sequencing, very increased time to perform.  Transfers                 General transfer comment: unable - pt reporting feeling faint  upon sitting up secondary to severe pain  Ambulation/Gait             General Gait Details: unable  Stairs            Wheelchair Mobility    Modified Rankin (Stroke Patients Only)       Balance Overall balance assessment: Needs assistance;History of Falls Sitting-balance support: Bilateral upper extremity supported;Feet supported Sitting balance-Leahy Scale: Fair Sitting balance - Comments: able to sit EOB without PT support, but must utilize UE support       Standing balance comment: unable to assess this session                             Pertinent Vitals/Pain Pain Assessment: Faces Faces Pain Scale: Hurts whole lot Pain Location: L hip, with mobility Pain Descriptors / Indicators: Discomfort;Grimacing;Crying Pain Intervention(s): Limited activity within patient's tolerance;Monitored during session;Repositioned    Home Living Family/patient expects to be discharged to:: Private residence Living Arrangements: Alone Available Help at Discharge: Family;Available 24 hours/day(d/c to son's house) Type of Home: House Home Access: Stairs to enter Entrance Stairs-Rails: (unsure of which side per pt and pt's son) Technical brewer of Steps: 2 Home Layout: One level Home Equipment: Cane - single point;Bedside commode      Prior Function Level of Independence: Independent         Comments: pt reports driving, performing all ADLs for self. At time of fall, pt states she felt weaker than baseline due to  recent covid infection in December.     Hand Dominance   Dominant Hand: Right    Extremity/Trunk Assessment   Upper Extremity Assessment Upper Extremity Assessment: Defer to OT evaluation    Lower Extremity Assessment Lower Extremity Assessment: Generalized weakness;LLE deficits/detail LLE Deficits / Details: able to perform limited DF/PF, quad set; limited by pain    Cervical / Trunk Assessment Cervical / Trunk Assessment: Normal   Communication   Communication: HOH  Cognition Arousal/Alertness: Awake/alert Behavior During Therapy: WFL for tasks assessed/performed Overall Cognitive Status: Within Functional Limits for tasks assessed                                        General Comments      Exercises General Exercises - Lower Extremity Ankle Circles/Pumps: AROM;Both;10 reps;Supine   Assessment/Plan    PT Assessment Patient needs continued PT services  PT Problem List Decreased strength;Decreased mobility;Decreased range of motion;Decreased safety awareness;Decreased activity tolerance;Decreased balance;Decreased knowledge of use of DME;Pain       PT Treatment Interventions DME instruction;Therapeutic activities;Gait training;Therapeutic exercise;Patient/family education;Balance training;Stair training;Functional mobility training    PT Goals (Current goals can be found in the Care Plan section)  Acute Rehab PT Goals Patient Stated Goal: go home PT Goal Formulation: With patient Time For Goal Achievement: 10/07/19 Potential to Achieve Goals: Good    Frequency Min 5X/week   Barriers to discharge        Co-evaluation               AM-PAC PT "6 Clicks" Mobility  Outcome Measure Help needed turning from your back to your side while in a flat bed without using bedrails?: A Lot Help needed moving from lying on your back to sitting on the side of a flat bed without using bedrails?: A Lot Help needed moving to and from a bed to a chair (including a wheelchair)?: Total Help needed standing up from a chair using your arms (e.g., wheelchair or bedside chair)?: Total Help needed to walk in hospital room?: Total Help needed climbing 3-5 steps with a railing? : Total 6 Click Score: 8    End of Session Equipment Utilized During Treatment: Gait belt Activity Tolerance: Patient limited by fatigue;Patient limited by pain Patient left: in bed;with call bell/phone within reach;with bed  alarm set Nurse Communication: Mobility status PT Visit Diagnosis: Other abnormalities of gait and mobility (R26.89);Muscle weakness (generalized) (M62.81)    Time: [id number] PT Time Calculation (min) (ACUTE ONLY): 26 min   Charges:   PT Evaluation $PT Eval Low Complexity: 1 Low PT Treatments $Therapeutic Activity: 8-22 mins       Malayah Demuro E, PT Acute Rehabilitation Services Pager [phone]  Office [phone]   Magan Winnett D Perel Hauschild 09/23/2019, 5:50 PM

## 2019-09-23 NOTE — Progress Notes (Signed)
PROGRESS NOTE  Katie [name] [id number] DOB: 28-Jan-1932 DOA: 09/22/2019 PCP: [name], FNP  Brief summary:  Katie [name] is a 86 y.o. female with medical history significant of diet-controlled DM; hypothyroidism; HTN; HLD;  Stage 3 CKD; chronic diastolic CHF; and afib on Xarelto presenting with a fall.  On Thursday AM, she gets her hair done.  She went out to start her car to warm it up.  When she got out, she slipped on gravel and slid onto the ground.  She was unable to get up.  She had her phone and called her niece and they called 91.  She was very cold, lying on the ground.  She felt fine this AM prior to the fall.  Her last dose of Xarelto was about 5pm on 2/3.    She reports that she has had a very hard time since her husband died in 11-01-18.  She had dysphagia and was admitted at [facility] from 12/16-19 with GERD; she was found to have COVID-19 infection and an E coli UTI.  She had an EGD with Dr. Carlean Purl on 1/26 and was found to have dysmotility/presbyesophagus and a Hill Grade III gastroesophageal flap.     HPI/Recap of past 24 hours:  Patient is seen after returned from ER She denies acute complaints Son at bedside   Assessment/Plan: Principal Problem:   Hip fracture ([place]) Active Problems:   Hypertension   Hyperlipidemia   Diabetes mellitus type 2, diet-controlled (HCC)   GERD (gastroesophageal reflux disease)   Hypothyroidism   CKD (chronic kidney disease) stage 3, GFR 30-59 ml/min   Persistent atrial fibrillation (HCC)   Chronic diastolic CHF (congestive heart failure) (HCC)  Left hip fx --after mechanical fall xarelto held prior to surgery  IMN  with Dr. [name] today  resume xarelto when ok with ortho Ortho recommended weightbearing as tolerated to the left lower extremity Management per ortho  Macrocytic anemia Anemia work up  [employer], h/o ablation of aflutter followed by Dr Rayann Heman chronic diastolic chf with preserved lvef currently sinus  rhythm Denies chest pain, no sob, no edema  Diet controlled DM2, h/o metformin intolerance  Fasting blood glucose 130 On ssi here F/u with pcp  HTN -bp low normal, decreased Catapres  From bid to qhs -Hold Lotensin for now  CKDIII Appear baseline  HLD -Continue Mevacor (Pravastatin formulary substitution)  GERD -Continue Protonix  H/o esophageal dysmotility: Son reports patient has been having trouble swallowing since she got sick from covid in 07/2019. Patient has lost 20lbs since then She is followed by gi Dr Carlean Purl   Hypothyroidism -Check TSH -Continue Synthroid at current dose for now   DVT Prophylaxis:resume xarelto when ok with ortho  Code Status: full  Family Communication: patient and son at bedside   Disposition Plan: needs Pt eval , need ortho clearance, likely will need CIR vs snf, prior to admission, she is independent, lives alone, she does has good family supports from her sons.   Consultants:  ortho  Procedures:  IMN left hip on 2/5  Antibiotics:  Perioperative ancef on 2/5   Objective: BP (!) 103/58 (BP Location: Left Arm)   Pulse 86   Temp 98.1 F (36.7 C) (Oral)   Resp 18   Ht 5\' 6"  (1.676 m)   Wt 72.6 kg   SpO2 94%   BMI 25.82 kg/m   Intake/Output Summary (Last 24 hours) at 09/23/2019 [id number] Last data filed at 09/23/2019 0600 Gross per 24 hour  Intake  32.17 ml  Output 525 ml  Net -492.83 ml   Filed Weights   09/22/19 1022  Weight: 72.6 kg    Exam: Patient is examined daily including today on 09/23/2019, exams remain the same as of yesterday except that has changed    General:  NAD  Cardiovascular: RRR  Respiratory: CTABL  Abdomen: Soft/ND/NT, positive BS  Musculoskeletal: left hip post op changes , no edema  Neuro: alert, oriented   Data Reviewed: Basic Metabolic Panel: Recent Labs  Lab 09/22/19 1035 09/23/19 0240  NA 139 140  K 4.2 4.1  CL 103 100  CO2 24 27  GLUCOSE 176* 130*  BUN 15 15    CREATININE 1.37* 1.45*  CALCIUM 8.9 8.8*   Liver Function Tests: Recent Labs  Lab 09/22/19 1035  AST 22  ALT 13  ALKPHOS 62  BILITOT 0.7  PROT 6.0*  ALBUMIN 3.5   No results for input(s): LIPASE, AMYLASE in the last 168 hours. No results for input(s): AMMONIA in the last 168 hours. CBC: Recent Labs  Lab 09/22/19 1035 09/23/19 0240  WBC 7.8 7.0  NEUTROABS 5.6  --   HGB 11.4* 9.7*  HCT 34.7* 29.2*  MCV 101.5* 100.7*  PLT 159 155   Cardiac Enzymes:   No results for input(s): CKTOTAL, CKMB, CKMBINDEX, TROPONINI in the last 168 hours. BNP (last 3 results) Recent Labs    01/28/19 1032  BNP 627.0*    ProBNP (last 3 results) No results for input(s): PROBNP in the last 8760 hours.  CBG: Recent Labs  Lab 09/22/19 1748 09/22/19 2046 09/23/19 0407 09/23/19 0639  GLUCAP 163* 144* 120* 115*    Recent Results (from the past 240 hour(s))  Respiratory Panel by RT PCR (Flu A&B, Covid) - Nasopharyngeal Swab     Status: None   Collection Time: 09/22/19 11:32 AM   Specimen: Nasopharyngeal Swab  Result Value Ref Range Status   SARS Coronavirus 2 by RT PCR NEGATIVE NEGATIVE Final    Comment: (NOTE) SARS-CoV-2 target nucleic acids are NOT DETECTED. The SARS-CoV-2 RNA is generally detectable in upper respiratoy specimens during the acute phase of infection. The lowest concentration of SARS-CoV-2 viral copies this assay can detect is 131 copies/mL. A negative result does not preclude SARS-Cov-2 infection and should not be used as the sole basis for treatment or other patient management decisions. A negative result may occur with  improper specimen collection/handling, submission of specimen other than nasopharyngeal swab, presence of viral mutation(s) within the areas targeted by this assay, and inadequate number of viral copies (<131 copies/mL). A negative result must be combined with clinical observations, patient history, and epidemiological information. The expected  result is Negative. Fact Sheet for Patients:  PinkCheek.be Fact Sheet for Healthcare Providers:  GravelBags.it This test is not yet ap proved or cleared by the [place] FDA and  has been authorized for detection and/or diagnosis of SARS-CoV-2 by FDA under an Emergency Use Authorization (EUA). This EUA will remain  in effect (meaning this test can be used) for the duration of the COVID-19 declaration under Section 564(b)(1) of the Act, 21 U.S.C. section 360bbb-3(b)(1), unless the authorization is terminated or revoked sooner.    Influenza A by PCR NEGATIVE NEGATIVE Final   Influenza B by PCR NEGATIVE NEGATIVE Final    Comment: (NOTE) The Xpert Xpress SARS-CoV-2/FLU/RSV assay is intended as an aid in  the diagnosis of influenza from Nasopharyngeal swab specimens and  should not be used as a sole basis for  treatment. Nasal washings and  aspirates are unacceptable for Xpert Xpress SARS-CoV-2/FLU/RSV  testing. Fact Sheet for Patients: PinkCheek.be Fact Sheet for Healthcare Providers: GravelBags.it This test is not yet approved or cleared by the [place] FDA and  has been authorized for detection and/or diagnosis of SARS-CoV-2 by  FDA under an Emergency Use Authorization (EUA). This EUA will remain  in effect (meaning this test can be used) for the duration of the  Covid-19 declaration under Section 564(b)(1) of the Act, 21  U.S.C. section 360bbb-3(b)(1), unless the authorization is  terminated or revoked. Performed at [facility], [place] [address], [place], [place] [address]   Surgical pcr screen     Status: None   Collection Time: 09/22/19  5:32 PM   Specimen: Nasal Mucosa; Nasal Swab  Result Value Ref Range Status   MRSA, PCR NEGATIVE NEGATIVE Final   Staphylococcus aureus NEGATIVE NEGATIVE Final    Comment: (NOTE) The Xpert SA Assay (FDA approved for  NASAL specimens in patients 4 years of age and older), is one component of a comprehensive surveillance program. It is not intended to diagnose infection nor to guide or monitor treatment. Performed at [facility], [place] [address] [place], [place] [address]      Studies: DG Chest 1 View  Result Date: 09/22/2019 CLINICAL DATA:  Hip fracture. EXAM: CHEST  1 VIEW COMPARISON:  None. FINDINGS: The heart size and mediastinal contours are within normal limits. Both lungs are clear. The visualized skeletal structures are unremarkable. IMPRESSION: No active disease. Electronically Signed   By: Marijo Conception M.D.   On: 09/22/2019 11:14   CT HEAD WO CONTRAST  Result Date: 09/22/2019 CLINICAL DATA:  Tripped and fell, left hip injury, anticoagulated for atrial fibrillation EXAM: CT HEAD WITHOUT CONTRAST TECHNIQUE: Contiguous axial images were obtained from the base of the skull through the vertex without intravenous contrast. COMPARISON:  None. FINDINGS: Brain: No acute infarct or hemorrhage. Lateral ventricles and midline structures are unremarkable. No acute extra-axial fluid collections. No mass effect. Vascular: No hyperdense vessel or unexpected calcification. Skull: Normal. Negative for fracture or focal lesion. Sinuses/Orbits: No acute finding. Other: None IMPRESSION: 1. No acute intracranial process.  No evidence of trauma. Electronically Signed   By: Randa Ngo M.D.   On: 09/22/2019 11:38   CT CERVICAL SPINE WO CONTRAST  Result Date: 09/22/2019 CLINICAL DATA:  [place] this morning, anticoagulated, left hip injury EXAM: CT CERVICAL SPINE WITHOUT CONTRAST TECHNIQUE: Multidetector CT imaging of the cervical spine was performed without intravenous contrast. Multiplanar CT image reconstructions were also generated. COMPARISON:  None. FINDINGS: Alignment: There is mild levoscoliosis of the cervical spine. Otherwise alignment is anatomic. Skull base and vertebrae: There are no acute displaced  cervical spine fractures. Soft tissues and spinal canal: There is extensive atherosclerosis within the coronary vasculature. Prevertebral soft tissues are unremarkable. Airways patent. Disc levels: Multilevel cervical spondylosis is identified. Mild circumferential disc osteophyte complex is seen from C3-4 through C6-7. There is a right predominant neural foraminal encroachment at these levels. Bony fusion across the bilateral facet joints at C2/C3. Upper chest: Lung apices are clear. Other: Reconstructed images demonstrate no additional findings. IMPRESSION: 1. No acute cervical spine fracture. 2. Multilevel cervical spondylosis. 3. Prominent atherosclerosis of the carotid bifurcations. Electronically Signed   By: Randa Ngo M.D.   On: 09/22/2019 11:42   DG Hip Unilat With Pelvis 2-3 Views Left  Result Date: 09/22/2019 CLINICAL DATA:  Left hip pain after fall.  EXAM: DG HIP (WITH OR WITHOUT PELVIS) 2-3V LEFT COMPARISON:  None. FINDINGS: Severely displaced and comminuted fracture is seen involving the intertrochanteric region of the proximal left femur. Vascular calcifications are noted. IMPRESSION: Severely displaced and comminuted intertrochanteric fracture of proximal left femur. Electronically Signed   By: Marijo Conception M.D.   On: 09/22/2019 11:13    Scheduled Meds: . clonazePAM  0.25 mg Oral QHS  . cloNIDine  0.1 mg Oral BID  . docusate sodium  100 mg Oral BID  . fluticasone  2 spray Each Nare Daily  . insulin aspart  0-15 Units Subcutaneous TID WC  . levothyroxine  50 mcg Oral QAC breakfast  . pantoprazole  40 mg Oral Daily  . povidone-iodine  2 application Topical Once  . pravastatin  40 mg Oral q1800  . traMADol  50 mg Oral BID    Continuous Infusions: . clindamycin (CLEOCIN) IV    . lactated ringers 50 mL/hr at 09/23/19 0000  . methocarbamol (ROBAXIN) IV       Time spent: 19mins I have personally reviewed and interpreted on  09/23/2019 daily labs, imagings as discussed above  under date review session and assessment and plans.  I reviewed all nursing notes, pharmacy notes, consultant notes,  vitals, pertinent old records  I have discussed plan of care as described above with RN , patient and family on 09/23/2019   Florencia Reasons MD, PhD, FACP  [facility]  Available via Epic secure chat 7am-7pm for nonurgent issues Please page for urgent issues, pager number available through [email or web address] .   09/23/2019, 7:07 AM  LOS: 1 day

## 2019-09-23 NOTE — Anesthesia Postprocedure Evaluation (Signed)
Anesthesia Post Note  Patient: Katie [name]  Procedure(s) Performed: INTRAMEDULLARY (IM) NAIL INTERTROCHANTRIC (Left Leg Upper)     Patient location during evaluation: PACU Anesthesia Type: General Level of consciousness: awake and alert Pain management: pain level controlled Vital Signs Assessment: post-procedure vital signs reviewed and stable Respiratory status: spontaneous breathing, nonlabored ventilation, respiratory function stable and patient connected to nasal cannula oxygen Cardiovascular status: blood pressure returned to baseline and stable Postop Assessment: no apparent nausea or vomiting Anesthetic complications: no    Last Vitals:  Vitals:   09/23/19 1320 09/23/19 1335  BP: (!) 116/58 (!) 117/56  Pulse: 72 77  Resp: 13 11  Temp:    SpO2: 93% 94%    Last Pain:  Vitals:   09/23/19 1320  TempSrc:   PainSc: [place] [employer] [place]

## 2019-09-23 NOTE — Progress Notes (Signed)
Initial Nutrition Assessment  DOCUMENTATION CODES:   Not applicable  INTERVENTION:   -Once diet is advanced, add:  -Glucerna Shake po BID, each supplement provides 220 kcal and 10 grams of protein -MVI with minerals daily  NUTRITION DIAGNOSIS:   Increased nutrient needs related to post-op healing as evidenced by estimated needs.  GOAL:   Patient will meet greater than or equal to 90% of their needs  MONITOR:   PO intake, Supplement acceptance, Diet advancement, Labs, Weight trends, Skin, I & O's  REASON FOR ASSESSMENT:   Consult Hip fracture protocol  ASSESSMENT:   KHAWLA BROADAWAY is a 86 y.o. female with medical history significant of diet-controlled DM; hypothyroidism; HTN; HLD;  Stage 3 CKD; chronic diastolic CHF; and afib on Xarelto presenting with a fall.  On Thursday AM, she gets her hair done.  She went out to start her car to warm it up.  When she got out, she slipped on gravel and slid onto the ground.  She was unable to get up.  She had her phone and called her niece and they called 49.  She was very cold, lying on the ground.  She felt fine this AM prior to the fall.  Her last dose of Xarelto was about 5pm on 2/3.  Pt admitted with lt femur fx.   Reviewed I/O's: -493 ml x 24 hours   UOP: 525 ml x 24 hours  Per orthopedics notes, plan for nailing today.   Attempted ot see pt x 2, however, in OR at times of visits. Unable to speak with pt to obtain further nutrition-related history at this time. No family present in room.   Pt currently NPO due to surgical procedure.   Reviewed wt hx; pt has experienced a 2.4% wt loss over the past 6 months, which is not significant for time frame.   Pt with increased nutrient needs due to post-operative healing and would greatly benefit from addition of nutritional supplements once diet is advanced.   Medications reviewed and include lactated ringers infusion @ 50 ml/hr.   Lab Results  Component Value Date   HGBA1C 7.2  (H) 04/22/2019  PTA DM medications are none. Per ADA's Standards of Medical Care for Diabetes, glycemic targets for elderly patients who are otherwise healthy (few medical impairments) and cognitively intact should be less stringent (Hgb A1c <7.5).   Labs reviewed: CBGS: 115-163 (inpatient orders for glycemic control are 0-15 units insulin aspart TID with meals).   Diet Order:   Diet Order            Diet NPO time specified  Diet effective midnight              EDUCATION NEEDS:   No education needs have been identified at this time  Skin:  Skin Assessment: Reviewed RN Assessment  Last BM:  09/22/19  Height:   Ht Readings from Last 1 Encounters:  09/22/19 5\' 6"  (1.676 m)    Weight:   Wt Readings from Last 1 Encounters:  09/22/19 72.6 kg    Ideal Body Weight:  59.1 kg  BMI:  Body mass index is 25.82 kg/m.  Estimated Nutritional Needs:   Kcal:  1650-1850  Protein:  75-90 grams  Fluid:  > 1.6 L    Loistine Chance, RD, LDN, [place] Registered Dietitian II Certified Diabetes Care and Education Specialist Please refer to [facility] for RD and/or RD on-call/weekend/after hours pager

## 2019-09-23 NOTE — Transfer of Care (Signed)
Immediate Anesthesia Transfer of Care Note  Patient: Katie [name]  Procedure(s) Performed: INTRAMEDULLARY (IM) NAIL INTERTROCHANTRIC (Left Leg Upper)  Patient Location: PACU  Anesthesia Type:General  Level of Consciousness: awake  Airway & Oxygen Therapy: Patient Spontanous Breathing and Patient connected to nasal cannula oxygen  Post-op Assessment: Report given to RN and Post -op Vital signs reviewed and stable  Post vital signs: Reviewed and stable  Last Vitals:  Vitals Value Taken Time  BP 93/48 09/23/19 1221  Temp 36.7 C 09/23/19 1221  Pulse 71 09/23/19 1223  Resp 12 09/23/19 1224  SpO2 88 % 09/23/19 1223  Vitals shown include unvalidated device data.  Last Pain:  Vitals:   09/23/19 0821  TempSrc: Oral  PainSc:          Complications: No apparent anesthesia complications

## 2019-09-23 NOTE — Interval H&P Note (Signed)
History and Physical Interval Note:  09/23/2019 10:05 AM  Katie [name]  has presented today for surgery, with the diagnosis of Left hip fx.  The various methods of treatment have been discussed with the patient and family. After consideration of risks, benefits and other options for treatment, the patient has consented to  Procedure(s): INTRAMEDULLARY (IM) NAIL INTERTROCHANTRIC (Left) as a surgical intervention.  The patient's history has been reviewed, patient examined, no change in status, stable for surgery.  I have reviewed the patient's chart and labs.  Questions were answered to the patient's satisfaction.     Lennette Bihari P Chaley Castellanos

## 2019-09-23 NOTE — Anesthesia Procedure Notes (Signed)
Procedure Name: Intubation Date/Time: 09/23/2019 10:40 AM Performed by: Clearnce Sorrel, CRNA Pre-anesthesia Checklist: Patient identified, Emergency Drugs available, Suction available, Patient being monitored and Timeout performed Patient Re-evaluated:Patient Re-evaluated prior to induction Oxygen Delivery Method: Circle system utilized Preoxygenation: Pre-oxygenation with 100% oxygen Induction Type: IV induction Ventilation: Mask ventilation without difficulty Laryngoscope Size: Mac and 3 Grade View: Grade I Tube type: Oral Tube size: 7.0 mm Number of attempts: 1 Airway Equipment and Method: Stylet Placement Confirmation: ETT inserted through vocal cords under direct vision,  positive ETCO2 and breath sounds checked- equal and bilateral Secured at: 22 cm Tube secured with: Tape Dental Injury: Teeth and Oropharynx as per pre-operative assessment

## 2019-09-23 NOTE — Op Note (Signed)
Orthopaedic Surgery Operative Note (CSN: [id number] ) Date of Surgery: 09/23/2019  Admit Date: 09/22/2019   Diagnoses: Pre-Op Diagnoses: Left 4-part intertrochanteric femur fracture  Post-Op Diagnosis: Same  Procedures: CPT 27245-Cephalomedullary nailing of left intertrochanteric femur fracture  Surgeons : Primary: Shona Needles, MD  Assistant: Patrecia Pace, PA-C  Location: OR 3   Anesthesia:General  Antibiotics: Ancef 2g preop, 1 gram vancomycin powder topically   Tourniquet time:None  Estimated Blood 123456 mL  Complications:None   Specimens:None   Implants: Implant Name Type Inv. Item Serial No. Manufacturer Lot No. LRB No. Used Action  NAIL LOCK CANN 10X400 130D LT - [id number] Nail NAIL LOCK CANN 10X400 130D LT  [employer] [id number] Left 1 Implanted  SCREW LAG COMBO 95.90 - [id number] Screw SCREW LAG COMBO 95.90  [employer] [id number] Left 1 Implanted  SCREW TRIGEN LOW PROF 5.0X47.5 - [id number] Screw SCREW TRIGEN LOW PROF 5.0X47.5  [employer] [id number] Left 1 Implanted     Indications for Surgery: 86 year old female who sustained a ground-level fall.  She had a left intertrochanteric femur fracture with subtrochanteric extension.  She was on Xarelto for A. Fib.  Due to the unstable nature of her injury I recommended proceeding with cephalomedullary nailing of the left intertrochanteric femur fracture.  Risks and benefits were discussed with the patient and her son.  Risks include but not limited to bleeding, infection, malunion, nonunion, hardware failure, hardware irritation, nerve or blood vessel injury, DVT, even the possibility anesthetic complications.  The patient agreed to proceed with surgery and consent was obtained.  Operative Findings: Cephalomedullary nailing of left intertrochanteric femur fracture using [employer] InterTAN 10 x 400 mm with a 95 mm lag screw combo with 1 distal interlock  screw  Procedure: The patient was identified in the preoperative holding area. Consent was confirmed with the patient and their family and all questions were answered. The operative extremity was marked after confirmation with the patient. she was then brought back to the operating room by our anesthesia colleagues.  She is placed under general anesthetic and carefully transferred over to a Hana table.  All bony prominences were well-padded.  Her feet were positioned in traction boot and padded as well.  Reduction maneuver was performed using traction and gentle internal rotation.  Fluoroscopic imaging confirmed adequate reduction of the intertrochanteric femur fracture.  The left lower extremity was then prepped and draped in usual sterile fashion.  A timeout was performed to verify the patient, the procedure, and the extremity.  Preoperative antibiotics were dosed.  They percutaneous incision was made proximal to the greater trochanter.  I split the gluteal fascia in line with my incision.  I then directed a threaded guidepin at the tip of the greater trochanter into the metaphyseal bone.  I confirmed placement with AP and lateral fluoroscopic imaging.  I then entered the canal using a entry reamer.  There was some displacement of the lateral wall of the proximal femur that I used a percutaneous incision a ball spike pusher to reduce anatomically.  I then passed a ball-tipped guidewire down the center of the canal and measured the length of the nail which was 400 mm.  I reamed using a 10 mm reamer and did not obtain much chatter and I felt that a 10 mm nail was appropriate.  The nail was passed down the center canal.  Appropriate aligned on the AP view and extend my percutaneous incision for the reduction of  the lateral wall and directed a threaded guidepin up into the head neck segment.  I confirmed adequate tip apex distance on AP and lateral fluoroscopic imaging.  I measured and chose a 95 mm lag screw  combo.  I drilled the path for the compression screw and placed an antirotation bar.  I then drilled the path for the lag screw and then proceeded to place the 95 mm lag screw.  The compression screw was placed and a small amount of compression was provided to the construct.  I then statically locked the set screw.  I confirmed placement of the device with AP and lateral fluoroscopic imaging.  Using perfect circle technique I then placed a distal interlocking screw from lateral to medial.  I confirmed positioning with AP and lateral fluoroscopic imaging.  Final fluoroscopic imaging was obtained.  The incisions were copiously irrigated.  A gram of vancomycin powder was put between the incisions.  I then performed a layer closure of 2-0 Vicryl 3-0 Monocryl and Dermabond.  Sterile dressing was placed.  The patient was awoken from anesthesia and taken the PACU in stable condition.  Post Op Plan/Instructions: Patient will be weightbearing as tolerated to the left lower extremity.  She will receive postoperative Ancef.  She may be started on Xarelto postoperative day 1 in the morning if her hemoglobin is stable.  We will mobilize her with physical and Occupational Therapy.  I was present and performed the entire surgery.  Patrecia Pace, PA-C did assist me throughout the case. An assistant was necessary given the difficulty in approach, maintenance of reduction and ability to instrument the fracture.   Katha Hamming, MD [facility]

## 2019-09-24 LAB — CBC
HCT: 22.2 % — ABNORMAL LOW (ref 36.0–46.0)
HCT: 23.3 % — ABNORMAL LOW (ref 36.0–46.0)
Hemoglobin: 7.4 g/dL — ABNORMAL LOW (ref 12.0–15.0)
Hemoglobin: 7.9 g/dL — ABNORMAL LOW (ref 12.0–15.0)
MCH: 33 pg (ref 26.0–34.0)
MCH: 33.9 pg (ref 26.0–34.0)
MCHC: 33.3 g/dL (ref 30.0–36.0)
MCHC: 33.9 g/dL (ref 30.0–36.0)
MCV: 99.1 fL (ref 80.0–100.0)
MCV: 100 fL (ref 80.0–100.0)
Platelets: 134 10*3/uL — ABNORMAL LOW (ref 150–400)
Platelets: 121 10*3/uL — ABNORMAL LOW (ref 150–400)
RBC: 2.24 MIL/uL — ABNORMAL LOW (ref 3.87–5.11)
RBC: 2.33 MIL/uL — ABNORMAL LOW (ref 3.87–5.11)
RDW: 13.1 % (ref 11.5–15.5)
RDW: 13.2 % (ref 11.5–15.5)
WBC: 9.4 10*3/uL (ref 4.0–10.5)
WBC: 10.4 10*3/uL (ref 4.0–10.5)
nRBC: 0 % (ref 0.0–0.2)
nRBC: 0 % (ref 0.0–0.2)

## 2019-09-24 LAB — BASIC METABOLIC PANEL
Anion gap: 9 (ref 5–15)
BUN: 18 mg/dL (ref 8–23)
CO2: 26 mmol/L (ref 22–32)
Calcium: 8.5 mg/dL — ABNORMAL LOW (ref 8.9–10.3)
Chloride: 102 mmol/L (ref 98–111)
Creatinine, Ser: 1.28 mg/dL — ABNORMAL HIGH (ref 0.44–1.00)
GFR calc Af Amer: 44 mL/min — ABNORMAL LOW (ref >60)
GFR calc non Af Amer: 38 mL/min — ABNORMAL LOW (ref >60)
Glucose, Bld: 118 mg/dL — ABNORMAL HIGH (ref 70–99)
Potassium: 4.3 mmol/L (ref 3.5–5.1)
Sodium: 137 mmol/L (ref 135–145)

## 2019-09-24 LAB — IRON AND TIBC
Iron: 27 ug/dL — ABNORMAL LOW (ref 28–170)
Saturation Ratios: 11 % (ref 10.4–31.8)
TIBC: 235 ug/dL — ABNORMAL LOW (ref 250–450)
UIBC: 208 ug/dL

## 2019-09-24 LAB — TSH: TSH: 2.162 u[IU]/mL (ref 0.350–4.500)

## 2019-09-24 LAB — URINE CULTURE

## 2019-09-24 LAB — FOLATE: Folate: 9.3 ng/mL (ref >5.9)

## 2019-09-24 LAB — GLUCOSE, CAPILLARY
Glucose-Capillary: 129 mg/dL — ABNORMAL HIGH (ref 70–99)
Glucose-Capillary: 116 mg/dL — ABNORMAL HIGH (ref 70–99)
Glucose-Capillary: 120 mg/dL — ABNORMAL HIGH (ref 70–99)
Glucose-Capillary: 140 mg/dL — ABNORMAL HIGH (ref 70–99)

## 2019-09-24 LAB — VITAMIN B12: Vitamin B-12: 54 pg/mL — ABNORMAL LOW (ref 180–914)

## 2019-09-24 LAB — FERRITIN: Ferritin: 131 ng/mL (ref 11–307)

## 2019-09-24 MED ORDER — RIVAROXABAN 15 MG PO TABS
15.0000 mg | ORAL_TABLET | Freq: Every day | ORAL | Status: DC
  Administered 2019-09-24: 15 mg via ORAL
  Filled 2019-09-24: qty 1

## 2019-09-24 MED ORDER — HYDROCODONE-ACETAMINOPHEN 5-325 MG PO TABS
1.0000 | ORAL_TABLET | Freq: Four times a day (QID) | ORAL | Status: DC | PRN
  Administered 2019-09-25 – 2019-09-28 (×12): 1 via ORAL
  Filled 2019-09-24 (×12): qty 1

## 2019-09-24 MED ORDER — KETOROLAC TROMETHAMINE 15 MG/ML IJ SOLN
15.0000 mg | Freq: Four times a day (QID) | INTRAMUSCULAR | Status: AC
  Administered 2019-09-24 (×2): 15 mg via INTRAVENOUS
  Filled 2019-09-24 (×4): qty 1

## 2019-09-24 MED ORDER — VITAMIN D 25 MCG (1000 UNIT) PO TABS
2000.0000 [IU] | ORAL_TABLET | Freq: Every day | ORAL | Status: DC
  Administered 2019-09-24 – 2019-09-28 (×5): 2000 [IU] via ORAL
  Filled 2019-09-24 (×6): qty 2

## 2019-09-24 MED ORDER — GLUCERNA SHAKE PO LIQD
237.0000 mL | Freq: Three times a day (TID) | ORAL | Status: DC
  Administered 2019-09-26 – 2019-09-27 (×4): 237 mL via ORAL

## 2019-09-24 MED ORDER — BENAZEPRIL HCL 5 MG PO TABS
5.0000 mg | ORAL_TABLET | Freq: Every day | ORAL | Status: DC
  Administered 2019-09-24 – 2019-09-28 (×5): 5 mg via ORAL
  Filled 2019-09-24 (×5): qty 1

## 2019-09-24 MED ORDER — LACTATED RINGERS IV SOLN: INTRAVENOUS | Status: AC

## 2019-09-24 MED ORDER — SODIUM CHLORIDE 0.9 % IV BOLUS
1000.0000 mL | Freq: Once | INTRAVENOUS | Status: AC
  Administered 2019-09-24: 14:00:00 1000 mL via INTRAVENOUS

## 2019-09-24 MED ORDER — VITAMIN B-12 1000 MCG PO TABS
1000.0000 ug | ORAL_TABLET | Freq: Every day | ORAL | Status: DC
  Administered 2019-09-24 – 2019-09-28 (×5): 1000 ug via ORAL
  Filled 2019-09-24 (×5): qty 1

## 2019-09-24 MED ORDER — ONDANSETRON HCL 4 MG/2ML IJ SOLN
4.0000 mg | Freq: Four times a day (QID) | INTRAMUSCULAR | Status: DC | PRN
  Administered 2019-09-24 – 2019-09-26 (×2): 4 mg via INTRAVENOUS
  Filled 2019-09-24 (×2): qty 2

## 2019-09-24 NOTE — Progress Notes (Signed)
Orthopaedic Trauma Progress Note  S: States her leg is really sore. No other issues this AM  O:  Vitals:   09/24/19 0445 09/24/19 0755  BP: 140/66 119/65  Pulse: 60 91  Resp: 17 15  Temp: 97.9 F (36.6 C) 99 F (37.2 C)  SpO2: 95% 94%   Gen: NAD, AAO LLE: Incisions and dressings clean, dry and intact. Mild bruising. Does not tolerate much hip or knee ROM. Neurovascularly intact distally  Imaging: Stable postop imaging  Labs:  Results for orders placed or performed during the hospital encounter of 09/22/19 (from the past 24 hour(s))  Glucose, capillary     Status: Abnormal   Collection Time: 09/23/19  9:24 AM  Result Value Ref Range   Glucose-Capillary 127 (H) 70 - 99 mg/dL  Glucose, capillary     Status: Abnormal   Collection Time: 09/23/19 12:24 PM  Result Value Ref Range   Glucose-Capillary 132 (H) 70 - 99 mg/dL   Comment 1 Notify RN   VITAMIN D 25 Hydroxy (Vit-D Deficiency, Fractures)     Status: Abnormal   Collection Time: 09/23/19  2:31 PM  Result Value Ref Range   Vit D, 25-Hydroxy 23.77 (L) 30 - 100 ng/mL  Glucose, capillary     Status: Abnormal   Collection Time: 09/23/19  4:32 PM  Result Value Ref Range   Glucose-Capillary 192 (H) 70 - 99 mg/dL  Urinalysis, Routine w reflex microscopic     Status: Abnormal   Collection Time: 09/23/19  5:40 PM  Result Value Ref Range   Color, Urine YELLOW YELLOW   APPearance CLEAR CLEAR   Specific Gravity, Urine 1.017 1.005 - 1.030   pH 5.0 5.0 - 8.0   Glucose, UA NEGATIVE NEGATIVE mg/dL   Hgb urine dipstick NEGATIVE NEGATIVE   Bilirubin Urine NEGATIVE NEGATIVE   Ketones, ur 5 (A) NEGATIVE mg/dL   Protein, ur NEGATIVE NEGATIVE mg/dL   Nitrite NEGATIVE NEGATIVE   Leukocytes,Ua NEGATIVE NEGATIVE  Glucose, capillary     Status: Abnormal   Collection Time: 09/23/19  8:35 PM  Result Value Ref Range   Glucose-Capillary 162 (H) 70 - 99 mg/dL  Glucose, capillary     Status: Abnormal   Collection Time: 09/24/19  6:36 AM   Result Value Ref Range   Glucose-Capillary 129 (H) 70 - 99 mg/dL    Assessment: 86 yo female s/p ground level fall  Injuries: Left intertrochanteric femur fracture s/p IMN  Weightbearing: WBAT LLE  Insicional and dressing care: Dressings to be changed POD 2/3  Orthopedic device(s):None needed  CV/Blood loss:Hgb 7.4 this AM, acute blood loss anemia  Pain management: 1. Norco 1 tab q 6 hours PRN 2. Robaxin 500 mg q 6 hours PRN 3. Morphine 0.5-1 mg q 2 hours 4. Tramadol 50 mg BID-will discontinue this AM  Will add toradol this AM 15 mg q 6 hours, will monitor Cr  VTE prophylaxis: Would restart home xarelto once hgb stablized. Recommend repeating CBC this PM and restarting if stable   ID: Clindamycin x 24 hrs  Foley/Lines: None  Medical co-morbidities: Diabetes-SSI A fib-on Xarelto  Impediments to Fracture Healing: Vit D deficiency will start vit D3 supplementation  Dispo: PT/OT evals, possible Home with son  Follow - up plan: 2 weeks   Shona Needles, MD [facility] [phone] (office) [email or web address]

## 2019-09-24 NOTE — Evaluation (Signed)
Occupational Therapy Evaluation Patient Details Name: Katie [name] MRN: [id number] DOB: 06/09/1932 Today's Date: 09/24/2019    History of Present Illness 86 yo female admitted to ED on 2/4 with fall, s/p IM nail L femur on 2/5. CT head and spine negative for acute changes. PMH includes atrial fibrillation and flutter on Xarelto, CKD, history of recent COVID-19 infection, NIDDM, hypertension, hyperlipidemia, anxiety, HF, and osteoporosis   Clinical Impression   Pt with decline in function and safety with ADLs and ADL mobility with impaired strength, balance, endurance and is limited by pain. Pt recently staying with her son since her covid infection December 2020, but prior to that pt lived at home alone and was independent with ADLs/selfcare, home mgt, was driving and had a cane for mobility. Pt currently requires max A +2 for be mobility to sit EOB, min A with UB ADLs, max - total A with LB ADLs, total A with toileting and mod - min A +2 for sit - stand/SPTs.  Pt with syncopal episode just as she was sitting down in recliner end of transfer. Nursing staff present to assess pt and shortly after their arrival pt began to regain consciousness and speaking to staff. Pt would benefit from acute OT services to address impairments to maximize level of function and safety    Follow Up Recommendations  SNF    Equipment Recommendations  Other (comment)(TBD at next venue of care)    Recommendations for Other Services       Precautions / Restrictions Precautions Precautions: Fall Restrictions Weight Bearing Restrictions: Yes LLE Weight Bearing: Weight bearing as tolerated      Mobility Bed Mobility Overal bed mobility: Needs Assistance Bed Mobility: Supine to Sit;Sit to Supine     Supine to sit: Max assist;+2 for safety/equipment;HOB elevated Sit to supine: Max assist;HOB elevated   General bed mobility comments: cues for sequencing and use of rail; assist to bring L LE and hips to EOB and  then to elevate trunk into sitting   Transfers Overall transfer level: Needs assistance Equipment used: Rolling walker (2 wheeled) Transfers: Sit to/from [facility] Sit to Stand: Min assist;+2 physical assistance Stand pivot transfers: Mod assist;+2 physical assistance       General transfer comment: cues for hand placement; assist to power up into standing and assistance for balance and managing RW for pivot to recliner     Balance Overall balance assessment: Needs assistance;History of Falls Sitting-balance support: Bilateral upper extremity supported;Feet supported;Single extremity supported Sitting balance-Leahy Scale: Fair Sitting balance - Comments: able to sit EOB without PT support, but must utilize UE support   Standing balance support: Bilateral upper extremity supported;During functional activity Standing balance-Leahy Scale: Poor                             ADL either performed or assessed with clinical judgement   ADL Overall ADL's : Needs assistance/impaired Eating/Feeding: Set up;Independent;Sitting;Bed level   Grooming: Wash/dry hands;Wash/dry face;Min guard;Sitting   Upper Body Bathing: Minimal assistance;Sitting   Lower Body Bathing: Maximal assistance   Upper Body Dressing : Minimal assistance;Sitting   Lower Body Dressing: Total assistance   Toilet Transfer: Minimal assistance;Moderate assistance;+2 for safety/equipment;Stand-pivot;Cueing for safety;Cueing for sequencing;RW Toilet Transfer Details (indicate cue type and reason): simlated to recliner Toileting- Clothing Manipulation and Hygiene: Total assistance;Sit to/from stand       Functional mobility during ADLs: Moderate assistance;Minimal assistance;+2 for physical assistance;Cueing for safety;Cueing for  sequencing;Rolling walker       Vision Patient Visual Report: No change from baseline       Perception     Praxis      Pertinent Vitals/Pain Pain  Assessment: Faces Faces Pain Scale: Hurts whole lot Pain Location: L LE  with mobility Pain Descriptors / Indicators: Grimacing;Guarding;Moaning;Sore Pain Intervention(s): Limited activity within patient's tolerance;Monitored during session;Premedicated before session;Repositioned;RN gave pain meds during session     Hand Dominance Right   Extremity/Trunk Assessment Upper Extremity Assessment Upper Extremity Assessment: Generalized weakness   Lower Extremity Assessment Lower Extremity Assessment: Defer to PT evaluation   Cervical / Trunk Assessment Cervical / Trunk Assessment: Normal   Communication Communication Communication: HOH   Cognition Arousal/Alertness: Awake/alert Behavior During Therapy: Anxious Overall Cognitive Status: Within Functional Limits for tasks assessed                                     General Comments       Exercises    Shoulder Instructions      Home Living Family/patient expects to be discharged to:: Private residence Living Arrangements: Alone Available Help at Discharge: Family;Available 24 hours/day Type of Home: House Home Access: Stairs to enter [employer] of Steps: 2   Home Layout: One level     Bathroom Shower/Tub: Walk-in shower;Tub/shower unit   Bathroom Toilet: Standard     Home Equipment: Cane - single point;Bedside commode          Prior Functioning/Environment Level of Independence: Needs assistance    ADL's / Homemaking Assistance Needed: was independent with ADLs/selfcare, home mgt and was driving prior to covid infection December 2020   Comments: pt reports driving, performing all ADLs for self. At time of fall, pt states she felt weaker than baseline due to recent covid infection in December.        OT Problem List: Decreased strength;Impaired balance (sitting and/or standing);Pain;Decreased activity tolerance;Decreased knowledge of use of DME or AE      OT  Treatment/Interventions: Self-care/ADL training;DME and/or AE instruction;Therapeutic activities;Balance training;Therapeutic exercise;Patient/family education    OT Goals(Current goals can be found in the care plan section) Acute Rehab OT Goals Patient Stated Goal: go home OT Goal Formulation: With patient Time For Goal Achievement: 10/08/19 Potential to Achieve Goals: Good ADL Goals Pt Will Perform Grooming: with supervision;with set-up;sitting Pt Will Perform Upper Body Bathing: with min guard assist;with supervision;with set-up;sitting Pt Will Perform Lower Body Bathing: with mod assist;sitting/lateral leans Pt Will Perform Upper Body Dressing: with min guard assist;with supervision;with set-up;sitting Pt Will Transfer to Toilet: with min assist;ambulating;stand pivot transfer;bedside commode  OT Frequency: Min 2X/week   Barriers to D/C:            Co-evaluation PT/OT/SLP Co-Evaluation/Treatment: Yes Reason for Co-Treatment: For patient/therapist safety;To address functional/ADL transfers PT goals addressed during session: Mobility/safety with mobility OT goals addressed during session: ADL's and self-care;Proper use of Adaptive equipment and DME      AM-PAC OT "6 Clicks" Daily Activity     Outcome Measure Help from another person eating meals?: None Help from another person taking care of personal grooming?: A Little Help from another person toileting, which includes using toliet, bedpan, or urinal?: Total Help from another person bathing (including washing, rinsing, drying)?: A Lot Help from another person to put on and taking off regular upper body clothing?: A Little Help from another person to put on and  taking off regular lower body clothing?: Total 6 Click Score: 14   End of Session Equipment Utilized During Treatment: Gait belt;Rolling walker Nurse Communication: Mobility status  Activity Tolerance: Other (comment);Patient limited by pain(anxiety, syncopal  episode) Patient left: in chair;with call bell/phone within reach;with chair alarm set;with nursing/sitter in room  OT Visit Diagnosis: Unsteadiness on feet (R26.81);Other abnormalities of gait and mobility (R26.89);Muscle weakness (generalized) (M62.81);History of falling (Z91.81);Pain Pain - Right/Left: Left Pain - part of body: Hip;Leg                Time: [id number] OT Time Calculation (min): 34 min Charges:  OT General Charges $OT Visit: 1 Visit OT Evaluation $OT Eval Moderate Complexity: 1 Mod    Britt Bottom 09/24/2019, 11:54 AM

## 2019-09-24 NOTE — Progress Notes (Addendum)
PROGRESS NOTE  Katie [name] [id number] DOB: August 26, 1931 DOA: 09/22/2019 PCP: [name], FNP  Brief summary:  Katie [name] is a 86 y.o. female with medical history significant of diet-controlled DM; hypothyroidism; HTN; HLD;  Stage 3 CKD; chronic diastolic CHF; and afib on Xarelto presenting with a fall.  On Thursday AM, she gets her hair done.  She went out to start her car to warm it up.  When she got out, she slipped on gravel and slid onto the ground.  She was unable to get up.  She had her phone and called her niece and they called 2.  She was very cold, lying on the ground.  She felt fine this AM prior to the fall.  Her last dose of Xarelto was about 5pm on 2/3.    She reports that she has had a very hard time since her husband died in 11/10/2018.  She had dysphagia and was admitted at [facility] from 12/16-19 with GERD; she was found to have COVID-19 infection and an E coli UTI.  She had an EGD with Dr. Carlean Purl on 1/26 and was found to have dysmotility/presbyesophagus and a Hill Grade III gastroesophageal flap.     HPI/Recap of past 24 hours:  POD#1 She is significantly orthostatic , almost passed out upon standing up She also vomited after breakfast, son reports this happens intermittently at home since she is diagnosed with esophageal motility issues   Son at bedside   Assessment/Plan: Principal Problem:   Hip fracture ([place]) Active Problems:   Hypertension   Hyperlipidemia   Diabetes mellitus type 2, diet-controlled (HCC)   GERD (gastroesophageal reflux disease)   Hypothyroidism   CKD (chronic kidney disease) stage 3, GFR 30-59 ml/min   Persistent atrial fibrillation (HCC)   Chronic diastolic CHF (congestive heart failure) (HCC)  Left hip fx --after mechanical fall xarelto held prior to surgery, ortho oked to resume if hgb stable on repeat this pm, resume xarelto when ok with ortho  IMN  with Dr. [name] on 2/5 Ortho recommended weightbearing as tolerated  to the left lower extremity Management per ortho  Orthostatic hypotension -she appear dehydrated, increase hydration    Macrocytic anemia Low b12, will start b12 supplement FOBT pending collection  PAF, h/o ablation of aflutter followed by Dr Rayann Heman chronic diastolic chf with preserved lvef currently sinus rhythm Denies chest pain, no sob, no edema She appear dehydrated, will increase ivf Close monitor volume status  Diet controlled DM2, h/o metformin intolerance  Fasting blood glucose 130 On ssi here F/u with pcp  HTN -bp low normal, decreased Catapres  From bid to qhs -Hold Lotensin for now  CKDIII Appear baseline  HLD -Continue Mevacor (Pravastatin formulary substitution)  GERD -Continue Protonix  H/o esophageal dysmotility: Son reports patient has been having trouble swallowing since she got sick from covid in 07/2019. Patient has lost 20lbs since then She is followed by gi Dr Carlean Purl She prefers diet changed to full liquid today, continue glucerna   Hypothyroidism -TSH 2.1 -Continue Synthroid at current dose for now   DVT Prophylaxis:resume xarelto when ok with ortho  Code Status: full  Family Communication: patient and son at bedside   Disposition Plan: SNF   Consultants:  ortho  Procedures:  IMN left hip on 2/5  Antibiotics:  Perioperative ancef on 2/5   Objective: BP (!) 107/57   Pulse 90   Temp 99 F (37.2 C) (Oral)   Resp 15   Ht 5\' 6"  (1.676  m)   Wt 72.6 kg   SpO2 94%   BMI 25.82 kg/m   Intake/Output Summary (Last 24 hours) at 09/24/2019 1106 Last data filed at 09/24/2019 0900 Gross per 24 hour  Intake 2923.23 ml  Output 450 ml  Net 2473.23 ml   Filed Weights   09/22/19 1022  Weight: 72.6 kg    Exam: Patient is examined daily including today on 09/24/2019, exams remain the same as of yesterday except that has changed    General:  NAD  Cardiovascular: RRR  Respiratory: CTABL  Abdomen: Soft/ND/NT, positive  BS  Musculoskeletal: left hip post op changes , no edema  Neuro: alert, oriented   Data Reviewed: Basic Metabolic Panel: Recent Labs  Lab 09/22/19 1035 09/23/19 0240 09/24/19 0731  NA 139 140 137  K 4.2 4.1 4.3  CL 103 100 102  CO2 24 27 26   GLUCOSE 176* 130* 118*  BUN 15 15 18   CREATININE 1.37* 1.45* 1.28*  CALCIUM 8.9 8.8* 8.5*   Liver Function Tests: Recent Labs  Lab 09/22/19 1035  AST 22  ALT 13  ALKPHOS 62  BILITOT 0.7  PROT 6.0*  ALBUMIN 3.5   No results for input(s): LIPASE, AMYLASE in the last 168 hours. No results for input(s): AMMONIA in the last 168 hours. CBC: Recent Labs  Lab 09/22/19 1035 09/23/19 0240 09/24/19 0731  WBC 7.8 7.0 9.4  NEUTROABS 5.6  --   --   HGB 11.4* 9.7* 7.4*  HCT 34.7* 29.2* 22.2*  MCV 101.5* 100.7* 99.1  PLT 159 155 134*   Cardiac Enzymes:   No results for input(s): CKTOTAL, CKMB, CKMBINDEX, TROPONINI in the last 168 hours. BNP (last 3 results) Recent Labs    01/28/19 1032  BNP 627.0*    ProBNP (last 3 results) No results for input(s): PROBNP in the last 8760 hours.  CBG: Recent Labs  Lab 09/23/19 0924 09/23/19 1224 09/23/19 1632 09/23/19 2035 09/24/19 0636  GLUCAP 127* 132* 192* 162* 129*    Recent Results (from the past 240 hour(s))  Respiratory Panel by RT PCR (Flu A&B, Covid) - Nasopharyngeal Swab     Status: None   Collection Time: 09/22/19 11:32 AM   Specimen: Nasopharyngeal Swab  Result Value Ref Range Status   SARS Coronavirus 2 by RT PCR NEGATIVE NEGATIVE Final    Comment: (NOTE) SARS-CoV-2 target nucleic acids are NOT DETECTED. The SARS-CoV-2 RNA is generally detectable in upper respiratoy specimens during the acute phase of infection. The lowest concentration of SARS-CoV-2 viral copies this assay can detect is 131 copies/mL. A negative result does not preclude SARS-Cov-2 infection and should not be used as the sole basis for treatment or other patient management decisions. A negative  result may occur with  improper specimen collection/handling, submission of specimen other than nasopharyngeal swab, presence of viral mutation(s) within the areas targeted by this assay, and inadequate number of viral copies (<131 copies/mL). A negative result must be combined with clinical observations, patient history, and epidemiological information. The expected result is Negative. Fact Sheet for Patients:  PinkCheek.be Fact Sheet for Healthcare Providers:  GravelBags.it This test is not yet ap proved or cleared by the [place] FDA and  has been authorized for detection and/or diagnosis of SARS-CoV-2 by FDA under an Emergency Use Authorization (EUA). This EUA will remain  in effect (meaning this test can be used) for the duration of the COVID-19 declaration under Section 564(b)(1) of the Act, 21 U.S.C. section 360bbb-3(b)(1), unless the authorization is  terminated or revoked sooner.    Influenza A by PCR NEGATIVE NEGATIVE Final   Influenza B by PCR NEGATIVE NEGATIVE Final    Comment: (NOTE) The Xpert Xpress SARS-CoV-2/FLU/RSV assay is intended as an aid in  the diagnosis of influenza from Nasopharyngeal swab specimens and  should not be used as a sole basis for treatment. Nasal washings and  aspirates are unacceptable for Xpert Xpress SARS-CoV-2/FLU/RSV  testing. Fact Sheet for Patients: PinkCheek.be Fact Sheet for Healthcare Providers: GravelBags.it This test is not yet approved or cleared by the [place] FDA and  has been authorized for detection and/or diagnosis of SARS-CoV-2 by  FDA under an Emergency Use Authorization (EUA). This EUA will remain  in effect (meaning this test can be used) for the duration of the  Covid-19 declaration under Section 564(b)(1) of the Act, 21  U.S.C. section 360bbb-3(b)(1), unless the authorization is  terminated or  revoked. Performed at [facility], [place] [address] [place], [place] [address]   Surgical pcr screen     Status: None   Collection Time: 09/22/19  5:32 PM   Specimen: Nasal Mucosa; Nasal Swab  Result Value Ref Range Status   MRSA, PCR NEGATIVE NEGATIVE Final   Staphylococcus aureus NEGATIVE NEGATIVE Final    Comment: (NOTE) The Xpert SA Assay (FDA approved for NASAL specimens in patients 79 years of age and older), is one component of a comprehensive surveillance program. It is not intended to diagnose infection nor to guide or monitor treatment. Performed at [facility], [place] [address], [place], [place] [address]      Studies: DG C-Arm 1-60 Min  Result Date: 09/23/2019 CLINICAL DATA:  Intertrochanteric left hip fracture EXAM: LEFT FEMUR 2 VIEWS; DG C-ARM 1-60 MIN COMPARISON:  09/22/2019 FINDINGS: Eight fluoroscopic images are obtained during the performance of the procedure and are provided for interpretation only. Comminuted intertrochanteric left hip fracture is identified. Placement of an intramedullary rod with proximal dynamic screws and distal interlocking screw. Alignment is near anatomic. FLUOROSCOPY TIME:  1 minutes 52 seconds IMPRESSION: ORIF left hip fracture. Electronically Signed   By: Randa Ngo M.D.   On: 09/23/2019 12:08   DG FEMUR MIN 2 VIEWS LEFT  Result Date: 09/23/2019 CLINICAL DATA:  Intertrochanteric left hip fracture EXAM: LEFT FEMUR 2 VIEWS; DG C-ARM 1-60 MIN COMPARISON:  09/22/2019 FINDINGS: Eight fluoroscopic images are obtained during the performance of the procedure and are provided for interpretation only. Comminuted intertrochanteric left hip fracture is identified. Placement of an intramedullary rod with proximal dynamic screws and distal interlocking screw. Alignment is near anatomic. FLUOROSCOPY TIME:  1 minutes 52 seconds IMPRESSION: ORIF left hip fracture. Electronically Signed   By: Randa Ngo M.D.   On: 09/23/2019 12:08   DG FEMUR  PORT MIN 2 VIEWS LEFT  Result Date: 09/23/2019 CLINICAL DATA:  ORIF left hip fracture EXAM: LEFT FEMUR PORTABLE 2 VIEWS COMPARISON:  09/22/2019 FINDINGS: Frontal and lateral views of the left femur are obtained. Intramedullary rod with proximal dynamic screws and a distal interlocking screw spans a comminuted proximal left femoral fracture. Alignment is anatomic. Postsurgical changes are seen in the soft tissues. IMPRESSION: 1. ORIF proximal left femoral fracture. Electronically Signed   By: Randa Ngo M.D.   On: 09/23/2019 13:23    Scheduled Meds: . cholecalciferol  2,000 Units Oral Daily  . clonazePAM  0.25 mg Oral QHS  . cloNIDine  0.1 mg Oral QHS  . docusate sodium  100 mg Oral  BID  . feeding supplement (GLUCERNA SHAKE)  237 mL Oral BID BM  . fluticasone  2 spray Each Nare Daily  . insulin aspart  0-15 Units Subcutaneous TID WC  . ketorolac  15 mg Intravenous Q6H  . levothyroxine  50 mcg Oral QAC breakfast  . multivitamin with minerals  1 tablet Oral Daily  . pantoprazole  40 mg Oral Daily  . pravastatin  40 mg Oral q1800  . senna-docusate  1 tablet Oral QHS    Continuous Infusions: . clindamycin (CLEOCIN) IV 900 mg (09/24/19 0513)  . lactated ringers 50 mL/hr at 09/24/19 0035  . lactated ringers 1,000 mL/hr at 09/23/19 1214  . methocarbamol (ROBAXIN) IV       Time spent: 68mins I have personally reviewed and interpreted on  09/24/2019 daily labs, imagings as discussed above under date review session and assessment and plans.  I reviewed all nursing notes, pharmacy notes, consultant notes,  vitals, pertinent old records  I have discussed plan of care as described above with RN , patient and family on 09/24/2019   Florencia Reasons MD, PhD, FACP  [facility]  Available via Epic secure chat 7am-7pm for nonurgent issues Please page for urgent issues, pager number available through [email or web address] .   09/24/2019, 11:06 AM  LOS: 2 days

## 2019-09-24 NOTE — Progress Notes (Addendum)
Physical Therapy Treatment Patient Details Name: Katie [name] MRN: [id number] DOB: 30-Apr-1932 Today's Date: 09/24/2019    History of Present Illness 86 yo female admitted to ED on 2/4 with fall, s/p IM nail L femur on 2/5. CT head and spine negative for acute changes. PMH includes atrial fibrillation and flutter on Xarelto, CKD, history of recent COVID-19 infection, NIDDM, hypertension, hyperlipidemia, anxiety, HF, and osteoporosis    PT Comments    Patient seen for mobility progression. Pt is making gradual progress toward PT goals and requires min/mod A +2 for functional transfer training this session. Pt with syncopal episode just as she was sitting down in recliner end of transfer. Nursing staff present to assess pt and shortly after their arrival pt began to regain consciousness and speaking to staff. Given pt's current mobility level and risk for falls recommend SNF for further skilled PT services to maximize independence and safety with mobility.     Follow Up Recommendations  Supervision/Assistance - 24 hour;SNF     Equipment Recommendations  Rolling walker with 5" wheels    Recommendations for Other Services       Precautions / Restrictions Precautions Precautions: Fall Restrictions Weight Bearing Restrictions: Yes LLE Weight Bearing: Weight bearing as tolerated    Mobility  Bed Mobility Overal bed mobility: Needs Assistance Bed Mobility: Supine to Sit;Sit to Supine     Supine to sit: Max assist;+2 for safety/equipment;HOB elevated     General bed mobility comments: cues for sequencing and use of rail; assist to bring L LE and hips to EOB and then to elevate trunk into sitting   Transfers Overall transfer level: Needs assistance Equipment used: Rolling walker (2 wheeled) Transfers: Sit to/from [facility] Sit to Stand: Min assist;+2 physical assistance Stand pivot transfers: Mod assist;+2 physical assistance       General transfer comment:  cues for hand placement; assist to power up into standing and assistance for balance and managing RW for pivot to recliner   Ambulation/Gait                 Stairs             Wheelchair Mobility    Modified Rankin (Stroke Patients Only)       Balance Overall balance assessment: Needs assistance;History of Falls Sitting-balance support: Bilateral upper extremity supported;Feet supported;Single extremity supported Sitting balance-Leahy Scale: Fair     Standing balance support: Bilateral upper extremity supported;During functional activity Standing balance-Leahy Scale: Poor                              Cognition Arousal/Alertness: Awake/alert Behavior During Therapy: Anxious Overall Cognitive Status: Within Functional Limits for tasks assessed                                        Exercises General Exercises - Lower Extremity Ankle Circles/Pumps: AROM;Both;Supine Heel Slides: AAROM;Left;5 reps;Limitations;Supine Heel Slides Limitations: limited ROM due to pain Hip ABduction/ADduction: AAROM;Left;5 reps;Supine;Limitations Hip Abduction/Adduction Limitations: limited ROM due to pain    General Comments        Pertinent Vitals/Pain Pain Assessment: Faces Faces Pain Scale: Hurts whole lot Pain Location: L hip with mobility Pain Descriptors / Indicators: Grimacing;Guarding;Moaning;Sore Pain Intervention(s): Limited activity within patient's tolerance;Monitored during session;Premedicated before session;Repositioned;RN gave pain meds during session    Home Living  Prior Function            PT Goals (current goals can now be found in the care plan section) Acute Rehab PT Goals Patient Stated Goal: go home Progress towards PT goals: Progressing toward goals    Frequency    Min 3X/week      PT Plan Discharge plan needs to be updated;Frequency needs to be updated    Co-evaluation  PT/OT/SLP Co-Evaluation/Treatment: Yes Reason for Co-Treatment: For patient/therapist safety;To address functional/ADL transfers PT goals addressed during session: Mobility/safety with mobility        AM-PAC PT "6 Clicks" Mobility   Outcome Measure  Help needed turning from your back to your side while in a flat bed without using bedrails?: A Lot Help needed moving from lying on your back to sitting on the side of a flat bed without using bedrails?: A Lot Help needed moving to and from a bed to a chair (including a wheelchair)?: A Lot Help needed standing up from a chair using your arms (e.g., wheelchair or bedside chair)?: A Lot Help needed to walk in hospital room?: Total Help needed climbing 3-5 steps with a railing? : Total 6 Click Score: 10    End of Session Equipment Utilized During Treatment: Gait belt Activity Tolerance: Patient limited by pain Patient left: with call bell/phone within reach;in chair;with nursing/sitter in room Nurse Communication: Mobility status PT Visit Diagnosis: Other abnormalities of gait and mobility (R26.89);Muscle weakness (generalized) (M62.81)     Time: [id number] PT Time Calculation (min) (ACUTE ONLY): 38 min  Charges:  $Gait Training: 23-37 mins                     Earney Navy, PTA Acute Rehabilitation Services Pager: [phone] Office: [phone]     Darliss Cheney 09/24/2019, 10:58 AM

## 2019-09-24 NOTE — Plan of Care (Signed)

## 2019-09-24 NOTE — Progress Notes (Signed)
RN called to room by PT, Pt had syncope episode. Vital signs taken and recorded, Pt alert and oriented x4, complained of nausea and vomiting, MD notified, new orders received. PRN zofran given with relief. RN attempted to get orthostatic vital signs but unable to get standing BP due to another syncope episode. Vital signs taken and recorded, assisted to bed, MD updated, received new orders. Pt now alert and oriented x4, son at bedside updated. Will continue to monitor.

## 2019-09-24 NOTE — Plan of Care (Signed)

## 2019-09-25 ENCOUNTER — Other Ambulatory Visit: Payer: Self-pay

## 2019-09-25 LAB — CBC
HCT: 19.5 % — ABNORMAL LOW (ref 36.0–46.0)
HCT: 23 % — ABNORMAL LOW (ref 36.0–46.0)
Hemoglobin: 6.6 g/dL — CL (ref 12.0–15.0)
Hemoglobin: 7.9 g/dL — ABNORMAL LOW (ref 12.0–15.0)
MCH: 33.3 pg (ref 26.0–34.0)
MCH: 32.8 pg (ref 26.0–34.0)
MCHC: 33.8 g/dL (ref 30.0–36.0)
MCHC: 34.3 g/dL (ref 30.0–36.0)
MCV: 98.5 fL (ref 80.0–100.0)
MCV: 95.4 fL (ref 80.0–100.0)
Platelets: 128 10*3/uL — ABNORMAL LOW (ref 150–400)
Platelets: 134 10*3/uL — ABNORMAL LOW (ref 150–400)
RBC: 1.98 MIL/uL — ABNORMAL LOW (ref 3.87–5.11)
RBC: 2.41 MIL/uL — ABNORMAL LOW (ref 3.87–5.11)
RDW: 13.2 % (ref 11.5–15.5)
RDW: 14.7 % (ref 11.5–15.5)
WBC: 6 10*3/uL (ref 4.0–10.5)
WBC: 5.9 10*3/uL (ref 4.0–10.5)
nRBC: 0 % (ref 0.0–0.2)
nRBC: 0 % (ref 0.0–0.2)

## 2019-09-25 LAB — BASIC METABOLIC PANEL
Anion gap: 10 (ref 5–15)
BUN: 19 mg/dL (ref 8–23)
CO2: 24 mmol/L (ref 22–32)
Calcium: 8.1 mg/dL — ABNORMAL LOW (ref 8.9–10.3)
Chloride: 102 mmol/L (ref 98–111)
Creatinine, Ser: 1.34 mg/dL — ABNORMAL HIGH (ref 0.44–1.00)
GFR calc Af Amer: 42 mL/min — ABNORMAL LOW (ref >60)
GFR calc non Af Amer: 36 mL/min — ABNORMAL LOW (ref >60)
Glucose, Bld: 99 mg/dL (ref 70–99)
Potassium: 4 mmol/L (ref 3.5–5.1)
Sodium: 136 mmol/L (ref 135–145)

## 2019-09-25 LAB — SARS CORONAVIRUS 2 (TAT 6-24 HRS): SARS Coronavirus 2: NEGATIVE

## 2019-09-25 LAB — PREPARE RBC (CROSSMATCH)

## 2019-09-25 LAB — GLUCOSE, CAPILLARY
Glucose-Capillary: 107 mg/dL — ABNORMAL HIGH (ref 70–99)
Glucose-Capillary: 148 mg/dL — ABNORMAL HIGH (ref 70–99)
Glucose-Capillary: 74 mg/dL (ref 70–99)

## 2019-09-25 LAB — CORTISOL: Cortisol, Plasma: 12.2 ug/dL

## 2019-09-25 MED ORDER — SODIUM CHLORIDE 0.9% IV SOLUTION: Freq: Once | INTRAVENOUS | Status: AC

## 2019-09-25 MED ORDER — FUROSEMIDE 10 MG/ML IJ SOLN
20.0000 mg | Freq: Once | INTRAMUSCULAR | Status: AC
  Administered 2019-09-25: 11:00:00 20 mg via INTRAVENOUS
  Filled 2019-09-25: qty 2

## 2019-09-25 MED ORDER — SODIUM CHLORIDE 0.9% IV SOLUTION: Freq: Once | INTRAVENOUS | Status: DC

## 2019-09-25 MED ORDER — BISACODYL 10 MG RE SUPP
10.0000 mg | Freq: Every day | RECTAL | Status: AC
  Administered 2019-09-25: 15:00:00 10 mg via RECTAL
  Filled 2019-09-25: qty 1

## 2019-09-25 NOTE — Plan of Care (Signed)
  Problem: Education: Goal: Knowledge of General Education information will improve Description: Including pain rating scale, medication(s)/side effects and non-pharmacologic comfort measures Outcome: Progressing   Problem: Pain Managment: Goal: General experience of comfort will improve Outcome: Progressing   

## 2019-09-25 NOTE — Progress Notes (Signed)
Subjective: 2 Days Post-Op Procedure(s) (LRB): INTRAMEDULLARY (IM) NAIL INTERTROCHANTRIC (Left) Patient reports pain as 3 on 0-10 scale.    Objective: Vital signs in last 24 hours: Temp:  [98.4 F (36.9 C)-99.2 F (37.3 C)] 98.4 F (36.9 C) (02/07 0643) Pulse Rate:  [74-102] 77 (02/07 0643) Resp:  [15-17] 17 (02/07 0643) BP: (107-153)/(40-97) 123/50 (02/07 0643) SpO2:  [93 %-100 %] 93 % (02/07 0643)  Intake/Output from previous day: 02/06 0701 - 02/07 0700 In: 2562.6 [P.O.:480; I.V.:1368.9; IV Piggyback:713.7] Out: 625 [Urine:625] Intake/Output this shift: No intake/output data recorded.  Recent Labs    09/22/19 1035 09/23/19 0240 09/24/19 0731 09/24/19 1549 09/25/19 0401  HGB 11.4* 9.7* 7.4* 7.9* 6.6*   Recent Labs    09/24/19 1549 09/25/19 0401  WBC 10.4 6.0  RBC 2.33* 1.98*  HCT 23.3* 19.5*  PLT 121* 128*   Recent Labs    09/24/19 0731 09/25/19 0401  NA 137 136  K 4.3 4.0  CL 102 102  CO2 26 24  BUN 18 19  CREATININE 1.28* 1.34*  GLUCOSE 118* 99  CALCIUM 8.5* 8.1*   Recent Labs    09/22/19 1035  INR 1.6*    ABD soft Neurovascular intact Sensation intact distally Incision: scant drainage   Assessment/Plan: 2 Days Post-Op Procedure(s) (LRB): INTRAMEDULLARY (IM) NAIL INTERTROCHANTRIC (Left) Advance diet  Patient is currently receiving blood for symptomatic post operative blood loss anemia.  It is ok to get her up to the chair or the bedside commode today if needed.  Her biggest complaint today is constipation.  The nurse will give her dulcolax off her prn orders.  May need SNF upon discharge for continued therapy   Linda Hedges 09/25/2019, 9:41 AM

## 2019-09-25 NOTE — Progress Notes (Signed)
PROGRESS NOTE  Katie [name] [id number] DOB: 20-Apr-1932 DOA: 09/22/2019 PCP: [name], FNP  Brief summary:  Katie [name] is a 86 y.o. female with medical history significant of diet-controlled DM; hypothyroidism; HTN; HLD;  Stage 3 CKD; chronic diastolic CHF; and afib on Xarelto presenting with a fall.  On Thursday AM, she gets her hair done.  She went out to start her car to warm it up.  When she got out, she slipped on gravel and slid onto the ground.  She was unable to get up.  She had her phone and called her niece and they called 77.  She was very cold, lying on the ground.  She felt fine this AM prior to the fall.  Her last dose of Xarelto was about 5pm on 2/3.    She reports that she has had a very hard time since her husband died in 10-27-2018.  She had dysphagia and was admitted at [facility] from 12/16-19 with GERD; she was found to have COVID-19 infection and an E coli UTI.  She had an EGD with Dr. Carlean Purl on 1/26 and was found to have dysmotility/presbyesophagus and a Hill Grade III gastroesophageal flap.     HPI/Recap of past 24 hours:  POD#2 hgb dropped to 6.6,  bp stable at rest Reports no bm since Tuesday  Continue to have intermittent post prandial vomiting which is chronic by report, denies ab pain, passing gas.   Assessment/Plan: Principal Problem:   Displaced intertrochanteric fracture of left femur, initial encounter for closed fracture (HCC) Active Problems:   Hypertension   Hyperlipidemia   Diabetes mellitus type 2, diet-controlled (HCC)   GERD (gastroesophageal reflux disease)   Hypothyroidism   CKD (chronic kidney disease) stage 3, GFR 30-59 ml/min   Persistent atrial fibrillation (HCC)   Chronic diastolic CHF (congestive heart failure) (HCC)  Left hip fx --after mechanical fall  IMN  with Dr. [name] on 2/5 xarelto continue on hold due to post op anemia needing blood transfusion Ortho recommended weightbearing as tolerated to the left  lower extremity Management per ortho  Orthostatic hypotension POD#1 when worked with therapy -am cortisol 12.2 -improved on hydration, continue for another 24hrs   Acute blood loss anemia Macrocytic anemia Low b12, started b12 supplement FOBT pending collection prbc x1unit this am, orth ordered another unit of prbc Continue to hold xarelto   thrombocytopnea plt 128 down from 155 consumption for acute bleed? monitor    PAF, h/o ablation of aflutter followed by Dr Rayann Heman chronic diastolic chf with preserved lvef currently sinus rhythm Denies chest pain, no sob, no edema She appear dehydrated, continue ivf for another day Close monitor volume status  Diet controlled DM2, h/o metformin intolerance  Fasting blood glucose 130 On ssi here F/u with pcp  HTN - decreased Catapres decreased  From bid to qhs lotensin held initially due to low normal bp -bp improved ,restart low dose Lotensin   CKDIII Appear baseline  HLD -Continue Mevacor (Pravastatin formulary substitution)  GERD -Continue Protonix  H/o esophageal dysmotility: Son reports patient has been having trouble swallowing since she got sick from covid in 07/2019. Patient has lost 20lbs since then She is followed by gi Dr Carlean Purl She prefers diet to be changed to full liquid, continue glucerna   Hypothyroidism -TSH 2.1 -Continue Synthroid at current dose for now   DVT Prophylaxis:resume xarelto when ok with ortho  Code Status: full  Family Communication: patient   Disposition Plan: SNF once hgb stable  Repeat covid test ordered on 2/7   Consultants:  ortho  Procedures:  IMN left hip on 2/5  prbc transfusion on 2/7  Antibiotics:  Perioperative ancef on 2/5   Objective: BP (!) 123/50   Pulse 77   Temp 98.4 F (36.9 C) (Oral)   Resp 17   Ht 5\' 6"  (1.676 m)   Wt 72.6 kg   SpO2 93%   BMI 25.82 kg/m   Intake/Output Summary (Last 24 hours) at 09/25/2019 0809 Last data filed at  09/25/2019 0620 Gross per 24 hour  Intake 2562.57 ml  Output 625 ml  Net 1937.57 ml   Filed Weights   09/22/19 1022  Weight: 72.6 kg    Exam: Patient is examined daily including today on 09/25/2019, exams remain the same as of yesterday except that has changed    General:  NAD, pleasant  Cardiovascular: RRR  Respiratory: CTABL  Abdomen: Soft/ND/NT, positive BS  Musculoskeletal: left hip post op changes , no edema  Neuro: alert, oriented   Data Reviewed: Basic Metabolic Panel: Recent Labs  Lab 09/22/19 1035 09/23/19 0240 09/24/19 0731 09/25/19 0401  NA 139 140 137 136  K 4.2 4.1 4.3 4.0  CL 103 100 102 102  CO2 24 27 26 24   GLUCOSE 176* 130* 118* 99  BUN 15 15 18 19   CREATININE 1.37* 1.45* 1.28* 1.34*  CALCIUM 8.9 8.8* 8.5* 8.1*   Liver Function Tests: Recent Labs  Lab 09/22/19 1035  AST 22  ALT 13  ALKPHOS 62  BILITOT 0.7  PROT 6.0*  ALBUMIN 3.5   No results for input(s): LIPASE, AMYLASE in the last 168 hours. No results for input(s): AMMONIA in the last 168 hours. CBC: Recent Labs  Lab 09/22/19 1035 09/23/19 0240 09/24/19 0731 09/24/19 1549 09/25/19 0401  WBC 7.8 7.0 9.4 10.4 6.0  NEUTROABS 5.6  --   --   --   --   HGB 11.4* 9.7* 7.4* 7.9* 6.6*  HCT 34.7* 29.2* 22.2* 23.3* 19.5*  MCV 101.5* 100.7* 99.1 100.0 98.5  PLT 159 155 134* 121* 128*   Cardiac Enzymes:   No results for input(s): CKTOTAL, CKMB, CKMBINDEX, TROPONINI in the last 168 hours. BNP (last 3 results) Recent Labs    01/28/19 1032  BNP 627.0*    ProBNP (last 3 results) No results for input(s): PROBNP in the last 8760 hours.  CBG: Recent Labs  Lab 09/24/19 0636 09/24/19 1124 09/24/19 1624 09/24/19 2007 09/25/19 0641  GLUCAP 129* 116* 120* 140* 107*    Recent Results (from the past 240 hour(s))  Respiratory Panel by RT PCR (Flu A&B, Covid) - Nasopharyngeal Swab     Status: None   Collection Time: 09/22/19 11:32 AM   Specimen: Nasopharyngeal Swab  Result Value  Ref Range Status   SARS Coronavirus 2 by RT PCR NEGATIVE NEGATIVE Final    Comment: (NOTE) SARS-CoV-2 target nucleic acids are NOT DETECTED. The SARS-CoV-2 RNA is generally detectable in upper respiratoy specimens during the acute phase of infection. The lowest concentration of SARS-CoV-2 viral copies this assay can detect is 131 copies/mL. A negative result does not preclude SARS-Cov-2 infection and should not be used as the sole basis for treatment or other patient management decisions. A negative result may occur with  improper specimen collection/handling, submission of specimen other than nasopharyngeal swab, presence of viral mutation(s) within the areas targeted by this assay, and inadequate number of viral copies (<131 copies/mL). A negative result must be combined with clinical observations,  patient history, and epidemiological information. The expected result is Negative. Fact Sheet for Patients:  PinkCheek.be Fact Sheet for Healthcare Providers:  GravelBags.it This test is not yet ap proved or cleared by the [place] FDA and  has been authorized for detection and/or diagnosis of SARS-CoV-2 by FDA under an Emergency Use Authorization (EUA). This EUA will remain  in effect (meaning this test can be used) for the duration of the COVID-19 declaration under Section 564(b)(1) of the Act, 21 U.S.C. section 360bbb-3(b)(1), unless the authorization is terminated or revoked sooner.    Influenza A by PCR NEGATIVE NEGATIVE Final   Influenza B by PCR NEGATIVE NEGATIVE Final    Comment: (NOTE) The Xpert Xpress SARS-CoV-2/FLU/RSV assay is intended as an aid in  the diagnosis of influenza from Nasopharyngeal swab specimens and  should not be used as a sole basis for treatment. Nasal washings and  aspirates are unacceptable for Xpert Xpress SARS-CoV-2/FLU/RSV  testing. Fact Sheet for  Patients: PinkCheek.be Fact Sheet for Healthcare Providers: GravelBags.it This test is not yet approved or cleared by the [place] FDA and  has been authorized for detection and/or diagnosis of SARS-CoV-2 by  FDA under an Emergency Use Authorization (EUA). This EUA will remain  in effect (meaning this test can be used) for the duration of the  Covid-19 declaration under Section 564(b)(1) of the Act, 21  U.S.C. section 360bbb-3(b)(1), unless the authorization is  terminated or revoked. Performed at [facility], [place] [address] [place], [place] [address]   Surgical pcr screen     Status: None   Collection Time: 09/22/19  5:32 PM   Specimen: Nasal Mucosa; Nasal Swab  Result Value Ref Range Status   MRSA, PCR NEGATIVE NEGATIVE Final   Staphylococcus aureus NEGATIVE NEGATIVE Final    Comment: (NOTE) The Xpert SA Assay (FDA approved for NASAL specimens in patients 3 years of age and older), is one component of a comprehensive surveillance program. It is not intended to diagnose infection nor to guide or monitor treatment. Performed at [facility], [place] [address] [place], [place] [address]   Culture, Urine     Status: Abnormal   Collection Time: 09/23/19  5:27 PM   Specimen: Urine, Random  Result Value Ref Range Status   Specimen Description URINE, RANDOM  Final   Special Requests   Final    NONE Performed at [facility], [place] [address], [place],  [address]    Culture MULTIPLE SPECIES PRESENT, SUGGEST RECOLLECTION (A)  Final   Report Status 09/24/2019 FINAL  Final     Studies: No results found.  Scheduled Meds: . sodium chloride   Intravenous Once  . benazepril  5 mg Oral Daily  . cholecalciferol  2,000 Units Oral Daily  . clonazePAM  0.25 mg Oral QHS  . cloNIDine  0.1 mg Oral QHS  . docusate sodium  100 mg Oral BID  . feeding supplement (GLUCERNA SHAKE)  237 mL Oral TID BM  .  fluticasone  2 spray Each Nare Daily  . insulin aspart  0-15 Units Subcutaneous TID WC  . ketorolac  15 mg Intravenous Q6H  . levothyroxine  50 mcg Oral QAC breakfast  . multivitamin with minerals  1 tablet Oral Daily  . pantoprazole  40 mg Oral Daily  . pravastatin  40 mg Oral q1800  . senna-docusate  1 tablet Oral QHS  . vitamin B-12  1,000 mcg Oral Daily    Continuous Infusions: .  lactated ringers 75 mL/hr at 09/24/19 1704  . methocarbamol (ROBAXIN) IV       Time spent: 25mins I have personally reviewed and interpreted on  09/25/2019 daily labs, imagings as discussed above under date review session and assessment and plans.  I reviewed all nursing notes, pharmacy notes, consultant notes,  vitals, pertinent old records  I have discussed plan of care as described above with RN , patient  on 09/25/2019   Florencia Reasons MD, PhD, FACP  [facility]  Available via Epic secure chat 7am-7pm for nonurgent issues Please page for urgent issues, pager number available through [email or web address] .   09/25/2019, 8:09 AM  LOS: 3 days

## 2019-09-25 NOTE — Progress Notes (Signed)
Completed 1 unit of PRBCs without any difficulties.

## 2019-09-26 ENCOUNTER — Encounter: Payer: Self-pay | Admitting: *Deleted

## 2019-09-26 DIAGNOSIS — S72142A Displaced intertrochanteric fracture of left femur, initial encounter for closed fracture: Principal | ICD-10-CM

## 2019-09-26 LAB — BPAM RBC
Blood Product Expiration Date: 202102142359
Blood Product Expiration Date: 202103092359
Blood Product Expiration Date: 202103092359
ISSUE DATE / TIME: 202102070619
Unit Type and Rh: 5100
Unit Type and Rh: 5100
Unit Type and Rh: 5100

## 2019-09-26 LAB — COMPREHENSIVE METABOLIC PANEL
ALT: 14 U/L (ref 0–44)
AST: 16 U/L (ref 15–41)
Albumin: 2.2 g/dL — ABNORMAL LOW (ref 3.5–5.0)
Alkaline Phosphatase: 45 U/L (ref 38–126)
Anion gap: 7 (ref 5–15)
BUN: 15 mg/dL (ref 8–23)
CO2: 27 mmol/L (ref 22–32)
Calcium: 8.1 mg/dL — ABNORMAL LOW (ref 8.9–10.3)
Chloride: 105 mmol/L (ref 98–111)
Creatinine, Ser: 1.14 mg/dL — ABNORMAL HIGH (ref 0.44–1.00)
GFR calc Af Amer: 51 mL/min — ABNORMAL LOW (ref >60)
GFR calc non Af Amer: 44 mL/min — ABNORMAL LOW (ref >60)
Glucose, Bld: 125 mg/dL — ABNORMAL HIGH (ref 70–99)
Potassium: 4.1 mmol/L (ref 3.5–5.1)
Sodium: 139 mmol/L (ref 135–145)
Total Bilirubin: 0.7 mg/dL (ref 0.3–1.2)
Total Protein: 4.3 g/dL — ABNORMAL LOW (ref 6.5–8.1)

## 2019-09-26 LAB — TYPE AND SCREEN
ABO/RH(D): O POS
Antibody Screen: NEGATIVE
Unit division: 0
Unit division: 0
Unit division: 0

## 2019-09-26 LAB — GLUCOSE, CAPILLARY
Glucose-Capillary: 105 mg/dL — ABNORMAL HIGH (ref 70–99)
Glucose-Capillary: 140 mg/dL — ABNORMAL HIGH (ref 70–99)
Glucose-Capillary: 138 mg/dL — ABNORMAL HIGH (ref 70–99)
Glucose-Capillary: 103 mg/dL — ABNORMAL HIGH (ref 70–99)

## 2019-09-26 LAB — CBC
HCT: 23.1 % — ABNORMAL LOW (ref 36.0–46.0)
Hemoglobin: 7.6 g/dL — ABNORMAL LOW (ref 12.0–15.0)
MCH: 32.2 pg (ref 26.0–34.0)
MCHC: 32.9 g/dL (ref 30.0–36.0)
MCV: 97.9 fL (ref 80.0–100.0)
Platelets: 146 10*3/uL — ABNORMAL LOW (ref 150–400)
RBC: 2.36 MIL/uL — ABNORMAL LOW (ref 3.87–5.11)
RDW: 15 % (ref 11.5–15.5)
WBC: 6 10*3/uL (ref 4.0–10.5)
nRBC: 0 % (ref 0.0–0.2)

## 2019-09-26 LAB — OCCULT BLOOD X 1 CARD TO LAB, STOOL: Fecal Occult Bld: NEGATIVE

## 2019-09-26 NOTE — Progress Notes (Signed)
PROGRESS NOTE    Katie [name]  WUJ:[id number] DOB: 10/04/1931 DOA: 09/22/2019 PCP: [name], FNP   Brief Narrative:  Katie [name] a 86 y.o.femalewith medical history significant ofdiet-controlled DM; hypothyroidism; HTN; HLD; Stage 3 CKD; chronic diastolic CHF; and afib on Xareltopresenting with a fall.On Thursday AM, she gets her hair done. She went out to start her car to warm it up. When she got out, she slipped on gravel and slid onto the ground. She was unable to get up. She had her phone and called her niece and they called 70. She was very cold, lying on the ground. She felt fine this AM prior to the fall. Her last dose of Xarelto was about 5pm on 2/3.   She reports that she has had a very hard time since her husband died in 2018/10/28. She had dysphagia and was admitted at [facility] from 12/16-19 with GERD; she was found to have COVID-19 infection and an E coli UTI. She had an EGD with Dr. Carlean Purl on 1/26 and was found to have dysmotility/presbyesophagus and a Hill Grade III gastroesophageal flap.   Assessment & Plan:   Principal Problem:   Displaced intertrochanteric fracture of left femur, initial encounter for closed fracture (HCC) Active Problems:   Hypertension   Hyperlipidemia   Diabetes mellitus type 2, diet-controlled (HCC)   GERD (gastroesophageal reflux disease)   Hypothyroidism   CKD (chronic kidney disease) stage 3, GFR 30-59 ml/min   Persistent atrial fibrillation (HCC)   Chronic diastolic CHF (congestive heart failure) (HCC)  Left hip fx--after mechanical fall  IMN  with Dr. [name] on 2/5 xarelto continue on hold due to post op anemia needing blood transfusion Ortho recommended weightbearing as tolerated to the left lower extremity Management per ortho -once stable SNF for rehab  Orthostatic hypotension POD#1 when worked with therapy -am cortisol 12.2  Acute blood loss anemia Macrocytic anemia Low b12, started b12  supplement FOBT pending collection prbc x1unit 09/25/19 Continue to hold xarelto, if hg stable 09/27/19 will resume, no clinical signs of bleeding  thrombocytopnea stabilized monitor  PAF, h/o ablation of aflutter followed by Dr Rayann Heman chronic diastolic chf with preserved lvef currently sinus rhythm Denies chest pain, no sob, no edema Close monitor volume status  Diet controlled DM2, h/o metformin intolerance  Fasting blood glucose 130 On ssi here F/u with pcp  HTN - decreased Catapres decreased  from bid to qhs lotensin resumed  CKDIII Appear baseline  HLD -Continue Mevacor (Pravastatin formulary substitution)  GERD -Continue Protonix  H/o esophageal dysmotility: Son reports patient has been having trouble swallowing since she got sick from covid in 07/2019. Patient has lost 20lbs since then She is followed by gi Dr Carlean Purl She prefers diet to be changed to full liquid, continue glucerna  Hypothyroidism -TSH 2.1 -Continue Synthroid at current dose for now  DVT prophylaxis: SCDs, if Hg stable tomorrow resume xarelto Code Status: full Family Communication: patient only Disposition Plan:  . Patient came from:home            . Anticipated d/c place: SNF . Barriers to d/c OR conditions which need to be met to effect a safe d/c: needs SNF once Hg stable hopefully 09/27/19 or 09/28/19   Consultants:   Orthopedics  Procedures:  Left hip IMN 09/23/19   Antimicrobials:   Peri-operative only   Subjective: Feeling okay, hurting a lot with hip. Is frustrated overall with her health as she just recovered from Covid in December and still feels  very weak. Denies chest pains or SOB. Denies diarrhea  Objective: Vitals:   09/26/19 0800 09/26/19 1000 09/26/19 1009 09/26/19 1145  BP: (!) 160/70 (!) 147/58 (!) 138/57 (!) 141/64  Pulse: 70 91 62 73  Resp: 16   16  Temp: 98.2 F (36.8 C)   98.6 F (37 C)  TempSrc: Oral   Oral  SpO2: 99%   96%  Weight:       Height:        Intake/Output Summary (Last 24 hours) at 09/26/2019 1200 Last data filed at 09/26/2019 1100 Gross per 24 hour  Intake 720 ml  Output 500 ml  Net 220 ml   Filed Weights   09/22/19 1022  Weight: 72.6 kg    Examination:  General exam: Appears calm and in pain from hip Respiratory system: Clear to auscultation. Respiratory effort normal. Cardiovascular system: S1 & S2 heard, RRR. No JVD, murmurs, rubs, gallops or clicks. No pedal edema. Gastrointestinal system: Abdomen is nondistended, soft and nontender. No organomegaly or masses felt. Normal bowel sounds heard. Central nervous system: Alert and oriented. No focal neurological deficits. Extremities: hip left in pain will not move leg Skin: No rashes, lesions or ulcers Psychiatry: Judgement and insight appear normal. Mood & affect appropriate.   Data Reviewed: I have personally reviewed following labs and imaging studies  CBC: Recent Labs  Lab 09/22/19 1035 09/23/19 0240 09/24/19 0731 09/24/19 1549 09/25/19 0401 09/25/19 1548 09/26/19 0443  WBC 7.8   < > 9.4 10.4 6.0 5.9 6.0  NEUTROABS 5.6  --   --   --   --   --   --   HGB 11.4*   < > 7.4* 7.9* 6.6* 7.9* 7.6*  HCT 34.7*   < > 22.2* 23.3* 19.5* 23.0* 23.1*  MCV 101.5*   < > 99.1 100.0 98.5 95.4 97.9  PLT 159   < > 134* 121* 128* 134* 146*   < > = values in this interval not displayed.   Basic Metabolic Panel: Recent Labs  Lab 09/22/19 1035 09/23/19 0240 09/24/19 0731 09/25/19 0401 09/26/19 0443  NA 139 140 137 136 139  K 4.2 4.1 4.3 4.0 4.1  CL 103 100 102 102 105  CO2 '24 27 26 24 27  ' GLUCOSE 176* 130* 118* 99 125*  BUN '15 15 18 19 15  ' CREATININE 1.37* 1.45* 1.28* 1.34* 1.14*  CALCIUM 8.9 8.8* 8.5* 8.1* 8.1*   GFR: Estimated Creatinine Clearance: 36.8 mL/min (A) (by C-G formula based on SCr of 1.14 mg/dL (H)). Liver Function Tests: Recent Labs  Lab 09/22/19 1035 09/26/19 0443  AST 22 16  ALT 13 14  ALKPHOS 62 45  BILITOT 0.7 0.7  PROT  6.0* 4.3*  ALBUMIN 3.5 2.2*   No results for input(s): LIPASE, AMYLASE in the last 168 hours. No results for input(s): AMMONIA in the last 168 hours. Coagulation Profile: Recent Labs  Lab 09/22/19 1035  INR 1.6*   Cardiac Enzymes: No results for input(s): CKTOTAL, CKMB, CKMBINDEX, TROPONINI in the last 168 hours. BNP (last 3 results) No results for input(s): PROBNP in the last 8760 hours. HbA1C: No results for input(s): HGBA1C in the last 72 hours. CBG: Recent Labs  Lab 09/25/19 0641 09/25/19 1144 09/25/19 1626 09/26/19 0802 09/26/19 1138  GLUCAP 107* 148* 74 105* 140*   Lipid Profile: No results for input(s): CHOL, HDL, LDLCALC, TRIG, CHOLHDL, LDLDIRECT in the last 72 hours. Thyroid Function Tests: Recent Labs    09/24/19 0731  TSH 2.162   Anemia Panel: Recent Labs    09/24/19 0731  VITAMINB12 54*  FOLATE 9.3  FERRITIN 131  TIBC 235*  IRON 27*   Sepsis Labs: No results for input(s): PROCALCITON, LATICACIDVEN in the last 168 hours.  Recent Results (from the past 240 hour(s))  Respiratory Panel by RT PCR (Flu A&B, Covid) - Nasopharyngeal Swab     Status: None   Collection Time: 09/22/19 11:32 AM   Specimen: Nasopharyngeal Swab  Result Value Ref Range Status   SARS Coronavirus 2 by RT PCR NEGATIVE NEGATIVE Final    Comment: (NOTE) SARS-CoV-2 target nucleic acids are NOT DETECTED. The SARS-CoV-2 RNA is generally detectable in upper respiratoy specimens during the acute phase of infection. The lowest concentration of SARS-CoV-2 viral copies this assay can detect is 131 copies/mL. A negative result does not preclude SARS-Cov-2 infection and should not be used as the sole basis for treatment or other patient management decisions. A negative result may occur with  improper specimen collection/handling, submission of specimen other than nasopharyngeal swab, presence of viral mutation(s) within the areas targeted by this assay, and inadequate number of viral  copies (<131 copies/mL). A negative result must be combined with clinical observations, patient history, and epidemiological information. The expected result is Negative. Fact Sheet for Patients:  PinkCheek.be Fact Sheet for Healthcare Providers:  GravelBags.it This test is not yet ap proved or cleared by the [place] FDA and  has been authorized for detection and/or diagnosis of SARS-CoV-2 by FDA under an Emergency Use Authorization (EUA). This EUA will remain  in effect (meaning this test can be used) for the duration of the COVID-19 declaration under Section 564(b)(1) of the Act, 21 U.S.C. section 360bbb-3(b)(1), unless the authorization is terminated or revoked sooner.    Influenza A by PCR NEGATIVE NEGATIVE Final   Influenza B by PCR NEGATIVE NEGATIVE Final    Comment: (NOTE) The Xpert Xpress SARS-CoV-2/FLU/RSV assay is intended as an aid in  the diagnosis of influenza from Nasopharyngeal swab specimens and  should not be used as a sole basis for treatment. Nasal washings and  aspirates are unacceptable for Xpert Xpress SARS-CoV-2/FLU/RSV  testing. Fact Sheet for Patients: PinkCheek.be Fact Sheet for Healthcare Providers: GravelBags.it This test is not yet approved or cleared by the [place] FDA and  has been authorized for detection and/or diagnosis of SARS-CoV-2 by  FDA under an Emergency Use Authorization (EUA). This EUA will remain  in effect (meaning this test can be used) for the duration of the  Covid-19 declaration under Section 564(b)(1) of the Act, 21  U.S.C. section 360bbb-3(b)(1), unless the authorization is  terminated or revoked. Performed at [facility], [place] [address] [place], [place] [address]   Surgical pcr screen     Status: None   Collection Time: 09/22/19  5:32 PM   Specimen: Nasal Mucosa; Nasal Swab  Result Value Ref  Range Status   MRSA, PCR NEGATIVE NEGATIVE Final   Staphylococcus aureus NEGATIVE NEGATIVE Final    Comment: (NOTE) The Xpert SA Assay (FDA approved for NASAL specimens in patients 59 years of age and older), is one component of a comprehensive surveillance program. It is not intended to diagnose infection nor to guide or monitor treatment. Performed at [facility], [place] [address], [place], [place] [address]   Culture, Urine     Status: Abnormal   Collection Time: 09/23/19  5:27 PM   Specimen: Urine, Random  Result Value Ref  Range Status   Specimen Description URINE, RANDOM  Final   Special Requests   Final    NONE Performed at [facility], [address] [place], [place] [address]    Culture MULTIPLE SPECIES PRESENT, SUGGEST RECOLLECTION (A)  Final   Report Status 09/24/2019 FINAL  Final  SARS CORONAVIRUS 2 (TAT 6-24 HRS) Nasopharyngeal Nasopharyngeal Swab     Status: None   Collection Time: 09/25/19  7:14 PM   Specimen: Nasopharyngeal Swab  Result Value Ref Range Status   SARS Coronavirus 2 NEGATIVE NEGATIVE Final    Comment: (NOTE) SARS-CoV-2 target nucleic acids are NOT DETECTED. The SARS-CoV-2 RNA is generally detectable in upper and lower respiratory specimens during the acute phase of infection. Negative results do not preclude SARS-CoV-2 infection, do not rule out co-infections with other pathogens, and should not be used as the sole basis for treatment or other patient management decisions. Negative results must be combined with clinical observations, patient history, and epidemiological information. The expected result is Negative. Fact Sheet for Patients: SugarRoll.be Fact Sheet for Healthcare Providers: https://www.woods-mathews.com/ This test is not yet approved or cleared by the [place] FDA and  has been authorized for detection and/or diagnosis of SARS-CoV-2 by FDA under an Emergency Use  Authorization (EUA). This EUA will remain  in effect (meaning this test can be used) for the duration of the COVID-19 declaration under Section 56 4(b)(1) of the Act, 21 U.S.C. section 360bbb-3(b)(1), unless the authorization is terminated or revoked sooner. Performed at [facility], [place] [address] [place], [place] [address]     Radiology Studies: No results found.  Scheduled Meds: . sodium chloride   Intravenous Once  . benazepril  5 mg Oral Daily  . bisacodyl  10 mg Rectal Q1500  . cholecalciferol  2,000 Units Oral Daily  . clonazePAM  0.25 mg Oral QHS  . cloNIDine  0.1 mg Oral QHS  . docusate sodium  100 mg Oral BID  . feeding supplement (GLUCERNA SHAKE)  237 mL Oral TID BM  . fluticasone  2 spray Each Nare Daily  . insulin aspart  0-15 Units Subcutaneous TID WC  . levothyroxine  50 mcg Oral QAC breakfast  . multivitamin with minerals  1 tablet Oral Daily  . pantoprazole  40 mg Oral Daily  . pravastatin  40 mg Oral q1800  . senna-docusate  1 tablet Oral QHS  . vitamin B-12  1,000 mcg Oral Daily   Continuous Infusions: . methocarbamol (ROBAXIN) IV      LOS: 4 days   Time spent: 25  Hoyt Koch, MD [facility]  To contact the attending provider between 7A-7P or the covering provider during after hours 7P-7A, please log into the web site [email or web address] and access using universal [place] password for that web site. If you do not have the password, please call the hospital operator.  09/26/2019, 12:00 PM

## 2019-09-26 NOTE — Progress Notes (Signed)
Physical Therapy Treatment Patient Details Name: Katie [name] MRN: [id number] DOB: 10-03-1931 Today's Date: 09/26/2019    History of Present Illness 86 yo female admitted to ED on 2/4 with fall, s/p IM nail L femur on 2/5. CT head and spine negative for acute changes. PMH includes atrial fibrillation and flutter on Xarelto, CKD, history of recent COVID-19 infection, NIDDM, hypertension, hyperlipidemia, anxiety, HF, and osteoporosis    PT Comments    Pt willing to mobilize and reports no pain at rest with 6/10 pain left thigh with mobility. Pt very anxious throughout session fearful of pain and syncope. Pt continues to be limited by hypotension with mobility and would benefit from TED hose for mobility. At this time utilized SCDs during transfer to improved tolerance and BP. RN present during session and aware of all vitals. Pt encouraged to perform HEP throughout the day.  BP supine 160/72 EOB 123/64 Standing 77/44 (56) After returning to sitting EOB 79/43 (53) EOB with SCDs 93/49 (63), HR 93 With pivot to chair with SCDs maintained 75/43 (54) In chair after HEP end of session 79/58 (66), HR 82    Follow Up Recommendations  Supervision/Assistance - 24 hour;SNF     Equipment Recommendations  Rolling walker with 5" wheels    Recommendations for Other Services       Precautions / Restrictions Precautions Precautions: Fall Precaution Comments: watch BP Restrictions Weight Bearing Restrictions: Yes LLE Weight Bearing: Weight bearing as tolerated    Mobility  Bed Mobility Overal bed mobility: Needs Assistance Bed Mobility: Supine to Sit     Supine to sit: Mod assist;HOB elevated     General bed mobility comments: physical assist to help pivot pelvis to EOB, lift trunk and move LLE to EOB  Transfers Overall transfer level: Needs assistance   Transfers: Sit to/from Stand;Stand Pivot Transfers Sit to Stand: Min assist;+2 physical assistance Stand pivot transfers: Min  assist;+2 safety/equipment       General transfer comment: cues for hand placement; assist to power up into standing and assistance for balance and managing RW for pivot to recliner. BP drop with transfers and increased BP with sitting, bil LE HEP and use of SCD maintained to pivot to chair  Ambulation/Gait             General Gait Details: unable due to hypotension   Stairs             Wheelchair Mobility    Modified Rankin (Stroke Patients Only)       Balance Overall balance assessment: Needs assistance Sitting-balance support: Bilateral upper extremity supported;Feet supported;Single extremity supported Sitting balance-Leahy Scale: Fair Sitting balance - Comments: able to sit EOB without PT support, but must utilize UE support   Standing balance support: Bilateral upper extremity supported;During functional activity Standing balance-Leahy Scale: Poor Standing balance comment: bil UE support in standing                            Cognition Arousal/Alertness: Awake/alert Behavior During Therapy: Anxious Overall Cognitive Status: Within Functional Limits for tasks assessed                                 General Comments: pt very anxious regarding pain and syncope. Pt benefits from cues for breathing technique, calming and reassurance      Exercises General Exercises - Lower Extremity Long Arc Quad: AROM;AAROM;Right;Left;Seated;10  reps(AAROM LLE) Hip Flexion/Marching: AROM;AAROM;Right;Left;Seated;10 reps(AAROM LLE)    General Comments        Pertinent Vitals/Pain Pain Score: 6  Pain Location: L LE  with mobility Pain Descriptors / Indicators: Grimacing;Guarding;Moaning;Sore Pain Intervention(s): Limited activity within patient's tolerance;Monitored during session;Premedicated before session;Repositioned    Home Living                      Prior Function            PT Goals (current goals can now be found in the  care plan section) Progress towards PT goals: Progressing toward goals    Frequency    Min 3X/week      PT Plan Current plan remains appropriate    Co-evaluation              AM-PAC PT "6 Clicks" Mobility   Outcome Measure  Help needed turning from your back to your side while in a flat bed without using bedrails?: A Lot Help needed moving from lying on your back to sitting on the side of a flat bed without using bedrails?: A Lot Help needed moving to and from a bed to a chair (including a wheelchair)?: A Lot Help needed standing up from a chair using your arms (e.g., wheelchair or bedside chair)?: A Lot Help needed to walk in hospital room?: Total Help needed climbing 3-5 steps with a railing? : Total 6 Click Score: 10    End of Session Equipment Utilized During Treatment: Gait belt Activity Tolerance: Patient limited by pain Patient left: with call bell/phone within reach;in chair;with nursing/sitter in room;with chair alarm set Nurse Communication: Mobility status;Precautions;Weight bearing status PT Visit Diagnosis: Other abnormalities of gait and mobility (R26.89);Muscle weakness (generalized) (M62.81)     Time: [id number] PT Time Calculation (min) (ACUTE ONLY): 36 min  Charges:  $Therapeutic Exercise: 8-22 mins $Therapeutic Activity: 8-22 mins                     Samary Shatz P, PT Acute Rehabilitation Services Pager: [phone] Office: [place] 09/26/2019, 1:11 PM

## 2019-09-26 NOTE — Plan of Care (Signed)

## 2019-09-26 NOTE — NC FL2 (Signed)
[place] LEVEL OF CARE SCREENING TOOL     IDENTIFICATION  Patient Name: Katie [name] Birthdate: Aug 24, 1931 Sex: female Admission Date (Current Location): 09/22/2019  [facility] and [place] Number:  Herbalist and Address:  The . [facility], [place] [address], [place], [place] [address]      Provider Number: [id number]  Attending Physician Name and Address:  Hoyt Koch, MD  Relative Name and Phone Number:       Current Level of Care: Hospital Recommended Level of Care: [place] Prior Approval Number:    Date Approved/Denied:   PASRR Number: [id number]  Discharge Plan: SNF    Current Diagnoses: Patient Active Problem List   Diagnosis Date Noted  . Displaced intertrochanteric fracture of left femur, initial encounter for closed fracture ([place]) 09/22/2019  . Regurgitation of food 07/20/2019  . Chronic midline low back pain without sciatica 10/22/2018  . Primary insomnia 10/22/2018  . Persistent atrial fibrillation ([place]) 04/27/2017  . Chronic diastolic CHF (congestive heart failure) ([place]) 04/27/2017  . CKD (chronic kidney disease) stage 3, GFR 30-59 ml/min 05/14/2015  . BMI 26.0-26.9,adult 05/11/2015  . Peripheral edema 07/06/2014  . Hypokalemia 07/22/2013  . Hypothyroidism 07/22/2013  . Osteopenia, senile 03/09/2013  . Constipation 01/04/2013  . Hypertension 05/07/2012  . Hyperlipidemia 05/07/2012  . Diabetes mellitus type 2, diet-controlled ([place]) 05/07/2012  . GERD (gastroesophageal reflux disease) 05/07/2012    Orientation RESPIRATION BLADDER Height & Weight     Self, Time, Situation, Place  Normal Incontinent Weight: 160 lb (72.6 kg) Height:  5\' 6"  (167.6 cm)  BEHAVIORAL SYMPTOMS/MOOD NEUROLOGICAL BOWEL NUTRITION STATUS      Continent Diet(see DC Summary)  AMBULATORY STATUS COMMUNICATION OF NEEDS Skin   Extensive Assist Verbally Other (Comment)(closed left leg wound, silicone dressing)                        Personal Care Assistance Level of Assistance  Bathing, Feeding, Dressing Bathing Assistance: Limited assistance Feeding assistance: Independent Dressing Assistance: Limited assistance     Functional Limitations Info  Hearing   Hearing Info: Impaired(hard of hearing)      SPECIAL CARE FACTORS FREQUENCY  PT (By licensed PT), OT (By licensed OT)     PT Frequency: 5x/wk OT Frequency: 5x/wk            Contractures Contractures Info: Not present    Additional Factors Info  Code Status, Allergies, Psychotropic, Insulin Sliding Scale Code Status Info: Full Allergies Info: Amlodipine, Macrodantin, Metformin And Related, Penicillins Psychotropic Info: Klonopin 0.25mg  daily at bed Insulin Sliding Scale Info: 0-15 units 3x/day with meals       Current Medications (09/26/2019):  This is the current hospital active medication list Current Facility-Administered Medications  Medication Dose Route Frequency Provider Last Rate Last Admin  . albuterol (PROVENTIL) (2.5 MG/3ML) 0.083% nebulizer solution 2.5 mg  2.5 mg Nebulization Q6H PRN Patrecia Pace A, PA-C      . benazepril (LOTENSIN) tablet 5 mg  5 mg Oral Daily Florencia Reasons, MD   5 mg at 09/26/19 1313  . bisacodyl (DULCOLAX) EC tablet 5 mg  5 mg Oral Daily PRN Patrecia Pace A, PA-C      . bisacodyl (DULCOLAX) suppository 10 mg  10 mg Rectal Q1500 Florencia Reasons, MD   10 mg at 09/25/19 1517  . cholecalciferol (VITAMIN D3) tablet 2,000 Units  2,000 Units Oral Daily [name], [name], MD   2,000 Units  at 09/26/19 0951  . clonazePAM (KLONOPIN) disintegrating tablet 0.25 mg  0.25 mg Oral QHS Patrecia Pace A, PA-C   0.25 mg at 09/25/19 2040  . cloNIDine (CATAPRES) tablet 0.1 mg  0.1 mg Oral QHS Florencia Reasons, MD   0.1 mg at 09/25/19 2040  . docusate sodium (COLACE) capsule 100 mg  100 mg Oral BID Patrecia Pace A, PA-C   100 mg at 09/26/19 [id number]  . feeding supplement (GLUCERNA SHAKE) (GLUCERNA SHAKE) liquid 237 mL  237 mL Oral TID BM Florencia Reasons, MD   237 mL at 09/26/19 0956  . fluticasone (FLONASE) 50 MCG/ACT nasal spray 2 spray  2 spray Each Nare Daily Delray Alt, PA-C   2 spray at 09/26/19 0955  . HYDROcodone-acetaminophen (NORCO/VICODIN) 5-325 MG per tablet 1 tablet  1 tablet Oral Q6H PRN [name], [name], MD   1 tablet at 09/26/19 1013  . insulin aspart (novoLOG) injection 0-15 Units  0-15 Units Subcutaneous TID WC Patrecia Pace A, PA-C   2 Units at 09/26/19 1153  . levothyroxine (SYNTHROID) tablet 50 mcg  50 mcg Oral QAC breakfast Delray Alt, PA-C   50 mcg at 09/26/19 [id number]  . loratadine (CLARITIN) tablet 10 mg  10 mg Oral Daily PRN Delray Alt, PA-C      . methocarbamol (ROBAXIN) tablet 500 mg  500 mg Oral Q6H PRN Patrecia Pace A, PA-C   500 mg at 09/26/19 1013   Or  . methocarbamol (ROBAXIN) 500 mg in dextrose 5 % 50 mL IVPB  500 mg Intravenous Q6H PRN Delray Alt, PA-C      . morphine 2 MG/ML injection 0.5-1 mg  0.5-1 mg Intravenous Q2H PRN Patrecia Pace A, PA-C   1 mg at 09/26/19 1151  . multivitamin with minerals tablet 1 tablet  1 tablet Oral Daily Delray Alt, PA-C   1 tablet at 09/26/19 [id number]  . ondansetron (ZOFRAN) injection 4 mg  4 mg Intravenous Q6H PRN Florencia Reasons, MD   4 mg at 09/26/19 0814  . pantoprazole (PROTONIX) EC tablet 40 mg  40 mg Oral Daily Patrecia Pace A, PA-C   40 mg at 09/26/19 [id number]  . polyethylene glycol (MIRALAX / GLYCOLAX) packet 17 g  17 g Oral Daily PRN Patrecia Pace A, PA-C      . pravastatin (PRAVACHOL) tablet 40 mg  40 mg Oral q1800 Patrecia Pace A, PA-C   40 mg at 09/25/19 1845  . senna-docusate (Senokot-S) tablet 1 tablet  1 tablet Oral QHS Florencia Reasons, MD   1 tablet at 09/25/19 2040  . vitamin B-12 (CYANOCOBALAMIN) tablet 1,000 mcg  1,000 mcg Oral Daily Florencia Reasons, MD   1,000 mcg at 09/26/19 [id number]     Discharge Medications: Please see discharge summary for a list of discharge medications.  Relevant Imaging Results:  Relevant Lab Results:   Additional Information SS#:  [id number]  Geralynn Ochs, LCSW

## 2019-09-26 NOTE — Plan of Care (Signed)

## 2019-09-26 NOTE — Progress Notes (Signed)
Orthopaedic Trauma Progress Note  S: Continues to have a lot of pain in the left hip.  No other complaints this morning.  Feeling a bit discouraged about her progress.  After having Covid in December she feels that she is extremely weak and has not been able to regain any of her baseline strength prior to this injury.  Patient states therapies have recommended compression stockings to help with swelling in her left leg, she states she has worn these in the past and they are uncomfortable.  She refuses to wear them now.  O:  Vitals:   09/26/19 0356 09/26/19 0800  BP: (!) 108/49 (!) 160/70  Pulse: 62 70  Resp:  16  Temp: 98.5 F (36.9 C) 98.2 F (36.8 C)  SpO2: 96% 99%   Gen: NAD, AAO LLE: Incisions clean, dry and intact. Mild bruising.  Tolerates small amount of passive knee motion.  Tender with palpation of the lateral hip and thigh.  Nontender in the lower leg.  Ankle dorsiflexion and plantarflexion is intact.  Neurovascularly intact distally  Imaging: Stable postop imaging  Labs:  Results for orders placed or performed during the hospital encounter of 09/22/19 (from the past 24 hour(s))  Glucose, capillary     Status: Abnormal   Collection Time: 09/25/19 11:44 AM  Result Value Ref Range   Glucose-Capillary 148 (H) 70 - 99 mg/dL  CBC     Status: Abnormal   Collection Time: 09/25/19  3:48 PM  Result Value Ref Range   WBC 5.9 4.0 - 10.5 K/uL   RBC 2.41 (L) 3.87 - 5.11 MIL/uL   Hemoglobin 7.9 (L) 12.0 - 15.0 g/dL   HCT 23.0 (L) 36.0 - 46.0 %   MCV 95.4 80.0 - 100.0 fL   MCH 32.8 26.0 - 34.0 pg   MCHC 34.3 30.0 - 36.0 g/dL   RDW 14.7 11.5 - 15.5 %   Platelets 134 (L) 150 - 400 K/uL   nRBC 0.0 0.0 - 0.2 %  Glucose, capillary     Status: None   Collection Time: 09/25/19  4:26 PM  Result Value Ref Range   Glucose-Capillary 74 70 - 99 mg/dL  SARS CORONAVIRUS 2 (TAT 6-24 HRS) Nasopharyngeal Nasopharyngeal Swab     Status: None   Collection Time: 09/25/19  7:14 PM   Specimen:  Nasopharyngeal Swab  Result Value Ref Range   SARS Coronavirus 2 NEGATIVE NEGATIVE  CBC     Status: Abnormal   Collection Time: 09/26/19  4:43 AM  Result Value Ref Range   WBC 6.0 4.0 - 10.5 K/uL   RBC 2.36 (L) 3.87 - 5.11 MIL/uL   Hemoglobin 7.6 (L) 12.0 - 15.0 g/dL   HCT 23.1 (L) 36.0 - 46.0 %   MCV 97.9 80.0 - 100.0 fL   MCH 32.2 26.0 - 34.0 pg   MCHC 32.9 30.0 - 36.0 g/dL   RDW 15.0 11.5 - 15.5 %   Platelets 146 (L) 150 - 400 K/uL   nRBC 0.0 0.0 - 0.2 %  Comprehensive metabolic panel     Status: Abnormal   Collection Time: 09/26/19  4:43 AM  Result Value Ref Range   Sodium 139 135 - 145 mmol/L   Potassium 4.1 3.5 - 5.1 mmol/L   Chloride 105 98 - 111 mmol/L   CO2 27 22 - 32 mmol/L   Glucose, Bld 125 (H) 70 - 99 mg/dL   BUN 15 8 - 23 mg/dL   Creatinine, Ser 1.14 (H) 0.44 -  1.00 mg/dL   Calcium 8.1 (L) 8.9 - 10.3 mg/dL   Total Protein 4.3 (L) 6.5 - 8.1 g/dL   Albumin 2.2 (L) 3.5 - 5.0 g/dL   AST 16 15 - 41 U/L   ALT 14 0 - 44 U/L   Alkaline Phosphatase 45 38 - 126 U/L   Total Bilirubin 0.7 0.3 - 1.2 mg/dL   GFR calc non Af Amer 44 (L) >60 mL/min   GFR calc Af Amer 51 (L) >60 mL/min   Anion gap 7 5 - 15  Glucose, capillary     Status: Abnormal   Collection Time: 09/26/19  8:02 AM  Result Value Ref Range   Glucose-Capillary 105 (H) 70 - 99 mg/dL  Occult blood card to lab, stool     Status: None   Collection Time: 09/26/19  8:32 AM  Result Value Ref Range   Fecal Occult Bld NEGATIVE NEGATIVE    Assessment: 86 yo female s/p ground level fall  Injuries: Left intertrochanteric femur fracture s/p IMN 09/23/2019  Weightbearing: WBAT LLE  Insicional and dressing care: Okay to leave incisions open to the air  Orthopedic device(s):None needed  CV/Blood loss: Hgb 7.6 this AM, acute blood loss anemia.  Hemodynamically stable.  Received 1 unit PRBCs yesterday.  Continue to monitor CBC  Pain management: 1. Norco 1 tab q 6 hours PRN 2. Robaxin 500 mg q 6 hours PRN 3. Morphine  0.5-1 mg q 2 hours   VTE prophylaxis: Would restart home xarelto once hgb stablized.  SCDs: In place bilateral lower extremities  ID: Clindamycin x 24 hrs completed  Foley/Lines: None  Medical co-morbidities: Diabetes-SSI A fib-on Xarelto  Impediments to Fracture Healing: Vit D deficiency, continue  vit D3 supplementation  Dispo: Up with therapies as tolerated.  PT/OT recommending SNF  Follow - up plan: 2 weeks   Myron Stankovich A. Carmie K[facility] [phone] (office) [email or web address]

## 2019-09-26 NOTE — TOC Initial Note (Signed)
Transition of Care [facility]) - Initial/Assessment Note    Patient Details  Name: Katie [name] MRN: [id number] Date of Birth: 02-25-1932  Transition of Care [facility]) CM/SW Contact:    Geralynn Ochs, LCSW Phone Number: 09/26/2019, 4:31 PM  Clinical Narrative:    CSW met with patient to discuss SNF, and patient requested that CSW call her son, Louie Casa. CSW spoke with Louie Casa on the phone while in the room with patient, to update both of them at the same time. Patient and son in agreement to SNF, unsure where they would like to go. Louie Casa discussed that his dad has been in SNF before and his mom wasn't super happy with any of the ones that they sent him to, so they would be interested in seeing what was available. CSW discussed with patient after hanging up with Louie Casa about her concerns with insurance coverage and finances, because she is worried that she won't make enough progress within 20 days to go home and she doesn't have much money to pay for the copays if she gets charged extra. CSW offered support to patient to do what she could, and that she did not have to worry about all of that at this very moment. CSW to fax out referral and will follow up with bed offers.     Expected Discharge Plan: Skilled Nursing Facility Barriers to Discharge: Continued Medical Work up, Ship broker   Patient Goals and CMS Choice Patient states their goals for this hospitalization and ongoing recovery are:: to not be in so much pain, get back home if possible CMS Medicare.gov Compare Post Acute Care list provided to:: Patient Choice offered to / list presented to : Patient  Expected Discharge Plan and Services Expected Discharge Plan: [place] Choice: [place] arrangements for the past 2 months: Single Family Home                                      Prior Living Arrangements/Services Living arrangements for the past 2 months:  Single Family Home Lives with:: Self Patient language and need for interpreter reviewed:: No Do you feel safe going back to the place where you live?: Yes      Need for Family Participation in Patient Care: Yes (Comment) Care giver support system in place?: No (comment)   Criminal Activity/Legal Involvement Pertinent to Current Situation/Hospitalization: No - Comment as needed  Activities of Daily Living Home Assistive Devices/Equipment: Gilford Rile (specify type), Wheelchair    Permission Sought/Granted Permission sought to share information with : Facility Sport and exercise psychologist, Family Supports Permission granted to share information with : Yes, Verbal Permission Granted  Share Information with NAME: Louie Casa  Permission granted to share info w AGENCY: SNF  Permission granted to share info w Relationship: Son     Emotional Assessment Appearance:: Appears stated age Attitude/Demeanor/Rapport: Engaged Affect (typically observed): Pleasant Orientation: : Oriented to Self, Oriented to Place, Oriented to  Time, Oriented to Situation Alcohol / Substance Use: Not Applicable Psych Involvement: No (comment)  Admission diagnosis:  Hip fracture ([place]) [S72.009A] Closed fracture of left hip, initial encounter [facility]) [S72.002A] Patient Active Problem List   Diagnosis Date Noted  . Displaced intertrochanteric fracture of left femur, initial encounter for closed fracture ([place]) 09/22/2019  . Regurgitation of food 07/20/2019  . Chronic midline low back pain without sciatica 10/22/2018  . Primary  insomnia 10/22/2018  . Persistent atrial fibrillation ([place]) 04/27/2017  . Chronic diastolic CHF (congestive heart failure) ([place]) 04/27/2017  . CKD (chronic kidney disease) stage 3, GFR 30-59 ml/min 05/14/2015  . BMI 26.0-26.9,adult 05/11/2015  . Peripheral edema 07/06/2014  . Hypokalemia 07/22/2013  . Hypothyroidism 07/22/2013  . Osteopenia, senile 03/09/2013  . Constipation 01/04/2013  . Hypertension  05/07/2012  . Hyperlipidemia 05/07/2012  . Diabetes mellitus type 2, diet-controlled ([place]) 05/07/2012  . GERD (gastroesophageal reflux disease) 05/07/2012   PCP:  [name], FNP Pharmacy:   [facility] [address] [place] [address] [place] [address] Phone: [phone] Fax: [phone]  [place], [place] [address] [place] [address] Phone: [phone] Fax: [phone]     Social Determinants of Health (SDOH) Interventions    Readmission Risk Interventions No flowsheet data found.

## 2019-09-26 NOTE — Plan of Care (Signed)

## 2019-09-27 LAB — BASIC METABOLIC PANEL
Anion gap: 7 (ref 5–15)
BUN: 13 mg/dL (ref 8–23)
CO2: 28 mmol/L (ref 22–32)
Calcium: 8.3 mg/dL — ABNORMAL LOW (ref 8.9–10.3)
Chloride: 105 mmol/L (ref 98–111)
Creatinine, Ser: 1.06 mg/dL — ABNORMAL HIGH (ref 0.44–1.00)
GFR calc Af Amer: 55 mL/min — ABNORMAL LOW (ref >60)
GFR calc non Af Amer: 48 mL/min — ABNORMAL LOW (ref >60)
Glucose, Bld: 137 mg/dL — ABNORMAL HIGH (ref 70–99)
Potassium: 4.6 mmol/L (ref 3.5–5.1)
Sodium: 140 mmol/L (ref 135–145)

## 2019-09-27 LAB — CBC
HCT: 24.1 % — ABNORMAL LOW (ref 36.0–46.0)
Hemoglobin: 8 g/dL — ABNORMAL LOW (ref 12.0–15.0)
MCH: 33.2 pg (ref 26.0–34.0)
MCHC: 33.2 g/dL (ref 30.0–36.0)
MCV: 100 fL (ref 80.0–100.0)
Platelets: 181 10*3/uL (ref 150–400)
RBC: 2.41 MIL/uL — ABNORMAL LOW (ref 3.87–5.11)
RDW: 14.4 % (ref 11.5–15.5)
WBC: 5.5 10*3/uL (ref 4.0–10.5)
nRBC: 0 % (ref 0.0–0.2)

## 2019-09-27 LAB — GLUCOSE, CAPILLARY
Glucose-Capillary: 141 mg/dL — ABNORMAL HIGH (ref 70–99)
Glucose-Capillary: 167 mg/dL — ABNORMAL HIGH (ref 70–99)
Glucose-Capillary: 137 mg/dL — ABNORMAL HIGH (ref 70–99)
Glucose-Capillary: 134 mg/dL — ABNORMAL HIGH (ref 70–99)

## 2019-09-27 LAB — SARS CORONAVIRUS 2 (TAT 6-24 HRS): SARS Coronavirus 2: NEGATIVE

## 2019-09-27 MED ORDER — ENSURE ENLIVE PO LIQD
237.0000 mL | Freq: Two times a day (BID) | ORAL | Status: DC
  Administered 2019-09-27 – 2019-09-28 (×4): 237 mL via ORAL

## 2019-09-27 MED ORDER — RIVAROXABAN 15 MG PO TABS
15.0000 mg | ORAL_TABLET | Freq: Every day | ORAL | Status: DC
  Administered 2019-09-27 – 2019-09-28 (×2): 15 mg via ORAL
  Filled 2019-09-27 (×2): qty 1

## 2019-09-27 NOTE — TOC Progression Note (Signed)
Transition of Care [facility]) - Progression Note    Patient Details  Name: Katie [name] MRN: [id number] Date of Birth: 1931-10-01  Transition of Care [facility]) CM/SW [place], LCSW Phone Number: 09/27/2019, 10:42 AM  Clinical Narrative:    CSW spoke with patient's son Louie Casa and they have chosen [employer]. CSW spoke with Elyse Hsu and she will start insurance auth. COVID test has been collected today. Anticipate discharge tomorrow.   TOC will continue to follow.    Expected Discharge Plan: [place] Barriers to Discharge: Continued Medical Work up, Ship broker  Expected Discharge Plan and Services Expected Discharge Plan: [place] Choice: [place] arrangements for the past 2 months: Single Family Home                                       Social Determinants of Health (SDOH) Interventions    Readmission Risk Interventions No flowsheet data found.

## 2019-09-27 NOTE — Progress Notes (Signed)
Occupational Therapy Treatment Patient Details Name: Katie [name] MRN: [id number] DOB: 1932-03-08 Today's Date: 09/27/2019    History of present illness 86 yo female admitted to ED on 2/4 with fall, s/p IM nail L femur on 2/5. CT head and spine negative for acute changes. PMH includes atrial fibrillation and flutter on Xarelto, CKD, history of recent COVID-19 infection, NIDDM, hypertension, hyperlipidemia, anxiety, HF, and osteoporosis   OT comments  Pt making good progress with functional goals. Pt in chair upon arrival and requesting to go back to bed. Pt agreeable to [facility] transfers first and to EOB activity. Session focused on ADL transfers, toileting, grooming and UB ADLs seated. Opt required max A with LEs back onto bed. Possible d/c to SNF tomorrow. OT will continue to follow acutely  Follow Up Recommendations  SNF    Equipment Recommendations  Other (comment)(TBD at next venue of care)    Recommendations for Other Services      Precautions / Restrictions Precautions Precautions: Fall Precaution Comments: watch BP Restrictions Weight Bearing Restrictions: Yes LLE Weight Bearing: Weight bearing as tolerated       Mobility Bed Mobility Overal bed mobility: Needs Assistance Bed Mobility: Sit to Supine           General bed mobility comments: max A with LEs back onto bed  Transfers Overall transfer level: Needs assistance Equipment used: Rolling walker (2 wheeled) Transfers: Sit to/from [facility] Sit to Stand: Mod assist;Min assist Stand pivot transfers: Min assist       General transfer comment: cues for correct hand placement    Balance Overall balance assessment: Needs assistance Sitting-balance support: Bilateral upper extremity supported;Feet supported;Single extremity supported Sitting balance-Leahy Scale: Fair     Standing balance support: Bilateral upper extremity supported;During functional activity Standing balance-Leahy Scale:  Poor                             ADL either performed or assessed with clinical judgement   ADL Overall ADL's : Needs assistance/impaired Eating/Feeding: Set up;Independent;Sitting   Grooming: Wash/dry hands;Wash/dry face;Set up;Supervision/safety;Sitting   Upper Body Bathing: Min guard;Sitting Upper Body Bathing Details (indicate cue type and reason): simulated     Upper Body Dressing : Min guard;Sitting       Toilet Transfer: Moderate assistance;Minimal assistance;Stand-pivot;RW;BSC;Cueing for safety;Cueing for sequencing   Toileting- Clothing Manipulation and Hygiene: Total assistance;Sit to/from stand       Functional mobility during ADLs: Moderate assistance;Minimal assistance;Rolling walker;Cueing for safety;Cueing for sequencing General ADL Comments: pt very anxious     Vision Patient Visual Report: No change from baseline     Perception     Praxis      Cognition Arousal/Alertness: Awake/alert Behavior During Therapy: Anxious Overall Cognitive Status: Within Functional Limits for tasks assessed                                 General Comments: pt very anxious regarding pain and syncope/BP        Exercises     Shoulder Instructions       General Comments      Pertinent Vitals/ Pain       Pain Assessment: 0-10 Faces Pain Scale: Hurts even more Pain Location: L LE  with mobility Pain Descriptors / Indicators: Grimacing;Guarding;Moaning;Sore Pain Intervention(s): Limited activity within patient's tolerance;Monitored during session;Premedicated before session;Repositioned  Home Living  Prior Functioning/Environment              Frequency  Min 2X/week        Progress Toward Goals  OT Goals(current goals can now be found in the care plan section)  Progress towards OT goals: Progressing toward goals  Acute Rehab OT Goals Patient Stated Goal: go home   Plan Discharge plan remains appropriate    Co-evaluation                 AM-PAC OT "6 Clicks" Daily Activity     Outcome Measure   Help from another person eating meals?: None Help from another person taking care of personal grooming?: A Little Help from another person toileting, which includes using toliet, bedpan, or urinal?: A Lot Help from another person bathing (including washing, rinsing, drying)?: A Lot Help from another person to put on and taking off regular upper body clothing?: A Little Help from another person to put on and taking off regular lower body clothing?: Total 6 Click Score: 15    End of Session Equipment Utilized During Treatment: Gait belt;Rolling walker;Other (comment)(BSC)  OT Visit Diagnosis: Unsteadiness on feet (R26.81);Other abnormalities of gait and mobility (R26.89);Muscle weakness (generalized) (M62.81);History of falling (Z91.81);Pain Pain - Right/Left: Left Pain - part of body: Hip;Leg   Activity Tolerance Other (comment);Patient limited by pain(anxiety)   Patient Left with call bell/phone within reach;in bed;with bed alarm set   Nurse Communication          Time: [id number] OT Time Calculation (min): 28 min  Charges: OT General Charges $OT Visit: 1 Visit OT Treatments $Self Care/Home Management : 8-22 mins $Therapeutic Activity: 8-22 mins     Britt Bottom 09/27/2019, 2:41 PM

## 2019-09-27 NOTE — Plan of Care (Signed)
  Problem: Education: Goal: Knowledge of General Education information will improve Description: Including pain rating scale, medication(s)/side effects and non-pharmacologic comfort measures Outcome: Adequate for Discharge   Problem: Health Behavior/Discharge Planning: Goal: Ability to manage health-related needs will improve Outcome: Adequate for Discharge   Problem: Clinical Measurements: Goal: Ability to maintain clinical measurements within normal limits will improve Outcome: Adequate for Discharge Goal: Will remain free from infection Outcome: Adequate for Discharge   

## 2019-09-27 NOTE — Progress Notes (Signed)
Nutrition Follow-up  RD working remotely.  DOCUMENTATION CODES:   Not applicable  INTERVENTION:   -D/c Glucerna Shake po TID, each supplement provides 220 kcal and 10 grams of protein -Ensure Enlive po BID, each supplement provides 350 kcal and 20 grams of protein -Continue MVI with minerals daily -Magic cup TID with meals, each supplement provides 290 kcal and 9 grams of protein  NUTRITION DIAGNOSIS:   Increased nutrient needs related to post-op healing as evidenced by estimated needs.  Ongoing  GOAL:   Patient will meet greater than or equal to 90% of their needs  Unmet  MONITOR:   PO intake, Supplement acceptance, Diet advancement, Labs, Weight trends, Skin, I & O's  REASON FOR ASSESSMENT:   Consult Hip fracture protocol  ASSESSMENT:   Katie [name] is a 86 y.o. female with medical history significant of diet-controlled DM; hypothyroidism; HTN; HLD;  Stage 3 CKD; chronic diastolic CHF; and afib on Xarelto presenting with a fall.  On Thursday AM, she gets her hair done.  She went out to start her car to warm it up.  When she got out, she slipped on gravel and slid onto the ground.  She was unable to get up.  She had her phone and called her niece and they called 46.  She was very cold, lying on the ground.  She felt fine this AM prior to the fall.  Her last dose of Xarelto was about 5pm on 2/3.  2/5- s/p Cephalomedullary nailing of left intertrochanteric femur fracture  Reviewed I/O's: -530 ml x 24 hours and +3.2 L since admission  UOP: 1.3 L x 24 hours  Attempted to speak with pt via phone, however, no answer.   Per MD notes, pt vomited after breakfast on 09/24/19. Pt is requesting downgrade to full liquid diet secondary to history of esophageal dysmotility issues. Pt with poor oral intake; noted meal completion 25-50%.   Pt with poor oral intake and would benefit from nutrient dense supplement. One Ensure Enlive supplement provides 350 kcals, 20 grams protein, and  44-45 grams of carbohydrate vs one Glucerna shake supplement, which provides 220 kcals, 10 grams of protein, and 26 grams of carbohydrate. Given pt's hx of DM, RD will continue to monitor PO intake, CBGS, and adjust supplement regimen as appropriate.   Per CSW notes, plan to d/c to SNF for short term rehab, possible tomorrow (09/28/19).   Medications reviewed and include colace, senokot, and vitamin B-12.   Labs reviewed: CBGS: 103-141 (inpatient orders for glycemic control are 0-15 units insulin aspart TID with meals).   Diet Order:   Diet Order            Diet full liquid Room service appropriate? Yes; Fluid consistency: Thin  Diet effective now              EDUCATION NEEDS:   No education needs have been identified at this time  Skin:  Skin Assessment: Skin Integrity Issues: Skin Integrity Issues:: Incisions Incisions: closed lt leg  Last BM:  09/26/19  Height:   Ht Readings from Last 1 Encounters:  09/22/19 5\' 6"  (1.676 m)    Weight:   Wt Readings from Last 1 Encounters:  09/22/19 72.6 kg    Ideal Body Weight:  59.1 kg  BMI:  Body mass index is 25.82 kg/m.  Estimated Nutritional Needs:   Kcal:  1650-1850  Protein:  75-90 grams  Fluid:  > 1.6 L    Loistine Chance, RD, LDN, CDCES  Registered Dietitian II Certified Diabetes Care and Education Specialist Please refer to [facility] for RD and/or RD on-call/weekend/after hours pager

## 2019-09-27 NOTE — Plan of Care (Signed)

## 2019-09-27 NOTE — Progress Notes (Signed)
PROGRESS NOTE    Katie [name]  AQT:[id number] DOB: [birth year] DOA: 09/22/2019 PCP: [name], FNP   Brief Narrative:  Katie [name] a 86 y.o.femalewith medical history significant ofdiet-controlled DM; hypothyroidism; HTN; HLD; Stage 3 CKD; chronic diastolic CHF; and afib on Xareltopresenting with a fall.On 13-Oct-2022 AM, she gets her hair done. She went out to start her car to warm it up. When she got out, she slipped on gravel and slid onto the ground. She was unable to get up. She had her phone and called her niece and they called 71. She was very cold, lying on the ground. She felt fine this AM prior to the fall. Her last dose of Xarelto was about 5pm on 2/3.   She reports that she has had a very hard time since her husband died in 10/13/18. She had dysphagia and was admitted at [facility] from 12/16-19 with GERD; she was found to have COVID-19 infection and an E coli UTI. She had an EGD with Dr. Carlean Purl on 1/26 and was found to have dysmotility/presbyesophagus and a Hill Grade III gastroesophageal flap.   Interim history: Left hip repair 09/23/19 with some drop in Hg and blood transfusion, stable Hg 09/27/19 and resumed home xarelto. D/C to SNF when bed available.  Assessment & Plan:   Principal Problem:   Displaced intertrochanteric fracture of left femur, initial encounter for closed fracture (HCC) Active Problems:   Hypertension   Hyperlipidemia   Diabetes mellitus type 2, diet-controlled (HCC)   GERD (gastroesophageal reflux disease)   Hypothyroidism   CKD (chronic kidney disease) stage 3, GFR 30-59 ml/min   Persistent atrial fibrillation (HCC)   Chronic diastolic CHF (congestive heart failure) (HCC)  Left hip fx--after mechanical fall IMN with Dr. [name] on 2/5 xareltorestarted 09/27/19 with stable Hg for 2 days and negative FOBT Ortho recommended weightbearing as tolerated to the left lower extremity Management per ortho -SNF for rehab when bed  available  Acute blood loss anemia Macrocytic anemia Low b12, startedb12 supplement FOBT negative prbc x1unit2/7/21 Resume xarelto 09/27/19 due to stable Hg levels  thrombocytopnea stabilized monitor  PAF, h/o ablation of aflutter followed by Dr Rayann Heman chronic diastolic chf with preserved lvef currently sinus rhythm Denies chest pain, no sob, no edema Close monitor volume status  Diet controlled DM2, h/o metformin intolerance  Fasting blood glucose 130 On ssi here F/u with pcp  HTN - decreased Catapresdecreasedfrom bid to qhs lotensin resumed  CKDIII Appear baseline  HLD -Continue Mevacor (Pravastatin formulary substitution)  GERD -Continue Protonix  H/o esophageal dysmotility: Son reports patient has been having trouble swallowing since she got sick from covid in 07/2019. Patient has lost 20lbs since then She is followed by gi Dr Carlean Purl She prefers dietto bechanged to full liquid, continue glucerna  Hypothyroidism -TSH 2.1 -Continue Synthroid at current dose for now  DVT prophylaxis: Resume xarelto today as Hg stable Code Status: Full Family Communication: patient only Disposition Plan:  . Patient came from:home            . Anticipated d/c place:SNF . Barriers to d/c OR conditions which need to be met to effect a safe d/c: SNF when covid-19 negative and bed in place  Consultants:   Orthopedics  Procedures:   Left hip repair 09/23/19 Dr. [name]  Antimicrobials:  Peri-operative only  Subjective: Feeling okay this morning, pain still in the hip. She is worried about transport to SNF cost and if this will be covered. Denies chest pains or SOB.  Ate a little more yesterday but is still on full liquid diet with the swallowing problems which is frustrating her some. Denies abdominal pain but is some gassy. Last BM 2 days ago.   Objective: Vitals:   09/27/19 0813 09/27/19 1048 09/27/19 1052 09/27/19 1101  BP: (!) 144/59 (!) 163/86 119/68  132/63  Pulse: 71 98 (!) 127 78  Resp: 16     Temp: 98.5 F (36.9 C)     TempSrc: Oral     SpO2: 94%     Weight:      Height:        Intake/Output Summary (Last 24 hours) at 09/27/2019 1111 Last data filed at 09/27/2019 1000 Gross per 24 hour  Intake 720 ml  Output 1250 ml  Net -530 ml   Filed Weights   09/22/19 1022  Weight: 72.6 kg    Examination:  General exam: Appears calm and comfortable, some pain Respiratory system: Clear to auscultation. Respiratory effort normal. Cardiovascular system: S1 & S2 heard, RRR. No JVD, murmurs, rubs, gallops or clicks. No pedal edema. Gastrointestinal system: Abdomen is nondistended, soft and nontender. No organomegaly or masses felt. Normal bowel sounds heard. Central nervous system: Alert and oriented. No focal neurological deficits. Extremities: left leg does not want to move due to pain Skin: No rashes, lesions or ulcers Psychiatry: Judgement and insight appear normal. Mood & affect appropriate.   Data Reviewed: I have personally reviewed following labs and imaging studies  CBC: Recent Labs  Lab 09/22/19 1035 09/23/19 0240 09/24/19 1549 09/25/19 0401 09/25/19 1548 09/26/19 0443 09/27/19 0336  WBC 7.8   < > 10.4 6.0 5.9 6.0 5.5  NEUTROABS 5.6  --   --   --   --   --   --   HGB 11.4*   < > 7.9* 6.6* 7.9* 7.6* 8.0*  HCT 34.7*   < > 23.3* 19.5* 23.0* 23.1* 24.1*  MCV 101.5*   < > 100.0 98.5 95.4 97.9 100.0  PLT 159   < > 121* 128* 134* 146* 181   < > = values in this interval not displayed.   Basic Metabolic Panel: Recent Labs  Lab 09/23/19 0240 09/24/19 0731 09/25/19 0401 09/26/19 0443 09/27/19 0336  NA 140 137 136 139 140  K 4.1 4.3 4.0 4.1 4.6  CL 100 102 102 105 105  CO2 _0 GLUCOSE 130* 118* 99 125* 137*  BUN _1 CREATININE 1.45* 1.28* 1.34* 1.14* 1.06*  CALCIUM 8.8* 8.5* 8.1* 8.1* 8.3*   GFR: Estimated Creatinine Clearance: 39.6 mL/min (A) (by C-G formula based on SCr of 1.06 mg/dL  (H)). Liver Function Tests: Recent Labs  Lab 09/22/19 1035 09/26/19 0443  AST 22 16  ALT 13 14  ALKPHOS 62 45  BILITOT 0.7 0.7  PROT 6.0* 4.3*  ALBUMIN 3.5 2.2*   No results for input(s): LIPASE, AMYLASE in the last 168 hours. No results for input(s): AMMONIA in the last 168 hours. Coagulation Profile: Recent Labs  Lab 09/22/19 1035  INR 1.6*   Cardiac Enzymes: No results for input(s): CKTOTAL, CKMB, CKMBINDEX, TROPONINI in the last 168 hours. BNP (last 3 results) No results for input(s): PROBNP in the last 8760 hours. HbA1C: No results for input(s): HGBA1C in the last 72 hours. CBG: Recent Labs  Lab 09/26/19 0802 09/26/19 1138 09/26/19 1639 09/26/19 2045 09/27/19 0639  GLUCAP 105* 140* 138* 103* 141*   Lipid Profile: No results for input(s):  CHOL, HDL, LDLCALC, TRIG, CHOLHDL, LDLDIRECT in the last 72 hours. Thyroid Function Tests: No results for input(s): TSH, T4TOTAL, FREET4, T3FREE, THYROIDAB in the last 72 hours. Anemia Panel: No results for input(s): VITAMINB12, FOLATE, FERRITIN, TIBC, IRON, RETICCTPCT in the last 72 hours. Sepsis Labs: No results for input(s): PROCALCITON, LATICACIDVEN in the last 168 hours.  Recent Results (from the past 240 hour(s))  Respiratory Panel by RT PCR (Flu A&B, Covid) - Nasopharyngeal Swab     Status: None   Collection Time: 09/22/19 11:32 AM   Specimen: Nasopharyngeal Swab  Result Value Ref Range Status   SARS Coronavirus 2 by RT PCR NEGATIVE NEGATIVE Final    Comment: (NOTE) SARS-CoV-2 target nucleic acids are NOT DETECTED. The SARS-CoV-2 RNA is generally detectable in upper respiratoy specimens during the acute phase of infection. The lowest concentration of SARS-CoV-2 viral copies this assay can detect is 131 copies/mL. A negative result does not preclude SARS-Cov-2 infection and should not be used as the sole basis for treatment or other patient management decisions. A negative result may occur with  improper specimen  collection/handling, submission of specimen other than nasopharyngeal swab, presence of viral mutation(s) within the areas targeted by this assay, and inadequate number of viral copies (<131 copies/mL). A negative result must be combined with clinical observations, patient history, and epidemiological information. The expected result is Negative. Fact Sheet for Patients:  PinkCheek.be Fact Sheet for Healthcare Providers:  GravelBags.it This test is not yet ap proved or cleared by the [place] FDA and  has been authorized for detection and/or diagnosis of SARS-CoV-2 by FDA under an Emergency Use Authorization (EUA). This EUA will remain  in effect (meaning this test can be used) for the duration of the COVID-19 declaration under Section 564(b)(1) of the Act, 21 U.S.C. section 360bbb-3(b)(1), unless the authorization is terminated or revoked sooner.    Influenza A by PCR NEGATIVE NEGATIVE Final   Influenza B by PCR NEGATIVE NEGATIVE Final    Comment: (NOTE) The Xpert Xpress SARS-CoV-2/FLU/RSV assay is intended as an aid in  the diagnosis of influenza from Nasopharyngeal swab specimens and  should not be used as a sole basis for treatment. Nasal washings and  aspirates are unacceptable for Xpert Xpress SARS-CoV-2/FLU/RSV  testing. Fact Sheet for Patients: PinkCheek.be Fact Sheet for Healthcare Providers: GravelBags.it This test is not yet approved or cleared by the [place] FDA and  has been authorized for detection and/or diagnosis of SARS-CoV-2 by  FDA under an Emergency Use Authorization (EUA). This EUA will remain  in effect (meaning this test can be used) for the duration of the  Covid-19 declaration under Section 564(b)(1) of the Act, 21  U.S.C. section 360bbb-3(b)(1), unless the authorization is  terminated or revoked. Performed at [place], [place] [address], [place], [place] [address]   Surgical pcr screen     Status: None   Collection Time: 09/22/19  5:32 PM   Specimen: Nasal Mucosa; Nasal Swab  Result Value Ref Range Status   MRSA, PCR NEGATIVE NEGATIVE Final   Staphylococcus aureus NEGATIVE NEGATIVE Final    Comment: (NOTE) The Xpert SA Assay (FDA approved for NASAL specimens in patients 84 years of age and older), is one component of a comprehensive surveillance program. It is not intended to diagnose infection nor to guide or monitor treatment. Performed at [facility], [place] [address], [place], [place] [address]   Culture, Urine     Status: Abnormal  Collection Time: 09/23/19  5:27 PM   Specimen: Urine, Random  Result Value Ref Range Status   Specimen Description URINE, RANDOM  Final   Special Requests   Final    NONE Performed at [facility], [address] [place], [place] [address]    Culture MULTIPLE SPECIES PRESENT, SUGGEST RECOLLECTION (A)  Final   Report Status 09/24/2019 FINAL  Final  SARS CORONAVIRUS 2 (TAT 6-24 HRS) Nasopharyngeal Nasopharyngeal Swab     Status: None   Collection Time: 09/25/19  7:14 PM   Specimen: Nasopharyngeal Swab  Result Value Ref Range Status   SARS Coronavirus 2 NEGATIVE NEGATIVE Final    Comment: (NOTE) SARS-CoV-2 target nucleic acids are NOT DETECTED. The SARS-CoV-2 RNA is generally detectable in upper and lower respiratory specimens during the acute phase of infection. Negative results do not preclude SARS-CoV-2 infection, do not rule out co-infections with other pathogens, and should not be used as the sole basis for treatment or other patient management decisions. Negative results must be combined with clinical observations, patient history, and epidemiological information. The expected result is Negative. Fact Sheet for Patients: SugarRoll.be Fact Sheet for Healthcare  Providers: https://www.woods-mathews.com/ This test is not yet approved or cleared by the [place] FDA and  has been authorized for detection and/or diagnosis of SARS-CoV-2 by FDA under an Emergency Use Authorization (EUA). This EUA will remain  in effect (meaning this test can be used) for the duration of the COVID-19 declaration under Section 56 4(b)(1) of the Act, 21 U.S.C. section 360bbb-3(b)(1), unless the authorization is terminated or revoked sooner. Performed at [facility], [place] [address], [place], [place] [address]     Radiology Studies: No results found.  Scheduled Meds: . benazepril  5 mg Oral Daily  . bisacodyl  10 mg Rectal Q1500  . cholecalciferol  2,000 Units Oral Daily  . clonazePAM  0.25 mg Oral QHS  . cloNIDine  0.1 mg Oral QHS  . docusate sodium  100 mg Oral BID  . feeding supplement (GLUCERNA SHAKE)  237 mL Oral TID BM  . fluticasone  2 spray Each Nare Daily  . insulin aspart  0-15 Units Subcutaneous TID WC  . levothyroxine  50 mcg Oral QAC breakfast  . multivitamin with minerals  1 tablet Oral Daily  . pantoprazole  40 mg Oral Daily  . pravastatin  40 mg Oral q1800  . senna-docusate  1 tablet Oral QHS  . vitamin B-12  1,000 mcg Oral Daily   Continuous Infusions: . methocarbamol (ROBAXIN) IV      LOS: 5 days    Time spent: 64  Hoyt Koch, MD [facility]  To contact the attending provider between 7A-7P or the covering provider during after hours 7P-7A, please log into the web site [email or web address] and access using universal [place] password for that web site. If you do not have the password, please call the hospital operator.  09/27/2019, 11:11 AM

## 2019-09-28 DIAGNOSIS — M6281 Muscle weakness (generalized): Secondary | ICD-10-CM | POA: Diagnosis not present

## 2019-09-28 DIAGNOSIS — S72002S Fracture of unspecified part of neck of left femur, sequela: Secondary | ICD-10-CM | POA: Diagnosis not present

## 2019-09-28 DIAGNOSIS — M2550 Pain in unspecified joint: Secondary | ICD-10-CM | POA: Diagnosis not present

## 2019-09-28 DIAGNOSIS — Z9181 History of falling: Secondary | ICD-10-CM | POA: Diagnosis not present

## 2019-09-28 DIAGNOSIS — Z760 Encounter for issue of repeat prescription: Secondary | ICD-10-CM | POA: Diagnosis not present

## 2019-09-28 DIAGNOSIS — I10 Essential (primary) hypertension: Secondary | ICD-10-CM | POA: Diagnosis not present

## 2019-09-28 DIAGNOSIS — E119 Type 2 diabetes mellitus without complications: Secondary | ICD-10-CM | POA: Diagnosis not present

## 2019-09-28 DIAGNOSIS — N1832 Chronic kidney disease, stage 3b: Secondary | ICD-10-CM | POA: Diagnosis not present

## 2019-09-28 DIAGNOSIS — I130 Hypertensive heart and chronic kidney disease with heart failure and stage 1 through stage 4 chronic kidney disease, or unspecified chronic kidney disease: Secondary | ICD-10-CM | POA: Diagnosis not present

## 2019-09-28 DIAGNOSIS — Z7401 Bed confinement status: Secondary | ICD-10-CM | POA: Diagnosis not present

## 2019-09-28 DIAGNOSIS — R42 Dizziness and giddiness: Secondary | ICD-10-CM | POA: Diagnosis not present

## 2019-09-28 DIAGNOSIS — S72142D Displaced intertrochanteric fracture of left femur, subsequent encounter for closed fracture with routine healing: Secondary | ICD-10-CM | POA: Diagnosis not present

## 2019-09-28 DIAGNOSIS — R531 Weakness: Secondary | ICD-10-CM | POA: Diagnosis not present

## 2019-09-28 DIAGNOSIS — S79929A Unspecified injury of unspecified thigh, initial encounter: Secondary | ICD-10-CM | POA: Diagnosis not present

## 2019-09-28 DIAGNOSIS — I5032 Chronic diastolic (congestive) heart failure: Secondary | ICD-10-CM | POA: Diagnosis not present

## 2019-09-28 DIAGNOSIS — E785 Hyperlipidemia, unspecified: Secondary | ICD-10-CM | POA: Diagnosis not present

## 2019-09-28 LAB — GLUCOSE, CAPILLARY
Glucose-Capillary: 122 mg/dL — ABNORMAL HIGH (ref 70–99)
Glucose-Capillary: 155 mg/dL — ABNORMAL HIGH (ref 70–99)
Glucose-Capillary: 117 mg/dL — ABNORMAL HIGH (ref 70–99)

## 2019-09-28 LAB — CBC
HCT: 24.1 % — ABNORMAL LOW (ref 36.0–46.0)
Hemoglobin: 7.9 g/dL — ABNORMAL LOW (ref 12.0–15.0)
MCH: 32.9 pg (ref 26.0–34.0)
MCHC: 32.8 g/dL (ref 30.0–36.0)
MCV: 100.4 fL — ABNORMAL HIGH (ref 80.0–100.0)
Platelets: 202 10*3/uL (ref 150–400)
RBC: 2.4 MIL/uL — ABNORMAL LOW (ref 3.87–5.11)
RDW: 14.3 % (ref 11.5–15.5)
WBC: 5.5 10*3/uL (ref 4.0–10.5)
nRBC: 0 % (ref 0.0–0.2)

## 2019-09-28 MED ORDER — POTASSIUM CHLORIDE CRYS ER 20 MEQ PO TBCR: 20.0000 meq | EXTENDED_RELEASE_TABLET | Freq: Every day | ORAL | Status: DC

## 2019-09-28 MED ORDER — DOCUSATE SODIUM 100 MG PO CAPS: 100.0000 mg | ORAL_CAPSULE | Freq: Two times a day (BID) | ORAL | Status: DC

## 2019-09-28 MED ORDER — LOVASTATIN 40 MG PO TABS: 40.0000 mg | ORAL_TABLET | Freq: Every day | ORAL | Status: DC

## 2019-09-28 MED ORDER — CYANOCOBALAMIN 1000 MCG PO TABS: 1000.0000 ug | ORAL_TABLET | Freq: Every day | ORAL | Status: DC

## 2019-09-28 MED ORDER — ACETAMINOPHEN 500 MG PO TABS
500.0000 mg | ORAL_TABLET | Freq: Four times a day (QID) | ORAL | Status: DC
  Administered 2019-09-28 (×2): 500 mg via ORAL
  Filled 2019-09-28 (×2): qty 1

## 2019-09-28 MED ORDER — FUROSEMIDE 40 MG PO TABS: 40.0000 mg | ORAL_TABLET | Freq: Every day | ORAL | Status: DC

## 2019-09-28 MED ORDER — POLYETHYLENE GLYCOL 3350 17 G PO PACK: 17.0000 g | PACK | Freq: Every day | ORAL | Status: DC | PRN

## 2019-09-28 MED ORDER — HYDROCODONE-ACETAMINOPHEN 5-325 MG PO TABS: 1.0000 | ORAL_TABLET | Freq: Four times a day (QID) | ORAL | 0 refills | Status: DC | PRN

## 2019-09-28 MED ORDER — VITAMIN D-3 125 MCG (5000 UT) PO TABS: 5000.0000 [IU] | ORAL_TABLET | Freq: Every day | ORAL | 1 refills | Status: AC

## 2019-09-28 MED ORDER — ENSURE ENLIVE PO LIQD: 237.0000 mL | Freq: Two times a day (BID) | ORAL | Status: AC

## 2019-09-28 MED ORDER — VITAMIN D-3 125 MCG (5000 UT) PO TABS: 5000.0000 [IU] | ORAL_TABLET | Freq: Every day | ORAL | 1 refills | Status: DC

## 2019-09-28 MED ORDER — CLONIDINE HCL 0.1 MG PO TABS: 0.1000 mg | ORAL_TABLET | Freq: Every day | ORAL | Status: DC

## 2019-09-28 MED ORDER — CLONAZEPAM 0.5 MG PO TABS: 0.2500 mg | ORAL_TABLET | Freq: Every day | ORAL | 0 refills | Status: DC

## 2019-09-28 MED ORDER — AMBULATORY NON FORMULARY MEDICATION: 30.0000 mL | Freq: Four times a day (QID) | Status: DC | PRN

## 2019-09-28 MED ORDER — ACETAMINOPHEN 500 MG PO TABS: 500.0000 mg | ORAL_TABLET | Freq: Four times a day (QID) | ORAL | Status: DC

## 2019-09-28 MED ORDER — ADULT MULTIVITAMIN W/MINERALS CH: 1.0000 | ORAL_TABLET | Freq: Every day | ORAL | Status: AC

## 2019-09-28 MED ORDER — BENAZEPRIL HCL 5 MG PO TABS: 5.0000 mg | ORAL_TABLET | Freq: Every day | ORAL | Status: DC

## 2019-09-28 NOTE — TOC Transition Note (Signed)
Transition of Care [facility]) - CM/SW Discharge Note   Patient Details  Name: Katie [name] MRN: [id number] Date of Birth: 12-Oct-1931  Transition of [facility]) CM/SW Contact:  Atilano Median, LCSW Phone Number: 09/28/2019, 2:42 PM   Clinical Narrative:     Discharged to SNF. Referral coordinated with Elyse Hsu. Patient's son Louie Casa aware and agreeable to this plan. Number to call report [phone] given to unit RN Pamala Hurry. DC summary sent via Epic. No other needs at this time. Case closed to this CSW.   Final next level of care: Skilled Nursing Facility Barriers to Discharge: Barriers Resolved   Patient Goals and CMS Choice Patient states their goals for this hospitalization and ongoing recovery are:: to not be in so much pain, get back home if possible CMS Medicare.gov Compare Post Acute Care list provided to:: Patient Choice offered to / list presented to : Patient  Discharge Placement   Existing PASRR number confirmed : 09/28/19          Patient [facility] bed at: [facility] Patient to be transferred to facility by: [place] Name of family member notified: Louie Casa t Patient and family notified of of transfer: 09/28/19  Discharge Plan and Services     Post Acute Care Choice: [place]                               Social Determinants of Health (SDOH) Interventions     Readmission Risk Interventions No flowsheet data found.

## 2019-09-28 NOTE — Progress Notes (Addendum)
Orthopaedic Trauma Progress Note  S: Continues to have some pain in the left hip.  Was started on scheduled Tylenol earlier today, this seems to have helped some.  Continues to tolerate mobilizing with therapy.  Is hopeful to be discharged soon.  O:  Vitals:   09/28/19 1221 09/28/19 1247  BP: (!) 165/88 (!) 140/53  Pulse:  72  Resp:  17  Temp:  98.9 F (37.2 C)  SpO2:  99%   Gen: Resting comfortably in bed.  NAD, alert and oriented x3 LLE: Incisions clean, dry and intact. Mild bruising.  Tolerates some knee motion.  Tender with palpation of the lateral hip and thigh.  Nontender in the lower leg.  Ankle dorsiflexion and plantarflexion is intact.  Neurovascularly intact distally  Imaging: Stable postop imaging  Labs:  Results for orders placed or performed during the hospital encounter of 09/22/19 (from the past 24 hour(s))  Glucose, capillary     Status: Abnormal   Collection Time: 09/27/19  4:33 PM  Result Value Ref Range   Glucose-Capillary 137 (H) 70 - 99 mg/dL  Glucose, capillary     Status: Abnormal   Collection Time: 09/27/19  8:48 PM  Result Value Ref Range   Glucose-Capillary 134 (H) 70 - 99 mg/dL  CBC     Status: Abnormal   Collection Time: 09/28/19  3:10 AM  Result Value Ref Range   WBC 5.5 4.0 - 10.5 K/uL   RBC 2.40 (L) 3.87 - 5.11 MIL/uL   Hemoglobin 7.9 (L) 12.0 - 15.0 g/dL   HCT 24.1 (L) 36.0 - 46.0 %   MCV 100.4 (H) 80.0 - 100.0 fL   MCH 32.9 26.0 - 34.0 pg   MCHC 32.8 30.0 - 36.0 g/dL   RDW 14.3 11.5 - 15.5 %   Platelets 202 150 - 400 K/uL   nRBC 0.0 0.0 - 0.2 %  Glucose, capillary     Status: Abnormal   Collection Time: 09/28/19  6:50 AM  Result Value Ref Range   Glucose-Capillary 122 (H) 70 - 99 mg/dL  Glucose, capillary     Status: Abnormal   Collection Time: 09/28/19 11:32 AM  Result Value Ref Range   Glucose-Capillary 155 (H) 70 - 99 mg/dL    Assessment: 86 yo female s/p ground level fall  Injuries: Left intertrochanteric femur fracture s/p  IMN 09/23/2019  Weightbearing: WBAT LLE  Insicional and dressing care: Okay to leave incisions open to the air  Orthopedic device(s):None needed  CV/Blood loss: Hgb 7.9 this AM, acute blood loss anemia.  Hemodynamically stable.    Pain management: 1. Norco 1 tab q 6 hours PRN 2. Robaxin 500 mg q 6 hours PRN 3. Morphine 0.5-1 mg q 2 hours 4. Tylenol 500 mg 4 times daily   VTE prophylaxis: Xarelto SCDs: In place bilateral lower extremities  ID: Clindamycin x 24 hrs completed  Foley/Lines: None  Medical co-morbidities: Diabetes-SSI A fib-on Xarelto  Impediments to Fracture Healing: Vit D deficiency, continue vit D3 supplementation  Dispo: Up with therapies as tolerated.  PT/OT recommending SNF. Patient okay to be discharged today from orthopedic standpoint.  Continue home dose Xarelto at discharge D/c rx for pain medication and Vit D3 supplementation printed and placed in chart  Follow - up plan: 2 weeks for repeat x-rays   [name] A. Carmie Kanner [facility] [phone] (office) [email or web address]

## 2019-09-28 NOTE — Progress Notes (Signed)
Physical Therapy Treatment Patient Details Name: Katie [name] MRN: [id number] DOB: Aug 13, 1932 Today's Date: 09/28/2019    History of Present Illness 86 yo female admitted to ED on 2/4 with fall, s/p IM nail L femur on 2/5. CT head and spine negative for acute changes. PMH includes atrial fibrillation and flutter on Xarelto, CKD, history of recent COVID-19 infection, NIDDM, hypertension, hyperlipidemia, anxiety, HF, and osteoporosis    PT Comments    Pt in bed upon PT arrival, agreeable to PT session, but reporting she is still in pain from yesterday's session and that RN staff were unable to control pain with available pain medications last night. The pt continues to present with limitations in functional mobility, power, endurance, and endurance compared to herr prior level of function and independence due to above dx. The pt's course is further limited by sig pain in her surgical leg that limits mobility progression at this time. The pt was able to demo transfers as well as short lateral ambulation today, but remained tearful and pain limited. The pt will continue to benefit from skilled PT to progress mobility and independence prior to return home. The pt remains anxious about pain, syncope, BP, and d/c to SNF with COVID restrictions. She is worried they will not be able to manage her medical care. Time was spend encouraging her to advocate for her concerns with other providers and providers at the SNF in order to comfort her. The pt was also educated in the role of mobility and pain management as she continues to recover and was agreeable to education.     Follow Up Recommendations  Supervision/Assistance - 24 hour;SNF     Equipment Recommendations  Rolling walker with 5" wheels    Recommendations for Other Services       Precautions / Restrictions Precautions Precautions: Fall Precaution Comments: watch BP Restrictions Weight Bearing Restrictions: Yes LLE Weight Bearing: Weight  bearing as tolerated    Mobility  Bed Mobility Overal bed mobility: Needs Assistance Bed Mobility: Sit to Supine;Supine to Sit     Supine to sit: Min guard;Min assist;HOB elevated Sit to supine: Mod assist   General bed mobility comments: sup-sit: minA for LLE only, VCs for hand placement and sequencing. sit-sup: modA for BLE  Transfers Overall transfer level: Needs assistance Equipment used: Rolling walker (2 wheeled) Transfers: Sit to/from Stand Sit to Stand: Min assist         General transfer comment: cues for hand and max encouragement, pt anxious about onset of pain. sig extra time but only minA needed  Ambulation/Gait Ambulation/Gait assistance: Min assist Gait Distance (Feet): 3 Feet Assistive device: Rolling walker (2 wheeled) Gait Pattern/deviations: Step-to pattern;Decreased stance time - left;Decreased stride length;Decreased weight shift to left   Gait velocity interpretation: <1.31 ft/sec, indicative of household ambulator General Gait Details: pt able to stand and take lateral steps to R with minA and RW for safety. No reports of dizziness, minimal wt acceptance on LLE   Stairs             Wheelchair Mobility    Modified Rankin (Stroke Patients Only)       Balance Overall balance assessment: Needs assistance Sitting-balance support: Bilateral upper extremity supported;Feet supported;Single extremity supported Sitting balance-Leahy Scale: Fair Sitting balance - Comments: able to sit EOB without PT support, but must utilize UE support as she is trying to lean to R to off-weight LLE Postural control: Right lateral lean Standing balance support: Bilateral upper extremity supported;During functional activity  Standing balance-Leahy Scale: Poor Standing balance comment: bil UE support in standing                            Cognition Arousal/Alertness: Awake/alert Behavior During Therapy: Anxious;WFL for tasks assessed/performed(sig  anxiety, redirectable. multiple pauses in treatment for guided relaxation and encouragement) Overall Cognitive Status: Within Functional Limits for tasks assessed                                 General Comments: pt very anxious regarding pain, syncope/BP, low lab values ("my son said there is something wrong with my blood"), and d/c to SNF      Exercises General Exercises - Lower Extremity Long Arc Quad: AROM;AAROM;Right;Left;Seated;10 reps(AAROM LLE) Heel Raises: AROM;Both;20 reps;Seated    General Comments General comments (skin integrity, edema, etc.): BP: 165/88 (106) supine in bed with HOB 45 deg; 154/83 (115) HR 92 sitting EOB; 128/104 (112) HR 132 sitting EOB after standing . RN notified and present to give BP medication.      Pertinent Vitals/Pain Pain Assessment: Faces Faces Pain Scale: Hurts even more Pain Location: L LE  with mobility Pain Descriptors / Indicators: Grimacing;Guarding;Moaning;Sore;Crying Pain Intervention(s): Limited activity within patient's tolerance;Monitored during session;Repositioned;Relaxation;Premedicated before session;Utilized relaxation techniques    Home Living                      Prior Function            PT Goals (current goals can now be found in the care plan section) Acute Rehab PT Goals Patient Stated Goal: go home PT Goal Formulation: With patient Time For Goal Achievement: 10/07/19 Potential to Achieve Goals: Good Progress towards PT goals: Progressing toward goals    Frequency    Min 3X/week      PT Plan Current plan remains appropriate    Co-evaluation              AM-PAC PT "6 Clicks" Mobility   Outcome Measure  Help needed turning from your back to your side while in a flat bed without using bedrails?: A Lot Help needed moving from lying on your back to sitting on the side of a flat bed without using bedrails?: A Lot Help needed moving to and from a bed to a chair (including a  wheelchair)?: A Little Help needed standing up from a chair using your arms (e.g., wheelchair or bedside chair)?: A Little Help needed to walk in hospital room?: A Lot Help needed climbing 3-5 steps with a railing? : Total 6 Click Score: 13    End of Session Equipment Utilized During Treatment: Gait belt Activity Tolerance: Patient limited by pain Patient left: with call bell/phone within reach;with nursing/sitter in room;in bed Nurse Communication: Mobility status;Precautions;Weight bearing status PT Visit Diagnosis: Other abnormalities of gait and mobility (R26.89);Muscle weakness (generalized) (M62.81);Pain Pain - Right/Left: Left Pain - part of body: Leg;Hip     Time: [id number] PT Time Calculation (min) (ACUTE ONLY): 45 min  Charges:  $Gait Training: 8-22 mins $Therapeutic Exercise: 8-22 mins $Therapeutic Activity: 8-22 mins                     Karma Ganja, PT, DPT   Acute Rehabilitation Department Pager #: [phone]   Otho Bellows 09/28/2019, 12:39 PM

## 2019-09-28 NOTE — Discharge Summary (Signed)
Physician Discharge Summary  Katie [name] [id number] DOB: 08-30-1931  PCP: Katie Pretty, FNP  Admitted from: Home Discharged to: SNF  Admit date: 09/22/2019 Discharge date: 09/28/2019  Recommendations for Outpatient Follow-up:    Contact information for follow-up providers    [name], Katie Lot, MD. Schedule an appointment as soon as possible for a visit in 2 week(s).   Specialty: Orthopedic Surgery Why: repeat x-rays left hip Contact information: [place] [address] [phone]        MD at SNF. Schedule an appointment as soon as possible for a visit.   Why: To be seen in 2 to 3 days with repeat labs (CBC & BMP).       Katie Pretty, FNP. Schedule an appointment as soon as possible for a visit.   Specialty: Family Medicine Why: Upon discharge from SNF. Contact information: [place] [address] [phone]        Katie Blanks, MD .   Specialty: Cardiology Contact information: [place] [place] [address] [phone]            Contact information for after-discharge care    Destination    [facility] .   Service: Skilled Nursing Contact information: [address] [place]: N/A Equipment/Devices: TBD at SNF  Discharge Condition: Improved and stable. CODE STATUS: Full Diet recommendation: Heart healthy & diabetic diet (liquids and soft diet).  Discharge Diagnoses:  Principal Problem:   Displaced intertrochanteric fracture of left femur, initial encounter for closed fracture ([place]) Active Problems:   Hypertension   Hyperlipidemia   Diabetes mellitus type 2, diet-controlled (HCC)   GERD (gastroesophageal reflux disease)   Hypothyroidism   CKD (chronic kidney disease) stage 3, GFR 30-59 ml/min   Persistent atrial fibrillation (HCC)    Chronic diastolic CHF (congestive heart failure) (HCC)   Brief Summary: 86 year old female with PMH of diet-controlled DM 2, hypothyroid, hypertension, hyperlipidemia, stage III chronic kidney disease, chronic diastolic CHF, A. fib on Xarelto, hard of hearing, presented to ED following a fall.  She reportedly had gone out to get her hair done, went out to start her car to warm it up, when she got out, apparently slipped on gravel and slid onto the ground.  She was unable to get up.  She had her phone and called her niece and they called 41.  She reportedly was very cold, lying on the ground.  She indicated to admitting MD that she has had a very hard time since her husband died in November 05, 2018.  She had dysphagia and was admitted at the hospital in [place] from 12/16-12/19 with GERD, was found to have COVID-19 infection and an E. coli UTI.  She had an EGD with Dr. Carlean Purl on 1/26 and was found to have dysmotility/presbyesophagus and a grade 3 gastroesophageal flap.  As per patient and son, patient has been on liquids/soft diet since December.   Assessment and plan:  1. Left hip fracture (intertrochanteric femur fracture): Sustained after mechanical fall.  Orthopedics consulted and patient underwent IM nail with Dr. [name] on 09/23/2019.  As per orthopedic follow-up today, weightbearing as tolerated on left lower extremity, okay to leave surgical incisions open to air, pain better controlled after initiation of scheduled acetaminophen this morning.  They have cleared her  for discharge to SNF with outpatient follow-up pain 2 weeks.  Continue prior home dose of Xarelto which will cover for her postop VTE prophylaxis. 2. Postop acute blood loss anemia/macrocytic anemia: Hemoglobin has been stable in the high 7 g range for the last 3 to 4 days without any overt bleeding.  B12 supplements started for low B12 levels.  FOBT negative.  S/p 1 unit PRBC transfusion on 09/25/2019.  Follow CBC biweekly at SNF and transfuse if  hemoglobin 7 g or less. 3. Thrombocytopenia: Likely secondary to acute blood loss.  Resolved. 4. Paroxysmal atrial fibrillation: History of ablation of atrial flutter by Dr. Rayann Heman.  Clinically appears to be in sinus rhythm.  Continue Xarelto for secondary stroke prophylaxis. 5. Chronic diastolic CHF: Diuretics were temporarily held in the hospital.  Now that she is slowly improving, resumed Lasix and potassium supplements at reduced dose.  Monitor closely as outpatient and adjust doses as needed. 6. Diet-controlled DM 2: Reasonable inpatient control and has not required much SSI.  History of Metformin intolerance.  Monitor CBGs periodically at SNF and outpatient follow-up with PCP. 7. Essential hypertension: Antihypertensive doses were reduced, reasonable inpatient control. 8. Stage III CKD: Creatinine at baseline.  Follow BMP periodically at SNF. 9. Hyperlipidemia: Continue statins. 10. GERD: Continue Protonix. 11. Esophageal dysmotility: Patient and son report that patient has been having trouble swallowing since she got sick from the Covid in 07/2019.  Reportedly lost 20 pounds weight since then.  Followed by Dr. Carlean Purl, [facility].  Per her preference, continue liquid diet and soft food.  Close outpatient follow-up. 12. Hypothyroid: TSH 2.1.  Continue Synthroid.   Consultations:  Orthopedic  Procedures:  Left hip fracture repair with IM nail by Dr. [name] on 09/23/2019.   Discharge Instructions  Discharge Instructions    (HEART FAILURE PATIENTS) Call MD:  Anytime you have any of the following symptoms: 1) 3 pound weight gain in 24 hours or 5 pounds in 1 week 2) shortness of breath, with or without a dry hacking cough 3) swelling in the hands, feet or stomach 4) if you have to sleep on extra pillows at night in order to breathe.   Complete by: As directed    Call MD for:  difficulty breathing, headache or visual disturbances   Complete by: As directed    Call MD for:  extreme fatigue    Complete by: As directed    Call MD for:  persistant dizziness or light-headedness   Complete by: As directed    Call MD for:  persistant nausea and vomiting   Complete by: As directed    Call MD for:  redness, tenderness, or signs of infection (pain, swelling, redness, odor or green/yellow discharge around incision site)   Complete by: As directed    Call MD for:  severe uncontrolled pain   Complete by: As directed    Call MD for:  temperature >100.4   Complete by: As directed    Diet - low sodium heart healthy   Complete by: As directed    Liquids and soft diet.   Increase activity slowly   Complete by: As directed        Medication List    STOP taking these medications   traMADol 50 MG tablet Commonly known as: ULTRAM     TAKE these medications   acetaminophen 500 MG tablet Commonly known as: TYLENOL Take 1 tablet (500 mg total) by mouth 4 (four) times daily.   albuterol 108 (90  Base) MCG/ACT inhaler Commonly known as: VENTOLIN HFA Inhale 2 puffs into the lungs every 6 (six) hours as needed for wheezing or shortness of breath.   AMBULATORY NON FORMULARY MEDICATION Swish and swallow 30 mLs 4 (four) times daily as needed (mouth pain). GI cocktail 90 ml viscous lidocaine, 93ml-10mg /13ml dicyclomine, 277ml maalox Sig: Swish and swallow 15ml's by mouth four times daily   benazepril 5 MG tablet Commonly known as: LOTENSIN Take 1 tablet (5 mg total) by mouth daily. Start taking on: September 29, 2019 What changed:   medication strength  how much to take   clonazePAM 0.5 MG tablet Commonly known as: KLONOPIN Take 0.5 tablets (0.25 mg total) by mouth at bedtime.   cloNIDine 0.1 MG tablet Commonly known as: CATAPRES Take 1 tablet (0.1 mg total) by mouth at bedtime. What changed: when to take this   cyanocobalamin 1000 MCG tablet Take 1 tablet (1,000 mcg total) by mouth daily. Start taking on: September 29, 2019   docusate sodium 100 MG capsule Commonly known  as: COLACE Take 1 capsule (100 mg total) by mouth 2 (two) times daily.   feeding supplement (ENSURE ENLIVE) Liqd Take 237 mLs by mouth 2 (two) times daily between meals.   fluticasone 50 MCG/ACT nasal spray Commonly known as: FLONASE Place 2 sprays into both nostrils daily.   furosemide 40 MG tablet Commonly known as: LASIX Take 1 tablet (40 mg total) by mouth daily. Start taking on: September 29, 2019 What changed:   how much to take  how to take this  when to take this  additional instructions   HYDROcodone-acetaminophen 5-325 MG tablet Commonly known as: NORCO/VICODIN Take 1 tablet by mouth every 6 (six) hours as needed for severe pain.   levothyroxine 50 MCG tablet Commonly known as: SYNTHROID TAKE 1 TABLET BY MOUTH  DAILY BEFORE BREAKFAST What changed:   how much to take  how to take this  when to take this  additional instructions   loratadine 10 MG tablet Commonly known as: CLARITIN Take 10 mg by mouth daily as needed for allergies.   lovastatin 40 MG tablet Commonly known as: MEVACOR Take 1 tablet (40 mg total) by mouth at bedtime.   multivitamin with minerals Tabs tablet Take 1 tablet by mouth daily. Start taking on: September 29, 2019   pantoprazole 40 MG tablet Commonly known as: PROTONIX Take 1 tablet (40 mg total) by mouth daily.   polyethylene glycol 17 g packet Commonly known as: MIRALAX / GLYCOLAX Take 17 g by mouth daily as needed for mild constipation.   potassium chloride SA 20 MEQ tablet Commonly known as: KLOR-CON Take 1 tablet (20 mEq total) by mouth daily. What changed: when to take this   raloxifene 60 MG tablet Commonly known as: EVISTA Take 1 tablet (60 mg total) by mouth daily.   Rivaroxaban 15 MG Tabs tablet Commonly known as: XARELTO Take 1 tablet (15 mg total) by mouth daily with supper.   Vitamin D-3 125 MCG (5000 UT) Tabs Take 5,000 Units by mouth daily.      Allergies  Allergen Reactions  . Amlodipine  Swelling  . Macrodantin Nausea And Vomiting  . Metformin And Related Nausea And Vomiting and Other (See Comments)    Bloating  . Penicillins Rash    Did it involve swelling of the face/tongue/throat, SOB, or low BP? Unknown Did it involve sudden or severe rash/hives, skin peeling, or any reaction on the inside of your mouth or nose? Yes Did  you need to seek medical attention at a hospital or doctor's office? Yes When did it last happen? 2005  If all above answers are "NO", may proceed with cephalosporin use.       Procedures/Studies: DG Chest 1 View  Result Date: 09/22/2019 CLINICAL DATA:  Hip fracture. EXAM: CHEST  1 VIEW COMPARISON:  None. FINDINGS: The heart size and mediastinal contours are within normal limits. Both lungs are clear. The visualized skeletal structures are unremarkable. IMPRESSION: No active disease. Electronically Signed   By: Marijo Conception M.D.   On: 09/22/2019 11:14   CT HEAD WO CONTRAST  Result Date: 09/22/2019 CLINICAL DATA:  Tripped and fell, left hip injury, anticoagulated for atrial fibrillation EXAM: CT HEAD WITHOUT CONTRAST TECHNIQUE: Contiguous axial images were obtained from the base of the skull through the vertex without intravenous contrast. COMPARISON:  None. FINDINGS: Brain: No acute infarct or hemorrhage. Lateral ventricles and midline structures are unremarkable. No acute extra-axial fluid collections. No mass effect. Vascular: No hyperdense vessel or unexpected calcification. Skull: Normal. Negative for fracture or focal lesion. Sinuses/Orbits: No acute finding. Other: None IMPRESSION: 1. No acute intracranial process.  No evidence of trauma. Electronically Signed   By: Randa Ngo M.D.   On: 09/22/2019 11:38   CT CERVICAL SPINE WO CONTRAST  Result Date: 09/22/2019 CLINICAL DATA:  [place] this morning, anticoagulated, left hip injury EXAM: CT CERVICAL SPINE WITHOUT CONTRAST TECHNIQUE: Multidetector CT imaging of the cervical spine was performed  without intravenous contrast. Multiplanar CT image reconstructions were also generated. COMPARISON:  None. FINDINGS: Alignment: There is mild levoscoliosis of the cervical spine. Otherwise alignment is anatomic. Skull base and vertebrae: There are no acute displaced cervical spine fractures. Soft tissues and spinal canal: There is extensive atherosclerosis within the coronary vasculature. Prevertebral soft tissues are unremarkable. Airways patent. Disc levels: Multilevel cervical spondylosis is identified. Mild circumferential disc osteophyte complex is seen from C3-4 through C6-7. There is a right predominant neural foraminal encroachment at these levels. Bony fusion across the bilateral facet joints at C2/C3. Upper chest: Lung apices are clear. Other: Reconstructed images demonstrate no additional findings. IMPRESSION: 1. No acute cervical spine fracture. 2. Multilevel cervical spondylosis. 3. Prominent atherosclerosis of the carotid bifurcations. Electronically Signed   By: Randa Ngo M.D.   On: 09/22/2019 11:42   DG C-Arm 1-60 Min  Result Date: 09/23/2019 CLINICAL DATA:  Intertrochanteric left hip fracture EXAM: LEFT FEMUR 2 VIEWS; DG C-ARM 1-60 MIN COMPARISON:  09/22/2019 FINDINGS: Eight fluoroscopic images are obtained during the performance of the procedure and are provided for interpretation only. Comminuted intertrochanteric left hip fracture is identified. Placement of an intramedullary rod with proximal dynamic screws and distal interlocking screw. Alignment is near anatomic. FLUOROSCOPY TIME:  1 minutes 52 seconds IMPRESSION: ORIF left hip fracture. Electronically Signed   By: Randa Ngo M.D.   On: 09/23/2019 12:08   DG Hip Unilat With Pelvis 2-3 Views Left  Result Date: 09/22/2019 CLINICAL DATA:  Left hip pain after fall. EXAM: DG HIP (WITH OR WITHOUT PELVIS) 2-3V LEFT COMPARISON:  None. FINDINGS: Severely displaced and comminuted fracture is seen involving the intertrochanteric region of  the proximal left femur. Vascular calcifications are noted. IMPRESSION: Severely displaced and comminuted intertrochanteric fracture of proximal left femur. Electronically Signed   By: Marijo Conception M.D.   On: 09/22/2019 11:13   DG FEMUR MIN 2 VIEWS LEFT  Result Date: 09/23/2019 CLINICAL DATA:  Intertrochanteric left hip fracture EXAM: LEFT FEMUR 2 VIEWS; DG  C-ARM 1-60 MIN COMPARISON:  09/22/2019 FINDINGS: Eight fluoroscopic images are obtained during the performance of the procedure and are provided for interpretation only. Comminuted intertrochanteric left hip fracture is identified. Placement of an intramedullary rod with proximal dynamic screws and distal interlocking screw. Alignment is near anatomic. FLUOROSCOPY TIME:  1 minutes 52 seconds IMPRESSION: ORIF left hip fracture. Electronically Signed   By: Randa Ngo M.D.   On: 09/23/2019 12:08   DG FEMUR PORT MIN 2 VIEWS LEFT  Result Date: 09/23/2019 CLINICAL DATA:  ORIF left hip fracture EXAM: LEFT FEMUR PORTABLE 2 VIEWS COMPARISON:  09/22/2019 FINDINGS: Frontal and lateral views of the left femur are obtained. Intramedullary rod with proximal dynamic screws and a distal interlocking screw spans a comminuted proximal left femoral fracture. Alignment is anatomic. Postsurgical changes are seen in the soft tissues. IMPRESSION: 1. ORIF proximal left femoral fracture. Electronically Signed   By: Randa Ngo M.D.   On: 09/23/2019 13:23      Subjective: Patient interviewed and examined along with her female RN in the room.  Had just finished working with PT.  Reported significant left hip postop pain which was better when she was laying in bed prior to therapies.  Also concerned about her anemia-discussed this in detail with her and her son.  Per subsequent report, pain better controlled after scheduled Tylenol.  No chest pain or dyspnea reported.  Discharge Exam:  Vitals:   09/28/19 0408 09/28/19 0737 09/28/19 1221 09/28/19 1247  BP: (!)  121/50 (!) 136/59 (!) 165/88 (!) 140/53  Pulse: 65 64  72  Resp: 15 15  17   Temp: 98.2 F (36.8 C) 98.3 F (36.8 C)  98.9 F (37.2 C)  TempSrc: Oral Oral  Oral  SpO2: 95% 100%  99%  Weight:      Height:        General: Pleasant elderly female, moderately built and nourished, lying propped up in bed in some discomfort this morning. Cardiovascular: S1 & S2 heard, RRR, S1/S2 +. No murmurs, rubs, gallops or clicks. No JVD.  Trace bilateral ankle edema. Respiratory: Clear to auscultation without wheezing, rhonchi or crackles. No increased work of breathing. Abdominal:  Non distended, non tender & soft. No organomegaly or masses appreciated. Normal bowel sounds heard. CNS: Alert and oriented. No focal deficits.  Extremely hard of hearing. Extremities: no edema, no cyanosis.  Restricted left lower extremity movement secondary to postop pain.  Surgical incision sites at left hip and left lower lateral thigh without acute findings.    The results of significant diagnostics from this hospitalization (including imaging, microbiology, ancillary and laboratory) are listed below for reference.     Microbiology: Recent Results (from the past 240 hour(s))  Respiratory Panel by RT PCR (Flu A&B, Covid) - Nasopharyngeal Swab     Status: None   Collection Time: 09/22/19 11:32 AM   Specimen: Nasopharyngeal Swab  Result Value Ref Range Status   SARS Coronavirus 2 by RT PCR NEGATIVE NEGATIVE Final    Comment: (NOTE) SARS-CoV-2 target nucleic acids are NOT DETECTED. The SARS-CoV-2 RNA is generally detectable in upper respiratoy specimens during the acute phase of infection. The lowest concentration of SARS-CoV-2 viral copies this assay can detect is 131 copies/mL. A negative result does not preclude SARS-Cov-2 infection and should not be used as the sole basis for treatment or other patient management decisions. A negative result may occur with  improper specimen collection/handling, submission of  specimen other than nasopharyngeal swab, presence of viral mutation(s)  within the areas targeted by this assay, and inadequate number of viral copies (<131 copies/mL). A negative result must be combined with clinical observations, patient history, and epidemiological information. The expected result is Negative. Fact Sheet for Patients:  PinkCheek.be Fact Sheet for Healthcare Providers:  GravelBags.it This test is not yet ap proved or cleared by the [place] FDA and  has been authorized for detection and/or diagnosis of SARS-CoV-2 by FDA under an Emergency Use Authorization (EUA). This EUA will remain  in effect (meaning this test can be used) for the duration of the COVID-19 declaration under Section 564(b)(1) of the Act, 21 U.S.C. section 360bbb-3(b)(1), unless the authorization is terminated or revoked sooner.    Influenza A by PCR NEGATIVE NEGATIVE Final   Influenza B by PCR NEGATIVE NEGATIVE Final    Comment: (NOTE) The Xpert Xpress SARS-CoV-2/FLU/RSV assay is intended as an aid in  the diagnosis of influenza from Nasopharyngeal swab specimens and  should not be used as a sole basis for treatment. Nasal washings and  aspirates are unacceptable for Xpert Xpress SARS-CoV-2/FLU/RSV  testing. Fact Sheet for Patients: PinkCheek.be Fact Sheet for Healthcare Providers: GravelBags.it This test is not yet approved or cleared by the [place] FDA and  has been authorized for detection and/or diagnosis of SARS-CoV-2 by  FDA under an Emergency Use Authorization (EUA). This EUA will remain  in effect (meaning this test can be used) for the duration of the  Covid-19 declaration under Section 564(b)(1) of the Act, 21  U.S.C. section 360bbb-3(b)(1), unless the authorization is  terminated or revoked. Performed at [facility], [place] [address], [place],  [place] [address]   Surgical pcr screen     Status: None   Collection Time: 09/22/19  5:32 PM   Specimen: Nasal Mucosa; Nasal Swab  Result Value Ref Range Status   MRSA, PCR NEGATIVE NEGATIVE Final   Staphylococcus aureus NEGATIVE NEGATIVE Final    Comment: (NOTE) The Xpert SA Assay (FDA approved for NASAL specimens in patients 86 years of age and older), is one component of a comprehensive surveillance program. It is not intended to diagnose infection nor to guide or monitor treatment. Performed at [facility], [place] [address] [place], [place] [address]   Culture, Urine     Status: Abnormal   Collection Time: 09/23/19  5:27 PM   Specimen: Urine, Random  Result Value Ref Range Status   Specimen Description URINE, RANDOM  Final   Special Requests   Final    NONE Performed at [facility], [place] [address] [place], [place] [address]    Culture MULTIPLE SPECIES PRESENT, SUGGEST RECOLLECTION (A)  Final   Report Status 09/24/2019 FINAL  Final  SARS CORONAVIRUS 2 (TAT 6-24 HRS) Nasopharyngeal Nasopharyngeal Swab     Status: None   Collection Time: 09/25/19  7:14 PM   Specimen: Nasopharyngeal Swab  Result Value Ref Range Status   SARS Coronavirus 2 NEGATIVE NEGATIVE Final    Comment: (NOTE) SARS-CoV-2 target nucleic acids are NOT DETECTED. The SARS-CoV-2 RNA is generally detectable in upper and lower respiratory specimens during the acute phase of infection. Negative results do not preclude SARS-CoV-2 infection, do not rule out co-infections with other pathogens, and should not be used as the sole basis for treatment or other patient management decisions. Negative results must be combined with clinical observations, patient history, and epidemiological information. The expected result is Negative. Fact Sheet for Patients: SugarRoll.be Fact Sheet for Healthcare Providers:  https://www.woods-mathews.com/ This test is not yet approved or  cleared by the [place] and  has been authorized for detection and/or diagnosis of SARS-CoV-2 by FDA under an Emergency Use Authorization (EUA). This EUA will remain  in effect (meaning this test can be used) for the duration of the COVID-19 declaration under Section 56 4(b)(1) of the Act, 21 U.S.C. section 360bbb-3(b)(1), unless the authorization is terminated or revoked sooner. Performed at [facility], [place] [address] [place], [place] [address]   SARS CORONAVIRUS 2 (TAT 6-24 HRS) Nasopharyngeal Nasopharyngeal Swab     Status: None   Collection Time: 09/27/19  9:46 AM   Specimen: Nasopharyngeal Swab  Result Value Ref Range Status   SARS Coronavirus 2 NEGATIVE NEGATIVE Final    Comment: (NOTE) SARS-CoV-2 target nucleic acids are NOT DETECTED. The SARS-CoV-2 RNA is generally detectable in upper and lower respiratory specimens during the acute phase of infection. Negative results do not preclude SARS-CoV-2 infection, do not rule out co-infections with other pathogens, and should not be used as the sole basis for treatment or other patient management decisions. Negative results must be combined with clinical observations, patient history, and epidemiological information. The expected result is Negative. Fact Sheet for Patients: SugarRoll.be Fact Sheet for Healthcare Providers: https://www.woods-mathews.com/ This test is not yet approved or cleared by the [place] FDA and  has been authorized for detection and/or diagnosis of SARS-CoV-2 by FDA under an Emergency Use Authorization (EUA). This EUA will remain  in effect (meaning this test can be used) for the duration of the COVID-19 declaration under Section 56 4(b)(1) of the Act, 21 U.S.C. section 360bbb-3(b)(1), unless the authorization is terminated or revoked sooner. Performed at [facility], [place] [address], [place], [place] [address]      Labs: CBC: Recent  Labs  Lab 09/22/19 1035 09/23/19 0240 09/25/19 0401 09/25/19 1548 09/26/19 0443 09/27/19 0336 09/28/19 0310  WBC 7.8   < > 6.0 5.9 6.0 5.5 5.5  NEUTROABS 5.6  --   --   --   --   --   --   HGB 11.4*   < > 6.6* 7.9* 7.6* 8.0* 7.9*  HCT 34.7*   < > 19.5* 23.0* 23.1* 24.1* 24.1*  MCV 101.5*   < > 98.5 95.4 97.9 100.0 100.4*  PLT 159   < > 128* 134* 146* 181 202   < > = values in this interval not displayed.    Basic Metabolic Panel: Recent Labs  Lab 09/23/19 0240 09/24/19 0731 09/25/19 0401 09/26/19 0443 09/27/19 0336  NA 140 137 136 139 140  K 4.1 4.3 4.0 4.1 4.6  CL 100 102 102 105 105  CO2 27 26 24 27 28   GLUCOSE 130* 118* 99 125* 137*  BUN 15 18 19 15 13   CREATININE 1.45* 1.28* 1.34* 1.14* 1.06*  CALCIUM 8.8* 8.5* 8.1* 8.1* 8.3*    Liver Function Tests: Recent Labs  Lab 09/22/19 1035 09/26/19 0443  AST 22 16  ALT 13 14  ALKPHOS 62 45  BILITOT 0.7 0.7  PROT 6.0* 4.3*  ALBUMIN 3.5 2.2*    CBG: Recent Labs  Lab 09/27/19 1150 09/27/19 1633 09/27/19 2048 09/28/19 0650 09/28/19 1132  GLUCAP 167* 137* 134* 122* 155*    Urinalysis    Component Value Date/Time   COLORURINE YELLOW 09/23/2019 1740   APPEARANCEUR CLEAR 09/23/2019 1740   APPEARANCEUR Clear 11/13/2015 1156   LABSPEC 1.017 09/23/2019 1740   PHURINE 5.0 09/23/2019  [place] 09/23/2019 [place] 09/23/2019 1740   BILIRUBINUR NEGATIVE 09/23/2019 1740   BILIRUBINUR Positive (A) 11/13/2015 1156   KETONESUR 5 (A) 09/23/2019 1740   PROTEINUR NEGATIVE 09/23/2019 1740   UROBILINOGEN negative 10/12/2013 0934   NITRITE NEGATIVE 09/23/2019 1740   LEUKOCYTESUR NEGATIVE 09/23/2019 1740    I discussed in detail with patient's son via phone, updated care and answered all questions.  Time coordinating discharge: 40 minutes  SIGNED:  Vernell Leep, MD, [place], [facility]. [facility]  To contact the attending provider between 7A-7P or the covering provider during  after hours 7P-7A, please log into the web site [email or web address] and access using universal [place] password for that web site. If you do not have the password, please call the hospital operator.

## 2019-09-28 NOTE — Progress Notes (Addendum)
Provided report to Deberah Castle, RN at [facility] facility at [phone]

## 2019-09-28 NOTE — Progress Notes (Signed)
Patient Son Katie [name] has concerns about patient being discharged to [facility] with hgb 7.9. Dolores Hoose, MD to update son on plan of care. Son cell number provided to MD.

## 2019-09-29 ENCOUNTER — Ambulatory Visit: Payer: Medicare Other | Admitting: Specialist

## 2019-10-04 ENCOUNTER — Ambulatory Visit: Payer: Self-pay | Admitting: Nurse Practitioner

## 2019-10-05 DIAGNOSIS — I130 Hypertensive heart and chronic kidney disease with heart failure and stage 1 through stage 4 chronic kidney disease, or unspecified chronic kidney disease: Secondary | ICD-10-CM | POA: Diagnosis not present

## 2019-10-05 DIAGNOSIS — S72142D Displaced intertrochanteric fracture of left femur, subsequent encounter for closed fracture with routine healing: Secondary | ICD-10-CM | POA: Diagnosis not present

## 2019-10-05 DIAGNOSIS — R42 Dizziness and giddiness: Secondary | ICD-10-CM | POA: Diagnosis not present

## 2019-10-10 ENCOUNTER — Telehealth: Payer: Self-pay

## 2019-10-10 NOTE — Telephone Encounter (Signed)
Patients son Louie Casa called, he states that patient has a defibrillator and is being monitored every 30 days. He states patient was in the hospital for about 1 week 2 weeks ago, and now patient is in a rehab facility for at least another week, then going to her sons house. Do they need to take the monitor to the sons house? Please call Randy back.

## 2019-10-10 NOTE — Telephone Encounter (Signed)
Spoke with pt. Son Katie [name], advise dnxt remote check is due on 3/4, bedside monitor would need to be wherever pt is in order for transmission to come through

## 2019-10-11 DIAGNOSIS — S72142D Displaced intertrochanteric fracture of left femur, subsequent encounter for closed fracture with routine healing: Secondary | ICD-10-CM | POA: Diagnosis not present

## 2019-10-12 DIAGNOSIS — R42 Dizziness and giddiness: Secondary | ICD-10-CM | POA: Diagnosis not present

## 2019-10-12 DIAGNOSIS — E785 Hyperlipidemia, unspecified: Secondary | ICD-10-CM | POA: Diagnosis not present

## 2019-10-12 DIAGNOSIS — I130 Hypertensive heart and chronic kidney disease with heart failure and stage 1 through stage 4 chronic kidney disease, or unspecified chronic kidney disease: Secondary | ICD-10-CM | POA: Diagnosis not present

## 2019-10-12 DIAGNOSIS — S72142D Displaced intertrochanteric fracture of left femur, subsequent encounter for closed fracture with routine healing: Secondary | ICD-10-CM | POA: Diagnosis not present

## 2019-10-17 ENCOUNTER — Telehealth: Payer: Self-pay | Admitting: *Deleted

## 2019-10-17 DIAGNOSIS — J302 Other seasonal allergic rhinitis: Secondary | ICD-10-CM | POA: Diagnosis not present

## 2019-10-17 DIAGNOSIS — E1151 Type 2 diabetes mellitus with diabetic peripheral angiopathy without gangrene: Secondary | ICD-10-CM | POA: Diagnosis not present

## 2019-10-17 DIAGNOSIS — E039 Hypothyroidism, unspecified: Secondary | ICD-10-CM | POA: Diagnosis not present

## 2019-10-17 DIAGNOSIS — Z8616 Personal history of COVID-19: Secondary | ICD-10-CM | POA: Diagnosis not present

## 2019-10-17 DIAGNOSIS — D509 Iron deficiency anemia, unspecified: Secondary | ICD-10-CM | POA: Diagnosis not present

## 2019-10-17 DIAGNOSIS — F5101 Primary insomnia: Secondary | ICD-10-CM | POA: Diagnosis not present

## 2019-10-17 DIAGNOSIS — E538 Deficiency of other specified B group vitamins: Secondary | ICD-10-CM | POA: Diagnosis not present

## 2019-10-17 DIAGNOSIS — E785 Hyperlipidemia, unspecified: Secondary | ICD-10-CM | POA: Diagnosis not present

## 2019-10-17 DIAGNOSIS — I5032 Chronic diastolic (congestive) heart failure: Secondary | ICD-10-CM | POA: Diagnosis not present

## 2019-10-17 DIAGNOSIS — R1314 Dysphagia, pharyngoesophageal phase: Secondary | ICD-10-CM | POA: Diagnosis not present

## 2019-10-17 DIAGNOSIS — K5900 Constipation, unspecified: Secondary | ICD-10-CM | POA: Diagnosis not present

## 2019-10-17 DIAGNOSIS — I4819 Other persistent atrial fibrillation: Secondary | ICD-10-CM | POA: Diagnosis not present

## 2019-10-17 DIAGNOSIS — Z79891 Long term (current) use of opiate analgesic: Secondary | ICD-10-CM | POA: Diagnosis not present

## 2019-10-17 DIAGNOSIS — K219 Gastro-esophageal reflux disease without esophagitis: Secondary | ICD-10-CM | POA: Diagnosis not present

## 2019-10-17 DIAGNOSIS — K224 Dyskinesia of esophagus: Secondary | ICD-10-CM | POA: Diagnosis not present

## 2019-10-17 DIAGNOSIS — Z03818 Encounter for observation for suspected exposure to other biological agents ruled out: Secondary | ICD-10-CM | POA: Diagnosis not present

## 2019-10-17 DIAGNOSIS — Z7901 Long term (current) use of anticoagulants: Secondary | ICD-10-CM | POA: Diagnosis not present

## 2019-10-17 DIAGNOSIS — I130 Hypertensive heart and chronic kidney disease with heart failure and stage 1 through stage 4 chronic kidney disease, or unspecified chronic kidney disease: Secondary | ICD-10-CM | POA: Diagnosis not present

## 2019-10-17 DIAGNOSIS — E1122 Type 2 diabetes mellitus with diabetic chronic kidney disease: Secondary | ICD-10-CM | POA: Diagnosis not present

## 2019-10-17 DIAGNOSIS — M80052D Age-related osteoporosis with current pathological fracture, left femur, subsequent encounter for fracture with routine healing: Secondary | ICD-10-CM | POA: Diagnosis not present

## 2019-10-17 DIAGNOSIS — G8929 Other chronic pain: Secondary | ICD-10-CM | POA: Diagnosis not present

## 2019-10-17 DIAGNOSIS — N1832 Chronic kidney disease, stage 3b: Secondary | ICD-10-CM | POA: Diagnosis not present

## 2019-10-17 NOTE — Telephone Encounter (Signed)
Transitional Care Management  Contact Attempt Attempt Date:10/17/2019 Attempted By:   1st unsuccessful TCM contact attempt.   I reached out to Katie [name] on her preferred telephone number to discuss Transitional Care Management, medication reconciliation, and to schedule a TCM hospital follow-up with her PCP at [facility].  Discharge Date: 10/16/19  Location: from skilled to HOME  Discharge Dx: post-rehab for displaced intertrochanteric fracture of Left Femur.  Recommendations for Outpatient Follow-up:  <14 from Skilled discharge = pt needs follow up with PCP, Ronnald Collum, [place]    Physician Discharge Summary    Katie [name] [id number] DOB: June 28, 1932     PCP: [name], FNP     Admitted from: Home  Discharged to: SNF     Admit date: 09/22/2019  Discharge date: 09/28/2019     Recommendations for Outpatient Follow-up:                 Contact information for follow-up providers                 [name], Katie Lot, MD. Schedule an appointment as soon as possible for a visit in 2 week(s).     Specialty: Orthopedic Surgery  Why: repeat x-rays left hip  Contact information:  [place] [address]  [phone]                              MD at SNF. Schedule an appointment as soon as possible for a visit.     Why: To be seen in 2 to 3 days with repeat labs (CBC & BMP).                           [name], FNP. Schedule an appointment as soon as possible for a visit.     Specialty: Family Medicine  Why: Upon discharge from SNF.  Contact information:  [place] [address]  [phone]                              [name], MD .     Specialty: Cardiology  Contact information:  [address]  [place] [address]  [phone]                                        Contact information for after-discharge care                     Destination                 [facility] .     Service: Skilled Nursing  Contact information:  [address] [place]  [place]: N/A  Equipment/Devices: TBD at SNF     Discharge Condition: Improved and stable.  CODE STATUS: Full  Diet recommendation: Heart healthy & diabetic  diet (liquids and soft diet).     Discharge Diagnoses:   Principal Problem:    Displaced intertrochanteric fracture of left femur, initial encounter for closed fracture ([place])  Active Problems:    Hypertension    Hyperlipidemia    Diabetes mellitus type 2, diet-controlled (HCC)    GERD (gastroesophageal reflux disease)    Hypothyroidism    CKD (chronic kidney disease) stage 3, GFR 30-59 ml/min    Persistent atrial fibrillation (HCC)    Chronic diastolic CHF (congestive heart failure) (HCC)        Brief Summary:  86 year old female with PMH of diet-controlled DM 2, hypothyroid, hypertension, hyperlipidemia, stage III chronic kidney disease, chronic diastolic CHF, A. fib on Xarelto, hard of hearing, presented to ED following a fall.  She reportedly had gone out to get her hair done, went out to start her car to warm it up, when she got out, apparently slipped on gravel and slid onto the ground.  She was unable to get up.  She had her phone and called her niece and they called 34.  She reportedly was very cold, lying on the ground.  She indicated to admitting MD that she has had a very hard time since her husband died in Oct 19, 2018.  She had dysphagia and was admitted at the hospital in [place] from 12/16-12/19 with GERD, was found to have COVID-19 infection and an E. coli UTI.  She had an EGD with Dr. Carlean Purl on 1/26 and was  found to have dysmotility/presbyesophagus and a grade 3 gastroesophageal flap.  As per patient and son, patient has been on liquids/soft diet since December.      Assessment and plan:     1.Left hip fracture (intertrochanteric femur fracture): Sustained after mechanical fall.  Orthopedics consulted and patient underwent IM nail with Dr. [name] on 09/23/2019.  As per orthopedic follow-up today, weightbearing as tolerated on left lower extremity, okay to leave surgical incisions open to air, pain better controlled after initiation of scheduled acetaminophen this morning.  They have cleared her for discharge to SNF with outpatient follow-up pain 2 weeks.  Continue prior home dose of Xarelto which will cover for her postop VTE prophylaxis.   2.Postop acute blood loss anemia/macrocytic anemia: Hemoglobin has been stable in the high 7 g range for the last 3 to 4 days without any overt bleeding.  B12 supplements started for low B12 levels.  FOBT negative.  S/p 1 unit PRBC transfusion on 09/25/2019.  Follow CBC biweekly at SNF and transfuse if hemoglobin 7 g or less.   3.Thrombocytopenia: Likely secondary to acute blood loss.  Resolved.   4.Paroxysmal atrial fibrillation: History of ablation of atrial flutter by Dr. Rayann Heman.  Clinically appears to be in sinus rhythm.  Continue Xarelto for secondary stroke prophylaxis.   5.Chronic diastolic CHF: Diuretics were temporarily held in the hospital.  Now that she is slowly improving, resumed Lasix and potassium supplements at reduced dose.  Monitor closely as outpatient and adjust doses as needed.   6.Diet-controlled DM 2: Reasonable inpatient control and has not required much SSI.  History of Metformin intolerance.  Monitor CBGs periodically at SNF and outpatient follow-up with PCP.   7.Essential hypertension: Antihypertensive doses were reduced, reasonable inpatient control.   8.Stage III CKD: Creatinine at baseline.  Follow BMP periodically at  SNF.   9.Hyperlipidemia: Continue statins.   10.GERD: Continue Protonix.   11.Esophageal dysmotility: Patient and son report that patient has been having trouble swallowing since she got sick  from the Covid in 07/2019.  Reportedly lost 20 pounds weight since then.  Followed by Dr. Carlean Purl, [facility].  Per her preference, continue liquid diet and soft food.  Close outpatient follow-up.   12.Hypothyroid: TSH 2.1.  Continue Synthroid.         Consultations:  .Orthopedic      Procedures:  .Left hip fracture repair with IM nail by Dr. [name] on 09/23/2019.         Discharge Instructions          Discharge Instructions          (HEART FAILURE PATIENTS) Call MD:  Anytime you have any of the following symptoms: 1) 3 pound weight gain in 24 hours or 5 pounds in 1 week 2) shortness of breath, with or without a dry hacking cough 3) swelling in the hands, feet or stomach 4) if you have to sleep on extra pillows at night in order to breathe.     Complete by: As directed           Call MD for:  difficulty breathing, headache or visual disturbances     Complete by: As directed           Call MD for:  extreme fatigue     Complete by: As directed           Call MD for:  persistant dizziness or light-headedness     Complete by: As directed           Call MD for:  persistant nausea and vomiting     Complete by: As directed           Call MD for:  redness, tenderness, or signs of infection (pain, swelling, redness, odor or green/yellow discharge around incision site)     Complete by: As directed           Call MD for:  severe uncontrolled pain     Complete by: As directed           Call MD for:  temperature >100.4     Complete by: As directed           Diet - low sodium heart healthy     Complete by: As directed           Liquids and soft diet.        Increase activity slowly     Complete by: As  directed                      Medication List          STOP taking these medications      traMADol 50 MG tablet  Commonly known as: ULTRAM              TAKE these medications      acetaminophen 500 MG tablet  Commonly known as: TYLENOL  Take 1 tablet (500 mg total) by mouth 4 (four) times daily.       albuterol 108 (90 Base) MCG/ACT inhaler  Commonly known as: VENTOLIN HFA  Inhale 2 puffs into the lungs every 6 (six) hours as needed for wheezing or shortness of breath.       AMBULATORY NON FORMULARY MEDICATION  Swish and swallow 30 mLs 4 (four) times daily as needed (mouth pain). GI cocktail  90 ml viscous lidocaine, 9ml-10mg /75ml dicyclomine, 210ml maalox  Sig: Swish and swallow 46ml's by mouth four times daily  benazepril 5 MG tablet  Commonly known as: LOTENSIN  Take 1 tablet (5 mg total) by mouth daily.  Start taking on: September 29, 2019  What changed:   .medication strength   .how much to take        clonazePAM 0.5 MG tablet  Commonly known as: KLONOPIN  Take 0.5 tablets (0.25 mg total) by mouth at bedtime.       cloNIDine 0.1 MG tablet  Commonly known as: CATAPRES  Take 1 tablet (0.1 mg total) by mouth at bedtime.  What changed: when to take this       cyanocobalamin 1000 MCG tablet  Take 1 tablet (1,000 mcg total) by mouth daily.  Start taking on: September 29, 2019       docusate sodium 100 MG capsule  Commonly known as: COLACE  Take 1 capsule (100 mg total) by mouth 2 (two) times daily.       feeding supplement (ENSURE ENLIVE) Liqd  Take 237 mLs by mouth 2 (two) times daily between meals.       fluticasone 50 MCG/ACT nasal spray  Commonly known as: FLONASE  Place 2 sprays into both nostrils daily.       furosemide 40 MG tablet  Commonly known as: LASIX  Take 1 tablet (40 mg total) by mouth daily.  Start taking on: September 29, 2019  What changed:    .how much to take   .how to take this   .when to take this   .additional instructions        HYDROcodone-acetaminophen 5-325 MG tablet  Commonly known as: NORCO/VICODIN  Take 1 tablet by mouth every 6 (six) hours as needed for severe pain.       levothyroxine 50 MCG tablet  Commonly known as: SYNTHROID  TAKE 1 TABLET BY MOUTH  DAILY BEFORE BREAKFAST  What changed:   .how much to take   .how to take this   .when to take this   .additional instructions        loratadine 10 MG tablet  Commonly known as: CLARITIN  Take 10 mg by mouth daily as needed for allergies.       lovastatin 40 MG tablet  Commonly known as: MEVACOR  Take 1 tablet (40 mg total) by mouth at bedtime.       multivitamin with minerals Tabs tablet  Take 1 tablet by mouth daily.  Start taking on: September 29, 2019       pantoprazole 40 MG tablet  Commonly known as: PROTONIX  Take 1 tablet (40 mg total) by mouth daily.       polyethylene glycol 17 g packet  Commonly known as: MIRALAX / GLYCOLAX  Take 17 g by mouth daily as needed for mild constipation.       potassium chloride SA 20 MEQ tablet  Commonly known as: KLOR-CON  Take 1 tablet (20 mEq total) by mouth daily.  What changed: when to take this       raloxifene 60 MG tablet  Commonly known as: EVISTA  Take 1 tablet (60 mg total) by mouth daily.       Rivaroxaban 15 MG Tabs tablet  Commonly known as: XARELTO  Take 1 tablet (15 mg total) by mouth daily with supper.       Vitamin D-3 125 MCG (5000 UT) Tabs  Take 5,000 Units by mouth daily.  Allergies    Allergen   Reactions    .   Amlodipine   Swelling    .   Macrodantin   Nausea And Vomiting    .   Metformin And Related   Nausea And Vomiting and Other (See Comments)            Bloating    .   Penicillins   Rash            Did it involve swelling of  the face/tongue/throat, SOB, or low BP? Unknown  Did it involve sudden or severe rash/hives, skin peeling, or any reaction on the inside of your mouth or nose? Yes  Did you need to seek medical attention at a hospital or doctor's office? Yes  When did it last happen? 2005     If all above answers are "NO", may proceed with cephalosporin use.                   Procedures/Studies:  .DG Chest 1 View   .    Marland KitchenResult Date: 09/22/2019   .CLINICAL DATA:  Hip fracture. EXAM: CHEST  1 VIEW COMPARISON:  None. FINDINGS: The heart size and mediastinal contours are within normal limits. Both lungs are clear. The visualized skeletal structures are unremarkable. IMPRESSION: No active disease. Electronically Signed   By: Marijo Conception M.D.   On: 09/22/2019 11:14    .    Marland KitchenCT HEAD WO CONTRAST   .    Marland KitchenResult Date: 09/22/2019   .CLINICAL DATA:  Tripped and fell, left hip injury, anticoagulated for atrial fibrillation EXAM: CT HEAD WITHOUT CONTRAST TECHNIQUE: Contiguous axial images were obtained from the base of the skull through the vertex without intravenous contrast. COMPARISON:  None. FINDINGS: Brain: No acute infarct or hemorrhage. Lateral ventricles and midline structures are unremarkable. No acute extra-axial fluid collections. No mass effect. Vascular: No hyperdense vessel or unexpected calcification. Skull: Normal. Negative for fracture or focal lesion. Sinuses/Orbits: No acute finding. Other: None IMPRESSION: 1. No acute intracranial process.  No evidence of trauma. Electronically Signed   By: Randa Ngo M.D.   On: 09/22/2019 11:38    .    Marland KitchenCT CERVICAL SPINE WO CONTRAST   .    Marland KitchenResult Date: 09/22/2019   .CLINICAL DATA:  [place] this morning, anticoagulated, left hip injury EXAM: CT CERVICAL SPINE WITHOUT CONTRAST TECHNIQUE: Multidetector CT imaging of the cervical spine was performed without intravenous contrast. Multiplanar CT image reconstructions were also  generated. COMPARISON:  None. FINDINGS: Alignment: There is mild levoscoliosis of the cervical spine. Otherwise alignment is anatomic. Skull base and vertebrae: There are no acute displaced cervical spine fractures. Soft tissues and spinal canal: There is extensive atherosclerosis within the coronary vasculature. Prevertebral soft tissues are unremarkable. Airways patent. Disc levels: Multilevel cervical spondylosis is identified. Mild circumferential disc osteophyte complex is seen from C3-4 through C6-7. There is a right predominant neural foraminal encroachment at these levels. Bony fusion across the bilateral facet joints at C2/C3. Upper chest: Lung apices are clear. Other: Reconstructed images demonstrate no additional findings. IMPRESSION: 1. No acute cervical spine fracture. 2. Multilevel cervical spondylosis. 3. Prominent atherosclerosis of the carotid bifurcations. Electronically Signed   By: Randa Ngo M.D.   On: 09/22/2019 11:42    .    Marland KitchenDG C-Arm 1-60 Min   .    Marland KitchenResult Date: 09/23/2019   .CLINICAL DATA:  Intertrochanteric left hip fracture EXAM: LEFT FEMUR 2 VIEWS; DG C-ARM 1-60 MIN  COMPARISON:  09/22/2019 FINDINGS: Eight fluoroscopic images are obtained during the performance of the procedure and are provided for interpretation only. Comminuted intertrochanteric left hip fracture is identified. Placement of an intramedullary rod with proximal dynamic screws and distal interlocking screw. Alignment is near anatomic. FLUOROSCOPY TIME:  1 minutes 52 seconds IMPRESSION: ORIF left hip fracture. Electronically Signed   By: Randa Ngo M.D.   On: 09/23/2019 12:08    .    Marland KitchenDG Hip Unilat With Pelvis 2-3 Views Left   .    Marland KitchenResult Date: 09/22/2019   .CLINICAL DATA:  Left hip pain after fall. EXAM: DG HIP (WITH OR WITHOUT PELVIS) 2-3V LEFT COMPARISON:  None. FINDINGS: Severely displaced and comminuted fracture is seen involving the intertrochanteric region of the proximal left  femur. Vascular calcifications are noted. IMPRESSION: Severely displaced and comminuted intertrochanteric fracture of proximal left femur. Electronically Signed   By: Marijo Conception M.D.   On: 09/22/2019 11:13    .    Marland KitchenDG FEMUR MIN 2 VIEWS LEFT   .    Marland KitchenResult Date: 09/23/2019   .CLINICAL DATA:  Intertrochanteric left hip fracture EXAM: LEFT FEMUR 2 VIEWS; DG C-ARM 1-60 MIN COMPARISON:  09/22/2019 FINDINGS: Eight fluoroscopic images are obtained during the performance of the procedure and are provided for interpretation only. Comminuted intertrochanteric left hip fracture is identified. Placement of an intramedullary rod with proximal dynamic screws and distal interlocking screw. Alignment is near anatomic. FLUOROSCOPY TIME:  1 minutes 52 seconds IMPRESSION: ORIF left hip fracture. Electronically Signed   By: Randa Ngo M.D.   On: 09/23/2019 12:08    .    Marland KitchenDG FEMUR PORT MIN 2 VIEWS LEFT   .    Marland KitchenResult Date: 09/23/2019   .CLINICAL DATA:  ORIF left hip fracture EXAM: LEFT FEMUR PORTABLE 2 VIEWS COMPARISON:  09/22/2019 FINDINGS: Frontal and lateral views of the left femur are obtained. Intramedullary rod with proximal dynamic screws and a distal interlocking screw spans a comminuted proximal left femoral fracture. Alignment is anatomic. Postsurgical changes are seen in the soft tissues. IMPRESSION: 1. ORIF proximal left femoral fracture. Electronically Signed   By: Randa Ngo M.D.   On: 09/23/2019 13:23    .             Subjective:  Patient interviewed and examined along with her female RN in the room.  Had just finished working with PT.  Reported significant left hip postop pain which was better when she was laying in bed prior to therapies.  Also concerned about her anemia-discussed this in detail with her and her son.  Per subsequent report, pain better controlled after scheduled Tylenol.  No chest pain or dyspnea reported.     Discharge Exam:             Vitals:        09/28/19 0408   09/28/19 0737   09/28/19 1221   09/28/19 1247    BP:   (!) 121/50   (!) 136/59   (!) 165/88   (!) 140/53    Pulse:   65   64       72    Resp:   15   15       17     Temp:   98.2 F (36.8 C)   98.3 F (36.8 C)       98.9 F (37.2 C)    TempSrc:   Oral   Oral  Oral    SpO2:   95%   100%       99%    Weight:                    Height:                          General: Pleasant elderly female, moderately built and nourished, lying propped up in bed in some discomfort this morning.  Cardiovascular: S1 & S2 heard, RRR, S1/S2 +. No murmurs, rubs, gallops or clicks. No JVD.  Trace bilateral ankle edema.  Respiratory: Clear to auscultation without wheezing, rhonchi or crackles. No increased work of breathing.  Abdominal:  Non distended, non tender & soft. No organomegaly or masses appreciated. Normal bowel sounds heard.  CNS: Alert and oriented. No focal deficits.  Extremely hard of hearing.  Extremities: no edema, no cyanosis.  Restricted left lower extremity movement secondary to postop pain.  Surgical incision sites at left hip and left lower lateral thigh without acute findings.            The results of significant diagnostics from this hospitalization (including imaging, microbiology, ancillary and laboratory) are listed below for reference.           Microbiology:          Recent Results (from the past 240 hour(s))    Respiratory Panel by RT PCR (Flu A&B, Covid) - Nasopharyngeal Swab     Status: None        Collection Time: 09/22/19 11:32 AM        Specimen: Nasopharyngeal Swab    Result   Value   Ref Range   Status        SARS Coronavirus 2 by RT PCR   NEGATIVE   NEGATIVE   Final            Comment:   (NOTE)  SARS-CoV-2 target nucleic acids are NOT DETECTED.  The SARS-CoV-2 RNA is generally  detectable in upper respiratoy  specimens during the acute phase of infection. The lowest  concentration of SARS-CoV-2 viral copies this assay can detect is  131 copies/mL. A negative result does not preclude SARS-Cov-2  infection and should not be used as the sole basis for treatment or  other patient management decisions. A negative result may occur with   improper specimen collection/handling, submission of specimen other  than nasopharyngeal swab, presence of viral mutation(s) within the  areas targeted by this assay, and inadequate number of viral copies  (<131 copies/mL). A negative result must be combined with clinical  observations, patient history, and epidemiological information. The  expected result is Negative.  Fact Sheet for Patients:   PinkCheek.be  Fact Sheet for Healthcare Providers:   GravelBags.it  This test is not yet ap proved or cleared by the [place] FDA and   has been authorized for detection and/or diagnosis of SARS-CoV-2 by  FDA under an Emergency Use Authorization (EUA). This EUA will remain   in effect (meaning this test can be used) for the duration of the  COVID-19 declaration under Section 564(b)(1) of the Act, 21 U.S.C.  section 360bbb-3(b)(1), unless the authorization is terminated or  revoked sooner.           Influenza A by PCR   NEGATIVE   NEGATIVE   Final        Influenza B by PCR   NEGATIVE   NEGATIVE  Final            Comment:   (NOTE)  The Xpert Xpress SARS-CoV-2/FLU/RSV assay is intended as an aid in   the diagnosis of influenza from Nasopharyngeal swab specimens and   should not be used as a sole basis for treatment. Nasal washings and   aspirates are unacceptable for Xpert Xpress SARS-CoV-2/FLU/RSV   testing.  Fact Sheet for Patients:  PinkCheek.be  Fact Sheet for Healthcare  Providers:  GravelBags.it  This test is not yet approved or cleared by the [place] FDA and   has been authorized for detection and/or diagnosis of SARS-CoV-2 by   FDA under an Emergency Use Authorization (EUA). This EUA will remain   in effect (meaning this test can be used) for the duration of the   Covid-19 declaration under Section 564(b)(1) of the Act, 21   U.S.C. section 360bbb-3(b)(1), unless the authorization is   terminated or revoked.  Performed at [facility], [place] [address] [place], [place]  [address]       Surgical pcr screen     Status: None        Collection Time: 09/22/19  5:32 PM        Specimen: Nasal Mucosa; Nasal Swab    Result   Value   Ref Range   Status        MRSA, PCR   NEGATIVE   NEGATIVE   Final        Staphylococcus aureus   NEGATIVE   NEGATIVE   Final            Comment:   (NOTE)  The Xpert SA Assay (FDA approved for NASAL specimens in patients 35  years of age and older), is one component of a comprehensive  surveillance program. It is not intended to diagnose infection nor to  guide or monitor treatment.  Performed at [facility], [place] [address] [place], [place]  [address]       Culture, Urine     Status: Abnormal        Collection Time: 09/23/19  5:27 PM        Specimen: Urine, Random    Result   Value   Ref Range   Status        Specimen Description   URINE, RANDOM       Final        Special Requests           Final            NONE  Performed at [facility], [place] [address], [place], [place] [address]           Culture   MULTIPLE SPECIES PRESENT, SUGGEST RECOLLECTION (A)       Final        Report Status   09/24/2019 FINAL       Final    SARS CORONAVIRUS 2 (TAT 6-24 HRS) Nasopharyngeal Nasopharyngeal Swab     Status: None        Collection Time: 09/25/19  7:14  PM        Specimen: Nasopharyngeal Swab    Result   Value   Ref Range   Status        SARS Coronavirus 2   NEGATIVE   NEGATIVE   Final            Comment:   (NOTE)  SARS-CoV-2 target nucleic acids are NOT DETECTED.  The SARS-CoV-2 RNA is generally detectable in upper and lower  respiratory specimens during the acute phase of infection. Negative  results do not preclude SARS-CoV-2 infection, do not rule out  co-infections with other pathogens, and should not be used as the  sole basis for treatment or other patient management decisions.  Negative results must be combined with clinical observations,  patient history, and epidemiological information. The expected  result is Negative.  Fact Sheet for Patients:  SugarRoll.be  Fact Sheet for Healthcare Providers:  https://www.woods-mathews.com/  This test is not yet approved or cleared by the [place] FDA and   has been authorized for detection and/or diagnosis of SARS-CoV-2 by  FDA under an Emergency Use Authorization (EUA). This EUA will remain   in effect (meaning this test can be used) for the duration of the  COVID-19 declaration under Section 56 4(b)(1) of the Act, 21 U.S.C.  section 360bbb-3(b)(1), unless the authorization is terminated or  revoked sooner.  Performed at [facility], [place] [address] [place], [place]  [address]       SARS CORONAVIRUS 2 (TAT 6-24 HRS) Nasopharyngeal Nasopharyngeal Swab     Status: None        Collection Time: 09/27/19  9:46 AM        Specimen: Nasopharyngeal Swab    Result   Value   Ref Range   Status        SARS Coronavirus 2   NEGATIVE   NEGATIVE   Final            Comment:   (NOTE)  SARS-CoV-2 target nucleic acids are NOT DETECTED.  The SARS-CoV-2 RNA is generally detectable in upper and lower  respiratory specimens during the acute phase of infection.  Negative  results do not preclude SARS-CoV-2 infection, do not rule out  co-infections with other pathogens, and should not be used as the  sole basis for treatment or other patient management decisions.  Negative results must be combined with clinical observations,  patient history, and epidemiological information. The expected  result is Negative.  Fact Sheet for Patients:  SugarRoll.be  Fact Sheet for Healthcare Providers:  https://www.woods-mathews.com/  This test is not yet approved or cleared by the [place] FDA and   has been authorized for detection and/or diagnosis of SARS-CoV-2 by  FDA under an Emergency Use Authorization (EUA). This EUA will remain   in effect (meaning this test can be used) for the duration of the  COVID-19 declaration under Section 56 4(b)(1) of the Act, 21 U.S.C.  section 360bbb-3(b)(1), unless the authorization is terminated or  revoked sooner.  Performed at [facility], [place] [address] [place], [place]  [address]             Labs:  CBC:  Last Labs  Basic Metabolic Panel:  Last Labs                                                                                                                                                                                                              Liver Function Tests:  Last Labs                                                                                                 CBG:  Last Labs                                                                         Urinalysis  Labs (Brief)  I discussed in detail with patient's son via phone, updated care and answered all questions.     Time coordinating discharge: 40 minutes     SIGNED:     Vernell Leep, MD, [place], [facility].  [facility]     To contact the attending provider between 7A-7P or the covering provider during after hours 7P-7A, please log into the web site [email or web address] and access using universal [place] password for that web site. If you do not have the password, please call the hospital operator.             Electronically signed by Modena Jansky, MD at 09/28/2019  2:16 PM             ED to Hosp-Admission (Discharged) on 09/22/2019               Routing History               Detailed Report             Note shared with patient    Plan I attempted to reach her, with No answer and No voicemail system available.  Will attempt to contact again within the 2 business day post discharge window if she does not return my call.      Cleda Clarks Markiah Janeway, LPN

## 2019-10-18 ENCOUNTER — Telehealth: Payer: Self-pay | Admitting: *Deleted

## 2019-10-18 NOTE — Telephone Encounter (Signed)
Already attempted

## 2019-10-18 NOTE — Telephone Encounter (Signed)
Please make TOC appointment

## 2019-10-18 NOTE — Telephone Encounter (Signed)
    Transitional Care Management  Contact Attempt Attempt Date:10/18/2019 Attempted By: Eston Mould, LPN  2nd unsuccessful TCM contact attempt.   I reached out to Katie [name] on her preferred telephone number to discuss Transitional Care Management, medication reconciliation, and to schedule a TCM hospital follow-up with her PCP at [facility].  Discharge Date: 10/16/19 Location: SNF Discharge Dx: post-rehab for displaced intertrochanteric fracture of Left Femur  Recommendations for Outpatient Follow-up:  <14 days from [place] follow up with PCP, [name], FNP   Plan I could not leave a message for pt. No voicemail available.

## 2019-10-20 ENCOUNTER — Ambulatory Visit (INDEPENDENT_AMBULATORY_CARE_PROVIDER_SITE_OTHER): Payer: Medicare Other | Admitting: *Deleted

## 2019-10-20 DIAGNOSIS — I4819 Other persistent atrial fibrillation: Secondary | ICD-10-CM

## 2019-10-20 LAB — CUP PACEART REMOTE DEVICE CHECK
Date Time Interrogation Session: 20210304031618
Implantable Pulse Generator Implant Date: 20200819

## 2019-10-20 NOTE — Progress Notes (Signed)
ILR Remote 

## 2019-10-21 DIAGNOSIS — I4819 Other persistent atrial fibrillation: Secondary | ICD-10-CM | POA: Diagnosis not present

## 2019-10-21 DIAGNOSIS — Z8616 Personal history of COVID-19: Secondary | ICD-10-CM | POA: Diagnosis not present

## 2019-10-21 DIAGNOSIS — N1832 Chronic kidney disease, stage 3b: Secondary | ICD-10-CM | POA: Diagnosis not present

## 2019-10-21 DIAGNOSIS — K224 Dyskinesia of esophagus: Secondary | ICD-10-CM | POA: Diagnosis not present

## 2019-10-21 DIAGNOSIS — G8929 Other chronic pain: Secondary | ICD-10-CM | POA: Diagnosis not present

## 2019-10-21 DIAGNOSIS — E1151 Type 2 diabetes mellitus with diabetic peripheral angiopathy without gangrene: Secondary | ICD-10-CM | POA: Diagnosis not present

## 2019-10-21 DIAGNOSIS — I5032 Chronic diastolic (congestive) heart failure: Secondary | ICD-10-CM | POA: Diagnosis not present

## 2019-10-21 DIAGNOSIS — M80052D Age-related osteoporosis with current pathological fracture, left femur, subsequent encounter for fracture with routine healing: Secondary | ICD-10-CM | POA: Diagnosis not present

## 2019-10-21 DIAGNOSIS — E1122 Type 2 diabetes mellitus with diabetic chronic kidney disease: Secondary | ICD-10-CM | POA: Diagnosis not present

## 2019-10-21 DIAGNOSIS — I130 Hypertensive heart and chronic kidney disease with heart failure and stage 1 through stage 4 chronic kidney disease, or unspecified chronic kidney disease: Secondary | ICD-10-CM | POA: Diagnosis not present

## 2019-10-21 DIAGNOSIS — Z7901 Long term (current) use of anticoagulants: Secondary | ICD-10-CM | POA: Diagnosis not present

## 2019-10-21 DIAGNOSIS — K219 Gastro-esophageal reflux disease without esophagitis: Secondary | ICD-10-CM | POA: Diagnosis not present

## 2019-10-21 DIAGNOSIS — E538 Deficiency of other specified B group vitamins: Secondary | ICD-10-CM | POA: Diagnosis not present

## 2019-10-21 DIAGNOSIS — F5101 Primary insomnia: Secondary | ICD-10-CM | POA: Diagnosis not present

## 2019-10-21 DIAGNOSIS — R1314 Dysphagia, pharyngoesophageal phase: Secondary | ICD-10-CM | POA: Diagnosis not present

## 2019-10-21 DIAGNOSIS — Z79891 Long term (current) use of opiate analgesic: Secondary | ICD-10-CM | POA: Diagnosis not present

## 2019-10-21 DIAGNOSIS — J302 Other seasonal allergic rhinitis: Secondary | ICD-10-CM | POA: Diagnosis not present

## 2019-10-21 DIAGNOSIS — K5900 Constipation, unspecified: Secondary | ICD-10-CM | POA: Diagnosis not present

## 2019-10-21 DIAGNOSIS — E785 Hyperlipidemia, unspecified: Secondary | ICD-10-CM | POA: Diagnosis not present

## 2019-10-21 DIAGNOSIS — D509 Iron deficiency anemia, unspecified: Secondary | ICD-10-CM | POA: Diagnosis not present

## 2019-10-21 DIAGNOSIS — Z03818 Encounter for observation for suspected exposure to other biological agents ruled out: Secondary | ICD-10-CM | POA: Diagnosis not present

## 2019-10-21 DIAGNOSIS — E039 Hypothyroidism, unspecified: Secondary | ICD-10-CM | POA: Diagnosis not present

## 2019-10-24 ENCOUNTER — Other Ambulatory Visit: Payer: Self-pay | Admitting: Nurse Practitioner

## 2019-10-24 ENCOUNTER — Telehealth: Payer: Self-pay | Admitting: Nurse Practitioner

## 2019-10-24 DIAGNOSIS — J302 Other seasonal allergic rhinitis: Secondary | ICD-10-CM | POA: Diagnosis not present

## 2019-10-24 DIAGNOSIS — I130 Hypertensive heart and chronic kidney disease with heart failure and stage 1 through stage 4 chronic kidney disease, or unspecified chronic kidney disease: Secondary | ICD-10-CM | POA: Diagnosis not present

## 2019-10-24 DIAGNOSIS — K224 Dyskinesia of esophagus: Secondary | ICD-10-CM | POA: Diagnosis not present

## 2019-10-24 DIAGNOSIS — F5101 Primary insomnia: Secondary | ICD-10-CM

## 2019-10-24 DIAGNOSIS — Z79891 Long term (current) use of opiate analgesic: Secondary | ICD-10-CM | POA: Diagnosis not present

## 2019-10-24 DIAGNOSIS — R600 Localized edema: Secondary | ICD-10-CM

## 2019-10-24 DIAGNOSIS — M8580 Other specified disorders of bone density and structure, unspecified site: Secondary | ICD-10-CM

## 2019-10-24 DIAGNOSIS — E039 Hypothyroidism, unspecified: Secondary | ICD-10-CM | POA: Diagnosis not present

## 2019-10-24 DIAGNOSIS — I10 Essential (primary) hypertension: Secondary | ICD-10-CM

## 2019-10-24 DIAGNOSIS — E1151 Type 2 diabetes mellitus with diabetic peripheral angiopathy without gangrene: Secondary | ICD-10-CM | POA: Diagnosis not present

## 2019-10-24 DIAGNOSIS — K219 Gastro-esophageal reflux disease without esophagitis: Secondary | ICD-10-CM | POA: Diagnosis not present

## 2019-10-24 DIAGNOSIS — E785 Hyperlipidemia, unspecified: Secondary | ICD-10-CM | POA: Diagnosis not present

## 2019-10-24 DIAGNOSIS — I5032 Chronic diastolic (congestive) heart failure: Secondary | ICD-10-CM | POA: Diagnosis not present

## 2019-10-24 DIAGNOSIS — E782 Mixed hyperlipidemia: Secondary | ICD-10-CM

## 2019-10-24 DIAGNOSIS — M80052D Age-related osteoporosis with current pathological fracture, left femur, subsequent encounter for fracture with routine healing: Secondary | ICD-10-CM | POA: Diagnosis not present

## 2019-10-24 DIAGNOSIS — Z8616 Personal history of COVID-19: Secondary | ICD-10-CM | POA: Diagnosis not present

## 2019-10-24 DIAGNOSIS — E876 Hypokalemia: Secondary | ICD-10-CM

## 2019-10-24 DIAGNOSIS — K5900 Constipation, unspecified: Secondary | ICD-10-CM | POA: Diagnosis not present

## 2019-10-24 DIAGNOSIS — R609 Edema, unspecified: Secondary | ICD-10-CM

## 2019-10-24 DIAGNOSIS — E538 Deficiency of other specified B group vitamins: Secondary | ICD-10-CM | POA: Diagnosis not present

## 2019-10-24 DIAGNOSIS — I4819 Other persistent atrial fibrillation: Secondary | ICD-10-CM | POA: Diagnosis not present

## 2019-10-24 DIAGNOSIS — Z7901 Long term (current) use of anticoagulants: Secondary | ICD-10-CM | POA: Diagnosis not present

## 2019-10-24 DIAGNOSIS — N1832 Chronic kidney disease, stage 3b: Secondary | ICD-10-CM | POA: Diagnosis not present

## 2019-10-24 DIAGNOSIS — Z03818 Encounter for observation for suspected exposure to other biological agents ruled out: Secondary | ICD-10-CM | POA: Diagnosis not present

## 2019-10-24 DIAGNOSIS — R1314 Dysphagia, pharyngoesophageal phase: Secondary | ICD-10-CM | POA: Diagnosis not present

## 2019-10-24 DIAGNOSIS — E1122 Type 2 diabetes mellitus with diabetic chronic kidney disease: Secondary | ICD-10-CM | POA: Diagnosis not present

## 2019-10-24 DIAGNOSIS — G8929 Other chronic pain: Secondary | ICD-10-CM | POA: Diagnosis not present

## 2019-10-24 DIAGNOSIS — D509 Iron deficiency anemia, unspecified: Secondary | ICD-10-CM | POA: Diagnosis not present

## 2019-10-24 MED ORDER — POTASSIUM CHLORIDE CRYS ER 20 MEQ PO TBCR: 20.0000 meq | EXTENDED_RELEASE_TABLET | Freq: Every day | ORAL | 0 refills | Status: DC

## 2019-10-24 MED ORDER — CLONIDINE HCL 0.1 MG PO TABS: 0.1000 mg | ORAL_TABLET | Freq: Every day | ORAL | Status: DC

## 2019-10-24 MED ORDER — LEVOTHYROXINE SODIUM 50 MCG PO TABS: ORAL_TABLET | ORAL | 1 refills | Status: DC

## 2019-10-24 MED ORDER — PANTOPRAZOLE SODIUM 40 MG PO TBEC: 40.0000 mg | DELAYED_RELEASE_TABLET | Freq: Every day | ORAL | 0 refills | Status: DC

## 2019-10-24 MED ORDER — RIVAROXABAN 15 MG PO TABS: 15.0000 mg | ORAL_TABLET | Freq: Every day | ORAL | 11 refills | Status: DC

## 2019-10-24 MED ORDER — RALOXIFENE HCL 60 MG PO TABS: 60.0000 mg | ORAL_TABLET | Freq: Every day | ORAL | 0 refills | Status: DC

## 2019-10-24 MED ORDER — LOVASTATIN 40 MG PO TABS: 40.0000 mg | ORAL_TABLET | Freq: Every day | ORAL | 0 refills | Status: DC

## 2019-10-24 MED ORDER — FUROSEMIDE 40 MG PO TABS: 40.0000 mg | ORAL_TABLET | Freq: Every day | ORAL | Status: DC

## 2019-10-24 MED ORDER — BENAZEPRIL HCL 5 MG PO TABS: 5.0000 mg | ORAL_TABLET | Freq: Every day | ORAL | Status: DC

## 2019-10-24 NOTE — Telephone Encounter (Signed)
Patient is staying with her son due to broken hip. Sent all meds to pharmacy near son for patient

## 2019-10-24 NOTE — Telephone Encounter (Signed)
Pt says the doctor at [facility] prescribed her Levothyroxine 75mg  when she was there but says [facility] only sent in refills for 50mg . Pt wants to know if this can be adjusted or does she need to wait until her next appt.

## 2019-10-25 ENCOUNTER — Ambulatory Visit (INDEPENDENT_AMBULATORY_CARE_PROVIDER_SITE_OTHER): Payer: Medicare Other

## 2019-10-25 ENCOUNTER — Other Ambulatory Visit: Payer: Self-pay

## 2019-10-25 DIAGNOSIS — J302 Other seasonal allergic rhinitis: Secondary | ICD-10-CM

## 2019-10-25 DIAGNOSIS — K219 Gastro-esophageal reflux disease without esophagitis: Secondary | ICD-10-CM

## 2019-10-25 DIAGNOSIS — K5900 Constipation, unspecified: Secondary | ICD-10-CM

## 2019-10-25 DIAGNOSIS — E1122 Type 2 diabetes mellitus with diabetic chronic kidney disease: Secondary | ICD-10-CM

## 2019-10-25 DIAGNOSIS — I5032 Chronic diastolic (congestive) heart failure: Secondary | ICD-10-CM | POA: Diagnosis not present

## 2019-10-25 DIAGNOSIS — E1151 Type 2 diabetes mellitus with diabetic peripheral angiopathy without gangrene: Secondary | ICD-10-CM

## 2019-10-25 DIAGNOSIS — F1320 Sedative, hypnotic or anxiolytic dependence, uncomplicated: Secondary | ICD-10-CM

## 2019-10-25 DIAGNOSIS — Z8616 Personal history of COVID-19: Secondary | ICD-10-CM

## 2019-10-25 DIAGNOSIS — E039 Hypothyroidism, unspecified: Secondary | ICD-10-CM

## 2019-10-25 DIAGNOSIS — N1832 Chronic kidney disease, stage 3b: Secondary | ICD-10-CM | POA: Diagnosis not present

## 2019-10-25 DIAGNOSIS — F419 Anxiety disorder, unspecified: Secondary | ICD-10-CM

## 2019-10-25 DIAGNOSIS — M80052D Age-related osteoporosis with current pathological fracture, left femur, subsequent encounter for fracture with routine healing: Secondary | ICD-10-CM | POA: Diagnosis not present

## 2019-10-25 DIAGNOSIS — Z7901 Long term (current) use of anticoagulants: Secondary | ICD-10-CM

## 2019-10-25 DIAGNOSIS — I130 Hypertensive heart and chronic kidney disease with heart failure and stage 1 through stage 4 chronic kidney disease, or unspecified chronic kidney disease: Secondary | ICD-10-CM

## 2019-10-25 DIAGNOSIS — I4819 Other persistent atrial fibrillation: Secondary | ICD-10-CM

## 2019-10-25 DIAGNOSIS — G8929 Other chronic pain: Secondary | ICD-10-CM

## 2019-10-25 DIAGNOSIS — D509 Iron deficiency anemia, unspecified: Secondary | ICD-10-CM

## 2019-10-25 DIAGNOSIS — F5101 Primary insomnia: Secondary | ICD-10-CM

## 2019-10-25 DIAGNOSIS — R1314 Dysphagia, pharyngoesophageal phase: Secondary | ICD-10-CM

## 2019-10-25 DIAGNOSIS — E538 Deficiency of other specified B group vitamins: Secondary | ICD-10-CM

## 2019-10-25 DIAGNOSIS — K224 Dyskinesia of esophagus: Secondary | ICD-10-CM

## 2019-10-25 DIAGNOSIS — E785 Hyperlipidemia, unspecified: Secondary | ICD-10-CM

## 2019-10-25 DIAGNOSIS — Z79891 Long term (current) use of opiate analgesic: Secondary | ICD-10-CM

## 2019-10-25 NOTE — Telephone Encounter (Signed)
Ok to take the 56mcg, because that is what we had her on. She will need repeat lab work in about 1 month

## 2019-10-25 NOTE — Telephone Encounter (Signed)
RETURNED CALL, BUSY SIGNAL

## 2019-10-25 NOTE — Telephone Encounter (Signed)
Patient aware and verbalized understanding. °

## 2019-10-28 DIAGNOSIS — J302 Other seasonal allergic rhinitis: Secondary | ICD-10-CM | POA: Diagnosis not present

## 2019-10-28 DIAGNOSIS — Z8616 Personal history of COVID-19: Secondary | ICD-10-CM | POA: Diagnosis not present

## 2019-10-28 DIAGNOSIS — E538 Deficiency of other specified B group vitamins: Secondary | ICD-10-CM | POA: Diagnosis not present

## 2019-10-28 DIAGNOSIS — Z79891 Long term (current) use of opiate analgesic: Secondary | ICD-10-CM | POA: Diagnosis not present

## 2019-10-28 DIAGNOSIS — E039 Hypothyroidism, unspecified: Secondary | ICD-10-CM | POA: Diagnosis not present

## 2019-10-28 DIAGNOSIS — E1122 Type 2 diabetes mellitus with diabetic chronic kidney disease: Secondary | ICD-10-CM | POA: Diagnosis not present

## 2019-10-28 DIAGNOSIS — E1151 Type 2 diabetes mellitus with diabetic peripheral angiopathy without gangrene: Secondary | ICD-10-CM | POA: Diagnosis not present

## 2019-10-28 DIAGNOSIS — I130 Hypertensive heart and chronic kidney disease with heart failure and stage 1 through stage 4 chronic kidney disease, or unspecified chronic kidney disease: Secondary | ICD-10-CM | POA: Diagnosis not present

## 2019-10-28 DIAGNOSIS — R1314 Dysphagia, pharyngoesophageal phase: Secondary | ICD-10-CM | POA: Diagnosis not present

## 2019-10-28 DIAGNOSIS — I4819 Other persistent atrial fibrillation: Secondary | ICD-10-CM | POA: Diagnosis not present

## 2019-10-28 DIAGNOSIS — D509 Iron deficiency anemia, unspecified: Secondary | ICD-10-CM | POA: Diagnosis not present

## 2019-10-28 DIAGNOSIS — K219 Gastro-esophageal reflux disease without esophagitis: Secondary | ICD-10-CM | POA: Diagnosis not present

## 2019-10-28 DIAGNOSIS — I5032 Chronic diastolic (congestive) heart failure: Secondary | ICD-10-CM | POA: Diagnosis not present

## 2019-10-28 DIAGNOSIS — Z03818 Encounter for observation for suspected exposure to other biological agents ruled out: Secondary | ICD-10-CM | POA: Diagnosis not present

## 2019-10-28 DIAGNOSIS — N1832 Chronic kidney disease, stage 3b: Secondary | ICD-10-CM | POA: Diagnosis not present

## 2019-10-28 DIAGNOSIS — K5900 Constipation, unspecified: Secondary | ICD-10-CM | POA: Diagnosis not present

## 2019-10-28 DIAGNOSIS — G8929 Other chronic pain: Secondary | ICD-10-CM | POA: Diagnosis not present

## 2019-10-28 DIAGNOSIS — K224 Dyskinesia of esophagus: Secondary | ICD-10-CM | POA: Diagnosis not present

## 2019-10-28 DIAGNOSIS — F5101 Primary insomnia: Secondary | ICD-10-CM | POA: Diagnosis not present

## 2019-10-28 DIAGNOSIS — Z7901 Long term (current) use of anticoagulants: Secondary | ICD-10-CM | POA: Diagnosis not present

## 2019-10-28 DIAGNOSIS — E785 Hyperlipidemia, unspecified: Secondary | ICD-10-CM | POA: Diagnosis not present

## 2019-10-28 DIAGNOSIS — M80052D Age-related osteoporosis with current pathological fracture, left femur, subsequent encounter for fracture with routine healing: Secondary | ICD-10-CM | POA: Diagnosis not present

## 2019-10-31 DIAGNOSIS — Z7901 Long term (current) use of anticoagulants: Secondary | ICD-10-CM | POA: Diagnosis not present

## 2019-10-31 DIAGNOSIS — R1314 Dysphagia, pharyngoesophageal phase: Secondary | ICD-10-CM | POA: Diagnosis not present

## 2019-10-31 DIAGNOSIS — F5101 Primary insomnia: Secondary | ICD-10-CM | POA: Diagnosis not present

## 2019-10-31 DIAGNOSIS — E1122 Type 2 diabetes mellitus with diabetic chronic kidney disease: Secondary | ICD-10-CM | POA: Diagnosis not present

## 2019-10-31 DIAGNOSIS — I5032 Chronic diastolic (congestive) heart failure: Secondary | ICD-10-CM | POA: Diagnosis not present

## 2019-10-31 DIAGNOSIS — K224 Dyskinesia of esophagus: Secondary | ICD-10-CM | POA: Diagnosis not present

## 2019-10-31 DIAGNOSIS — J302 Other seasonal allergic rhinitis: Secondary | ICD-10-CM | POA: Diagnosis not present

## 2019-10-31 DIAGNOSIS — K5900 Constipation, unspecified: Secondary | ICD-10-CM | POA: Diagnosis not present

## 2019-10-31 DIAGNOSIS — K219 Gastro-esophageal reflux disease without esophagitis: Secondary | ICD-10-CM | POA: Diagnosis not present

## 2019-10-31 DIAGNOSIS — E538 Deficiency of other specified B group vitamins: Secondary | ICD-10-CM | POA: Diagnosis not present

## 2019-10-31 DIAGNOSIS — Z79891 Long term (current) use of opiate analgesic: Secondary | ICD-10-CM | POA: Diagnosis not present

## 2019-10-31 DIAGNOSIS — Z8616 Personal history of COVID-19: Secondary | ICD-10-CM | POA: Diagnosis not present

## 2019-10-31 DIAGNOSIS — Z03818 Encounter for observation for suspected exposure to other biological agents ruled out: Secondary | ICD-10-CM | POA: Diagnosis not present

## 2019-10-31 DIAGNOSIS — N1832 Chronic kidney disease, stage 3b: Secondary | ICD-10-CM | POA: Diagnosis not present

## 2019-10-31 DIAGNOSIS — E785 Hyperlipidemia, unspecified: Secondary | ICD-10-CM | POA: Diagnosis not present

## 2019-10-31 DIAGNOSIS — G8929 Other chronic pain: Secondary | ICD-10-CM | POA: Diagnosis not present

## 2019-10-31 DIAGNOSIS — D509 Iron deficiency anemia, unspecified: Secondary | ICD-10-CM | POA: Diagnosis not present

## 2019-10-31 DIAGNOSIS — E039 Hypothyroidism, unspecified: Secondary | ICD-10-CM | POA: Diagnosis not present

## 2019-10-31 DIAGNOSIS — I130 Hypertensive heart and chronic kidney disease with heart failure and stage 1 through stage 4 chronic kidney disease, or unspecified chronic kidney disease: Secondary | ICD-10-CM | POA: Diagnosis not present

## 2019-10-31 DIAGNOSIS — M80052D Age-related osteoporosis with current pathological fracture, left femur, subsequent encounter for fracture with routine healing: Secondary | ICD-10-CM | POA: Diagnosis not present

## 2019-10-31 DIAGNOSIS — E1151 Type 2 diabetes mellitus with diabetic peripheral angiopathy without gangrene: Secondary | ICD-10-CM | POA: Diagnosis not present

## 2019-10-31 DIAGNOSIS — I4819 Other persistent atrial fibrillation: Secondary | ICD-10-CM | POA: Diagnosis not present

## 2019-11-03 DIAGNOSIS — I130 Hypertensive heart and chronic kidney disease with heart failure and stage 1 through stage 4 chronic kidney disease, or unspecified chronic kidney disease: Secondary | ICD-10-CM | POA: Diagnosis not present

## 2019-11-03 DIAGNOSIS — F5101 Primary insomnia: Secondary | ICD-10-CM | POA: Diagnosis not present

## 2019-11-03 DIAGNOSIS — E538 Deficiency of other specified B group vitamins: Secondary | ICD-10-CM | POA: Diagnosis not present

## 2019-11-03 DIAGNOSIS — Z8616 Personal history of COVID-19: Secondary | ICD-10-CM | POA: Diagnosis not present

## 2019-11-03 DIAGNOSIS — K219 Gastro-esophageal reflux disease without esophagitis: Secondary | ICD-10-CM | POA: Diagnosis not present

## 2019-11-03 DIAGNOSIS — J302 Other seasonal allergic rhinitis: Secondary | ICD-10-CM | POA: Diagnosis not present

## 2019-11-03 DIAGNOSIS — Z79891 Long term (current) use of opiate analgesic: Secondary | ICD-10-CM | POA: Diagnosis not present

## 2019-11-03 DIAGNOSIS — M80052D Age-related osteoporosis with current pathological fracture, left femur, subsequent encounter for fracture with routine healing: Secondary | ICD-10-CM | POA: Diagnosis not present

## 2019-11-03 DIAGNOSIS — K5900 Constipation, unspecified: Secondary | ICD-10-CM | POA: Diagnosis not present

## 2019-11-03 DIAGNOSIS — E039 Hypothyroidism, unspecified: Secondary | ICD-10-CM | POA: Diagnosis not present

## 2019-11-03 DIAGNOSIS — I4819 Other persistent atrial fibrillation: Secondary | ICD-10-CM | POA: Diagnosis not present

## 2019-11-03 DIAGNOSIS — R1314 Dysphagia, pharyngoesophageal phase: Secondary | ICD-10-CM | POA: Diagnosis not present

## 2019-11-03 DIAGNOSIS — E785 Hyperlipidemia, unspecified: Secondary | ICD-10-CM | POA: Diagnosis not present

## 2019-11-03 DIAGNOSIS — K224 Dyskinesia of esophagus: Secondary | ICD-10-CM | POA: Diagnosis not present

## 2019-11-03 DIAGNOSIS — D509 Iron deficiency anemia, unspecified: Secondary | ICD-10-CM | POA: Diagnosis not present

## 2019-11-03 DIAGNOSIS — E1122 Type 2 diabetes mellitus with diabetic chronic kidney disease: Secondary | ICD-10-CM | POA: Diagnosis not present

## 2019-11-03 DIAGNOSIS — I5032 Chronic diastolic (congestive) heart failure: Secondary | ICD-10-CM | POA: Diagnosis not present

## 2019-11-03 DIAGNOSIS — N1832 Chronic kidney disease, stage 3b: Secondary | ICD-10-CM | POA: Diagnosis not present

## 2019-11-03 DIAGNOSIS — Z7901 Long term (current) use of anticoagulants: Secondary | ICD-10-CM | POA: Diagnosis not present

## 2019-11-03 DIAGNOSIS — Z03818 Encounter for observation for suspected exposure to other biological agents ruled out: Secondary | ICD-10-CM | POA: Diagnosis not present

## 2019-11-03 DIAGNOSIS — G8929 Other chronic pain: Secondary | ICD-10-CM | POA: Diagnosis not present

## 2019-11-03 DIAGNOSIS — E1151 Type 2 diabetes mellitus with diabetic peripheral angiopathy without gangrene: Secondary | ICD-10-CM | POA: Diagnosis not present

## 2019-11-07 DIAGNOSIS — I130 Hypertensive heart and chronic kidney disease with heart failure and stage 1 through stage 4 chronic kidney disease, or unspecified chronic kidney disease: Secondary | ICD-10-CM | POA: Diagnosis not present

## 2019-11-07 DIAGNOSIS — I5032 Chronic diastolic (congestive) heart failure: Secondary | ICD-10-CM | POA: Diagnosis not present

## 2019-11-07 DIAGNOSIS — G8929 Other chronic pain: Secondary | ICD-10-CM | POA: Diagnosis not present

## 2019-11-07 DIAGNOSIS — Z7901 Long term (current) use of anticoagulants: Secondary | ICD-10-CM | POA: Diagnosis not present

## 2019-11-07 DIAGNOSIS — Z8616 Personal history of COVID-19: Secondary | ICD-10-CM | POA: Diagnosis not present

## 2019-11-07 DIAGNOSIS — J302 Other seasonal allergic rhinitis: Secondary | ICD-10-CM | POA: Diagnosis not present

## 2019-11-07 DIAGNOSIS — E538 Deficiency of other specified B group vitamins: Secondary | ICD-10-CM | POA: Diagnosis not present

## 2019-11-07 DIAGNOSIS — E785 Hyperlipidemia, unspecified: Secondary | ICD-10-CM | POA: Diagnosis not present

## 2019-11-07 DIAGNOSIS — F5101 Primary insomnia: Secondary | ICD-10-CM | POA: Diagnosis not present

## 2019-11-07 DIAGNOSIS — N1832 Chronic kidney disease, stage 3b: Secondary | ICD-10-CM | POA: Diagnosis not present

## 2019-11-07 DIAGNOSIS — R1314 Dysphagia, pharyngoesophageal phase: Secondary | ICD-10-CM | POA: Diagnosis not present

## 2019-11-07 DIAGNOSIS — K5900 Constipation, unspecified: Secondary | ICD-10-CM | POA: Diagnosis not present

## 2019-11-07 DIAGNOSIS — E1151 Type 2 diabetes mellitus with diabetic peripheral angiopathy without gangrene: Secondary | ICD-10-CM | POA: Diagnosis not present

## 2019-11-07 DIAGNOSIS — E039 Hypothyroidism, unspecified: Secondary | ICD-10-CM | POA: Diagnosis not present

## 2019-11-07 DIAGNOSIS — E1122 Type 2 diabetes mellitus with diabetic chronic kidney disease: Secondary | ICD-10-CM | POA: Diagnosis not present

## 2019-11-07 DIAGNOSIS — K224 Dyskinesia of esophagus: Secondary | ICD-10-CM | POA: Diagnosis not present

## 2019-11-07 DIAGNOSIS — K219 Gastro-esophageal reflux disease without esophagitis: Secondary | ICD-10-CM | POA: Diagnosis not present

## 2019-11-07 DIAGNOSIS — Z03818 Encounter for observation for suspected exposure to other biological agents ruled out: Secondary | ICD-10-CM | POA: Diagnosis not present

## 2019-11-07 DIAGNOSIS — Z79891 Long term (current) use of opiate analgesic: Secondary | ICD-10-CM | POA: Diagnosis not present

## 2019-11-07 DIAGNOSIS — M80052D Age-related osteoporosis with current pathological fracture, left femur, subsequent encounter for fracture with routine healing: Secondary | ICD-10-CM | POA: Diagnosis not present

## 2019-11-07 DIAGNOSIS — I4819 Other persistent atrial fibrillation: Secondary | ICD-10-CM | POA: Diagnosis not present

## 2019-11-07 DIAGNOSIS — D509 Iron deficiency anemia, unspecified: Secondary | ICD-10-CM | POA: Diagnosis not present

## 2019-11-10 DIAGNOSIS — Z7901 Long term (current) use of anticoagulants: Secondary | ICD-10-CM | POA: Diagnosis not present

## 2019-11-10 DIAGNOSIS — R1314 Dysphagia, pharyngoesophageal phase: Secondary | ICD-10-CM | POA: Diagnosis not present

## 2019-11-10 DIAGNOSIS — N1832 Chronic kidney disease, stage 3b: Secondary | ICD-10-CM | POA: Diagnosis not present

## 2019-11-10 DIAGNOSIS — Z8616 Personal history of COVID-19: Secondary | ICD-10-CM | POA: Diagnosis not present

## 2019-11-10 DIAGNOSIS — E538 Deficiency of other specified B group vitamins: Secondary | ICD-10-CM | POA: Diagnosis not present

## 2019-11-10 DIAGNOSIS — E785 Hyperlipidemia, unspecified: Secondary | ICD-10-CM | POA: Diagnosis not present

## 2019-11-10 DIAGNOSIS — E039 Hypothyroidism, unspecified: Secondary | ICD-10-CM | POA: Diagnosis not present

## 2019-11-10 DIAGNOSIS — Z79891 Long term (current) use of opiate analgesic: Secondary | ICD-10-CM | POA: Diagnosis not present

## 2019-11-10 DIAGNOSIS — I130 Hypertensive heart and chronic kidney disease with heart failure and stage 1 through stage 4 chronic kidney disease, or unspecified chronic kidney disease: Secondary | ICD-10-CM | POA: Diagnosis not present

## 2019-11-10 DIAGNOSIS — M80052D Age-related osteoporosis with current pathological fracture, left femur, subsequent encounter for fracture with routine healing: Secondary | ICD-10-CM | POA: Diagnosis not present

## 2019-11-10 DIAGNOSIS — J302 Other seasonal allergic rhinitis: Secondary | ICD-10-CM | POA: Diagnosis not present

## 2019-11-10 DIAGNOSIS — E1151 Type 2 diabetes mellitus with diabetic peripheral angiopathy without gangrene: Secondary | ICD-10-CM | POA: Diagnosis not present

## 2019-11-10 DIAGNOSIS — I5032 Chronic diastolic (congestive) heart failure: Secondary | ICD-10-CM | POA: Diagnosis not present

## 2019-11-10 DIAGNOSIS — I4819 Other persistent atrial fibrillation: Secondary | ICD-10-CM | POA: Diagnosis not present

## 2019-11-10 DIAGNOSIS — D509 Iron deficiency anemia, unspecified: Secondary | ICD-10-CM | POA: Diagnosis not present

## 2019-11-10 DIAGNOSIS — G8929 Other chronic pain: Secondary | ICD-10-CM | POA: Diagnosis not present

## 2019-11-10 DIAGNOSIS — F5101 Primary insomnia: Secondary | ICD-10-CM | POA: Diagnosis not present

## 2019-11-10 DIAGNOSIS — E1122 Type 2 diabetes mellitus with diabetic chronic kidney disease: Secondary | ICD-10-CM | POA: Diagnosis not present

## 2019-11-10 DIAGNOSIS — K5900 Constipation, unspecified: Secondary | ICD-10-CM | POA: Diagnosis not present

## 2019-11-10 DIAGNOSIS — K219 Gastro-esophageal reflux disease without esophagitis: Secondary | ICD-10-CM | POA: Diagnosis not present

## 2019-11-10 DIAGNOSIS — Z03818 Encounter for observation for suspected exposure to other biological agents ruled out: Secondary | ICD-10-CM | POA: Diagnosis not present

## 2019-11-10 DIAGNOSIS — K224 Dyskinesia of esophagus: Secondary | ICD-10-CM | POA: Diagnosis not present

## 2019-11-11 DIAGNOSIS — E1151 Type 2 diabetes mellitus with diabetic peripheral angiopathy without gangrene: Secondary | ICD-10-CM | POA: Diagnosis not present

## 2019-11-11 DIAGNOSIS — E039 Hypothyroidism, unspecified: Secondary | ICD-10-CM | POA: Diagnosis not present

## 2019-11-11 DIAGNOSIS — K224 Dyskinesia of esophagus: Secondary | ICD-10-CM | POA: Diagnosis not present

## 2019-11-11 DIAGNOSIS — J302 Other seasonal allergic rhinitis: Secondary | ICD-10-CM | POA: Diagnosis not present

## 2019-11-11 DIAGNOSIS — Z79891 Long term (current) use of opiate analgesic: Secondary | ICD-10-CM | POA: Diagnosis not present

## 2019-11-11 DIAGNOSIS — I5032 Chronic diastolic (congestive) heart failure: Secondary | ICD-10-CM | POA: Diagnosis not present

## 2019-11-11 DIAGNOSIS — R1314 Dysphagia, pharyngoesophageal phase: Secondary | ICD-10-CM | POA: Diagnosis not present

## 2019-11-11 DIAGNOSIS — M80052D Age-related osteoporosis with current pathological fracture, left femur, subsequent encounter for fracture with routine healing: Secondary | ICD-10-CM | POA: Diagnosis not present

## 2019-11-11 DIAGNOSIS — E785 Hyperlipidemia, unspecified: Secondary | ICD-10-CM | POA: Diagnosis not present

## 2019-11-11 DIAGNOSIS — I4819 Other persistent atrial fibrillation: Secondary | ICD-10-CM | POA: Diagnosis not present

## 2019-11-11 DIAGNOSIS — E538 Deficiency of other specified B group vitamins: Secondary | ICD-10-CM | POA: Diagnosis not present

## 2019-11-11 DIAGNOSIS — Z7901 Long term (current) use of anticoagulants: Secondary | ICD-10-CM | POA: Diagnosis not present

## 2019-11-11 DIAGNOSIS — F5101 Primary insomnia: Secondary | ICD-10-CM | POA: Diagnosis not present

## 2019-11-11 DIAGNOSIS — K5900 Constipation, unspecified: Secondary | ICD-10-CM | POA: Diagnosis not present

## 2019-11-11 DIAGNOSIS — D509 Iron deficiency anemia, unspecified: Secondary | ICD-10-CM | POA: Diagnosis not present

## 2019-11-11 DIAGNOSIS — K219 Gastro-esophageal reflux disease without esophagitis: Secondary | ICD-10-CM | POA: Diagnosis not present

## 2019-11-11 DIAGNOSIS — G8929 Other chronic pain: Secondary | ICD-10-CM | POA: Diagnosis not present

## 2019-11-11 DIAGNOSIS — I130 Hypertensive heart and chronic kidney disease with heart failure and stage 1 through stage 4 chronic kidney disease, or unspecified chronic kidney disease: Secondary | ICD-10-CM | POA: Diagnosis not present

## 2019-11-11 DIAGNOSIS — E1122 Type 2 diabetes mellitus with diabetic chronic kidney disease: Secondary | ICD-10-CM | POA: Diagnosis not present

## 2019-11-11 DIAGNOSIS — Z8616 Personal history of COVID-19: Secondary | ICD-10-CM | POA: Diagnosis not present

## 2019-11-11 DIAGNOSIS — N1832 Chronic kidney disease, stage 3b: Secondary | ICD-10-CM | POA: Diagnosis not present

## 2019-11-11 DIAGNOSIS — Z03818 Encounter for observation for suspected exposure to other biological agents ruled out: Secondary | ICD-10-CM | POA: Diagnosis not present

## 2019-11-14 ENCOUNTER — Telehealth: Payer: Self-pay | Admitting: Nurse Practitioner

## 2019-11-14 DIAGNOSIS — I4819 Other persistent atrial fibrillation: Secondary | ICD-10-CM | POA: Diagnosis not present

## 2019-11-14 DIAGNOSIS — Z79891 Long term (current) use of opiate analgesic: Secondary | ICD-10-CM | POA: Diagnosis not present

## 2019-11-14 DIAGNOSIS — I130 Hypertensive heart and chronic kidney disease with heart failure and stage 1 through stage 4 chronic kidney disease, or unspecified chronic kidney disease: Secondary | ICD-10-CM | POA: Diagnosis not present

## 2019-11-14 DIAGNOSIS — Z7901 Long term (current) use of anticoagulants: Secondary | ICD-10-CM | POA: Diagnosis not present

## 2019-11-14 DIAGNOSIS — E1151 Type 2 diabetes mellitus with diabetic peripheral angiopathy without gangrene: Secondary | ICD-10-CM | POA: Diagnosis not present

## 2019-11-14 DIAGNOSIS — E785 Hyperlipidemia, unspecified: Secondary | ICD-10-CM | POA: Diagnosis not present

## 2019-11-14 DIAGNOSIS — M80052D Age-related osteoporosis with current pathological fracture, left femur, subsequent encounter for fracture with routine healing: Secondary | ICD-10-CM | POA: Diagnosis not present

## 2019-11-14 DIAGNOSIS — I10 Essential (primary) hypertension: Secondary | ICD-10-CM

## 2019-11-14 DIAGNOSIS — F5101 Primary insomnia: Secondary | ICD-10-CM | POA: Diagnosis not present

## 2019-11-14 DIAGNOSIS — J302 Other seasonal allergic rhinitis: Secondary | ICD-10-CM | POA: Diagnosis not present

## 2019-11-14 DIAGNOSIS — Z8616 Personal history of COVID-19: Secondary | ICD-10-CM | POA: Diagnosis not present

## 2019-11-14 DIAGNOSIS — K224 Dyskinesia of esophagus: Secondary | ICD-10-CM | POA: Diagnosis not present

## 2019-11-14 DIAGNOSIS — K5900 Constipation, unspecified: Secondary | ICD-10-CM | POA: Diagnosis not present

## 2019-11-14 DIAGNOSIS — Z03818 Encounter for observation for suspected exposure to other biological agents ruled out: Secondary | ICD-10-CM | POA: Diagnosis not present

## 2019-11-14 DIAGNOSIS — E1122 Type 2 diabetes mellitus with diabetic chronic kidney disease: Secondary | ICD-10-CM | POA: Diagnosis not present

## 2019-11-14 DIAGNOSIS — G8929 Other chronic pain: Secondary | ICD-10-CM | POA: Diagnosis not present

## 2019-11-14 DIAGNOSIS — R1314 Dysphagia, pharyngoesophageal phase: Secondary | ICD-10-CM | POA: Diagnosis not present

## 2019-11-14 DIAGNOSIS — E538 Deficiency of other specified B group vitamins: Secondary | ICD-10-CM | POA: Diagnosis not present

## 2019-11-14 DIAGNOSIS — D509 Iron deficiency anemia, unspecified: Secondary | ICD-10-CM | POA: Diagnosis not present

## 2019-11-14 DIAGNOSIS — K219 Gastro-esophageal reflux disease without esophagitis: Secondary | ICD-10-CM | POA: Diagnosis not present

## 2019-11-14 DIAGNOSIS — E782 Mixed hyperlipidemia: Secondary | ICD-10-CM

## 2019-11-14 DIAGNOSIS — N1832 Chronic kidney disease, stage 3b: Secondary | ICD-10-CM | POA: Diagnosis not present

## 2019-11-14 DIAGNOSIS — I5032 Chronic diastolic (congestive) heart failure: Secondary | ICD-10-CM | POA: Diagnosis not present

## 2019-11-14 DIAGNOSIS — E039 Hypothyroidism, unspecified: Secondary | ICD-10-CM | POA: Diagnosis not present

## 2019-11-14 MED ORDER — LOVASTATIN 40 MG PO TABS: 40.0000 mg | ORAL_TABLET | Freq: Every day | ORAL | 0 refills | Status: DC

## 2019-11-14 MED ORDER — CLONIDINE HCL 0.1 MG PO TABS: 0.1000 mg | ORAL_TABLET | Freq: Every day | ORAL | 0 refills | Status: DC

## 2019-11-14 MED ORDER — BENAZEPRIL HCL 5 MG PO TABS: 5.0000 mg | ORAL_TABLET | Freq: Every day | ORAL | 0 refills | Status: DC

## 2019-11-14 NOTE — Telephone Encounter (Signed)
  Prescription Request  11/14/2019  What is the name of the medication or equipment?  Clonidine, Lovastatin, Benazepril  Have you contacted your pharmacy to request a refill? (if applicable) No  Which pharmacy would you like this sent to? [facility]  Patient notified that their request is being sent to the clinical staff for review and that they should receive a response within 2 business days.   Fax# [phone]  MMM's pt.  Please call her bc dosage has changed, since she is been to [place] where her son lives.

## 2019-11-14 NOTE — Telephone Encounter (Signed)
Pt aware refills sent to pharmacy 

## 2019-11-17 DIAGNOSIS — Z8616 Personal history of COVID-19: Secondary | ICD-10-CM | POA: Diagnosis not present

## 2019-11-17 DIAGNOSIS — Z79891 Long term (current) use of opiate analgesic: Secondary | ICD-10-CM | POA: Diagnosis not present

## 2019-11-17 DIAGNOSIS — E1122 Type 2 diabetes mellitus with diabetic chronic kidney disease: Secondary | ICD-10-CM | POA: Diagnosis not present

## 2019-11-17 DIAGNOSIS — E785 Hyperlipidemia, unspecified: Secondary | ICD-10-CM | POA: Diagnosis not present

## 2019-11-17 DIAGNOSIS — K224 Dyskinesia of esophagus: Secondary | ICD-10-CM | POA: Diagnosis not present

## 2019-11-17 DIAGNOSIS — E1151 Type 2 diabetes mellitus with diabetic peripheral angiopathy without gangrene: Secondary | ICD-10-CM | POA: Diagnosis not present

## 2019-11-17 DIAGNOSIS — Z03818 Encounter for observation for suspected exposure to other biological agents ruled out: Secondary | ICD-10-CM | POA: Diagnosis not present

## 2019-11-17 DIAGNOSIS — I5032 Chronic diastolic (congestive) heart failure: Secondary | ICD-10-CM | POA: Diagnosis not present

## 2019-11-17 DIAGNOSIS — D509 Iron deficiency anemia, unspecified: Secondary | ICD-10-CM | POA: Diagnosis not present

## 2019-11-17 DIAGNOSIS — N1832 Chronic kidney disease, stage 3b: Secondary | ICD-10-CM | POA: Diagnosis not present

## 2019-11-17 DIAGNOSIS — J302 Other seasonal allergic rhinitis: Secondary | ICD-10-CM | POA: Diagnosis not present

## 2019-11-17 DIAGNOSIS — E538 Deficiency of other specified B group vitamins: Secondary | ICD-10-CM | POA: Diagnosis not present

## 2019-11-17 DIAGNOSIS — I4819 Other persistent atrial fibrillation: Secondary | ICD-10-CM | POA: Diagnosis not present

## 2019-11-17 DIAGNOSIS — M80052D Age-related osteoporosis with current pathological fracture, left femur, subsequent encounter for fracture with routine healing: Secondary | ICD-10-CM | POA: Diagnosis not present

## 2019-11-17 DIAGNOSIS — I130 Hypertensive heart and chronic kidney disease with heart failure and stage 1 through stage 4 chronic kidney disease, or unspecified chronic kidney disease: Secondary | ICD-10-CM | POA: Diagnosis not present

## 2019-11-17 DIAGNOSIS — F5101 Primary insomnia: Secondary | ICD-10-CM | POA: Diagnosis not present

## 2019-11-17 DIAGNOSIS — Z7901 Long term (current) use of anticoagulants: Secondary | ICD-10-CM | POA: Diagnosis not present

## 2019-11-17 DIAGNOSIS — E039 Hypothyroidism, unspecified: Secondary | ICD-10-CM | POA: Diagnosis not present

## 2019-11-17 DIAGNOSIS — R1314 Dysphagia, pharyngoesophageal phase: Secondary | ICD-10-CM | POA: Diagnosis not present

## 2019-11-17 DIAGNOSIS — K5900 Constipation, unspecified: Secondary | ICD-10-CM | POA: Diagnosis not present

## 2019-11-17 DIAGNOSIS — G8929 Other chronic pain: Secondary | ICD-10-CM | POA: Diagnosis not present

## 2019-11-17 DIAGNOSIS — K219 Gastro-esophageal reflux disease without esophagitis: Secondary | ICD-10-CM | POA: Diagnosis not present

## 2019-11-19 DIAGNOSIS — S72142A Displaced intertrochanteric fracture of left femur, initial encounter for closed fracture: Secondary | ICD-10-CM | POA: Diagnosis not present

## 2019-11-19 DIAGNOSIS — S72402A Unspecified fracture of lower end of left femur, initial encounter for closed fracture: Secondary | ICD-10-CM | POA: Diagnosis not present

## 2019-11-19 DIAGNOSIS — Z0389 Encounter for observation for other suspected diseases and conditions ruled out: Secondary | ICD-10-CM | POA: Diagnosis not present

## 2019-11-19 DIAGNOSIS — N183 Chronic kidney disease, stage 3 unspecified: Secondary | ICD-10-CM | POA: Diagnosis not present

## 2019-11-19 DIAGNOSIS — E785 Hyperlipidemia, unspecified: Secondary | ICD-10-CM | POA: Diagnosis not present

## 2019-11-19 DIAGNOSIS — I4819 Other persistent atrial fibrillation: Secondary | ICD-10-CM | POA: Diagnosis not present

## 2019-11-19 DIAGNOSIS — M1712 Unilateral primary osteoarthritis, left knee: Secondary | ICD-10-CM | POA: Diagnosis not present

## 2019-11-19 DIAGNOSIS — I130 Hypertensive heart and chronic kidney disease with heart failure and stage 1 through stage 4 chronic kidney disease, or unspecified chronic kidney disease: Secondary | ICD-10-CM | POA: Diagnosis not present

## 2019-11-19 DIAGNOSIS — M25562 Pain in left knee: Secondary | ICD-10-CM | POA: Diagnosis not present

## 2019-11-19 DIAGNOSIS — I5032 Chronic diastolic (congestive) heart failure: Secondary | ICD-10-CM | POA: Diagnosis not present

## 2019-11-19 DIAGNOSIS — M25569 Pain in unspecified knee: Secondary | ICD-10-CM | POA: Diagnosis not present

## 2019-11-19 DIAGNOSIS — S7222XA Displaced subtrochanteric fracture of left femur, initial encounter for closed fracture: Secondary | ICD-10-CM | POA: Diagnosis not present

## 2019-11-19 DIAGNOSIS — E1122 Type 2 diabetes mellitus with diabetic chronic kidney disease: Secondary | ICD-10-CM | POA: Diagnosis not present

## 2019-11-21 ENCOUNTER — Encounter: Payer: Medicare Other | Admitting: Physician Assistant

## 2019-11-21 DIAGNOSIS — E1122 Type 2 diabetes mellitus with diabetic chronic kidney disease: Secondary | ICD-10-CM | POA: Diagnosis not present

## 2019-11-21 DIAGNOSIS — K219 Gastro-esophageal reflux disease without esophagitis: Secondary | ICD-10-CM | POA: Diagnosis not present

## 2019-11-21 DIAGNOSIS — Z8616 Personal history of COVID-19: Secondary | ICD-10-CM | POA: Diagnosis not present

## 2019-11-21 DIAGNOSIS — E039 Hypothyroidism, unspecified: Secondary | ICD-10-CM | POA: Diagnosis not present

## 2019-11-21 DIAGNOSIS — Z1159 Encounter for screening for other viral diseases: Secondary | ICD-10-CM | POA: Diagnosis not present

## 2019-11-21 DIAGNOSIS — M80052D Age-related osteoporosis with current pathological fracture, left femur, subsequent encounter for fracture with routine healing: Secondary | ICD-10-CM | POA: Diagnosis not present

## 2019-11-21 DIAGNOSIS — Z7901 Long term (current) use of anticoagulants: Secondary | ICD-10-CM | POA: Diagnosis not present

## 2019-11-21 DIAGNOSIS — E538 Deficiency of other specified B group vitamins: Secondary | ICD-10-CM | POA: Diagnosis not present

## 2019-11-21 DIAGNOSIS — G8929 Other chronic pain: Secondary | ICD-10-CM | POA: Diagnosis not present

## 2019-11-21 DIAGNOSIS — R1314 Dysphagia, pharyngoesophageal phase: Secondary | ICD-10-CM | POA: Diagnosis not present

## 2019-11-21 DIAGNOSIS — E119 Type 2 diabetes mellitus without complications: Secondary | ICD-10-CM | POA: Diagnosis not present

## 2019-11-21 DIAGNOSIS — Z03818 Encounter for observation for suspected exposure to other biological agents ruled out: Secondary | ICD-10-CM | POA: Diagnosis not present

## 2019-11-21 DIAGNOSIS — E1151 Type 2 diabetes mellitus with diabetic peripheral angiopathy without gangrene: Secondary | ICD-10-CM | POA: Diagnosis not present

## 2019-11-21 DIAGNOSIS — I10 Essential (primary) hypertension: Secondary | ICD-10-CM | POA: Diagnosis not present

## 2019-11-21 DIAGNOSIS — Z79891 Long term (current) use of opiate analgesic: Secondary | ICD-10-CM | POA: Diagnosis not present

## 2019-11-21 DIAGNOSIS — D509 Iron deficiency anemia, unspecified: Secondary | ICD-10-CM | POA: Diagnosis not present

## 2019-11-21 DIAGNOSIS — R262 Difficulty in walking, not elsewhere classified: Secondary | ICD-10-CM | POA: Diagnosis not present

## 2019-11-21 DIAGNOSIS — J302 Other seasonal allergic rhinitis: Secondary | ICD-10-CM | POA: Diagnosis not present

## 2019-11-21 DIAGNOSIS — M79605 Pain in left leg: Secondary | ICD-10-CM | POA: Diagnosis not present

## 2019-11-21 DIAGNOSIS — K224 Dyskinesia of esophagus: Secondary | ICD-10-CM | POA: Diagnosis not present

## 2019-11-21 DIAGNOSIS — M25562 Pain in left knee: Secondary | ICD-10-CM | POA: Diagnosis not present

## 2019-11-21 DIAGNOSIS — I4819 Other persistent atrial fibrillation: Secondary | ICD-10-CM | POA: Diagnosis not present

## 2019-11-21 DIAGNOSIS — I130 Hypertensive heart and chronic kidney disease with heart failure and stage 1 through stage 4 chronic kidney disease, or unspecified chronic kidney disease: Secondary | ICD-10-CM | POA: Diagnosis not present

## 2019-11-21 DIAGNOSIS — E785 Hyperlipidemia, unspecified: Secondary | ICD-10-CM | POA: Diagnosis not present

## 2019-11-21 DIAGNOSIS — M25569 Pain in unspecified knee: Secondary | ICD-10-CM | POA: Insufficient documentation

## 2019-11-21 DIAGNOSIS — M84452G Pathological fracture, left femur, subsequent encounter for fracture with delayed healing: Secondary | ICD-10-CM | POA: Diagnosis not present

## 2019-11-21 DIAGNOSIS — I5032 Chronic diastolic (congestive) heart failure: Secondary | ICD-10-CM | POA: Diagnosis not present

## 2019-11-21 DIAGNOSIS — N1832 Chronic kidney disease, stage 3b: Secondary | ICD-10-CM | POA: Diagnosis not present

## 2019-11-21 DIAGNOSIS — T84418A Breakdown (mechanical) of other internal orthopedic devices, implants and grafts, initial encounter: Secondary | ICD-10-CM | POA: Diagnosis not present

## 2019-11-21 DIAGNOSIS — K5900 Constipation, unspecified: Secondary | ICD-10-CM | POA: Diagnosis not present

## 2019-11-21 DIAGNOSIS — Z79899 Other long term (current) drug therapy: Secondary | ICD-10-CM | POA: Diagnosis not present

## 2019-11-21 DIAGNOSIS — Z20822 Contact with and (suspected) exposure to covid-19: Secondary | ICD-10-CM | POA: Diagnosis not present

## 2019-11-21 DIAGNOSIS — M85862 Other specified disorders of bone density and structure, left lower leg: Secondary | ICD-10-CM | POA: Diagnosis not present

## 2019-11-21 DIAGNOSIS — F5101 Primary insomnia: Secondary | ICD-10-CM | POA: Diagnosis not present

## 2019-11-21 DIAGNOSIS — M1712 Unilateral primary osteoarthritis, left knee: Secondary | ICD-10-CM | POA: Diagnosis not present

## 2019-11-22 DIAGNOSIS — M1712 Unilateral primary osteoarthritis, left knee: Secondary | ICD-10-CM | POA: Diagnosis not present

## 2019-11-22 DIAGNOSIS — M80852A Other osteoporosis with current pathological fracture, left femur, initial encounter for fracture: Secondary | ICD-10-CM | POA: Diagnosis not present

## 2019-11-22 DIAGNOSIS — T84418A Breakdown (mechanical) of other internal orthopedic devices, implants and grafts, initial encounter: Secondary | ICD-10-CM | POA: Diagnosis not present

## 2019-11-22 MED ORDER — RIVAROXABAN 15 MG PO TABS
15.00 | ORAL_TABLET | ORAL | Status: DC
Start: 2019-11-25 — End: 2019-11-22

## 2019-11-22 MED ORDER — GENERIC EXTERNAL MEDICATION
50.00 | Status: DC
Start: ? — End: 2019-11-22

## 2019-11-22 MED ORDER — LABETALOL HCL 5 MG/ML IV SOLN
10.00 | INTRAVENOUS | Status: DC
Start: ? — End: 2019-11-22

## 2019-11-22 MED ORDER — HYDROCODONE-ACETAMINOPHEN 5-325 MG PO TABS
2.00 | ORAL_TABLET | ORAL | Status: DC
Start: ? — End: 2019-11-22

## 2019-11-22 MED ORDER — GLUCAGON (RDNA) 1 MG IJ KIT
1.00 | PACK | INTRAMUSCULAR | Status: DC
Start: ? — End: 2019-11-22

## 2019-11-22 MED ORDER — ACETAMINOPHEN 325 MG PO TABS
650.00 | ORAL_TABLET | ORAL | Status: DC
Start: ? — End: 2019-11-22

## 2019-11-22 MED ORDER — NITROGLYCERIN 0.4 MG SL SUBL
0.40 | SUBLINGUAL_TABLET | SUBLINGUAL | Status: DC
Start: ? — End: 2019-11-22

## 2019-11-22 MED ORDER — ONDANSETRON HCL 4 MG/2ML IJ SOLN
4.00 | INTRAMUSCULAR | Status: DC
Start: ? — End: 2019-11-22

## 2019-11-22 MED ORDER — RALOXIFENE HCL 60 MG PO TABS
60.00 | ORAL_TABLET | ORAL | Status: DC
Start: 2019-11-26 — End: 2019-11-22

## 2019-11-22 MED ORDER — HYDROXYZINE HCL 25 MG PO TABS
25.00 | ORAL_TABLET | ORAL | Status: DC
Start: ? — End: 2019-11-22

## 2019-11-22 MED ORDER — MORPHINE SULFATE 2 MG/ML IJ SOLN
2.00 | INTRAMUSCULAR | Status: DC
Start: ? — End: 2019-11-22

## 2019-11-22 MED ORDER — DEXTROSE 50 % IV SOLN
50.00 | INTRAVENOUS | Status: DC
Start: ? — End: 2019-11-22

## 2019-11-22 MED ORDER — LEVOTHYROXINE SODIUM 50 MCG PO TABS
50.00 | ORAL_TABLET | ORAL | Status: DC
Start: 2019-11-26 — End: 2019-11-22

## 2019-11-22 MED ORDER — INSULIN LISPRO 100 UNIT/ML ~~LOC~~ SOLN
0.00 | SUBCUTANEOUS | Status: DC
Start: 2019-11-25 — End: 2019-11-22

## 2019-11-22 MED ORDER — BENAZEPRIL HCL 5 MG PO TABS
5.00 | ORAL_TABLET | ORAL | Status: DC
Start: 2019-11-26 — End: 2019-11-22

## 2019-11-22 MED ORDER — GLUCOSE-VITAMIN C 4-6 GM-MG PO CHEW
12.00 | CHEWABLE_TABLET | ORAL | Status: DC
Start: ? — End: 2019-11-22

## 2019-11-22 MED ORDER — GLUCOSE 40 % PO GEL
ORAL | Status: DC
Start: ? — End: 2019-11-22

## 2019-11-22 MED ORDER — LOVASTATIN 20 MG PO TABS
40.00 | ORAL_TABLET | ORAL | Status: DC
Start: 2019-11-25 — End: 2019-11-22

## 2019-11-22 MED ORDER — ALBUTEROL SULFATE HFA 108 (90 BASE) MCG/ACT IN AERS
2.00 | INHALATION_SPRAY | RESPIRATORY_TRACT | Status: DC
Start: ? — End: 2019-11-22

## 2019-11-22 MED ORDER — BISACODYL 5 MG PO TBEC
10.00 | DELAYED_RELEASE_TABLET | ORAL | Status: DC
Start: ? — End: 2019-11-22

## 2019-11-22 MED ORDER — FLUTICASONE PROPIONATE 50 MCG/ACT NA SUSP
2.00 | NASAL | Status: DC
Start: ? — End: 2019-11-22

## 2019-11-22 MED ORDER — CLONIDINE HCL 0.1 MG PO TABS
0.10 | ORAL_TABLET | ORAL | Status: DC
Start: 2019-11-25 — End: 2019-11-22

## 2019-11-22 MED ORDER — CLONAZEPAM 0.5 MG PO TABS
0.25 | ORAL_TABLET | ORAL | Status: DC
Start: 2019-11-25 — End: 2019-11-22

## 2019-11-22 MED ORDER — PANTOPRAZOLE SODIUM 40 MG PO TBEC
40.00 | DELAYED_RELEASE_TABLET | ORAL | Status: DC
Start: 2019-11-26 — End: 2019-11-22

## 2019-11-23 DIAGNOSIS — M1712 Unilateral primary osteoarthritis, left knee: Secondary | ICD-10-CM | POA: Diagnosis not present

## 2019-11-23 DIAGNOSIS — T84418A Breakdown (mechanical) of other internal orthopedic devices, implants and grafts, initial encounter: Secondary | ICD-10-CM | POA: Diagnosis not present

## 2019-11-23 DIAGNOSIS — M25562 Pain in left knee: Secondary | ICD-10-CM | POA: Diagnosis not present

## 2019-11-23 DIAGNOSIS — M80852A Other osteoporosis with current pathological fracture, left femur, initial encounter for fracture: Secondary | ICD-10-CM | POA: Diagnosis not present

## 2019-11-23 DIAGNOSIS — K5900 Constipation, unspecified: Secondary | ICD-10-CM | POA: Diagnosis not present

## 2019-11-23 DIAGNOSIS — I5032 Chronic diastolic (congestive) heart failure: Secondary | ICD-10-CM | POA: Diagnosis not present

## 2019-11-23 DIAGNOSIS — I10 Essential (primary) hypertension: Secondary | ICD-10-CM | POA: Diagnosis not present

## 2019-11-24 DIAGNOSIS — T84418A Breakdown (mechanical) of other internal orthopedic devices, implants and grafts, initial encounter: Secondary | ICD-10-CM | POA: Diagnosis not present

## 2019-11-24 DIAGNOSIS — K5900 Constipation, unspecified: Secondary | ICD-10-CM | POA: Diagnosis not present

## 2019-11-24 DIAGNOSIS — I5032 Chronic diastolic (congestive) heart failure: Secondary | ICD-10-CM | POA: Diagnosis not present

## 2019-11-25 DIAGNOSIS — E119 Type 2 diabetes mellitus without complications: Secondary | ICD-10-CM | POA: Diagnosis not present

## 2019-11-25 DIAGNOSIS — I5032 Chronic diastolic (congestive) heart failure: Secondary | ICD-10-CM | POA: Diagnosis not present

## 2019-11-25 DIAGNOSIS — S7292XA Unspecified fracture of left femur, initial encounter for closed fracture: Secondary | ICD-10-CM | POA: Diagnosis not present

## 2019-11-25 DIAGNOSIS — E039 Hypothyroidism, unspecified: Secondary | ICD-10-CM | POA: Diagnosis not present

## 2019-11-25 DIAGNOSIS — I130 Hypertensive heart and chronic kidney disease with heart failure and stage 1 through stage 4 chronic kidney disease, or unspecified chronic kidney disease: Secondary | ICD-10-CM | POA: Diagnosis not present

## 2019-11-25 DIAGNOSIS — Z7901 Long term (current) use of anticoagulants: Secondary | ICD-10-CM | POA: Diagnosis not present

## 2019-11-25 DIAGNOSIS — E1122 Type 2 diabetes mellitus with diabetic chronic kidney disease: Secondary | ICD-10-CM | POA: Diagnosis not present

## 2019-11-25 DIAGNOSIS — E785 Hyperlipidemia, unspecified: Secondary | ICD-10-CM | POA: Diagnosis not present

## 2019-11-25 DIAGNOSIS — Z79899 Other long term (current) drug therapy: Secondary | ICD-10-CM | POA: Diagnosis not present

## 2019-11-25 DIAGNOSIS — I110 Hypertensive heart disease with heart failure: Secondary | ICD-10-CM | POA: Diagnosis not present

## 2019-11-25 DIAGNOSIS — T84195D Other mechanical complication of internal fixation device of left femur, subsequent encounter: Secondary | ICD-10-CM | POA: Diagnosis not present

## 2019-11-25 DIAGNOSIS — M1712 Unilateral primary osteoarthritis, left knee: Secondary | ICD-10-CM | POA: Diagnosis not present

## 2019-11-25 DIAGNOSIS — Z9181 History of falling: Secondary | ICD-10-CM | POA: Diagnosis not present

## 2019-11-25 DIAGNOSIS — K5900 Constipation, unspecified: Secondary | ICD-10-CM | POA: Diagnosis not present

## 2019-11-25 DIAGNOSIS — K219 Gastro-esophageal reflux disease without esophagitis: Secondary | ICD-10-CM | POA: Diagnosis not present

## 2019-11-25 DIAGNOSIS — N1832 Chronic kidney disease, stage 3b: Secondary | ICD-10-CM | POA: Diagnosis not present

## 2019-11-25 DIAGNOSIS — I4819 Other persistent atrial fibrillation: Secondary | ICD-10-CM | POA: Diagnosis not present

## 2019-11-25 DIAGNOSIS — T84418A Breakdown (mechanical) of other internal orthopedic devices, implants and grafts, initial encounter: Secondary | ICD-10-CM | POA: Diagnosis not present

## 2019-11-25 DIAGNOSIS — Z8616 Personal history of COVID-19: Secondary | ICD-10-CM | POA: Diagnosis not present

## 2019-11-25 DIAGNOSIS — M25562 Pain in left knee: Secondary | ICD-10-CM | POA: Diagnosis not present

## 2019-11-25 DIAGNOSIS — I129 Hypertensive chronic kidney disease with stage 1 through stage 4 chronic kidney disease, or unspecified chronic kidney disease: Secondary | ICD-10-CM | POA: Diagnosis not present

## 2019-11-25 DIAGNOSIS — M84452G Pathological fracture, left femur, subsequent encounter for fracture with delayed healing: Secondary | ICD-10-CM | POA: Diagnosis not present

## 2019-11-25 DIAGNOSIS — R262 Difficulty in walking, not elsewhere classified: Secondary | ICD-10-CM | POA: Diagnosis not present

## 2019-11-26 MED ORDER — POLYETHYLENE GLYCOL 3350 17 GM/SCOOP PO POWD
17.00 | ORAL | Status: DC
Start: 2019-11-26 — End: 2019-11-26

## 2019-11-28 IMAGING — CR CHEST - 2 VIEW
2 series · 2 of 2 positions shown · non-contrast
Comparison: 06/10/2016

CLINICAL DATA: Shortness of breath and palpitations.

EXAM:
CHEST - 2 VIEW

[chest pa]
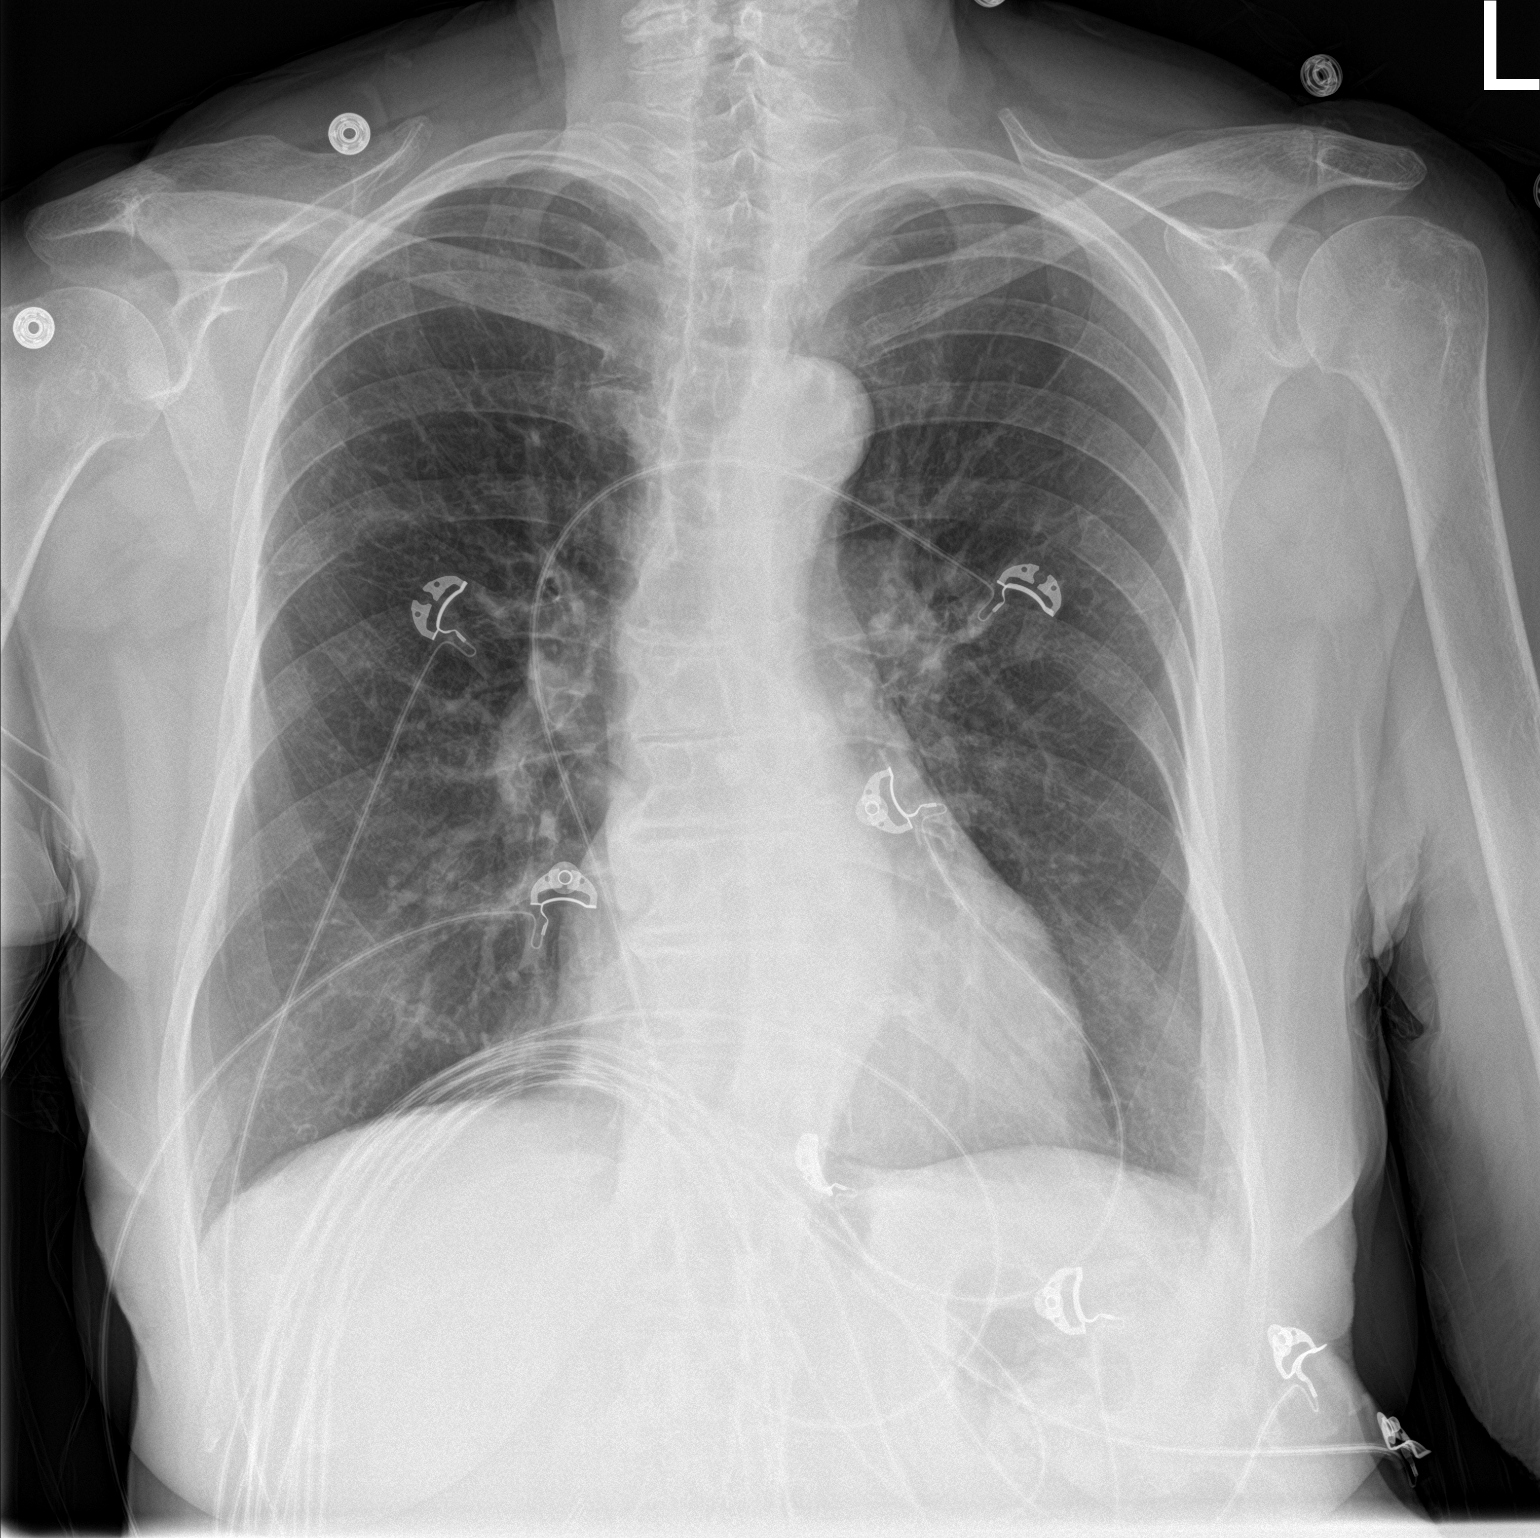

[chest lat]
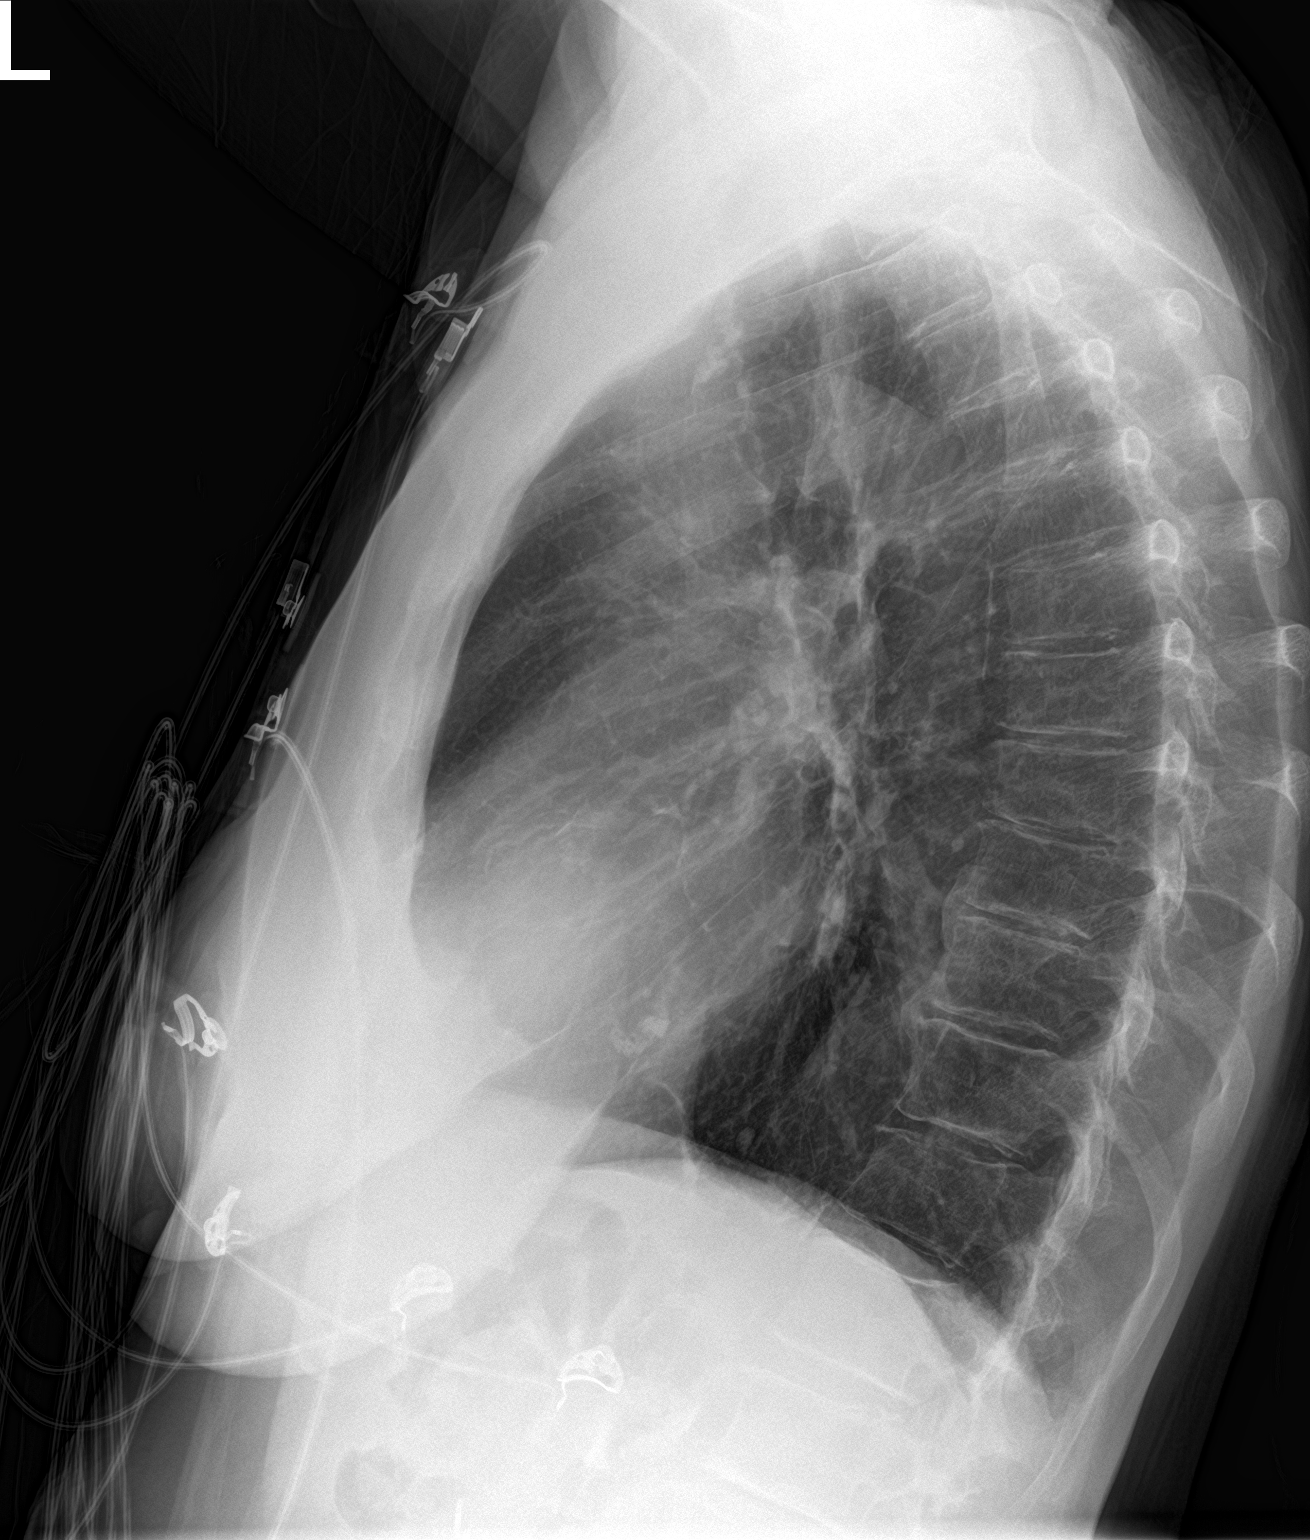

[2 of 2 positions shown; findings below may reference images not displayed]

FINDINGS: The cardiomediastinal silhouette is unchanged with normal heart
size. Thoracic aortic atherosclerosis and tortuosity are noted. The
lungs are well inflated without evidence of airspace consolidation,
edema, pleural effusion, or pneumothorax. No acute osseous
abnormality is seen.
IMPRESSION: No active cardiopulmonary disease.

## 2019-12-01 ENCOUNTER — Ambulatory Visit: Payer: Self-pay | Admitting: Nurse Practitioner

## 2019-12-07 DIAGNOSIS — S7292XA Unspecified fracture of left femur, initial encounter for closed fracture: Secondary | ICD-10-CM | POA: Diagnosis not present

## 2019-12-08 ENCOUNTER — Encounter: Payer: Self-pay | Admitting: Nurse Practitioner

## 2019-12-09 DIAGNOSIS — M80052D Age-related osteoporosis with current pathological fracture, left femur, subsequent encounter for fracture with routine healing: Secondary | ICD-10-CM | POA: Diagnosis not present

## 2019-12-09 DIAGNOSIS — K224 Dyskinesia of esophagus: Secondary | ICD-10-CM | POA: Diagnosis not present

## 2019-12-09 DIAGNOSIS — R1314 Dysphagia, pharyngoesophageal phase: Secondary | ICD-10-CM | POA: Diagnosis not present

## 2019-12-09 DIAGNOSIS — Z8616 Personal history of COVID-19: Secondary | ICD-10-CM | POA: Diagnosis not present

## 2019-12-09 DIAGNOSIS — Z7901 Long term (current) use of anticoagulants: Secondary | ICD-10-CM | POA: Diagnosis not present

## 2019-12-09 DIAGNOSIS — K5900 Constipation, unspecified: Secondary | ICD-10-CM | POA: Diagnosis not present

## 2019-12-09 DIAGNOSIS — I5032 Chronic diastolic (congestive) heart failure: Secondary | ICD-10-CM | POA: Diagnosis not present

## 2019-12-09 DIAGNOSIS — Z03818 Encounter for observation for suspected exposure to other biological agents ruled out: Secondary | ICD-10-CM | POA: Diagnosis not present

## 2019-12-09 DIAGNOSIS — E785 Hyperlipidemia, unspecified: Secondary | ICD-10-CM | POA: Diagnosis not present

## 2019-12-09 DIAGNOSIS — I130 Hypertensive heart and chronic kidney disease with heart failure and stage 1 through stage 4 chronic kidney disease, or unspecified chronic kidney disease: Secondary | ICD-10-CM | POA: Diagnosis not present

## 2019-12-09 DIAGNOSIS — I4819 Other persistent atrial fibrillation: Secondary | ICD-10-CM | POA: Diagnosis not present

## 2019-12-09 DIAGNOSIS — K219 Gastro-esophageal reflux disease without esophagitis: Secondary | ICD-10-CM | POA: Diagnosis not present

## 2019-12-09 DIAGNOSIS — D509 Iron deficiency anemia, unspecified: Secondary | ICD-10-CM | POA: Diagnosis not present

## 2019-12-09 DIAGNOSIS — N1832 Chronic kidney disease, stage 3b: Secondary | ICD-10-CM | POA: Diagnosis not present

## 2019-12-09 DIAGNOSIS — E039 Hypothyroidism, unspecified: Secondary | ICD-10-CM | POA: Diagnosis not present

## 2019-12-09 DIAGNOSIS — F5101 Primary insomnia: Secondary | ICD-10-CM | POA: Diagnosis not present

## 2019-12-09 DIAGNOSIS — G8929 Other chronic pain: Secondary | ICD-10-CM | POA: Diagnosis not present

## 2019-12-09 DIAGNOSIS — J302 Other seasonal allergic rhinitis: Secondary | ICD-10-CM | POA: Diagnosis not present

## 2019-12-09 DIAGNOSIS — E1122 Type 2 diabetes mellitus with diabetic chronic kidney disease: Secondary | ICD-10-CM | POA: Diagnosis not present

## 2019-12-09 DIAGNOSIS — E1151 Type 2 diabetes mellitus with diabetic peripheral angiopathy without gangrene: Secondary | ICD-10-CM | POA: Diagnosis not present

## 2019-12-09 DIAGNOSIS — Z79891 Long term (current) use of opiate analgesic: Secondary | ICD-10-CM | POA: Diagnosis not present

## 2019-12-09 DIAGNOSIS — E538 Deficiency of other specified B group vitamins: Secondary | ICD-10-CM | POA: Diagnosis not present

## 2019-12-12 DIAGNOSIS — E1122 Type 2 diabetes mellitus with diabetic chronic kidney disease: Secondary | ICD-10-CM | POA: Diagnosis not present

## 2019-12-12 DIAGNOSIS — K5900 Constipation, unspecified: Secondary | ICD-10-CM | POA: Diagnosis not present

## 2019-12-12 DIAGNOSIS — G8929 Other chronic pain: Secondary | ICD-10-CM | POA: Diagnosis not present

## 2019-12-12 DIAGNOSIS — Z03818 Encounter for observation for suspected exposure to other biological agents ruled out: Secondary | ICD-10-CM | POA: Diagnosis not present

## 2019-12-12 DIAGNOSIS — M80052D Age-related osteoporosis with current pathological fracture, left femur, subsequent encounter for fracture with routine healing: Secondary | ICD-10-CM | POA: Diagnosis not present

## 2019-12-12 DIAGNOSIS — N1832 Chronic kidney disease, stage 3b: Secondary | ICD-10-CM | POA: Diagnosis not present

## 2019-12-12 DIAGNOSIS — I4819 Other persistent atrial fibrillation: Secondary | ICD-10-CM | POA: Diagnosis not present

## 2019-12-12 DIAGNOSIS — I5032 Chronic diastolic (congestive) heart failure: Secondary | ICD-10-CM | POA: Diagnosis not present

## 2019-12-12 DIAGNOSIS — Z7901 Long term (current) use of anticoagulants: Secondary | ICD-10-CM | POA: Diagnosis not present

## 2019-12-12 DIAGNOSIS — F5101 Primary insomnia: Secondary | ICD-10-CM | POA: Diagnosis not present

## 2019-12-12 DIAGNOSIS — K219 Gastro-esophageal reflux disease without esophagitis: Secondary | ICD-10-CM | POA: Diagnosis not present

## 2019-12-12 DIAGNOSIS — I130 Hypertensive heart and chronic kidney disease with heart failure and stage 1 through stage 4 chronic kidney disease, or unspecified chronic kidney disease: Secondary | ICD-10-CM | POA: Diagnosis not present

## 2019-12-12 DIAGNOSIS — J302 Other seasonal allergic rhinitis: Secondary | ICD-10-CM | POA: Diagnosis not present

## 2019-12-12 DIAGNOSIS — K224 Dyskinesia of esophagus: Secondary | ICD-10-CM | POA: Diagnosis not present

## 2019-12-12 DIAGNOSIS — Z8616 Personal history of COVID-19: Secondary | ICD-10-CM | POA: Diagnosis not present

## 2019-12-12 DIAGNOSIS — E538 Deficiency of other specified B group vitamins: Secondary | ICD-10-CM | POA: Diagnosis not present

## 2019-12-12 DIAGNOSIS — Z79891 Long term (current) use of opiate analgesic: Secondary | ICD-10-CM | POA: Diagnosis not present

## 2019-12-12 DIAGNOSIS — D509 Iron deficiency anemia, unspecified: Secondary | ICD-10-CM | POA: Diagnosis not present

## 2019-12-12 DIAGNOSIS — E1151 Type 2 diabetes mellitus with diabetic peripheral angiopathy without gangrene: Secondary | ICD-10-CM | POA: Diagnosis not present

## 2019-12-12 DIAGNOSIS — R1314 Dysphagia, pharyngoesophageal phase: Secondary | ICD-10-CM | POA: Diagnosis not present

## 2019-12-12 DIAGNOSIS — E039 Hypothyroidism, unspecified: Secondary | ICD-10-CM | POA: Diagnosis not present

## 2019-12-12 DIAGNOSIS — E785 Hyperlipidemia, unspecified: Secondary | ICD-10-CM | POA: Diagnosis not present

## 2019-12-15 ENCOUNTER — Ambulatory Visit: Payer: Medicare Other | Admitting: Nurse Practitioner

## 2019-12-15 DIAGNOSIS — Z03818 Encounter for observation for suspected exposure to other biological agents ruled out: Secondary | ICD-10-CM | POA: Diagnosis not present

## 2019-12-15 DIAGNOSIS — R1314 Dysphagia, pharyngoesophageal phase: Secondary | ICD-10-CM | POA: Diagnosis not present

## 2019-12-15 DIAGNOSIS — G8929 Other chronic pain: Secondary | ICD-10-CM | POA: Diagnosis not present

## 2019-12-15 DIAGNOSIS — K224 Dyskinesia of esophagus: Secondary | ICD-10-CM | POA: Diagnosis not present

## 2019-12-15 DIAGNOSIS — E538 Deficiency of other specified B group vitamins: Secondary | ICD-10-CM | POA: Diagnosis not present

## 2019-12-15 DIAGNOSIS — I5032 Chronic diastolic (congestive) heart failure: Secondary | ICD-10-CM | POA: Diagnosis not present

## 2019-12-15 DIAGNOSIS — K219 Gastro-esophageal reflux disease without esophagitis: Secondary | ICD-10-CM | POA: Diagnosis not present

## 2019-12-15 DIAGNOSIS — I4819 Other persistent atrial fibrillation: Secondary | ICD-10-CM | POA: Diagnosis not present

## 2019-12-15 DIAGNOSIS — Z8616 Personal history of COVID-19: Secondary | ICD-10-CM | POA: Diagnosis not present

## 2019-12-15 DIAGNOSIS — K5900 Constipation, unspecified: Secondary | ICD-10-CM | POA: Diagnosis not present

## 2019-12-15 DIAGNOSIS — N1832 Chronic kidney disease, stage 3b: Secondary | ICD-10-CM | POA: Diagnosis not present

## 2019-12-15 DIAGNOSIS — E1151 Type 2 diabetes mellitus with diabetic peripheral angiopathy without gangrene: Secondary | ICD-10-CM | POA: Diagnosis not present

## 2019-12-15 DIAGNOSIS — E039 Hypothyroidism, unspecified: Secondary | ICD-10-CM | POA: Diagnosis not present

## 2019-12-15 DIAGNOSIS — Z7901 Long term (current) use of anticoagulants: Secondary | ICD-10-CM | POA: Diagnosis not present

## 2019-12-15 DIAGNOSIS — Z79891 Long term (current) use of opiate analgesic: Secondary | ICD-10-CM | POA: Diagnosis not present

## 2019-12-15 DIAGNOSIS — J302 Other seasonal allergic rhinitis: Secondary | ICD-10-CM | POA: Diagnosis not present

## 2019-12-15 DIAGNOSIS — E785 Hyperlipidemia, unspecified: Secondary | ICD-10-CM | POA: Diagnosis not present

## 2019-12-15 DIAGNOSIS — F5101 Primary insomnia: Secondary | ICD-10-CM | POA: Diagnosis not present

## 2019-12-15 DIAGNOSIS — E1122 Type 2 diabetes mellitus with diabetic chronic kidney disease: Secondary | ICD-10-CM | POA: Diagnosis not present

## 2019-12-15 DIAGNOSIS — D509 Iron deficiency anemia, unspecified: Secondary | ICD-10-CM | POA: Diagnosis not present

## 2019-12-15 DIAGNOSIS — I130 Hypertensive heart and chronic kidney disease with heart failure and stage 1 through stage 4 chronic kidney disease, or unspecified chronic kidney disease: Secondary | ICD-10-CM | POA: Diagnosis not present

## 2019-12-15 DIAGNOSIS — M80052D Age-related osteoporosis with current pathological fracture, left femur, subsequent encounter for fracture with routine healing: Secondary | ICD-10-CM | POA: Diagnosis not present

## 2019-12-21 DIAGNOSIS — M1712 Unilateral primary osteoarthritis, left knee: Secondary | ICD-10-CM | POA: Diagnosis not present

## 2019-12-21 DIAGNOSIS — Z8616 Personal history of COVID-19: Secondary | ICD-10-CM | POA: Diagnosis not present

## 2019-12-21 DIAGNOSIS — E1169 Type 2 diabetes mellitus with other specified complication: Secondary | ICD-10-CM | POA: Diagnosis not present

## 2019-12-21 DIAGNOSIS — I130 Hypertensive heart and chronic kidney disease with heart failure and stage 1 through stage 4 chronic kidney disease, or unspecified chronic kidney disease: Secondary | ICD-10-CM | POA: Diagnosis not present

## 2019-12-21 DIAGNOSIS — K219 Gastro-esophageal reflux disease without esophagitis: Secondary | ICD-10-CM | POA: Diagnosis not present

## 2019-12-21 DIAGNOSIS — E1122 Type 2 diabetes mellitus with diabetic chronic kidney disease: Secondary | ICD-10-CM | POA: Diagnosis not present

## 2019-12-21 DIAGNOSIS — R1314 Dysphagia, pharyngoesophageal phase: Secondary | ICD-10-CM | POA: Diagnosis not present

## 2019-12-21 DIAGNOSIS — E538 Deficiency of other specified B group vitamins: Secondary | ICD-10-CM | POA: Diagnosis not present

## 2019-12-21 DIAGNOSIS — H9193 Unspecified hearing loss, bilateral: Secondary | ICD-10-CM | POA: Diagnosis not present

## 2019-12-21 DIAGNOSIS — N1832 Chronic kidney disease, stage 3b: Secondary | ICD-10-CM | POA: Diagnosis not present

## 2019-12-21 DIAGNOSIS — K5900 Constipation, unspecified: Secondary | ICD-10-CM | POA: Diagnosis not present

## 2019-12-21 DIAGNOSIS — J310 Chronic rhinitis: Secondary | ICD-10-CM | POA: Diagnosis not present

## 2019-12-21 DIAGNOSIS — E785 Hyperlipidemia, unspecified: Secondary | ICD-10-CM | POA: Diagnosis not present

## 2019-12-21 DIAGNOSIS — I5032 Chronic diastolic (congestive) heart failure: Secondary | ICD-10-CM | POA: Diagnosis not present

## 2019-12-21 DIAGNOSIS — E039 Hypothyroidism, unspecified: Secondary | ICD-10-CM | POA: Diagnosis not present

## 2019-12-21 DIAGNOSIS — I4819 Other persistent atrial fibrillation: Secondary | ICD-10-CM | POA: Diagnosis not present

## 2019-12-21 DIAGNOSIS — H539 Unspecified visual disturbance: Secondary | ICD-10-CM | POA: Diagnosis not present

## 2019-12-21 DIAGNOSIS — D509 Iron deficiency anemia, unspecified: Secondary | ICD-10-CM | POA: Diagnosis not present

## 2019-12-21 DIAGNOSIS — E1151 Type 2 diabetes mellitus with diabetic peripheral angiopathy without gangrene: Secondary | ICD-10-CM | POA: Diagnosis not present

## 2019-12-21 DIAGNOSIS — T84195D Other mechanical complication of internal fixation device of left femur, subsequent encounter: Secondary | ICD-10-CM | POA: Diagnosis not present

## 2019-12-21 DIAGNOSIS — Z7901 Long term (current) use of anticoagulants: Secondary | ICD-10-CM | POA: Diagnosis not present

## 2019-12-21 LAB — CUP PACEART REMOTE DEVICE CHECK
Date Time Interrogation Session: 20210505042257
Implantable Pulse Generator Implant Date: 20200819

## 2019-12-22 ENCOUNTER — Ambulatory Visit (INDEPENDENT_AMBULATORY_CARE_PROVIDER_SITE_OTHER): Payer: Medicare Other | Admitting: *Deleted

## 2019-12-22 DIAGNOSIS — K219 Gastro-esophageal reflux disease without esophagitis: Secondary | ICD-10-CM | POA: Diagnosis not present

## 2019-12-22 DIAGNOSIS — I4819 Other persistent atrial fibrillation: Secondary | ICD-10-CM

## 2019-12-22 DIAGNOSIS — T84195D Other mechanical complication of internal fixation device of left femur, subsequent encounter: Secondary | ICD-10-CM | POA: Diagnosis not present

## 2019-12-22 DIAGNOSIS — E538 Deficiency of other specified B group vitamins: Secondary | ICD-10-CM | POA: Diagnosis not present

## 2019-12-22 DIAGNOSIS — E1122 Type 2 diabetes mellitus with diabetic chronic kidney disease: Secondary | ICD-10-CM | POA: Diagnosis not present

## 2019-12-22 DIAGNOSIS — N1832 Chronic kidney disease, stage 3b: Secondary | ICD-10-CM | POA: Diagnosis not present

## 2019-12-22 DIAGNOSIS — M1712 Unilateral primary osteoarthritis, left knee: Secondary | ICD-10-CM | POA: Diagnosis not present

## 2019-12-22 DIAGNOSIS — D509 Iron deficiency anemia, unspecified: Secondary | ICD-10-CM | POA: Diagnosis not present

## 2019-12-22 DIAGNOSIS — I130 Hypertensive heart and chronic kidney disease with heart failure and stage 1 through stage 4 chronic kidney disease, or unspecified chronic kidney disease: Secondary | ICD-10-CM | POA: Diagnosis not present

## 2019-12-22 DIAGNOSIS — K5900 Constipation, unspecified: Secondary | ICD-10-CM | POA: Diagnosis not present

## 2019-12-22 DIAGNOSIS — J310 Chronic rhinitis: Secondary | ICD-10-CM | POA: Diagnosis not present

## 2019-12-22 DIAGNOSIS — I5032 Chronic diastolic (congestive) heart failure: Secondary | ICD-10-CM | POA: Diagnosis not present

## 2019-12-22 DIAGNOSIS — E039 Hypothyroidism, unspecified: Secondary | ICD-10-CM | POA: Diagnosis not present

## 2019-12-22 DIAGNOSIS — H539 Unspecified visual disturbance: Secondary | ICD-10-CM | POA: Diagnosis not present

## 2019-12-22 DIAGNOSIS — R1314 Dysphagia, pharyngoesophageal phase: Secondary | ICD-10-CM | POA: Diagnosis not present

## 2019-12-22 DIAGNOSIS — Z7901 Long term (current) use of anticoagulants: Secondary | ICD-10-CM | POA: Diagnosis not present

## 2019-12-22 DIAGNOSIS — Z8616 Personal history of COVID-19: Secondary | ICD-10-CM | POA: Diagnosis not present

## 2019-12-22 DIAGNOSIS — E1151 Type 2 diabetes mellitus with diabetic peripheral angiopathy without gangrene: Secondary | ICD-10-CM | POA: Diagnosis not present

## 2019-12-22 DIAGNOSIS — E785 Hyperlipidemia, unspecified: Secondary | ICD-10-CM | POA: Diagnosis not present

## 2019-12-22 DIAGNOSIS — H9193 Unspecified hearing loss, bilateral: Secondary | ICD-10-CM | POA: Diagnosis not present

## 2019-12-22 DIAGNOSIS — E1169 Type 2 diabetes mellitus with other specified complication: Secondary | ICD-10-CM | POA: Diagnosis not present

## 2019-12-22 NOTE — Progress Notes (Signed)
Carelink Summary Report / Loop Recorder 

## 2019-12-23 DIAGNOSIS — E785 Hyperlipidemia, unspecified: Secondary | ICD-10-CM | POA: Diagnosis not present

## 2019-12-23 DIAGNOSIS — K219 Gastro-esophageal reflux disease without esophagitis: Secondary | ICD-10-CM | POA: Diagnosis not present

## 2019-12-23 DIAGNOSIS — Z7901 Long term (current) use of anticoagulants: Secondary | ICD-10-CM | POA: Diagnosis not present

## 2019-12-23 DIAGNOSIS — E1151 Type 2 diabetes mellitus with diabetic peripheral angiopathy without gangrene: Secondary | ICD-10-CM | POA: Diagnosis not present

## 2019-12-23 DIAGNOSIS — I4819 Other persistent atrial fibrillation: Secondary | ICD-10-CM | POA: Diagnosis not present

## 2019-12-23 DIAGNOSIS — H539 Unspecified visual disturbance: Secondary | ICD-10-CM | POA: Diagnosis not present

## 2019-12-23 DIAGNOSIS — Z8616 Personal history of COVID-19: Secondary | ICD-10-CM | POA: Diagnosis not present

## 2019-12-23 DIAGNOSIS — R1314 Dysphagia, pharyngoesophageal phase: Secondary | ICD-10-CM | POA: Diagnosis not present

## 2019-12-23 DIAGNOSIS — E039 Hypothyroidism, unspecified: Secondary | ICD-10-CM | POA: Diagnosis not present

## 2019-12-23 DIAGNOSIS — E538 Deficiency of other specified B group vitamins: Secondary | ICD-10-CM | POA: Diagnosis not present

## 2019-12-23 DIAGNOSIS — M1712 Unilateral primary osteoarthritis, left knee: Secondary | ICD-10-CM | POA: Diagnosis not present

## 2019-12-23 DIAGNOSIS — E1122 Type 2 diabetes mellitus with diabetic chronic kidney disease: Secondary | ICD-10-CM | POA: Diagnosis not present

## 2019-12-23 DIAGNOSIS — D509 Iron deficiency anemia, unspecified: Secondary | ICD-10-CM | POA: Diagnosis not present

## 2019-12-23 DIAGNOSIS — H9193 Unspecified hearing loss, bilateral: Secondary | ICD-10-CM | POA: Diagnosis not present

## 2019-12-23 DIAGNOSIS — K5900 Constipation, unspecified: Secondary | ICD-10-CM | POA: Diagnosis not present

## 2019-12-23 DIAGNOSIS — I5032 Chronic diastolic (congestive) heart failure: Secondary | ICD-10-CM | POA: Diagnosis not present

## 2019-12-23 DIAGNOSIS — J310 Chronic rhinitis: Secondary | ICD-10-CM | POA: Diagnosis not present

## 2019-12-23 DIAGNOSIS — T84195D Other mechanical complication of internal fixation device of left femur, subsequent encounter: Secondary | ICD-10-CM | POA: Diagnosis not present

## 2019-12-23 DIAGNOSIS — N1832 Chronic kidney disease, stage 3b: Secondary | ICD-10-CM | POA: Diagnosis not present

## 2019-12-23 DIAGNOSIS — E1169 Type 2 diabetes mellitus with other specified complication: Secondary | ICD-10-CM | POA: Diagnosis not present

## 2019-12-23 DIAGNOSIS — I130 Hypertensive heart and chronic kidney disease with heart failure and stage 1 through stage 4 chronic kidney disease, or unspecified chronic kidney disease: Secondary | ICD-10-CM | POA: Diagnosis not present

## 2019-12-26 ENCOUNTER — Telehealth: Payer: Self-pay | Admitting: Nurse Practitioner

## 2019-12-26 DIAGNOSIS — R600 Localized edema: Secondary | ICD-10-CM

## 2019-12-26 DIAGNOSIS — R609 Edema, unspecified: Secondary | ICD-10-CM

## 2019-12-26 DIAGNOSIS — E876 Hypokalemia: Secondary | ICD-10-CM

## 2019-12-26 MED ORDER — POTASSIUM CHLORIDE CRYS ER 20 MEQ PO TBCR: 20.0000 meq | EXTENDED_RELEASE_TABLET | Freq: Every day | ORAL | 1 refills | Status: DC

## 2019-12-26 MED ORDER — FUROSEMIDE 40 MG PO TABS: 40.0000 mg | ORAL_TABLET | Freq: Every day | ORAL | 1 refills | Status: DC

## 2019-12-26 NOTE — Telephone Encounter (Signed)
Due for follow up however patient has a broken hip and is staying out of town with son to recover. Will call to schedule follow up as soon as she is able to return home. Refills x 2 months sent in

## 2019-12-26 NOTE — Telephone Encounter (Signed)
  Prescription Request  12/26/2019  What is the name of the medication or equipment? potassium chloride SA (KLOR-CON) 20 MEQ tablet  furosemide (LASIX) 40 MG tablet   Have you contacted your pharmacy to request a refill? (if applicable)no, she is staying with her son and needs them sent to a new pharmacy   Which pharmacy would you like this sent to? [facility] Fax:  [phone]  Patient notified that their request is being sent to the clinical staff for review and that they should receive a response within 2 business days.

## 2019-12-29 DIAGNOSIS — Z7901 Long term (current) use of anticoagulants: Secondary | ICD-10-CM | POA: Diagnosis not present

## 2019-12-29 DIAGNOSIS — R1314 Dysphagia, pharyngoesophageal phase: Secondary | ICD-10-CM | POA: Diagnosis not present

## 2019-12-29 DIAGNOSIS — H539 Unspecified visual disturbance: Secondary | ICD-10-CM | POA: Diagnosis not present

## 2019-12-29 DIAGNOSIS — N1832 Chronic kidney disease, stage 3b: Secondary | ICD-10-CM | POA: Diagnosis not present

## 2019-12-29 DIAGNOSIS — H9193 Unspecified hearing loss, bilateral: Secondary | ICD-10-CM | POA: Diagnosis not present

## 2019-12-29 DIAGNOSIS — E1122 Type 2 diabetes mellitus with diabetic chronic kidney disease: Secondary | ICD-10-CM | POA: Diagnosis not present

## 2019-12-29 DIAGNOSIS — Z8616 Personal history of COVID-19: Secondary | ICD-10-CM | POA: Diagnosis not present

## 2019-12-29 DIAGNOSIS — T84195D Other mechanical complication of internal fixation device of left femur, subsequent encounter: Secondary | ICD-10-CM | POA: Diagnosis not present

## 2019-12-29 DIAGNOSIS — M1712 Unilateral primary osteoarthritis, left knee: Secondary | ICD-10-CM | POA: Diagnosis not present

## 2019-12-29 DIAGNOSIS — I4819 Other persistent atrial fibrillation: Secondary | ICD-10-CM | POA: Diagnosis not present

## 2019-12-29 DIAGNOSIS — J310 Chronic rhinitis: Secondary | ICD-10-CM | POA: Diagnosis not present

## 2019-12-29 DIAGNOSIS — K219 Gastro-esophageal reflux disease without esophagitis: Secondary | ICD-10-CM | POA: Diagnosis not present

## 2019-12-29 DIAGNOSIS — E785 Hyperlipidemia, unspecified: Secondary | ICD-10-CM | POA: Diagnosis not present

## 2019-12-29 DIAGNOSIS — E1169 Type 2 diabetes mellitus with other specified complication: Secondary | ICD-10-CM | POA: Diagnosis not present

## 2019-12-29 DIAGNOSIS — D509 Iron deficiency anemia, unspecified: Secondary | ICD-10-CM | POA: Diagnosis not present

## 2019-12-29 DIAGNOSIS — E1151 Type 2 diabetes mellitus with diabetic peripheral angiopathy without gangrene: Secondary | ICD-10-CM | POA: Diagnosis not present

## 2019-12-29 DIAGNOSIS — I5032 Chronic diastolic (congestive) heart failure: Secondary | ICD-10-CM | POA: Diagnosis not present

## 2019-12-29 DIAGNOSIS — E039 Hypothyroidism, unspecified: Secondary | ICD-10-CM | POA: Diagnosis not present

## 2019-12-29 DIAGNOSIS — I130 Hypertensive heart and chronic kidney disease with heart failure and stage 1 through stage 4 chronic kidney disease, or unspecified chronic kidney disease: Secondary | ICD-10-CM | POA: Diagnosis not present

## 2019-12-29 DIAGNOSIS — K5900 Constipation, unspecified: Secondary | ICD-10-CM | POA: Diagnosis not present

## 2019-12-29 DIAGNOSIS — E538 Deficiency of other specified B group vitamins: Secondary | ICD-10-CM | POA: Diagnosis not present

## 2019-12-30 DIAGNOSIS — E785 Hyperlipidemia, unspecified: Secondary | ICD-10-CM | POA: Diagnosis not present

## 2019-12-30 DIAGNOSIS — E1122 Type 2 diabetes mellitus with diabetic chronic kidney disease: Secondary | ICD-10-CM | POA: Diagnosis not present

## 2019-12-30 DIAGNOSIS — J310 Chronic rhinitis: Secondary | ICD-10-CM | POA: Diagnosis not present

## 2019-12-30 DIAGNOSIS — I130 Hypertensive heart and chronic kidney disease with heart failure and stage 1 through stage 4 chronic kidney disease, or unspecified chronic kidney disease: Secondary | ICD-10-CM | POA: Diagnosis not present

## 2019-12-30 DIAGNOSIS — K5900 Constipation, unspecified: Secondary | ICD-10-CM | POA: Diagnosis not present

## 2019-12-30 DIAGNOSIS — Z8616 Personal history of COVID-19: Secondary | ICD-10-CM | POA: Diagnosis not present

## 2019-12-30 DIAGNOSIS — M1712 Unilateral primary osteoarthritis, left knee: Secondary | ICD-10-CM | POA: Diagnosis not present

## 2019-12-30 DIAGNOSIS — K219 Gastro-esophageal reflux disease without esophagitis: Secondary | ICD-10-CM | POA: Diagnosis not present

## 2019-12-30 DIAGNOSIS — E1169 Type 2 diabetes mellitus with other specified complication: Secondary | ICD-10-CM | POA: Diagnosis not present

## 2019-12-30 DIAGNOSIS — Z7901 Long term (current) use of anticoagulants: Secondary | ICD-10-CM | POA: Diagnosis not present

## 2019-12-30 DIAGNOSIS — I4819 Other persistent atrial fibrillation: Secondary | ICD-10-CM | POA: Diagnosis not present

## 2019-12-30 DIAGNOSIS — R1314 Dysphagia, pharyngoesophageal phase: Secondary | ICD-10-CM | POA: Diagnosis not present

## 2019-12-30 DIAGNOSIS — D509 Iron deficiency anemia, unspecified: Secondary | ICD-10-CM | POA: Diagnosis not present

## 2019-12-30 DIAGNOSIS — N1832 Chronic kidney disease, stage 3b: Secondary | ICD-10-CM | POA: Diagnosis not present

## 2019-12-30 DIAGNOSIS — T84195D Other mechanical complication of internal fixation device of left femur, subsequent encounter: Secondary | ICD-10-CM | POA: Diagnosis not present

## 2019-12-30 DIAGNOSIS — I5032 Chronic diastolic (congestive) heart failure: Secondary | ICD-10-CM | POA: Diagnosis not present

## 2019-12-30 DIAGNOSIS — E1151 Type 2 diabetes mellitus with diabetic peripheral angiopathy without gangrene: Secondary | ICD-10-CM | POA: Diagnosis not present

## 2019-12-30 DIAGNOSIS — H9193 Unspecified hearing loss, bilateral: Secondary | ICD-10-CM | POA: Diagnosis not present

## 2019-12-30 DIAGNOSIS — H539 Unspecified visual disturbance: Secondary | ICD-10-CM | POA: Diagnosis not present

## 2019-12-30 DIAGNOSIS — E538 Deficiency of other specified B group vitamins: Secondary | ICD-10-CM | POA: Diagnosis not present

## 2019-12-30 DIAGNOSIS — E039 Hypothyroidism, unspecified: Secondary | ICD-10-CM | POA: Diagnosis not present

## 2020-01-03 DIAGNOSIS — N1832 Chronic kidney disease, stage 3b: Secondary | ICD-10-CM | POA: Diagnosis not present

## 2020-01-03 DIAGNOSIS — E538 Deficiency of other specified B group vitamins: Secondary | ICD-10-CM | POA: Diagnosis not present

## 2020-01-03 DIAGNOSIS — H539 Unspecified visual disturbance: Secondary | ICD-10-CM | POA: Diagnosis not present

## 2020-01-03 DIAGNOSIS — D509 Iron deficiency anemia, unspecified: Secondary | ICD-10-CM | POA: Diagnosis not present

## 2020-01-03 DIAGNOSIS — M1712 Unilateral primary osteoarthritis, left knee: Secondary | ICD-10-CM | POA: Diagnosis not present

## 2020-01-03 DIAGNOSIS — E1151 Type 2 diabetes mellitus with diabetic peripheral angiopathy without gangrene: Secondary | ICD-10-CM | POA: Diagnosis not present

## 2020-01-03 DIAGNOSIS — I5032 Chronic diastolic (congestive) heart failure: Secondary | ICD-10-CM | POA: Diagnosis not present

## 2020-01-03 DIAGNOSIS — I4819 Other persistent atrial fibrillation: Secondary | ICD-10-CM | POA: Diagnosis not present

## 2020-01-03 DIAGNOSIS — R1314 Dysphagia, pharyngoesophageal phase: Secondary | ICD-10-CM | POA: Diagnosis not present

## 2020-01-03 DIAGNOSIS — E1122 Type 2 diabetes mellitus with diabetic chronic kidney disease: Secondary | ICD-10-CM | POA: Diagnosis not present

## 2020-01-03 DIAGNOSIS — K5900 Constipation, unspecified: Secondary | ICD-10-CM | POA: Diagnosis not present

## 2020-01-03 DIAGNOSIS — I130 Hypertensive heart and chronic kidney disease with heart failure and stage 1 through stage 4 chronic kidney disease, or unspecified chronic kidney disease: Secondary | ICD-10-CM | POA: Diagnosis not present

## 2020-01-03 DIAGNOSIS — T84195D Other mechanical complication of internal fixation device of left femur, subsequent encounter: Secondary | ICD-10-CM | POA: Diagnosis not present

## 2020-01-03 DIAGNOSIS — Z8616 Personal history of COVID-19: Secondary | ICD-10-CM | POA: Diagnosis not present

## 2020-01-03 DIAGNOSIS — J310 Chronic rhinitis: Secondary | ICD-10-CM | POA: Diagnosis not present

## 2020-01-03 DIAGNOSIS — H9193 Unspecified hearing loss, bilateral: Secondary | ICD-10-CM | POA: Diagnosis not present

## 2020-01-03 DIAGNOSIS — K219 Gastro-esophageal reflux disease without esophagitis: Secondary | ICD-10-CM | POA: Diagnosis not present

## 2020-01-03 DIAGNOSIS — E1169 Type 2 diabetes mellitus with other specified complication: Secondary | ICD-10-CM | POA: Diagnosis not present

## 2020-01-03 DIAGNOSIS — E039 Hypothyroidism, unspecified: Secondary | ICD-10-CM | POA: Diagnosis not present

## 2020-01-03 DIAGNOSIS — E785 Hyperlipidemia, unspecified: Secondary | ICD-10-CM | POA: Diagnosis not present

## 2020-01-03 DIAGNOSIS — Z7901 Long term (current) use of anticoagulants: Secondary | ICD-10-CM | POA: Diagnosis not present

## 2020-01-05 DIAGNOSIS — I5032 Chronic diastolic (congestive) heart failure: Secondary | ICD-10-CM | POA: Diagnosis not present

## 2020-01-05 DIAGNOSIS — T84195D Other mechanical complication of internal fixation device of left femur, subsequent encounter: Secondary | ICD-10-CM | POA: Diagnosis not present

## 2020-01-05 DIAGNOSIS — K219 Gastro-esophageal reflux disease without esophagitis: Secondary | ICD-10-CM | POA: Diagnosis not present

## 2020-01-05 DIAGNOSIS — E785 Hyperlipidemia, unspecified: Secondary | ICD-10-CM | POA: Diagnosis not present

## 2020-01-05 DIAGNOSIS — H9193 Unspecified hearing loss, bilateral: Secondary | ICD-10-CM | POA: Diagnosis not present

## 2020-01-05 DIAGNOSIS — N1832 Chronic kidney disease, stage 3b: Secondary | ICD-10-CM | POA: Diagnosis not present

## 2020-01-05 DIAGNOSIS — K5900 Constipation, unspecified: Secondary | ICD-10-CM | POA: Diagnosis not present

## 2020-01-05 DIAGNOSIS — Z7901 Long term (current) use of anticoagulants: Secondary | ICD-10-CM | POA: Diagnosis not present

## 2020-01-05 DIAGNOSIS — E538 Deficiency of other specified B group vitamins: Secondary | ICD-10-CM | POA: Diagnosis not present

## 2020-01-05 DIAGNOSIS — M1712 Unilateral primary osteoarthritis, left knee: Secondary | ICD-10-CM | POA: Diagnosis not present

## 2020-01-05 DIAGNOSIS — H539 Unspecified visual disturbance: Secondary | ICD-10-CM | POA: Diagnosis not present

## 2020-01-05 DIAGNOSIS — I4819 Other persistent atrial fibrillation: Secondary | ICD-10-CM | POA: Diagnosis not present

## 2020-01-05 DIAGNOSIS — J310 Chronic rhinitis: Secondary | ICD-10-CM | POA: Diagnosis not present

## 2020-01-05 DIAGNOSIS — R1314 Dysphagia, pharyngoesophageal phase: Secondary | ICD-10-CM | POA: Diagnosis not present

## 2020-01-05 DIAGNOSIS — D509 Iron deficiency anemia, unspecified: Secondary | ICD-10-CM | POA: Diagnosis not present

## 2020-01-05 DIAGNOSIS — E039 Hypothyroidism, unspecified: Secondary | ICD-10-CM | POA: Diagnosis not present

## 2020-01-05 DIAGNOSIS — E1169 Type 2 diabetes mellitus with other specified complication: Secondary | ICD-10-CM | POA: Diagnosis not present

## 2020-01-05 DIAGNOSIS — E1122 Type 2 diabetes mellitus with diabetic chronic kidney disease: Secondary | ICD-10-CM | POA: Diagnosis not present

## 2020-01-05 DIAGNOSIS — E1151 Type 2 diabetes mellitus with diabetic peripheral angiopathy without gangrene: Secondary | ICD-10-CM | POA: Diagnosis not present

## 2020-01-05 DIAGNOSIS — Z8616 Personal history of COVID-19: Secondary | ICD-10-CM | POA: Diagnosis not present

## 2020-01-05 DIAGNOSIS — I130 Hypertensive heart and chronic kidney disease with heart failure and stage 1 through stage 4 chronic kidney disease, or unspecified chronic kidney disease: Secondary | ICD-10-CM | POA: Diagnosis not present

## 2020-01-09 DIAGNOSIS — Z7901 Long term (current) use of anticoagulants: Secondary | ICD-10-CM | POA: Diagnosis not present

## 2020-01-09 DIAGNOSIS — T84195D Other mechanical complication of internal fixation device of left femur, subsequent encounter: Secondary | ICD-10-CM | POA: Diagnosis not present

## 2020-01-09 DIAGNOSIS — K219 Gastro-esophageal reflux disease without esophagitis: Secondary | ICD-10-CM | POA: Diagnosis not present

## 2020-01-09 DIAGNOSIS — H9193 Unspecified hearing loss, bilateral: Secondary | ICD-10-CM | POA: Diagnosis not present

## 2020-01-09 DIAGNOSIS — D509 Iron deficiency anemia, unspecified: Secondary | ICD-10-CM | POA: Diagnosis not present

## 2020-01-09 DIAGNOSIS — N1832 Chronic kidney disease, stage 3b: Secondary | ICD-10-CM | POA: Diagnosis not present

## 2020-01-09 DIAGNOSIS — E1151 Type 2 diabetes mellitus with diabetic peripheral angiopathy without gangrene: Secondary | ICD-10-CM | POA: Diagnosis not present

## 2020-01-09 DIAGNOSIS — I4819 Other persistent atrial fibrillation: Secondary | ICD-10-CM | POA: Diagnosis not present

## 2020-01-09 DIAGNOSIS — I5032 Chronic diastolic (congestive) heart failure: Secondary | ICD-10-CM | POA: Diagnosis not present

## 2020-01-09 DIAGNOSIS — K5900 Constipation, unspecified: Secondary | ICD-10-CM | POA: Diagnosis not present

## 2020-01-09 DIAGNOSIS — I130 Hypertensive heart and chronic kidney disease with heart failure and stage 1 through stage 4 chronic kidney disease, or unspecified chronic kidney disease: Secondary | ICD-10-CM | POA: Diagnosis not present

## 2020-01-09 DIAGNOSIS — M1712 Unilateral primary osteoarthritis, left knee: Secondary | ICD-10-CM | POA: Diagnosis not present

## 2020-01-09 DIAGNOSIS — J310 Chronic rhinitis: Secondary | ICD-10-CM | POA: Diagnosis not present

## 2020-01-09 DIAGNOSIS — E039 Hypothyroidism, unspecified: Secondary | ICD-10-CM | POA: Diagnosis not present

## 2020-01-09 DIAGNOSIS — E1169 Type 2 diabetes mellitus with other specified complication: Secondary | ICD-10-CM | POA: Diagnosis not present

## 2020-01-09 DIAGNOSIS — R1314 Dysphagia, pharyngoesophageal phase: Secondary | ICD-10-CM | POA: Diagnosis not present

## 2020-01-09 DIAGNOSIS — E538 Deficiency of other specified B group vitamins: Secondary | ICD-10-CM | POA: Diagnosis not present

## 2020-01-09 DIAGNOSIS — Z8616 Personal history of COVID-19: Secondary | ICD-10-CM | POA: Diagnosis not present

## 2020-01-09 DIAGNOSIS — H539 Unspecified visual disturbance: Secondary | ICD-10-CM | POA: Diagnosis not present

## 2020-01-09 DIAGNOSIS — E785 Hyperlipidemia, unspecified: Secondary | ICD-10-CM | POA: Diagnosis not present

## 2020-01-09 DIAGNOSIS — E1122 Type 2 diabetes mellitus with diabetic chronic kidney disease: Secondary | ICD-10-CM | POA: Diagnosis not present

## 2020-01-10 DIAGNOSIS — I130 Hypertensive heart and chronic kidney disease with heart failure and stage 1 through stage 4 chronic kidney disease, or unspecified chronic kidney disease: Secondary | ICD-10-CM | POA: Diagnosis not present

## 2020-01-10 DIAGNOSIS — Z7901 Long term (current) use of anticoagulants: Secondary | ICD-10-CM | POA: Diagnosis not present

## 2020-01-10 DIAGNOSIS — H9193 Unspecified hearing loss, bilateral: Secondary | ICD-10-CM | POA: Diagnosis not present

## 2020-01-10 DIAGNOSIS — E538 Deficiency of other specified B group vitamins: Secondary | ICD-10-CM | POA: Diagnosis not present

## 2020-01-10 DIAGNOSIS — J310 Chronic rhinitis: Secondary | ICD-10-CM | POA: Diagnosis not present

## 2020-01-10 DIAGNOSIS — K219 Gastro-esophageal reflux disease without esophagitis: Secondary | ICD-10-CM | POA: Diagnosis not present

## 2020-01-10 DIAGNOSIS — N1832 Chronic kidney disease, stage 3b: Secondary | ICD-10-CM | POA: Diagnosis not present

## 2020-01-10 DIAGNOSIS — I4819 Other persistent atrial fibrillation: Secondary | ICD-10-CM | POA: Diagnosis not present

## 2020-01-10 DIAGNOSIS — T84195D Other mechanical complication of internal fixation device of left femur, subsequent encounter: Secondary | ICD-10-CM | POA: Diagnosis not present

## 2020-01-10 DIAGNOSIS — R1314 Dysphagia, pharyngoesophageal phase: Secondary | ICD-10-CM | POA: Diagnosis not present

## 2020-01-10 DIAGNOSIS — E039 Hypothyroidism, unspecified: Secondary | ICD-10-CM | POA: Diagnosis not present

## 2020-01-10 DIAGNOSIS — I5032 Chronic diastolic (congestive) heart failure: Secondary | ICD-10-CM | POA: Diagnosis not present

## 2020-01-10 DIAGNOSIS — K5900 Constipation, unspecified: Secondary | ICD-10-CM | POA: Diagnosis not present

## 2020-01-10 DIAGNOSIS — Z8616 Personal history of COVID-19: Secondary | ICD-10-CM | POA: Diagnosis not present

## 2020-01-10 DIAGNOSIS — H539 Unspecified visual disturbance: Secondary | ICD-10-CM | POA: Diagnosis not present

## 2020-01-10 DIAGNOSIS — E1169 Type 2 diabetes mellitus with other specified complication: Secondary | ICD-10-CM | POA: Diagnosis not present

## 2020-01-10 DIAGNOSIS — M1712 Unilateral primary osteoarthritis, left knee: Secondary | ICD-10-CM | POA: Diagnosis not present

## 2020-01-10 DIAGNOSIS — E785 Hyperlipidemia, unspecified: Secondary | ICD-10-CM | POA: Diagnosis not present

## 2020-01-10 DIAGNOSIS — E1122 Type 2 diabetes mellitus with diabetic chronic kidney disease: Secondary | ICD-10-CM | POA: Diagnosis not present

## 2020-01-10 DIAGNOSIS — E1151 Type 2 diabetes mellitus with diabetic peripheral angiopathy without gangrene: Secondary | ICD-10-CM | POA: Diagnosis not present

## 2020-01-10 DIAGNOSIS — D509 Iron deficiency anemia, unspecified: Secondary | ICD-10-CM | POA: Diagnosis not present

## 2020-01-11 DIAGNOSIS — Z7901 Long term (current) use of anticoagulants: Secondary | ICD-10-CM | POA: Diagnosis not present

## 2020-01-11 DIAGNOSIS — E538 Deficiency of other specified B group vitamins: Secondary | ICD-10-CM | POA: Diagnosis not present

## 2020-01-11 DIAGNOSIS — Z8616 Personal history of COVID-19: Secondary | ICD-10-CM | POA: Diagnosis not present

## 2020-01-11 DIAGNOSIS — I5032 Chronic diastolic (congestive) heart failure: Secondary | ICD-10-CM | POA: Diagnosis not present

## 2020-01-11 DIAGNOSIS — E039 Hypothyroidism, unspecified: Secondary | ICD-10-CM | POA: Diagnosis not present

## 2020-01-11 DIAGNOSIS — E1169 Type 2 diabetes mellitus with other specified complication: Secondary | ICD-10-CM | POA: Diagnosis not present

## 2020-01-11 DIAGNOSIS — K5900 Constipation, unspecified: Secondary | ICD-10-CM | POA: Diagnosis not present

## 2020-01-11 DIAGNOSIS — E1151 Type 2 diabetes mellitus with diabetic peripheral angiopathy without gangrene: Secondary | ICD-10-CM | POA: Diagnosis not present

## 2020-01-11 DIAGNOSIS — J310 Chronic rhinitis: Secondary | ICD-10-CM | POA: Diagnosis not present

## 2020-01-11 DIAGNOSIS — K219 Gastro-esophageal reflux disease without esophagitis: Secondary | ICD-10-CM | POA: Diagnosis not present

## 2020-01-11 DIAGNOSIS — S7292XA Unspecified fracture of left femur, initial encounter for closed fracture: Secondary | ICD-10-CM | POA: Diagnosis not present

## 2020-01-11 DIAGNOSIS — H9193 Unspecified hearing loss, bilateral: Secondary | ICD-10-CM | POA: Diagnosis not present

## 2020-01-11 DIAGNOSIS — D509 Iron deficiency anemia, unspecified: Secondary | ICD-10-CM | POA: Diagnosis not present

## 2020-01-11 DIAGNOSIS — I4819 Other persistent atrial fibrillation: Secondary | ICD-10-CM | POA: Diagnosis not present

## 2020-01-11 DIAGNOSIS — N1832 Chronic kidney disease, stage 3b: Secondary | ICD-10-CM | POA: Diagnosis not present

## 2020-01-11 DIAGNOSIS — M1712 Unilateral primary osteoarthritis, left knee: Secondary | ICD-10-CM | POA: Diagnosis not present

## 2020-01-11 DIAGNOSIS — H539 Unspecified visual disturbance: Secondary | ICD-10-CM | POA: Diagnosis not present

## 2020-01-11 DIAGNOSIS — R1314 Dysphagia, pharyngoesophageal phase: Secondary | ICD-10-CM | POA: Diagnosis not present

## 2020-01-11 DIAGNOSIS — T84195D Other mechanical complication of internal fixation device of left femur, subsequent encounter: Secondary | ICD-10-CM | POA: Diagnosis not present

## 2020-01-11 DIAGNOSIS — E1122 Type 2 diabetes mellitus with diabetic chronic kidney disease: Secondary | ICD-10-CM | POA: Diagnosis not present

## 2020-01-11 DIAGNOSIS — E785 Hyperlipidemia, unspecified: Secondary | ICD-10-CM | POA: Diagnosis not present

## 2020-01-11 DIAGNOSIS — I130 Hypertensive heart and chronic kidney disease with heart failure and stage 1 through stage 4 chronic kidney disease, or unspecified chronic kidney disease: Secondary | ICD-10-CM | POA: Diagnosis not present

## 2020-01-16 DIAGNOSIS — T84195D Other mechanical complication of internal fixation device of left femur, subsequent encounter: Secondary | ICD-10-CM | POA: Diagnosis not present

## 2020-01-16 DIAGNOSIS — N1832 Chronic kidney disease, stage 3b: Secondary | ICD-10-CM | POA: Diagnosis not present

## 2020-01-16 DIAGNOSIS — E1122 Type 2 diabetes mellitus with diabetic chronic kidney disease: Secondary | ICD-10-CM | POA: Diagnosis not present

## 2020-01-16 DIAGNOSIS — E1169 Type 2 diabetes mellitus with other specified complication: Secondary | ICD-10-CM | POA: Diagnosis not present

## 2020-01-16 DIAGNOSIS — K5900 Constipation, unspecified: Secondary | ICD-10-CM | POA: Diagnosis not present

## 2020-01-16 DIAGNOSIS — D509 Iron deficiency anemia, unspecified: Secondary | ICD-10-CM | POA: Diagnosis not present

## 2020-01-16 DIAGNOSIS — H9193 Unspecified hearing loss, bilateral: Secondary | ICD-10-CM | POA: Diagnosis not present

## 2020-01-16 DIAGNOSIS — M1712 Unilateral primary osteoarthritis, left knee: Secondary | ICD-10-CM | POA: Diagnosis not present

## 2020-01-16 DIAGNOSIS — K219 Gastro-esophageal reflux disease without esophagitis: Secondary | ICD-10-CM | POA: Diagnosis not present

## 2020-01-16 DIAGNOSIS — I5032 Chronic diastolic (congestive) heart failure: Secondary | ICD-10-CM | POA: Diagnosis not present

## 2020-01-16 DIAGNOSIS — R1314 Dysphagia, pharyngoesophageal phase: Secondary | ICD-10-CM | POA: Diagnosis not present

## 2020-01-16 DIAGNOSIS — J310 Chronic rhinitis: Secondary | ICD-10-CM | POA: Diagnosis not present

## 2020-01-16 DIAGNOSIS — I130 Hypertensive heart and chronic kidney disease with heart failure and stage 1 through stage 4 chronic kidney disease, or unspecified chronic kidney disease: Secondary | ICD-10-CM | POA: Diagnosis not present

## 2020-01-16 DIAGNOSIS — Z8616 Personal history of COVID-19: Secondary | ICD-10-CM | POA: Diagnosis not present

## 2020-01-16 DIAGNOSIS — Z7901 Long term (current) use of anticoagulants: Secondary | ICD-10-CM | POA: Diagnosis not present

## 2020-01-16 DIAGNOSIS — E538 Deficiency of other specified B group vitamins: Secondary | ICD-10-CM | POA: Diagnosis not present

## 2020-01-16 DIAGNOSIS — E785 Hyperlipidemia, unspecified: Secondary | ICD-10-CM | POA: Diagnosis not present

## 2020-01-16 DIAGNOSIS — E039 Hypothyroidism, unspecified: Secondary | ICD-10-CM | POA: Diagnosis not present

## 2020-01-16 DIAGNOSIS — I4819 Other persistent atrial fibrillation: Secondary | ICD-10-CM | POA: Diagnosis not present

## 2020-01-16 DIAGNOSIS — E1151 Type 2 diabetes mellitus with diabetic peripheral angiopathy without gangrene: Secondary | ICD-10-CM | POA: Diagnosis not present

## 2020-01-16 DIAGNOSIS — H539 Unspecified visual disturbance: Secondary | ICD-10-CM | POA: Diagnosis not present

## 2020-01-17 ENCOUNTER — Other Ambulatory Visit: Payer: Self-pay

## 2020-01-17 ENCOUNTER — Ambulatory Visit (INDEPENDENT_AMBULATORY_CARE_PROVIDER_SITE_OTHER): Payer: Medicare Other

## 2020-01-17 DIAGNOSIS — M1712 Unilateral primary osteoarthritis, left knee: Secondary | ICD-10-CM

## 2020-01-17 DIAGNOSIS — Z7901 Long term (current) use of anticoagulants: Secondary | ICD-10-CM

## 2020-01-17 DIAGNOSIS — J310 Chronic rhinitis: Secondary | ICD-10-CM | POA: Diagnosis not present

## 2020-01-17 DIAGNOSIS — I130 Hypertensive heart and chronic kidney disease with heart failure and stage 1 through stage 4 chronic kidney disease, or unspecified chronic kidney disease: Secondary | ICD-10-CM

## 2020-01-17 DIAGNOSIS — M80052D Age-related osteoporosis with current pathological fracture, left femur, subsequent encounter for fracture with routine healing: Secondary | ICD-10-CM

## 2020-01-17 DIAGNOSIS — E039 Hypothyroidism, unspecified: Secondary | ICD-10-CM

## 2020-01-17 DIAGNOSIS — E1122 Type 2 diabetes mellitus with diabetic chronic kidney disease: Secondary | ICD-10-CM

## 2020-01-17 DIAGNOSIS — N1832 Chronic kidney disease, stage 3b: Secondary | ICD-10-CM

## 2020-01-17 DIAGNOSIS — E1169 Type 2 diabetes mellitus with other specified complication: Secondary | ICD-10-CM

## 2020-01-17 DIAGNOSIS — F419 Anxiety disorder, unspecified: Secondary | ICD-10-CM

## 2020-01-17 DIAGNOSIS — Z8616 Personal history of COVID-19: Secondary | ICD-10-CM

## 2020-01-17 DIAGNOSIS — Z9181 History of falling: Secondary | ICD-10-CM

## 2020-01-17 DIAGNOSIS — I5032 Chronic diastolic (congestive) heart failure: Secondary | ICD-10-CM | POA: Diagnosis not present

## 2020-01-17 DIAGNOSIS — F0280 Dementia in other diseases classified elsewhere without behavioral disturbance: Secondary | ICD-10-CM

## 2020-01-17 DIAGNOSIS — E538 Deficiency of other specified B group vitamins: Secondary | ICD-10-CM | POA: Diagnosis not present

## 2020-01-17 DIAGNOSIS — I4891 Unspecified atrial fibrillation: Secondary | ICD-10-CM

## 2020-01-17 DIAGNOSIS — R1314 Dysphagia, pharyngoesophageal phase: Secondary | ICD-10-CM | POA: Diagnosis not present

## 2020-01-17 DIAGNOSIS — K219 Gastro-esophageal reflux disease without esophagitis: Secondary | ICD-10-CM | POA: Diagnosis not present

## 2020-01-17 DIAGNOSIS — D509 Iron deficiency anemia, unspecified: Secondary | ICD-10-CM | POA: Diagnosis not present

## 2020-01-17 DIAGNOSIS — E1151 Type 2 diabetes mellitus with diabetic peripheral angiopathy without gangrene: Secondary | ICD-10-CM | POA: Diagnosis not present

## 2020-01-17 DIAGNOSIS — K5900 Constipation, unspecified: Secondary | ICD-10-CM

## 2020-01-17 DIAGNOSIS — H539 Unspecified visual disturbance: Secondary | ICD-10-CM | POA: Diagnosis not present

## 2020-01-17 DIAGNOSIS — T84195D Other mechanical complication of internal fixation device of left femur, subsequent encounter: Secondary | ICD-10-CM

## 2020-01-17 DIAGNOSIS — I4819 Other persistent atrial fibrillation: Secondary | ICD-10-CM | POA: Diagnosis not present

## 2020-01-17 DIAGNOSIS — H9193 Unspecified hearing loss, bilateral: Secondary | ICD-10-CM

## 2020-01-17 DIAGNOSIS — E785 Hyperlipidemia, unspecified: Secondary | ICD-10-CM | POA: Diagnosis not present

## 2020-01-17 DIAGNOSIS — F1320 Sedative, hypnotic or anxiolytic dependence, uncomplicated: Secondary | ICD-10-CM

## 2020-01-18 DIAGNOSIS — Z7901 Long term (current) use of anticoagulants: Secondary | ICD-10-CM | POA: Diagnosis not present

## 2020-01-18 DIAGNOSIS — E1151 Type 2 diabetes mellitus with diabetic peripheral angiopathy without gangrene: Secondary | ICD-10-CM | POA: Diagnosis not present

## 2020-01-18 DIAGNOSIS — E039 Hypothyroidism, unspecified: Secondary | ICD-10-CM | POA: Diagnosis not present

## 2020-01-18 DIAGNOSIS — M1712 Unilateral primary osteoarthritis, left knee: Secondary | ICD-10-CM | POA: Diagnosis not present

## 2020-01-18 DIAGNOSIS — E1122 Type 2 diabetes mellitus with diabetic chronic kidney disease: Secondary | ICD-10-CM | POA: Diagnosis not present

## 2020-01-18 DIAGNOSIS — I5032 Chronic diastolic (congestive) heart failure: Secondary | ICD-10-CM | POA: Diagnosis not present

## 2020-01-18 DIAGNOSIS — K5900 Constipation, unspecified: Secondary | ICD-10-CM | POA: Diagnosis not present

## 2020-01-18 DIAGNOSIS — E785 Hyperlipidemia, unspecified: Secondary | ICD-10-CM | POA: Diagnosis not present

## 2020-01-18 DIAGNOSIS — D509 Iron deficiency anemia, unspecified: Secondary | ICD-10-CM | POA: Diagnosis not present

## 2020-01-18 DIAGNOSIS — H9193 Unspecified hearing loss, bilateral: Secondary | ICD-10-CM | POA: Diagnosis not present

## 2020-01-18 DIAGNOSIS — E1169 Type 2 diabetes mellitus with other specified complication: Secondary | ICD-10-CM | POA: Diagnosis not present

## 2020-01-18 DIAGNOSIS — J310 Chronic rhinitis: Secondary | ICD-10-CM | POA: Diagnosis not present

## 2020-01-18 DIAGNOSIS — I4819 Other persistent atrial fibrillation: Secondary | ICD-10-CM | POA: Diagnosis not present

## 2020-01-18 DIAGNOSIS — T84195D Other mechanical complication of internal fixation device of left femur, subsequent encounter: Secondary | ICD-10-CM | POA: Diagnosis not present

## 2020-01-18 DIAGNOSIS — K219 Gastro-esophageal reflux disease without esophagitis: Secondary | ICD-10-CM | POA: Diagnosis not present

## 2020-01-18 DIAGNOSIS — Z8616 Personal history of COVID-19: Secondary | ICD-10-CM | POA: Diagnosis not present

## 2020-01-18 DIAGNOSIS — N1832 Chronic kidney disease, stage 3b: Secondary | ICD-10-CM | POA: Diagnosis not present

## 2020-01-18 DIAGNOSIS — E538 Deficiency of other specified B group vitamins: Secondary | ICD-10-CM | POA: Diagnosis not present

## 2020-01-18 DIAGNOSIS — R1314 Dysphagia, pharyngoesophageal phase: Secondary | ICD-10-CM | POA: Diagnosis not present

## 2020-01-18 DIAGNOSIS — H539 Unspecified visual disturbance: Secondary | ICD-10-CM | POA: Diagnosis not present

## 2020-01-18 DIAGNOSIS — I130 Hypertensive heart and chronic kidney disease with heart failure and stage 1 through stage 4 chronic kidney disease, or unspecified chronic kidney disease: Secondary | ICD-10-CM | POA: Diagnosis not present

## 2020-01-23 ENCOUNTER — Ambulatory Visit (INDEPENDENT_AMBULATORY_CARE_PROVIDER_SITE_OTHER): Payer: Medicare Other | Admitting: *Deleted

## 2020-01-23 DIAGNOSIS — I130 Hypertensive heart and chronic kidney disease with heart failure and stage 1 through stage 4 chronic kidney disease, or unspecified chronic kidney disease: Secondary | ICD-10-CM | POA: Diagnosis not present

## 2020-01-23 DIAGNOSIS — R1314 Dysphagia, pharyngoesophageal phase: Secondary | ICD-10-CM | POA: Diagnosis not present

## 2020-01-23 DIAGNOSIS — D509 Iron deficiency anemia, unspecified: Secondary | ICD-10-CM | POA: Diagnosis not present

## 2020-01-23 DIAGNOSIS — E1151 Type 2 diabetes mellitus with diabetic peripheral angiopathy without gangrene: Secondary | ICD-10-CM | POA: Diagnosis not present

## 2020-01-23 DIAGNOSIS — J310 Chronic rhinitis: Secondary | ICD-10-CM | POA: Diagnosis not present

## 2020-01-23 DIAGNOSIS — K5900 Constipation, unspecified: Secondary | ICD-10-CM | POA: Diagnosis not present

## 2020-01-23 DIAGNOSIS — H539 Unspecified visual disturbance: Secondary | ICD-10-CM | POA: Diagnosis not present

## 2020-01-23 DIAGNOSIS — M1712 Unilateral primary osteoarthritis, left knee: Secondary | ICD-10-CM | POA: Diagnosis not present

## 2020-01-23 DIAGNOSIS — E785 Hyperlipidemia, unspecified: Secondary | ICD-10-CM | POA: Diagnosis not present

## 2020-01-23 DIAGNOSIS — I4819 Other persistent atrial fibrillation: Secondary | ICD-10-CM

## 2020-01-23 DIAGNOSIS — N1832 Chronic kidney disease, stage 3b: Secondary | ICD-10-CM | POA: Diagnosis not present

## 2020-01-23 DIAGNOSIS — E1122 Type 2 diabetes mellitus with diabetic chronic kidney disease: Secondary | ICD-10-CM | POA: Diagnosis not present

## 2020-01-23 DIAGNOSIS — I5032 Chronic diastolic (congestive) heart failure: Secondary | ICD-10-CM | POA: Diagnosis not present

## 2020-01-23 DIAGNOSIS — Z8616 Personal history of COVID-19: Secondary | ICD-10-CM | POA: Diagnosis not present

## 2020-01-23 DIAGNOSIS — E039 Hypothyroidism, unspecified: Secondary | ICD-10-CM | POA: Diagnosis not present

## 2020-01-23 DIAGNOSIS — T84195D Other mechanical complication of internal fixation device of left femur, subsequent encounter: Secondary | ICD-10-CM | POA: Diagnosis not present

## 2020-01-23 DIAGNOSIS — H9193 Unspecified hearing loss, bilateral: Secondary | ICD-10-CM | POA: Diagnosis not present

## 2020-01-23 DIAGNOSIS — K219 Gastro-esophageal reflux disease without esophagitis: Secondary | ICD-10-CM | POA: Diagnosis not present

## 2020-01-23 DIAGNOSIS — Z7901 Long term (current) use of anticoagulants: Secondary | ICD-10-CM | POA: Diagnosis not present

## 2020-01-23 DIAGNOSIS — E538 Deficiency of other specified B group vitamins: Secondary | ICD-10-CM | POA: Diagnosis not present

## 2020-01-23 DIAGNOSIS — E1169 Type 2 diabetes mellitus with other specified complication: Secondary | ICD-10-CM | POA: Diagnosis not present

## 2020-01-23 LAB — CUP PACEART REMOTE DEVICE CHECK
Date Time Interrogation Session: 20210606235059
Implantable Pulse Generator Implant Date: 20200819

## 2020-01-24 DIAGNOSIS — E1122 Type 2 diabetes mellitus with diabetic chronic kidney disease: Secondary | ICD-10-CM | POA: Diagnosis not present

## 2020-01-24 DIAGNOSIS — H9193 Unspecified hearing loss, bilateral: Secondary | ICD-10-CM | POA: Diagnosis not present

## 2020-01-24 DIAGNOSIS — E785 Hyperlipidemia, unspecified: Secondary | ICD-10-CM | POA: Diagnosis not present

## 2020-01-24 DIAGNOSIS — H539 Unspecified visual disturbance: Secondary | ICD-10-CM | POA: Diagnosis not present

## 2020-01-24 DIAGNOSIS — I130 Hypertensive heart and chronic kidney disease with heart failure and stage 1 through stage 4 chronic kidney disease, or unspecified chronic kidney disease: Secondary | ICD-10-CM | POA: Diagnosis not present

## 2020-01-24 DIAGNOSIS — K219 Gastro-esophageal reflux disease without esophagitis: Secondary | ICD-10-CM | POA: Diagnosis not present

## 2020-01-24 DIAGNOSIS — M1712 Unilateral primary osteoarthritis, left knee: Secondary | ICD-10-CM | POA: Diagnosis not present

## 2020-01-24 DIAGNOSIS — Z8616 Personal history of COVID-19: Secondary | ICD-10-CM | POA: Diagnosis not present

## 2020-01-24 DIAGNOSIS — K5900 Constipation, unspecified: Secondary | ICD-10-CM | POA: Diagnosis not present

## 2020-01-24 DIAGNOSIS — I5032 Chronic diastolic (congestive) heart failure: Secondary | ICD-10-CM | POA: Diagnosis not present

## 2020-01-24 DIAGNOSIS — E039 Hypothyroidism, unspecified: Secondary | ICD-10-CM | POA: Diagnosis not present

## 2020-01-24 DIAGNOSIS — D509 Iron deficiency anemia, unspecified: Secondary | ICD-10-CM | POA: Diagnosis not present

## 2020-01-24 DIAGNOSIS — Z7901 Long term (current) use of anticoagulants: Secondary | ICD-10-CM | POA: Diagnosis not present

## 2020-01-24 DIAGNOSIS — T84195D Other mechanical complication of internal fixation device of left femur, subsequent encounter: Secondary | ICD-10-CM | POA: Diagnosis not present

## 2020-01-24 DIAGNOSIS — E1169 Type 2 diabetes mellitus with other specified complication: Secondary | ICD-10-CM | POA: Diagnosis not present

## 2020-01-24 DIAGNOSIS — N1832 Chronic kidney disease, stage 3b: Secondary | ICD-10-CM | POA: Diagnosis not present

## 2020-01-24 DIAGNOSIS — R1314 Dysphagia, pharyngoesophageal phase: Secondary | ICD-10-CM | POA: Diagnosis not present

## 2020-01-24 DIAGNOSIS — I4819 Other persistent atrial fibrillation: Secondary | ICD-10-CM | POA: Diagnosis not present

## 2020-01-24 DIAGNOSIS — E1151 Type 2 diabetes mellitus with diabetic peripheral angiopathy without gangrene: Secondary | ICD-10-CM | POA: Diagnosis not present

## 2020-01-24 DIAGNOSIS — E538 Deficiency of other specified B group vitamins: Secondary | ICD-10-CM | POA: Diagnosis not present

## 2020-01-24 DIAGNOSIS — J310 Chronic rhinitis: Secondary | ICD-10-CM | POA: Diagnosis not present

## 2020-01-25 DIAGNOSIS — M1712 Unilateral primary osteoarthritis, left knee: Secondary | ICD-10-CM | POA: Diagnosis not present

## 2020-01-25 DIAGNOSIS — H9193 Unspecified hearing loss, bilateral: Secondary | ICD-10-CM | POA: Diagnosis not present

## 2020-01-25 DIAGNOSIS — K219 Gastro-esophageal reflux disease without esophagitis: Secondary | ICD-10-CM | POA: Diagnosis not present

## 2020-01-25 DIAGNOSIS — Z8616 Personal history of COVID-19: Secondary | ICD-10-CM | POA: Diagnosis not present

## 2020-01-25 DIAGNOSIS — E1169 Type 2 diabetes mellitus with other specified complication: Secondary | ICD-10-CM | POA: Diagnosis not present

## 2020-01-25 DIAGNOSIS — E538 Deficiency of other specified B group vitamins: Secondary | ICD-10-CM | POA: Diagnosis not present

## 2020-01-25 DIAGNOSIS — R1314 Dysphagia, pharyngoesophageal phase: Secondary | ICD-10-CM | POA: Diagnosis not present

## 2020-01-25 DIAGNOSIS — I4819 Other persistent atrial fibrillation: Secondary | ICD-10-CM | POA: Diagnosis not present

## 2020-01-25 DIAGNOSIS — T84195D Other mechanical complication of internal fixation device of left femur, subsequent encounter: Secondary | ICD-10-CM | POA: Diagnosis not present

## 2020-01-25 DIAGNOSIS — K5900 Constipation, unspecified: Secondary | ICD-10-CM | POA: Diagnosis not present

## 2020-01-25 DIAGNOSIS — E785 Hyperlipidemia, unspecified: Secondary | ICD-10-CM | POA: Diagnosis not present

## 2020-01-25 DIAGNOSIS — J310 Chronic rhinitis: Secondary | ICD-10-CM | POA: Diagnosis not present

## 2020-01-25 DIAGNOSIS — I130 Hypertensive heart and chronic kidney disease with heart failure and stage 1 through stage 4 chronic kidney disease, or unspecified chronic kidney disease: Secondary | ICD-10-CM | POA: Diagnosis not present

## 2020-01-25 DIAGNOSIS — H539 Unspecified visual disturbance: Secondary | ICD-10-CM | POA: Diagnosis not present

## 2020-01-25 DIAGNOSIS — E1122 Type 2 diabetes mellitus with diabetic chronic kidney disease: Secondary | ICD-10-CM | POA: Diagnosis not present

## 2020-01-25 DIAGNOSIS — I5032 Chronic diastolic (congestive) heart failure: Secondary | ICD-10-CM | POA: Diagnosis not present

## 2020-01-25 DIAGNOSIS — N1832 Chronic kidney disease, stage 3b: Secondary | ICD-10-CM | POA: Diagnosis not present

## 2020-01-25 DIAGNOSIS — E1151 Type 2 diabetes mellitus with diabetic peripheral angiopathy without gangrene: Secondary | ICD-10-CM | POA: Diagnosis not present

## 2020-01-25 DIAGNOSIS — E039 Hypothyroidism, unspecified: Secondary | ICD-10-CM | POA: Diagnosis not present

## 2020-01-25 DIAGNOSIS — D509 Iron deficiency anemia, unspecified: Secondary | ICD-10-CM | POA: Diagnosis not present

## 2020-01-25 DIAGNOSIS — Z7901 Long term (current) use of anticoagulants: Secondary | ICD-10-CM | POA: Diagnosis not present

## 2020-01-25 NOTE — Progress Notes (Signed)
Carelink Summary Report / Loop Recorder 

## 2020-01-27 DIAGNOSIS — N1832 Chronic kidney disease, stage 3b: Secondary | ICD-10-CM | POA: Diagnosis not present

## 2020-01-27 DIAGNOSIS — R1314 Dysphagia, pharyngoesophageal phase: Secondary | ICD-10-CM | POA: Diagnosis not present

## 2020-01-27 DIAGNOSIS — Z8616 Personal history of COVID-19: Secondary | ICD-10-CM | POA: Diagnosis not present

## 2020-01-27 DIAGNOSIS — I4819 Other persistent atrial fibrillation: Secondary | ICD-10-CM | POA: Diagnosis not present

## 2020-01-27 DIAGNOSIS — I130 Hypertensive heart and chronic kidney disease with heart failure and stage 1 through stage 4 chronic kidney disease, or unspecified chronic kidney disease: Secondary | ICD-10-CM | POA: Diagnosis not present

## 2020-01-27 DIAGNOSIS — J310 Chronic rhinitis: Secondary | ICD-10-CM | POA: Diagnosis not present

## 2020-01-27 DIAGNOSIS — D509 Iron deficiency anemia, unspecified: Secondary | ICD-10-CM | POA: Diagnosis not present

## 2020-01-27 DIAGNOSIS — E1169 Type 2 diabetes mellitus with other specified complication: Secondary | ICD-10-CM | POA: Diagnosis not present

## 2020-01-27 DIAGNOSIS — E1151 Type 2 diabetes mellitus with diabetic peripheral angiopathy without gangrene: Secondary | ICD-10-CM | POA: Diagnosis not present

## 2020-01-27 DIAGNOSIS — Z7901 Long term (current) use of anticoagulants: Secondary | ICD-10-CM | POA: Diagnosis not present

## 2020-01-27 DIAGNOSIS — H539 Unspecified visual disturbance: Secondary | ICD-10-CM | POA: Diagnosis not present

## 2020-01-27 DIAGNOSIS — E538 Deficiency of other specified B group vitamins: Secondary | ICD-10-CM | POA: Diagnosis not present

## 2020-01-27 DIAGNOSIS — M1712 Unilateral primary osteoarthritis, left knee: Secondary | ICD-10-CM | POA: Diagnosis not present

## 2020-01-27 DIAGNOSIS — E785 Hyperlipidemia, unspecified: Secondary | ICD-10-CM | POA: Diagnosis not present

## 2020-01-27 DIAGNOSIS — T84195D Other mechanical complication of internal fixation device of left femur, subsequent encounter: Secondary | ICD-10-CM | POA: Diagnosis not present

## 2020-01-27 DIAGNOSIS — K5900 Constipation, unspecified: Secondary | ICD-10-CM | POA: Diagnosis not present

## 2020-01-27 DIAGNOSIS — E039 Hypothyroidism, unspecified: Secondary | ICD-10-CM | POA: Diagnosis not present

## 2020-01-27 DIAGNOSIS — E1122 Type 2 diabetes mellitus with diabetic chronic kidney disease: Secondary | ICD-10-CM | POA: Diagnosis not present

## 2020-01-27 DIAGNOSIS — I5032 Chronic diastolic (congestive) heart failure: Secondary | ICD-10-CM | POA: Diagnosis not present

## 2020-01-27 DIAGNOSIS — K219 Gastro-esophageal reflux disease without esophagitis: Secondary | ICD-10-CM | POA: Diagnosis not present

## 2020-01-27 DIAGNOSIS — H9193 Unspecified hearing loss, bilateral: Secondary | ICD-10-CM | POA: Diagnosis not present

## 2020-01-30 DIAGNOSIS — M1712 Unilateral primary osteoarthritis, left knee: Secondary | ICD-10-CM | POA: Diagnosis not present

## 2020-01-30 DIAGNOSIS — I5032 Chronic diastolic (congestive) heart failure: Secondary | ICD-10-CM | POA: Diagnosis not present

## 2020-01-30 DIAGNOSIS — H539 Unspecified visual disturbance: Secondary | ICD-10-CM | POA: Diagnosis not present

## 2020-01-30 DIAGNOSIS — Z8616 Personal history of COVID-19: Secondary | ICD-10-CM | POA: Diagnosis not present

## 2020-01-30 DIAGNOSIS — E1169 Type 2 diabetes mellitus with other specified complication: Secondary | ICD-10-CM | POA: Diagnosis not present

## 2020-01-30 DIAGNOSIS — H9193 Unspecified hearing loss, bilateral: Secondary | ICD-10-CM | POA: Diagnosis not present

## 2020-01-30 DIAGNOSIS — E039 Hypothyroidism, unspecified: Secondary | ICD-10-CM | POA: Diagnosis not present

## 2020-01-30 DIAGNOSIS — E1122 Type 2 diabetes mellitus with diabetic chronic kidney disease: Secondary | ICD-10-CM | POA: Diagnosis not present

## 2020-01-30 DIAGNOSIS — R1314 Dysphagia, pharyngoesophageal phase: Secondary | ICD-10-CM | POA: Diagnosis not present

## 2020-01-30 DIAGNOSIS — T84195D Other mechanical complication of internal fixation device of left femur, subsequent encounter: Secondary | ICD-10-CM | POA: Diagnosis not present

## 2020-01-30 DIAGNOSIS — E1151 Type 2 diabetes mellitus with diabetic peripheral angiopathy without gangrene: Secondary | ICD-10-CM | POA: Diagnosis not present

## 2020-01-30 DIAGNOSIS — J310 Chronic rhinitis: Secondary | ICD-10-CM | POA: Diagnosis not present

## 2020-01-30 DIAGNOSIS — Z7901 Long term (current) use of anticoagulants: Secondary | ICD-10-CM | POA: Diagnosis not present

## 2020-01-30 DIAGNOSIS — N1832 Chronic kidney disease, stage 3b: Secondary | ICD-10-CM | POA: Diagnosis not present

## 2020-01-30 DIAGNOSIS — I4819 Other persistent atrial fibrillation: Secondary | ICD-10-CM | POA: Diagnosis not present

## 2020-01-30 DIAGNOSIS — E538 Deficiency of other specified B group vitamins: Secondary | ICD-10-CM | POA: Diagnosis not present

## 2020-01-30 DIAGNOSIS — E785 Hyperlipidemia, unspecified: Secondary | ICD-10-CM | POA: Diagnosis not present

## 2020-01-30 DIAGNOSIS — I130 Hypertensive heart and chronic kidney disease with heart failure and stage 1 through stage 4 chronic kidney disease, or unspecified chronic kidney disease: Secondary | ICD-10-CM | POA: Diagnosis not present

## 2020-01-30 DIAGNOSIS — K5900 Constipation, unspecified: Secondary | ICD-10-CM | POA: Diagnosis not present

## 2020-01-30 DIAGNOSIS — D509 Iron deficiency anemia, unspecified: Secondary | ICD-10-CM | POA: Diagnosis not present

## 2020-01-30 DIAGNOSIS — K219 Gastro-esophageal reflux disease without esophagitis: Secondary | ICD-10-CM | POA: Diagnosis not present

## 2020-02-02 DIAGNOSIS — Z8616 Personal history of COVID-19: Secondary | ICD-10-CM | POA: Diagnosis not present

## 2020-02-02 DIAGNOSIS — K5900 Constipation, unspecified: Secondary | ICD-10-CM | POA: Diagnosis not present

## 2020-02-02 DIAGNOSIS — H9193 Unspecified hearing loss, bilateral: Secondary | ICD-10-CM | POA: Diagnosis not present

## 2020-02-02 DIAGNOSIS — I130 Hypertensive heart and chronic kidney disease with heart failure and stage 1 through stage 4 chronic kidney disease, or unspecified chronic kidney disease: Secondary | ICD-10-CM | POA: Diagnosis not present

## 2020-02-02 DIAGNOSIS — E785 Hyperlipidemia, unspecified: Secondary | ICD-10-CM | POA: Diagnosis not present

## 2020-02-02 DIAGNOSIS — I5032 Chronic diastolic (congestive) heart failure: Secondary | ICD-10-CM | POA: Diagnosis not present

## 2020-02-02 DIAGNOSIS — R1314 Dysphagia, pharyngoesophageal phase: Secondary | ICD-10-CM | POA: Diagnosis not present

## 2020-02-02 DIAGNOSIS — E039 Hypothyroidism, unspecified: Secondary | ICD-10-CM | POA: Diagnosis not present

## 2020-02-02 DIAGNOSIS — E1122 Type 2 diabetes mellitus with diabetic chronic kidney disease: Secondary | ICD-10-CM | POA: Diagnosis not present

## 2020-02-02 DIAGNOSIS — M1712 Unilateral primary osteoarthritis, left knee: Secondary | ICD-10-CM | POA: Diagnosis not present

## 2020-02-02 DIAGNOSIS — Z7901 Long term (current) use of anticoagulants: Secondary | ICD-10-CM | POA: Diagnosis not present

## 2020-02-02 DIAGNOSIS — J310 Chronic rhinitis: Secondary | ICD-10-CM | POA: Diagnosis not present

## 2020-02-02 DIAGNOSIS — H539 Unspecified visual disturbance: Secondary | ICD-10-CM | POA: Diagnosis not present

## 2020-02-02 DIAGNOSIS — I4819 Other persistent atrial fibrillation: Secondary | ICD-10-CM | POA: Diagnosis not present

## 2020-02-02 DIAGNOSIS — N1832 Chronic kidney disease, stage 3b: Secondary | ICD-10-CM | POA: Diagnosis not present

## 2020-02-02 DIAGNOSIS — E538 Deficiency of other specified B group vitamins: Secondary | ICD-10-CM | POA: Diagnosis not present

## 2020-02-02 DIAGNOSIS — E1169 Type 2 diabetes mellitus with other specified complication: Secondary | ICD-10-CM | POA: Diagnosis not present

## 2020-02-02 DIAGNOSIS — T84195D Other mechanical complication of internal fixation device of left femur, subsequent encounter: Secondary | ICD-10-CM | POA: Diagnosis not present

## 2020-02-02 DIAGNOSIS — D509 Iron deficiency anemia, unspecified: Secondary | ICD-10-CM | POA: Diagnosis not present

## 2020-02-02 DIAGNOSIS — K219 Gastro-esophageal reflux disease without esophagitis: Secondary | ICD-10-CM | POA: Diagnosis not present

## 2020-02-02 DIAGNOSIS — E1151 Type 2 diabetes mellitus with diabetic peripheral angiopathy without gangrene: Secondary | ICD-10-CM | POA: Diagnosis not present

## 2020-02-06 DIAGNOSIS — R1314 Dysphagia, pharyngoesophageal phase: Secondary | ICD-10-CM | POA: Diagnosis not present

## 2020-02-06 DIAGNOSIS — M1712 Unilateral primary osteoarthritis, left knee: Secondary | ICD-10-CM | POA: Diagnosis not present

## 2020-02-06 DIAGNOSIS — D509 Iron deficiency anemia, unspecified: Secondary | ICD-10-CM | POA: Diagnosis not present

## 2020-02-06 DIAGNOSIS — N1832 Chronic kidney disease, stage 3b: Secondary | ICD-10-CM | POA: Diagnosis not present

## 2020-02-06 DIAGNOSIS — I130 Hypertensive heart and chronic kidney disease with heart failure and stage 1 through stage 4 chronic kidney disease, or unspecified chronic kidney disease: Secondary | ICD-10-CM | POA: Diagnosis not present

## 2020-02-06 DIAGNOSIS — I4819 Other persistent atrial fibrillation: Secondary | ICD-10-CM | POA: Diagnosis not present

## 2020-02-06 DIAGNOSIS — E538 Deficiency of other specified B group vitamins: Secondary | ICD-10-CM | POA: Diagnosis not present

## 2020-02-06 DIAGNOSIS — K219 Gastro-esophageal reflux disease without esophagitis: Secondary | ICD-10-CM | POA: Diagnosis not present

## 2020-02-06 DIAGNOSIS — J310 Chronic rhinitis: Secondary | ICD-10-CM | POA: Diagnosis not present

## 2020-02-06 DIAGNOSIS — Z7901 Long term (current) use of anticoagulants: Secondary | ICD-10-CM | POA: Diagnosis not present

## 2020-02-06 DIAGNOSIS — E1122 Type 2 diabetes mellitus with diabetic chronic kidney disease: Secondary | ICD-10-CM | POA: Diagnosis not present

## 2020-02-06 DIAGNOSIS — H539 Unspecified visual disturbance: Secondary | ICD-10-CM | POA: Diagnosis not present

## 2020-02-06 DIAGNOSIS — E1151 Type 2 diabetes mellitus with diabetic peripheral angiopathy without gangrene: Secondary | ICD-10-CM | POA: Diagnosis not present

## 2020-02-06 DIAGNOSIS — E785 Hyperlipidemia, unspecified: Secondary | ICD-10-CM | POA: Diagnosis not present

## 2020-02-06 DIAGNOSIS — H9193 Unspecified hearing loss, bilateral: Secondary | ICD-10-CM | POA: Diagnosis not present

## 2020-02-06 DIAGNOSIS — T84195D Other mechanical complication of internal fixation device of left femur, subsequent encounter: Secondary | ICD-10-CM | POA: Diagnosis not present

## 2020-02-06 DIAGNOSIS — I5032 Chronic diastolic (congestive) heart failure: Secondary | ICD-10-CM | POA: Diagnosis not present

## 2020-02-06 DIAGNOSIS — E1169 Type 2 diabetes mellitus with other specified complication: Secondary | ICD-10-CM | POA: Diagnosis not present

## 2020-02-06 DIAGNOSIS — K5900 Constipation, unspecified: Secondary | ICD-10-CM | POA: Diagnosis not present

## 2020-02-06 DIAGNOSIS — E039 Hypothyroidism, unspecified: Secondary | ICD-10-CM | POA: Diagnosis not present

## 2020-02-06 DIAGNOSIS — Z8616 Personal history of COVID-19: Secondary | ICD-10-CM | POA: Diagnosis not present

## 2020-02-07 DIAGNOSIS — E785 Hyperlipidemia, unspecified: Secondary | ICD-10-CM | POA: Diagnosis not present

## 2020-02-07 DIAGNOSIS — I130 Hypertensive heart and chronic kidney disease with heart failure and stage 1 through stage 4 chronic kidney disease, or unspecified chronic kidney disease: Secondary | ICD-10-CM | POA: Diagnosis not present

## 2020-02-07 DIAGNOSIS — E038 Other specified hypothyroidism: Secondary | ICD-10-CM | POA: Diagnosis not present

## 2020-02-07 DIAGNOSIS — E118 Type 2 diabetes mellitus with unspecified complications: Secondary | ICD-10-CM | POA: Diagnosis not present

## 2020-02-07 DIAGNOSIS — Z0001 Encounter for general adult medical examination with abnormal findings: Secondary | ICD-10-CM | POA: Diagnosis not present

## 2020-02-08 DIAGNOSIS — K5900 Constipation, unspecified: Secondary | ICD-10-CM | POA: Diagnosis not present

## 2020-02-08 DIAGNOSIS — K219 Gastro-esophageal reflux disease without esophagitis: Secondary | ICD-10-CM | POA: Diagnosis not present

## 2020-02-08 DIAGNOSIS — N1832 Chronic kidney disease, stage 3b: Secondary | ICD-10-CM | POA: Diagnosis not present

## 2020-02-08 DIAGNOSIS — E1169 Type 2 diabetes mellitus with other specified complication: Secondary | ICD-10-CM | POA: Diagnosis not present

## 2020-02-08 DIAGNOSIS — I130 Hypertensive heart and chronic kidney disease with heart failure and stage 1 through stage 4 chronic kidney disease, or unspecified chronic kidney disease: Secondary | ICD-10-CM | POA: Diagnosis not present

## 2020-02-08 DIAGNOSIS — R1314 Dysphagia, pharyngoesophageal phase: Secondary | ICD-10-CM | POA: Diagnosis not present

## 2020-02-08 DIAGNOSIS — J310 Chronic rhinitis: Secondary | ICD-10-CM | POA: Diagnosis not present

## 2020-02-08 DIAGNOSIS — T84195D Other mechanical complication of internal fixation device of left femur, subsequent encounter: Secondary | ICD-10-CM | POA: Diagnosis not present

## 2020-02-08 DIAGNOSIS — H9193 Unspecified hearing loss, bilateral: Secondary | ICD-10-CM | POA: Diagnosis not present

## 2020-02-08 DIAGNOSIS — E1151 Type 2 diabetes mellitus with diabetic peripheral angiopathy without gangrene: Secondary | ICD-10-CM | POA: Diagnosis not present

## 2020-02-08 DIAGNOSIS — M1712 Unilateral primary osteoarthritis, left knee: Secondary | ICD-10-CM | POA: Diagnosis not present

## 2020-02-08 DIAGNOSIS — E1122 Type 2 diabetes mellitus with diabetic chronic kidney disease: Secondary | ICD-10-CM | POA: Diagnosis not present

## 2020-02-08 DIAGNOSIS — I5032 Chronic diastolic (congestive) heart failure: Secondary | ICD-10-CM | POA: Diagnosis not present

## 2020-02-08 DIAGNOSIS — D509 Iron deficiency anemia, unspecified: Secondary | ICD-10-CM | POA: Diagnosis not present

## 2020-02-08 DIAGNOSIS — I4819 Other persistent atrial fibrillation: Secondary | ICD-10-CM | POA: Diagnosis not present

## 2020-02-08 DIAGNOSIS — E039 Hypothyroidism, unspecified: Secondary | ICD-10-CM | POA: Diagnosis not present

## 2020-02-08 DIAGNOSIS — Z7901 Long term (current) use of anticoagulants: Secondary | ICD-10-CM | POA: Diagnosis not present

## 2020-02-08 DIAGNOSIS — H539 Unspecified visual disturbance: Secondary | ICD-10-CM | POA: Diagnosis not present

## 2020-02-08 DIAGNOSIS — E785 Hyperlipidemia, unspecified: Secondary | ICD-10-CM | POA: Diagnosis not present

## 2020-02-08 DIAGNOSIS — E538 Deficiency of other specified B group vitamins: Secondary | ICD-10-CM | POA: Diagnosis not present

## 2020-02-08 DIAGNOSIS — Z8616 Personal history of COVID-19: Secondary | ICD-10-CM | POA: Diagnosis not present

## 2020-02-09 DIAGNOSIS — I4819 Other persistent atrial fibrillation: Secondary | ICD-10-CM | POA: Diagnosis not present

## 2020-02-09 DIAGNOSIS — E039 Hypothyroidism, unspecified: Secondary | ICD-10-CM | POA: Diagnosis not present

## 2020-02-09 DIAGNOSIS — E1122 Type 2 diabetes mellitus with diabetic chronic kidney disease: Secondary | ICD-10-CM | POA: Diagnosis not present

## 2020-02-09 DIAGNOSIS — Z7901 Long term (current) use of anticoagulants: Secondary | ICD-10-CM | POA: Diagnosis not present

## 2020-02-09 DIAGNOSIS — H539 Unspecified visual disturbance: Secondary | ICD-10-CM | POA: Diagnosis not present

## 2020-02-09 DIAGNOSIS — K5900 Constipation, unspecified: Secondary | ICD-10-CM | POA: Diagnosis not present

## 2020-02-09 DIAGNOSIS — T84195D Other mechanical complication of internal fixation device of left femur, subsequent encounter: Secondary | ICD-10-CM | POA: Diagnosis not present

## 2020-02-09 DIAGNOSIS — R1314 Dysphagia, pharyngoesophageal phase: Secondary | ICD-10-CM | POA: Diagnosis not present

## 2020-02-09 DIAGNOSIS — I130 Hypertensive heart and chronic kidney disease with heart failure and stage 1 through stage 4 chronic kidney disease, or unspecified chronic kidney disease: Secondary | ICD-10-CM | POA: Diagnosis not present

## 2020-02-09 DIAGNOSIS — E785 Hyperlipidemia, unspecified: Secondary | ICD-10-CM | POA: Diagnosis not present

## 2020-02-09 DIAGNOSIS — N1832 Chronic kidney disease, stage 3b: Secondary | ICD-10-CM | POA: Diagnosis not present

## 2020-02-09 DIAGNOSIS — K219 Gastro-esophageal reflux disease without esophagitis: Secondary | ICD-10-CM | POA: Diagnosis not present

## 2020-02-09 DIAGNOSIS — Z8616 Personal history of COVID-19: Secondary | ICD-10-CM | POA: Diagnosis not present

## 2020-02-09 DIAGNOSIS — E538 Deficiency of other specified B group vitamins: Secondary | ICD-10-CM | POA: Diagnosis not present

## 2020-02-09 DIAGNOSIS — I5032 Chronic diastolic (congestive) heart failure: Secondary | ICD-10-CM | POA: Diagnosis not present

## 2020-02-09 DIAGNOSIS — H9193 Unspecified hearing loss, bilateral: Secondary | ICD-10-CM | POA: Diagnosis not present

## 2020-02-09 DIAGNOSIS — D509 Iron deficiency anemia, unspecified: Secondary | ICD-10-CM | POA: Diagnosis not present

## 2020-02-09 DIAGNOSIS — M1712 Unilateral primary osteoarthritis, left knee: Secondary | ICD-10-CM | POA: Diagnosis not present

## 2020-02-09 DIAGNOSIS — J310 Chronic rhinitis: Secondary | ICD-10-CM | POA: Diagnosis not present

## 2020-02-09 DIAGNOSIS — E1151 Type 2 diabetes mellitus with diabetic peripheral angiopathy without gangrene: Secondary | ICD-10-CM | POA: Diagnosis not present

## 2020-02-09 DIAGNOSIS — E1169 Type 2 diabetes mellitus with other specified complication: Secondary | ICD-10-CM | POA: Diagnosis not present

## 2020-02-10 DIAGNOSIS — I4819 Other persistent atrial fibrillation: Secondary | ICD-10-CM | POA: Diagnosis not present

## 2020-02-10 DIAGNOSIS — N1832 Chronic kidney disease, stage 3b: Secondary | ICD-10-CM | POA: Diagnosis not present

## 2020-02-10 DIAGNOSIS — J310 Chronic rhinitis: Secondary | ICD-10-CM | POA: Diagnosis not present

## 2020-02-10 DIAGNOSIS — E1122 Type 2 diabetes mellitus with diabetic chronic kidney disease: Secondary | ICD-10-CM | POA: Diagnosis not present

## 2020-02-10 DIAGNOSIS — I5032 Chronic diastolic (congestive) heart failure: Secondary | ICD-10-CM | POA: Diagnosis not present

## 2020-02-10 DIAGNOSIS — E1169 Type 2 diabetes mellitus with other specified complication: Secondary | ICD-10-CM | POA: Diagnosis not present

## 2020-02-10 DIAGNOSIS — K5900 Constipation, unspecified: Secondary | ICD-10-CM | POA: Diagnosis not present

## 2020-02-10 DIAGNOSIS — H539 Unspecified visual disturbance: Secondary | ICD-10-CM | POA: Diagnosis not present

## 2020-02-10 DIAGNOSIS — E785 Hyperlipidemia, unspecified: Secondary | ICD-10-CM | POA: Diagnosis not present

## 2020-02-10 DIAGNOSIS — R1314 Dysphagia, pharyngoesophageal phase: Secondary | ICD-10-CM | POA: Diagnosis not present

## 2020-02-10 DIAGNOSIS — K219 Gastro-esophageal reflux disease without esophagitis: Secondary | ICD-10-CM | POA: Diagnosis not present

## 2020-02-10 DIAGNOSIS — Z7901 Long term (current) use of anticoagulants: Secondary | ICD-10-CM | POA: Diagnosis not present

## 2020-02-10 DIAGNOSIS — T84195D Other mechanical complication of internal fixation device of left femur, subsequent encounter: Secondary | ICD-10-CM | POA: Diagnosis not present

## 2020-02-10 DIAGNOSIS — H9193 Unspecified hearing loss, bilateral: Secondary | ICD-10-CM | POA: Diagnosis not present

## 2020-02-10 DIAGNOSIS — I130 Hypertensive heart and chronic kidney disease with heart failure and stage 1 through stage 4 chronic kidney disease, or unspecified chronic kidney disease: Secondary | ICD-10-CM | POA: Diagnosis not present

## 2020-02-10 DIAGNOSIS — Z8616 Personal history of COVID-19: Secondary | ICD-10-CM | POA: Diagnosis not present

## 2020-02-10 DIAGNOSIS — E039 Hypothyroidism, unspecified: Secondary | ICD-10-CM | POA: Diagnosis not present

## 2020-02-10 DIAGNOSIS — E538 Deficiency of other specified B group vitamins: Secondary | ICD-10-CM | POA: Diagnosis not present

## 2020-02-10 DIAGNOSIS — D509 Iron deficiency anemia, unspecified: Secondary | ICD-10-CM | POA: Diagnosis not present

## 2020-02-10 DIAGNOSIS — E1151 Type 2 diabetes mellitus with diabetic peripheral angiopathy without gangrene: Secondary | ICD-10-CM | POA: Diagnosis not present

## 2020-02-10 DIAGNOSIS — M1712 Unilateral primary osteoarthritis, left knee: Secondary | ICD-10-CM | POA: Diagnosis not present

## 2020-02-15 DIAGNOSIS — S7292XA Unspecified fracture of left femur, initial encounter for closed fracture: Secondary | ICD-10-CM | POA: Diagnosis not present

## 2020-02-21 DIAGNOSIS — E118 Type 2 diabetes mellitus with unspecified complications: Secondary | ICD-10-CM | POA: Diagnosis not present

## 2020-02-21 DIAGNOSIS — I4819 Other persistent atrial fibrillation: Secondary | ICD-10-CM | POA: Diagnosis not present

## 2020-02-21 DIAGNOSIS — I130 Hypertensive heart and chronic kidney disease with heart failure and stage 1 through stage 4 chronic kidney disease, or unspecified chronic kidney disease: Secondary | ICD-10-CM | POA: Diagnosis not present

## 2020-02-21 DIAGNOSIS — E782 Mixed hyperlipidemia: Secondary | ICD-10-CM | POA: Diagnosis not present

## 2020-02-21 DIAGNOSIS — I5032 Chronic diastolic (congestive) heart failure: Secondary | ICD-10-CM | POA: Diagnosis not present

## 2020-02-23 ENCOUNTER — Ambulatory Visit (INDEPENDENT_AMBULATORY_CARE_PROVIDER_SITE_OTHER): Payer: Medicare Other | Admitting: *Deleted

## 2020-02-23 DIAGNOSIS — I4819 Other persistent atrial fibrillation: Secondary | ICD-10-CM

## 2020-02-23 LAB — CUP PACEART REMOTE DEVICE CHECK
Date Time Interrogation Session: 20210707233321
Implantable Pulse Generator Implant Date: 20200819

## 2020-02-27 NOTE — Progress Notes (Signed)
Carelink Summary Report / Loop Recorder 

## 2020-03-20 ENCOUNTER — Encounter: Payer: Self-pay | Admitting: Nurse Practitioner

## 2020-03-20 ENCOUNTER — Other Ambulatory Visit: Payer: Self-pay

## 2020-03-20 ENCOUNTER — Ambulatory Visit (INDEPENDENT_AMBULATORY_CARE_PROVIDER_SITE_OTHER): Payer: Medicare Other | Admitting: Nurse Practitioner

## 2020-03-20 VITALS — BP 139/88 | HR 99 | Temp 97.9°F | Resp 20 | Ht 66.0 in | Wt 138.0 lb

## 2020-03-20 DIAGNOSIS — R609 Edema, unspecified: Secondary | ICD-10-CM

## 2020-03-20 DIAGNOSIS — M8580 Other specified disorders of bone density and structure, unspecified site: Secondary | ICD-10-CM

## 2020-03-20 DIAGNOSIS — E119 Type 2 diabetes mellitus without complications: Secondary | ICD-10-CM

## 2020-03-20 DIAGNOSIS — E039 Hypothyroidism, unspecified: Secondary | ICD-10-CM

## 2020-03-20 DIAGNOSIS — K219 Gastro-esophageal reflux disease without esophagitis: Secondary | ICD-10-CM | POA: Diagnosis not present

## 2020-03-20 DIAGNOSIS — N1832 Chronic kidney disease, stage 3b: Secondary | ICD-10-CM

## 2020-03-20 DIAGNOSIS — E782 Mixed hyperlipidemia: Secondary | ICD-10-CM | POA: Diagnosis not present

## 2020-03-20 DIAGNOSIS — I10 Essential (primary) hypertension: Secondary | ICD-10-CM | POA: Diagnosis not present

## 2020-03-20 DIAGNOSIS — F5101 Primary insomnia: Secondary | ICD-10-CM

## 2020-03-20 DIAGNOSIS — I4819 Other persistent atrial fibrillation: Secondary | ICD-10-CM

## 2020-03-20 DIAGNOSIS — E876 Hypokalemia: Secondary | ICD-10-CM

## 2020-03-20 LAB — BAYER DCA HB A1C WAIVED: HB A1C (BAYER DCA - WAIVED): 6.2 % (ref <7.0)

## 2020-03-20 MED ORDER — FAMOTIDINE 20 MG PO TABS: 20.0000 mg | ORAL_TABLET | Freq: Every day | ORAL | 1 refills | Status: DC

## 2020-03-20 MED ORDER — BENAZEPRIL HCL 5 MG PO TABS: 5.0000 mg | ORAL_TABLET | Freq: Every day | ORAL | 0 refills | Status: DC

## 2020-03-20 MED ORDER — RIVAROXABAN 15 MG PO TABS: 15.0000 mg | ORAL_TABLET | Freq: Every day | ORAL | 11 refills | Status: DC

## 2020-03-20 MED ORDER — LEVOTHYROXINE SODIUM 50 MCG PO TABS: 50.0000 ug | ORAL_TABLET | Freq: Every day | ORAL | 1 refills | Status: DC

## 2020-03-20 MED ORDER — FUROSEMIDE 40 MG PO TABS: 40.0000 mg | ORAL_TABLET | Freq: Every day | ORAL | 1 refills | Status: DC

## 2020-03-20 MED ORDER — CLONIDINE HCL 0.1 MG PO TABS: 0.1000 mg | ORAL_TABLET | Freq: Every day | ORAL | 0 refills | Status: DC

## 2020-03-20 MED ORDER — PANTOPRAZOLE SODIUM 40 MG PO TBEC: 40.0000 mg | DELAYED_RELEASE_TABLET | Freq: Every day | ORAL | 0 refills | Status: DC

## 2020-03-20 MED ORDER — RALOXIFENE HCL 60 MG PO TABS: 60.0000 mg | ORAL_TABLET | Freq: Every day | ORAL | 0 refills | Status: DC

## 2020-03-20 MED ORDER — LOVASTATIN 40 MG PO TABS: 40.0000 mg | ORAL_TABLET | Freq: Every day | ORAL | 0 refills | Status: DC

## 2020-03-20 MED ORDER — POTASSIUM CHLORIDE CRYS ER 20 MEQ PO TBCR: 20.0000 meq | EXTENDED_RELEASE_TABLET | Freq: Every day | ORAL | 1 refills | Status: DC

## 2020-03-20 NOTE — Progress Notes (Signed)
Subjective:    Patient ID: Katie [name], female    DOB: 01/29/1932, 86 y.o.   MRN: [id number]   medical management of chronic issues      HPI:  1. Essential hypertension No c/o chest pain, sob or headache. Does not check blood pressure at home. BP Readings from Last 3 Encounters:  03/20/20 139/88  09/28/19 (!) 140/53  09/13/19 (!) 147/79     2. Mixed hyperlipidemia Doe stry to watch diet but is not able to do exercises due to hip fracture repair. Lab Results  Component Value Date   CHOL 156 04/22/2019   HDL 55 04/22/2019   LDLCALC 77 04/22/2019   TRIG 136 04/22/2019   CHOLHDL 2.8 04/22/2019     3. Diabetes mellitus type 2, diet-controlled (HCC) Fasting blood sugars are not checked at home. She doe stry to watch diet. Lab Results  Component Value Date   HGBA1C 7.2 (H) 04/22/2019     4. Persistent atrial fibrillation (HCC) Denies any palpitation or heart racing.  5. Gastroesophageal reflux disease without esophagitis Is on protonix which keeps her symptoms under control  6. Acquired hypothyroidism No problems that she is aware of. Lab Results  Component Value Date   TSH 2.162 09/24/2019     7. Hypokalemia No lower ext cramping Lab Results  Component Value Date   K 4.6 09/27/2019     8. Peripheral edema No edema recently  9. Primary insomnia Sleeps about 7-8 a night  10. Stage 3b chronic kidney disease Lab Results  Component Value Date   CREATININE 1.06 (H) 09/27/2019   BUN 13 09/27/2019   NA 140 09/27/2019   K 4.6 09/27/2019   CL 105 09/27/2019   CO2 28 09/27/2019       Outpatient Encounter Medications as of 03/20/2020  Medication Sig  . AMBULATORY NON FORMULARY MEDICATION Swish and swallow 30 mLs 4 (four) times daily as needed (mouth pain). GI cocktail 90 ml viscous lidocaine, 78ml-10mg /25ml dicyclomine, 263ml maalox Sig: Swish and swallow 84ml's by mouth four times daily  . benazepril (LOTENSIN) 5 MG tablet Take 1 tablet (5 mg  total) by mouth daily.  . fluticasone (FLONASE) 50 MCG/ACT nasal spray Place 2 sprays into both nostrils daily.  . furosemide (LASIX) 40 MG tablet Take 1 tablet (40 mg total) by mouth daily.  [name] loratadine (CLARITIN) 10 MG tablet Take 10 mg by mouth daily as needed for allergies.  [name] lovastatin (MEVACOR) 40 MG tablet Take 1 tablet (40 mg total) by mouth at bedtime.  [name] albuterol (VENTOLIN HFA) 108 (90 Base) MCG/ACT inhaler Inhale 2 puffs into the lungs every 6 (six) hours as needed for wheezing or shortness of breath.  (Patient not taking: Reported on 03/20/2020)  . Cholecalciferol (VITAMIN D-3) 125 MCG (5000 UT) TABS Take 5,000 Units by mouth daily.  . cloNIDine (CATAPRES) 0.1 MG tablet Take 1 tablet (0.1 mg total) by mouth at bedtime.  . feeding supplement, ENSURE ENLIVE, (ENSURE ENLIVE) LIQD Take 237 mLs by mouth 2 (two) times daily between meals.  . Multiple Vitamin (MULTIVITAMIN WITH MINERALS) TABS tablet Take 1 tablet by mouth daily.  . pantoprazole (PROTONIX) 40 MG tablet Take 1 tablet (40 mg total) by mouth daily.  . polyethylene glycol (MIRALAX / GLYCOLAX) 17 g packet Take 17 g by mouth daily as needed for mild constipation.  . potassium chloride SA (KLOR-CON) 20 MEQ tablet Take 1 tablet (20 mEq total) by mouth daily.  . raloxifene (EVISTA) 60 MG tablet  Take 1 tablet (60 mg total) by mouth daily.  . Rivaroxaban (XARELTO) 15 MG TABS tablet Take 1 tablet (15 mg total) by mouth daily with supper.  . vitamin B-12 1000 MCG tablet Take 1 tablet (1,000 mcg total) by mouth daily.     Past Surgical History:  Procedure Laterality Date  . A-FLUTTER ABLATION N/A 02/08/2019   Procedure: A-FLUTTER ABLATION;  Surgeon: Thompson Grayer, MD;  Location: [place] [facility];  Service: Cardiovascular;  Laterality: N/A;  . ABDOMINAL HYSTERECTOMY    . APPENDECTOMY  1980  . BACK SURGERY    . CATARACT EXTRACTION, BILATERAL    . CHOLECYSTECTOMY  5/00  . COLONOSCOPY    . implantable loop recorder placement   04/06/2019   MDT Reveal LINQ [id number]) implanted for evaluation of palpitations and afib post atrial flutter ablation by Dr Rayann Heman in office  . INTRAMEDULLARY (IM) NAIL INTERTROCHANTERIC Left 09/23/2019   Procedure: INTRAMEDULLARY (IM) NAIL INTERTROCHANTRIC;  Surgeon: Shona Needles, MD;  Location: [place];  Service: Orthopedics;  Laterality: Left;  . TONSILLECTOMY    . TOTAL ABDOMINAL HYSTERECTOMY W/ BILATERAL SALPINGOOPHORECTOMY  1980  . UPPER GASTROINTESTINAL ENDOSCOPY      Family History  Problem Relation Age of Onset  . Diabetes Mother   . Stroke Mother   . Heart disease Father   . Stroke Father   . Uterine cancer Sister   . Diabetes Sister   . Ovarian cancer Sister   . Colon cancer Sister   . Diabetes Sister   . Liver cancer Sister        \  . Diabetes Brother   . Dementia Brother   . Atrial fibrillation Sister   . Diabetes Sister   . Diabetes Son   . Healthy Son   . Heart attack Neg Hx   . Hypertension Neg Hx   . Esophageal cancer Neg Hx   . Rectal cancer Neg Hx   . Stomach cancer Neg Hx     New complaints: None today  Social history: Was living in [place] after having hip repair. Has been home by herself for 1 1/2 weeks  Controlled substance contract: n/a    Review of Systems  Constitutional: Negative for diaphoresis.  Eyes: Negative for pain.  Respiratory: Negative for shortness of breath.   Cardiovascular: Negative for chest pain, palpitations and leg swelling.  Gastrointestinal: Negative for abdominal pain.  Endocrine: Negative for polydipsia.  Skin: Negative for rash.  Neurological: Negative for dizziness, weakness and headaches.  Hematological: Does not bruise/bleed easily.  All other systems reviewed and are negative.      Objective:   Physical Exam Vitals and nursing note reviewed.  Constitutional:      General: She is not in acute distress.    Appearance: Normal appearance. She is well-developed.  HENT:     Head:  Normocephalic.     Nose: Nose normal.  Eyes:     Pupils: Pupils are equal, round, and reactive to light.  Neck:     Vascular: No carotid bruit or JVD.  Cardiovascular:     Rate and Rhythm: Normal rate and regular rhythm.     Heart sounds: Normal heart sounds.  Pulmonary:     Effort: Pulmonary effort is normal. No respiratory distress.     Breath sounds: Normal breath sounds. No wheezing or rales.  Chest:     Chest wall: No tenderness.  Abdominal:     General: Bowel sounds are normal. There is  no distension or abdominal bruit.     Palpations: Abdomen is soft. There is no hepatomegaly, splenomegaly, mass or pulsatile mass.     Tenderness: There is no abdominal tenderness.  Musculoskeletal:        General: Normal range of motion.     Cervical back: Normal range of motion and neck supple.     Comments: Walking with walker- slow and steady gait  Lymphadenopathy:     Cervical: No cervical adenopathy.  Skin:    General: Skin is warm and dry.  Neurological:     Mental Status: She is alert and oriented to person, place, and time.     Deep Tendon Reflexes: Reflexes are normal and symmetric.  Psychiatric:        Behavior: Behavior normal.        Thought Content: Thought content normal.        Judgment: Judgment normal.     BP 139/88   Pulse 99   Temp 97.9 F (36.6 C) (Temporal)   Resp 20   Ht 5\' 6"  (1.676 m)   Wt 138 lb (62.6 kg)   SpO2 99%   BMI 22.27 kg/m   hgba1c 6.2     Assessment & Plan:  Katie [name] comes in today with chief complaint of No chief complaint on file.   Diagnosis and orders addressed:  1. Essential hypertension Low sodium diet - benazepril (LOTENSIN) 5 MG tablet; Take 1 tablet (5 mg total) by mouth daily.  Dispense: 90 tablet; Refill: 0 - cloNIDine (CATAPRES) 0.1 MG tablet; Take 1 tablet (0.1 mg total) by mouth at bedtime.  Dispense: 60 tablet; Refill: 0  2. Mixed hyperlipidemia Low fat diet - lovastatin (MEVACOR) 40 MG tablet; Take 1 tablet  (40 mg total) by mouth at bedtime.  Dispense: 90 tablet; Refill: 0  3. Diabetes mellitus type 2, diet-controlled ([place]) Continue  To watch carbs in diet - Bayer DCA Hb A1c Waived  4. Persistent atrial fibrillation (HCC) - Rivaroxaban (XARELTO) 15 MG TABS tablet; Take 1 tablet (15 mg total) by mouth daily with supper.  Dispense: 30 tablet; Refill: 11  5. Gastroesophageal reflux disease without esophagitis Avoid spicy foods Do not eat 2 hours prior to bedtime - pantoprazole (PROTONIX) 40 MG tablet; Take 1 tablet (40 mg total) by mouth daily.  Dispense: 90 tablet; Refill: 0 - famotidine (PEPCID) 20 MG tablet; Take 1 tablet (20 mg total) by mouth at bedtime.  Dispense: 90 tablet; Refill: 1  6. Acquired hypothyroidism Labs pending - levothyroxine (SYNTHROID) 50 MCG tablet; Take 1 tablet (50 mcg total) by mouth daily before breakfast.  Dispense: 90 tablet; Refill: 1  7. Hypokalemia - potassium chloride SA (KLOR-CON) 20 MEQ tablet; Take 1 tablet (20 mEq total) by mouth daily.  Dispense: 30 tablet; Refill: 1  8. Peripheral edema Elevate legs when sitting - furosemide (LASIX) 40 MG tablet; Take 1 tablet (40 mg total) by mouth daily.  Dispense: 30 tablet; Refill: 1  9. Primary insomnia Bedtime routine  10. Stage 3b chronic kidney disease Labs pending  11. Osteopenia, senile - raloxifene (EVISTA) 60 MG tablet; Take 1 tablet (60 mg total) by mouth daily.  Dispense: 90 tablet; Refill: 0   Labs pending Health Maintenance reviewed Diet and exercise encouraged  Follow up plan: 3 months   Mary-Margaret Hassell Done, FNP

## 2020-03-26 ENCOUNTER — Ambulatory Visit (INDEPENDENT_AMBULATORY_CARE_PROVIDER_SITE_OTHER): Payer: Medicare Other | Admitting: *Deleted

## 2020-03-26 DIAGNOSIS — I4819 Other persistent atrial fibrillation: Secondary | ICD-10-CM

## 2020-03-28 LAB — CUP PACEART REMOTE DEVICE CHECK
Date Time Interrogation Session: 20210809233216
Implantable Pulse Generator Implant Date: 20200819

## 2020-03-29 NOTE — Progress Notes (Signed)
Carelink Summary Report / Loop Recorder 

## 2020-04-20 ENCOUNTER — Other Ambulatory Visit: Payer: Self-pay | Admitting: Nurse Practitioner

## 2020-04-26 ENCOUNTER — Ambulatory Visit (INDEPENDENT_AMBULATORY_CARE_PROVIDER_SITE_OTHER): Payer: Medicare Other | Admitting: *Deleted

## 2020-04-26 DIAGNOSIS — I4819 Other persistent atrial fibrillation: Secondary | ICD-10-CM

## 2020-04-29 LAB — CUP PACEART REMOTE DEVICE CHECK
Date Time Interrogation Session: 20210911233620
Implantable Pulse Generator Implant Date: 20200819

## 2020-04-30 ENCOUNTER — Ambulatory Visit (INDEPENDENT_AMBULATORY_CARE_PROVIDER_SITE_OTHER): Payer: Medicare Other | Admitting: Nurse Practitioner

## 2020-04-30 ENCOUNTER — Other Ambulatory Visit: Payer: Self-pay

## 2020-04-30 ENCOUNTER — Encounter: Payer: Self-pay | Admitting: Nurse Practitioner

## 2020-04-30 VITALS — BP 150/81 | HR 81 | Temp 97.0°F | Resp 20 | Ht 66.0 in | Wt 138.0 lb

## 2020-04-30 DIAGNOSIS — K219 Gastro-esophageal reflux disease without esophagitis: Secondary | ICD-10-CM

## 2020-04-30 DIAGNOSIS — S72142A Displaced intertrochanteric fracture of left femur, initial encounter for closed fracture: Secondary | ICD-10-CM

## 2020-04-30 DIAGNOSIS — M545 Low back pain, unspecified: Secondary | ICD-10-CM

## 2020-04-30 DIAGNOSIS — S72142D Displaced intertrochanteric fracture of left femur, subsequent encounter for closed fracture with routine healing: Secondary | ICD-10-CM | POA: Diagnosis not present

## 2020-04-30 DIAGNOSIS — G8929 Other chronic pain: Secondary | ICD-10-CM

## 2020-04-30 MED ORDER — TRAMADOL HCL 50 MG PO TABS: 50.0000 mg | ORAL_TABLET | Freq: Three times a day (TID) | ORAL | 3 refills | Status: DC | PRN

## 2020-04-30 MED ORDER — GI COCKTAIL ~~LOC~~: 30.0000 mL | Freq: Every day | ORAL | 2 refills | Status: DC

## 2020-04-30 NOTE — Progress Notes (Signed)
Subjective:    Patient ID: Katie [name], female    DOB: July 24, 1932, 86 y.o.   MRN: [id number]   Chief Complaint: Pain Management (Also wants refill on GI Cocktail)    HPI:  1. Gastroesophageal reflux disease without esophagitis Patient is on protonix and uses GI cocktail. She uses the GI cocktail at bedtime.  2. Chronic midline low back pain without sciatica  3. Displaced intertrochanteric fracture of left femur, initial encounter for closed fracture (HCC) Pain assessment: Cause of pain- DDD and s/p femur fracture Pain location- lower back and left hip Pain on scale of 1-10- 2/10 today Frequency- daily What increases pain-to much standing What makes pain Better-rest Effects on ADL - none Any change in general medical condition-none  Current opioids rx- ultram 1x a day m ost days # meds rx- 30 Effectiveness of current meds-helps Adverse reactions from pain meds-none Morphine equivalent-   Pill count performed-No Last drug screen - none ( high risk q54m, moderate risk q42m, low risk yearly ) Urine drug screen today- No Was the [place] reviewed- yes  If yes were their any concerning findings? - no   Overdose risk: 25 Pain contract signed on:09/02/19     Outpatient Encounter Medications as of 04/30/2020  Medication Sig  . AMBULATORY NON FORMULARY MEDICATION Swish and swallow 30 mLs 4 (four) times daily as needed (mouth pain). GI cocktail 90 ml viscous lidocaine, 66ml-10mg /61ml dicyclomine, 253ml maalox Sig: Swish and swallow 40ml's by mouth four times daily  . benazepril (LOTENSIN) 5 MG tablet Take 1 tablet (5 mg total) by mouth daily.  . Cholecalciferol (VITAMIN D-3) 125 MCG (5000 UT) TABS Take 5,000 Units by mouth daily.  . cloNIDine (CATAPRES) 0.1 MG tablet Take 1 tablet (0.1 mg total) by mouth at bedtime.  . famotidine (PEPCID) 20 MG tablet Take 1 tablet (20 mg total) by mouth at bedtime.  . feeding supplement, ENSURE ENLIVE, (ENSURE ENLIVE) LIQD Take 237 mLs by  mouth 2 (two) times daily between meals.  . fluticasone (FLONASE) 50 MCG/ACT nasal spray Place 2 sprays into both nostrils daily.  . furosemide (LASIX) 40 MG tablet Take 1 tablet (40 mg total) by mouth daily.  [name] levothyroxine (SYNTHROID) 50 MCG tablet Take 1 tablet (50 mcg total) by mouth daily before breakfast.  . loratadine (CLARITIN) 10 MG tablet Take 10 mg by mouth daily as needed for allergies.  [name] lovastatin (MEVACOR) 40 MG tablet Take 1 tablet (40 mg total) by mouth at bedtime.  . Multiple Vitamin (MULTIVITAMIN WITH MINERALS) TABS tablet Take 1 tablet by mouth daily.  . pantoprazole (PROTONIX) 40 MG tablet Take 1 tablet (40 mg total) by mouth daily.  . potassium chloride SA (KLOR-CON) 20 MEQ tablet Take 1 tablet (20 mEq total) by mouth daily.  . raloxifene (EVISTA) 60 MG tablet Take 1 tablet (60 mg total) by mouth daily.  . Rivaroxaban (XARELTO) 15 MG TABS tablet Take 1 tablet (15 mg total) by mouth daily with supper.  . traMADol (ULTRAM) 50 MG tablet Take 50 mg by mouth every 8 (eight) hours as needed.  . vitamin B-12 1000 MCG tablet Take 1 tablet (1,000 mcg total) by mouth daily.   No facility-administered encounter medications on file as of 04/30/2020.    Past Surgical History:  Procedure Laterality Date  . A-FLUTTER ABLATION N/A 02/08/2019   Procedure: A-FLUTTER ABLATION;  Surgeon: Thompson Grayer, MD;  Location: [place] [facility];  Service: Cardiovascular;  Laterality: N/A;  . ABDOMINAL HYSTERECTOMY    .  APPENDECTOMY  1980  . BACK SURGERY    . CATARACT EXTRACTION, BILATERAL    . CHOLECYSTECTOMY  5/00  . COLONOSCOPY    . implantable loop recorder placement  04/06/2019   MDT Reveal LINQ [id number]) implanted for evaluation of palpitations and afib post atrial flutter ablation by Dr Rayann Heman in office  . INTRAMEDULLARY (IM) NAIL INTERTROCHANTERIC Left 09/23/2019   Procedure: INTRAMEDULLARY (IM) NAIL INTERTROCHANTRIC;  Surgeon: Shona Needles, MD;  Location: [place] [facility];  Service:  Orthopedics;  Laterality: Left;  . TONSILLECTOMY    . TOTAL ABDOMINAL HYSTERECTOMY W/ BILATERAL SALPINGOOPHORECTOMY  1980  . UPPER GASTROINTESTINAL ENDOSCOPY      Family History  Problem Relation Age of Onset  . Diabetes Mother   . Stroke Mother   . Heart disease Father   . Stroke Father   . Uterine cancer Sister   . Diabetes Sister   . Ovarian cancer Sister   . Colon cancer Sister   . Diabetes Sister   . Liver cancer Sister        \  . Diabetes Brother   . Dementia Brother   . Atrial fibrillation Sister   . Diabetes Sister   . Diabetes Son   . Healthy Son   . Heart attack Neg Hx   . Hypertension Neg Hx   . Esophageal cancer Neg Hx   . Rectal cancer Neg Hx   . Stomach cancer Neg Hx     New complaints: None today  Social history: Lives by herself  Controlled substance contract: n/a    Review of Systems  Constitutional: Negative for diaphoresis.  Eyes: Negative for pain.  Respiratory: Negative for shortness of breath.   Cardiovascular: Negative for chest pain, palpitations and leg swelling.  Gastrointestinal: Negative for abdominal pain.  Endocrine: Negative for polydipsia.  Musculoskeletal: Positive for arthralgias and back pain.  Skin: Negative for rash.  Neurological: Negative for dizziness, weakness and headaches.  Hematological: Does not bruise/bleed easily.  All other systems reviewed and are negative.      Objective:   Physical Exam Vitals and nursing note reviewed.  Constitutional:      General: She is not in acute distress.    Appearance: Normal appearance. She is well-developed.  Neck:     Vascular: No carotid bruit or JVD.  Cardiovascular:     Rate and Rhythm: Normal rate and regular rhythm.     Heart sounds: Normal heart sounds.  Pulmonary:     Effort: Pulmonary effort is normal. No respiratory distress.     Breath sounds: Normal breath sounds. No wheezing or rales.  Chest:     Chest wall: No tenderness.  Abdominal:     General:  Bowel sounds are normal. There is no distension or abdominal bruit.     Palpations: Abdomen is soft. There is no hepatomegaly, splenomegaly, mass or pulsatile mass.     Tenderness: There is no abdominal tenderness.  Musculoskeletal:        General: Normal range of motion.     Cervical back: Normal range of motion and neck supple.  Lymphadenopathy:     Cervical: No cervical adenopathy.  Skin:    General: Skin is warm and dry.  Neurological:     Mental Status: She is alert and oriented to person, place, and time.     Deep Tendon Reflexes: Reflexes are normal and symmetric.  Psychiatric:        Behavior: Behavior normal.  Thought Content: Thought content normal.        Judgment: Judgment normal.    BP (!) 150/81   Pulse 81   Temp (!) 97 F (36.1 C) (Temporal)   Resp 20   Ht 5\' 6"  (1.676 m)   Wt 138 lb (62.6 kg)   SpO2 98%   BMI 22.27 kg/m         Assessment & Plan:  Katie [name] in today with chief complaint of Pain Management (Also wants refill on GI Cocktail)   1. Gastroesophageal reflux disease without esophagitis Avoid spicy foods Do not eat 2 hours prior to bedtime - Alum & Mag Hydroxide-Simeth (GI COCKTAIL) SUSP suspension; Take 30 mLs by mouth at bedtime. Shake well.  Dispense: 900 mL; Refill: 2  2. Chronic midline low back pain without sciatica - traMADol (ULTRAM) 50 MG tablet; Take 1 tablet (50 mg total) by mouth every 8 (eight) hours as needed.  Dispense: 30 tablet; Refill: 3  3. Displaced intertrochanteric fracture of left femur, initial encounter for closed fracture [facility]) Fall precautions Sedation precaution - traMADol (ULTRAM) 50 MG tablet; Take 1 tablet (50 mg total) by mouth every 8 (eight) hours as needed.  Dispense: 30 tablet; Refill: 3    The above assessment and management plan was discussed with the patient. The patient verbalized understanding of and has agreed to the management plan. Patient is aware to call the clinic if symptoms persist  or worsen. Patient is aware when to return to the clinic for a follow-up visit. Patient educated on when it is appropriate to go to the emergency department.   Mary-Margaret Hassell Done, FNP

## 2020-04-30 NOTE — Patient Instructions (Signed)

## 2020-05-01 NOTE — Progress Notes (Signed)
Carelink Summary Report / Loop Recorder 

## 2020-05-10 ENCOUNTER — Telehealth: Payer: Self-pay | Admitting: Nurse Practitioner

## 2020-05-14 DIAGNOSIS — S7292XA Unspecified fracture of left femur, initial encounter for closed fracture: Secondary | ICD-10-CM | POA: Diagnosis not present

## 2020-05-17 ENCOUNTER — Ambulatory Visit: Payer: Medicare Other | Admitting: Nurse Practitioner

## 2020-05-17 IMAGING — RF DG ESOPHAGUS
11 of 12 series · 20 of 24 positions shown · non-contrast
Comparison: Previous chest radiographs.

CLINICAL DATA: Gastroesophageal reflux. Dysphagia.

EXAM:
ESOPHOGRAM/BARIUM SWALLOW
TECHNIQUE: Single contrast examination was performed using  thin barium.
FLUOROSCOPY TIME:  Fluoroscopy Time:  4 minutes 24 seconds
Radiation Exposure Index (if provided by the fluoroscopic device):
12.1 mGy
Number of Acquired Spot Images: 1

[Series 1: cp_standard · 0.54mm/px · 2 of 32 frames shown (1 of 11)]
[frame 5/32]
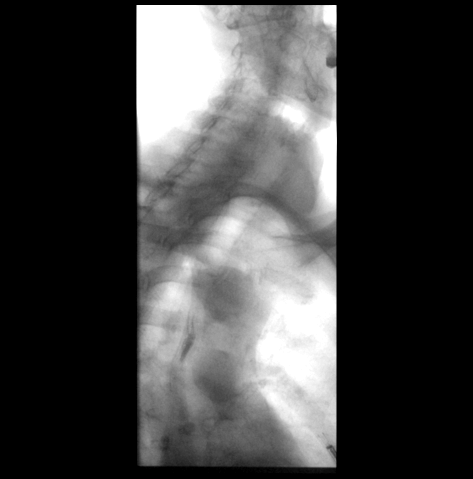
[frame 22/32]
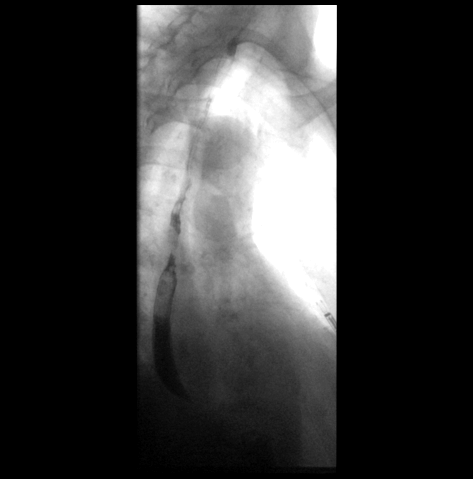

[Series 2: cp_standard · 0.55mm/px · 1 of 34 frames shown (2 of 11)]
[frame 20/34]
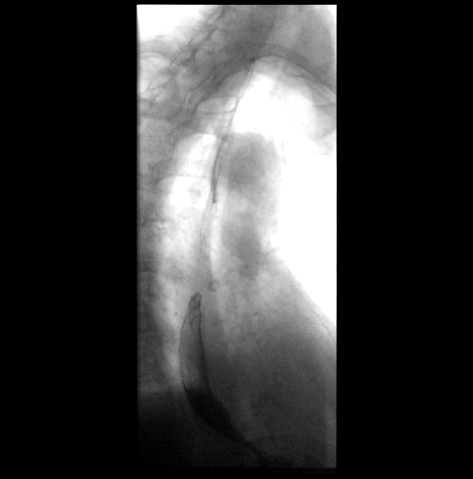

[Series 3: cp_standard · 0.55mm/px · 2 of 25 frames shown (3 of 11)]
[frame 4/25]
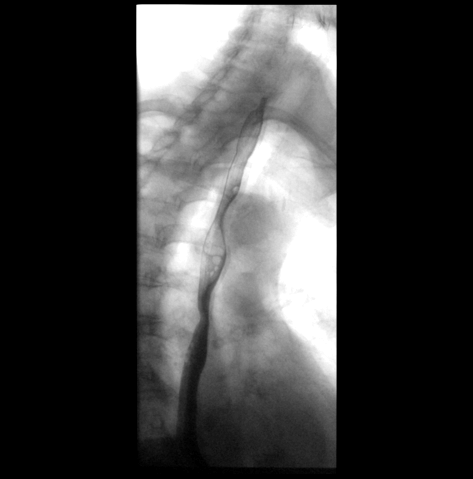
[frame 13/25]
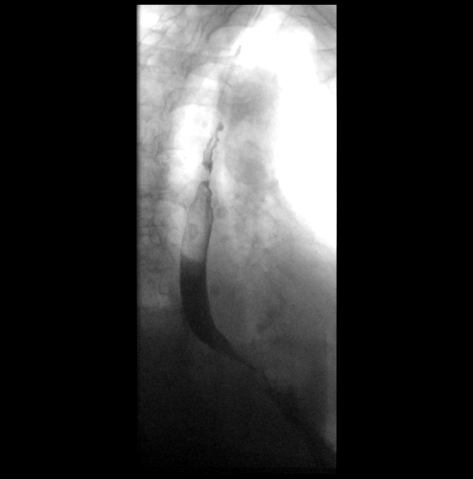

[Series 4: cp_standard · 0.55mm/px · 2 of 36 frames shown (4 of 11)]
[frame 6/36]
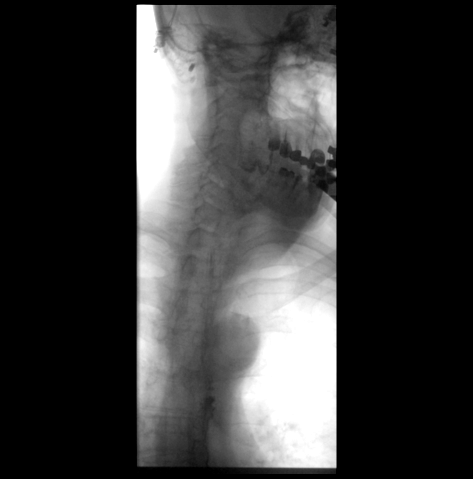
[frame 23/36]
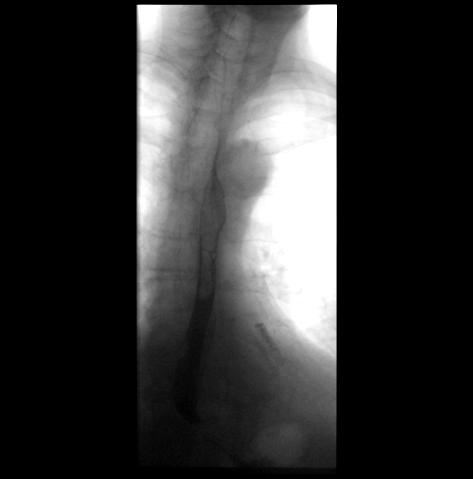

[Series 6: cp_standard · 0.55mm/px · 2 of 12 frames shown (5 of 11)]
[frame 1/12]
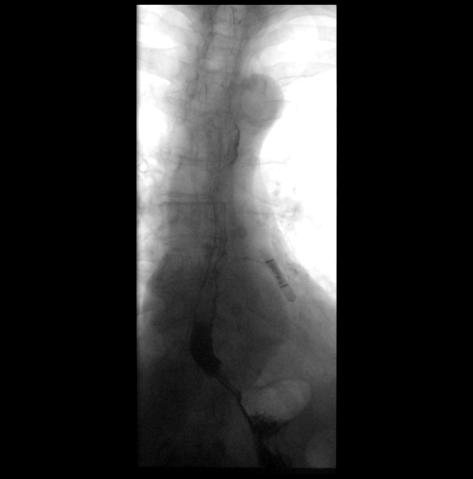
[frame 7/12]
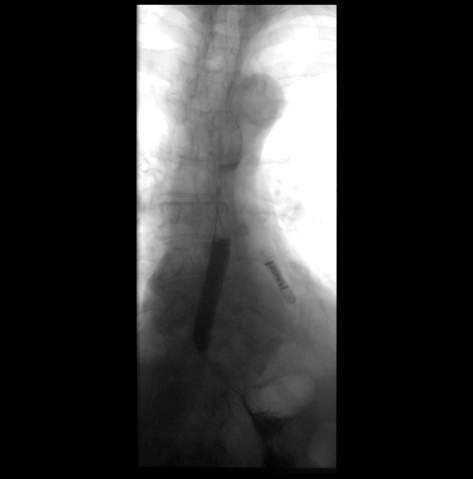

[Series 7: cp_standard · 0.55mm/px · 3 of 39 frames shown (6 of 11)]
[frame 5/39]
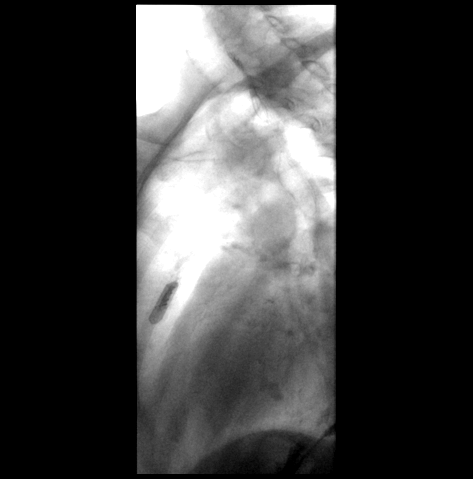
[frame 6/39]
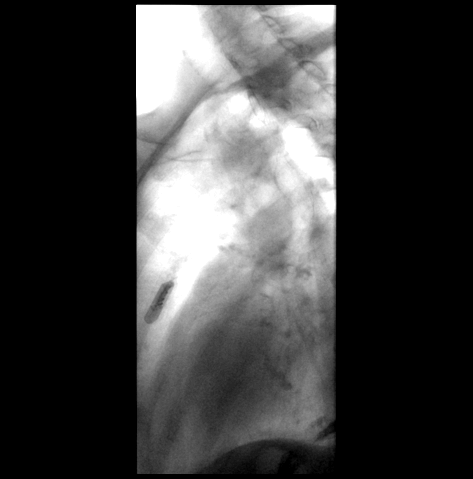
[frame 34/39]
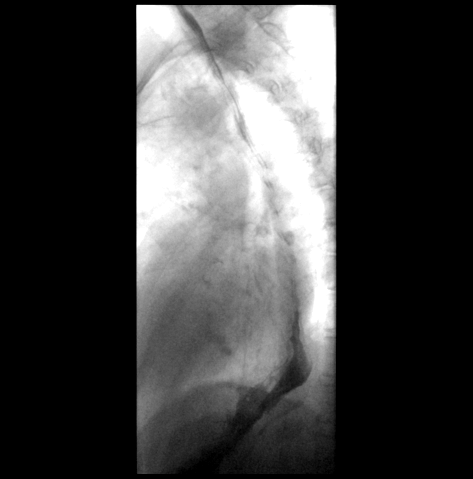

[Series 8: cp_standard · 0.55mm/px · 1 of 26 frames shown (7 of 11)]
[frame 23/26]
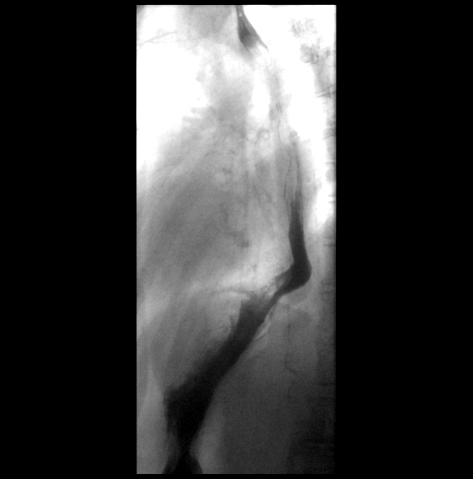

[Series 9: cp_standard · 0.55mm/px · 2 of 59 frames shown (8 of 11)]
[frame 29/59]
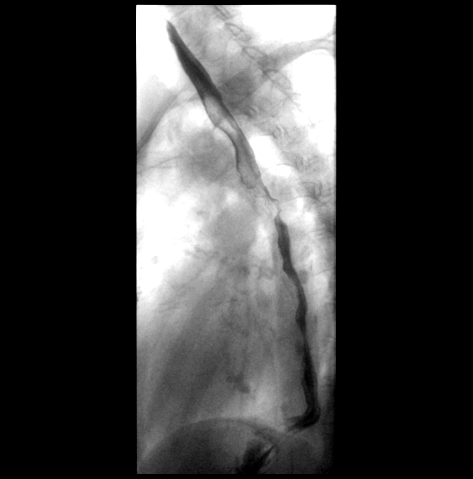
[frame 51/59]
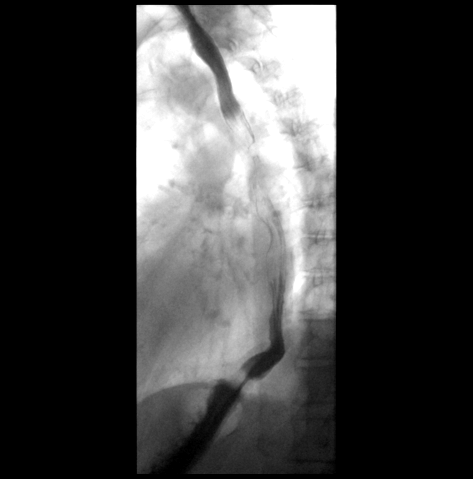

[Series 10: cp_standard · 0.55mm/px · 2 of 29 frames shown (9 of 11)]
[frame 12/29]
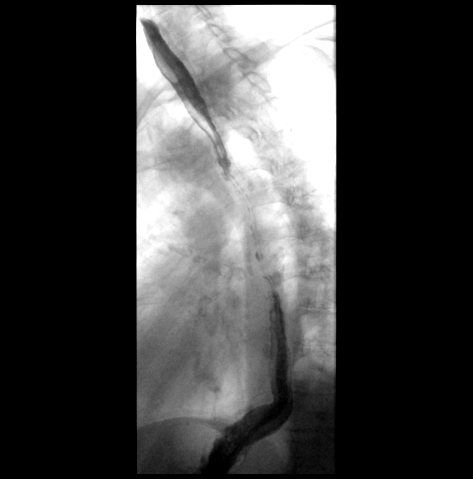
[frame 25/29]
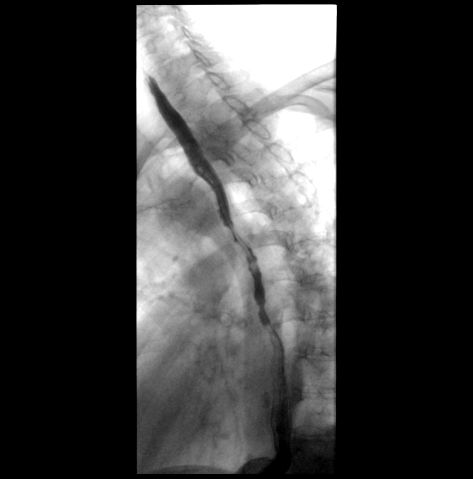

[Series 11: cp_standard · 0.54mm/px · 1 of 31 frames shown (10 of 11)]
[frame 27/31]
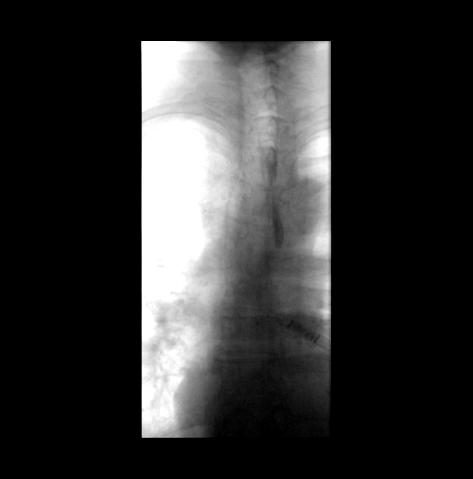

[Series 12: cp_standard · 0.54mm/px · 2 of 56 frames shown (11 of 11)]
[frame 29/56]
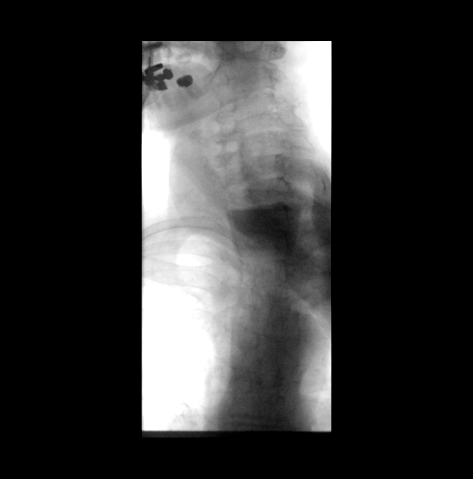
[frame 55/56  full-range]
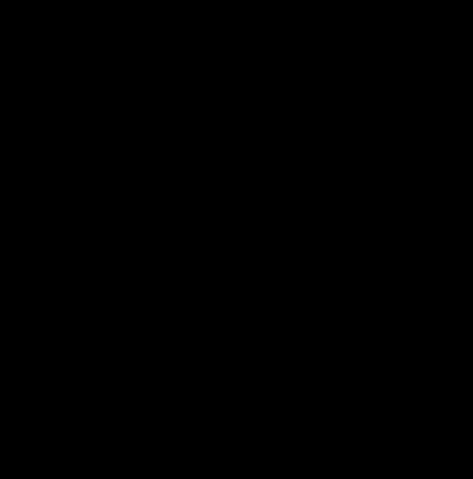

[20 of 24 positions shown; findings below may reference images not displayed]

FINDINGS: Swallowing even with standing showed tertiary peristalsis within the
esophagus.

No gross lesion or stricture on single contrast assessment.

No signs of swallowing difficulty.

In prone RAO position single swallow showed proximal escape of bolus
though the intact primary wave stripped much of the additional
bolus. Residual in the esophagus shows tertiary peristalsis
throughout the esophagus.

Full column assessment again with vigorous tertiary peristaltic
activity and stasis of ingested material in the esophagus. No signs
of stricture at the GE junction with small hiatal hernia.

The patient was then observed in supine position. Material move to
and fro within the esophagus, material that had remained in the
esophagus from the full column assessment.

The head of bed was then elevated to clear the esophagus.

Small sips of water elicited reflux to the level of the cervical
esophagus.

A barium tablet was in swallowed which passed easily through the
esophagus.

Incidental note is made of a loop recorder over the left cardiac
border.
IMPRESSION: Marked esophageal dysmotility with small hiatal hernia and reflux as
described. No signs of stricture or lesion in the esophagus on
evaluation with thin barium.

## 2020-05-18 ENCOUNTER — Other Ambulatory Visit: Payer: Self-pay

## 2020-05-18 DIAGNOSIS — K219 Gastro-esophageal reflux disease without esophagitis: Secondary | ICD-10-CM

## 2020-05-18 MED ORDER — FAMOTIDINE 20 MG PO TABS: 20.0000 mg | ORAL_TABLET | Freq: Every day | ORAL | 1 refills | Status: DC

## 2020-05-18 NOTE — Telephone Encounter (Signed)
  Prescription Request  05/18/2020  What is the name of the medication or equipment? famotidine (PEPCID) 20 MG tablet and cloNIDine (CATAPRES) 0.1 MG tablet  Have you contacted your pharmacy to request a refill? (if applicable) no  Which pharmacy would you like this sent to? [facility]   Patient notified that their request is being sent to the clinical staff for review and that they should receive a response within 2 business days.

## 2020-05-21 ENCOUNTER — Telehealth: Payer: Self-pay

## 2020-05-21 DIAGNOSIS — I10 Essential (primary) hypertension: Secondary | ICD-10-CM

## 2020-05-21 MED ORDER — CLONIDINE HCL 0.1 MG PO TABS: 0.1000 mg | ORAL_TABLET | Freq: Every day | ORAL | 5 refills | Status: DC

## 2020-05-21 NOTE — Telephone Encounter (Signed)
Pt aware refill sent to pharmacy 

## 2020-05-21 NOTE — Telephone Encounter (Signed)
  Prescription Request  05/21/2020  What is the name of the medication or equipment? Clonidine  Have you contacted your pharmacy to request a refill? (if applicable) Yes  Which pharmacy would you like this sent to? [facility]   Patient notified that their request is being sent to the clinical staff for review and that they should receive a response within 2 business days.   Pt had 2 meds that she needed, but they only sent in one.  She is out of this med after today.  MMM's pt.  Please call pt.

## 2020-05-24 DIAGNOSIS — C4442 Squamous cell carcinoma of skin of scalp and neck: Secondary | ICD-10-CM | POA: Diagnosis not present

## 2020-05-24 DIAGNOSIS — L570 Actinic keratosis: Secondary | ICD-10-CM | POA: Diagnosis not present

## 2020-05-24 DIAGNOSIS — D485 Neoplasm of uncertain behavior of skin: Secondary | ICD-10-CM | POA: Diagnosis not present

## 2020-06-01 ENCOUNTER — Ambulatory Visit (INDEPENDENT_AMBULATORY_CARE_PROVIDER_SITE_OTHER): Payer: Medicare Other

## 2020-06-01 DIAGNOSIS — I4819 Other persistent atrial fibrillation: Secondary | ICD-10-CM | POA: Diagnosis not present

## 2020-06-02 LAB — CUP PACEART REMOTE DEVICE CHECK
Date Time Interrogation Session: 20211014233959
Implantable Pulse Generator Implant Date: 20200819

## 2020-06-06 NOTE — Progress Notes (Signed)
Carelink Summary Report / Loop Recorder 

## 2020-06-21 ENCOUNTER — Ambulatory Visit: Payer: Self-pay | Admitting: Nurse Practitioner

## 2020-06-21 DIAGNOSIS — D044 Carcinoma in situ of skin of scalp and neck: Secondary | ICD-10-CM | POA: Diagnosis not present

## 2020-06-21 DIAGNOSIS — L570 Actinic keratosis: Secondary | ICD-10-CM | POA: Diagnosis not present

## 2020-06-21 DIAGNOSIS — D485 Neoplasm of uncertain behavior of skin: Secondary | ICD-10-CM | POA: Diagnosis not present

## 2020-06-21 DIAGNOSIS — C4442 Squamous cell carcinoma of skin of scalp and neck: Secondary | ICD-10-CM | POA: Diagnosis not present

## 2020-06-29 ENCOUNTER — Ambulatory Visit (INDEPENDENT_AMBULATORY_CARE_PROVIDER_SITE_OTHER): Payer: Medicare Other | Admitting: Nurse Practitioner

## 2020-06-29 ENCOUNTER — Encounter: Payer: Self-pay | Admitting: Nurse Practitioner

## 2020-06-29 ENCOUNTER — Other Ambulatory Visit: Payer: Self-pay

## 2020-06-29 VITALS — BP 157/78 | HR 88 | Temp 98.1°F | Resp 20 | Ht 66.0 in | Wt 140.0 lb

## 2020-06-29 DIAGNOSIS — R609 Edema, unspecified: Secondary | ICD-10-CM

## 2020-06-29 DIAGNOSIS — N1832 Chronic kidney disease, stage 3b: Secondary | ICD-10-CM

## 2020-06-29 DIAGNOSIS — I10 Essential (primary) hypertension: Secondary | ICD-10-CM | POA: Diagnosis not present

## 2020-06-29 DIAGNOSIS — M8580 Other specified disorders of bone density and structure, unspecified site: Secondary | ICD-10-CM

## 2020-06-29 DIAGNOSIS — Z23 Encounter for immunization: Secondary | ICD-10-CM | POA: Diagnosis not present

## 2020-06-29 DIAGNOSIS — I4819 Other persistent atrial fibrillation: Secondary | ICD-10-CM

## 2020-06-29 DIAGNOSIS — E785 Hyperlipidemia, unspecified: Secondary | ICD-10-CM | POA: Diagnosis not present

## 2020-06-29 DIAGNOSIS — M545 Low back pain, unspecified: Secondary | ICD-10-CM

## 2020-06-29 DIAGNOSIS — E119 Type 2 diabetes mellitus without complications: Secondary | ICD-10-CM | POA: Diagnosis not present

## 2020-06-29 DIAGNOSIS — E782 Mixed hyperlipidemia: Secondary | ICD-10-CM

## 2020-06-29 DIAGNOSIS — E039 Hypothyroidism, unspecified: Secondary | ICD-10-CM

## 2020-06-29 DIAGNOSIS — E876 Hypokalemia: Secondary | ICD-10-CM

## 2020-06-29 DIAGNOSIS — G8929 Other chronic pain: Secondary | ICD-10-CM

## 2020-06-29 DIAGNOSIS — F5101 Primary insomnia: Secondary | ICD-10-CM

## 2020-06-29 DIAGNOSIS — K219 Gastro-esophageal reflux disease without esophagitis: Secondary | ICD-10-CM | POA: Diagnosis not present

## 2020-06-29 LAB — BAYER DCA HB A1C WAIVED: HB A1C (BAYER DCA - WAIVED): 6.1 % (ref <7.0)

## 2020-06-29 MED ORDER — RALOXIFENE HCL 60 MG PO TABS: 60.0000 mg | ORAL_TABLET | Freq: Every day | ORAL | 0 refills | Status: DC

## 2020-06-29 MED ORDER — FAMOTIDINE 20 MG PO TABS: 20.0000 mg | ORAL_TABLET | Freq: Every day | ORAL | 1 refills | Status: DC

## 2020-06-29 MED ORDER — BENAZEPRIL HCL 10 MG PO TABS: 10.0000 mg | ORAL_TABLET | Freq: Every day | ORAL | 1 refills | Status: DC

## 2020-06-29 MED ORDER — CLONIDINE HCL 0.1 MG PO TABS: 0.1000 mg | ORAL_TABLET | Freq: Every day | ORAL | 5 refills | Status: DC

## 2020-06-29 MED ORDER — LOVASTATIN 40 MG PO TABS: 40.0000 mg | ORAL_TABLET | Freq: Every day | ORAL | 0 refills | Status: DC

## 2020-06-29 MED ORDER — RIVAROXABAN 15 MG PO TABS: 15.0000 mg | ORAL_TABLET | Freq: Every day | ORAL | 11 refills | Status: DC

## 2020-06-29 MED ORDER — PANTOPRAZOLE SODIUM 40 MG PO TBEC: 40.0000 mg | DELAYED_RELEASE_TABLET | Freq: Every day | ORAL | 0 refills | Status: DC

## 2020-06-29 MED ORDER — POTASSIUM CHLORIDE CRYS ER 20 MEQ PO TBCR: 20.0000 meq | EXTENDED_RELEASE_TABLET | Freq: Every day | ORAL | 1 refills | Status: DC

## 2020-06-29 MED ORDER — LEVOTHYROXINE SODIUM 50 MCG PO TABS: 50.0000 ug | ORAL_TABLET | Freq: Every day | ORAL | 1 refills | Status: DC

## 2020-06-29 MED ORDER — BENAZEPRIL HCL 5 MG PO TABS: 5.0000 mg | ORAL_TABLET | Freq: Every day | ORAL | 0 refills | Status: DC

## 2020-06-29 MED ORDER — TRAMADOL HCL 50 MG PO TABS: 50.0000 mg | ORAL_TABLET | Freq: Three times a day (TID) | ORAL | 3 refills | Status: DC | PRN

## 2020-06-29 MED ORDER — FUROSEMIDE 40 MG PO TABS: 40.0000 mg | ORAL_TABLET | Freq: Every day | ORAL | 1 refills | Status: DC

## 2020-06-29 NOTE — Progress Notes (Signed)
Subjective:    Patient ID: Katie [name], female    DOB: February 25, 1932, 86 y.o.   MRN: [id number]   Chief Complaint: medical management of chronic issues     HPI:  1. Primary hypertension Says BP has been running high recently. Her BP was low when she was in the hospital in February where they changed her meds. She does not check at home but at her doctors' appointments they have been running high. Denies chest pain, SOB, headaches. BP Readings from Last 3 Encounters:  04/30/20 (!) 150/81  03/20/20 139/88  09/28/19 (!) 140/53    2. Persistent atrial fibrillation ([place]) Denies racing heart. Has monitor in that allows cardiologist to track it. On eliquis with no bleeding issues  3. Diabetes mellitus type 2, diet-controlled ([place]) Does not check blood sugars at home. Does try to watch diet. Denies blurry vision, numbness/tingling in feet. Lab Results  Component Value Date   HGBA1C 6.2 03/20/2020    4. Acquired hypothyroidism No problems that she is aware of. Lab Results  Component Value Date   TSH 2.162 09/24/2019    5. Gastroesophageal reflux disease without esophagitis Does have problems with reflux. Her medications help keep it in control.  6. Osteopenia, senile Fractured hip in February and had surgical repair after a fall. Had a fall sometime after surgery and a screw broke in the surgical site but the doctor did not want to do surgical repair due to her age. Uses a walker to ambulate now.   7. Stage 3b chronic kidney disease (HCC) Urine output is good. Lab Results  Component Value Date   CREATININE 1.06 (H) 09/27/2019   BUN 13 09/27/2019   NA 140 09/27/2019   K 4.6 09/27/2019   CL 105 09/27/2019   CO2 28 09/27/2019    8. Primary insomnia Sleep is good. Reports no trouble falling or staying asleep.  9. Peripheral edema Does have some swelling in her legs but better than they used to be.  10. Hyperlipidemia, unspecified hyperlipidemia type Watches diet. Lab  Results  Component Value Date   CHOL 156 04/22/2019   HDL 55 04/22/2019   LDLCALC 77 04/22/2019   TRIG 136 04/22/2019   CHOLHDL 2.8 04/22/2019    11. Hypokalemia Denies cramping in legs. Lab Results  Component Value Date   K 4.6 09/27/2019      Outpatient Encounter Medications as of 06/29/2020  Medication Sig  . Alum & Mag Hydroxide-Simeth (GI COCKTAIL) SUSP suspension Take 30 mLs by mouth at bedtime. Shake well.  . AMBULATORY NON FORMULARY MEDICATION Swish and swallow 30 mLs 4 (four) times daily as needed (mouth pain). GI cocktail 90 ml viscous lidocaine, 24m-10mg/5ml dicyclomine, 2779mmaalox Sig: Swish and swallow 3010m by mouth four times daily  . benazepril (LOTENSIN) 5 MG tablet Take 1 tablet (5 mg total) by mouth daily.  . Cholecalciferol (VITAMIN D-3) 125 MCG (5000 UT) TABS Take 5,000 Units by mouth daily.  . cloNIDine (CATAPRES) 0.1 MG tablet Take 1 tablet (0.1 mg total) by mouth at bedtime.  . famotidine (PEPCID) 20 MG tablet Take 1 tablet (20 mg total) by mouth at bedtime.  . feeding supplement, ENSURE ENLIVE, (ENSURE ENLIVE) LIQD Take 237 mLs by mouth 2 (two) times daily between meals.  . fluticasone (FLONASE) 50 MCG/ACT nasal spray Place 2 sprays into both nostrils daily.  . furosemide (LASIX) 40 MG tablet Take 1 tablet (40 mg total) by mouth daily.  . lMarland Kitchenvothyroxine (SYNTHROID) 50 MCG tablet Take  1 tablet (50 mcg total) by mouth daily before breakfast.  . loratadine (CLARITIN) 10 MG tablet Take 10 mg by mouth daily as needed for allergies.  [name] lovastatin (MEVACOR) 40 MG tablet Take 1 tablet (40 mg total) by mouth at bedtime.  . Multiple Vitamin (MULTIVITAMIN WITH MINERALS) TABS tablet Take 1 tablet by mouth daily.  . pantoprazole (PROTONIX) 40 MG tablet Take 1 tablet (40 mg total) by mouth daily.  . potassium chloride SA (KLOR-CON) 20 MEQ tablet Take 1 tablet (20 mEq total) by mouth daily.  . raloxifene (EVISTA) 60 MG tablet Take 1 tablet (60 mg total) by mouth  daily.  . Rivaroxaban (XARELTO) 15 MG TABS tablet Take 1 tablet (15 mg total) by mouth daily with supper.  . traMADol (ULTRAM) 50 MG tablet Take 1 tablet (50 mg total) by mouth every 8 (eight) hours as needed.  . vitamin B-12 1000 MCG tablet Take 1 tablet (1,000 mcg total) by mouth daily.   No facility-administered encounter medications on file as of 06/29/2020.    Past Surgical History:  Procedure Laterality Date  . A-FLUTTER ABLATION N/A 02/08/2019   Procedure: A-FLUTTER ABLATION;  Surgeon: Thompson Grayer, MD;  Location: [facility];  Service: Cardiovascular;  Laterality: N/A;  . ABDOMINAL HYSTERECTOMY    . APPENDECTOMY  1980  . BACK SURGERY    . CATARACT EXTRACTION, BILATERAL    . CHOLECYSTECTOMY  5/00  . COLONOSCOPY    . implantable loop recorder placement  04/06/2019   MDT Reveal LINQ [id number]) implanted for evaluation of palpitations and afib post atrial flutter ablation by Dr Rayann Heman in office  . INTRAMEDULLARY (IM) NAIL INTERTROCHANTERIC Left 09/23/2019   Procedure: INTRAMEDULLARY (IM) NAIL INTERTROCHANTRIC;  Surgeon: Shona Needles, MD;  Location: [facility];  Service: Orthopedics;  Laterality: Left;  . TONSILLECTOMY    . TOTAL ABDOMINAL HYSTERECTOMY W/ BILATERAL SALPINGOOPHORECTOMY  1980  . UPPER GASTROINTESTINAL ENDOSCOPY      Family History  Problem Relation Age of Onset  . Diabetes Mother   . Stroke Mother   . Heart disease Father   . Stroke Father   . Uterine cancer Sister   . Diabetes Sister   . Ovarian cancer Sister   . Colon cancer Sister   . Diabetes Sister   . Liver cancer Sister        \  . Diabetes Brother   . Dementia Brother   . Atrial fibrillation Sister   . Diabetes Sister   . Diabetes Son   . Healthy Son   . Heart attack Neg Hx   . Hypertension Neg Hx   . Esophageal cancer Neg Hx   . Rectal cancer Neg Hx   . Stomach cancer Neg Hx     New complaints: None   Social history: Lives at home by herself. Was living with son until  July.  Controlled substance contract: N/A   Review of Systems  Constitutional: Negative for diaphoresis.  Eyes: Negative for pain.  Respiratory: Negative for shortness of breath.   Cardiovascular: Negative for chest pain, palpitations and leg swelling.  Gastrointestinal: Negative for abdominal pain.  Endocrine: Negative for polydipsia.  Skin: Negative for rash.  Neurological: Negative for dizziness, weakness and headaches.  Hematological: Does not bruise/bleed easily.  All other systems reviewed and are negative.      Objective:   Physical Exam Vitals and nursing note reviewed.  Constitutional:      General: She is not in acute distress.  Appearance: Normal appearance. She is well-developed.  HENT:     Head: Normocephalic.     Nose: Nose normal.  Eyes:     Pupils: Pupils are equal, round, and reactive to light.  Neck:     Vascular: No carotid bruit or JVD.  Cardiovascular:     Rate and Rhythm: Normal rate. Rhythm irregular.     Heart sounds: Normal heart sounds.  Pulmonary:     Effort: Pulmonary effort is normal. No respiratory distress.     Breath sounds: Normal breath sounds. No wheezing or rales.  Chest:     Chest wall: No tenderness.  Abdominal:     General: Bowel sounds are normal. There is no distension or abdominal bruit.     Palpations: Abdomen is soft. There is no hepatomegaly, splenomegaly, mass or pulsatile mass.     Tenderness: There is no abdominal tenderness.  Musculoskeletal:        General: Normal range of motion.     Cervical back: Normal range of motion and neck supple.     Comments: Gait slow and steady using walker  Lymphadenopathy:     Cervical: No cervical adenopathy.  Skin:    General: Skin is warm and dry.  Neurological:     Mental Status: She is alert and oriented to person, place, and time.     Deep Tendon Reflexes: Reflexes are normal and symmetric.  Psychiatric:        Behavior: Behavior normal.        Thought Content: Thought  content normal.        Judgment: Judgment normal.      BP (!) 157/78   Pulse 88   Temp 98.1 F (36.7 C) (Temporal)   Resp 20   Ht _0  (1.676 m)   Wt 140 lb (63.5 kg)   SpO2 97%   BMI 22.60 kg/m       Assessment & Plan:  CAMRYNN MCCLINTIC comes in today with chief complaint of Medical Management of Chronic Issues   Diagnosis and orders addressed:  1. Primary hypertension Low sodium diet - CBC with Differential/Platelet - CMP14+EGFR - benazepril (LOTENSIN) 10 MG tablet; Take 1 tablet (10 mg total) by mouth daily.  Dispense: 90 tablet; Refill: 1 - cloNIDine (CATAPRES) 0.1 MG tablet; Take 1 tablet (0.1 mg total) by mouth at bedtime.  Dispense: 60 tablet; Refill: 5  2. Persistent atrial fibrillation (HCC) Avoid caffeine Report heart racing - Rivaroxaban (XARELTO) 15 MG TABS tablet; Take 1 tablet (15 mg total) by mouth daily with supper.  Dispense: 30 tablet; Refill: 11  3. Diabetes mellitus type 2, diet-controlled ([place]) Continue to watch carbs in diet - Bayer DCA Hb A1c Waived - Microalbumin / creatinine urine ratio  4. Acquired hypothyroidism Labs pending - levothyroxine (SYNTHROID) 50 MCG tablet; Take 1 tablet (50 mcg total) by mouth daily before breakfast.  Dispense: 90 tablet; Refill: 1  5. Gastroesophageal reflux disease without esophagitis Avoid spicy foods Do not eat 2 hours prior to bedtime - famotidine (PEPCID) 20 MG tablet; Take 1 tablet (20 mg total) by mouth at bedtime.  Dispense: 90 tablet; Refill: 1 - pantoprazole (PROTONIX) 40 MG tablet; Take 1 tablet (40 mg total) by mouth daily.  Dispense: 90 tablet; Refill: 0  6. Osteopenia, senile Weight bearing exercise when can tolerate - raloxifene (EVISTA) 60 MG tablet; Take 1 tablet (60 mg total) by mouth daily.  Dispense: 90 tablet; Refill: 0  7. Stage 3b chronic kidney disease ([place])  Labs pending  8. Primary insomnia Bedtime routine  9. Peripheral edema Elevate legs when sitting - furosemide (LASIX) 40  MG tablet; Take 1 tablet (40 mg total) by mouth daily.  Dispense: 30 tablet; Refill: 1  10.  Mixed hyperlipidemiaLow fat diet - Lipid panel - lovastatin (MEVACOR) 40 MG tablet; Take 1 tablet (40 mg total) by mouth at bedtime.  Dispense: 90 tablet; Refill: 0  11. Hypokalemia - potassium chloride SA (KLOR-CON) 20 MEQ tablet; Take 1 tablet (20 mEq total) by mouth daily.  Dispense: 30 tablet; Refill: 1  12. Chronic midline low back pain without sciatica - traMADol (ULTRAM) 50 MG tablet; Take 1 tablet (50 mg total) by mouth every 8 (eight) hours as needed.  Dispense: 30 tablet; Refill: 3    Labs pending Health Maintenance reviewed Diet and exercise encouraged  Follow up plan: 3 months   Mary-Margaret Hassell Done, FNP

## 2020-06-29 NOTE — Patient Instructions (Signed)

## 2020-06-30 LAB — LIPID PANEL
Chol/HDL Ratio: 2.1 ratio (ref 0.0–4.4)
Cholesterol, Total: 152 mg/dL (ref 100–199)
HDL: 73 mg/dL (ref >39)
LDL Chol Calc (NIH): 63 mg/dL (ref 0–99)
Triglycerides: 87 mg/dL (ref 0–149)
VLDL Cholesterol Cal: 16 mg/dL (ref 5–40)

## 2020-06-30 LAB — CMP14+EGFR
ALT: 13 IU/L (ref 0–32)
AST: 17 IU/L (ref 0–40)
Albumin/Globulin Ratio: 1.8 (ref 1.2–2.2)
Albumin: 4.3 g/dL (ref 3.6–4.6)
Alkaline Phosphatase: 113 IU/L (ref 44–121)
BUN/Creatinine Ratio: 18 (ref 12–28)
BUN: 19 mg/dL (ref 8–27)
Bilirubin Total: 0.3 mg/dL (ref 0.0–1.2)
CO2: 26 mmol/L (ref 20–29)
Calcium: 9.4 mg/dL (ref 8.7–10.3)
Chloride: 101 mmol/L (ref 96–106)
Creatinine, Ser: 1.07 mg/dL — ABNORMAL HIGH (ref 0.57–1.00)
GFR calc Af Amer: 54 mL/min/{1.73_m2} — ABNORMAL LOW (ref >59)
GFR calc non Af Amer: 47 mL/min/{1.73_m2} — ABNORMAL LOW (ref >59)
Globulin, Total: 2.4 g/dL (ref 1.5–4.5)
Glucose: 117 mg/dL — ABNORMAL HIGH (ref 65–99)
Potassium: 4 mmol/L (ref 3.5–5.2)
Sodium: 141 mmol/L (ref 134–144)
Total Protein: 6.7 g/dL (ref 6.0–8.5)

## 2020-06-30 LAB — CBC WITH DIFFERENTIAL/PLATELET
Basophils Absolute: 0 10*3/uL (ref 0.0–0.2)
Basos: 0 %
EOS (ABSOLUTE): 0.2 10*3/uL (ref 0.0–0.4)
Eos: 3 %
Hematocrit: 35.6 % (ref 34.0–46.6)
Hemoglobin: 12 g/dL (ref 11.1–15.9)
Immature Grans (Abs): 0 10*3/uL (ref 0.0–0.1)
Immature Granulocytes: 0 %
Lymphocytes Absolute: 3.2 10*3/uL — ABNORMAL HIGH (ref 0.7–3.1)
Lymphs: 46 %
MCH: 32.5 pg (ref 26.6–33.0)
MCHC: 33.7 g/dL (ref 31.5–35.7)
MCV: 97 fL (ref 79–97)
Monocytes Absolute: 0.5 10*3/uL (ref 0.1–0.9)
Monocytes: 7 %
Neutrophils Absolute: 3 10*3/uL (ref 1.4–7.0)
Neutrophils: 44 %
Platelets: 225 10*3/uL (ref 150–450)
RBC: 3.69 x10E6/uL — ABNORMAL LOW (ref 3.77–5.28)
RDW: 12.2 % (ref 11.7–15.4)
WBC: 6.9 10*3/uL (ref 3.4–10.8)

## 2020-07-04 ENCOUNTER — Ambulatory Visit (INDEPENDENT_AMBULATORY_CARE_PROVIDER_SITE_OTHER): Payer: Medicare Other

## 2020-07-04 DIAGNOSIS — I4819 Other persistent atrial fibrillation: Secondary | ICD-10-CM

## 2020-07-04 LAB — CUP PACEART REMOTE DEVICE CHECK
Date Time Interrogation Session: 20211116225211
Implantable Pulse Generator Implant Date: 20200819

## 2020-07-06 NOTE — Progress Notes (Signed)
Carelink Summary Report / Loop Recorder 

## 2020-07-17 ENCOUNTER — Other Ambulatory Visit: Payer: Self-pay | Admitting: Nurse Practitioner

## 2020-07-17 DIAGNOSIS — J0100 Acute maxillary sinusitis, unspecified: Secondary | ICD-10-CM

## 2020-07-20 DIAGNOSIS — L570 Actinic keratosis: Secondary | ICD-10-CM | POA: Diagnosis not present

## 2020-07-20 DIAGNOSIS — D044 Carcinoma in situ of skin of scalp and neck: Secondary | ICD-10-CM | POA: Diagnosis not present

## 2020-08-03 DIAGNOSIS — S7292XA Unspecified fracture of left femur, initial encounter for closed fracture: Secondary | ICD-10-CM | POA: Diagnosis not present

## 2020-08-03 DIAGNOSIS — M810 Age-related osteoporosis without current pathological fracture: Secondary | ICD-10-CM | POA: Diagnosis not present

## 2020-08-06 ENCOUNTER — Ambulatory Visit (INDEPENDENT_AMBULATORY_CARE_PROVIDER_SITE_OTHER): Payer: Medicare Other

## 2020-08-06 DIAGNOSIS — I4819 Other persistent atrial fibrillation: Secondary | ICD-10-CM

## 2020-08-06 LAB — CUP PACEART REMOTE DEVICE CHECK
Date Time Interrogation Session: 20211219225126
Implantable Pulse Generator Implant Date: 20200819

## 2020-08-07 ENCOUNTER — Encounter: Payer: Self-pay | Admitting: Nurse Practitioner

## 2020-08-07 ENCOUNTER — Ambulatory Visit (INDEPENDENT_AMBULATORY_CARE_PROVIDER_SITE_OTHER): Payer: Medicare Other | Admitting: Nurse Practitioner

## 2020-08-07 ENCOUNTER — Other Ambulatory Visit: Payer: Self-pay

## 2020-08-07 VITALS — BP 178/78 | HR 73 | Temp 98.0°F | Resp 20 | Ht 66.0 in | Wt 141.0 lb

## 2020-08-07 DIAGNOSIS — H6123 Impacted cerumen, bilateral: Secondary | ICD-10-CM | POA: Diagnosis not present

## 2020-08-07 NOTE — Progress Notes (Signed)
   Subjective:    Patient ID: Katie [name], female    DOB: 02-04-1932, 86 y.o.   MRN: [id number]   Chief Complaint: Needs ears cleaned out   HPI Patient has been having trouble hearing with her hearing aids. ENT told her that she probably needs to have her ears cleaned out. So she came to have her ears checked.   Review of Systems  Constitutional: Negative for diaphoresis.  HENT: Positive for hearing loss.   Eyes: Negative for pain.  Respiratory: Negative for shortness of breath.   Cardiovascular: Negative for chest pain, palpitations and leg swelling.  Gastrointestinal: Negative for abdominal pain.  Endocrine: Negative for polydipsia.  Skin: Negative for rash.  Neurological: Negative for dizziness, weakness and headaches.  Hematological: Does not bruise/bleed easily.  All other systems reviewed and are negative.      Objective:   Physical Exam Vitals and nursing note reviewed.  HENT:     Right Ear: There is impacted cerumen.     Left Ear: There is impacted cerumen.  Cardiovascular:     Rate and Rhythm: Normal rate and regular rhythm.     Heart sounds: Normal heart sounds.  Pulmonary:     Breath sounds: Normal breath sounds.  Skin:    General: Skin is warm.  Neurological:     General: No focal deficit present.     Mental Status: She is oriented to person, place, and time.    BP (!) 178/78   Pulse 73   Temp 98 F (36.7 C) (Temporal)   Resp 20   Ht 5\' 6"  (1.676 m)   Wt 141 lb (64 kg)   SpO2 98%   BMI 22.76 kg/m    S/P bil ear curretting and  irrigation- TM's clear bil-Katie Hassell Done, FNP      Assessment & Plan:  Katie [name] in today with chief complaint of Needs ears cleaned out   1. Bilateral impacted cerumen Debrox OTC 2-3 x a week Do not use q tip in ears.  rto prn    The above assessment and management plan was discussed with the patient. The patient verbalized understanding of and has agreed to the management plan. Patient is aware to  call the clinic if symptoms persist or worsen. Patient is aware when to return to the clinic for a follow-up visit. Patient educated on when it is appropriate to go to the emergency department.   Katie Hassell Done, FNP

## 2020-08-07 NOTE — Patient Instructions (Signed)
Earwax Buildup, Adult The ears produce a substance called earwax that helps keep bacteria out of the ear and protects the skin in the ear canal. Occasionally, earwax can build up in the ear and cause discomfort or hearing loss. What increases the risk? This condition is more likely to develop in people who:  Are female.  Are elderly.  Naturally produce more earwax.  Clean their ears often with cotton swabs.  Use earplugs often.  Use in-ear headphones often.  Wear hearing aids.  Have narrow ear canals.  Have earwax that is overly thick or sticky.  Have eczema.  Are dehydrated.  Have excess hair in the ear canal. What are the signs or symptoms? Symptoms of this condition include:  Reduced or muffled hearing.  A feeling of fullness in the ear or feeling that the ear is plugged.  Fluid coming from the ear.  Ear pain.  Ear itch.  Ringing in the ear.  Coughing.  An obvious piece of earwax that can be seen inside the ear canal. How is this diagnosed? This condition may be diagnosed based on:  Your symptoms.  Your medical history.  An ear exam. During the exam, your health care provider will look into your ear with an instrument called an otoscope. You may have tests, including a hearing test. How is this treated? This condition may be treated by:  Using ear drops to soften the earwax.  Having the earwax removed by a health care provider. The health care provider may: ? Flush the ear with water. ? Use an instrument that has a loop on the end (curette). ? Use a suction device.  Surgery to remove the wax buildup. This may be done in severe cases. Follow these instructions at home:   Take over-the-counter and prescription medicines only as told by your health care provider.  Do not put any objects, including cotton swabs, into your ear. You can clean the opening of your ear canal with a washcloth or facial tissue.  Follow instructions from your health care  provider about cleaning your ears. Do not over-clean your ears.  Drink enough fluid to keep your urine clear or pale yellow. This will help to thin the earwax.  Keep all follow-up visits as told by your health care provider. If earwax builds up in your ears often or if you use hearing aids, consider seeing your health care provider for routine, preventive ear cleanings. Ask your health care provider how often you should schedule your cleanings.  If you have hearing aids, clean them according to instructions from the manufacturer and your health care provider. Contact a health care provider if:  You have ear pain.  You develop a fever.  You have blood, pus, or other fluid coming from your ear.  You have hearing loss.  You have ringing in your ears that does not go away.  Your symptoms do not improve with treatment.  You feel like the room is spinning (vertigo). Summary  Earwax can build up in the ear and cause discomfort or hearing loss.  The most common symptoms of this condition include reduced or muffled hearing and a feeling of fullness in the ear or feeling that the ear is plugged.  This condition may be diagnosed based on your symptoms, your medical history, and an ear exam.  This condition may be treated by using ear drops to soften the earwax or by having the earwax removed by a health care provider.  Do not put any   objects, including cotton swabs, into your ear. You can clean the opening of your ear canal with a washcloth or facial tissue. This information is not intended to replace advice given to you by your health care provider. Make sure you discuss any questions you have with your health care provider. Document Revised: 07/17/2017 Document Reviewed: 10/15/2016 Elsevier Patient Education  2020 [employer]  

## 2020-08-20 NOTE — Progress Notes (Signed)
Carelink Summary Report / Loop Recorder 

## 2020-08-22 ENCOUNTER — Telehealth: Payer: Self-pay

## 2020-08-22 NOTE — Telephone Encounter (Signed)
Please advise if we can start prolia here

## 2020-08-24 NOTE — Telephone Encounter (Signed)
That is fine- will get mandy to work on getting meds here

## 2020-08-24 NOTE — Telephone Encounter (Signed)
Mandy, I spoke with the patient and she has not taken any Prolia shots, even while in [place].  She wants to start them at our office.  I told her you would have to have this arranged and check her insurance to see what her cost would be and then you will call her with this information.

## 2020-08-27 ENCOUNTER — Ambulatory Visit (INDEPENDENT_AMBULATORY_CARE_PROVIDER_SITE_OTHER): Payer: Medicare Other | Admitting: *Deleted

## 2020-08-27 DIAGNOSIS — Z0000 Encounter for general adult medical examination without abnormal findings: Secondary | ICD-10-CM

## 2020-08-27 NOTE — Patient Instructions (Signed)
[place] Maintenance Summary and Written Plan of Care  Katie [name] ,  Thank you for allowing me to perform your Medicare Annual Wellness Visit and for your ongoing commitment to your health.   Health Maintenance & Immunization History Health Maintenance  Topic Date Due  . COVID-19 Vaccine (2 - Moderna 3-dose booster series) 05/30/2020  . OPHTHALMOLOGY EXAM  09/29/2020 (Originally 01/07/2016)  . MAMMOGRAM  03/20/2021 (Originally 10/26/2019)  . DEXA SCAN  03/20/2021 (Originally 09/23/2019)  . COLONOSCOPY (Pts 45-83yrs Insurance coverage will need to be confirmed)  03/20/2021 (Originally 05/28/2017)  . HEMOGLOBIN A1C  12/27/2020  . FOOT EXAM  03/20/2021  . TETANUS/TDAP  11/30/2021  . INFLUENZA VACCINE  Completed  . PNA vac Low Risk Adult  Completed   Immunization History  Administered Date(s) Administered  . Fluad Quad(high Dose 65+) 04/22/2019, 06/29/2020  . Influenza, High Dose Seasonal PF 04/27/2017, 06/01/2018  . Influenza,inj,Quad PF,6+ Mos 05/09/2013, 05/18/2014, 04/17/2016  . Influenza-Unspecified 04/28/2017  . Moderna Sars-Covid-2 Vaccination 04/17/2020, 05/02/2020  . Pneumococcal Conjugate-13 02/07/2015  . Pneumococcal Polysaccharide-23 06/30/2012  . Tdap 12/01/2011  . Zoster Recombinat (Shingrix) 04/28/2017, 07/16/2017    These are the patient goals that we discussed: Goals Addressed            This Visit's Progress   . AWV       08/27/2020 AWV Goal: Fall Prevention  . Over the next year, patient will decrease their risk for falls by: o Using assistive devices, such as a cane or walker, as needed o Identifying fall risks within their home and correcting them by: - Removing throw rugs - Adding handrails to stairs or ramps - Removing clutter and keeping a clear pathway throughout the home - Increasing light, especially at night - Adding shower handles/bars - Raising toilet seat o Identifying potential personal risk factors for  falls: - Medication side effects - Incontinence/urgency - Vestibular dysfunction - Hearing loss - Musculoskeletal disorders - Neurological disorders - Orthostatic hypotension  08/27/2020 AWV Goal: Diabetes Management  . Patient will maintain an A1C level below 8.0 . Patient will not develop any diabetic foot complications . Patient will not experience any hypoglycemic episodes over the next 3 months . Patient will notify our office of any CBG readings outside of the provider recommended range by calling [phone] . Patient will adhere to provider recommendations for diabetes management  Patient Self Management Activities . take all medications as prescribed and report any negative side effects . monitor and record blood sugar readings as directed . adhere to a low carbohydrate diet that incorporates lean proteins, vegetables, whole grains, low glycemic fruits . check feet daily noting any sores, cracks, injuries, or callous formations . see PCP or podiatrist if she notices any changes in her legs, feet, or toenails . Patient will visit PCP and have an A1C level checked every 3 to 6 months as directed  . have a yearly eye exam to monitor for vascular changes associated with diabetes and will request that the report be sent to her pcp.  . consult with her PCP regarding any changes in her health or new or worsening symptoms         This is a list of Health Maintenance Items that are overdue or due now: Health Maintenance Due  Topic Date Due  . COVID-19 Vaccine (2 - Moderna 3-dose booster series) 05/30/2020     Orders/Referrals Placed Today: No orders of the defined types were placed in this encounter.  (  Contact our referral department at [phone] if you have not spoken with someone about your referral appointment within the next 5 days)    Follow-up Plan . Follow-up with [name], FNP as planned . Keep your yearly eye exam

## 2020-08-27 NOTE — Progress Notes (Signed)
MEDICARE ANNUAL WELLNESS VISIT  08/27/2020  Telephone Visit Disclaimer This Medicare AWV was conducted by telephone due to national recommendations for restrictions regarding the COVID-19 Pandemic (e.g. social distancing).  I verified, using two identifiers, that I am speaking with Katie [name] or their authorized healthcare agent. I discussed the limitations, risks, security, and privacy concerns of performing an evaluation and management service by telephone and the potential availability of an in-person appointment in the future. The patient expressed understanding and agreed to proceed.  Location of Patient: Home Location of Provider (nurse):  [facility]  Subjective:    Katie [name] is a 87 y.o. female patient of Katie [name], Katie [name] who had a Medicare Annual Wellness Visit today via telephone. Katie [name] is Retired and lives alone. she has 2 children. she reports that she is socially active and does interact with friends/family regularly. she is not physically active and enjoys reading and watching tv.  Patient Care Team: Katie Pretty, FNP as PCP - General (Nurse Practitioner) [name], MD as PCP - Cardiology (Cardiology) [name], MD as Consulting Physician (Cardiology) Renard Hamper Marguerita Merles (Physician Assistant) Jessy Oto, MD as Consulting Physician (Orthopedic Surgery) Melina Schools, OD (Optometry)  Advanced Directives 08/27/2020 09/22/2019 05/09/2019 02/08/2019 01/28/2019 05/05/2018 04/06/2017  Does Patient Have a Medical Advance Directive? No No No No No No Yes  Type of Advance Directive - - - - - - Living will;Healthcare Power of Attorney  Does patient want to make changes to medical advance directive? - - - - - - Yes (MAU/Ambulatory/Procedural Areas - Information given)  Copy of [place] in Chart? - - - - - - No - copy requested  Would patient like information on creating a medical advance  directive? No - Patient declined - No - Patient declined No - Patient declined No - Patient declined No - Patient declined -    Hospital Utilization Over the Past 12 Months: # of hospitalizations or ER visits: 3 # of surgeries: 2  Review of Systems    Patient reports that her overall health is worse compared to last year. She is now unable to walk on her own without   History obtained from chart review and the patient  Patient Reported Readings (BP, Pulse, CBG, Weight, etc) none  Pain Assessment       Current Medications & Allergies (verified) Allergies as of 08/27/2020      Reactions   Amlodipine Swelling   Macrodantin Nausea And Vomiting   Metformin And Related Nausea And Vomiting, Other (See Comments)   Bloating   Penicillins Rash   Did it involve swelling of the face/tongue/throat, SOB, or low BP? Unknown Did it involve sudden or severe rash/hives, skin peeling, or any reaction on the inside of your mouth or nose? Yes Did you need to seek medical attention at a hospital or doctor's office? Yes When did it last happen? 2005 If all above answers are "NO", may proceed with cephalosporin use.      Medication List       Accurate as of August 27, 2020 11:50 AM. If you have any questions, ask your nurse or doctor.        STOP taking these medications   cyanocobalamin 1000 MCG tablet   loratadine 10 MG tablet Commonly known as: CLARITIN     TAKE these medications   benazepril 10 MG tablet Commonly known as: LOTENSIN Take 1 tablet (10 mg  total) by mouth daily.   cloNIDine 0.1 MG tablet Commonly known as: CATAPRES Take 1 tablet (0.1 mg total) by mouth at bedtime.   famotidine 20 MG tablet Commonly known as: PEPCID Take 1 tablet (20 mg total) by mouth at bedtime.   feeding supplement Liqd Take 237 mLs by mouth 2 (two) times daily between meals.   fluticasone 50 MCG/ACT nasal spray Commonly known as: FLONASE Use 2 spray(s) in each nostril once daily    furosemide 40 MG tablet Commonly known as: LASIX Take 1 tablet (40 mg total) by mouth daily.   gi cocktail Susp suspension Take 30 mLs by mouth at bedtime. Shake well.   levothyroxine 50 MCG tablet Commonly known as: SYNTHROID Take 1 tablet (50 mcg total) by mouth daily before breakfast.   lovastatin 40 MG tablet Commonly known as: MEVACOR Take 1 tablet (40 mg total) by mouth at bedtime.   multivitamin with minerals Tabs tablet Take 1 tablet by mouth daily.   pantoprazole 40 MG tablet Commonly known as: PROTONIX Take 1 tablet (40 mg total) by mouth daily.   potassium chloride SA 20 MEQ tablet Commonly known as: KLOR-CON Take 1 tablet (20 mEq total) by mouth daily.   raloxifene 60 MG tablet Commonly known as: EVISTA Take 1 tablet (60 mg total) by mouth daily.   Rivaroxaban 15 MG Tabs tablet Commonly known as: XARELTO Take 1 tablet (15 mg total) by mouth daily with supper.   traMADol 50 MG tablet Commonly known as: ULTRAM Take 1 tablet (50 mg total) by mouth every 8 (eight) hours as needed.   Vitamin D-3 125 MCG (5000 UT) Tabs Take 5,000 Units by mouth daily.       History (reviewed): Past Medical History:  Diagnosis Date  . Anxiety   . Aortic insufficiency    a. Trivial AI by echo 02/2016.  [name] Atrial fibrillation and flutter ([place])    a. Coarse afib vs flutter by EKG 12/2015.  [name] Cancer ([place])    skin cancer  . Cataract   . Chronic diastolic CHF (congestive heart failure) ([place])   . CKD (chronic kidney disease), stage III ([place])   . COVID-19   . GERD (gastroesophageal reflux disease)   . Hiatal hernia   . Hypercholesterolemia   . Hypertension   . Hypokalemia   . Hypothyroidism   . Left knee pain   . NIDDM (non-insulin dependent diabetes mellitus)    diet controlled   . Osteoporosis   . Premature atrial contractions   . PVC's (premature ventricular contractions)   . Vitamin D deficiency    Past Surgical History:  Procedure Laterality Date  . A-FLUTTER  ABLATION N/A 02/08/2019   Procedure: A-FLUTTER ABLATION;  Surgeon: Thompson Grayer, MD;  Location: [facility];  Service: Cardiovascular;  Laterality: N/A;  . ABDOMINAL HYSTERECTOMY    . APPENDECTOMY  1980  . BACK SURGERY    . CATARACT EXTRACTION, BILATERAL    . CHOLECYSTECTOMY  5/00  . COLONOSCOPY    . implantable loop recorder placement  04/06/2019   MDT Reveal LINQ [id number]) implanted for evaluation of palpitations and afib post atrial flutter ablation by Dr Rayann Heman in office  . INTRAMEDULLARY (IM) NAIL INTERTROCHANTERIC Left 09/23/2019   Procedure: INTRAMEDULLARY (IM) NAIL INTERTROCHANTRIC;  Surgeon: Shona Needles, MD;  Location: [facility];  Service: Orthopedics;  Laterality: Left;  . TONSILLECTOMY    . TOTAL ABDOMINAL HYSTERECTOMY W/ BILATERAL SALPINGOOPHORECTOMY  1980  . UPPER GASTROINTESTINAL ENDOSCOPY  Family History  Problem Relation Age of Onset  . Diabetes Mother   . Stroke Mother   . Heart disease Father   . Stroke Father   . Uterine cancer Sister   . Diabetes Sister   . Ovarian cancer Sister   . Colon cancer Sister   . Diabetes Sister   . Liver cancer Sister        \  . Diabetes Brother   . Dementia Brother   . Atrial fibrillation Sister   . Diabetes Sister   . Diabetes Son   . Healthy Son   . Heart attack Neg Hx   . Hypertension Neg Hx   . Esophageal cancer Neg Hx   . Rectal cancer Neg Hx   . Stomach cancer Neg Hx    Social History   Socioeconomic History  . Marital status: Widowed    Spouse name: Not on file  . Number of children: 2  . Years of education: Not on file  . Highest education level: High school graduate  Occupational History  . Occupation: Retired    Comment: Teacher, adult education , golf, farm   Tobacco Use  . Smoking status: Never Smoker  . Smokeless tobacco: Never Used  Vaping Use  . Vaping Use: Never used  Substance and Sexual Activity  . Alcohol use: No  . Drug use: No  . Sexual activity: Not Currently  Other  Topics Concern  . Not on file  Social History Narrative   Patient is widowed she has 2 children she used to work in [place] Strain: Not on file  Food Insecurity: Not on file  Transportation Needs: Not on file  Physical Activity: Not on file  Stress: Not on file  Social Connections: Not on file    Activities of Daily Living In your present state of health, do you have any difficulty performing the following activities: 08/27/2020 06/29/2020  Hearing? Y N  Comment Wears hearing aids and they do not help alot -  Vision? N N  Comment Wears glasses -  Difficulty concentrating or making decisions? N N  Walking or climbing stairs? Y Y  Comment Due to hip fracture last Feb -  Dressing or bathing? N N  Doing errands, shopping? N N  Preparing Food and eating ? N -  Using the Toilet? N -  In the past six months, have you accidently leaked urine? N -  Do you have problems with loss of bowel control? N -  Managing your Medications? N -  Managing your Finances? N -  Housekeeping or managing your Housekeeping? Y -  Comment Unable to complete like she used to due to hip fracture -  Some recent data might be hidden    Patient Education/ Literacy    Exercise Current Exercise Habits: The patient does not participate in regular exercise at present, Exercise limited by: orthopedic condition(s)  Diet Patient reports consuming 2 meals a day and 2 snack(s) a day Patient reports that her primary diet is: Regular Patient reports that she does have regular access to food.   Depression Screen PHQ 2/9 Scores 08/27/2020 08/07/2020 06/29/2020 04/30/2020 09/02/2019 05/09/2019 04/22/2019  PHQ - 2 Score 0 0 0 0 0 0 0  PHQ- 9 Score - - - - - - -     Fall Risk Fall Risk  08/27/2020 08/07/2020 06/29/2020 04/30/2020 09/02/2019  Falls in the past year? 1 0 0 0  0  Number falls in past yr: 0 - - - -  Injury with Fall? 1 - - - -  Risk for fall due to :  Impaired balance/gait - - - -  Risk for fall due to: Comment - - - - -  Follow up Falls evaluation completed - - - -     Objective:  Katie [name] seemed alert and oriented and she participated appropriately during our telephone visit.  Blood Pressure Weight BMI  BP Readings from Last 3 Encounters:  08/07/20 (!) 178/78  06/29/20 (!) 157/78  04/30/20 (!) 150/81   Wt Readings from Last 3 Encounters:  08/07/20 141 lb (64 kg)  06/29/20 140 lb (63.5 kg)  04/30/20 138 lb (62.6 kg)   BMI Readings from Last 1 Encounters:  08/07/20 22.76 kg/m    *Unable to obtain current vital signs, weight, and BMI due to telephone visit type  Hearing/Vision  . Lorilee did not seem to have difficulty with hearing/understanding during the telephone conversation . Reports that she has had a formal eye exam by an eye care professional within the past year. She has appointment on 08/28/2020 for her eye exam. . Reports that she has not had a formal hearing evaluation within the past year *Unable to fully assess hearing and vision during telephone visit type  Cognitive Function: 6CIT Screen 08/27/2020 05/09/2019  What Year? 0 points 0 points  What month? 0 points 0 points  What time? 0 points 0 points  Count back from 20 0 points 0 points  Months in reverse 0 points 0 points  Repeat phrase 0 points 0 points  Total Score 0 0   (Normal:0-7, Significant for Dysfunction: >8)  Normal Cognitive Function Screening: Yes   Immunization & Health Maintenance Record Immunization History  Administered Date(s) Administered  . Fluad Quad(high Dose 65+) 04/22/2019, 06/29/2020  . Influenza, High Dose Seasonal PF 04/27/2017, 06/01/2018  . Influenza,inj,Quad PF,6+ Mos 05/09/2013, 05/18/2014, 04/17/2016  . Influenza-Unspecified 04/28/2017  . Moderna Sars-Covid-2 Vaccination 04/17/2020, 05/02/2020  . Pneumococcal Conjugate-13 02/07/2015  . Pneumococcal Polysaccharide-23 06/30/2012  . Tdap 12/01/2011  . Zoster  Recombinat (Shingrix) 04/28/2017, 07/16/2017    Health Maintenance  Topic Date Due  . COVID-19 Vaccine (2 - Moderna 3-dose booster series) 05/30/2020  . OPHTHALMOLOGY EXAM  09/29/2020 (Originally 01/07/2016)  . MAMMOGRAM  03/20/2021 (Originally 10/26/2019)  . DEXA SCAN  03/20/2021 (Originally 09/23/2019)  . COLONOSCOPY (Pts 45-74yrs Insurance coverage will need to be confirmed)  03/20/2021 (Originally 05/28/2017)  . HEMOGLOBIN A1C  12/27/2020  . FOOT EXAM  03/20/2021  . TETANUS/TDAP  11/30/2021  . INFLUENZA VACCINE  Completed  . PNA vac Low Risk Adult  Completed       Assessment  This is a routine wellness examination for Katie [name].  Health Maintenance: Due or Overdue Health Maintenance Due  Topic Date Due  . COVID-19 Vaccine (2 - Moderna 3-dose booster series) 05/30/2020    Katie [name] does not need a referral for Community Assistance: Care Management:   no Social Work:    no Prescription Assistance:  no Nutrition/Diabetes Education:  no   Plan:  Personalized Goals Goals Addressed            This Visit's Progress   . AWV       08/27/2020 AWV Goal: Fall Prevention  . Over the next year, patient will decrease their risk for falls by: o Using assistive devices, such as a cane or walker, as needed o Identifying  fall risks within their home and correcting them by: - Removing throw rugs - Adding handrails to stairs or ramps - Removing clutter and keeping a clear pathway throughout the home - Increasing light, especially at night - Adding shower handles/bars - Raising toilet seat o Identifying potential personal risk factors for falls: - Medication side effects - Incontinence/urgency - Vestibular dysfunction - Hearing loss - Musculoskeletal disorders - Neurological disorders - Orthostatic hypotension  08/27/2020 AWV Goal: Diabetes Management  . Patient will maintain an A1C level below 8.0 . Patient will not develop any diabetic foot complications . Patient  will not experience any hypoglycemic episodes over the next 3 months . Patient will notify our office of any CBG readings outside of the provider recommended range by calling [phone] . Patient will adhere to provider recommendations for diabetes management  Patient Self Management Activities . take all medications as prescribed and report any negative side effects . monitor and record blood sugar readings as directed . adhere to a low carbohydrate diet that incorporates lean proteins, vegetables, whole grains, low glycemic fruits . check feet daily noting any sores, cracks, injuries, or callous formations . see PCP or podiatrist if she notices any changes in her legs, feet, or toenails . Patient will visit PCP and have an A1C level checked every 3 to 6 months as directed  . have a yearly eye exam to monitor for vascular changes associated with diabetes and will request that the report be sent to her pcp.  . consult with her PCP regarding any changes in her health or new or worsening symptoms       Personalized Health Maintenance & Screening Recommendations  Patient will be do for a COVID booster in March.   Lung Cancer Screening Recommended: no (Low Dose CT Chest recommended if Age 9-80 years, 30 pack-year currently smoking OR have quit w/in past 15 years) Hepatitis C Screening recommended: no HIV Screening recommended: no  Advanced Directives: Written information was not prepared per patient's request.  Referrals & Orders No orders of the defined types were placed in this encounter.   Follow-up Plan . Follow-up with Katie Pretty, FNP as planned . Keep your yearly eye exam . AVS printed and mailed to patient   I have personally reviewed and noted the following in the patient's chart:   . Medical and social history . Use of alcohol, tobacco or illicit drugs  . Current medications and supplements . Functional ability and status . Nutritional status . Physical  activity . Advanced directives . List of other physicians . Hospitalizations, surgeries, and ER visits in previous 12 months . Vitals . Screenings to include cognitive, depression, and falls . Referrals and appointments  In addition, I have reviewed and discussed with Katie [name] certain preventive protocols, quality metrics, and best practice recommendations. A written personalized care plan for preventive services as well as general preventive health recommendations is available and can be mailed to the patient at her request.      Lynnea Ferrier, LPN  [id number]

## 2020-08-31 DIAGNOSIS — C44529 Squamous cell carcinoma of skin of other part of trunk: Secondary | ICD-10-CM | POA: Diagnosis not present

## 2020-08-31 DIAGNOSIS — C44329 Squamous cell carcinoma of skin of other parts of face: Secondary | ICD-10-CM | POA: Diagnosis not present

## 2020-08-31 DIAGNOSIS — L905 Scar conditions and fibrosis of skin: Secondary | ICD-10-CM | POA: Diagnosis not present

## 2020-08-31 DIAGNOSIS — D485 Neoplasm of uncertain behavior of skin: Secondary | ICD-10-CM | POA: Diagnosis not present

## 2020-08-31 DIAGNOSIS — Z85828 Personal history of other malignant neoplasm of skin: Secondary | ICD-10-CM | POA: Diagnosis not present

## 2020-08-31 DIAGNOSIS — L570 Actinic keratosis: Secondary | ICD-10-CM | POA: Diagnosis not present

## 2020-09-08 LAB — CUP PACEART REMOTE DEVICE CHECK
Date Time Interrogation Session: 20220121230428
Implantable Pulse Generator Implant Date: 20200819

## 2020-09-10 ENCOUNTER — Ambulatory Visit (INDEPENDENT_AMBULATORY_CARE_PROVIDER_SITE_OTHER): Payer: Medicare Other

## 2020-09-10 DIAGNOSIS — I4819 Other persistent atrial fibrillation: Secondary | ICD-10-CM

## 2020-09-21 NOTE — Progress Notes (Signed)
Carelink Summary Report / Loop Recorder 

## 2020-09-28 DIAGNOSIS — C44329 Squamous cell carcinoma of skin of other parts of face: Secondary | ICD-10-CM | POA: Diagnosis not present

## 2020-10-02 ENCOUNTER — Other Ambulatory Visit: Payer: Self-pay

## 2020-10-02 ENCOUNTER — Ambulatory Visit (INDEPENDENT_AMBULATORY_CARE_PROVIDER_SITE_OTHER): Payer: Medicare Other | Admitting: Nurse Practitioner

## 2020-10-02 ENCOUNTER — Encounter: Payer: Self-pay | Admitting: Nurse Practitioner

## 2020-10-02 VITALS — BP 166/78 | HR 72 | Temp 98.0°F | Resp 20 | Ht 66.0 in | Wt 140.0 lb

## 2020-10-02 DIAGNOSIS — E876 Hypokalemia: Secondary | ICD-10-CM | POA: Diagnosis not present

## 2020-10-02 DIAGNOSIS — I10 Essential (primary) hypertension: Secondary | ICD-10-CM

## 2020-10-02 DIAGNOSIS — E782 Mixed hyperlipidemia: Secondary | ICD-10-CM

## 2020-10-02 DIAGNOSIS — M545 Low back pain, unspecified: Secondary | ICD-10-CM | POA: Diagnosis not present

## 2020-10-02 DIAGNOSIS — R600 Localized edema: Secondary | ICD-10-CM

## 2020-10-02 DIAGNOSIS — G8929 Other chronic pain: Secondary | ICD-10-CM

## 2020-10-02 DIAGNOSIS — I5032 Chronic diastolic (congestive) heart failure: Secondary | ICD-10-CM

## 2020-10-02 DIAGNOSIS — E039 Hypothyroidism, unspecified: Secondary | ICD-10-CM | POA: Diagnosis not present

## 2020-10-02 DIAGNOSIS — E119 Type 2 diabetes mellitus without complications: Secondary | ICD-10-CM

## 2020-10-02 DIAGNOSIS — K5909 Other constipation: Secondary | ICD-10-CM | POA: Diagnosis not present

## 2020-10-02 DIAGNOSIS — R609 Edema, unspecified: Secondary | ICD-10-CM

## 2020-10-02 DIAGNOSIS — N1832 Chronic kidney disease, stage 3b: Secondary | ICD-10-CM

## 2020-10-02 DIAGNOSIS — I4819 Other persistent atrial fibrillation: Secondary | ICD-10-CM

## 2020-10-02 DIAGNOSIS — M8580 Other specified disorders of bone density and structure, unspecified site: Secondary | ICD-10-CM

## 2020-10-02 DIAGNOSIS — K219 Gastro-esophageal reflux disease without esophagitis: Secondary | ICD-10-CM

## 2020-10-02 DIAGNOSIS — F5101 Primary insomnia: Secondary | ICD-10-CM

## 2020-10-02 DIAGNOSIS — Z6826 Body mass index (BMI) 26.0-26.9, adult: Secondary | ICD-10-CM

## 2020-10-02 LAB — BAYER DCA HB A1C WAIVED: HB A1C (BAYER DCA - WAIVED): 6.1 % (ref <7.0)

## 2020-10-02 MED ORDER — TRAMADOL HCL 50 MG PO TABS: 50.0000 mg | ORAL_TABLET | Freq: Three times a day (TID) | ORAL | 1 refills | Status: DC | PRN

## 2020-10-02 MED ORDER — CLONIDINE HCL 0.1 MG PO TABS
0.1000 mg | ORAL_TABLET | Freq: Every day | ORAL | 1 refills | Status: DC
Start: 2020-10-02 — End: 2021-04-02

## 2020-10-02 MED ORDER — RALOXIFENE HCL 60 MG PO TABS
60.0000 mg | ORAL_TABLET | Freq: Every day | ORAL | 0 refills | Status: DC
Start: 2020-10-02 — End: 2021-03-12

## 2020-10-02 MED ORDER — RIVAROXABAN 15 MG PO TABS
15.0000 mg | ORAL_TABLET | Freq: Every day | ORAL | 1 refills | Status: DC
Start: 2020-10-02 — End: 2021-04-02

## 2020-10-02 MED ORDER — POTASSIUM CHLORIDE CRYS ER 20 MEQ PO TBCR: 20.0000 meq | EXTENDED_RELEASE_TABLET | Freq: Every day | ORAL | 1 refills | Status: DC

## 2020-10-02 MED ORDER — PANTOPRAZOLE SODIUM 40 MG PO TBEC
40.0000 mg | DELAYED_RELEASE_TABLET | Freq: Every day | ORAL | 0 refills | Status: DC
Start: 2020-10-02 — End: 2021-02-20

## 2020-10-02 MED ORDER — GI COCKTAIL ~~LOC~~: 30.0000 mL | Freq: Every day | ORAL | 2 refills | Status: DC

## 2020-10-02 MED ORDER — FLUTICASONE PROPIONATE 50 MCG/ACT NA SUSP: 2.0000 | Freq: Every day | NASAL | 6 refills | Status: DC

## 2020-10-02 MED ORDER — BENAZEPRIL HCL 10 MG PO TABS: 10.0000 mg | ORAL_TABLET | Freq: Every day | ORAL | 1 refills | Status: DC

## 2020-10-02 MED ORDER — FAMOTIDINE 20 MG PO TABS
20.0000 mg | ORAL_TABLET | Freq: Every day | ORAL | 1 refills | Status: DC
Start: 2020-10-02 — End: 2021-04-02

## 2020-10-02 MED ORDER — LEVOTHYROXINE SODIUM 50 MCG PO TABS
50.0000 ug | ORAL_TABLET | Freq: Every day | ORAL | 1 refills | Status: DC
Start: 2020-10-02 — End: 2020-10-04

## 2020-10-02 MED ORDER — LOVASTATIN 40 MG PO TABS
40.0000 mg | ORAL_TABLET | Freq: Every day | ORAL | 1 refills | Status: DC
Start: 2020-10-02 — End: 2021-04-02

## 2020-10-02 MED ORDER — FUROSEMIDE 40 MG PO TABS: 40.0000 mg | ORAL_TABLET | Freq: Every day | ORAL | 1 refills | Status: DC

## 2020-10-02 NOTE — Patient Instructions (Signed)
Fall Prevention in the Home, Adult Falls can cause injuries and can happen to people of all ages. There are many things you can do to make your home safe and to help prevent falls. Ask for help when making these changes. What actions can I take to prevent falls? General Instructions  Use good lighting in all rooms. Replace any light bulbs that burn out.  Turn on the lights in dark areas. Use night-lights.  Keep items that you use often in easy-to-reach places. Lower the shelves around your home if needed.  Set up your furniture so you have a clear path. Avoid moving your furniture around.  Do not have throw rugs or other things on the floor that can make you trip.  Avoid walking on wet floors.  If any of your floors are uneven, fix them.  Add color or contrast paint or tape to clearly mark and help you see: ? Grab bars or handrails. ? First and last steps of staircases. ? Where the edge of each step is.  If you use a stepladder: ? Make sure that it is fully opened. Do not climb a closed stepladder. ? Make sure the sides of the stepladder are locked in place. ? Ask someone to hold the stepladder while you use it.  Know where your pets are when moving through your home. What can I do in the bathroom?  Keep the floor dry. Clean up any water on the floor right away.  Remove soap buildup in the tub or shower.  Use nonskid mats or decals on the floor of the tub or shower.  Attach bath mats securely with double-sided, nonslip rug tape.  If you need to sit down in the shower, use a plastic, nonslip stool.  Install grab bars by the toilet and in the tub and shower. Do not use towel bars as grab bars.      What can I do in the bedroom?  Make sure that you have a light by your bed that is easy to reach.  Do not use any sheets or blankets for your bed that hang to the floor.  Have a firm chair with side arms that you can use for support when you get dressed. What can I do in  the kitchen?  Clean up any spills right away.  If you need to reach something above you, use a step stool with a grab bar.  Keep electrical cords out of the way.  Do not use floor polish or wax that makes floors slippery. What can I do with my stairs?  Do not leave any items on the stairs.  Make sure that you have a light switch at the top and the bottom of the stairs.  Make sure that there are handrails on both sides of the stairs. Fix handrails that are broken or loose.  Install nonslip stair treads on all your stairs.  Avoid having throw rugs at the top or bottom of the stairs.  Choose a carpet that does not hide the edge of the steps on the stairs.  Check carpeting to make sure that it is firmly attached to the stairs. Fix carpet that is loose or worn. What can I do on the outside of my home?  Use bright outdoor lighting.  Fix the edges of walkways and driveways and fix any cracks.  Remove anything that might make you trip as you walk through a door, such as a raised step or threshold.  Trim any   bushes or trees on paths to your home.  Check to see if handrails are loose or broken and that both sides of all steps have handrails.  Install guardrails along the edges of any raised decks and porches.  Clear paths of anything that can make you trip, such as tools or rocks.  Have leaves, snow, or ice cleared regularly.  Use sand or salt on paths during winter.  Clean up any spills in your garage right away. This includes grease or oil spills. What other actions can I take?  Wear shoes that: ? Have a low heel. Do not wear high heels. ? Have rubber bottoms. ? Feel good on your feet and fit well. ? Are closed at the toe. Do not wear open-toe sandals.  Use tools that help you move around if needed. These include: ? Canes. ? Walkers. ? Scooters. ? Crutches.  Review your medicines with your doctor. Some medicines can make you feel dizzy. This can increase your chance  of falling. Ask your doctor what else you can do to help prevent falls. Where to find more information  Centers for Disease Control and Prevention, [employer]: www.cdc.gov  National Institute on Aging: www.nia.nih.gov Contact a doctor if:  You are afraid of falling at home.  You feel weak, drowsy, or dizzy at home.  You fall at home. Summary  There are many simple things that you can do to make your home safe and to help prevent falls.  Ways to make your home safe include removing things that can make you trip and installing grab bars in the bathroom.  Ask for help when making these changes in your home. This information is not intended to replace advice given to you by your health care provider. Make sure you discuss any questions you have with your health care provider. Document Revised: 03/07/2020 Document Reviewed: 03/07/2020 Elsevier Patient Education  2021 Elsevier Inc.  

## 2020-10-02 NOTE — Progress Notes (Signed)
Subjective:    Patient ID: Katie [name], female    DOB: Aug 27, 1931, 87 y.o.   MRN: [id number]   Chief Complaint: Medical Management of Chronic Issues    HPI:  1. Primary hypertension No c/o chest pain, sob or headache. Does not check blood pressure at home. BP Readings from Last 3 Encounters:  10/02/20 (!) 166/78  08/07/20 (!) 178/78  06/29/20 (!) 157/78     2. Diabetes mellitus type 2, diet-controlled (Katie [name]) She doe snot check her blood sugars at  Home. Does not really watch diet. Says she has been eating a lot of sweets lately. Lab Results  Component Value Date   HGBA1C 6.1 06/29/2020     3. Mixed hyperlipidemia Doe snot watch diet and does very little exercise. Lab Results  Component Value Date   CHOL 152 06/29/2020   HDL 73 06/29/2020   LDLCALC 63 06/29/2020   TRIG 87 06/29/2020   CHOLHDL 2.1 06/29/2020     4. Acquired hypothyroidism No problems that she is aware. Lab Results  Component Value Date   TSH 2.162 09/24/2019     5. Persistent atrial fibrillation (HCC) Denies palpitations or heart racing. She has a monitor at home that cardiology checks every week.  6. Chronic diastolic CHF (congestive heart failure) (HCC) Has mild ankle swelling daily.  7. Hypokalemia denies any lower ext cramping.  8. Stage 3b chronic kidney disease ([name]) No problems voiding.  Lab Results  Component Value Date   CREATININE 1.07 (H) 06/29/2020     9. Peripheral edema Mild ankle edema daily as previously stated  10. Primary insomnia On no meds for sleeps. Sleeps about 6-7 hours a night.  11. Other constipation Has improved. Says she has a bowel movement daily.  12. Chronic midline low back pain without sciatica Pain assessment: Cause of pain- arthritic Pain location- lower back Pain on scale of 1-10- 3-4/10 Frequency- daily What increases pain-to  Much house work What makes pain Better-rest Effects on ADL - none Any change in general medical  condition-none  Current opioids rx- ultram 50 # meds rx- 62 Effectiveness of current meds-helps with pain Adverse reactions from pain meds-none Morphine equivalent- 15MME  Pill count performed-No Last drug screen - none ( high risk q34m moderate risk q659mlow risk yearly ) Urine drug screen today- No Was the [place]- yes  If yes were their any concerning findings? - no   Overdose risk: 1   Pain contract signed on:10/02/20   13. BMI 26.0-26.9,adult No recent weight changes Wt Readings from Last 3 Encounters:  10/02/20 140 lb (63.5 kg)  08/07/20 141 lb (64 kg)  06/29/20 140 lb (63.5 kg)   BMI Readings from Last 3 Encounters:  10/02/20 22.60 kg/m  08/07/20 22.76 kg/m  06/29/20 22.60 kg/m       Outpatient Encounter Medications as of 10/02/2020  Medication Sig  . Alum & Mag Hydroxide-Simeth (GI COCKTAIL) SUSP suspension Take 30 mLs by mouth at bedtime. Shake well.  . benazepril (LOTENSIN) 10 MG tablet Take 1 tablet (10 mg total) by mouth daily.  . Cholecalciferol (VITAMIN D-3) 125 MCG (5000 UT) TABS Take 5,000 Units by mouth daily.  . cloNIDine (CATAPRES) 0.1 MG tablet Take 1 tablet (0.1 mg total) by mouth at bedtime.  . famotidine (PEPCID) 20 MG tablet Take 1 tablet (20 mg total) by mouth at bedtime.  . feeding supplement, ENSURE ENLIVE, (ENSURE ENLIVE) LIQD Take 237 mLs by mouth 2 (two) times daily between meals.  .[name]  fluticasone (FLONASE) 50 MCG/ACT nasal spray Use 2 spray(s) in each nostril once daily  . furosemide (LASIX) 40 MG tablet Take 1 tablet (40 mg total) by mouth daily.  [name] levothyroxine (SYNTHROID) 50 MCG tablet Take 1 tablet (50 mcg total) by mouth daily before breakfast.  . lovastatin (MEVACOR) 40 MG tablet Take 1 tablet (40 mg total) by mouth at bedtime.  . Multiple Vitamin (MULTIVITAMIN WITH MINERALS) TABS tablet Take 1 tablet by mouth daily.  . pantoprazole (PROTONIX) 40 MG tablet Take 1 tablet (40 mg total) by mouth daily.  . potassium chloride  SA (KLOR-CON) 20 MEQ tablet Take 1 tablet (20 mEq total) by mouth daily.  . raloxifene (EVISTA) 60 MG tablet Take 1 tablet (60 mg total) by mouth daily.  . Rivaroxaban (XARELTO) 15 MG TABS tablet Take 1 tablet (15 mg total) by mouth daily with supper.  . traMADol (ULTRAM) 50 MG tablet Take 1 tablet (50 mg total) by mouth every 8 (eight) hours as needed.   No facility-administered encounter medications on file as of 10/02/2020.    Past Surgical History:  Procedure Laterality Date  . A-FLUTTER ABLATION N/A 02/08/2019   Procedure: A-FLUTTER ABLATION;  Surgeon: Katie Grayer, Katie;  Location: [facility];  Service: Cardiovascular;  Laterality: N/A;  . ABDOMINAL HYSTERECTOMY    . APPENDECTOMY  1980  . BACK SURGERY    . CATARACT EXTRACTION, BILATERAL    . CHOLECYSTECTOMY  5/00  . COLONOSCOPY    . implantable loop recorder placement  04/06/2019   MDT Reveal LINQ [id number]) implanted for evaluation of palpitations and afib post atrial flutter ablation by Dr Katie [name] in office  . INTRAMEDULLARY (IM) NAIL INTERTROCHANTERIC Left 09/23/2019   Procedure: INTRAMEDULLARY (IM) NAIL INTERTROCHANTRIC;  Surgeon: Katie Needles, Katie;  Location: [facility];  Service: Orthopedics;  Laterality: Left;  . TONSILLECTOMY    . TOTAL ABDOMINAL HYSTERECTOMY W/ BILATERAL SALPINGOOPHORECTOMY  1980  . UPPER GASTROINTESTINAL ENDOSCOPY      Family History  Problem Relation Age of Onset  . Diabetes Mother   . Stroke Mother   . Heart disease Father   . Stroke Father   . Uterine cancer Sister   . Diabetes Sister   . Ovarian cancer Sister   . Colon cancer Sister   . Diabetes Sister   . Liver cancer Sister        \  . Diabetes Brother   . Dementia Brother   . Atrial fibrillation Sister   . Diabetes Sister   . Diabetes Son   . Healthy Son   . Heart attack Neg Hx   . Hypertension Neg Hx   . Esophageal cancer Neg Hx   . Rectal cancer Neg Hx   . Stomach cancer Neg Hx     New complaints: none  Social  history: Lives by herself- her sons call her daily      Review of Systems  Constitutional: Negative for diaphoresis.  Eyes: Negative for pain.  Respiratory: Negative for shortness of breath.   Cardiovascular: Negative for chest pain, palpitations and leg swelling.  Gastrointestinal: Negative for abdominal pain.  Endocrine: Negative for polydipsia.  Skin: Negative for rash.  Neurological: Negative for dizziness, weakness and headaches.  Hematological: Does not bruise/bleed easily.  All other systems reviewed and are negative.      Objective:   Physical Exam Vitals and nursing note reviewed.  Constitutional:      General: She is not in acute distress.  Appearance: Normal appearance. She is well-developed and well-nourished.  HENT:     Head: Normocephalic.     Nose: Nose normal.     Mouth/Throat:     Mouth: Oropharynx is clear and moist.  Eyes:     Extraocular Movements: EOM normal.     Pupils: Pupils are equal, round, and reactive to light.  Neck:     Vascular: No carotid bruit or JVD.  Cardiovascular:     Rate and Rhythm: Normal rate and regular rhythm.     Pulses: Intact distal pulses.     Heart sounds: Normal heart sounds.  Pulmonary:     Effort: Pulmonary effort is normal. No respiratory distress.     Breath sounds: Normal breath sounds. No wheezing or rales.  Chest:     Chest wall: No tenderness.  Abdominal:     General: Bowel sounds are normal. There is no distension or abdominal bruit. Aorta is normal.     Palpations: Abdomen is soft. There is no hepatomegaly, splenomegaly, mass or pulsatile mass.     Tenderness: There is no abdominal tenderness.  Musculoskeletal:        General: Normal range of motion.     Cervical back: Normal range of motion and neck supple.     Right lower leg: Edema (1+ ankle) present.     Left lower leg: Edema (1+ ankle) present.  Lymphadenopathy:     Cervical: No cervical adenopathy.  Skin:    General: Skin is warm and dry.   Neurological:     Mental Status: She is alert and oriented to person, place, and time.     Deep Tendon Reflexes: Reflexes are normal and symmetric.  Psychiatric:        Mood and Affect: Mood and affect normal.        Behavior: Behavior normal.        Thought Content: Thought content normal.        Judgment: Judgment normal.    BP (!) 166/78   Pulse 72   Temp 98 F (36.7 C) (Temporal)   Resp 20   Ht '5\' 6"'  (1.676 m)   Wt 140 lb (63.5 kg)   SpO2 98%   BMI 22.60 kg/m   hgba1c 6.1%       Assessment & Plan:  Katie [name] comes in today with chief complaint of Medical Management of Chronic Issues   Diagnosis and orders addressed:  1. Primary hypertension Low sodium diet - CBC with Differential/Platelet - CMP14+EGFR - benazepril (LOTENSIN) 10 MG tablet; Take 1 tablet (10 mg total) by mouth daily.  Dispense: 90 tablet; Refill: 1 - cloNIDine (CATAPRES) 0.1 MG tablet; Take 1 tablet (0.1 mg total) by mouth at bedtime.  Dispense: 180 tablet; Refill: 1  2. Diabetes mellitus type 2, diet-controlled ([place]) Continue to watch carbs in diet - Bayer DCA Hb A1c Waived - Microalbumin / creatinine urine ratio  3. Mixed hyperlipidemia Low fta idet - Lipid panel - lovastatin (MEVACOR) 40 MG tablet; Take 1 tablet (40 mg total) by mouth at bedtime.  Dispense: 90 tablet; Refill: 1  4. Acquired hypothyroidism - Thyroid Panel With TSH - levothyroxine (SYNTHROID) 50 MCG tablet; Take 1 tablet (50 mcg total) by mouth daily before breakfast.  Dispense: 90 tablet; Refill: 1  5. Persistent atrial fibrillation (HCC) - Rivaroxaban (XARELTO) 15 MG TABS tablet; Take 1 tablet (15 mg total) by mouth daily with supper.  Dispense: 90 tablet; Refill: 1  6. Chronic diastolic CHF (congestive  heart failure) ([place])  7. Hypokalemia - potassium chloride SA (KLOR-CON) 20 MEQ tablet; Take 1 tablet (20 mEq total) by mouth daily.  Dispense: 90 tablet; Refill: 1  8. Stage 3b chronic kidney disease ([place]) Labs  pending  9. Peripheral edema Elevate legs when sitting - furosemide (LASIX) 40 MG tablet; Take 1 tablet (40 mg total) by mouth daily.  Dispense: 90 tablet; Refill: 1  10. Primary insomnia Bedtime routine  11. Other constipation  12. Chronic midline low back pain without sciatica - traMADol (ULTRAM) 50 MG tablet; Take 1 tablet (50 mg total) by mouth every 8 (eight) hours as needed.  Dispense: 90 tablet; Refill: 1  13. BMI 26.0-26.9,adult Discussed diet and exercise for person with BMI >25 Will recheck weight in 3-6 months  14. Gastroesophageal reflux disease without esophagitis Avoid spicy foods Do not eat 2 hours prior to bedtime - pantoprazole (PROTONIX) 40 MG tablet; Take 1 tablet (40 mg total) by mouth daily.  Dispense: 90 tablet; Refill: 0 - famotidine (PEPCID) 20 MG tablet; Take 1 tablet (20 mg total) by mouth at bedtime.  Dispense: 90 tablet; Refill: 1 - Alum & Mag Hydroxide-Simeth (GI COCKTAIL) SUSP suspension; Take 30 mLs by mouth at bedtime. Shake well.  Dispense: 900 mL; Refill: 2  15. Osteopenia, senile Discussed prolia- opatinet wants to think about it before starting - raloxifene (EVISTA) 60 MG tablet; Take 1 tablet (60 mg total) by mouth daily.  Dispense: 90 tablet; Refill: 0   Labs pending Health Maintenance reviewed Diet and exercise encouraged  Follow up plan: 6 months   Mary-Margaret Hassell Done, FNP

## 2020-10-03 LAB — CBC WITH DIFFERENTIAL/PLATELET
Basophils Absolute: 0 10*3/uL (ref 0.0–0.2)
Basos: 0 %
EOS (ABSOLUTE): 0.2 10*3/uL (ref 0.0–0.4)
Eos: 3 %
Hematocrit: 36.9 % (ref 34.0–46.6)
Hemoglobin: 12.4 g/dL (ref 11.1–15.9)
Immature Grans (Abs): 0 10*3/uL (ref 0.0–0.1)
Immature Granulocytes: 0 %
Lymphocytes Absolute: 3.1 10*3/uL (ref 0.7–3.1)
Lymphs: 50 %
MCH: 32.4 pg (ref 26.6–33.0)
MCHC: 33.6 g/dL (ref 31.5–35.7)
MCV: 96 fL (ref 79–97)
Monocytes Absolute: 0.5 10*3/uL (ref 0.1–0.9)
Monocytes: 7 %
Neutrophils Absolute: 2.6 10*3/uL (ref 1.4–7.0)
Neutrophils: 40 %
Platelets: 222 10*3/uL (ref 150–450)
RBC: 3.83 x10E6/uL (ref 3.77–5.28)
RDW: 11.7 % (ref 11.7–15.4)
WBC: 6.4 10*3/uL (ref 3.4–10.8)

## 2020-10-03 LAB — CMP14+EGFR
ALT: 18 IU/L (ref 0–32)
AST: 24 IU/L (ref 0–40)
Albumin/Globulin Ratio: 1.7 (ref 1.2–2.2)
Albumin: 4.2 g/dL (ref 3.6–4.6)
Alkaline Phosphatase: 103 IU/L (ref 44–121)
BUN/Creatinine Ratio: 16 (ref 12–28)
BUN: 15 mg/dL (ref 8–27)
Bilirubin Total: 0.2 mg/dL (ref 0.0–1.2)
CO2: 23 mmol/L (ref 20–29)
Calcium: 9.2 mg/dL (ref 8.7–10.3)
Chloride: 100 mmol/L (ref 96–106)
Creatinine, Ser: 0.95 mg/dL (ref 0.57–1.00)
GFR calc Af Amer: 62 mL/min/{1.73_m2} (ref >59)
GFR calc non Af Amer: 54 mL/min/{1.73_m2} — ABNORMAL LOW (ref >59)
Globulin, Total: 2.5 g/dL (ref 1.5–4.5)
Glucose: 109 mg/dL — ABNORMAL HIGH (ref 65–99)
Potassium: 4.3 mmol/L (ref 3.5–5.2)
Sodium: 139 mmol/L (ref 134–144)
Total Protein: 6.7 g/dL (ref 6.0–8.5)

## 2020-10-03 LAB — LIPID PANEL
Chol/HDL Ratio: 2.2 ratio (ref 0.0–4.4)
Cholesterol, Total: 146 mg/dL (ref 100–199)
HDL: 66 mg/dL (ref >39)
LDL Chol Calc (NIH): 58 mg/dL (ref 0–99)
Triglycerides: 127 mg/dL (ref 0–149)
VLDL Cholesterol Cal: 22 mg/dL (ref 5–40)

## 2020-10-03 LAB — THYROID PANEL WITH TSH
Free Thyroxine Index: 1.8 (ref 1.2–4.9)
T3 Uptake Ratio: 24 % (ref 24–39)
T4, Total: 7.7 ug/dL (ref 4.5–12.0)
TSH: 5.48 u[IU]/mL — ABNORMAL HIGH (ref 0.450–4.500)

## 2020-10-03 LAB — MICROALBUMIN / CREATININE URINE RATIO
Creatinine, Urine: 34.4 mg/dL
Microalb/Creat Ratio: <9 mg/g creat (ref 0–29)
Microalbumin, Urine: <3 ug/mL

## 2020-10-04 ENCOUNTER — Telehealth: Payer: Self-pay

## 2020-10-04 DIAGNOSIS — K219 Gastro-esophageal reflux disease without esophagitis: Secondary | ICD-10-CM

## 2020-10-04 MED ORDER — GI COCKTAIL ~~LOC~~: 30.0000 mL | Freq: Every day | ORAL | 2 refills | Status: DC

## 2020-10-04 MED ORDER — LEVOTHYROXINE SODIUM 75 MCG PO TABS: 75.0000 ug | ORAL_TABLET | Freq: Every day | ORAL | 1 refills | Status: DC

## 2020-10-04 NOTE — Telephone Encounter (Signed)
Pt seen MMM on 10/02/2020--Please send Alum & Mag Hydroxide-Simeth (GI COCKTAIL) SUSP suspension to [facility]

## 2020-10-04 NOTE — Addendum Note (Signed)
Addended by: [name] on: 10/04/2020 03:28 PM   Modules accepted: Orders

## 2020-10-05 ENCOUNTER — Telehealth: Payer: Self-pay

## 2020-10-05 DIAGNOSIS — K219 Gastro-esophageal reflux disease without esophagitis: Secondary | ICD-10-CM

## 2020-10-05 MED ORDER — GI COCKTAIL ~~LOC~~: 30.0000 mL | Freq: Every day | ORAL | 2 refills | Status: DC

## 2020-10-05 NOTE — Telephone Encounter (Signed)
  Prescription Request  10/05/2020  What is the name of the medication or equipment? GI cocktail compound  Have you contacted your pharmacy to request a refill? (if applicable) yes  Which pharmacy would you like this sent to? [facility] in [place], [place]   Patient notified that their request is being sent to the clinical staff for review and that they should receive a response within 2 business days.   MMM's pt & she was seen the other day.  Please call pt  Pharmacy has not got RX yet.

## 2020-10-05 NOTE — Telephone Encounter (Signed)
Refill would not go electronically, printed and faxed to [facility]

## 2020-10-12 DIAGNOSIS — L570 Actinic keratosis: Secondary | ICD-10-CM | POA: Diagnosis not present

## 2020-10-12 DIAGNOSIS — D045 Carcinoma in situ of skin of trunk: Secondary | ICD-10-CM | POA: Diagnosis not present

## 2020-10-13 LAB — CUP PACEART REMOTE DEVICE CHECK
Date Time Interrogation Session: 20220223230555
Implantable Pulse Generator Implant Date: 20200819

## 2020-10-15 ENCOUNTER — Ambulatory Visit (INDEPENDENT_AMBULATORY_CARE_PROVIDER_SITE_OTHER): Payer: Medicare Other

## 2020-10-15 DIAGNOSIS — I4819 Other persistent atrial fibrillation: Secondary | ICD-10-CM

## 2020-10-23 NOTE — Progress Notes (Signed)
Carelink Summary Report / Loop Recorder 

## 2020-11-08 ENCOUNTER — Other Ambulatory Visit: Payer: Self-pay | Admitting: Nurse Practitioner

## 2020-11-08 DIAGNOSIS — K219 Gastro-esophageal reflux disease without esophagitis: Secondary | ICD-10-CM

## 2020-11-13 ENCOUNTER — Ambulatory Visit (INDEPENDENT_AMBULATORY_CARE_PROVIDER_SITE_OTHER): Payer: Medicare Other

## 2020-11-13 DIAGNOSIS — I4819 Other persistent atrial fibrillation: Secondary | ICD-10-CM

## 2020-11-13 LAB — CUP PACEART REMOTE DEVICE CHECK
Date Time Interrogation Session: 20220329001806
Implantable Pulse Generator Implant Date: 20200819

## 2020-11-27 NOTE — Progress Notes (Signed)
Carelink Summary Report / Loop Recorder 

## 2020-12-17 ENCOUNTER — Ambulatory Visit (INDEPENDENT_AMBULATORY_CARE_PROVIDER_SITE_OTHER): Payer: Medicare Other

## 2020-12-17 DIAGNOSIS — I4819 Other persistent atrial fibrillation: Secondary | ICD-10-CM | POA: Diagnosis not present

## 2020-12-18 LAB — CUP PACEART REMOTE DEVICE CHECK
Date Time Interrogation Session: 20220501001947
Implantable Pulse Generator Implant Date: 20200819

## 2021-01-07 NOTE — Progress Notes (Signed)
Carelink Summary Report / Loop Recorder 

## 2021-01-18 LAB — CUP PACEART REMOTE DEVICE CHECK
Date Time Interrogation Session: 20220603002409
Implantable Pulse Generator Implant Date: 20200819

## 2021-01-21 ENCOUNTER — Other Ambulatory Visit: Payer: Self-pay | Admitting: Nurse Practitioner

## 2021-01-21 ENCOUNTER — Ambulatory Visit (INDEPENDENT_AMBULATORY_CARE_PROVIDER_SITE_OTHER): Payer: Medicare Other

## 2021-01-21 DIAGNOSIS — I4819 Other persistent atrial fibrillation: Secondary | ICD-10-CM | POA: Diagnosis not present

## 2021-01-21 DIAGNOSIS — I10 Essential (primary) hypertension: Secondary | ICD-10-CM

## 2021-01-30 DIAGNOSIS — Z23 Encounter for immunization: Secondary | ICD-10-CM | POA: Diagnosis not present

## 2021-02-08 ENCOUNTER — Ambulatory Visit: Payer: Medicare Other | Admitting: Specialist

## 2021-02-12 NOTE — Progress Notes (Signed)
Carelink Summary Report / Loop Recorder 

## 2021-02-20 ENCOUNTER — Other Ambulatory Visit: Payer: Self-pay | Admitting: Nurse Practitioner

## 2021-02-20 DIAGNOSIS — K219 Gastro-esophageal reflux disease without esophagitis: Secondary | ICD-10-CM

## 2021-02-24 LAB — CUP PACEART REMOTE DEVICE CHECK
Date Time Interrogation Session: 20220706004305
Implantable Pulse Generator Implant Date: 20200819

## 2021-02-25 ENCOUNTER — Ambulatory Visit (INDEPENDENT_AMBULATORY_CARE_PROVIDER_SITE_OTHER): Payer: Medicare Other

## 2021-02-25 DIAGNOSIS — I495 Sick sinus syndrome: Secondary | ICD-10-CM

## 2021-02-28 ENCOUNTER — Encounter: Payer: Self-pay | Admitting: Specialist

## 2021-02-28 ENCOUNTER — Ambulatory Visit: Payer: Self-pay

## 2021-02-28 ENCOUNTER — Ambulatory Visit: Payer: Medicare Other | Admitting: Specialist

## 2021-02-28 VITALS — BP 175/93 | HR 81 | Ht 66.0 in | Wt 140.0 lb

## 2021-02-28 DIAGNOSIS — M25552 Pain in left hip: Secondary | ICD-10-CM

## 2021-02-28 DIAGNOSIS — M2170 Unequal limb length (acquired), unspecified site: Secondary | ICD-10-CM | POA: Diagnosis not present

## 2021-02-28 DIAGNOSIS — S72142G Displaced intertrochanteric fracture of left femur, subsequent encounter for closed fracture with delayed healing: Secondary | ICD-10-CM | POA: Diagnosis not present

## 2021-02-28 DIAGNOSIS — M25559 Pain in unspecified hip: Secondary | ICD-10-CM

## 2021-02-28 NOTE — Progress Notes (Signed)
Office Visit Note   Patient: Katie [name]           Date of Birth: 02/11/1932           MRN: [id number] Visit Date: 02/28/2021              Requested by: [name], [address] [place],  [place] [id number] PCP: [name], FNP   Assessment & Plan: Visit Diagnoses:  1. Pain in left hip   2. Acquired leg length discrepancy   3. Hip pain   4. Closed intertrochanteric fracture of left hip, with delayed healing, subsequent encounter     Plan: You have left hip pain and limping due to shortening of the left leg due to a left hip intertrochanteric Hip fracture. I appears healed but CT scan is the best way to assess that healing is complete. I will order the CT scan and recommend that you work on left hip abduction exercises to strengthen the muscles about the right hip.  Recommend a shoe lift for the left leg as it is nearly 3/4 inch shortened compared with the right hip length.  Hip Exercises Ask your health care provider which exercises are safe for you. Do exercises exactly as told by your health care provider and adjust them as directed. It is normal to feel mild stretching, pulling, tightness, or discomfort as you do these exercises. Stop right away if you feel sudden pain or your pain gets worse. Do not begin these exercises until told by your health care provider. Stretching and range-of-motion exercises These exercises warm up your muscles and joints and improve the movement and flexibility of your hip. These exercises also help to relieve pain, numbness, and tingling. You may be asked to limit your range of motion if you had a hipreplacement. Talk to your health care provider about these restrictions. Hamstrings, supine  Lie on your back (supine position). Loop a belt or towel over the ball of your left / right foot. The ball of your foot is on the walking surface, right under your toes. Straighten your left / right knee and slowly pull on the belt  or towel to raise your leg until you feel a gentle stretch behind your knee (hamstring). Do not let your knee bend while you do this. Keep your other leg flat on the floor. Hold this position for __________ seconds. Slowly return your leg to the starting position. Repeat __________ times. Complete this exercise __________ times a day. Hip rotation  Lie on your back on a firm surface. With your left / right hand, gently pull your left / right knee toward the shoulder that is on the same side of the body. Stop when your knee is pointing toward the ceiling. Hold your left / right ankle with your other hand. Keeping your knee steady, gently pull your left / right ankle toward your other shoulder until you feel a stretch in your buttocks. Keep your hips and shoulders firmly planted while you do this stretch. Hold this position for __________ seconds. Repeat __________ times. Complete this exercise __________ times a day. Seated stretch This exercise is sometimes called hamstrings and adductors stretch. Sit on the floor with your legs stretched wide. Keep your knees straight during this exercise. Keeping your head and back in a straight line, bend at your waist to reach for your left foot (position A). You should feel a stretch in your right inner thigh (adductors). Hold this position for __________  seconds. Then slowly return to the upright position. Keeping your head and back in a straight line, bend at your waist to reach forward (position B). You should feel a stretch behind both of your thighs and knees (hamstrings). Hold this position for __________ seconds. Then slowly return to the upright position. Keeping your head and back in a straight line, bend at your waist to reach for your right foot (position C). You should feel a stretch in your left inner thigh (adductors). Hold this position for __________ seconds. Then slowly return to the upright position. Repeat __________ times. Complete  this exercise __________ times a day. Lunge This exercise stretches the muscles of the hip (hip flexors). Place your left / right knee on the floor and bend your other knee so that is directly over your ankle. You should be half-kneeling. Keep good posture with your head over your shoulders. Tighten your buttocks to point your tailbone downward. This will prevent your back from arching too much. You should feel a gentle stretch in the front of your left / right thigh and hip. If you do not feel a stretch, slide your other foot forward slightly and then slowly lunge forward with your chest up until your knee once again lines up over your ankle. Make sure your tailbone continues to point downward. Hold this position for __________ seconds. Slowly return to the starting position. Repeat __________ times. Complete this exercise __________ times a day. Strengthening exercises These exercises build strength and endurance in your hip. Endurance is theability to use your muscles for a long time, even after they get tired. Bridge This exercise strengthens the muscles of your hip (hip extensors). Lie on your back on a firm surface with your knees bent and your feet flat on the floor. Tighten your buttocks muscles and lift your bottom off the floor until the trunk of your body and your hips are level with your thighs. Do not arch your back. You should feel the muscles working in your buttocks and the back of your thighs. If you do not feel these muscles, slide your feet 1-2 inches (2.5-5 cm) farther away from your buttocks. Hold this position for __________ seconds. Slowly lower your hips to the starting position. Let your muscles relax completely between repetitions. Repeat __________ times. Complete this exercise __________ times a day. Straight leg raises, side-lying This exercise strengthens the muscles that move the hip joint away from the center of the body (hip abductors). Lie on your side with  your left / right leg in the top position. Lie so your head, shoulder, hip, and knee line up. You may bend your bottom knee slightly to help you balance. Roll your hips slightly forward, so your hips are stacked directly over each other and your left / right knee is facing forward. Leading with your heel, lift your top leg 4-6 inches (10-15 cm). You should feel the muscles in your top hip lifting. Do not let your foot drift forward. Do not let your knee roll toward the ceiling. Hold this position for __________ seconds. Slowly return to the starting position. Let your muscles relax completely between repetitions. Repeat __________ times. Complete this exercise __________ times a day. Straight leg raises, side-lying This exercise strengthens the muscles that move the hip joint toward the center of the body (hip adductors). Lie on your side with your left / right leg in the bottom position. Lie so your head, shoulder, hip, and knee line up. You may place your  upper foot in front to help you balance. Roll your hips slightly forward, so your hips are stacked directly over each other and your left / right knee is facing forward. Tense the muscles in your inner thigh and lift your bottom leg 4-6 inches (10-15 cm). Hold this position for __________ seconds. Slowly return to the starting position. Let your muscles relax completely between repetitions. Repeat __________ times. Complete this exercise __________ times a day. Straight leg raises, supine This exercise strengthens the muscles in the front of your thigh (quadriceps). Lie on your back (supine position) with your left / right leg extended and your other knee bent. Tense the muscles in the front of your left / right thigh. You should see your kneecap slide up or see increased dimpling just above your knee. Keep these muscles tight as you raise your leg 4-6 inches (10-15 cm) off the floor. Do not let your knee bend. Hold this position for  __________ seconds. Keep these muscles tense as you lower your leg. Relax the muscles slowly and completely between repetitions. Repeat __________ times. Complete this exercise __________ times a day. Hip abductors, standing This exercise strengthens the muscles that move the leg and hip joint away from the center of the body (hip abductors). Tie one end of a rubber exercise band or tubing to a secure surface, such as a chair, table, or pole. Loop the other end of the band or tubing around your left / right ankle. Keeping your ankle with the band or tubing directly opposite the secured end, step away until there is tension in the tubing or band. Hold on to a chair, table, or pole as needed for balance. Lift your left / right leg out to your side. While you do this: Keep your back upright. Keep your shoulders over your hips. Keep your toes pointing forward. Make sure to use your hip muscles to slowly lift your leg. Do not tip your body or forcefully lift your leg. Hold this position for __________ seconds. Slowly return to the starting position. Repeat __________ times. Complete this exercise __________ times a day. Squats This exercise strengthens the muscles in the front of your thigh (quadriceps). Stand in a door frame so your feet and knees are in line with the frame. You may place your hands on the frame for balance. Slowly bend your knees and lower your hips like you are going to sit in a chair. Keep your lower legs in a straight-up-and-down position. Do not let your hips go lower than your knees. Do not bend your knees lower than told by your health care provider. If your hip pain increases, do not bend as low. Hold this position for ___________ seconds. Slowly push with your legs to return to standing. Do not use your hands to pull yourself to standing. Repeat __________ times. Complete this exercise __________ times a day. This information is not intended to replace advice given  to you by your health care provider. Make sure you discuss any questions you have with your healthcare provider. Document Revised: 03/10/2019 Document Reviewed: 06/15/2018 Elsevier Patient Education  [employer] Instructions: Return in about 3 weeks (around 03/21/2021).   Orders:  Orders Placed This Encounter  Procedures   XR HIP UNILAT W OR W/O PELVIS 2-3 VIEWS LEFT   CT HIP LEFT W WO CONTRAST   No orders of the defined types were placed in this encounter.     Procedures: No procedures performed  Clinical Data: No additional findings.   Subjective: Chief Complaint  Patient presents with   Left Hip - Pain    HPI  Review of Systems   Objective: Vital Signs: BP (!) 175/93   Pulse 81   Ht 5\' 6"  (1.676 m)   Wt 140 lb (63.5 kg)   BMI 22.60 kg/m   Physical Exam  Ortho Exam  Specialty Comments:  No specialty comments available.  Imaging: No results found.   PMFS History: Patient Active Problem List   Diagnosis Date Noted   Displaced intertrochanteric fracture of left femur, initial encounter for closed fracture ([facility]) 09/22/2019   Regurgitation of food 07/20/2019   Chronic midline low back pain without sciatica 10/22/2018   Primary insomnia 10/22/2018   Persistent atrial fibrillation ([facility]) 04/27/2017   Chronic diastolic CHF (congestive heart failure) ([facility]) 04/27/2017   CKD (chronic kidney disease) stage 3, GFR 30-59 ml/min ([facility]) 05/14/2015   BMI 26.0-26.9,adult 05/11/2015   Peripheral edema 07/06/2014   Hypokalemia 07/22/2013   Hypothyroidism 07/22/2013   Osteopenia, senile 03/09/2013   Constipation 01/04/2013   Hypertension 05/07/2012   Hyperlipidemia 05/07/2012   Diabetes mellitus type 2, diet-controlled ([facility]) 05/07/2012   GERD (gastroesophageal reflux disease) 05/07/2012   Past Medical History:  Diagnosis Date   Anxiety    Aortic insufficiency    a. Trivial AI by echo 02/2016.   Atrial fibrillation and flutter ([facility])    a.  Coarse afib vs flutter by EKG 12/2015.   Cancer ([facility])    skin cancer   Cataract    Chronic diastolic CHF (congestive heart failure) (HCC)    CKD (chronic kidney disease), stage III (H[facility])    COVID-19    GERD (gastroesophageal reflux disease)    Hiatal hernia    Hypercholesterolemia    Hypertension    Hypokalemia    Hypothyroidism    Left knee pain    NIDDM (non-insulin dependent diabetes mellitus)    diet controlled    Osteoporosis    Premature atrial contractions    PVC's (premature ventricular contractions)    Vitamin D deficiency     Family History  Problem Relation Age of Onset   Diabetes Mother    Stroke Mother    Heart disease Father    Stroke Father    Uterine cancer Sister    Diabetes Sister    Ovarian cancer Sister    Colon cancer Sister    Diabetes Sister    Liver cancer Sister        \   Diabetes Brother    Dementia Brother    Atrial fibrillation Sister    Diabetes Sister    Diabetes Son    Healthy Son    Heart attack Neg Hx    Hypertension Neg Hx    Esophageal cancer Neg Hx    Rectal cancer Neg Hx    Stomach cancer Neg Hx     Past Surgical History:  Procedure Laterality Date   A-FLUTTER ABLATION N/A 02/08/2019   Procedure: A-FLUTTER ABLATION;  Surgeon: Thompson Grayer, MD;  Location: [facility];  Service: Cardiovascular;  Laterality: N/A;   ABDOMINAL HYSTERECTOMY     APPENDECTOMY  1980   BACK SURGERY     CATARACT EXTRACTION, BILATERAL     CHOLECYSTECTOMY  5/00   COLONOSCOPY     implantable loop recorder placement  04/06/2019   MDT Reveal LINQ [id number] ([id number]) implanted for evaluation of palpitations and afib post atrial flutter ablation by Dr Rayann Heman in  office   INTRAMEDULLARY (IM) NAIL INTERTROCHANTERIC Left 09/23/2019   Procedure: INTRAMEDULLARY (IM) NAIL INTERTROCHANTRIC;  Surgeon: Shona Needles, MD;  Location: [facility];  Service: Orthopedics;  Laterality: Left;   TONSILLECTOMY     TOTAL ABDOMINAL HYSTERECTOMY W/ BILATERAL  SALPINGOOPHORECTOMY  1980   UPPER GASTROINTESTINAL ENDOSCOPY     Social History   Occupational History   Occupation: Retired    Comment: Teacher, adult education , golf, farm   Tobacco Use   Smoking status: Never   Smokeless tobacco: Never  Vaping Use   Vaping Use: Never used  Substance and Sexual Activity   Alcohol use: No   Drug use: No   Sexual activity: Not Currently

## 2021-02-28 NOTE — Patient Instructions (Addendum)
You have left hip pain and limping due to shortening of the left leg due to a left hip intertrochanteric Hip fracture. I appears healed but CT scan is the best way to assess that healing is complete. I will order the CT scan and recommend that you work on left hip abduction exercises to strengthen the muscles about the right hip.  Recommend a shoe lift for the left leg as it is nearly 3/4 inch shortened compared with the right hip length.  Hip Exercises Ask your health care provider which exercises are safe for you. Do exercises exactly as told by your health care provider and adjust them as directed. It is normal to feel mild stretching, pulling, tightness, or discomfort as you do these exercises. Stop right away if you feel sudden pain or your pain gets worse. Do not begin these exercises until told by your health care provider. Stretching and range-of-motion exercises These exercises warm up your muscles and joints and improve the movement and flexibility of your hip. These exercises also help to relieve pain, numbness, and tingling. You may be asked to limit your range of motion if you had a hipreplacement. Talk to your health care provider about these restrictions. Hamstrings, supine  Lie on your back (supine position). Loop a belt or towel over the ball of your left / right foot. The ball of your foot is on the walking surface, right under your toes. Straighten your left / right knee and slowly pull on the belt or towel to raise your leg until you feel a gentle stretch behind your knee (hamstring). Do not let your knee bend while you do this. Keep your other leg flat on the floor. Hold this position for __________ seconds. Slowly return your leg to the starting position. Repeat __________ times. Complete this exercise __________ times a day. Hip rotation  Lie on your back on a firm surface. With your left / right hand, gently pull your left / right knee toward the shoulder that is on the  same side of the body. Stop when your knee is pointing toward the ceiling. Hold your left / right ankle with your other hand. Keeping your knee steady, gently pull your left / right ankle toward your other shoulder until you feel a stretch in your buttocks. Keep your hips and shoulders firmly planted while you do this stretch. Hold this position for __________ seconds. Repeat __________ times. Complete this exercise __________ times a day. Seated stretch This exercise is sometimes called hamstrings and adductors stretch. Sit on the floor with your legs stretched wide. Keep your knees straight during this exercise. Keeping your head and back in a straight line, bend at your waist to reach for your left foot (position A). You should feel a stretch in your right inner thigh (adductors). Hold this position for __________ seconds. Then slowly return to the upright position. Keeping your head and back in a straight line, bend at your waist to reach forward (position B). You should feel a stretch behind both of your thighs and knees (hamstrings). Hold this position for __________ seconds. Then slowly return to the upright position. Keeping your head and back in a straight line, bend at your waist to reach for your right foot (position C). You should feel a stretch in your left inner thigh (adductors). Hold this position for __________ seconds. Then slowly return to the upright position. Repeat __________ times. Complete this exercise __________ times a day. Lunge This exercise stretches the muscles  of the hip (hip flexors). Place your left / right knee on the floor and bend your other knee so that is directly over your ankle. You should be half-kneeling. Keep good posture with your head over your shoulders. Tighten your buttocks to point your tailbone downward. This will prevent your back from arching too much. You should feel a gentle stretch in the front of your left / right thigh and hip. If you do  not feel a stretch, slide your other foot forward slightly and then slowly lunge forward with your chest up until your knee once again lines up over your ankle. Make sure your tailbone continues to point downward. Hold this position for __________ seconds. Slowly return to the starting position. Repeat __________ times. Complete this exercise __________ times a day. Strengthening exercises These exercises build strength and endurance in your hip. Endurance is theability to use your muscles for a long time, even after they get tired. Bridge This exercise strengthens the muscles of your hip (hip extensors). Lie on your back on a firm surface with your knees bent and your feet flat on the floor. Tighten your buttocks muscles and lift your bottom off the floor until the trunk of your body and your hips are level with your thighs. Do not arch your back. You should feel the muscles working in your buttocks and the back of your thighs. If you do not feel these muscles, slide your feet 1-2 inches (2.5-5 cm) farther away from your buttocks. Hold this position for __________ seconds. Slowly lower your hips to the starting position. Let your muscles relax completely between repetitions. Repeat __________ times. Complete this exercise __________ times a day. Straight leg raises, side-lying This exercise strengthens the muscles that move the hip joint away from the center of the body (hip abductors). Lie on your side with your left / right leg in the top position. Lie so your head, shoulder, hip, and knee line up. You may bend your bottom knee slightly to help you balance. Roll your hips slightly forward, so your hips are stacked directly over each other and your left / right knee is facing forward. Leading with your heel, lift your top leg 4-6 inches (10-15 cm). You should feel the muscles in your top hip lifting. Do not let your foot drift forward. Do not let your knee roll toward the ceiling. Hold this  position for __________ seconds. Slowly return to the starting position. Let your muscles relax completely between repetitions. Repeat __________ times. Complete this exercise __________ times a day. Straight leg raises, side-lying This exercise strengthens the muscles that move the hip joint toward the center of the body (hip adductors). Lie on your side with your left / right leg in the bottom position. Lie so your head, shoulder, hip, and knee line up. You may place your upper foot in front to help you balance. Roll your hips slightly forward, so your hips are stacked directly over each other and your left / right knee is facing forward. Tense the muscles in your inner thigh and lift your bottom leg 4-6 inches (10-15 cm). Hold this position for __________ seconds. Slowly return to the starting position. Let your muscles relax completely between repetitions. Repeat __________ times. Complete this exercise __________ times a day. Straight leg raises, supine This exercise strengthens the muscles in the front of your thigh (quadriceps). Lie on your back (supine position) with your left / right leg extended and your other knee bent. Tense the muscles  in the front of your left / right thigh. You should see your kneecap slide up or see increased dimpling just above your knee. Keep these muscles tight as you raise your leg 4-6 inches (10-15 cm) off the floor. Do not let your knee bend. Hold this position for __________ seconds. Keep these muscles tense as you lower your leg. Relax the muscles slowly and completely between repetitions. Repeat __________ times. Complete this exercise __________ times a day. Hip abductors, standing This exercise strengthens the muscles that move the leg and hip joint away from the center of the body (hip abductors). Tie one end of a rubber exercise band or tubing to a secure surface, such as a chair, table, or pole. Loop the other end of the band or tubing around  your left / right ankle. Keeping your ankle with the band or tubing directly opposite the secured end, step away until there is tension in the tubing or band. Hold on to a chair, table, or pole as needed for balance. Lift your left / right leg out to your side. While you do this: Keep your back upright. Keep your shoulders over your hips. Keep your toes pointing forward. Make sure to use your hip muscles to slowly lift your leg. Do not tip your body or forcefully lift your leg. Hold this position for __________ seconds. Slowly return to the starting position. Repeat __________ times. Complete this exercise __________ times a day. Squats This exercise strengthens the muscles in the front of your thigh (quadriceps). Stand in a door frame so your feet and knees are in line with the frame. You may place your hands on the frame for balance. Slowly bend your knees and lower your hips like you are going to sit in a chair. Keep your lower legs in a straight-up-and-down position. Do not let your hips go lower than your knees. Do not bend your knees lower than told by your health care provider. If your hip pain increases, do not bend as low. Hold this position for ___________ seconds. Slowly push with your legs to return to standing. Do not use your hands to pull yourself to standing. Repeat __________ times. Complete this exercise __________ times a day. This information is not intended to replace advice given to you by your health care provider. Make sure you discuss any questions you have with your healthcare provider. Document Revised: 03/10/2019 Document Reviewed: 06/15/2018 Elsevier Patient Education  2022 [employer].

## 2021-03-04 ENCOUNTER — Encounter: Payer: Self-pay | Admitting: Nurse Practitioner

## 2021-03-04 ENCOUNTER — Other Ambulatory Visit: Payer: Self-pay

## 2021-03-04 ENCOUNTER — Ambulatory Visit (INDEPENDENT_AMBULATORY_CARE_PROVIDER_SITE_OTHER): Payer: Medicare Other | Admitting: Nurse Practitioner

## 2021-03-04 VITALS — BP 182/85 | HR 85 | Temp 97.7°F | Resp 20 | Ht 66.0 in | Wt 140.0 lb

## 2021-03-04 DIAGNOSIS — R6889 Other general symptoms and signs: Secondary | ICD-10-CM | POA: Diagnosis not present

## 2021-03-04 DIAGNOSIS — I10 Essential (primary) hypertension: Secondary | ICD-10-CM | POA: Diagnosis not present

## 2021-03-04 MED ORDER — BENAZEPRIL HCL 20 MG PO TABS
20.0000 mg | ORAL_TABLET | Freq: Every day | ORAL | 3 refills | Status: DC
Start: 2021-03-04 — End: 2021-04-02

## 2021-03-04 NOTE — Progress Notes (Signed)
   Subjective:    Patient ID: CHALISE PE, female    DOB: Mar 09, 1932, 87 y.o.   MRN: [id number]   Chief Complaint: Blood Pressure Check (Saw ortho Thursday and it ws 175/85)   HPI Patient comes in today to recheck blood pressure. She went to see Dr.Nidka on Thursday and blood pressure was elevated. He sent me a message stating that she needed to increase her vitamin d and start on b12 injections. There are no lab results in computer to reflect that. We saw her on 10/02/20 with elevated blood pressure. We increased her benazepril to 10mg  at last visit and that has not lowered her blood pressure at all. She is still on clonidine but only takes it at night time.  BP Readings from Last 3 Encounters:  03/04/21 (!) 182/85  02/28/21 (!) 175/93  10/02/20 (!) 166/78      Review of Systems  Eyes:  Negative for visual disturbance.  Respiratory:  Negative for chest tightness and shortness of breath.   Cardiovascular:  Negative for chest pain, palpitations and leg swelling.  Neurological:  Positive for headaches (occasionally). Negative for dizziness and numbness.  All other systems reviewed and are negative.     Objective:   Physical Exam Vitals and nursing note reviewed.  Constitutional:      Appearance: Normal appearance.  Cardiovascular:     Rate and Rhythm: Normal rate and regular rhythm.     Heart sounds: Normal heart sounds.  Pulmonary:     Effort: Pulmonary effort is normal.     Breath sounds: Normal breath sounds.  Skin:    General: Skin is warm.  Neurological:     General: No focal deficit present.     Mental Status: She is alert.  Psychiatric:        Mood and Affect: Mood normal.        Behavior: Behavior normal.   BP (!) 182/85   Pulse 85   Temp 97.7 F (36.5 C) (Temporal)   Resp 20   Ht 5\' 6"  (1.676 m)   Wt 140 lb (63.5 kg)   SpO2 94%   BMI 22.60 kg/m          Assessment & Plan:   BETTYANNE DITTMAN in today with chief complaint of Blood Pressure Check (Saw  ortho Thursday and it ws 175/85)   1. Primary hypertension Increase benazepril to 20mg  daily Labs pending - benazepril (LOTENSIN) 20 MG tablet; Take 1 tablet (20 mg total) by mouth daily.  Dispense: 90 tablet; Refill: 3    The above assessment and management plan was discussed with the patient. The patient verbalized understanding of and has agreed to the management plan. Patient is aware to call the clinic if symptoms persist or worsen. Patient is aware when to return to the clinic for a follow-up visit. Patient educated on when it is appropriate to go to the emergency department.   Mary-Margaret Hassell Done, FNP

## 2021-03-04 NOTE — Patient Instructions (Signed)
https://www.nhlbi.nih.gov/files/docs/public/heart/dash_brief.pdf">  DASH Eating Plan DASH stands for Dietary Approaches to Stop Hypertension. The DASH eating plan is a healthy eating plan that has been shown to: Reduce high blood pressure (hypertension). Reduce your risk for type 2 diabetes, heart disease, and stroke. Help with weight loss. What are tips for following this plan? Reading food labels Check food labels for the amount of salt (sodium) per serving. Choose foods with less than 5 percent of the Daily Value of sodium. Generally, foods with less than 300 milligrams (mg) of sodium per serving fit into this eating plan. To find whole grains, look for the word "whole" as the first word in the ingredient list. Shopping Buy products labeled as "low-sodium" or "no salt added." Buy fresh foods. Avoid canned foods and pre-made or frozen meals. Cooking Avoid adding salt when cooking. Use salt-free seasonings or herbs instead of table salt or sea salt. Check with your health care provider or pharmacist before using salt substitutes. Do not fry foods. Cook foods using healthy methods such as baking, boiling, grilling, roasting, and broiling instead. Cook with heart-healthy oils, such as olive, canola, avocado, soybean, or sunflower oil. Meal planning  Eat a balanced diet that includes: 4 or more servings of fruits and 4 or more servings of vegetables each day. Try to fill one-half of your plate with fruits and vegetables. 6-8 servings of whole grains each day. Less than 6 oz (170 g) of lean meat, poultry, or fish each day. A 3-oz (85-g) serving of meat is about the same size as a deck of cards. One egg equals 1 oz (28 g). 2-3 servings of low-fat dairy each day. One serving is 1 cup (237 mL). 1 serving of nuts, seeds, or beans 5 times each week. 2-3 servings of heart-healthy fats. Healthy fats called omega-3 fatty acids are found in foods such as walnuts, flaxseeds, fortified milks, and eggs.  These fats are also found in cold-water fish, such as sardines, salmon, and mackerel. Limit how much you eat of: Canned or prepackaged foods. Food that is high in trans fat, such as some fried foods. Food that is high in saturated fat, such as fatty meat. Desserts and other sweets, sugary drinks, and other foods with added sugar. Full-fat dairy products. Do not salt foods before eating. Do not eat more than 4 egg yolks a week. Try to eat at least 2 vegetarian meals a week. Eat more home-cooked food and less restaurant, buffet, and fast food.  Lifestyle When eating at a restaurant, ask that your food be prepared with less salt or no salt, if possible. If you drink alcohol: Limit how much you use to: 0-1 drink a day for women who are not pregnant. 0-2 drinks a day for men. Be aware of how much alcohol is in your drink. In the [place] one drink equals one 12 oz bottle of beer (355 mL), one 5 oz glass of wine (148 mL), or one 1 oz glass of hard liquor (44 mL). General information Avoid eating more than 2,300 mg of salt a day. If you have hypertension, you may need to reduce your sodium intake to 1,500 mg a day. Work with your health care provider to maintain a healthy body weight or to lose weight. Ask what an ideal weight is for you. Get at least 30 minutes of exercise that causes your heart to beat faster (aerobic exercise) most days of the week. Activities may include walking, swimming, or biking. Work with your health care provider   or dietitian to adjust your eating plan to your individual calorie needs. What foods should I eat? Fruits All fresh, dried, or frozen fruit. Canned fruit in natural juice (without addedsugar). Vegetables Fresh or frozen vegetables (raw, steamed, roasted, or grilled). Low-sodium or reduced-sodium tomato and vegetable juice. Low-sodium or reduced-sodium tomatosauce and tomato paste. Low-sodium or reduced-sodium canned vegetables. Grains Whole-grain or  whole-wheat bread. Whole-grain or whole-wheat pasta. Brown rice. Oatmeal. Quinoa. Bulgur. Whole-grain and low-sodium cereals. Pita bread.Low-fat, low-sodium crackers. Whole-wheat flour tortillas. Meats and other proteins Skinless chicken or turkey. Ground chicken or turkey. Pork with fat trimmed off. Fish and seafood. Egg whites. Dried beans, peas, or lentils. Unsalted nuts, nut butters, and seeds. Unsalted canned beans. Lean cuts of beef with fat trimmed off. Low-sodium, lean precooked or cured meat, such as sausages or meatloaves. Dairy Low-fat (1%) or fat-free (skim) milk. Reduced-fat, low-fat, or fat-free cheeses. Nonfat, low-sodium ricotta or cottage cheese. Low-fat or nonfatyogurt. Low-fat, low-sodium cheese. Fats and oils Soft margarine without trans fats. Vegetable oil. Reduced-fat, low-fat, or light mayonnaise and salad dressings (reduced-sodium). Canola, safflower, olive, avocado, soybean, andsunflower oils. Avocado. Seasonings and condiments Herbs. Spices. Seasoning mixes without salt. Other foods Unsalted popcorn and pretzels. Fat-free sweets. The items listed above may not be a complete list of foods and beverages you can eat. Contact a dietitian for more information. What foods should I avoid? Fruits Canned fruit in a light or heavy syrup. Fried fruit. Fruit in cream or buttersauce. Vegetables Creamed or fried vegetables. Vegetables in a cheese sauce. Regular canned vegetables (not low-sodium or reduced-sodium). Regular canned tomato sauce and paste (not low-sodium or reduced-sodium). Regular tomato and vegetable juice(not low-sodium or reduced-sodium). Pickles. Olives. Grains Baked goods made with fat, such as croissants, muffins, or some breads. Drypasta or rice meal packs. Meats and other proteins Fatty cuts of meat. Ribs. Fried meat. Bacon. Bologna, salami, and other precooked or cured meats, such as sausages or meat loaves. Fat from the back of a pig (fatback). Bratwurst.  Salted nuts and seeds. Canned beans with added salt. Canned orsmoked fish. Whole eggs or egg yolks. Chicken or turkey with skin. Dairy Whole or 2% milk, cream, and half-and-half. Whole or full-fat cream cheese. Whole-fat or sweetened yogurt. Full-fat cheese. Nondairy creamers. Whippedtoppings. Processed cheese and cheese spreads. Fats and oils Butter. Stick margarine. Lard. Shortening. Ghee. Bacon fat. Tropical oils, suchas coconut, palm kernel, or palm oil. Seasonings and condiments Onion salt, garlic salt, seasoned salt, table salt, and sea salt. Worcestershire sauce. Tartar sauce. Barbecue sauce. Teriyaki sauce. Soy sauce, including reduced-sodium. Steak sauce. Canned and packaged gravies. Fish sauce. Oyster sauce. Cocktail sauce. Store-bought horseradish. Ketchup. Mustard. Meat flavorings and tenderizers. Bouillon cubes. Hot sauces. Pre-made or packaged marinades. Pre-made or packaged taco seasonings. Relishes. Regular saladdressings. Other foods Salted popcorn and pretzels. The items listed above may not be a complete list of foods and beverages you should avoid. Contact a dietitian for more information. Where to find more information National Heart, Lung, and Blood Institute: www.nhlbi.nih.gov American Heart Association: www.heart.org Academy of Nutrition and Dietetics: www.eatright.org National Kidney Foundation: www.kidney.org Summary The DASH eating plan is a healthy eating plan that has been shown to reduce high blood pressure (hypertension). It may also reduce your risk for type 2 diabetes, heart disease, and stroke. When on the DASH eating plan, aim to eat more fresh fruits and vegetables, whole grains, lean proteins, low-fat dairy, and heart-healthy fats. With the DASH eating plan, you should limit salt (sodium) intake to 2,300   mg a day. If you have hypertension, you may need to reduce your sodium intake to 1,500 mg a day. Work with your health care provider or dietitian to adjust  your eating plan to your individual calorie needs. This information is not intended to replace advice given to you by your health care provider. Make sure you discuss any questions you have with your healthcare provider. Document Revised: 07/08/2019 Document Reviewed: 07/08/2019 Elsevier Patient Education  2022 Elsevier Inc.  

## 2021-03-05 LAB — CMP14+EGFR
ALT: 17 IU/L (ref 0–32)
AST: 26 IU/L (ref 0–40)
Albumin/Globulin Ratio: 2.1 (ref 1.2–2.2)
Albumin: 4.9 g/dL — ABNORMAL HIGH (ref 3.6–4.6)
Alkaline Phosphatase: 101 IU/L (ref 44–121)
BUN/Creatinine Ratio: 18 (ref 12–28)
BUN: 19 mg/dL (ref 8–27)
Bilirubin Total: 0.3 mg/dL (ref 0.0–1.2)
CO2: 27 mmol/L (ref 20–29)
Calcium: 9.7 mg/dL (ref 8.7–10.3)
Chloride: 97 mmol/L (ref 96–106)
Creatinine, Ser: 1.04 mg/dL — ABNORMAL HIGH (ref 0.57–1.00)
Globulin, Total: 2.3 g/dL (ref 1.5–4.5)
Glucose: 103 mg/dL — ABNORMAL HIGH (ref 65–99)
Potassium: 4.4 mmol/L (ref 3.5–5.2)
Sodium: 139 mmol/L (ref 134–144)
Total Protein: 7.2 g/dL (ref 6.0–8.5)
eGFR: 52 mL/min/{1.73_m2} — ABNORMAL LOW (ref >59)

## 2021-03-05 LAB — VITAMIN B12: Vitamin B-12: 288 pg/mL (ref 232–1245)

## 2021-03-05 LAB — VITAMIN D 25 HYDROXY (VIT D DEFICIENCY, FRACTURES): Vit D, 25-Hydroxy: 48.1 ng/mL (ref 30.0–100.0)

## 2021-03-08 ENCOUNTER — Telehealth: Payer: Self-pay | Admitting: Nurse Practitioner

## 2021-03-08 NOTE — Telephone Encounter (Signed)
Patient aware of results.

## 2021-03-12 ENCOUNTER — Other Ambulatory Visit: Payer: Self-pay | Admitting: Nurse Practitioner

## 2021-03-12 DIAGNOSIS — M8580 Other specified disorders of bone density and structure, unspecified site: Secondary | ICD-10-CM

## 2021-03-21 NOTE — Progress Notes (Signed)
Carelink Summary Report / Loop Recorder 

## 2021-04-01 ENCOUNTER — Other Ambulatory Visit: Payer: Self-pay

## 2021-04-01 ENCOUNTER — Ambulatory Visit: Payer: Medicare Other | Admitting: Nurse Practitioner

## 2021-04-01 ENCOUNTER — Ambulatory Visit (HOSPITAL_BASED_OUTPATIENT_CLINIC_OR_DEPARTMENT_OTHER)
Admission: RE | Admit: 2021-04-01 | Discharge: 2021-04-01 | Disposition: A | Discharge status: Home / Self Care | Payer: Medicare Other | Source: Ambulatory Visit | Attending: Specialist | Admitting: Specialist

## 2021-04-01 ENCOUNTER — Ambulatory Visit (INDEPENDENT_AMBULATORY_CARE_PROVIDER_SITE_OTHER): Payer: Medicare Other

## 2021-04-01 DIAGNOSIS — I495 Sick sinus syndrome: Secondary | ICD-10-CM | POA: Diagnosis not present

## 2021-04-01 DIAGNOSIS — M25559 Pain in unspecified hip: Secondary | ICD-10-CM | POA: Diagnosis not present

## 2021-04-01 DIAGNOSIS — M1612 Unilateral primary osteoarthritis, left hip: Secondary | ICD-10-CM | POA: Diagnosis not present

## 2021-04-01 DIAGNOSIS — M2170 Unequal limb length (acquired), unspecified site: Secondary | ICD-10-CM | POA: Insufficient documentation

## 2021-04-01 DIAGNOSIS — I4819 Other persistent atrial fibrillation: Secondary | ICD-10-CM

## 2021-04-01 DIAGNOSIS — S72142G Displaced intertrochanteric fracture of left femur, subsequent encounter for closed fracture with delayed healing: Secondary | ICD-10-CM

## 2021-04-01 DIAGNOSIS — M25552 Pain in left hip: Secondary | ICD-10-CM | POA: Diagnosis not present

## 2021-04-01 MED ORDER — IOHEXOL 350 MG/ML SOLN
60.0000 mL | Freq: Once | INTRAVENOUS | Status: AC | PRN
  Administered 2021-04-01: 60 mL via INTRAVENOUS

## 2021-04-02 ENCOUNTER — Telehealth: Payer: Self-pay | Admitting: Nurse Practitioner

## 2021-04-02 ENCOUNTER — Ambulatory Visit (INDEPENDENT_AMBULATORY_CARE_PROVIDER_SITE_OTHER): Payer: Medicare Other | Admitting: Nurse Practitioner

## 2021-04-02 ENCOUNTER — Encounter: Payer: Self-pay | Admitting: Nurse Practitioner

## 2021-04-02 VITALS — BP 161/87 | HR 84 | Temp 97.7°F | Resp 20 | Ht 66.0 in | Wt 140.0 lb

## 2021-04-02 DIAGNOSIS — M545 Low back pain, unspecified: Secondary | ICD-10-CM

## 2021-04-02 DIAGNOSIS — E876 Hypokalemia: Secondary | ICD-10-CM

## 2021-04-02 DIAGNOSIS — R609 Edema, unspecified: Secondary | ICD-10-CM | POA: Diagnosis not present

## 2021-04-02 DIAGNOSIS — E119 Type 2 diabetes mellitus without complications: Secondary | ICD-10-CM

## 2021-04-02 DIAGNOSIS — I10 Essential (primary) hypertension: Secondary | ICD-10-CM | POA: Diagnosis not present

## 2021-04-02 DIAGNOSIS — F5101 Primary insomnia: Secondary | ICD-10-CM | POA: Diagnosis not present

## 2021-04-02 DIAGNOSIS — G8929 Other chronic pain: Secondary | ICD-10-CM

## 2021-04-02 DIAGNOSIS — E782 Mixed hyperlipidemia: Secondary | ICD-10-CM | POA: Diagnosis not present

## 2021-04-02 DIAGNOSIS — I4819 Other persistent atrial fibrillation: Secondary | ICD-10-CM | POA: Diagnosis not present

## 2021-04-02 DIAGNOSIS — E039 Hypothyroidism, unspecified: Secondary | ICD-10-CM | POA: Diagnosis not present

## 2021-04-02 DIAGNOSIS — N1832 Chronic kidney disease, stage 3b: Secondary | ICD-10-CM

## 2021-04-02 DIAGNOSIS — M8580 Other specified disorders of bone density and structure, unspecified site: Secondary | ICD-10-CM | POA: Diagnosis not present

## 2021-04-02 DIAGNOSIS — I5032 Chronic diastolic (congestive) heart failure: Secondary | ICD-10-CM | POA: Diagnosis not present

## 2021-04-02 DIAGNOSIS — K219 Gastro-esophageal reflux disease without esophagitis: Secondary | ICD-10-CM

## 2021-04-02 DIAGNOSIS — Z6826 Body mass index (BMI) 26.0-26.9, adult: Secondary | ICD-10-CM

## 2021-04-02 LAB — CUP PACEART REMOTE DEVICE CHECK
Date Time Interrogation Session: 20220808014141
Implantable Pulse Generator Implant Date: 20200819

## 2021-04-02 LAB — BAYER DCA HB A1C WAIVED: HB A1C (BAYER DCA - WAIVED): 6 % (ref <7.0)

## 2021-04-02 MED ORDER — CLONIDINE HCL 0.1 MG PO TABS: 0.1000 mg | ORAL_TABLET | Freq: Every day | ORAL | 1 refills | Status: DC

## 2021-04-02 MED ORDER — FAMOTIDINE 20 MG PO TABS: 20.0000 mg | ORAL_TABLET | Freq: Every day | ORAL | 1 refills | Status: DC

## 2021-04-02 MED ORDER — BENAZEPRIL HCL 20 MG PO TABS: 20.0000 mg | ORAL_TABLET | Freq: Every day | ORAL | 1 refills | Status: DC

## 2021-04-02 MED ORDER — LEVOTHYROXINE SODIUM 75 MCG PO TABS: 75.0000 ug | ORAL_TABLET | Freq: Every day | ORAL | 1 refills | Status: DC

## 2021-04-02 MED ORDER — GI COCKTAIL ~~LOC~~: 30.0000 mL | Freq: Every day | ORAL | 2 refills | Status: DC

## 2021-04-02 MED ORDER — PANTOPRAZOLE SODIUM 40 MG PO TBEC: 40.0000 mg | DELAYED_RELEASE_TABLET | Freq: Every day | ORAL | 1 refills | Status: DC

## 2021-04-02 MED ORDER — POTASSIUM CHLORIDE CRYS ER 20 MEQ PO TBCR: 20.0000 meq | EXTENDED_RELEASE_TABLET | Freq: Every day | ORAL | 1 refills | Status: DC

## 2021-04-02 MED ORDER — RALOXIFENE HCL 60 MG PO TABS: 60.0000 mg | ORAL_TABLET | Freq: Every day | ORAL | 1 refills | Status: DC

## 2021-04-02 MED ORDER — RIVAROXABAN 15 MG PO TABS: 15.0000 mg | ORAL_TABLET | Freq: Every day | ORAL | 1 refills | Status: DC

## 2021-04-02 MED ORDER — FUROSEMIDE 40 MG PO TABS: 40.0000 mg | ORAL_TABLET | Freq: Every day | ORAL | 1 refills | Status: DC

## 2021-04-02 MED ORDER — TRAMADOL HCL 50 MG PO TABS: 50.0000 mg | ORAL_TABLET | Freq: Three times a day (TID) | ORAL | 1 refills | Status: DC | PRN

## 2021-04-02 MED ORDER — BENAZEPRIL HCL 40 MG PO TABS: 40.0000 mg | ORAL_TABLET | Freq: Every day | ORAL | 1 refills | Status: DC

## 2021-04-02 MED ORDER — LOVASTATIN 40 MG PO TABS: 40.0000 mg | ORAL_TABLET | Freq: Every day | ORAL | 1 refills | Status: DC

## 2021-04-02 NOTE — Progress Notes (Signed)
Subjective:    Patient ID: Katie [name], female    DOB: 22-Nov-1931, 87 y.o.   MRN: [id number]  Chief Complaint: Medical Management of Chronic Issues    HPI:  1. Primary hypertension No c/o chest pain, sob or headache. Does not check blood pressure at home. Blood pressure is always high here. BP Readings from Last 3 Encounters:  04/02/21 (!) 161/87  03/04/21 (!) 182/85  02/28/21 (!) 175/93      2. Diabetes mellitus type 2, diet-controlled ([place]) She does not check her blood sugars at home. She has been eating a lot of sweets at home she says. Lab Results  Component Value Date   HGBA1C 6.1 10/02/2020     3. Mixed hyperlipidemia Doe stry to watch diet but does little to no exercise. Lab Results  Component Value Date   CHOL 146 10/02/2020   HDL 66 10/02/2020   LDLCALC 58 10/02/2020   TRIG 127 10/02/2020   CHOLHDL 2.2 10/02/2020     4. Chronic diastolic CHF (congestive heart failure) ([place]) Has not seen cardiology in person in awhile.  5. Persistent atrial fibrillation ([place]) He is monitored by atrial fib clinical and they contact her monthly. Is on xeralto with no bleeding issues. Sh has a monitor that monitors her atrial fib at night.  6. Gastroesophageal reflux disease without esophagitis Is on protonix and pepcid daily. Is doing well. Has occasional symptoms. She does gi cocktail daily.  7. Acquired hypothyroidism No problems that she is aware of. We increased her synthorid to 65mg at last visit. Lab Results  Component Value Date   TSH 5.480 (H) 10/02/2020     8. Stage 3b chronic kidney disease (HCC) No problems voiding-  Lab Results  Component Value Date   CREATININE 1.04 (H) 03/04/2021     9. Hypokalemia No lower ext cramping. Lab Results  Component Value Date   K 4.4 03/04/2021     10. Peripheral edema Has occasional edema by the end of the day. Will usually resolve at night. Is on daily lasix.  11. Primary insomnia Sleeps about 6-7 hours  a  night. Has trouble falling asleep occasionally.  12. Osteopenia, senile Last dexascan ws done 2019. Patient does not want to repeat.  13. BMI 26.0-26.9,adult No recent weight changes Wt Readings from Last 3 Encounters:  04/02/21 140 lb (63.5 kg)  03/04/21 140 lb (63.5 kg)  02/28/21 140 lb (63.5 kg)   BMI Readings from Last 3 Encounters:  04/02/21 22.60 kg/m  03/04/21 22.60 kg/m  02/28/21 22.60 kg/m       Outpatient Encounter Medications as of 04/02/2021  Medication Sig   Alum & Mag Hydroxide-Simeth (GI COCKTAIL) SUSP suspension Take 30 mLs by mouth at bedtime. Shake well.   benazepril (LOTENSIN) 20 MG tablet Take 1 tablet (20 mg total) by mouth daily.   Cholecalciferol (VITAMIN D-3) 125 MCG (5000 UT) TABS Take 5,000 Units by mouth daily.   cloNIDine (CATAPRES) 0.1 MG tablet Take 1 tablet (0.1 mg total) by mouth at bedtime.   famotidine (PEPCID) 20 MG tablet Take 1 tablet (20 mg total) by mouth at bedtime.   feeding supplement, ENSURE ENLIVE, (ENSURE ENLIVE) LIQD Take 237 mLs by mouth 2 (two) times daily between meals.   fluticasone (FLONASE) 50 MCG/ACT nasal spray Place 2 sprays into both nostrils daily.   furosemide (LASIX) 40 MG tablet Take 1 tablet (40 mg total) by mouth daily.   levothyroxine (SYNTHROID) 75 MCG tablet Take 1 tablet (75  mcg total) by mouth daily.   lovastatin (MEVACOR) 40 MG tablet Take 1 tablet (40 mg total) by mouth at bedtime.   Multiple Vitamin (MULTIVITAMIN WITH MINERALS) TABS tablet Take 1 tablet by mouth daily.   pantoprazole (PROTONIX) 40 MG tablet TAKE 1 TABLET DAILY   potassium chloride SA (KLOR-CON) 20 MEQ tablet Take 1 tablet (20 mEq total) by mouth daily.   raloxifene (EVISTA) 60 MG tablet TAKE 1 TABLET DAILY   Rivaroxaban (XARELTO) 15 MG TABS tablet Take 1 tablet (15 mg total) by mouth daily with supper.   traMADol (ULTRAM) 50 MG tablet Take 1 tablet (50 mg total) by mouth every 8 (eight) hours as needed.   No facility-administered  encounter medications on file as of 04/02/2021.    Past Surgical History:  Procedure Laterality Date   A-FLUTTER ABLATION N/A 02/08/2019   Procedure: A-FLUTTER ABLATION;  Surgeon: Thompson Grayer, MD;  Location: [facility];  Service: Cardiovascular;  Laterality: N/A;   ABDOMINAL HYSTERECTOMY     APPENDECTOMY  1980   BACK SURGERY     CATARACT EXTRACTION, BILATERAL     CHOLECYSTECTOMY  5/00   COLONOSCOPY     implantable loop recorder placement  04/06/2019   MDT Reveal LINQ [id number] ([id number]) implanted for evaluation of palpitations and afib post atrial flutter ablation by Dr Rayann Heman in office   INTRAMEDULLARY (IM) NAIL INTERTROCHANTERIC Left 09/23/2019   Procedure: INTRAMEDULLARY (IM) NAIL INTERTROCHANTRIC;  Surgeon: Shona Needles, MD;  Location: [facility];  Service: Orthopedics;  Laterality: Left;   TONSILLECTOMY     TOTAL ABDOMINAL HYSTERECTOMY W/ BILATERAL SALPINGOOPHORECTOMY  1980   UPPER GASTROINTESTINAL ENDOSCOPY      Family History  Problem Relation Age of Onset   Diabetes Mother    Stroke Mother    Heart disease Father    Stroke Father    Uterine cancer Sister    Diabetes Sister    Ovarian cancer Sister    Colon cancer Sister    Diabetes Sister    Liver cancer Sister        \   Diabetes Brother    Dementia Brother    Atrial fibrillation Sister    Diabetes Sister    Diabetes Son    Healthy Son    Heart attack Neg Hx    Hypertension Neg Hx    Esophageal cancer Neg Hx    Rectal cancer Neg Hx    Stomach cancer Neg Hx     New complaints: Had scan done of left hip yesterday and is waiting on results. She broke that hip in 2020.  Social history: Lives by herself  Controlled substance contract: 09/07/19     Review of Systems  Constitutional:  Negative for diaphoresis.  Eyes:  Negative for pain.  Respiratory:  Negative for shortness of breath.   Cardiovascular:  Negative for chest pain, palpitations and leg swelling.  Gastrointestinal:  Negative for  abdominal pain.  Endocrine: Negative for polydipsia.  Skin:  Negative for rash.  Neurological:  Negative for dizziness, weakness and headaches.  Hematological:  Does not bruise/bleed easily.  All other systems reviewed and are negative.     Objective:   Physical Exam Vitals and nursing note reviewed.  Constitutional:      General: She is not in acute distress.    Appearance: Normal appearance. She is well-developed.  HENT:     Head: Normocephalic.     Right Ear: Tympanic membrane normal.     Left Ear: Tympanic  membrane normal.     Nose: Nose normal.     Mouth/Throat:     Mouth: Mucous membranes are moist.  Eyes:     Pupils: Pupils are equal, round, and reactive to light.  Neck:     Vascular: No carotid bruit or JVD.  Cardiovascular:     Rate and Rhythm: Normal rate and regular rhythm.     Heart sounds: Normal heart sounds.  Pulmonary:     Effort: Pulmonary effort is normal. No respiratory distress.     Breath sounds: Normal breath sounds. No wheezing or rales.  Chest:     Chest wall: No tenderness.  Abdominal:     General: Bowel sounds are normal. There is no distension or abdominal bruit.     Palpations: Abdomen is soft. There is no hepatomegaly, splenomegaly, mass or pulsatile mass.     Tenderness: There is no abdominal tenderness.  Musculoskeletal:        General: Normal range of motion.     Cervical back: Normal range of motion and neck supple.  Lymphadenopathy:     Cervical: No cervical adenopathy.  Skin:    General: Skin is warm and dry.  Neurological:     Mental Status: She is alert and oriented to person, place, and time.     Deep Tendon Reflexes: Reflexes are normal and symmetric.  Psychiatric:        Behavior: Behavior normal.        Thought Content: Thought content normal.        Judgment: Judgment normal.    BP (!) 161/87   Pulse 84   Temp 97.7 F (36.5 C) (Temporal)   Resp 20   Ht '5\' 6"'  (1.676 m)   Wt 140 lb (63.5 kg)   SpO2 99%   BMI 22.60  kg/m   Hgba1c 6.0     Assessment & Plan:  ANGELISE PETRICH comes in today with chief complaint of Medical Management of Chronic Issues   Diagnosis and orders addressed:  1. Primary hypertension Increased benazepril to 78m daily - CBC with Differential/Platelet - CMP14+EGFR - cloNIDine (CATAPRES) 0.1 MG tablet; Take 1 tablet (0.1 mg total) by mouth at bedtime.  Dispense: 180 tablet; Refill: 1 - benazepril (LOTENSIN) 40 MG tablet; Take 1 tablet (40 mg total) by mouth daily.  Dispense: 90 tablet; Refill: 1  2. Diabetes mellitus type 2, diet-controlled ([place] Continue to watch carbs in diet - Bayer DCA Hb A1c Waived  3. Mixed hyperlipidemia Low fat diet - Lipid panel - lovastatin (MEVACOR) 40 MG tablet; Take 1 tablet (40 mg total) by mouth at bedtime.  Dispense: 90 tablet; Refill: 1  4. Chronic diastolic CHF (congestive heart failure) [place] Keep follow up with cardiology  5. Persistent atrial fibrillation (HCC) Continue with atrial fib clinic - Rivaroxaban (XARELTO) 15 MG TABS tablet; Take 1 tablet (15 mg total) by mouth daily with supper.  Dispense: 90 tablet; Refill: 1  6. Gastroesophageal reflux disease without esophagitis Avoid spicy foods Do not eat 2 hours prior to bedtime - Alum & Mag Hydroxide-Simeth (GI COCKTAIL) SUSP suspension; Take 30 mLs by mouth at bedtime. Shake well.  Dispense: 900 mL; Refill: 2 - pantoprazole (PROTONIX) 40 MG tablet; Take 1 tablet (40 mg total) by mouth daily.  Dispense: 90 tablet; Refill: 1 - famotidine (PEPCID) 20 MG tablet; Take 1 tablet (20 mg total) by mouth at bedtime.  Dispense: 90 tablet; Refill: 1  7. Acquired hypothyroidism Labs pending - levothyroxine (SYNTHROID) 75  MCG tablet; Take 1 tablet (75 mcg total) by mouth daily.  Dispense: 90 tablet; Refill: 1  8. Stage 3b chronic kidney disease ([place]) Labs pending  9. Hypokalemia Labs pending - potassium chloride SA (KLOR-CON) 20 MEQ tablet; Take 1 tablet (20 mEq total) by mouth daily.   Dispense: 90 tablet; Refill: 1  10. Peripheral edema Elevate legs when sitting - furosemide (LASIX) 40 MG tablet; Take 1 tablet (40 mg total) by mouth daily.  Dispense: 90 tablet; Refill: 1  11. Primary insomnia Bedtime routine  12. Osteopenia, senile - raloxifene (EVISTA) 60 MG tablet; Take 1 tablet (60 mg total) by mouth daily.  Dispense: 90 tablet; Refill: 1  13. BMI 26.0-26.9,adult Discussed diet and exercise for person with BMI >25 Will recheck weight in 3-6 months   14. Chronic midline low back pain without sciatica  - traMADol (ULTRAM) 50 MG tablet; Take 1 tablet (50 mg total) by mouth every 8 (eight) hours as needed.  Dispense: 90 tablet; Refill: 1   Labs pending Health Maintenance reviewed Diet and exercise encouraged  Follow up plan: 6 months   Mary-Margaret Hassell Done, FNP

## 2021-04-02 NOTE — Patient Instructions (Signed)

## 2021-04-02 NOTE — Telephone Encounter (Signed)
Confirmed with pharmacist that [name] did increase patient's Benazepril dosage from 20 mg to 40 mg at her visit today.

## 2021-04-03 ENCOUNTER — Other Ambulatory Visit: Payer: Self-pay | Admitting: Nurse Practitioner

## 2021-04-03 DIAGNOSIS — Z1231 Encounter for screening mammogram for malignant neoplasm of breast: Secondary | ICD-10-CM

## 2021-04-03 LAB — CBC WITH DIFFERENTIAL/PLATELET
Basophils Absolute: 0 10*3/uL (ref 0.0–0.2)
Basos: 0 %
EOS (ABSOLUTE): 0.1 10*3/uL (ref 0.0–0.4)
Eos: 2 %
Hematocrit: 37.2 % (ref 34.0–46.6)
Hemoglobin: 12.3 g/dL (ref 11.1–15.9)
Immature Grans (Abs): 0 10*3/uL (ref 0.0–0.1)
Immature Granulocytes: 0 %
Lymphocytes Absolute: 2.4 10*3/uL (ref 0.7–3.1)
Lymphs: 39 %
MCH: 31.8 pg (ref 26.6–33.0)
MCHC: 33.1 g/dL (ref 31.5–35.7)
MCV: 96 fL (ref 79–97)
Monocytes Absolute: 0.5 10*3/uL (ref 0.1–0.9)
Monocytes: 8 %
Neutrophils Absolute: 3.1 10*3/uL (ref 1.4–7.0)
Neutrophils: 51 %
Platelets: 254 10*3/uL (ref 150–450)
RBC: 3.87 x10E6/uL (ref 3.77–5.28)
RDW: 12.1 % (ref 11.7–15.4)
WBC: 6.1 10*3/uL (ref 3.4–10.8)

## 2021-04-03 LAB — CMP14+EGFR
ALT: 16 IU/L (ref 0–32)
AST: 22 IU/L (ref 0–40)
Albumin/Globulin Ratio: 1.7 (ref 1.2–2.2)
Albumin: 4.5 g/dL (ref 3.6–4.6)
Alkaline Phosphatase: 91 IU/L (ref 44–121)
BUN/Creatinine Ratio: 19 (ref 12–28)
BUN: 20 mg/dL (ref 8–27)
Bilirubin Total: 0.3 mg/dL (ref 0.0–1.2)
CO2: 26 mmol/L (ref 20–29)
Calcium: 9.3 mg/dL (ref 8.7–10.3)
Chloride: 97 mmol/L (ref 96–106)
Creatinine, Ser: 1.04 mg/dL — ABNORMAL HIGH (ref 0.57–1.00)
Globulin, Total: 2.6 g/dL (ref 1.5–4.5)
Glucose: 116 mg/dL — ABNORMAL HIGH (ref 65–99)
Potassium: 4.4 mmol/L (ref 3.5–5.2)
Sodium: 137 mmol/L (ref 134–144)
Total Protein: 7.1 g/dL (ref 6.0–8.5)
eGFR: 52 mL/min/{1.73_m2} — ABNORMAL LOW (ref >59)

## 2021-04-03 LAB — LIPID PANEL
Chol/HDL Ratio: 2.1 ratio (ref 0.0–4.4)
Cholesterol, Total: 154 mg/dL (ref 100–199)
HDL: 72 mg/dL (ref >39)
LDL Chol Calc (NIH): 62 mg/dL (ref 0–99)
Triglycerides: 111 mg/dL (ref 0–149)
VLDL Cholesterol Cal: 20 mg/dL (ref 5–40)

## 2021-04-04 ENCOUNTER — Ambulatory Visit: Payer: Medicare Other | Admitting: Specialist

## 2021-04-04 ENCOUNTER — Other Ambulatory Visit: Payer: Self-pay

## 2021-04-04 VITALS — BP 173/82 | Ht 66.0 in | Wt 140.0 lb

## 2021-04-04 DIAGNOSIS — M1611 Unilateral primary osteoarthritis, right hip: Secondary | ICD-10-CM

## 2021-04-04 DIAGNOSIS — M2170 Unequal limb length (acquired), unspecified site: Secondary | ICD-10-CM

## 2021-04-04 DIAGNOSIS — M25552 Pain in left hip: Secondary | ICD-10-CM

## 2021-04-04 NOTE — Patient Instructions (Signed)
You have a leg length discrepancy and need a left shoe lift to equalize the legs and take abnormal stress off the left leg and the left side of your body.  Rx for left shoe lift 3/4 inch, recommend [facility] [phone].  Also with arthritis that is mild and tylenol or advil as needed for pain.

## 2021-04-04 NOTE — Progress Notes (Addendum)
Office Visit Note   Patient: Katie [name]           Date of Birth: 1932/07/02           MRN: [id number] Visit Date: 04/04/2021              Requested by: [name], [address] [place],  [place] [address] PCP: [name], FNP   Assessment & Plan: Visit Diagnoses:  1. Acquired leg length discrepancy   2. Pain in left hip   3. Unilateral primary osteoarthritis, right hip     Plan: You have a leg length discrepancy and need a left shoe lift to equalize the legs and take abnormal stress off the left leg and the left side of your body.  Rx for left shoe lift 3/4 inch, recommend [facility] [phone].  Also with arthritis that is mild and tylenol or advil as needed for pain.  Follow-Up Instructions: Return in about 2 months (around 06/04/2021).   Orders:  No orders of the defined types were placed in this encounter.  No orders of the defined types were placed in this encounter.     Procedures: No procedures performed   Clinical Data: No additional findings.   Subjective: Chief Complaint  Patient presents with  . Left Hip - Follow-up    87 year old female post left intertrochanteric hip fracture ORIF with IM nail with proximal compression screw and antirotation screw and distal interlocking screws.Underwent CT scan of the left hip due to persistent left hip pain laterally and anteriorly. Pain is present with lying on the left side and walking. She uses a cane.   Review of Systems  Constitutional: Negative.   HENT: Negative.    Eyes: Negative.   Respiratory: Negative.    Cardiovascular: Negative.   Gastrointestinal: Negative.   Endocrine: Negative.   Genitourinary: Negative.   Musculoskeletal: Negative.   Skin: Negative.   Allergic/Immunologic: Negative.   Neurological: Negative.   Hematological: Negative.   Psychiatric/Behavioral: Negative.      Objective: Vital Signs: BP (!) 173/82   Physical  Exam Constitutional:      Appearance: She is well-developed.  HENT:     Head: Normocephalic and atraumatic.  Eyes:     Pupils: Pupils are equal, round, and reactive to light.  Pulmonary:     Effort: Pulmonary effort is normal.     Breath sounds: Normal breath sounds.  Abdominal:     General: Bowel sounds are normal.     Palpations: Abdomen is soft.  Musculoskeletal:        General: Normal range of motion.     Cervical back: Normal range of motion and neck supple.  Skin:    General: Skin is warm and dry.  Neurological:     Mental Status: She is alert and oriented to person, place, and time.  Psychiatric:        Behavior: Behavior normal.        Thought Content: Thought content normal.        Judgment: Judgment normal.   Left Hip Exam   Tenderness  The patient is experiencing tenderness in the lateral and greater trochanter.  Range of Motion  Abduction:  normal  Adduction:  normal  Extension:  normal  Flexion:  normal  External rotation:  normal  Internal rotation: normal   Muscle Strength  Abduction: 5/5  Adduction: 5/5  Flexion: 5/5   Tests  FABER: negative Ober: positive  Other  Scars: present    Specialty Comments:  No specialty comments available.  Imaging: No results found.   PMFS History: Patient Active Problem List   Diagnosis Date Noted  . Displaced intertrochanteric fracture of left femur, initial encounter for closed fracture ([place] [facility]) 09/22/2019  . Regurgitation of food 07/20/2019  . Chronic midline low back pain without sciatica 10/22/2018  . Primary insomnia 10/22/2018  . Persistent atrial fibrillation ([place]) 04/27/2017  . Chronic diastolic CHF (congestive heart failure) ([facility]) 04/27/2017  . CKD (chronic kidney disease) stage 3, GFR 30-59 ml/min (HCC) 05/14/2015  . BMI 26.0-26.9,adult 05/11/2015  . Peripheral edema 07/06/2014  . Hypokalemia 07/22/2013  . Hypothyroidism 07/22/2013  . Osteopenia, senile 03/09/2013  . Constipation  01/04/2013  . Hypertension 05/07/2012  . Hyperlipidemia 05/07/2012  . Diabetes mellitus type 2, diet-controlled ([place]) 05/07/2012  . GERD (gastroesophageal reflux disease) 05/07/2012   Past Medical History:  Diagnosis Date  . Anxiety   . Aortic insufficiency    a. Trivial AI by echo 02/2016.  [name] Atrial fibrillation and flutter ([place])    a. Coarse afib vs flutter by EKG 12/2015.  [name] Cancer ([facility])    skin cancer  . Cataract   . Chronic diastolic CHF (congestive heart failure) ([place])   . CKD (chronic kidney disease), stage III ([facility])   . COVID-19   . GERD (gastroesophageal reflux disease)   . Hiatal hernia   . Hypercholesterolemia   . Hypertension   . Hypokalemia   . Hypothyroidism   . Left knee pain   . NIDDM (non-insulin dependent diabetes mellitus)    diet controlled   . Osteoporosis   . Premature atrial contractions   . PVC's (premature ventricular contractions)   . Vitamin D deficiency     Family History  Problem Relation Age of Onset  . Diabetes Mother   . Stroke Mother   . Heart disease Father   . Stroke Father   . Uterine cancer Sister   . Diabetes Sister   . Ovarian cancer Sister   . Colon cancer Sister   . Diabetes Sister   . Liver cancer Sister        \  . Diabetes Brother   . Dementia Brother   . Atrial fibrillation Sister   . Diabetes Sister   . Diabetes Son   . Healthy Son   . Heart attack Neg Hx   . Hypertension Neg Hx   . Esophageal cancer Neg Hx   . Rectal cancer Neg Hx   . Stomach cancer Neg Hx     Past Surgical History:  Procedure Laterality Date  . A-FLUTTER ABLATION N/A 02/08/2019   Procedure: A-FLUTTER ABLATION;  Surgeon: Thompson Grayer, MD;  Location: [facility];  Service: Cardiovascular;  Laterality: N/A;  . ABDOMINAL HYSTERECTOMY    . APPENDECTOMY  1980  . BACK SURGERY    . CATARACT EXTRACTION, BILATERAL    . CHOLECYSTECTOMY  5/00  . COLONOSCOPY    . implantable loop recorder placement  04/06/2019   MDT Reveal LINQ [id number]  [id number]) implanted for evaluation of palpitations and afib post atrial flutter ablation by Dr Rayann Heman in office  . INTRAMEDULLARY (IM) NAIL INTERTROCHANTERIC Left 09/23/2019   Procedure: INTRAMEDULLARY (IM) NAIL INTERTROCHANTRIC;  Surgeon: Shona Needles, MD;  Location: [facility];  Service: Orthopedics;  Laterality: Left;  . TONSILLECTOMY    . TOTAL ABDOMINAL HYSTERECTOMY W/ BILATERAL SALPINGOOPHORECTOMY  1980  . UPPER GASTROINTESTINAL ENDOSCOPY     Social History  Occupational History  . Occupation: Retired    Comment: Teacher, adult education , golf, farm   Tobacco Use  . Smoking status: Never  . Smokeless tobacco: Never  Vaping Use  . Vaping Use: Never used  Substance and Sexual Activity  . Alcohol use: No  . Drug use: No  . Sexual activity: Not Currently

## 2021-04-15 ENCOUNTER — Other Ambulatory Visit: Payer: Self-pay | Admitting: Nurse Practitioner

## 2021-04-15 DIAGNOSIS — M545 Low back pain, unspecified: Secondary | ICD-10-CM

## 2021-04-19 NOTE — Progress Notes (Signed)
Carelink Summary Report / Loop Recorder 

## 2021-05-01 LAB — CUP PACEART REMOTE DEVICE CHECK
Date Time Interrogation Session: 20220910014327
Implantable Pulse Generator Implant Date: 20200819

## 2021-05-06 ENCOUNTER — Ambulatory Visit (INDEPENDENT_AMBULATORY_CARE_PROVIDER_SITE_OTHER): Payer: Medicare Other

## 2021-05-06 DIAGNOSIS — I495 Sick sinus syndrome: Secondary | ICD-10-CM | POA: Diagnosis not present

## 2021-05-13 NOTE — Progress Notes (Signed)
Carelink Summary Report / Loop Recorder 

## 2021-05-15 ENCOUNTER — Other Ambulatory Visit: Payer: Self-pay

## 2021-05-15 ENCOUNTER — Ambulatory Visit
Admission: RE | Admit: 2021-05-15 | Discharge: 2021-05-15 | Disposition: A | Discharge status: Home / Self Care | Payer: Medicare Other | Source: Ambulatory Visit | Attending: Nurse Practitioner | Admitting: Nurse Practitioner

## 2021-05-15 DIAGNOSIS — Z1231 Encounter for screening mammogram for malignant neoplasm of breast: Secondary | ICD-10-CM

## 2021-05-29 ENCOUNTER — Encounter: Payer: Self-pay | Admitting: Family Medicine

## 2021-05-29 ENCOUNTER — Ambulatory Visit (INDEPENDENT_AMBULATORY_CARE_PROVIDER_SITE_OTHER): Payer: Medicare Other | Admitting: Family Medicine

## 2021-05-29 ENCOUNTER — Other Ambulatory Visit: Payer: Self-pay

## 2021-05-29 VITALS — BP 181/72 | HR 67 | Temp 98.0°F | Ht 66.0 in | Wt 144.1 lb

## 2021-05-29 DIAGNOSIS — I10 Essential (primary) hypertension: Secondary | ICD-10-CM

## 2021-05-29 DIAGNOSIS — H6123 Impacted cerumen, bilateral: Secondary | ICD-10-CM

## 2021-05-29 NOTE — Patient Instructions (Signed)

## 2021-05-29 NOTE — Progress Notes (Signed)
Acute Office Visit  Subjective:    Patient ID: Katie [name], female    DOB: Jan 06, 1932, 87 y.o.   MRN: [id number]  Chief Complaint  Patient presents with   Cerumen Impaction    HPI Patient is in today for ear wax buildup. She went to see the audiologist yesterday for testing but was unable to to complete the testing due to wax build up. She denies pain, fever, or drainage.   Her BP is elevated today. She does not check her BP at home. She reports that her BP is normal elevated. She has been working with her PCP and cardiology to get this under control. She has an upcoming appointment with cardiology. She denies chest pain, dyspnea, edema, orthopnea, visual changes, focal weakness, dizziness, or lightheadedness.   Past Medical History:  Diagnosis Date   Anxiety    Aortic insufficiency    a. Trivial AI by echo 02/2016.   Atrial fibrillation and flutter ([facility])    a. Coarse afib vs flutter by EKG 12/2015.   Cancer ([facility])    skin cancer   Cataract    Chronic diastolic CHF (congestive heart failure) (HCC)    CKD (chronic kidney disease), stage III ([place])    COVID-19    GERD (gastroesophageal reflux disease)    Hiatal hernia    Hypercholesterolemia    Hypertension    Hypokalemia    Hypothyroidism    Left knee pain    NIDDM (non-insulin dependent diabetes mellitus)    diet controlled    Osteoporosis    Premature atrial contractions    PVC's (premature ventricular contractions)    Vitamin D deficiency     Past Surgical History:  Procedure Laterality Date   A-FLUTTER ABLATION N/A 02/08/2019   Procedure: A-FLUTTER ABLATION;  Surgeon: Thompson Grayer, MD;  Location: [facility];  Service: Cardiovascular;  Laterality: N/A;   ABDOMINAL HYSTERECTOMY     APPENDECTOMY  1980   BACK SURGERY     CATARACT EXTRACTION, BILATERAL     CHOLECYSTECTOMY  5/00   COLONOSCOPY     implantable loop recorder placement  04/06/2019   MDT Reveal LINQ [id number] ([id number]) implanted for evaluation of  palpitations and afib post atrial flutter ablation by Dr Rayann Heman in office   INTRAMEDULLARY (IM) NAIL INTERTROCHANTERIC Left 09/23/2019   Procedure: INTRAMEDULLARY (IM) NAIL INTERTROCHANTRIC;  Surgeon: Shona Needles, MD;  Location: [facility];  Service: Orthopedics;  Laterality: Left;   TONSILLECTOMY     TOTAL ABDOMINAL HYSTERECTOMY W/ BILATERAL SALPINGOOPHORECTOMY  1980   UPPER GASTROINTESTINAL ENDOSCOPY      Family History  Problem Relation Age of Onset   Diabetes Mother    Stroke Mother    Heart disease Father    Stroke Father    Uterine cancer Sister    Diabetes Sister    Ovarian cancer Sister    Colon cancer Sister    Diabetes Sister    Liver cancer Sister        _0   Diabetes Brother    Dementia Brother    Atrial fibrillation Sister    Diabetes Sister    Diabetes Son    Healthy Son    Heart attack Neg Hx    Hypertension Neg Hx    Esophageal cancer Neg Hx    Rectal cancer Neg Hx    Stomach cancer Neg Hx     Social History   Socioeconomic History   Marital status: Widowed    Spouse name: Not  on file   Number of children: 2   Years of education: Not on file   Highest education level: High school graduate  Occupational History   Occupation: Retired    Comment: Teacher, adult education , golf, farm   Tobacco Use   Smoking status: Never   Smokeless tobacco: Never  Vaping Use   Vaping Use: Never used  Substance and Sexual Activity   Alcohol use: No   Drug use: No   Sexual activity: Not Currently  Other Topics Concern   Not on file  Social History Narrative   Patient is widowed she has 2 children she used to work in Emergency planning/management officer Determinants of Radio broadcast assistant Strain: Not on file  Food Insecurity: Not on file  Transportation Needs: Not on file  Physical Activity: Not on file  Stress: Not on file  Social Connections: Not on file  Intimate Partner Violence: Not on file    Outpatient Medications Prior to Visit  Medication Sig Dispense Refill    Alum & Mag Hydroxide-Simeth (GI COCKTAIL) SUSP suspension Take 30 mLs by mouth at bedtime. Shake well. 900 mL 2   benazepril (LOTENSIN) 40 MG tablet Take 1 tablet (40 mg total) by mouth daily. 90 tablet 1   Cholecalciferol (VITAMIN D-3) 125 MCG (5000 UT) TABS Take 5,000 Units by mouth daily. 90 tablet 1   cloNIDine (CATAPRES) 0.1 MG tablet Take 1 tablet (0.1 mg total) by mouth at bedtime. 180 tablet 1   famotidine (PEPCID) 20 MG tablet Take 1 tablet (20 mg total) by mouth at bedtime. 90 tablet 1   feeding supplement, ENSURE ENLIVE, (ENSURE ENLIVE) LIQD Take 237 mLs by mouth 2 (two) times daily between meals.     fluticasone (FLONASE) 50 MCG/ACT nasal spray Place 2 sprays into both nostrils daily. 16 g 6   furosemide (LASIX) 40 MG tablet Take 1 tablet (40 mg total) by mouth daily. 90 tablet 1   levothyroxine (SYNTHROID) 75 MCG tablet Take 1 tablet (75 mcg total) by mouth daily. 90 tablet 1   lovastatin (MEVACOR) 40 MG tablet Take 1 tablet (40 mg total) by mouth at bedtime. 90 tablet 1   Multiple Vitamin (MULTIVITAMIN WITH MINERALS) TABS tablet Take 1 tablet by mouth daily.     pantoprazole (PROTONIX) 40 MG tablet Take 1 tablet (40 mg total) by mouth daily. 90 tablet 1   potassium chloride SA (KLOR-CON) 20 MEQ tablet Take 1 tablet (20 mEq total) by mouth daily. 90 tablet 1   raloxifene (EVISTA) 60 MG tablet Take 1 tablet (60 mg total) by mouth daily. 90 tablet 1   Rivaroxaban (XARELTO) 15 MG TABS tablet Take 1 tablet (15 mg total) by mouth daily with supper. 90 tablet 1   traMADol (ULTRAM) 50 MG tablet Take 1 tablet (50 mg total) by mouth every 8 (eight) hours as needed. 90 tablet 1   No facility-administered medications prior to visit.    Allergies  Allergen Reactions   Amlodipine Swelling   Macrodantin Nausea And Vomiting   Metformin And Related Nausea And Vomiting and Other (See Comments)    Bloating   Penicillins Rash    Did it involve swelling of the face/tongue/throat, SOB, or low  BP? Unknown Did it involve sudden or severe rash/hives, skin peeling, or any reaction on the inside of your mouth or nose? Yes Did you need to seek medical attention at a hospital or doctor's office? Yes When did it last happen? 2005  If all above answers are "NO", may proceed with cephalosporin use.     Review of Systems As per HPI.     Objective:    Physical Exam Vitals and nursing note reviewed.  Constitutional:      General: She is not in acute distress.    Appearance: She is not ill-appearing, toxic-appearing or diaphoretic.  HENT:     Right Ear: Ear canal and external ear normal. There is impacted cerumen.     Left Ear: Ear canal and external ear normal. There is impacted cerumen.  Eyes:     Conjunctiva/sclera: Conjunctivae normal.     Pupils: Pupils are equal, round, and reactive to light.  Cardiovascular:     Rate and Rhythm: Normal rate and regular rhythm.     Heart sounds: Normal heart sounds. No murmur heard. Pulmonary:     Effort: Pulmonary effort is normal. No respiratory distress.     Breath sounds: Normal breath sounds.  Musculoskeletal:     Right lower leg: No edema.     Left lower leg: No edema.  Skin:    General: Skin is warm and dry.  Neurological:     General: No focal deficit present.     Mental Status: She is alert and oriented to person, place, and time.  Psychiatric:        Mood and Affect: Mood normal.        Behavior: Behavior normal.    BP (!) 181/72   Pulse 67   Temp 98 F (36.7 C) (Temporal)   Ht 5' 6" (1.676 m)   Wt 144 lb 2 oz (65.4 kg)   BMI 23.26 kg/m  Wt Readings from Last 3 Encounters:  05/29/21 144 lb 2 oz (65.4 kg)  04/04/21 140 lb (63.5 kg)  04/02/21 140 lb (63.5 kg)   Ear Cerumen Removal  Date/Time: 05/29/2021 3:12 PM Performed by: Gwenlyn Perking, FNP Authorized by: Gwenlyn Perking, FNP   Anesthesia: Local Anesthetic: none Location details: right ear and left ear Patient tolerance: patient tolerated the  procedure well with no immediate complications Comments: Normal TMs visualized bilaterally following procedure.  Procedure type: irrigation  Sedation: Patient sedated: no    Health Maintenance Due  Topic Date Due   COVID-19 Vaccine (3 - Mixed Product risk series) 05/30/2020   INFLUENZA VACCINE  03/18/2021    There are no preventive care reminders to display for this patient.   Lab Results  Component Value Date   TSH 5.480 (H) 10/02/2020   Lab Results  Component Value Date   WBC 6.1 04/02/2021   HGB 12.3 04/02/2021   HCT 37.2 04/02/2021   MCV 96 04/02/2021   PLT 254 04/02/2021   Lab Results  Component Value Date   NA 137 04/02/2021   K 4.4 04/02/2021   CO2 26 04/02/2021   GLUCOSE 116 (H) 04/02/2021   BUN 20 04/02/2021   CREATININE 1.04 (H) 04/02/2021   BILITOT 0.3 04/02/2021   ALKPHOS 91 04/02/2021   AST 22 04/02/2021   ALT 16 04/02/2021   PROT 7.1 04/02/2021   ALBUMIN 4.5 04/02/2021   CALCIUM 9.3 04/02/2021   ANIONGAP 7 09/27/2019   EGFR 52 (L) 04/02/2021   Lab Results  Component Value Date   CHOL 154 04/02/2021   Lab Results  Component Value Date   HDL 72 04/02/2021   Lab Results  Component Value Date   LDLCALC 62 04/02/2021   Lab Results  Component Value Date   TRIG  111 04/02/2021   Lab Results  Component Value Date   CHOLHDL 2.1 04/02/2021   Lab Results  Component Value Date   HGBA1C 6.0 04/02/2021       Assessment & Plan:   Charleigh was seen today for cerumen impaction.  Diagnoses and all orders for this visit:  Bilateral impacted cerumen Irrigation today bilaterally. Normal TMs visualized bilaterally.  -     Ear Cerumen Removal  Primary hypertension Uncontrolled. Asymptomatic, benign exam today. Follow up with cardiology next week as scheduled. Check BP at home if able and notify of persist elevated readings.   Return to office for new or worsening symptoms, or if symptoms persist.   The patient indicates understanding of these  issues and agrees with the plan.   Gwenlyn Perking, FNP

## 2021-06-03 ENCOUNTER — Ambulatory Visit (INDEPENDENT_AMBULATORY_CARE_PROVIDER_SITE_OTHER): Payer: Medicare Other

## 2021-06-03 DIAGNOSIS — I495 Sick sinus syndrome: Secondary | ICD-10-CM

## 2021-06-04 LAB — CUP PACEART REMOTE DEVICE CHECK
Date Time Interrogation Session: 20221013014309
Implantable Pulse Generator Implant Date: 20200819

## 2021-06-06 ENCOUNTER — Encounter: Payer: Self-pay | Admitting: Specialist

## 2021-06-06 ENCOUNTER — Ambulatory Visit: Payer: Medicare Other | Admitting: Nurse Practitioner

## 2021-06-06 ENCOUNTER — Other Ambulatory Visit: Payer: Self-pay

## 2021-06-06 ENCOUNTER — Ambulatory Visit: Payer: Medicare Other | Admitting: Specialist

## 2021-06-06 VITALS — BP 209/108 | HR 86 | Ht 66.0 in | Wt 144.1 lb

## 2021-06-06 DIAGNOSIS — M2170 Unequal limb length (acquired), unspecified site: Secondary | ICD-10-CM | POA: Diagnosis not present

## 2021-06-06 DIAGNOSIS — M7062 Trochanteric bursitis, left hip: Secondary | ICD-10-CM

## 2021-06-06 DIAGNOSIS — M25552 Pain in left hip: Secondary | ICD-10-CM | POA: Diagnosis not present

## 2021-06-06 DIAGNOSIS — S72142G Displaced intertrochanteric fracture of left femur, subsequent encounter for closed fracture with delayed healing: Secondary | ICD-10-CM | POA: Diagnosis not present

## 2021-06-06 NOTE — Progress Notes (Signed)
Office Visit Note   Patient: Katie [name]           Date of Birth: 1932/06/16           MRN: [id number] Visit Date: 06/06/2021              Requested by: [name], [address] [place],  [place] [address] PCP: [name], FNP   Assessment & Plan: Visit Diagnoses:  1. Pain in left hip   2. Closed intertrochanteric fracture of left hip, with delayed healing, subsequent encounter   3. Acquired leg length discrepancy     Plan: Avoid bending, stooping and avoid lifting weights greater than 10 lbs. Avoid prolong standing and walking. Avoid frequent bending and stooping  No lifting greater than 10 lbs. May use ice or moist heat for pain. Weight loss is of benefit. Handicap license is approved. Dr. Romona Curls secretary/Assistant will call to arrange for hip greater trochanteric bursa steroid injection    Follow-Up Instructions: No follow-ups on file.   Orders:  No orders of the defined types were placed in this encounter.  No orders of the defined types were placed in this encounter.     Procedures: No procedures performed   Clinical Data: No additional findings.   Subjective: Chief Complaint  Patient presents with   Right Hip - Pain, Follow-up   Left Hip - Pain, Follow-up    87 year old female with history of left hip fracture almost 2 years ago with a reverse obliquity intertrochanteric hip fracture post IM nail with interlocking screws. She is seen with left leg shoe lift and persistent pain with lying on either side hip. Pain is present with pressure applied over the trochanter. CT scan shows solid healing of a medial displaced left intertrochanteric hip fracture.   Review of Systems  Constitutional: Negative.   HENT: Negative.    Eyes: Negative.   Respiratory: Negative.    Cardiovascular: Negative.   Gastrointestinal: Negative.   Endocrine: Negative.   Genitourinary: Negative.   Musculoskeletal: Negative.   Skin: Negative.    Allergic/Immunologic: Negative.   Neurological: Negative.   Hematological: Negative.   Psychiatric/Behavioral: Negative.      Objective: Vital Signs: BP (!) 209/108 (BP Location: Right Arm)   Pulse 86   Ht 5\' 6"  (1.676 m)   Wt 144 lb 2.1 oz (65.4 kg)   BMI 23.26 kg/m   Physical Exam Constitutional:      Appearance: She is well-developed.  HENT:     Head: Normocephalic and atraumatic.  Eyes:     Pupils: Pupils are equal, round, and reactive to light.  Pulmonary:     Effort: Pulmonary effort is normal.     Breath sounds: Normal breath sounds.  Abdominal:     General: Bowel sounds are normal.     Palpations: Abdomen is soft.  Musculoskeletal:        General: Normal range of motion.     Cervical back: Normal range of motion and neck supple.  Skin:    General: Skin is warm and dry.  Neurological:     Mental Status: She is alert and oriented to person, place, and time.  Psychiatric:        Behavior: Behavior normal.        Thought Content: Thought content normal.        Judgment: Judgment normal.   Left Hip Exam   Tenderness  The patient is experiencing tenderness in the greater trochanter.  Range of Motion  Abduction:  normal  Adduction:  normal  Flexion:  normal  External rotation:  normal  Internal rotation: normal   Muscle Strength  Abduction: 5/5  Adduction: 5/5  Flexion: 5/5   Tests  FABER: positive Ober: positive  Other  Scars: present Sensation: normal Pulse: present  Comments:  Tender left greater trochanter    Specialty Comments:  No specialty comments available.  Imaging: No results found.   PMFS History: Patient Active Problem List   Diagnosis Date Noted   Displaced intertrochanteric fracture of left femur, initial encounter for closed fracture ([facility]) 09/22/2019   Regurgitation of food 07/20/2019   Chronic midline low back pain without sciatica 10/22/2018   Primary insomnia 10/22/2018   Persistent atrial fibrillation ([facility])  04/27/2017   Chronic diastolic CHF (congestive heart failure) ([facility]) 04/27/2017   CKD (chronic kidney disease) stage 3, GFR 30-59 ml/min (H[facility]) 05/14/2015   BMI 26.0-26.9,adult 05/11/2015   Peripheral edema 07/06/2014   Hypokalemia 07/22/2013   Hypothyroidism 07/22/2013   Osteopenia, senile 03/09/2013   Constipation 01/04/2013   Hypertension 05/07/2012   Hyperlipidemia 05/07/2012   Diabetes mellitus type 2, diet-controlled ([facility]) 05/07/2012   GERD (gastroesophageal reflux disease) 05/07/2012   Past Medical History:  Diagnosis Date   Anxiety    Aortic insufficiency    a. Trivial AI by echo 02/2016.   Atrial fibrillation and flutter ([facility])    a. Coarse afib vs flutter by EKG 12/2015.   Cancer ([facility])    skin cancer   Cataract    Chronic diastolic CHF (congestive heart failure) ([facility])    CKD (chronic kidney disease), stage III ([facility]CC)    COVID-19    GERD (gastroesophageal reflux disease)    Hiatal hernia    Hypercholesterolemia    Hypertension    Hypokalemia    Hypothyroidism    Left knee pain    NIDDM (non-insulin dependent diabetes mellitus)    diet controlled    Osteoporosis    Premature atrial contractions    PVC's (premature ventricular contractions)    Vitamin D deficiency     Family History  Problem Relation Age of Onset   Diabetes Mother    Stroke Mother    Heart disease Father    Stroke Father    Uterine cancer Sister    Diabetes Sister    Ovarian cancer Sister    Colon cancer Sister    Diabetes Sister    Liver cancer Sister        \   Diabetes Brother    Dementia Brother    Atrial fibrillation Sister    Diabetes Sister    Diabetes Son    Healthy Son    Heart attack Neg Hx    Hypertension Neg Hx    Esophageal cancer Neg Hx    Rectal cancer Neg Hx    Stomach cancer Neg Hx     Past Surgical History:  Procedure Laterality Date   A-FLUTTER ABLATION N/A 02/08/2019   Procedure: A-FLUTTER ABLATION;  Surgeon: Thompson Grayer, MD;  Location: [facility];   Service: Cardiovascular;  Laterality: N/A;   ABDOMINAL HYSTERECTOMY     APPENDECTOMY  1980   BACK SURGERY     CATARACT EXTRACTION, BILATERAL     CHOLECYSTECTOMY  5/00   COLONOSCOPY     implantable loop recorder placement  04/06/2019   MDT Reveal LINQ [id number] ([id number]) implanted for evaluation of palpitations and afib post atrial flutter ablation by Dr Rayann Heman in office  INTRAMEDULLARY (IM) NAIL INTERTROCHANTERIC Left 09/23/2019   Procedure: INTRAMEDULLARY (IM) NAIL INTERTROCHANTRIC;  Surgeon: Shona Needles, MD;  Location: [facility];  Service: Orthopedics;  Laterality: Left;   TONSILLECTOMY     TOTAL ABDOMINAL HYSTERECTOMY W/ BILATERAL SALPINGOOPHORECTOMY  1980   UPPER GASTROINTESTINAL ENDOSCOPY     Social History   Occupational History   Occupation: Retired    Comment: Teacher, adult education , golf, farm   Tobacco Use   Smoking status: Never   Smokeless tobacco: Never  Vaping Use   Vaping Use: Never used  Substance and Sexual Activity   Alcohol use: No   Drug use: No   Sexual activity: Not Currently

## 2021-06-06 NOTE — Patient Instructions (Signed)
Plan: Avoid bending, stooping and avoid lifting weights greater than 10 lbs. Avoid prolong standing and walking. Avoid frequent bending and stooping  No lifting greater than 10 lbs. May use ice or moist heat for pain. Weight loss is of benefit. Handicap license is approved. Dr. Romona Curls secretary/Assistant will call to arrange for hip greater trochanteric bursa steroid injection

## 2021-06-07 ENCOUNTER — Encounter: Payer: Self-pay | Admitting: Nurse Practitioner

## 2021-06-07 ENCOUNTER — Ambulatory Visit: Payer: Medicare Other | Admitting: Nurse Practitioner

## 2021-06-07 ENCOUNTER — Ambulatory Visit (INDEPENDENT_AMBULATORY_CARE_PROVIDER_SITE_OTHER): Payer: Medicare Other | Admitting: Nurse Practitioner

## 2021-06-07 VITALS — BP 172/78 | HR 72 | Temp 97.3°F | Resp 20 | Ht 66.0 in | Wt 141.0 lb

## 2021-06-07 DIAGNOSIS — I10 Essential (primary) hypertension: Secondary | ICD-10-CM | POA: Diagnosis not present

## 2021-06-07 NOTE — Progress Notes (Signed)
   Subjective:    Patient ID: Katie [name], female    DOB: Sep 20, 1931, 87 y.o.   MRN: [id number]   Chief Complaint: BP high at appointment yesterday   HPI Patient wen to see Dr. Louanne [name] yesterday and her blood pressure was 099 systolic. She does not check her blood pressure at home. She says she feels fine. Denies headaches or visual changes.she only takes her clonidine 1x a day rather tah bid BP Readings from Last 3 Encounters:  06/07/21 (!) 172/78  06/06/21 (!) 209/108  05/29/21 (!) 181/72        Review of Systems  Constitutional:  Negative for diaphoresis.  Eyes:  Negative for pain.  Respiratory:  Negative for shortness of breath.   Cardiovascular:  Negative for chest pain, palpitations and leg swelling.  Gastrointestinal:  Negative for abdominal pain.  Endocrine: Negative for polydipsia.  Skin:  Negative for rash.  Neurological:  Negative for dizziness, weakness and headaches.  Hematological:  Does not bruise/bleed easily.  All other systems reviewed and are negative.     Objective:   Physical Exam Vitals reviewed.  Constitutional:      Appearance: Normal appearance.  Cardiovascular:     Rate and Rhythm: Normal rate and regular rhythm.     Heart sounds: Normal heart sounds.  Pulmonary:     Breath sounds: Normal breath sounds.  Skin:    General: Skin is warm.  Neurological:     General: No focal deficit present.     Mental Status: She is alert.  Psychiatric:        Behavior: Behavior normal.        Thought Content: Thought content normal.   BP (!) 172/78   Pulse 72   Temp (!) 97.3 F (36.3 C) (Temporal)   Resp 20   Ht 5\' 6"  (1.676 m)   Wt 141 lb (64 kg)   BMI 22.76 kg/m         Assessment & Plan:   Katie [name] in today with chief complaint of BP high at appointment yesterday   1. Primary hypertension Take clonidine bid Get a blood pressure cuff for home and check blood pressure randomly daily Keep follow up appointment next week.    The  above assessment and management plan was discussed with the patient. The patient verbalized understanding of and has agreed to the management plan. Patient is aware to call the clinic if symptoms persist or worsen. Patient is aware when to return to the clinic for a follow-up visit. Patient educated on when it is appropriate to go to the emergency department.   Mary-Margaret Hassell Done, FNP

## 2021-06-07 NOTE — Patient Instructions (Signed)

## 2021-06-11 ENCOUNTER — Other Ambulatory Visit: Payer: Self-pay

## 2021-06-11 ENCOUNTER — Encounter: Payer: Self-pay | Admitting: Nurse Practitioner

## 2021-06-11 ENCOUNTER — Ambulatory Visit (INDEPENDENT_AMBULATORY_CARE_PROVIDER_SITE_OTHER): Payer: Medicare Other | Admitting: Nurse Practitioner

## 2021-06-11 ENCOUNTER — Telehealth: Payer: Self-pay | Admitting: Physical Medicine and Rehabilitation

## 2021-06-11 VITALS — BP 148/67 | HR 65 | Temp 98.6°F | Resp 20 | Ht 66.0 in | Wt 144.0 lb

## 2021-06-11 DIAGNOSIS — I10 Essential (primary) hypertension: Secondary | ICD-10-CM | POA: Diagnosis not present

## 2021-06-11 NOTE — Patient Instructions (Signed)

## 2021-06-11 NOTE — Telephone Encounter (Signed)
Pts son Louie Casa called on her behalf wanting to get an appt for her to get an inj set up.   [phone]

## 2021-06-11 NOTE — Telephone Encounter (Signed)
Patient's son called. Returning a call to [facility]

## 2021-06-11 NOTE — Progress Notes (Signed)
   Subjective:    Patient ID: Katie [name], female    DOB: April 12, 1932, 87 y.o.   MRN: [id number]    Chief Complaint: Hypertension   HPI Patient was seen on 06/07/21 with hypertension. BP was 172 /78 at appointment. At that time she was only taking her clonidine 1x daily. We increased her to clonidine BID. She was encouraged to get a blood pressure cuff and keep a check of it daily. She has not gotten a blood pressure cuff for home use yet.She denies headache or visual changes. She says he feels good today   Review of Systems  Constitutional:  Negative for fatigue.  Eyes:  Negative for visual disturbance.  Respiratory:  Negative for shortness of breath.   Cardiovascular:  Positive for chest pain and leg swelling.  Neurological:  Negative for dizziness and headaches.      Objective:   Physical Exam Vitals and nursing note reviewed.  Constitutional:      Appearance: Normal appearance.  Cardiovascular:     Rate and Rhythm: Regular rhythm.     Heart sounds: Normal heart sounds.  Pulmonary:     Effort: Pulmonary effort is normal.     Breath sounds: Normal breath sounds.  Skin:    General: Skin is warm.  Neurological:     General: No focal deficit present.     Mental Status: She is alert and oriented to person, place, and time.  Psychiatric:        Behavior: Behavior normal.     BP (!) 148/67   Pulse 65   Temp 98.6 F (37 C) (Temporal)   Resp 20   Ht 5\' 6"  (1.676 m)   Wt 144 lb (65.3 kg)   SpO2 98%   BMI 23.24 kg/m       Assessment & Plan:  Katie [name] in today with chief complaint of Hypertension   1. Primary hypertension Continue current meds Keep diary of blood pressure at home once get blood pressure cuff Do not add salt to diet. Keep follow up appointment    The above assessment and management plan was discussed with the patient. The patient verbalized understanding of and has agreed to the management plan. Patient is aware to call the clinic if  symptoms persist or worsen. Patient is aware when to return to the clinic for a follow-up visit. Patient educated on when it is appropriate to go to the emergency department.   Mary-Margaret Hassell Done, FNP

## 2021-06-12 NOTE — Progress Notes (Signed)
Carelink Summary Report / Loop Recorder 

## 2021-06-19 ENCOUNTER — Ambulatory Visit (INDEPENDENT_AMBULATORY_CARE_PROVIDER_SITE_OTHER): Payer: Medicare Other | Admitting: Physical Medicine and Rehabilitation

## 2021-06-19 ENCOUNTER — Other Ambulatory Visit: Payer: Self-pay

## 2021-06-19 ENCOUNTER — Ambulatory Visit: Payer: Self-pay

## 2021-06-19 ENCOUNTER — Encounter: Payer: Self-pay | Admitting: Physical Medicine and Rehabilitation

## 2021-06-19 DIAGNOSIS — M7062 Trochanteric bursitis, left hip: Secondary | ICD-10-CM | POA: Diagnosis not present

## 2021-06-19 NOTE — Progress Notes (Signed)
Pt state left hip pain. Pt state standing and walking makes the pain worse. Pt state she takes over the counter pain meds to help ease her pain.  Numeric Pain Rating Scale and Functional Assessment Average Pain 5   In the last MONTH (on 0-10 scale) has pain interfered with the following?  1. General activity like being  able to carry out your everyday physical activities such as walking, climbing stairs, carrying groceries, or moving a chair?  Rating(8)   -BT, +Dye Allergies.

## 2021-06-22 MED ORDER — BUPIVACAINE HCL 0.25 % IJ SOLN
4.0000 mL | INTRAMUSCULAR | Status: AC | PRN
  Administered 2021-06-19: 4 mL via INTRA_ARTICULAR

## 2021-06-22 MED ORDER — LIDOCAINE HCL 2 % IJ SOLN
4.0000 mL | INTRAMUSCULAR | Status: AC | PRN
Start: 2021-06-19 — End: 2021-06-19
  Administered 2021-06-19: 4 mL

## 2021-06-22 MED ORDER — TRIAMCINOLONE ACETONIDE 40 MG/ML IJ SUSP
60.0000 mg | INTRAMUSCULAR | Status: AC | PRN
  Administered 2021-06-19: 60 mg via INTRA_ARTICULAR

## 2021-06-22 NOTE — Progress Notes (Signed)
   Katie [name] - 87 y.o. female MRN [id number]  Date of birth: 09-17-1931  Office Visit Note: Visit Date: 06/19/2021 PCP: [name], FNP Referred by: Hassell Done, Mary-Margaret, *  Subjective: Chief Complaint  Patient presents with   Left Hip - Pain   HPI:  Katie [name] is a 87 y.o. female who comes in today at the request of Dr. Basil Dess for planned Left greater trochanteric bursa injection with fluoroscopic guidance.  The patient has failed conservative care including home exercise, medications, time and activity modification.  This injection will be diagnostic and hopefully therapeutic.  Please see requesting physician notes for further details and justification.   ROS Otherwise per HPI.  Assessment & Plan: Visit Diagnoses:    ICD-10-CM   1. Greater trochanteric bursitis, left  M70.62 XR C-ARM NO REPORT      Plan: No additional findings.   Meds & Orders: No orders of the defined types were placed in this encounter.   Orders Placed This Encounter  Procedures   Large Joint Inj   XR C-ARM NO REPORT    Follow-up: Return for visit to requesting physician as needed.   Procedures: Large Joint Inj: L greater trochanter on 06/19/2021 1:30 PM Indications: pain and diagnostic evaluation Details: 22 G 3.5 in needle, fluoroscopy-guided lateral approach  Arthrogram: No  Medications: 4 mL lidocaine 2 %; 4 mL bupivacaine 0.25 %; 60 mg triamcinolone acetonide 40 MG/ML Outcome: tolerated well, no immediate complications  There was excellent flow of contrast outlined the greater trochanteric bursa without vascular uptake. Procedure, treatment alternatives, risks and benefits explained, specific risks discussed. Consent was given by the patient. Immediately prior to procedure a time out was called to verify the correct patient, procedure, equipment, support staff and site/side marked as required. Patient was prepped and draped in the usual sterile fashion.         Clinical  History: No specialty comments available.     Objective:  VS:  HT:    WT:   BMI:     BP:   HR: bpm  TEMP: ( )  RESP:  Physical Exam Musculoskeletal:     Comments: Slow to rise from a seated position and is having difficulty walking with a left lower limb with prior history of hip fracture.  Pain over the left greater trochanter.     Imaging: No results found.

## 2021-07-02 ENCOUNTER — Telehealth: Payer: Self-pay | Admitting: Nurse Practitioner

## 2021-07-02 NOTE — Telephone Encounter (Signed)
Please take care oF. Thank you

## 2021-07-03 LAB — CUP PACEART REMOTE DEVICE CHECK
Date Time Interrogation Session: 20221115011637
Implantable Pulse Generator Implant Date: 20200819

## 2021-07-04 NOTE — Telephone Encounter (Signed)
Letter written and will hold until patients upcoming appt as requested

## 2021-07-08 ENCOUNTER — Ambulatory Visit (INDEPENDENT_AMBULATORY_CARE_PROVIDER_SITE_OTHER): Payer: Medicare Other

## 2021-07-08 DIAGNOSIS — I495 Sick sinus syndrome: Secondary | ICD-10-CM | POA: Diagnosis not present

## 2021-07-09 ENCOUNTER — Ambulatory Visit: Payer: Medicare Other

## 2021-07-09 ENCOUNTER — Telehealth: Payer: Self-pay

## 2021-07-09 ENCOUNTER — Other Ambulatory Visit: Payer: Self-pay | Admitting: Nurse Practitioner

## 2021-07-09 ENCOUNTER — Other Ambulatory Visit: Payer: Self-pay

## 2021-07-09 DIAGNOSIS — I10 Essential (primary) hypertension: Secondary | ICD-10-CM

## 2021-07-09 DIAGNOSIS — Z0130 Encounter for examination of blood pressure without abnormal findings: Secondary | ICD-10-CM

## 2021-07-09 MED ORDER — CLONIDINE HCL 0.1 MG PO TABS: 0.1000 mg | ORAL_TABLET | Freq: Two times a day (BID) | ORAL | 1 refills | Status: DC

## 2021-07-09 MED ORDER — CLONIDINE HCL 0.1 MG PO TABS: 0.2000 mg | ORAL_TABLET | Freq: Two times a day (BID) | ORAL | 1 refills | Status: DC

## 2021-07-09 NOTE — Telephone Encounter (Signed)
Patient aware and verbalized understanding. °

## 2021-07-09 NOTE — Telephone Encounter (Signed)
New prescription sent to pharmacy 

## 2021-07-09 NOTE — Telephone Encounter (Signed)
Patient's Clonidine was increased from one per day to two per day at last office visit.  She would like a new prescription sent to [facility].  She is running out since the increase and pharmacy cannot fill with current directions on medication.

## 2021-07-09 NOTE — Progress Notes (Signed)
Patient here today for blood pressure check and to check her home machine. Her blood pressure on our machine was 182/75, her machine was 161/66.  Her blood pressure was rechecked on our machine at 161/71.  Patient also brought in listing of home blood pressure readings which are being given to you and will be scanned into patient chart.

## 2021-07-17 NOTE — Progress Notes (Signed)
Carelink Summary Report / Loop Recorder 

## 2021-07-24 ENCOUNTER — Encounter: Payer: Self-pay | Admitting: Family Medicine

## 2021-07-24 ENCOUNTER — Ambulatory Visit (INDEPENDENT_AMBULATORY_CARE_PROVIDER_SITE_OTHER): Payer: Medicare Other | Admitting: Family Medicine

## 2021-07-24 ENCOUNTER — Other Ambulatory Visit: Payer: Self-pay

## 2021-07-24 VITALS — BP 178/82 | Temp 97.9°F | Ht 66.0 in | Wt 139.0 lb

## 2021-07-24 DIAGNOSIS — J0100 Acute maxillary sinusitis, unspecified: Secondary | ICD-10-CM | POA: Diagnosis not present

## 2021-07-24 MED ORDER — GUAIFENESIN ER 600 MG PO TB12: 600.0000 mg | ORAL_TABLET | Freq: Two times a day (BID) | ORAL | 0 refills | Status: AC

## 2021-07-24 MED ORDER — DOXYCYCLINE HYCLATE 100 MG PO TABS: 100.0000 mg | ORAL_TABLET | Freq: Two times a day (BID) | ORAL | 0 refills | Status: AC

## 2021-07-24 NOTE — Progress Notes (Signed)
Subjective:  Patient ID: Katie [name], female    DOB: [birth year], 87 y.o.   MRN: [id number]  Patient Care Team: [name], [place] as PCP - General (Nurse Practitioner) [name], MD as PCP - Cardiology (Cardiology) [name], MD as Consulting Physician (Cardiology) Renard Hamper Marguerita Merles (Physician Assistant) Jessy Oto, MD as Consulting Physician (Orthopedic Surgery) Melina Schools, OD (Optometry)   Chief Complaint:  Sinusitis (X2 weeks, progressively worse//Eyes burn/sting/Nose burns/stings/Sinus pressure)   HPI: Katie [name] is a 87 y.o. female presenting on 07/24/2021 for Sinusitis (X2 weeks, progressively worse//Eyes burn/sting/Nose burns/stings/Sinus pressure)   Reports ongoing maxillary sinus pressure with congestion and postnasal drainage for over 2 weeks. She has been taking Flonse without relief of symptoms.   Sinusitis This is a recurrent problem. The current episode started 1 to 4 weeks ago. The problem has been gradually worsening since onset. The pain is moderate. Associated symptoms include chills, congestion, coughing, headaches and sinus pressure. Pertinent negatives include no diaphoresis, ear pain, hoarse voice, neck pain, shortness of breath, sneezing, sore throat or swollen glands. Past treatments include spray decongestants. The treatment provided no relief.    Relevant past medical, surgical, family, and social history reviewed and updated as indicated.  Allergies and medications reviewed and updated. Data reviewed: Chart in Epic.   Past Medical History:  Diagnosis Date   Anxiety    Aortic insufficiency    a. Trivial AI by echo 02/2016.   Atrial fibrillation and flutter ([place])    a. Coarse afib vs flutter by EKG 12/2015.   Cancer ([place])    skin cancer   Cataract    Chronic diastolic CHF (congestive heart failure) (HCC)    CKD (chronic kidney disease), stage III ([place])    COVID-19    GERD (gastroesophageal reflux  disease)    Hiatal hernia    Hypercholesterolemia    Hypertension    Hypokalemia    Hypothyroidism    Left knee pain    NIDDM (non-insulin dependent diabetes mellitus)    diet controlled    Osteoporosis    Premature atrial contractions    PVC's (premature ventricular contractions)    Vitamin D deficiency     Past Surgical History:  Procedure Laterality Date   A-FLUTTER ABLATION N/A 02/08/2019   Procedure: A-FLUTTER ABLATION;  Surgeon: Thompson Grayer, MD;  Location: [facility];  Service: Cardiovascular;  Laterality: N/A;   ABDOMINAL HYSTERECTOMY     APPENDECTOMY  1980   BACK SURGERY     CATARACT EXTRACTION, BILATERAL     CHOLECYSTECTOMY  5/00   COLONOSCOPY     implantable loop recorder placement  04/06/2019   MDT Reveal LINQ [id number] ([id number]) implanted for evaluation of palpitations and afib post atrial flutter ablation by Dr Rayann Heman in office   INTRAMEDULLARY (IM) NAIL INTERTROCHANTERIC Left 09/23/2019   Procedure: INTRAMEDULLARY (IM) NAIL INTERTROCHANTRIC;  Surgeon: Shona Needles, MD;  Location: [facility];  Service: Orthopedics;  Laterality: Left;   TONSILLECTOMY     TOTAL ABDOMINAL HYSTERECTOMY W/ BILATERAL SALPINGOOPHORECTOMY  1980   UPPER GASTROINTESTINAL ENDOSCOPY      Social History   Socioeconomic History   Marital status: Widowed    Spouse name: Not on file   Number of children: 2   Years of education: Not on file   Highest education level: High school graduate  Occupational History   Occupation: Retired    Comment: Teacher, adult education , [place], farm  Tobacco Use   Smoking status: Never   Smokeless tobacco: Never  Vaping Use   Vaping Use: Never used  Substance and Sexual Activity   Alcohol use: No   Drug use: No   Sexual activity: Not Currently  Other Topics Concern   Not on file  Social History Narrative   Patient is widowed she has 2 children she used to work in [employer] fund[employer]   Social Determinants of Radio broadcast assistant Strain: Not on  file  Food Insecurity: Not on file  Transportation Needs: Not on file  Physical Activity: Not on file  Stress: Not on file  Social Connections: Not on file  Intimate Partner Violence: Not on file    Outpatient Encounter Medications as of 07/24/2021  Medication Sig   Alum & Mag Hydroxide-Simeth (GI COCKTAIL) SUSP suspension Take 30 mLs by mouth at bedtime. Shake well.   benazepril (LOTENSIN) 40 MG tablet Take 1 tablet (40 mg total) by mouth daily.   Cholecalciferol (VITAMIN D-3) 125 MCG (5000 UT) TABS Take 5,000 Units by mouth daily.   cloNIDine (CATAPRES) 0.1 MG tablet Take 1 tablet (0.1 mg total) by mouth 2 (two) times daily.   doxycycline (VIBRA-TABS) 100 MG tablet Take 1 tablet (100 mg total) by mouth 2 (two) times daily for 10 days. 1 po bid   famotidine (PEPCID) 20 MG tablet Take 1 tablet (20 mg total) by mouth at bedtime.   feeding supplement, ENSURE ENLIVE, (ENSURE ENLIVE) LIQD Take 237 mLs by mouth 2 (two) times daily between meals.   fluticasone (FLONASE) 50 MCG/ACT nasal spray Place 2 sprays into both nostrils daily.   furosemide (LASIX) 40 MG tablet Take 1 tablet (40 mg total) by mouth daily.   guaiFENesin (MUCINEX) 600 MG 12 hr tablet Take 1 tablet (600 mg total) by mouth 2 (two) times daily for 10 days.   levothyroxine (SYNTHROID) 75 MCG tablet Take 1 tablet (75 mcg total) by mouth daily.   lovastatin (MEVACOR) 40 MG tablet Take 1 tablet (40 mg total) by mouth at bedtime.   Multiple Vitamin (MULTIVITAMIN WITH MINERALS) TABS tablet Take 1 tablet by mouth daily.   pantoprazole (PROTONIX) 40 MG tablet Take 1 tablet (40 mg total) by mouth daily.   potassium chloride SA (KLOR-CON) 20 MEQ tablet Take 1 tablet (20 mEq total) by mouth daily.   raloxifene (EVISTA) 60 MG tablet Take 1 tablet (60 mg total) by mouth daily.   Rivaroxaban (XARELTO) 15 MG TABS tablet Take 1 tablet (15 mg total) by mouth daily with supper.   traMADol (ULTRAM) 50 MG tablet Take 1 tablet (50 mg total) by mouth  every 8 (eight) hours as needed.   No facility-administered encounter medications on file as of 07/24/2021.    Allergies  Allergen Reactions   Amlodipine Swelling   Macrodantin Nausea And Vomiting   Metformin And Related Nausea And Vomiting and Other (See Comments)    Bloating   Penicillins Rash    Did it involve swelling of the face/tongue/throat, SOB, or low BP? Unknown Did it involve sudden or severe rash/hives, skin peeling, or any reaction on the inside of your mouth or nose? Yes Did you need to seek medical attention at a hospital or doctor's office? Yes When did it last happen? 2005  If all above answers are "NO", may proceed with cephalosporin use.     Review of Systems  Constitutional:  Positive for chills. Negative for diaphoresis.  HENT:  Positive for congestion and sinus pressure.  Negative for ear pain, hoarse voice, sneezing and sore throat.   Respiratory:  Positive for cough. Negative for shortness of breath.   Musculoskeletal:  Negative for neck pain.  Neurological:  Positive for headaches.       Objective:  BP (!) 178/82   Temp 97.9 F (36.6 C)   Ht 5\' 6"  (1.676 m)   Wt 139 lb (63 kg)   SpO2 97%   BMI 22.44 kg/m    Wt Readings from Last 3 Encounters:  07/24/21 139 lb (63 kg)  06/11/21 144 lb (65.3 kg)  06/07/21 141 lb (64 kg)    Physical Exam Vitals and nursing note reviewed.  Constitutional:      General: She is not in acute distress.    Appearance: Normal appearance. She is well-developed, well-groomed and normal weight. She is not ill-appearing, toxic-appearing or diaphoretic.  HENT:     Head: Normocephalic and atraumatic.     Jaw: There is normal jaw occlusion.     Right Ear: Tympanic membrane, ear canal and external ear normal. No decreased hearing noted.     Left Ear: Tympanic membrane, ear canal and external ear normal. Decreased hearing noted.     Ears:     Comments: Bilateral hearing aids, removed for exam and pt placed in pocket.     Nose: Congestion present.     Right Turbinates: Enlarged.     Left Turbinates: Enlarged.     Right Sinus: Maxillary sinus tenderness present. No frontal sinus tenderness.     Left Sinus: Maxillary sinus tenderness present. No frontal sinus tenderness.     Mouth/Throat:     Lips: Pink.     Mouth: Mucous membranes are moist.     Pharynx: Oropharynx is clear. Uvula midline. Posterior oropharyngeal erythema present. No pharyngeal swelling, oropharyngeal exudate or uvula swelling.     Tonsils: No tonsillar exudate or tonsillar abscesses.     Comments: Postnasal drainage Eyes:     General: Lids are normal.     Extraocular Movements: Extraocular movements intact.     Conjunctiva/sclera: Conjunctivae normal.     Pupils: Pupils are equal, round, and reactive to light.  Neck:     Thyroid: No thyroid mass, thyromegaly or thyroid tenderness.     Vascular: No carotid bruit or JVD.     Trachea: Trachea and phonation normal.  Cardiovascular:     Rate and Rhythm: Normal rate and regular rhythm.     Chest Wall: PMI is not displaced.     Pulses: Normal pulses.     Heart sounds: Normal heart sounds. No murmur heard.   No friction rub. No gallop.  Pulmonary:     Effort: Pulmonary effort is normal. No respiratory distress.     Breath sounds: Normal breath sounds. No wheezing.  Abdominal:     General: Bowel sounds are normal. There is no distension or abdominal bruit.     Palpations: Abdomen is soft. There is no hepatomegaly or splenomegaly.     Tenderness: There is no abdominal tenderness. There is no right CVA tenderness or left CVA tenderness.     Hernia: No hernia is present.  Musculoskeletal:        General: Normal range of motion.     Cervical back: Normal range of motion and neck supple.     Right lower leg: No edema.     Left lower leg: No edema.  Lymphadenopathy:     Cervical: No cervical adenopathy.  Skin:  General: Skin is warm and dry.     Capillary Refill: Capillary refill takes  less than 2 seconds.     Coloration: Skin is not cyanotic, jaundiced or pale.     Findings: No rash.  Neurological:     General: No focal deficit present.     Mental Status: She is alert and oriented to person, place, and time.     Sensory: Sensation is intact.     Motor: Motor function is intact.     Coordination: Coordination is intact.     Gait: Gait is intact.     Deep Tendon Reflexes: Reflexes are normal and symmetric.  Psychiatric:        Attention and Perception: Attention and perception normal.        Mood and Affect: Mood and affect normal.        Speech: Speech normal.        Behavior: Behavior normal. Behavior is cooperative.        Thought Content: Thought content normal.        Cognition and Memory: Cognition and memory normal.        Judgment: Judgment normal.    Results for orders placed or performed in visit on 07/08/21  [facility]  Result Value Ref Range   Date Time Interrogation Session [id number]    Pulse Generator Manufacturer MERM    Pulse Gen Model IWL79 Reveal LINQ    Pulse Gen Serial Number [id number]    Clinic Name [facility]    Implantable Pulse Generator Type ICM/ILR    Implantable Pulse Generator Implant Date [id number]    Eval Rhythm SB at 46 bpm        Pertinent labs & imaging results that were available during my care of the patient were reviewed by me and considered in my medical decision making.  Assessment & Plan:  Najee was seen today for sinusitis.  Diagnoses and all orders for this visit:  Acute non-recurrent maxillary sinusitis Ongoing symptoms for over 2 weeks without relief using symptomatic care. Will add Mucinex and Doxycycline to regimen. Symptomatic care discussed in detail. Pt aware to report any new, worsening, or persistent symptoms.  -     doxycycline (VIBRA-TABS) 100 MG tablet; Take 1 tablet (100 mg total) by mouth 2 (two) times daily for 10 days. 1 po bid -     guaiFENesin (MUCINEX) 600 MG 12 hr  tablet; Take 1 tablet (600 mg total) by mouth 2 (two) times daily for 10 days.  Pt has not taken afternoon blood pressure medications and aware to take once she gets home.    Continue all other maintenance medications.  Follow up plan: Return if symptoms worsen or fail to improve.   Continue healthy lifestyle choices, including diet (rich in fruits, vegetables, and lean proteins, and low in salt and simple carbohydrates) and exercise (at least 30 minutes of moderate physical activity daily).  Educational handout given for sinusitis  The above assessment and management plan was discussed with the patient. The patient verbalized understanding of and has agreed to the management plan. Patient is aware to call the clinic if they develop any new symptoms or if symptoms persist or worsen. Patient is aware when to return to the clinic for a follow-up visit. Patient educated on when it is appropriate to go to the emergency department.   Monia Pouch, FNP-C [facility] [phone]

## 2021-08-08 LAB — CUP PACEART REMOTE DEVICE CHECK
Date Time Interrogation Session: 20221218011851
Implantable Pulse Generator Implant Date: 20200819

## 2021-08-13 ENCOUNTER — Ambulatory Visit (INDEPENDENT_AMBULATORY_CARE_PROVIDER_SITE_OTHER): Payer: Medicare Other

## 2021-08-13 DIAGNOSIS — I495 Sick sinus syndrome: Secondary | ICD-10-CM | POA: Diagnosis not present

## 2021-08-22 NOTE — Progress Notes (Signed)
Carelink Summary Report / Loop Recorder 

## 2021-08-30 ENCOUNTER — Ambulatory Visit (INDEPENDENT_AMBULATORY_CARE_PROVIDER_SITE_OTHER): Payer: Medicare Other

## 2021-08-30 VITALS — Ht 66.0 in | Wt 139.0 lb

## 2021-08-30 DIAGNOSIS — Z0000 Encounter for general adult medical examination without abnormal findings: Secondary | ICD-10-CM | POA: Diagnosis not present

## 2021-08-30 DIAGNOSIS — E119 Type 2 diabetes mellitus without complications: Secondary | ICD-10-CM | POA: Insufficient documentation

## 2021-08-30 NOTE — Patient Instructions (Signed)
Katie [name] , Thank you for taking time to come for your Medicare Wellness Visit. I appreciate your ongoing commitment to your health goals. Please review the following plan we discussed and let me know if I can assist you in the future.   Screening recommendations/referrals: Colonoscopy: Done 2013 - no repeat Mammogram: Done 05/15/2021 - Repeat annually  Bone Density: Done 09/22/2013 - no repeat required Recommended yearly ophthalmology/optometry visit for glaucoma screening and checkup Recommended yearly dental visit for hygiene and checkup  Vaccinations: Influenza vaccine: Done 05/07/2021 - Repeat annually  Pneumococcal vaccine: Done 02/07/2015 & 06/30/2012 Tdap vaccine: Done 12/01/2011 - Repeat in 10 years *due soon Shingles vaccine: Done 04/28/2017 & 07/16/2017   Covid-19: 03/30/2020, 10/13/221, 08/27/2020, & 01/30/2021  Advanced directives: Please bring a copy of your health care power of attorney and living will to the office to be added to your chart at your convenience.   Conditions/risks identified: Aim for 30 minutes of exercise or brisk walking each day, drink 6-8 glasses of water and eat lots of fruits and vegetables.   Next appointment: Follow up in one year for your annual wellness visit    Preventive Care 65 Years and Older, Female Preventive care refers to lifestyle choices and visits with your health care provider that can promote health and wellness. What does preventive care include? A yearly physical exam. This is also called an annual well check. Dental exams once or twice a year. Routine eye exams. Ask your health care provider how often you should have your eyes checked. Personal lifestyle choices, including: Daily care of your teeth and gums. Regular physical activity. Eating a healthy diet. Avoiding tobacco and drug use. Limiting alcohol use. Practicing safe sex. Taking low-dose aspirin every day. Taking vitamin and mineral supplements as recommended by your  health care provider. What happens during an annual well check? The services and screenings done by your health care provider during your annual well check will depend on your age, overall health, lifestyle risk factors, and family history of disease. Counseling  Your health care provider may ask you questions about your: Alcohol use. Tobacco use. Drug use. Emotional well-being. Home and relationship well-being. Sexual activity. Eating habits. History of falls. Memory and ability to understand (cognition). Work and work Statistician. Reproductive health. Screening  You may have the following tests or measurements: Height, weight, and BMI. Blood pressure. Lipid and cholesterol levels. These may be checked every 5 years, or more frequently if you are over 73 years old. Skin check. Lung cancer screening. You may have this screening every year starting at age 68 if you have a 30-pack-year history of smoking and currently smoke or have quit within the past 15 years. Fecal occult blood test (FOBT) of the stool. You may have this test every year starting at age 31. Flexible sigmoidoscopy or colonoscopy. You may have a sigmoidoscopy every 5 years or a colonoscopy every 10 years starting at age 17. Hepatitis C blood test. Hepatitis B blood test. Sexually transmitted disease (STD) testing. Diabetes screening. This is done by checking your blood sugar (glucose) after you have not eaten for a while (fasting). You may have this done every 1-3 years. Bone density scan. This is done to screen for osteoporosis. You may have this done starting at age 71. Mammogram. This may be done every 1-2 years. Talk to your health care provider about how often you should have regular mammograms. Talk with your health care provider about your test results, treatment options, and  if necessary, the need for more tests. Vaccines  Your health care provider may recommend certain vaccines, such as: Influenza vaccine.  This is recommended every year. Tetanus, diphtheria, and acellular pertussis (Tdap, Td) vaccine. You may need a Td booster every 10 years. Zoster vaccine. You may need this after age 16. Pneumococcal 13-valent conjugate (PCV13) vaccine. One dose is recommended after age 63. Pneumococcal polysaccharide (PPSV23) vaccine. One dose is recommended after age 67. Talk to your health care provider about which screenings and vaccines you need and how often you need them. This information is not intended to replace advice given to you by your health care provider. Make sure you discuss any questions you have with your health care provider. Document Released: 08/31/2015 Document Revised: 04/23/2016 Document Reviewed: 06/05/2015 Elsevier Interactive Patient Education  2017 [place] Prevention in the Home Falls can cause injuries. They can happen to people of all ages. There are many things you can do to make your home safe and to help prevent falls. What can I do on the outside of my home? Regularly fix the edges of walkways and driveways and fix any cracks. Remove anything that might make you trip as you walk through a door, such as a raised step or threshold. Trim any bushes or trees on the path to your home. Use bright outdoor lighting. Clear any walking paths of anything that might make someone trip, such as rocks or tools. Regularly check to see if handrails are loose or broken. Make sure that both sides of any steps have handrails. Any raised decks and porches should have guardrails on the edges. Have any leaves, snow, or ice cleared regularly. Use sand or salt on walking paths during winter. Clean up any spills in your garage right away. This includes oil or grease spills. What can I do in the bathroom? Use night lights. Install grab bars by the toilet and in the tub and shower. Do not use towel bars as grab bars. Use non-skid mats or decals in the tub or shower. If you need to sit  down in the shower, use a plastic, non-slip stool. Keep the floor dry. Clean up any water that spills on the floor as soon as it happens. Remove soap buildup in the tub or shower regularly. Attach bath mats securely with double-sided non-slip rug tape. Do not have throw rugs and other things on the floor that can make you trip. What can I do in the bedroom? Use night lights. Make sure that you have a light by your bed that is easy to reach. Do not use any sheets or blankets that are too big for your bed. They should not hang down onto the floor. Have a firm chair that has side arms. You can use this for support while you get dressed. Do not have throw rugs and other things on the floor that can make you trip. What can I do in the kitchen? Clean up any spills right away. Avoid walking on wet floors. Keep items that you use a lot in easy-to-reach places. If you need to reach something above you, use a strong step stool that has a grab bar. Keep electrical cords out of the way. Do not use floor polish or wax that makes floors slippery. If you must use wax, use non-skid floor wax. Do not have throw rugs and other things on the floor that can make you trip. What can I do with my stairs? Do not leave any  items on the stairs. Make sure that there are handrails on both sides of the stairs and use them. Fix handrails that are broken or loose. Make sure that handrails are as long as the stairways. Check any carpeting to make sure that it is firmly attached to the stairs. Fix any carpet that is loose or worn. Avoid having throw rugs at the top or bottom of the stairs. If you do have throw rugs, attach them to the floor with carpet tape. Make sure that you have a light switch at the top of the stairs and the bottom of the stairs. If you do not have them, ask someone to add them for you. What else can I do to help prevent falls? Wear shoes that: Do not have high heels. Have rubber bottoms. Are  comfortable and fit you well. Are closed at the toe. Do not wear sandals. If you use a stepladder: Make sure that it is fully opened. Do not climb a closed stepladder. Make sure that both sides of the stepladder are locked into place. Ask someone to hold it for you, if possible. Clearly mark and make sure that you can see: Any grab bars or handrails. First and last steps. Where the edge of each step is. Use tools that help you move around (mobility aids) if they are needed. These include: Canes. Walkers. Scooters. Crutches. Turn on the lights when you go into a dark area. Replace any light bulbs as soon as they burn out. Set up your furniture so you have a clear path. Avoid moving your furniture around. If any of your floors are uneven, fix them. If there are any pets around you, be aware of where they are. Review your medicines with your doctor. Some medicines can make you feel dizzy. This can increase your chance of falling. Ask your doctor what other things that you can do to help prevent falls. This information is not intended to replace advice given to you by your health care provider. Make sure you discuss any questions you have with your health care provider. Document Released: 05/31/2009 Document Revised: 01/10/2016 Document Reviewed: 09/08/2014 Elsevier Interactive Patient Education  2017 [employer].

## 2021-08-30 NOTE — Progress Notes (Signed)
Subjective:   Katie [name] is a 88 y.o. female who presents for Medicare Annual (Subsequent) preventive examination.  Virtual Visit via Telephone Note  I connected with  GYSELLE MATTHEW on 08/30/21 at  3:30 PM EST by telephone and verified that I am speaking with the correct person using two identifiers.  Location: Patient: Home Provider: [facility] Persons participating in the virtual visit: patient/Nurse Health Advisor   I discussed the limitations, risks, security and privacy concerns of performing an evaluation and management service by telephone and the availability of in person appointments. The patient expressed understanding and agreed to proceed.  Interactive audio and video telecommunications were attempted between this nurse and patient, however failed, due to patient having technical difficulties OR patient did not have access to video capability.  We continued and completed visit with audio only.  Some vital signs may be absent or patient reported.   Haivyn Oravec E Torben Soloway, LPN   Review of Systems     Cardiac Risk Factors include: advanced age (>36men, >48 women);diabetes mellitus;dyslipidemia;sedentary lifestyle;hypertension;Other (see comment), Risk factor comments: A.Fib     Objective:    Today's Vitals   08/30/21 1539  Weight: 139 lb (63 kg)  Height: 5\' 6"  (1.676 m)   Body mass index is 22.44 kg/m.  Advanced Directives 08/30/2021 08/27/2020 09/22/2019 05/09/2019 02/08/2019 01/28/2019 05/05/2018  Does Patient Have a Medical Advance Directive? Yes No No No No No No  Type of Paramedic of [place];Living will - - - - - -  Does patient want to make changes to medical advance directive? - - - - - - -  Copy of [place] in Chart? No - copy requested - - - - - -  Would patient like information on creating a medical advance directive? - No - Patient declined - No - Patient declined No - Patient declined No - Patient declined No - Patient  declined    Current Medications (verified) Outpatient Encounter Medications as of 08/30/2021  Medication Sig   Alum & Mag Hydroxide-Simeth (GI COCKTAIL) SUSP suspension Take 30 mLs by mouth at bedtime. Shake well.   benazepril (LOTENSIN) 40 MG tablet Take 1 tablet (40 mg total) by mouth daily.   Cholecalciferol (VITAMIN D-3) 125 MCG (5000 UT) TABS Take 5,000 Units by mouth daily.   cloNIDine (CATAPRES) 0.1 MG tablet Take 1 tablet (0.1 mg total) by mouth 2 (two) times daily.   famotidine (PEPCID) 20 MG tablet Take 1 tablet (20 mg total) by mouth at bedtime.   feeding supplement, ENSURE ENLIVE, (ENSURE ENLIVE) LIQD Take 237 mLs by mouth 2 (two) times daily between meals.   fluticasone (FLONASE) 50 MCG/ACT nasal spray Place 2 sprays into both nostrils daily.   furosemide (LASIX) 40 MG tablet Take 1 tablet (40 mg total) by mouth daily.   levothyroxine (SYNTHROID) 75 MCG tablet Take 1 tablet (75 mcg total) by mouth daily.   lovastatin (MEVACOR) 40 MG tablet Take 1 tablet (40 mg total) by mouth at bedtime.   Multiple Vitamin (MULTIVITAMIN WITH MINERALS) TABS tablet Take 1 tablet by mouth daily.   pantoprazole (PROTONIX) 40 MG tablet Take 1 tablet (40 mg total) by mouth daily.   potassium chloride SA (KLOR-CON) 20 MEQ tablet Take 1 tablet (20 mEq total) by mouth daily.   raloxifene (EVISTA) 60 MG tablet Take 1 tablet (60 mg total) by mouth daily.   Rivaroxaban (XARELTO) 15 MG TABS tablet Take 1 tablet (15 mg total) by mouth  daily with supper.   traMADol (ULTRAM) 50 MG tablet Take 1 tablet (50 mg total) by mouth every 8 (eight) hours as needed.   No facility-administered encounter medications on file as of 08/30/2021.    Allergies (verified) Amlodipine, Macrodantin, Metformin and related, and Penicillins   History: Past Medical History:  Diagnosis Date   Anxiety    Aortic insufficiency    a. Trivial AI by echo 02/2016.   Atrial fibrillation and flutter ([place])    a. Coarse afib vs flutter by  EKG 12/2015.   Cancer ([place])    skin cancer   Cataract    Chronic diastolic CHF (congestive heart failure) (HCC)    CKD (chronic kidney disease), stage III ([place])    COVID-19    GERD (gastroesophageal reflux disease)    Hiatal hernia    Hypercholesterolemia    Hypertension    Hypokalemia    Hypothyroidism    Left knee pain    NIDDM (non-insulin dependent diabetes mellitus)    diet controlled    Osteoporosis    Premature atrial contractions    PVC's (premature ventricular contractions)    Vitamin D deficiency    Past Surgical History:  Procedure Laterality Date   A-FLUTTER ABLATION N/A 02/08/2019   Procedure: A-FLUTTER ABLATION;  Surgeon: Thompson Grayer, MD;  Location: [facility];  Service: Cardiovascular;  Laterality: N/A;   ABDOMINAL HYSTERECTOMY     APPENDECTOMY  1980   BACK SURGERY     CATARACT EXTRACTION, BILATERAL     CHOLECYSTECTOMY  5/00   COLONOSCOPY     implantable loop recorder placement  04/06/2019   MDT Reveal LINQ [id number] ([id number]) implanted for evaluation of palpitations and afib post atrial flutter ablation by Dr Rayann Heman in office   INTRAMEDULLARY (IM) NAIL INTERTROCHANTERIC Left 09/23/2019   Procedure: INTRAMEDULLARY (IM) NAIL INTERTROCHANTRIC;  Surgeon: Shona Needles, MD;  Location: [facility];  Service: Orthopedics;  Laterality: Left;   TONSILLECTOMY     TOTAL ABDOMINAL HYSTERECTOMY W/ BILATERAL SALPINGOOPHORECTOMY  1980   UPPER GASTROINTESTINAL ENDOSCOPY     Family History  Problem Relation Age of Onset   Diabetes Mother    Stroke Mother    Heart disease Father    Stroke Father    Uterine cancer Sister    Diabetes Sister    Ovarian cancer Sister    Colon cancer Sister    Diabetes Sister    Liver cancer Sister        \   Diabetes Brother    Dementia Brother    Atrial fibrillation Sister    Diabetes Sister    Diabetes Son    Healthy Son    Heart attack Neg Hx    Hypertension Neg Hx    Esophageal cancer Neg Hx    Rectal cancer Neg Hx     Stomach cancer Neg Hx    Social History   Socioeconomic History   Marital status: Widowed    Spouse name: Not on file   Number of children: 2   Years of education: Not on file   Highest education level: High school graduate  Occupational History   Occupation: Retired    Comment: Teacher, adult education , golf, farm   Tobacco Use   Smoking status: Never   Smokeless tobacco: Never  Vaping Use   Vaping Use: Never used  Substance and Sexual Activity   Alcohol use: No   Drug use: No   Sexual activity: Not Currently  Other Topics  Concern   Not on file  Social History Narrative   Patient is widowed she has 2 children she used to work in Charity fundraiser   One son in [place], another in [place] Strain: Low Risk    Difficulty of Paying Living Expenses: Not very hard  Food Insecurity: No Food Insecurity   Worried About Charity fundraiser in the Last Year: Never true   Arboriculturist in the Last Year: Never true  Transportation Needs: No Transportation Needs   Lack of Transportation (Medical): No   Lack of Transportation (Non-Medical): No  Physical Activity: Inactive   Days of Exercise per Week: 0 days   Minutes of Exercise per Session: 0 min  Stress: No Stress Concern Present   Feeling of Stress : Not at all  Social Connections: Socially Isolated   Frequency of Communication with Friends and Family: More than three times a week   Frequency of Social Gatherings with Friends and Family: More than three times a week   Attends Religious Services: Never   Marine scientist or Organizations: No   Attends Archivist Meetings: Never   Marital Status: Widowed    Tobacco Counseling Counseling given: Not Answered   Clinical Intake:  Pre-visit preparation completed: Yes  Pain : No/denies pain     BMI - recorded: 22.44 Nutritional Status: BMI of 19-24  Normal Nutritional Risks: None Diabetes: Yes CBG done?:  No Did pt. bring in CBG monitor from home?: No  How often do you need to have someone help you when you read instructions, pamphlets, or other written materials from your doctor or pharmacy?: 1 - Never  Diabetic? Nutrition Risk Assessment:  Has the patient had any N/V/D within the last 2 months?  No  Does the patient have any non-healing wounds?  No  Has the patient had any unintentional weight loss or weight gain?  No   Diabetes:  Is the patient diabetic?  Yes  If diabetic, was a CBG obtained today?  No  Did the patient bring in their glucometer from home?  No  How often do you monitor your CBG's? never.   Financial Strains and Diabetes Management:  Are you having any financial strains with the device, your supplies or your medication? No .  Does the patient want to be seen by Chronic Care Management for management of their diabetes?  No  Would the patient like to be referred to a Nutritionist or for Diabetic Management?  No   Diabetic Exams:  Diabetic Eye Exam: Completed 2022.   Diabetic Foot Exam: Completed 10/02/2020. Pt has been advised about the importance in completing this exam. Pt is scheduled for diabetic foot exam on next visit.    Interpreter Needed?: No  Information entered by :: Karrie Fluellen, LPN   Activities of Daily Living In your present state of health, do you have any difficulty performing the following activities: 08/30/2021  Hearing? Y  Comment wears hearing aids  Vision? N  Difficulty concentrating or making decisions? N  Walking or climbing stairs? Y  Comment mild  Dressing or bathing? N  Doing errands, shopping? N  Preparing Food and eating ? N  Using the Toilet? N  In the past six months, have you accidently leaked urine? Y  Comment urge incontinence  Do you have problems with loss of bowel control? N  Managing your Medications? N  Managing your Finances? N  Housekeeping or managing your Housekeeping? N  Some recent data might be hidden     Patient Care Team: [name], FNP as PCP - General (Nurse Practitioner) [name], MD as PCP - Cardiology (Cardiology) [name], MD as Consulting Physician (Cardiology) Renard Hamper Marguerita Merles (Physician Assistant) Jessy Oto, MD as Consulting Physician (Orthopedic Surgery) Melina Schools, OD (Optometry)  Indicate any recent Medical Services you may have received from other than [facility] providers in the past year (date may be approximate).     Assessment:   This is a routine wellness examination for Marian.  Hearing/Vision screen Hearing Screening - Comments:: Wears hearing difficulties - from [place]:: Denies vision difficulties - up to date with annual eye exams with [facility]  Dietary issues and exercise activities discussed: Current Exercise Habits: The patient does not participate in regular exercise at present, Exercise limited by: cardiac condition(s)   Goals Addressed             This Visit's Progress    AWV   On track    08/27/2020 AWV Goal: Fall Prevention  Over the next year, patient will decrease their risk for falls by: Using assistive devices, such as a cane or walker, as needed Identifying fall risks within their home and correcting them by: Removing throw rugs Adding handrails to stairs or ramps Removing clutter and keeping a clear pathway throughout the home Increasing light, especially at night Adding shower handles/bars Raising toilet seat Identifying potential personal risk factors for falls: Medication side effects Incontinence/urgency Vestibular dysfunction Hearing loss Musculoskeletal disorders Neurological disorders Orthostatic hypotension  08/27/2020 AWV Goal: Diabetes Management  Patient will maintain an A1C level below 8.0 Patient will not develop any diabetic foot complications Patient will not experience any hypoglycemic episodes over the next 3  months Patient will notify our office of any CBG readings outside of the provider recommended range by calling [phone] Patient will adhere to provider recommendations for diabetes management  Patient Self Management Activities take all medications as prescribed and report any negative side effects monitor and record blood sugar readings as directed adhere to a low carbohydrate diet that incorporates lean proteins, vegetables, whole grains, low glycemic fruits check feet daily noting any sores, cracks, injuries, or callous formations see PCP or podiatrist if she notices any changes in her legs, feet, or toenails Patient will visit PCP and have an A1C level checked every 3 to 6 months as directed  have a yearly eye exam to monitor for vascular changes associated with diabetes and will request that the report be sent to her pcp.  consult with her PCP regarding any changes in her health or new or worsening symptoms      DIET - EAT MORE FRUITS AND VEGETABLES   On track    Exercise 150 minutes per week (moderate activity)   Not on track      Depression Screen [facility] 2/9 Scores 08/30/2021 07/24/2021 06/11/2021 06/07/2021 04/02/2021 03/04/2021 10/02/2020  PHQ - 2 Score 0 0 0 0 0 0 0  PHQ- 9 Score 0 0 0 0 0 - -    Fall Risk Fall Risk  08/30/2021 07/24/2021 06/11/2021 06/07/2021 04/02/2021  Falls in the past year? 0 0 0 0 0  Number falls in past yr: 0 - - - -  Injury with Fall? 0 - - - -  Risk for fall due to : Orthopedic patient;History of fall(s);Impaired balance/gait;Medication side effect - - - -  Risk for fall due to: Comment - - - - -  Follow up Falls prevention discussed - - - -    FALL RISK PREVENTION PERTAINING TO THE HOME:  Any stairs in or around the home? No  If so, are there any without handrails? No  Home free of loose throw rugs in walkways, pet beds, electrical cords, etc? Yes  Adequate lighting in your home to reduce risk of falls? Yes   ASSISTIVE DEVICES UTILIZED TO PREVENT  FALLS:  Life alert? No  Use of a cane, walker or w/c? No  Grab bars in the bathroom? Yes  Shower chair or bench in shower? Yes  Elevated toilet seat or a handicapped toilet? Yes   TIMED UP AND GO:  Was the test performed? No . Telephonic visit  Cognitive Function: Normal cognitive status assessed by direct observation by this Nurse Health Advisor. No abnormalities found.    MMSE - Mini Mental State Exam 05/05/2018 04/06/2017 03/26/2016  Orientation to time 5 4 5   Orientation to Place 5 5 5   Registration 3 3 3   Attention/ Calculation 5 5 5   Recall 3 2 2   Language- name 2 objects 2 2 2   Language- repeat 1 1 0  Language- follow 3 step command 3 3 3   Language- read & follow direction 1 1 1   Write a sentence 1 1 1   Copy design 1 1 0  Total score 30 28 27      6CIT Screen 08/27/2020 05/09/2019  What Year? 0 points 0 points  What month? 0 points 0 points  What time? 0 points 0 points  Count back from 20 0 points 0 points  Months in reverse 0 points 0 points  Repeat phrase 0 points 0 points  Total Score 0 0    Immunizations Immunization History  Administered Date(s) Administered   Fluad Quad(high Dose 65+) 04/22/2019, 06/29/2020   Influenza, High Dose Seasonal PF 04/27/2017, 06/01/2018   Influenza,inj,Quad PF,6+ Mos 05/09/2013, 05/18/2014, 04/17/2016   Influenza-Unspecified 04/28/2017   Moderna Sars-Covid-2 Vaccination 04/17/2020, 05/02/2020   Pneumococcal Conjugate-13 02/07/2015   Pneumococcal Polysaccharide-23 06/30/2012   Tdap 12/01/2011   Zoster Recombinat (Shingrix) 04/28/2017, 07/16/2017    TDAP status: Up to date  Flu Vaccine status: Up to date  Pneumococcal vaccine status: Up to date  Covid-19 vaccine status: Completed vaccines  Qualifies for Shingles Vaccine? Yes   Zostavax completed Yes   Shingrix Completed?: Yes  Screening Tests Health Maintenance  Topic Date Due   OPHTHALMOLOGY EXAM  01/07/2016   COVID-19 Vaccine (3 - Mixed Product risk series)  05/30/2020   INFLUENZA VACCINE  11/15/2021 (Originally 03/18/2021)   DEXA SCAN  04/02/2022 (Originally 09/23/2019)   COLONOSCOPY (Pts 45-70yrs Insurance coverage will need to be confirmed)  04/02/2022 (Originally 05/28/2017)   FOOT EXAM  10/02/2021   HEMOGLOBIN A1C  10/03/2021   TETANUS/TDAP  11/30/2021   MAMMOGRAM  05/15/2022   Pneumonia Vaccine 31+ Years old  Completed   Zoster Vaccines- Shingrix  Completed   HPV VACCINES  Aged Out    Health Maintenance  Health Maintenance Due  Topic Date Due   OPHTHALMOLOGY EXAM  01/07/2016   COVID-19 Vaccine (3 - Mixed Product risk series) 05/30/2020    Colorectal cancer screening: No longer required.   Mammogram status: Completed 05/15/2021. Repeat every year  Bone Density status: Completed 285/2019. Results reflect: Bone density results: OSTEOPENIA. Repeat every 2 years.  Lung Cancer Screening: (Low Dose CT Chest recommended if Age 7-80 years, 30 pack-year  currently smoking OR have quit w/in 15years.) does not qualify  Additional Screening:  Hepatitis C Screening: does not qualify  Vision Screening: Recommended annual ophthalmology exams for early detection of glaucoma and other disorders of the eye. Is the patient up to date with their annual eye exam?  Yes  Who is the provider or what is the name of the office in which the patient attends annual eye exams? [facility] If pt is not established with a provider, would they like to be referred to a provider to establish care? No .   Dental Screening: Recommended annual dental exams for proper oral hygiene  Community Resource Referral / Chronic Care Management: CRR required this visit?  No   CCM required this visit?  No      Plan:     I have personally reviewed and noted the following in the patients chart:   Medical and social history Use of alcohol, tobacco or illicit drugs  Current medications and supplements including opioid prescriptions.  Functional ability and  status Nutritional status Physical activity Advanced directives List of other physicians Hospitalizations, surgeries, and ER visits in previous 12 months Vitals Screenings to include cognitive, depression, and falls Referrals and appointments  In addition, I have reviewed and discussed with patient certain preventive protocols, quality metrics, and best practice recommendations. A written personalized care plan for preventive services as well as general preventive health recommendations were provided to patient.     Sandrea Hammond, LPN   08/23/2692   Nurse Notes: None

## 2021-09-16 ENCOUNTER — Telehealth: Payer: Self-pay | Admitting: Nurse Practitioner

## 2021-09-16 ENCOUNTER — Ambulatory Visit (INDEPENDENT_AMBULATORY_CARE_PROVIDER_SITE_OTHER): Payer: Medicare Other

## 2021-09-16 DIAGNOSIS — K219 Gastro-esophageal reflux disease without esophagitis: Secondary | ICD-10-CM

## 2021-09-16 DIAGNOSIS — I495 Sick sinus syndrome: Secondary | ICD-10-CM | POA: Diagnosis not present

## 2021-09-16 LAB — CUP PACEART REMOTE DEVICE CHECK
Date Time Interrogation Session: 20230129233026
Implantable Pulse Generator Implant Date: 20200819

## 2021-09-16 MED ORDER — GI COCKTAIL ~~LOC~~: 30.0000 mL | Freq: Every day | ORAL | 2 refills | Status: DC

## 2021-09-16 NOTE — Telephone Encounter (Signed)
Patient aware and verbalized understanding. °

## 2021-09-16 NOTE — Telephone Encounter (Signed)
°  Prescription Request  09/16/2021  Is this a "Controlled Substance" medicine? no  Have you seen your PCP in the last 2 weeks? No,pt states that MMM will send this in  If YES, route message to pool  -  If NO, patient needs to be scheduled for appointment.  What is the name of the medication or equipment? Alum & Mag Hydroxide-Simeth (GI COCKTAIL) SUSP suspension   Have you contacted your pharmacy to request a refill? no   Which pharmacy would you like this sent to? [facility] in [place] Fax [phone]  Patient notified that their request is being sent to the clinical staff for review and that they should receive a response within 2 business days.

## 2021-09-16 NOTE — Telephone Encounter (Signed)
Rx for GI cocktail sent to pharamcy

## 2021-09-19 ENCOUNTER — Other Ambulatory Visit: Payer: Self-pay | Admitting: Family Medicine

## 2021-09-19 ENCOUNTER — Telehealth: Payer: Self-pay | Admitting: Nurse Practitioner

## 2021-09-19 DIAGNOSIS — K219 Gastro-esophageal reflux disease without esophagitis: Secondary | ICD-10-CM

## 2021-09-19 MED ORDER — GI COCKTAIL ~~LOC~~: 30.0000 mL | Freq: Every day | ORAL | 2 refills | Status: DC

## 2021-09-19 NOTE — Telephone Encounter (Signed)
LMOVM RF for the GI cocktail has been faxed to [facility]

## 2021-09-19 NOTE — Telephone Encounter (Signed)
TC to [facility], they did not receive fax script for the GI cocktail, will reorder & send to [facility]

## 2021-09-23 ENCOUNTER — Other Ambulatory Visit: Payer: Self-pay | Admitting: Nurse Practitioner

## 2021-09-23 DIAGNOSIS — E876 Hypokalemia: Secondary | ICD-10-CM

## 2021-09-23 DIAGNOSIS — R609 Edema, unspecified: Secondary | ICD-10-CM

## 2021-09-23 DIAGNOSIS — E039 Hypothyroidism, unspecified: Secondary | ICD-10-CM

## 2021-09-23 DIAGNOSIS — E782 Mixed hyperlipidemia: Secondary | ICD-10-CM

## 2021-09-24 NOTE — Progress Notes (Signed)
Carelink Summary Report / Loop Recorder 

## 2021-10-03 ENCOUNTER — Ambulatory Visit (INDEPENDENT_AMBULATORY_CARE_PROVIDER_SITE_OTHER): Payer: Medicare Other | Admitting: Nurse Practitioner

## 2021-10-03 ENCOUNTER — Encounter: Payer: Self-pay | Admitting: Nurse Practitioner

## 2021-10-03 VITALS — BP 140/78 | HR 70 | Temp 98.9°F | Resp 20 | Ht 66.0 in | Wt 144.0 lb

## 2021-10-03 DIAGNOSIS — M8580 Other specified disorders of bone density and structure, unspecified site: Secondary | ICD-10-CM

## 2021-10-03 DIAGNOSIS — I4819 Other persistent atrial fibrillation: Secondary | ICD-10-CM

## 2021-10-03 DIAGNOSIS — E785 Hyperlipidemia, unspecified: Secondary | ICD-10-CM | POA: Diagnosis not present

## 2021-10-03 DIAGNOSIS — R1110 Vomiting, unspecified: Secondary | ICD-10-CM

## 2021-10-03 DIAGNOSIS — E782 Mixed hyperlipidemia: Secondary | ICD-10-CM | POA: Diagnosis not present

## 2021-10-03 DIAGNOSIS — R609 Edema, unspecified: Secondary | ICD-10-CM

## 2021-10-03 DIAGNOSIS — I5032 Chronic diastolic (congestive) heart failure: Secondary | ICD-10-CM | POA: Diagnosis not present

## 2021-10-03 DIAGNOSIS — E876 Hypokalemia: Secondary | ICD-10-CM | POA: Diagnosis not present

## 2021-10-03 DIAGNOSIS — M545 Low back pain, unspecified: Secondary | ICD-10-CM

## 2021-10-03 DIAGNOSIS — N1832 Chronic kidney disease, stage 3b: Secondary | ICD-10-CM

## 2021-10-03 DIAGNOSIS — E039 Hypothyroidism, unspecified: Secondary | ICD-10-CM

## 2021-10-03 DIAGNOSIS — I10 Essential (primary) hypertension: Secondary | ICD-10-CM | POA: Diagnosis not present

## 2021-10-03 DIAGNOSIS — E119 Type 2 diabetes mellitus without complications: Secondary | ICD-10-CM

## 2021-10-03 DIAGNOSIS — Z6826 Body mass index (BMI) 26.0-26.9, adult: Secondary | ICD-10-CM

## 2021-10-03 DIAGNOSIS — K5909 Other constipation: Secondary | ICD-10-CM | POA: Diagnosis not present

## 2021-10-03 DIAGNOSIS — G8929 Other chronic pain: Secondary | ICD-10-CM

## 2021-10-03 DIAGNOSIS — K219 Gastro-esophageal reflux disease without esophagitis: Secondary | ICD-10-CM

## 2021-10-03 DIAGNOSIS — F5101 Primary insomnia: Secondary | ICD-10-CM

## 2021-10-03 LAB — BAYER DCA HB A1C WAIVED: HB A1C (BAYER DCA - WAIVED): 6.5 % — ABNORMAL HIGH (ref 4.8–5.6)

## 2021-10-03 MED ORDER — FAMOTIDINE 20 MG PO TABS: 20.0000 mg | ORAL_TABLET | Freq: Every day | ORAL | 1 refills | Status: DC

## 2021-10-03 MED ORDER — LOVASTATIN 40 MG PO TABS: 40.0000 mg | ORAL_TABLET | Freq: Every day | ORAL | 1 refills | Status: DC

## 2021-10-03 MED ORDER — RALOXIFENE HCL 60 MG PO TABS: 60.0000 mg | ORAL_TABLET | Freq: Every day | ORAL | 1 refills | Status: DC

## 2021-10-03 MED ORDER — CLONIDINE HCL 0.1 MG PO TABS: 0.1000 mg | ORAL_TABLET | Freq: Two times a day (BID) | ORAL | 1 refills | Status: DC

## 2021-10-03 MED ORDER — RIVAROXABAN 15 MG PO TABS: 15.0000 mg | ORAL_TABLET | Freq: Every day | ORAL | 1 refills | Status: DC

## 2021-10-03 MED ORDER — GI COCKTAIL ~~LOC~~: 30.0000 mL | Freq: Every day | ORAL | 2 refills | Status: DC

## 2021-10-03 MED ORDER — BENAZEPRIL HCL 40 MG PO TABS: 40.0000 mg | ORAL_TABLET | Freq: Every day | ORAL | 1 refills | Status: DC

## 2021-10-03 MED ORDER — POTASSIUM CHLORIDE CRYS ER 20 MEQ PO TBCR: 20.0000 meq | EXTENDED_RELEASE_TABLET | Freq: Every day | ORAL | 1 refills | Status: DC

## 2021-10-03 MED ORDER — PANTOPRAZOLE SODIUM 40 MG PO TBEC: 40.0000 mg | DELAYED_RELEASE_TABLET | Freq: Every day | ORAL | 1 refills | Status: DC

## 2021-10-03 MED ORDER — LEVOTHYROXINE SODIUM 75 MCG PO TABS: 75.0000 ug | ORAL_TABLET | Freq: Every day | ORAL | 1 refills | Status: DC

## 2021-10-03 MED ORDER — FUROSEMIDE 40 MG PO TABS: 40.0000 mg | ORAL_TABLET | Freq: Every day | ORAL | 1 refills | Status: DC

## 2021-10-03 NOTE — Patient Instructions (Signed)

## 2021-10-03 NOTE — Progress Notes (Signed)
Subjective:    Patient ID: Katie [name], female    DOB: 26-Sep-1931, 88 y.o.   MRN: [id number]   Chief Complaint: medical management of chronic issues     HPI:  Katie [name] is a 88 y.o. who identifies as a female who was assigned female at birth.   Social history: Lives with: by herself since her husband died last year- family calls her daily and check on her Work history: reitred   Comes in today for follow up of the following chronic medical issues:  1. Primary hypertension No c/o chest pain, sob or headache. Does check blood pressure at home and her blood pressure at home has been all over the place. She is on clonidine bid and has been taking around 2 in afternoon and then again in evening.  BP Readings from Last 3 Encounters:  07/24/21 (!) 178/82  06/11/21 (!) 148/67  06/07/21 (!) 172/78     2. Chronic diastolic CHF (congestive heart failure) (HCC) 3. Persistent atrial fibrillation ([place]) Has not actually seen cardiology in over a year. She has a pacemaker in and they interrogate it monthly. She says she is doing well. Denies palpitations or heart racing  4. Gastroesophageal reflux disease without esophagitis Takes protonix daily and actually also takres a GI cocktail dialy. Seems to help. Still has occasional symptoms.  5. Diabetes mellitus type 2, diet-controlled ([place]) She doe snot check her blood sugar at home. Is currently just doing diet control of diabetes. Lab Results  Component Value Date   HGBA1C 6.0 04/02/2021     6. Acquired hypothyroidism No problems that she is aware of. Lab Results  Component Value Date   TSH 5.480 (H) 10/02/2020     7. Stage 3b chronic kidney disease (HCC) No problems voiding Lab Results  Component Value Date   CREATININE 1.04 (H) 04/02/2021     8. Hyperlipidemia, unspecified hyperlipidemia type Does ttry to watch diet but does no dedicated exercise. Lab Results  Component Value Date   CHOL 154 04/02/2021   HDL 72  04/02/2021   LDLCALC 62 04/02/2021   TRIG 111 04/02/2021   CHOLHDL 2.1 04/02/2021     9. Hypomagnesemia Her  magenium level has not been checked in awhile.   10. Hypokalemia No c/o muscle cramping Lab Results  Component Value Date   K 4.4 04/02/2021     11. Regurgitation of food Has a lot of issues wth this and again is on protonix as well as GI cocktail.  12. Other constipation Not all of the time. She takes a stool softner when she needs it and that helps.  13. Peripheral edema Has edema by the end of day in bil ankles. Almost resolves at night  14. Primary insomnia Sleeps good most nights, but has an occasional night when she cannot fall asleep very easily.  15. Chronic midline low back pain without sciatica Some better. Doe snot have daily.  16. BMI 26.0-26.9,adult Weight is up 5 lbs Wt Readings from Last 3 Encounters:  10/03/21 144 lb (65.3 kg)  08/30/21 139 lb (63 kg)  07/24/21 139 lb (63 kg)   BMI Readings from Last 3 Encounters:  10/03/21 23.24 kg/m  08/30/21 22.44 kg/m  07/24/21 22.44 kg/m     New complaints: None today  Allergies  Allergen Reactions   Amlodipine Swelling   Macrodantin Nausea And Vomiting   Metformin And Related Nausea And Vomiting and Other (See Comments)    Bloating   Penicillins  Rash    Did it involve swelling of the face/tongue/throat, SOB, or low BP? Unknown Did it involve sudden or severe rash/hives, skin peeling, or any reaction on the inside of your mouth or nose? Yes Did you need to seek medical attention at a hospital or doctor's office? Yes When did it last happen? 2005  If all above answers are NO, may proceed with cephalosporin use.    Outpatient Encounter Medications as of 10/03/2021  Medication Sig   Alum & Mag Hydroxide-Simeth (GI COCKTAIL) SUSP suspension Take 30 mLs by mouth at bedtime. Shake well.   benazepril (LOTENSIN) 40 MG tablet Take 1 tablet (40 mg total) by mouth daily.   Cholecalciferol  (VITAMIN D-3) 125 MCG (5000 UT) TABS Take 5,000 Units by mouth daily.   cloNIDine (CATAPRES) 0.1 MG tablet Take 1 tablet (0.1 mg total) by mouth 2 (two) times daily.   famotidine (PEPCID) 20 MG tablet Take 1 tablet (20 mg total) by mouth at bedtime.   feeding supplement, ENSURE ENLIVE, (ENSURE ENLIVE) LIQD Take 237 mLs by mouth 2 (two) times daily between meals.   fluticasone (FLONASE) 50 MCG/ACT nasal spray Place 2 sprays into both nostrils daily.   furosemide (LASIX) 40 MG tablet TAKE 1 TABLET DAILY   levothyroxine (SYNTHROID) 75 MCG tablet TAKE 1 TABLET DAILY   lovastatin (MEVACOR) 40 MG tablet TAKE 1 TABLET AT BEDTIME   Multiple Vitamin (MULTIVITAMIN WITH MINERALS) TABS tablet Take 1 tablet by mouth daily.   pantoprazole (PROTONIX) 40 MG tablet Take 1 tablet (40 mg total) by mouth daily.   potassium chloride SA (KLOR-CON M) 20 MEQ tablet TAKE 1 TABLET DAILY   raloxifene (EVISTA) 60 MG tablet Take 1 tablet (60 mg total) by mouth daily.   Rivaroxaban (XARELTO) 15 MG TABS tablet Take 1 tablet (15 mg total) by mouth daily with supper.   traMADol (ULTRAM) 50 MG tablet Take 1 tablet (50 mg total) by mouth every 8 (eight) hours as needed.   No facility-administered encounter medications on file as of 10/03/2021.    Past Surgical History:  Procedure Laterality Date   A-FLUTTER ABLATION N/A 02/08/2019   Procedure: A-FLUTTER ABLATION;  Surgeon: Thompson Grayer, MD;  Location: [facility];  Service: Cardiovascular;  Laterality: N/A;   ABDOMINAL HYSTERECTOMY     APPENDECTOMY  1980   BACK SURGERY     CATARACT EXTRACTION, BILATERAL     CHOLECYSTECTOMY  5/00   COLONOSCOPY     implantable loop recorder placement  04/06/2019   MDT Reveal LINQ [id number] ([id number]) implanted for evaluation of palpitations and afib post atrial flutter ablation by Dr Rayann Heman in office   INTRAMEDULLARY (IM) NAIL INTERTROCHANTERIC Left 09/23/2019   Procedure: INTRAMEDULLARY (IM) NAIL INTERTROCHANTRIC;  Surgeon: Shona Needles, MD;  Location: [facility];  Service: Orthopedics;  Laterality: Left;   TONSILLECTOMY     TOTAL ABDOMINAL HYSTERECTOMY W/ BILATERAL SALPINGOOPHORECTOMY  1980   UPPER GASTROINTESTINAL ENDOSCOPY      Family History  Problem Relation Age of Onset   Diabetes Mother    Stroke Mother    Heart disease Father    Stroke Father    Uterine cancer Sister    Diabetes Sister    Ovarian cancer Sister    Colon cancer Sister    Diabetes Sister    Liver cancer Sister        \   Diabetes Brother    Dementia Brother    Atrial fibrillation Sister    Diabetes Sister  Diabetes Son    Healthy Son    Heart attack Neg Hx    Hypertension Neg Hx    Esophageal cancer Neg Hx    Rectal cancer Neg Hx    Stomach cancer Neg Hx       Controlled substance contract: n/a     Review of Systems  Constitutional:  Negative for diaphoresis.  Eyes:  Negative for pain.  Respiratory:  Negative for shortness of breath.   Cardiovascular:  Negative for chest pain, palpitations and leg swelling.  Gastrointestinal:  Negative for abdominal pain.  Endocrine: Negative for polydipsia.  Skin:  Negative for rash.  Neurological:  Negative for dizziness, weakness and headaches.  Hematological:  Does not bruise/bleed easily.  All other systems reviewed and are negative.     Objective:   Physical Exam Vitals and nursing note reviewed.  Constitutional:      General: She is not in acute distress.    Appearance: Normal appearance. She is well-developed.  HENT:     Head: Normocephalic.     Right Ear: Tympanic membrane normal.     Left Ear: Tympanic membrane normal.     Nose: Nose normal.     Mouth/Throat:     Mouth: Mucous membranes are moist.  Eyes:     Pupils: Pupils are equal, round, and reactive to light.  Neck:     Vascular: No carotid bruit or JVD.  Cardiovascular:     Rate and Rhythm: Normal rate and regular rhythm.     Heart sounds: Normal heart sounds.  Pulmonary:     Effort: Pulmonary effort  is normal. No respiratory distress.     Breath sounds: Normal breath sounds. No wheezing or rales.  Chest:     Chest wall: No tenderness.  Abdominal:     General: Bowel sounds are normal. There is no distension or abdominal bruit.     Palpations: Abdomen is soft. There is no hepatomegaly, splenomegaly, mass or pulsatile mass.     Tenderness: There is no abdominal tenderness.  Musculoskeletal:        General: Normal range of motion.     Cervical back: Normal range of motion and neck supple.     Comments: ambulating with a cane  Lymphadenopathy:     Cervical: No cervical adenopathy.  Skin:    General: Skin is warm and dry.  Neurological:     Mental Status: She is alert and oriented to person, place, and time.     Deep Tendon Reflexes: Reflexes are normal and symmetric.  Psychiatric:        Behavior: Behavior normal.        Thought Content: Thought content normal.        Judgment: Judgment normal.   BP 140/78 (BP Location: Right Arm, Patient Position: Sitting, Cuff Size: Normal)    Pulse 70    Temp 98.9 F (37.2 C) (Temporal)    Resp 20    Ht _0  (1.676 m)    Wt 144 lb (65.3 kg)    SpO2 98%    BMI 23.24 kg/m    HGBA1c 6.5%       Assessment & Plan:   .AZANA KIESLER comes in today with chief complaint of Medical Management of Chronic Issues   Diagnosis and orders addressed:  1. Primary hypertension Low sodium diet Take clonidine in morning and in evening. Stop waiting and take in afternoon - CBC with Differential/Platelet - CMP14+EGFR - cloNIDine (CATAPRES) 0.1 MG tablet; Take  1 tablet (0.1 mg total) by mouth 2 (two) times daily.  Dispense: 180 tablet; Refill: 1 - benazepril (LOTENSIN) 40 MG tablet; Take 1 tablet (40 mg total) by mouth daily.  Dispense: 90 tablet; Refill: 1  2. Chronic diastolic CHF (congestive heart failure) (HCC) 3. Persistent atrial fibrillation ([place]) Keep follow up with cardiology - Rivaroxaban (XARELTO) 15 MG TABS tablet; Take 1 tablet (15 mg  total) by mouth daily with supper.  Dispense: 90 tablet; Refill: 1  4. Gastroesophageal reflux disease without esophagitis Avoid spicy foods Do not eat 2 hours prior to bedtime - Alum & Mag Hydroxide-Simeth (GI COCKTIL) SUSP suspension; Take 30 mLs by mouth at bedtime. Shake well.  Dispense: 900 mL; Refill: 2 - pantoprazole (PROTONIX) 40 MG tablet; Take 1 tablet (40 mg total) by mouth daily.  Dispense: 90 tablet; Refill: 1 - famotidine (PEPCID) 20 MG tablet; Take 1 tablet (20 mg total) by mouth at bedtime.  Dispense: 90 tablet; Refill: 1  5. Diabetes mellitus type 2, diet-controlled ([place]) Continue to watch diet - Bayer DCA Hb A1c Waived  6. Acquired hypothyroidism Labs pending - Thyroid Panel With TSH - levothyroxine (SYNTHROID) 75 MCG tablet; Take 1 tablet (75 mcg total) by mouth daily.  Dispense: 90 tablet; Refill: 1  7. Stage 3b chronic kidney disease ([place]) Labs pending  8. Mixed hyperlipidemia Low fat diet - Lipid panel - lovastatin (MEVACOR) 40 MG tablet; Take 1 tablet (40 mg total) by mouth at bedtime.  Dispense: 90 tablet; Refill: 1  9. Hypomagnesemia  10. Hypokalemia Labs pending - potassium chloride SA (KLOR-CON M) 20 MEQ tablet; Take 1 tablet (20 mEq total) by mouth daily.  Dispense: 90 tablet; Refill: 1  11. Regurgitation of food  12. Other constipation Continue stool spftner  13. Peripheral edema Elevate legs when sitting - furosemide (LASIX) 40 MG tablet; Take 1 tablet (40 mg total) by mouth daily.  Dispense: 90 tablet; Refill: 1  14. Primary insomnia Bedtime routine  15. Chronic midline low back pain without sciatica   16. BMI 26.0-26.9,adult Discussed diet and exercise for person with BMI >25 Will recheck weight in 3-6 months   17. Osteopenia, senile Weight bearing exercises - raloxifene (EVISTA) 60 MG tablet; Take 1 tablet (60 mg total) by mouth daily.  Dispense: 90 tablet; Refill: 1   Labs pending Health Maintenance reviewed Diet and  exercise encouraged  Follow up plan: 6 months   Katie Hassell Done, FNP

## 2021-10-04 LAB — CBC WITH DIFFERENTIAL/PLATELET
Basophils Absolute: 0 10*3/uL (ref 0.0–0.2)
Basos: 0 %
EOS (ABSOLUTE): 0.1 10*3/uL (ref 0.0–0.4)
Eos: 2 %
Hematocrit: 36.7 % (ref 34.0–46.6)
Hemoglobin: 12.4 g/dL (ref 11.1–15.9)
Immature Grans (Abs): 0 10*3/uL (ref 0.0–0.1)
Immature Granulocytes: 0 %
Lymphocytes Absolute: 2.5 10*3/uL (ref 0.7–3.1)
Lymphs: 37 %
MCH: 32.8 pg (ref 26.6–33.0)
MCHC: 33.8 g/dL (ref 31.5–35.7)
MCV: 97 fL (ref 79–97)
Monocytes Absolute: 0.5 10*3/uL (ref 0.1–0.9)
Monocytes: 7 %
Neutrophils Absolute: 3.5 10*3/uL (ref 1.4–7.0)
Neutrophils: 54 %
Platelets: 232 10*3/uL (ref 150–450)
RBC: 3.78 x10E6/uL (ref 3.77–5.28)
RDW: 11.8 % (ref 11.7–15.4)
WBC: 6.6 10*3/uL (ref 3.4–10.8)

## 2021-10-04 LAB — CMP14+EGFR
ALT: 13 IU/L (ref 0–32)
AST: 22 IU/L (ref 0–40)
Albumin/Globulin Ratio: 1.8 (ref 1.2–2.2)
Albumin: 4.3 g/dL (ref 3.6–4.6)
Alkaline Phosphatase: 83 IU/L (ref 44–121)
BUN/Creatinine Ratio: 19 (ref 12–28)
BUN: 23 mg/dL (ref 8–27)
Bilirubin Total: 0.2 mg/dL (ref 0.0–1.2)
CO2: 25 mmol/L (ref 20–29)
Calcium: 9.4 mg/dL (ref 8.7–10.3)
Chloride: 99 mmol/L (ref 96–106)
Creatinine, Ser: 1.2 mg/dL — ABNORMAL HIGH (ref 0.57–1.00)
Globulin, Total: 2.4 g/dL (ref 1.5–4.5)
Glucose: 125 mg/dL — ABNORMAL HIGH (ref 70–99)
Potassium: 4.8 mmol/L (ref 3.5–5.2)
Sodium: 138 mmol/L (ref 134–144)
Total Protein: 6.7 g/dL (ref 6.0–8.5)
eGFR: 44 mL/min/{1.73_m2} — ABNORMAL LOW (ref >59)

## 2021-10-04 LAB — THYROID PANEL WITH TSH
Free Thyroxine Index: 2.6 (ref 1.2–4.9)
T3 Uptake Ratio: 27 % (ref 24–39)
T4, Total: 9.8 ug/dL (ref 4.5–12.0)
TSH: 1.95 u[IU]/mL (ref 0.450–4.500)

## 2021-10-04 LAB — LIPID PANEL
Chol/HDL Ratio: 2.3 ratio (ref 0.0–4.4)
Cholesterol, Total: 152 mg/dL (ref 100–199)
HDL: 67 mg/dL (ref >39)
LDL Chol Calc (NIH): 65 mg/dL (ref 0–99)
Triglycerides: 114 mg/dL (ref 0–149)
VLDL Cholesterol Cal: 20 mg/dL (ref 5–40)

## 2021-10-08 ENCOUNTER — Encounter: Payer: Medicare Other | Admitting: Physician Assistant

## 2021-10-10 ENCOUNTER — Other Ambulatory Visit: Payer: Self-pay | Admitting: Nurse Practitioner

## 2021-10-10 DIAGNOSIS — M545 Low back pain, unspecified: Secondary | ICD-10-CM

## 2021-10-10 DIAGNOSIS — G8929 Other chronic pain: Secondary | ICD-10-CM

## 2021-10-15 DIAGNOSIS — C44629 Squamous cell carcinoma of skin of left upper limb, including shoulder: Secondary | ICD-10-CM | POA: Diagnosis not present

## 2021-10-15 DIAGNOSIS — D485 Neoplasm of uncertain behavior of skin: Secondary | ICD-10-CM | POA: Diagnosis not present

## 2021-10-15 DIAGNOSIS — C44329 Squamous cell carcinoma of skin of other parts of face: Secondary | ICD-10-CM | POA: Diagnosis not present

## 2021-10-15 DIAGNOSIS — C4442 Squamous cell carcinoma of skin of scalp and neck: Secondary | ICD-10-CM | POA: Diagnosis not present

## 2021-10-17 NOTE — Progress Notes (Signed)
Electrophysiology Office Note Date: 10/18/2021  ID:  Katie [name], DOB 1931-11-23, MRN [id number]  PCP: [name], FNP Primary Cardiologist: Lauree Chandler, MD Electrophysiologist: Thompson Grayer, MD   CC: ILR follow-up  Katie [name] is a 88 y.o. female seen today for Dr. Rayann Heman . she presents today for routine electrophysiology followup.  Since last being seen in our clinic, the patient reports doing very well.  she denies chest pain, palpitations, dyspnea, PND, orthopnea, nausea, vomiting, dizziness, syncope, edema, weight gain, or early satiety.   Her BP runs consistently high at home. (> 140) although she doesn't check it very often.  Uses a lot of salt. "Food tastes bad" if she doesn't.  Takes ACEi in the am with clonidine in the am, then clonidine at bedtime alone.   Device History: Medtronic loop recorder implanted 03/2019 for Atrial fibrillation  Past Medical History:  Diagnosis Date   Anxiety    Aortic insufficiency    a. Trivial AI by echo 02/2016.   Atrial fibrillation and flutter ([place])    a. Coarse afib vs flutter by EKG 12/2015.   Cancer ([place])    skin cancer   Cataract    Chronic diastolic CHF (congestive heart failure) (HCC)    CKD (chronic kidney disease), stage III ([place])    COVID-19    GERD (gastroesophageal reflux disease)    Hiatal hernia    Hypercholesterolemia    Hypertension    Hypokalemia    Hypothyroidism    Left knee pain    NIDDM (non-insulin dependent diabetes mellitus)    diet controlled    Osteoporosis    Premature atrial contractions    PVC's (premature ventricular contractions)    Vitamin D deficiency    Past Surgical History:  Procedure Laterality Date   A-FLUTTER ABLATION N/A 02/08/2019   Procedure: A-FLUTTER ABLATION;  Surgeon: Thompson Grayer, MD;  Location: [facility];  Service: Cardiovascular;  Laterality: N/A;   ABDOMINAL HYSTERECTOMY     APPENDECTOMY  1980   BACK SURGERY     CATARACT EXTRACTION, BILATERAL      CHOLECYSTECTOMY  5/00   COLONOSCOPY     implantable loop recorder placement  04/06/2019   MDT Reveal LINQ [id number] ([id number]) implanted for evaluation of palpitations and afib post atrial flutter ablation by Dr Rayann Heman in office   INTRAMEDULLARY (IM) NAIL INTERTROCHANTERIC Left 09/23/2019   Procedure: INTRAMEDULLARY (IM) NAIL INTERTROCHANTRIC;  Surgeon: Shona Needles, MD;  Location: [facility];  Service: Orthopedics;  Laterality: Left;   TONSILLECTOMY     TOTAL ABDOMINAL HYSTERECTOMY W/ BILATERAL SALPINGOOPHORECTOMY  1980   UPPER GASTROINTESTINAL ENDOSCOPY      Current Outpatient Medications  Medication Sig Dispense Refill   Alum & Mag Hydroxide-Simeth (GI COCKTAIL) SUSP suspension Take 30 mLs by mouth at bedtime. Shake well. 900 mL 2   benazepril (LOTENSIN) 40 MG tablet Take 1 tablet (40 mg total) by mouth daily. 90 tablet 1   Cholecalciferol (VITAMIN D-3) 125 MCG (5000 UT) TABS Take 5,000 Units by mouth daily. 90 tablet 1   cloNIDine (CATAPRES) 0.1 MG tablet Take 1 tablet (0.1 mg total) by mouth 2 (two) times daily. 180 tablet 1   famotidine (PEPCID) 20 MG tablet Take 1 tablet (20 mg total) by mouth at bedtime. 90 tablet 1   feeding supplement, ENSURE ENLIVE, (ENSURE ENLIVE) LIQD Take 237 mLs by mouth 2 (two) times daily between meals.     fluticasone (FLONASE) 50 MCG/ACT nasal spray Place 2 sprays  into both nostrils daily. 16 g 6   furosemide (LASIX) 40 MG tablet Take 1 tablet (40 mg total) by mouth daily. 90 tablet 1   levothyroxine (SYNTHROID) 75 MCG tablet Take 1 tablet (75 mcg total) by mouth daily. 90 tablet 1   lovastatin (MEVACOR) 40 MG tablet Take 1 tablet (40 mg total) by mouth at bedtime. 90 tablet 1   Multiple Vitamin (MULTIVITAMIN WITH MINERALS) TABS tablet Take 1 tablet by mouth daily.     pantoprazole (PROTONIX) 40 MG tablet Take 1 tablet (40 mg total) by mouth daily. 90 tablet 1   potassium chloride SA (KLOR-CON M) 20 MEQ tablet Take 1 tablet (20 mEq total) by mouth daily. 90  tablet 1   raloxifene (EVISTA) 60 MG tablet Take 1 tablet (60 mg total) by mouth daily. 90 tablet 1   Rivaroxaban (XARELTO) 15 MG TABS tablet Take 1 tablet (15 mg total) by mouth daily with supper. 90 tablet 1   traMADol (ULTRAM) 50 MG tablet TAKE 1 TABLET EVERY 8 HOURS AS NEEDED (Patient taking differently: TAKE 1 TABLET BY MOUTH EVERY 8 HOURS AS NEEDED FOR PAIN) 90 tablet 1   No current facility-administered medications for this visit.    Allergies:   Amlodipine, Macrodantin, Metformin and related, and Penicillins   Social History: Social History   Socioeconomic History   Marital status: Widowed    Spouse name: Not on file   Number of children: 2   Years of education: Not on file   Highest education level: High school graduate  Occupational History   Occupation: Retired    Comment: Teacher, adult education , golf, farm   Tobacco Use   Smoking status: Never   Smokeless tobacco: Never  Vaping Use   Vaping Use: Never used  Substance and Sexual Activity   Alcohol use: No   Drug use: No   Sexual activity: Not Currently  Other Topics Concern   Not on file  Social History Narrative   Patient is widowed she has 2 children she used to work in Charity fundraiser   One son in [place], another in [place] Strain: Low Risk    Difficulty of Paying Living Expenses: Not very hard  Food Insecurity: No Food Insecurity   Worried About Charity fundraiser in the Last Year: Never true   Arboriculturist in the Last Year: Never true  Transportation Needs: No Transportation Needs   Lack of Transportation (Medical): No   Lack of Transportation (Non-Medical): No  Physical Activity: Inactive   Days of Exercise per Week: 0 days   Minutes of Exercise per Session: 0 min  Stress: No Stress Concern Present   Feeling of Stress : Not at all  Social Connections: Socially Isolated   Frequency of Communication with Friends and Family: More than three  times a week   Frequency of Social Gatherings with Friends and Family: More than three times a week   Attends Religious Services: Never   Marine scientist or Organizations: No   Attends Archivist Meetings: Never   Marital Status: Widowed  Human resources officer Violence: Not At Risk   Fear of Current or Ex-Partner: No   Emotionally Abused: No   Physically Abused: No   Sexually Abused: No    Family History: Family History  Problem Relation Age of Onset   Diabetes Mother    Stroke Mother    Heart disease Father  Stroke Father    Uterine cancer Sister    Diabetes Sister    Ovarian cancer Sister    Colon cancer Sister    Diabetes Sister    Liver cancer Sister        \   Diabetes Brother    Dementia Brother    Atrial fibrillation Sister    Diabetes Sister    Diabetes Son    Healthy Son    Heart attack Neg Hx    Hypertension Neg Hx    Esophageal cancer Neg Hx    Rectal cancer Neg Hx    Stomach cancer Neg Hx      Review of Systems: All other systems reviewed and are otherwise negative except as noted above.  Physical Exam: Vitals:   10/18/21 1155  BP: (!) 146/72  Pulse: 85  SpO2: 99%  Weight: 146 lb (66.2 kg)  Height: 5\' 6"  (1.676 m)     GEN- The patient is well appearing, alert and oriented x 3 today.   HEENT: normocephalic, atraumatic; sclera clear, conjunctiva pink; hearing intact; oropharynx clear; neck supple  Lungs- Clear to ausculation bilaterally, normal work of breathing.  No wheezes, rales, rhonchi Heart- Regular rate an2d rhythm, no murmurs, rubs or gallops  GI- soft, non-tender, non-distended, bowel sounds present  Extremities- no clubbing, cyanosis, or edema  MS- no significant deformity or atrophy Skin- warm and dry, no rash or lesion; PPM pocket well healed Psych- euthymic mood, full affect Neuro- strength and sensation are intact  PPM Interrogation- reviewed in detail today,  See PACEART report  EKG:  EKG is ordered today. The  ekg ordered today shows NSR at  Recent Labs: 10/03/2021: ALT 13; BUN 23; Creatinine, Ser 1.20; Hemoglobin 12.4; Platelets 232; Potassium 4.8; Sodium 138; TSH 1.950   Wt Readings from Last 3 Encounters:  10/18/21 146 lb (66.2 kg)  10/03/21 144 lb (65.3 kg)  08/30/21 139 lb (63 kg)     Other studies Reviewed: Additional studies/ records that were reviewed today include:   Assessment and Plan:  1. Atrial flutter s/p ablation 2. Paroxysmal atrial fibrillation AF burden <1% by device. Changed to least sensitive, aggressive ectopy rejection. CHA2DS2VASC is at least 6. Continue Xarelto   2. HTN Somewhat elevated on arrival Encouraged she move her benazapril to bedtime.    3. Chronic diastolic dysfunction Volume status stable on exam   Current medicines are reviewed at length with the patient today.   The patient does not have concerns regarding her medicines.  The following changes were made today:  none  Labs/ tests ordered today include:  Orders Placed This Encounter  Procedures   EKG 12-Lead   Disposition:   Follow up with EP APP in 12 Months   Signed, Shirley Friar, PA-C  10/18/2021 12:22 PM  [facility] [address] [place] [address] [phone] (office) [phone] (fax)

## 2021-10-18 ENCOUNTER — Other Ambulatory Visit: Payer: Self-pay

## 2021-10-18 ENCOUNTER — Encounter: Payer: Self-pay | Admitting: Student

## 2021-10-18 ENCOUNTER — Ambulatory Visit: Payer: Medicare Other | Admitting: Student

## 2021-10-18 VITALS — BP 146/72 | HR 85 | Ht 66.0 in | Wt 146.0 lb

## 2021-10-18 DIAGNOSIS — I495 Sick sinus syndrome: Secondary | ICD-10-CM

## 2021-10-18 DIAGNOSIS — I4819 Other persistent atrial fibrillation: Secondary | ICD-10-CM | POA: Diagnosis not present

## 2021-10-18 DIAGNOSIS — I10 Essential (primary) hypertension: Secondary | ICD-10-CM | POA: Diagnosis not present

## 2021-10-18 MED ORDER — BENAZEPRIL HCL 40 MG PO TABS: 40.0000 mg | ORAL_TABLET | Freq: Every day | ORAL | 1 refills | Status: DC

## 2021-10-18 NOTE — Patient Instructions (Signed)
Medication Instructions:  ?Your physician recommends that you continue on your current medications as directed. Please refer to the Current Medication list given to you today. ?  ?TAKE BENAZAPRIL AT BEDTIME ? ?*If you need a refill on your cardiac medications before your next appointment, please call your pharmacy* ? ? ?Lab Work: ?None ? ?If you have labs (blood work) drawn today and your tests are completely normal, you will receive your results only by: ?MyChart Message (if you have MyChart) OR ?A paper copy in the mail ?If you have any lab test that is abnormal or we need to change your treatment, we will call you to review the results. ? ? ?Follow-Up: ?At [facility], you and your health needs are our priority.  As part of our continuing mission to provide you with exceptional heart care, we have created designated Provider Care Teams.  These Care Teams include your primary Cardiologist (physician) and Advanced Practice Providers (APPs -  Physician Assistants and Nurse Practitioners) who all work together to provide you with the care you need, when you need it. ? ?Your next appointment:   ?1 year(s) ? ?The format for your next appointment:   ?In Person ? ?Provider:   ?You may see Thompson Grayer, MD or one of the following Advanced Practice Providers on your designated Care Team:   ?Tommye Standard, PA-C ?Legrand Como "Oda Kilts, PA-C{ ?

## 2021-10-21 ENCOUNTER — Ambulatory Visit (INDEPENDENT_AMBULATORY_CARE_PROVIDER_SITE_OTHER): Payer: Medicare Other

## 2021-10-21 DIAGNOSIS — I495 Sick sinus syndrome: Secondary | ICD-10-CM

## 2021-10-21 LAB — CUP PACEART REMOTE DEVICE CHECK
Date Time Interrogation Session: 20230303233320
Implantable Pulse Generator Implant Date: 20200819

## 2021-10-29 ENCOUNTER — Telehealth: Payer: Self-pay | Admitting: Nurse Practitioner

## 2021-10-29 DIAGNOSIS — K219 Gastro-esophageal reflux disease without esophagitis: Secondary | ICD-10-CM

## 2021-10-29 MED ORDER — GI COCKTAIL ~~LOC~~: 30.0000 mL | Freq: Every day | ORAL | 2 refills | Status: DC

## 2021-10-29 NOTE — Telephone Encounter (Signed)
Refused this morning, it came in through Rx errors & did not match meds on her list ?Last OV & Rf 10/03/21, RF was printed Next OV 04/04/22 ?

## 2021-11-04 NOTE — Progress Notes (Signed)
Carelink Summary Report / Loop Recorder 

## 2021-11-11 ENCOUNTER — Other Ambulatory Visit: Payer: Self-pay | Admitting: Nurse Practitioner

## 2021-11-25 ENCOUNTER — Ambulatory Visit (INDEPENDENT_AMBULATORY_CARE_PROVIDER_SITE_OTHER): Payer: Medicare Other

## 2021-11-25 DIAGNOSIS — I495 Sick sinus syndrome: Secondary | ICD-10-CM | POA: Diagnosis not present

## 2021-11-26 DIAGNOSIS — D044 Carcinoma in situ of skin of scalp and neck: Secondary | ICD-10-CM | POA: Diagnosis not present

## 2021-11-26 LAB — CUP PACEART REMOTE DEVICE CHECK
Date Time Interrogation Session: 20230406003757
Implantable Pulse Generator Implant Date: 20200819

## 2021-12-03 ENCOUNTER — Other Ambulatory Visit: Payer: Self-pay | Admitting: *Deleted

## 2021-12-03 ENCOUNTER — Telehealth: Payer: Self-pay | Admitting: Nurse Practitioner

## 2021-12-03 DIAGNOSIS — K219 Gastro-esophageal reflux disease without esophagitis: Secondary | ICD-10-CM

## 2021-12-03 MED ORDER — GI COCKTAIL ~~LOC~~: 30.0000 mL | Freq: Every day | ORAL | 2 refills | Status: DC

## 2021-12-03 NOTE — Telephone Encounter (Signed)
Gi cocktail sent to [facility] ?

## 2021-12-10 NOTE — Progress Notes (Signed)
Carelink Summary Report / Loop Recorder 

## 2021-12-11 DIAGNOSIS — Z48817 Encounter for surgical aftercare following surgery on the skin and subcutaneous tissue: Secondary | ICD-10-CM | POA: Diagnosis not present

## 2021-12-25 DIAGNOSIS — C44329 Squamous cell carcinoma of skin of other parts of face: Secondary | ICD-10-CM | POA: Diagnosis not present

## 2021-12-25 DIAGNOSIS — C44629 Squamous cell carcinoma of skin of left upper limb, including shoulder: Secondary | ICD-10-CM | POA: Diagnosis not present

## 2021-12-30 ENCOUNTER — Ambulatory Visit (INDEPENDENT_AMBULATORY_CARE_PROVIDER_SITE_OTHER): Payer: Medicare Other

## 2021-12-30 DIAGNOSIS — I495 Sick sinus syndrome: Secondary | ICD-10-CM | POA: Diagnosis not present

## 2021-12-30 LAB — CUP PACEART REMOTE DEVICE CHECK
Date Time Interrogation Session: 20230510231714
Implantable Pulse Generator Implant Date: 20200819

## 2022-01-10 ENCOUNTER — Telehealth: Payer: Self-pay | Admitting: Physical Medicine and Rehabilitation

## 2022-01-10 NOTE — Telephone Encounter (Signed)
Pt called requesting a call back for left hip injection. Please call pt at [phone]

## 2022-01-15 ENCOUNTER — Ambulatory Visit (INDEPENDENT_AMBULATORY_CARE_PROVIDER_SITE_OTHER): Payer: Medicare Other | Admitting: Family Medicine

## 2022-01-15 ENCOUNTER — Encounter: Payer: Self-pay | Admitting: Family Medicine

## 2022-01-15 VITALS — BP 143/69 | HR 89 | Temp 97.0°F | Resp 20 | Ht 66.0 in | Wt 146.0 lb

## 2022-01-15 DIAGNOSIS — M25552 Pain in left hip: Secondary | ICD-10-CM | POA: Diagnosis not present

## 2022-01-15 MED ORDER — HYDROCODONE-ACETAMINOPHEN 5-325 MG PO TABS: 1.0000 | ORAL_TABLET | Freq: Three times a day (TID) | ORAL | 0 refills | Status: DC | PRN

## 2022-01-15 NOTE — Progress Notes (Signed)
Assessment & Plan:  1. Left hip pain Controlled.  Patient understands she cannot take tramadol and hydrocodone in the same day.  She will keep her appointment next week with the orthopedic for a hip injection. - HYDROcodone-acetaminophen (NORCO) 5-325 MG tablet; Take 1 tablet by mouth every 8 (eight) hours as needed for up to 5 days for moderate pain.  Dispense: 15 tablet; Refill: 0   Follow up plan: Return if symptoms worsen or fail to improve.  Hendricks Limes, MSN, APRN, FNP-C [facility]  Subjective:   Patient ID: Katie [name], female    DOB: Oct 15, 1931, 88 y.o.   MRN: [id number]  HPI: Katie [name] is a 88 y.o. female presenting on 01/15/2022 for Hip Pain (Left - broke 3 yrs ago - still has pain - seen ortho and got inj in 06/2021 - sees ortho again 01/22/22. )  Patient reports chronic left hip pain since she broke it three years ago. Pain went away after a hip injection in November 2022. She reports the pain returned a couple of weeks ago and Tylenol was helping initially, but it got really bad five days ago. She describes the pain as shooting down her leg when she is walking; rates the pain 10/10. She is currently using a cane and is using a walker at home, which she reports she was not previously needing. She has an appointment scheduled with ortho next week for a repeat hip injection. She has a prescription for Tramadol which is normally effective, but she states it isn't touching the pain currently.    ROS: Negative unless specifically indicated above in HPI.   Relevant past medical history reviewed and updated as indicated.   Allergies and medications reviewed and updated.   Current Outpatient Medications:    Alum & Mag Hydroxide-Simeth (GI COCKTAIL) SUSP suspension, Take 30 mLs by mouth at bedtime. Shake well., Disp: 900 mL, Rfl: 2   benazepril (LOTENSIN) 40 MG tablet, Take 1 tablet (40 mg total) by mouth at bedtime., Disp: 90 tablet, Rfl: 1    Cholecalciferol (VITAMIN D-3) 125 MCG (5000 UT) TABS, Take 5,000 Units by mouth daily., Disp: 90 tablet, Rfl: 1   cloNIDine (CATAPRES) 0.1 MG tablet, Take 1 tablet (0.1 mg total) by mouth 2 (two) times daily., Disp: 180 tablet, Rfl: 1   famotidine (PEPCID) 20 MG tablet, Take 1 tablet (20 mg total) by mouth at bedtime., Disp: 90 tablet, Rfl: 1   fluticasone (FLONASE) 50 MCG/ACT nasal spray, USE 2 SPRAYS IN EACH NOSTRIL ONCE DAILY., Disp: 16 g, Rfl: 6   furosemide (LASIX) 40 MG tablet, Take 1 tablet (40 mg total) by mouth daily., Disp: 90 tablet, Rfl: 1   levothyroxine (SYNTHROID) 75 MCG tablet, Take 1 tablet (75 mcg total) by mouth daily., Disp: 90 tablet, Rfl: 1   lovastatin (MEVACOR) 40 MG tablet, Take 1 tablet (40 mg total) by mouth at bedtime., Disp: 90 tablet, Rfl: 1   Multiple Vitamin (MULTIVITAMIN WITH MINERALS) TABS tablet, Take 1 tablet by mouth daily., Disp:  , Rfl:    pantoprazole (PROTONIX) 40 MG tablet, Take 1 tablet (40 mg total) by mouth daily., Disp: 90 tablet, Rfl: 1   potassium chloride SA (KLOR-CON M) 20 MEQ tablet, Take 1 tablet (20 mEq total) by mouth daily., Disp: 90 tablet, Rfl: 1   raloxifene (EVISTA) 60 MG tablet, Take 1 tablet (60 mg total) by mouth daily., Disp: 90 tablet, Rfl: 1   Rivaroxaban (XARELTO) 15 MG TABS tablet,  Take 1 tablet (15 mg total) by mouth daily with supper., Disp: 90 tablet, Rfl: 1   traMADol (ULTRAM) 50 MG tablet, TAKE 1 TABLET EVERY 8 HOURS AS NEEDED (Patient taking differently: TAKE 1 TABLET BY MOUTH EVERY 8 HOURS AS NEEDED FOR PAIN), Disp: 90 tablet, Rfl: 1   feeding supplement, ENSURE ENLIVE, (ENSURE ENLIVE) LIQD, Take 237 mLs by mouth 2 (two) times daily between meals. (Patient not taking: Reported on 01/15/2022), Disp: , Rfl:   Allergies  Allergen Reactions   Shellfish Allergy Other (See Comments)   Amlodipine Swelling   Macrodantin Nausea And Vomiting   Metformin And Related Nausea And Vomiting and Other (See Comments)    Bloating    Penicillins Rash    Did it involve swelling of the face/tongue/throat, SOB, or low BP? Unknown Did it involve sudden or severe rash/hives, skin peeling, or any reaction on the inside of your mouth or nose? Yes Did you need to seek medical attention at a hospital or doctor's office? Yes When did it last happen? 2005  If all above answers are "NO", may proceed with cephalosporin use.     Objective:   BP (!) 143/69   Pulse 89   Temp (!) 97 F (36.1 C)   Resp 20   Ht '5\' 6"'$  (1.676 m)   Wt 146 lb (66.2 kg)   SpO2 95%   BMI 23.57 kg/m    Physical Exam Vitals reviewed.  Constitutional:      General: She is not in acute distress.    Appearance: Normal appearance. She is not ill-appearing, toxic-appearing or diaphoretic.  HENT:     Head: Normocephalic and atraumatic.  Eyes:     General: No scleral icterus.       Right eye: No discharge.        Left eye: No discharge.     Conjunctiva/sclera: Conjunctivae normal.  Cardiovascular:     Rate and Rhythm: Normal rate.  Pulmonary:     Effort: Pulmonary effort is normal. No respiratory distress.  Musculoskeletal:        General: Normal range of motion.     Cervical back: Normal range of motion.     Left hip: Tenderness present.  Skin:    General: Skin is warm and dry.     Capillary Refill: Capillary refill takes less than 2 seconds.  Neurological:     General: No focal deficit present.     Mental Status: She is alert and oriented to person, place, and time. Mental status is at baseline.  Psychiatric:        Mood and Affect: Mood normal.        Behavior: Behavior normal.        Thought Content: Thought content normal.        Judgment: Judgment normal.

## 2022-01-17 ENCOUNTER — Emergency Department (HOSPITAL_COMMUNITY)
Admission: EM | Admit: 2022-01-17 | Discharge: 2022-01-17 | Disposition: A | Discharge status: Home / Self Care | Payer: Medicare Other | Attending: Emergency Medicine | Admitting: Emergency Medicine

## 2022-01-17 ENCOUNTER — Other Ambulatory Visit: Payer: Self-pay

## 2022-01-17 ENCOUNTER — Encounter (HOSPITAL_COMMUNITY): Payer: Self-pay

## 2022-01-17 ENCOUNTER — Emergency Department (HOSPITAL_COMMUNITY): Payer: Medicare Other

## 2022-01-17 DIAGNOSIS — E039 Hypothyroidism, unspecified: Secondary | ICD-10-CM | POA: Diagnosis not present

## 2022-01-17 DIAGNOSIS — Z7901 Long term (current) use of anticoagulants: Secondary | ICD-10-CM | POA: Diagnosis not present

## 2022-01-17 DIAGNOSIS — M25562 Pain in left knee: Secondary | ICD-10-CM | POA: Diagnosis not present

## 2022-01-17 DIAGNOSIS — Z79899 Other long term (current) drug therapy: Secondary | ICD-10-CM | POA: Diagnosis not present

## 2022-01-17 DIAGNOSIS — M79652 Pain in left thigh: Secondary | ICD-10-CM | POA: Diagnosis not present

## 2022-01-17 DIAGNOSIS — I10 Essential (primary) hypertension: Secondary | ICD-10-CM | POA: Diagnosis not present

## 2022-01-17 DIAGNOSIS — M25552 Pain in left hip: Secondary | ICD-10-CM | POA: Diagnosis not present

## 2022-01-17 MED ORDER — DEXAMETHASONE SODIUM PHOSPHATE 10 MG/ML IJ SOLN
10.0000 mg | Freq: Once | INTRAMUSCULAR | Status: AC
Start: 2022-01-17 — End: 2022-01-17
  Administered 2022-01-17: 10 mg via INTRAMUSCULAR
  Filled 2022-01-17: qty 1

## 2022-01-17 MED ORDER — OXYCODONE-ACETAMINOPHEN 5-325 MG PO TABS
1.0000 | ORAL_TABLET | Freq: Once | ORAL | Status: AC
  Administered 2022-01-17: 1 via ORAL
  Filled 2022-01-17: qty 1

## 2022-01-17 MED ORDER — OXYCODONE HCL 5 MG PO TABS: 5.0000 mg | ORAL_TABLET | Freq: Four times a day (QID) | ORAL | 0 refills | Status: DC | PRN

## 2022-01-17 NOTE — ED Notes (Signed)
PT ambulatory to BR with 1 person assist.

## 2022-01-17 NOTE — Discharge Instructions (Addendum)
Please read and follow all provided instructions.  Your diagnoses today include:  1. Pain of left hip     Tests performed today include: An x-ray of the affected area - does not show a broken bone or problems with your previous surgery Vital signs. See below for your results today.   Medications prescribed:  Oxycodone - narcotic pain medication  DO NOT drive or perform any activities that require you to be awake and alert because this medicine can make you drowsy.   Also as we discussed, do not combine this medicine or mix this medicine with the tramadol or hydrocodone you were prescribed previously.  These can add up and cause you to have breathing problems or stop breathing.  Take any prescribed medications only as directed.  Home care instructions:  Follow any educational materials contained in this packet Follow R.I.C.E. Protocol: R - rest your injury  I  - use ice on injury without applying directly to skin C - compress injury with bandage or splint E - elevate the injury as much as possible  Follow-up instructions: Please follow-up with your orthopedic physician (bone specialist) next week as planned. In this case you may have a more severe injury that requires further care.   Return instructions:  Please return if your toes or feet are numb or tingling, appear gray or blue, or you have severe pain (also elevate the leg and loosen splint or wrap if you were given one) Please return to the Emergency Department if you experience worsening symptoms.  Please return if you have any other emergent concerns.  Additional Information:  Your vital signs today were: BP (!) 126/92 (BP Location: Right Arm)   Pulse (!) 107   Temp 98.3 F (36.8 C) (Oral)   Resp 16   Ht '5\' 6"'$  (1.676 m)   Wt 66.2 kg   SpO2 95%   BMI 23.57 kg/m  If your blood pressure (BP) was elevated above 135/85 this visit, please have this repeated by your doctor within one month. --------------

## 2022-01-17 NOTE — Progress Notes (Signed)
Carelink Summary Report / Loop Recorder 

## 2022-01-17 NOTE — ED Provider Notes (Signed)
[facility] EMERGENCY DEPARTMENT Provider Note   CSN: [id number] Arrival date & time: 01/17/22  1142     History  Chief Complaint  Patient presents with   Hip Pain    Katie [name] is a 88 y.o. female.  Patient with history of left hip fracture status post repair IM nail 2021, on anticoagulation -- presents to the emergency department today for evaluation of left hip and thigh pain.  Symptoms started 1 week ago.  No new injuries.  Patient reports pain in the left hip that radiates into the thigh.  No swelling of the leg.  Pain is worse with movement and with walking.  Patient states that she has an appointment to "get a shot in the hip" next week.  Pain has been improved in the past with hip injection.  Pain was too much so she went to see her primary provider 2 days ago.  They switched her from tramadol to hydrocodone/acetaminophen for pain.  She states that she has been trying this without improvement.  No numbness or tingling distally.  Currently she lives at home but her son is around to help her.  She denies back pain, bowel or bladder symptoms.       Home Medications Prior to Admission medications   Medication Sig Start Date End Date Taking? Authorizing Provider  Alum & Mag Hydroxide-Simeth (GI COCKTAIL) SUSP suspension Take 30 mLs by mouth at bedtime. Shake well. 12/03/21   Hassell Done, Mary-Margaret, FNP  benazepril (LOTENSIN) 40 MG tablet Take 1 tablet (40 mg total) by mouth at bedtime. 10/18/21   Shirley Friar, PA-C  Cholecalciferol (VITAMIN D-3) 125 MCG (5000 UT) TABS Take 5,000 Units by mouth daily. 09/28/19   Corinne Ports, PA-C  cloNIDine (CATAPRES) 0.1 MG tablet Take 1 tablet (0.1 mg total) by mouth 2 (two) times daily. 10/03/21   Hassell Done Mary-Margaret, FNP  famotidine (PEPCID) 20 MG tablet Take 1 tablet (20 mg total) by mouth at bedtime. 10/03/21   Hassell Done Mary-Margaret, FNP  feeding supplement, ENSURE ENLIVE, (ENSURE ENLIVE) LIQD Take 237 mLs by mouth  2 (two) times daily between meals. Patient not taking: Reported on 01/15/2022 09/28/19   Modena Jansky, MD  fluticasone (FLONASE) 50 MCG/ACT nasal spray USE 2 SPRAYS IN EACH NOSTRIL ONCE DAILY. 11/11/21   Hassell Done Mary-Margaret, FNP  furosemide (LASIX) 40 MG tablet Take 1 tablet (40 mg total) by mouth daily. 10/03/21   Hassell Done Mary-Margaret, FNP  HYDROcodone-acetaminophen (NORCO) 5-325 MG tablet Take 1 tablet by mouth every 8 (eight) hours as needed for up to 5 days for moderate pain. 01/15/22 01/20/22  Loman Brooklyn, FNP  levothyroxine (SYNTHROID) 75 MCG tablet Take 1 tablet (75 mcg total) by mouth daily. 10/03/21   Hassell Done, Mary-Margaret, FNP  lovastatin (MEVACOR) 40 MG tablet Take 1 tablet (40 mg total) by mouth at bedtime. 10/03/21   Hassell Done Mary-Margaret, FNP  Multiple Vitamin (MULTIVITAMIN WITH MINERALS) TABS tablet Take 1 tablet by mouth daily. 09/29/19   Hongalgi, Lenis Dickinson, MD  pantoprazole (PROTONIX) 40 MG tablet Take 1 tablet (40 mg total) by mouth daily. 10/03/21   Hassell Done, Mary-Margaret, FNP  potassium chloride SA (KLOR-CON M) 20 MEQ tablet Take 1 tablet (20 mEq total) by mouth daily. 10/03/21   Hassell Done Mary-Margaret, FNP  raloxifene (EVISTA) 60 MG tablet Take 1 tablet (60 mg total) by mouth daily. 10/03/21   Hassell Done Mary-Margaret, FNP  Rivaroxaban (XARELTO) 15 MG TABS tablet Take 1 tablet (15 mg total) by mouth daily  with supper. 10/03/21   Hassell Done, Mary-Margaret, FNP  traMADol (ULTRAM) 50 MG tablet TAKE 1 TABLET EVERY 8 HOURS AS NEEDED Patient taking differently: TAKE 1 TABLET BY MOUTH EVERY 8 HOURS AS NEEDED FOR PAIN 10/10/21   [name], FNP      Allergies    Shellfish allergy, Amlodipine, Macrodantin, Metformin and related, and Penicillins    Review of Systems   Review of Systems  Physical Exam Updated Vital Signs BP (!) 126/92 (BP Location: Right Arm)   Pulse (!) 107   Temp 98.3 F (36.8 C) (Oral)   Resp 16   Ht '5\' 6"'$  (1.676 m)   Wt 66.2 kg   SpO2 95%   BMI 23.57  kg/m   Physical Exam Vitals and nursing note reviewed.  Constitutional:      General: She is not in acute distress.    Appearance: She is well-developed.  HENT:     Head: Normocephalic and atraumatic.     Right Ear: External ear normal.     Left Ear: External ear normal.     Nose: Nose normal.  Eyes:     Conjunctiva/sclera: Conjunctivae normal.  Cardiovascular:     Rate and Rhythm: Normal rate and regular rhythm.     Heart sounds: No murmur heard. Pulmonary:     Effort: No respiratory distress.     Breath sounds: No wheezing, rhonchi or rales.  Abdominal:     Palpations: Abdomen is soft.     Tenderness: There is no abdominal tenderness. There is no guarding or rebound.  Musculoskeletal:     Cervical back: Normal range of motion and neck supple.     Lumbar back: No tenderness or bony tenderness. Normal range of motion.     Left hip: Tenderness present. No bony tenderness. Normal range of motion.     Left knee: No bony tenderness. Normal range of motion. Tenderness present.     Right lower leg: No edema.     Left lower leg: No edema.  Skin:    General: Skin is warm and dry.     Findings: No rash.  Neurological:     General: No focal deficit present.     Mental Status: She is alert. Mental status is at baseline.     Motor: No weakness.  Psychiatric:        Mood and Affect: Mood normal.    ED Results / Procedures / Treatments   Labs (all labs ordered are listed, but only abnormal results are displayed) Labs Reviewed - No data to display  EKG None  Radiology DG Hip Unilat With Pelvis 2-3 Views Left  Result Date: 01/17/2022 CLINICAL DATA:  Left hip pain, no reported recent injury EXAM: DG HIP (WITH OR WITHOUT PELVIS) 2-3V LEFT COMPARISON:  02/28/2021 left hip radiographs FINDINGS: Partially visualized intact fixation hardware in proximal left femur with no evidence of hardware loosening. Healed deformity in the proximal left femoral metaphysis. No left hip dislocation.  No pelvic fracture or diastasis. Degenerative changes in the visualized lower lumbar spine. No suspicious focal osseous lesions. IMPRESSION: No acute osseous abnormality. No left hip dislocation. Healed deformity in the proximal left femoral metaphysis with no evidence of hardware complication involving the proximal left femoral fixation hardware. Electronically Signed   By: Ilona Sorrel M.D.   On: 01/17/2022 12:27    Procedures Procedures    Medications Ordered in ED Medications  oxyCODONE-acetaminophen (PERCOCET/ROXICET) 5-325 MG per tablet 1 tablet (1 tablet Oral Given 01/17/22 1449)  dexamethasone (DECADRON) injection 10 mg (10 mg Intramuscular Given 01/17/22 1449)    ED Course/ Medical Decision Making/ A&P    Patient seen and examined. History obtained directly from patient.   Labs/EKG: None ordered  Imaging: Left hip films, personally reviewed and interpreted.  Patient with previous IM nail.  Per report, no evidence of dislocation, acute bony abnormality, or hardware complication.  Medications/Fluids: Ordered p.o. oxycodone/acetaminophen, IM Decadron.  Most recent vital signs reviewed and are as follows: BP (!) 126/92 (BP Location: Right Arm)   Pulse (!) 107   Temp 98.3 F (36.8 C) (Oral)   Resp 16   Ht '5\' 6"'$  (1.676 m)   Wt 66.2 kg   SpO2 95%   BMI 23.57 kg/m   Initial impression: acute on chronic left hip pain  4:03 PM Reassessment performed. Patient appears more comfortable.  She did well with the medication given here.  Plan: Discharge to home.   Prescriptions written for: Oxycodone 5 mg #5.  Patient is requesting something different for pain.  I discussed at length with her that she should avoid combining this with tramadol and the previously prescribed hydrocodone.  She verbalizes understanding.  Use pain medication only under direct supervision at the lowest possible dose needed to control your pain.   Other home care instructions discussed: Rest, ice/heat,  gentle stretching.   ED return instructions discussed: Patient urged to return with worsening symptoms or other concerns. Patient verbalized understanding and agrees with plan.   Follow-up instructions discussed: Patient encouraged to follow-up with their PCP/orthopedist in 3 days.                            Medical Decision Making Amount and/or Complexity of Data Reviewed Radiology: ordered.  Risk Prescription drug management.   Patient with acute on chronic left hip pain.  X-rays negative.  Lower extremity appears normal without signs of swelling.  Low concern for DVT, cellulitis, fasciitis, myositis.  No signs of hematoma.        Final Clinical Impression(s) / ED Diagnoses Final diagnoses:  Pain of left hip    Rx / DC Orders ED Discharge Orders          Ordered    oxyCODONE (OXY IR/ROXICODONE) 5 MG immediate release tablet  Every 6 hours PRN        01/17/22 1543              Carlisle Cater, PA-C 01/17/22 1606    Pattricia Boss, MD 01/22/22 1712

## 2022-01-17 NOTE — ED Triage Notes (Signed)
Pain in left hip that radiates down to left knee.  Reports pain is only when she ambulates x 7 days.

## 2022-01-20 ENCOUNTER — Telehealth: Payer: Self-pay | Admitting: Nurse Practitioner

## 2022-01-20 DIAGNOSIS — M25552 Pain in left hip: Secondary | ICD-10-CM

## 2022-01-21 MED ORDER — HYDROCODONE-ACETAMINOPHEN 5-325 MG PO TABS: 1.0000 | ORAL_TABLET | Freq: Three times a day (TID) | ORAL | 0 refills | Status: DC | PRN

## 2022-01-21 NOTE — Telephone Encounter (Signed)
Additional pain meds sent to pharmacy- please use sparringly Meds ordered this encounter  Medications   HYDROcodone-acetaminophen (NORCO) 5-325 MG tablet    Sig: Take 1 tablet by mouth every 8 (eight) hours as needed for up to 5 days for moderate pain.    Dispense:  15 tablet    Refill:  0    Order Specific Question:   Supervising Provider    Answer:   Worthy Rancher [[id number]]   [place], FNP

## 2022-01-22 ENCOUNTER — Ambulatory Visit: Payer: Self-pay

## 2022-01-22 ENCOUNTER — Encounter: Payer: Self-pay | Admitting: Physical Medicine and Rehabilitation

## 2022-01-22 ENCOUNTER — Ambulatory Visit: Payer: Medicare Other | Admitting: Physical Medicine and Rehabilitation

## 2022-01-22 DIAGNOSIS — M48062 Spinal stenosis, lumbar region with neurogenic claudication: Secondary | ICD-10-CM | POA: Diagnosis not present

## 2022-01-22 DIAGNOSIS — M7062 Trochanteric bursitis, left hip: Secondary | ICD-10-CM | POA: Diagnosis not present

## 2022-01-22 DIAGNOSIS — M5416 Radiculopathy, lumbar region: Secondary | ICD-10-CM

## 2022-01-22 MED ORDER — HYDROCODONE-ACETAMINOPHEN 5-325 MG PO TABS: 1.0000 | ORAL_TABLET | Freq: Three times a day (TID) | ORAL | 0 refills | Status: DC | PRN

## 2022-01-22 NOTE — Progress Notes (Unsigned)
   Katie [name] - 88 y.o. female MRN [id number]  Date of birth: 04/14/1932  Office Visit Note: Visit Date: 01/22/2022 PCP: [name], FNP Referred by: Magnus Sinning, MD  Subjective: Chief Complaint  Patient presents with   Left Hip - Pain   HPI:  Katie [name] is a 88 y.o. female who comes in todayHPI ROS Otherwise per HPI.  Assessment & Plan: Visit Diagnoses: No diagnosis found.  Plan: No additional findings.   Meds & Orders: No orders of the defined types were placed in this encounter.  No orders of the defined types were placed in this encounter.   Follow-up: No follow-ups on file.   Procedures: Large Joint Inj: L greater trochanter on 01/22/2022 10:48 AM Indications: pain and diagnostic evaluation Details: 22 G 3.5 in needle, fluoroscopy-guided lateral approach  Arthrogram: No  Medications: 4 mL lidocaine 2 %; 4 mL bupivacaine 0.25 %; 60 mg triamcinolone acetonide 40 MG/ML Outcome: tolerated well, no immediate complications  There was excellent flow of contrast outlined the greater trochanteric bursa without vascular uptake. Procedure, treatment alternatives, risks and benefits explained, specific risks discussed. Consent was given by the patient. Immediately prior to procedure a time out was called to verify the correct patient, procedure, equipment, support staff and site/side marked as required. Patient was prepped and draped in the usual sterile fashion.         Clinical History: No specialty comments available.     Objective:  VS:  HT:    WT:   BMI:     BP:   HR: bpm  TEMP: ( )  RESP:  Physical Exam   Imaging: No results found.

## 2022-01-22 NOTE — Telephone Encounter (Signed)
Patient aware.

## 2022-01-22 NOTE — Progress Notes (Unsigned)
Pt state left hip pain. Pt state walking and standing makes the pain worse. Pt state she takes over the counter pain meds to help ease her pain.  Numeric Pain Rating Scale and Functional Assessment Average Pain 7   In the last MONTH (on 0-10 scale) has pain interfered with the following?  1. General activity like being  able to carry out your everyday physical activities such as walking, climbing stairs, carrying groceries, or moving a chair?  Rating(9)   +Driver, -BT, -Dye Allergies.

## 2022-01-28 ENCOUNTER — Telehealth: Payer: Self-pay | Admitting: Physical Medicine and Rehabilitation

## 2022-01-28 NOTE — Telephone Encounter (Signed)
Pt's on [facility]ie [facility] called requesting a refill of pt's pain medication and also would like a call back to discuss next steps. Injection did not work as they thought. Please call Randy at [phone].

## 2022-01-29 ENCOUNTER — Encounter: Payer: Self-pay | Admitting: Nurse Practitioner

## 2022-01-29 ENCOUNTER — Other Ambulatory Visit: Payer: Self-pay | Admitting: Physical Medicine and Rehabilitation

## 2022-01-29 ENCOUNTER — Ambulatory Visit (INDEPENDENT_AMBULATORY_CARE_PROVIDER_SITE_OTHER): Payer: Medicare Other | Admitting: Nurse Practitioner

## 2022-01-29 VITALS — BP 188/85 | HR 76 | Temp 97.9°F | Resp 20 | Ht 66.0 in | Wt 143.0 lb

## 2022-01-29 DIAGNOSIS — M25552 Pain in left hip: Secondary | ICD-10-CM

## 2022-01-29 DIAGNOSIS — Z79891 Long term (current) use of opiate analgesic: Secondary | ICD-10-CM | POA: Diagnosis not present

## 2022-01-29 MED ORDER — HYDROCODONE-ACETAMINOPHEN 7.5-325 MG PO TABS: 1.0000 | ORAL_TABLET | Freq: Four times a day (QID) | ORAL | 0 refills | Status: DC | PRN

## 2022-01-29 NOTE — Progress Notes (Signed)
Subjective:    Patient ID: Katie [name], female    DOB: 06-Apr-1932, 88 y.o.   MRN: [id number]   Chief Complaint: hip pain  HPI  Patient has been having increasing hip pain for several weeks now. She has seen orhto and gotten injections and saw B. Joyce,NP and was given norco 5/325 #15. She went to the ED on 01/17/22 with same pain. Hip xraywas negative and she was given roxicodone '5mg'$  tablets. She saw Ortho on 01/22/22 and had joint injection. Today she says her hip is still hurting. Rates pain 10/10 most of the time. Pain worsens with laying or standing. She took some lortab 10/325 that she had left over from husband when pain first started, but she has been out of this for awhille. [place] Controlled Substance Abuse database reviewed- Yes If yes- were their any concerning findings : 0    01/15/2022    1:57 PM 10/03/2021   12:05 PM 08/30/2021    4:47 PM 07/24/2021    3:16 PM 06/11/2021   11:50 AM  Depression screen PHQ 2/9  Decreased Interest 0 0 0 0 0  Down, Depressed, Hopeless 0 0 0 0 0  PHQ - 2 Score 0 0 0 0 0  Altered sleeping  0 0 0 0  Tired, decreased energy  1 0 0   Change in appetite  0 0 0   Feeling bad or failure about yourself   0 0 0   Trouble concentrating  0 0 0   Moving slowly or fidgety/restless  0 0 0   Suicidal thoughts  0 0 0   PHQ-9 Score  1 0 0 0  Difficult doing work/chores  Not difficult at all Not difficult at all Not difficult at all        10/03/2021   12:05 PM 07/24/2021    3:16 PM 06/07/2021   11:07 AM 04/02/2021   11:39 AM  GAD 7 : Generalized Anxiety Score  Nervous, Anxious, on Edge 0 0 0 0  Control/stop worrying 0 0 0 0  Worry too much - different things 0 0 0 0  Trouble relaxing 0 0 0 0  Restless 0 0 0 0  Easily annoyed or irritable 0 0 0 0  Afraid - awful might happen 0 0 0 0  Total GAD 7 Score 0 0 0 0  Anxiety Difficulty Not difficult at all Not difficult at all Not difficult at all Not difficult at all       [facility] drug  screen performed- Yes  SOAPP  0= never  1= seldom  2=sometimes  3= often  4= very often  How often do you have mood swings? 0 How often do you smoke a cigarette within an hour after waling up? 0 How often have you taken medication other than the way that it was prescribed?0 How often have you used illegal drugs in the past 5 years? 0 How often, in your lifetime, have you had legal problems or been arrested? 0  Score 0  Alcohol Audit - How often during the last year have found that you: 0-Never   1- Less than monthly   2- Monthly     3-Weekly     4-daily or almost daily  - found that you were not able to stop drinking once you started- 0 -failed to do what was normally expected of you because of drinking- 0 -needed a first drink in the morning- 0 -had a feeling of  guilt or remorse after drinking- 0 -are/were unable to remember what happened the night before because of your drinking- 0  0- NO   2- yes but not in last year  4- yes during last year -Have you or someone else been injured because of your drinking- 0 - Has anyone been concerned about your drinking or suggested you cut down- 0        TOTAL- 0  ( 0-7- alcohol education, 8-15- simple advice, 16-19 simple advice plus counseling, 20-40 referral for evaluation and treatment )      No data to display             [facility]  Pain assessment: Cause of pain- old hip fracture Pain location- left hip Pain on scale of 1-10- 10/10 Frequency- daily What increases pain-activity What makes pain Better-sitting helps some Effects on ADL - none  Prior treatments tried and failed- norco 2.5/325 Current opioids rx- will do new meds # prescribed- #60 Morphine mg equivalent- 30MME  Pain management agreement reviewed and signed- Yes    Review of Systems  Musculoskeletal:  Positive for arthralgias (left hip).       Objective:   Physical Exam Vitals reviewed.  Constitutional:      Appearance:  Normal appearance.  Cardiovascular:     Rate and Rhythm: Normal rate and regular rhythm.     Heart sounds: Normal heart sounds.  Pulmonary:     Effort: Pulmonary effort is normal.     Breath sounds: Normal breath sounds.  Musculoskeletal:     Comments: Walking with walker (-) SLR bil  Skin:    General: Skin is warm.  Neurological:     General: No focal deficit present.     Mental Status: She is alert and oriented to person, place, and time.  Psychiatric:        Mood and Affect: Mood normal.        Behavior: Behavior normal.    BP (!) 188/85   Pulse 76   Temp 97.9 F (36.6 C) (Temporal)   Resp 20   Ht '5\' 6"'$  (1.676 m)   Wt 143 lb (64.9 kg)   SpO2 94%   BMI 23.08 kg/m         Assessment & Plan:   Katie [name] in today with chief complaint of Left hip and back pain   1. Left hip pain Discussed pain  meds in elderly with her son Concerns discussed Keep appointment for next injection in July. Moist heat - HYDROcodone-acetaminophen (NORCO) 7.5-325 MG tablet; Take 1 tablet by mouth every 6 (six) hours as needed for moderate pain.  Dispense: 60 tablet; Refill: 0 - ToxASSU[facility] Select 13 (MW), Urine    The above assessment and management plan was discussed with the patient. The patient verbalized understanding of and has agreed to the management plan. Patient is aware to call the clinic if symptoms persist or worsen. Patient is aware when to return to the clinic for a follow-up visit. Patient educated on when it is appropriate to go to the emergency department.   Mary-Margaret Hassell Done, FNP

## 2022-01-29 NOTE — Patient Instructions (Signed)
Hip Pain The hip is the joint between the upper legs and the lower pelvis. The bones, cartilage, tendons, and muscles of your hip joint support your body and allow you to move around. Hip pain can range from a minor ache to severe pain in one or both of your hips. The pain may be felt on the inside of the hip joint near the groin, or on the outside near the buttocks and upper thigh. You may also have swelling or stiffness in your hip area. Follow these instructions at home: Managing pain, stiffness, and swelling     If directed, put ice on the painful area. To do this: Put ice in a plastic bag. Place a towel between your skin and the bag. Leave the ice on for 20 minutes, 2-3 times a day. If directed, apply heat to the affected area as often as told by your health care provider. Use the heat source that your health care provider recommends, such as a moist heat pack or a heating pad. Place a towel between your skin and the heat source. Leave the heat on for 20-30 minutes. Remove the heat if your skin turns bright red. This is especially important if you are unable to feel pain, heat, or cold. You may have a greater risk of getting burned. Activity Do exercises as told by your health care provider. Avoid activities that cause pain. General instructions  Take over-the-counter and prescription medicines only as told by your health care provider. Keep a journal of your symptoms. Write down: How often you have hip pain. The location of your pain. What the pain feels like. What makes the pain worse. Sleep with a pillow between your legs on your most comfortable side. Keep all follow-up visits as told by your health care provider. This is important. Contact a health care provider if: You cannot put weight on your leg. Your pain or swelling continues or gets worse after one week. It gets harder to walk. You have a fever. Get help right away if: You fall. You have a sudden increase in pain  and swelling in your hip. Your hip is red or swollen or very tender to touch. Summary Hip pain can range from a minor ache to severe pain in one or both of your hips. The pain may be felt on the inside of the hip joint near the groin, or on the outside near the buttocks and upper thigh. Avoid activities that cause pain. Write down how often you have hip pain, the location of the pain, what makes it worse, and what it feels like. This information is not intended to replace advice given to you by your health care provider. Make sure you discuss any questions you have with your health care provider. Document Revised: 12/20/2018 Document Reviewed: 12/20/2018 Elsevier Patient Education  2023 [employer] Inc.  

## 2022-01-29 NOTE — Progress Notes (Signed)
Spoke with patients son Louie Casa) via telephone. Son informed that she has lumbar epidural steroid injection scheduled on 02/26/2022, we are happy to place injection order with [facility] if she needs injection before appointment. I did speak with son about pain medication, she recently had prescription for Norco filled on 01/27/2022, we are unable to refill at this time. Patient will need to contact PCP.

## 2022-02-03 ENCOUNTER — Ambulatory Visit (INDEPENDENT_AMBULATORY_CARE_PROVIDER_SITE_OTHER): Payer: Medicare Other

## 2022-02-03 DIAGNOSIS — I495 Sick sinus syndrome: Secondary | ICD-10-CM | POA: Diagnosis not present

## 2022-02-04 LAB — CUP PACEART REMOTE DEVICE CHECK
Date Time Interrogation Session: 20230612231940
Implantable Pulse Generator Implant Date: 20200819

## 2022-02-04 LAB — TOXASSURE SELECT 13 (MW), URINE

## 2022-02-07 ENCOUNTER — Encounter: Payer: Self-pay | Admitting: Physical Medicine and Rehabilitation

## 2022-02-07 ENCOUNTER — Ambulatory Visit (INDEPENDENT_AMBULATORY_CARE_PROVIDER_SITE_OTHER): Payer: Medicare Other | Admitting: Family Medicine

## 2022-02-07 ENCOUNTER — Encounter: Payer: Self-pay | Admitting: Family Medicine

## 2022-02-07 VITALS — BP 153/67 | HR 85 | Temp 97.8°F | Ht 66.0 in | Wt 144.0 lb

## 2022-02-07 DIAGNOSIS — R300 Dysuria: Secondary | ICD-10-CM

## 2022-02-07 LAB — URINALYSIS, ROUTINE W REFLEX MICROSCOPIC
Bilirubin, UA: NEGATIVE
Glucose, UA: NEGATIVE
Ketones, UA: NEGATIVE
Nitrite, UA: NEGATIVE
Protein,UA: NEGATIVE
Specific Gravity, UA: 1.01 (ref 1.005–1.030)
Urobilinogen, Ur: 0.2 mg/dL (ref 0.2–1.0)
pH, UA: 6 (ref 5.0–7.5)

## 2022-02-07 LAB — MICROSCOPIC EXAMINATION

## 2022-02-07 MED ORDER — TRIAMCINOLONE ACETONIDE 40 MG/ML IJ SUSP
60.0000 mg | INTRAMUSCULAR | Status: AC | PRN
  Administered 2022-01-22: 60 mg via INTRA_ARTICULAR

## 2022-02-07 MED ORDER — BUPIVACAINE HCL 0.25 % IJ SOLN
4.0000 mL | INTRAMUSCULAR | Status: AC | PRN
  Administered 2022-01-22: 4 mL via INTRA_ARTICULAR

## 2022-02-07 MED ORDER — PHENAZOPYRIDINE HCL 95 MG PO TABS: 95.0000 mg | ORAL_TABLET | Freq: Three times a day (TID) | ORAL | 0 refills | Status: DC | PRN

## 2022-02-07 MED ORDER — DOXYCYCLINE HYCLATE 100 MG PO TABS: 100.0000 mg | ORAL_TABLET | Freq: Two times a day (BID) | ORAL | 0 refills | Status: AC

## 2022-02-07 MED ORDER — LIDOCAINE HCL 2 % IJ SOLN
4.0000 mL | INTRAMUSCULAR | Status: AC | PRN
  Administered 2022-01-22: 4 mL

## 2022-02-08 LAB — URINE CULTURE

## 2022-02-21 ENCOUNTER — Encounter: Payer: Self-pay | Admitting: Physician Assistant

## 2022-02-21 ENCOUNTER — Ambulatory Visit (INDEPENDENT_AMBULATORY_CARE_PROVIDER_SITE_OTHER): Payer: Medicare Other | Admitting: Physician Assistant

## 2022-02-21 VITALS — BP 129/67 | HR 96 | Temp 97.9°F | Ht 66.0 in | Wt 142.2 lb

## 2022-02-21 DIAGNOSIS — R1013 Epigastric pain: Secondary | ICD-10-CM

## 2022-02-21 NOTE — Patient Instructions (Signed)
Indigestion Indigestion is a feeling of pain, discomfort, burning, or fullness in the upper part of your belly (abdomen). It can come and go. It may happen often or rarely. Indigestion tends to happen while you are eating or right after you have finished eating. It may be worse: At night. When bending over. While lying down. Indigestion may be a symptom of another condition. Follow these instructions at home: Eating and drinking  Follow an eating plan as told by your doctor. You may need to avoid foods and drinks such as: Chocolate and cocoa. Peppermint and mint flavorings. Garlic and onions. Horseradish. Spicy and acidic foods, such as: Peppers. Chili powder and curry powder. Vinegar. Hot sauces and BBQ sauce. Citrus fruits, such as: Oranges. Lemons. Limes. Tomato-based foods, such as: Red sauce and pizza with red sauce. Chili. Salsa. Fried and fatty foods, such as: Donuts. [place] fries and potato chips. High-fat dressings. High-fat meats, such as: Hot dogs and sausage. Rib eye steak. Ham and bacon. High-fat dairy items, such as: Whole milk. Butter. Cream cheese. Coffee and tea, with or without caffeine. Drinks that contain alcohol. Energy drinks and sports drinks. Carbonated drinks or sodas. Citrus fruit juices. Eat small meals often. Avoid eating large meals. Avoid drinking large amounts of liquid with your meals. Avoid eating meals during the 2-3 hours before bedtime. Avoid lying down right after you eat. Avoid exercise for 2 hours after you eat. Lifestyle     Maintain a healthy weight. Ask your doctor what weight is healthy for you. If you need to lose weight, work with your doctor. Exercise for at least 30 minutes on 5 or more days each week, or as told by your doctor. Avoid exercises that include bending forward. This can make your symptoms worse. Wear loose clothes. Do not wear anything tight around your waist. Do not smoke or use any products that  contain nicotine or tobacco. These can make your symptoms worse. If you need help quitting, ask your doctor. Raise (elevate) the head of your bed about 6 inches (15 cm) when you sleep. You can use a wedge to do this. Try to lower your stress. If you need help doing this, ask your doctor. General instructions Take over-the-counter and prescription medicines only as told by your doctor. Do not take aspirin or NSAIDs, such as ibuprofen, unless your doctor says it is okay. Watch for any changes in your symptoms. Keep all follow-up visits. Contact a doctor if: You have new symptoms. You lose weight and you do not know why. You have trouble swallowing, or it hurts to swallow. Your symptoms do not get better with treatment. Your symptoms last for more than 2 days. You have a fever. You vomit. Get help right away if: You have pain in your arms, neck, jaw, teeth, or back all of a sudden. You feel sweaty, dizzy, or light-headed all of a sudden. You faint. You have chest pain or shortness of breath. You cannot stop vomiting, or you vomit blood. Your poop (stool) is bloody or black. You have very bad pain in your belly. These symptoms may be an emergency. Get help right away. Call your local emergency services (911 in the [place] Do not wait to see if the symptoms will go away. Do not drive yourself to the hospital. Summary Indigestion is a feeling of pain, discomfort, burning, or fullness in the upper part of your belly. It tends to happen while you are eating or right after you have finished eating. Follow  an eating plan and other lifestyle changes as told by your doctor. Take over-the-counter and prescription medicines only as told by your doctor. Do not take aspirin or NSAIDs, such as ibuprofen, unless your doctor says it is okay. Contact your doctor if your symptoms do not get better or they get worse. Some symptoms may represent a serious problem that is an emergency. Do not wait to see if  the symptoms will go away. Get medical help right away. This information is not intended to replace advice given to you by your health care provider. Make sure you discuss any questions you have with your health care provider. Document Revised: 02/08/2020 Document Reviewed: 02/08/2020 Elsevier Patient Education  [facility].

## 2022-02-21 NOTE — Progress Notes (Signed)
  Subjective:     Patient ID: Katie [name], female   DOB: 07-22-1932, 88 y.o.   MRN: [id number]  Heartburn She complains of abdominal pain and heartburn. She reports no nausea.   Pt here due to dyspepsia Long hx of same States sx worse over the last week and keeping her up at night She denies any recent change in her meds Using her Pepcid, Protonix, and GI cocktail Hx of prev esoph. dilation   Review of Systems  Constitutional: Negative.   Respiratory: Negative.    Cardiovascular: Negative.   Gastrointestinal:  Positive for abdominal pain and heartburn. Negative for abdominal distention, constipation, diarrhea, nausea and vomiting.       Objective:   Physical Exam Nursing note reviewed.  Constitutional:      General: She is not in acute distress.    Appearance: Normal appearance. She is normal weight. She is not ill-appearing, toxic-appearing or diaphoretic.  HENT:     Mouth/Throat:     Mouth: Mucous membranes are moist.     Pharynx: Oropharynx is clear.  Neck:     Vascular: No carotid bruit.  Cardiovascular:     Rate and Rhythm: Normal rate. Rhythm irregular.     Pulses: Normal pulses.  Pulmonary:     Effort: Pulmonary effort is normal.     Breath sounds: Normal breath sounds.  Abdominal:     General: There is no distension.     Palpations: Abdomen is soft. There is no mass.     Tenderness: There is no abdominal tenderness. There is no guarding or rebound.  Neurological:     Mental Status: She is alert.        Assessment:    1. Dyspepsia        Plan:     Chart review I found pt just on doxycycline for urinary sx Reviewed with her that could cause GI upset Continue with current meds Avoid GI irritants Given sig  hx recommended referral Pt prefers the [place] [facility] F/U here prn [facility]

## 2022-02-25 ENCOUNTER — Telehealth: Payer: Self-pay | Admitting: Nurse Practitioner

## 2022-02-25 NOTE — Progress Notes (Signed)
Carelink Summary Report / Loop Recorder 

## 2022-02-26 ENCOUNTER — Ambulatory Visit: Payer: Medicare Other | Admitting: Physical Medicine and Rehabilitation

## 2022-02-26 ENCOUNTER — Ambulatory Visit: Payer: Self-pay

## 2022-02-26 ENCOUNTER — Encounter: Payer: Self-pay | Admitting: Physical Medicine and Rehabilitation

## 2022-02-26 DIAGNOSIS — M5416 Radiculopathy, lumbar region: Secondary | ICD-10-CM | POA: Diagnosis not present

## 2022-02-26 MED ORDER — METHYLPREDNISOLONE ACETATE 80 MG/ML IJ SUSP
80.0000 mg | Freq: Once | INTRAMUSCULAR | Status: AC
  Administered 2022-02-26: 80 mg

## 2022-02-26 NOTE — Patient Instructions (Signed)

## 2022-02-26 NOTE — Progress Notes (Signed)
Pt state lower back pain that travels to her buttock and left hip. Pt state laying down, walking and standing makes the pain worse. Pt state she takes over the counter pain meds to help ease her pain.   Numeric Pain Rating Scale and Functional Assessment Average Pain 7   In the last MONTH (on 0-10 scale) has pain interfered with the following?  1. General activity like being  able to carry out your everyday physical activities such as walking, climbing stairs, carrying groceries, or moving a chair?  Rating(9)   +Driver, +BT, -Dye Allergies.

## 2022-03-05 DIAGNOSIS — C44329 Squamous cell carcinoma of skin of other parts of face: Secondary | ICD-10-CM | POA: Diagnosis not present

## 2022-03-10 ENCOUNTER — Ambulatory Visit (INDEPENDENT_AMBULATORY_CARE_PROVIDER_SITE_OTHER): Payer: Medicare Other

## 2022-03-10 DIAGNOSIS — I495 Sick sinus syndrome: Secondary | ICD-10-CM

## 2022-03-10 LAB — CUP PACEART REMOTE DEVICE CHECK
Date Time Interrogation Session: 20230715232210
Implantable Pulse Generator Implant Date: 20200819

## 2022-03-13 NOTE — Progress Notes (Signed)
Katie [name] - 88 y.o. female MRN [id number]  Date of birth: April 06, 1932  Office Visit Note: Visit Date: 02/26/2022 PCP: [name], FNP Referred by: Hassell Done, Mary-Margaret, *  Subjective: Chief Complaint  Patient presents with   Lower Back - Pain   Left Hip - Pain   HPI:  Katie [name] is a 88 y.o. female who comes in today at the request of Dr. Basil Dess and Barnet Pall, FNP for planned Left L3-4 Lumbar Transforaminal epidural steroid injection with fluoroscopic guidance.  The patient has failed conservative care including home exercise, medications, time and activity modification.  This injection will be diagnostic and hopefully therapeutic.  Please see requesting physician notes for further details and justification.  MRI reviewed again today and reviewed with the notes below.  She has multilevel severe spinal canal stenosis.  Unfortunately the patient is a very poor historian and has some difficulty grasping the idea of where her left hip pain is coming from.  In the past on occasion she has had good relief with greater trochanteric injection temporarily.  She does have severe lumbar stenosis which I think is causing a lot of her symptoms.  Try to explain this to her today.  Her biggest complaint is nothing helping in terms of pain medication etc. she is taking hydrocodone 7.5 mg with seemingly no benefit.  She perseverates on what Nicki Reaper happened to her if the pain just will not go away and we try to talk at length about this.  I encouraged her to follow-up with her primary care physician as well as she may benefit from comprehensive pain management with pain psychology help for coping strategies.  Depending on relief today with epidural injection we can make some determinations on neck step.  She has not followed up with Dr. Louanne Skye in some time.   ROS Otherwise per HPI.  Assessment & Plan: Visit Diagnoses:    ICD-10-CM   1. Lumbar radiculopathy  M54.16 XR C-ARM NO REPORT     Epidural Steroid injection    methylPREDNISolone acetate (DEPO-MEDROL) injection 80 mg      Plan: No additional findings.   Meds & Orders:  Meds ordered this encounter  Medications   methylPREDNISolone acetate (DEPO-MEDROL) injection 80 mg    Orders Placed This Encounter  Procedures   XR C-ARM NO REPORT   Epidural Steroid injection    Follow-up: Return if symptoms worsen or fail to improve.   Procedures: No procedures performed  Lumbosacral Transforaminal Epidural Steroid Injection - Sub-Pedicular Approach with Fluoroscopic Guidance  Patient: Katie [name]      Date of Birth: 1931-10-03 MRN: [id number] PCP: [name], FNP      Visit Date: 02/26/2022   Universal Protocol:    Date/Time: 02/26/2022  Consent Given By: the patient  Position: PRONE  Additional Comments: Vital signs were monitored before and after the procedure. Patient was prepped and draped in the usual sterile fashion. The correct patient, procedure, and site was verified.   Injection Procedure Details:   Procedure diagnoses: Lumbar radiculopathy [M54.16]    Meds Administered:  Meds ordered this encounter  Medications   methylPREDNISolone acetate (DEPO-MEDROL) injection 80 mg    Laterality: Left  Location/Site: L3  Needle:5.0 in., 22 ga.  Short bevel or Quincke spinal needle  Needle Placement: Transforaminal  Findings:    -Comments: Excellent flow of contrast along the nerve, nerve root and into the epidural space.  Procedure Details: After squaring off the end-plates to get a  true AP view, the C-arm was positioned so that an oblique view of the foramen as noted above was visualized. The target area is just inferior to the "nose of the scotty dog" or sub pedicular. The soft tissues overlying this structure were infiltrated with 2-3 ml. of 1% Lidocaine without Epinephrine.  The spinal needle was inserted toward the target using a "trajectory" view along the fluoroscope beam.   Under AP and lateral visualization, the needle was advanced so it did not puncture dura and was located close the 6 O'Clock position of the pedical in AP tracterory. Biplanar projections were used to confirm position. Aspiration was confirmed to be negative for CSF and/or blood. A 1-2 ml. volume of Isovue-250 was injected and flow of contrast was noted at each level. Radiographs were obtained for documentation purposes.   After attaining the desired flow of contrast documented above, a 0.5 to 1.0 ml test dose of 0.25% Marcaine was injected into each respective transforaminal space.  The patient was observed for 90 seconds post injection.  After no sensory deficits were reported, and normal lower extremity motor function was noted,   the above injectate was administered so that equal amounts of the injectate were placed at each foramen (level) into the transforaminal epidural space.   Additional Comments:  The patient tolerated the procedure well Dressing: 2 x 2 sterile gauze and Band-Aid    Post-procedure details: Patient was observed during the procedure. Post-procedure instructions were reviewed.  Patient left the clinic in stable condition.    Clinical History: MRI LUMBAR SPINE WITHOUT CONTRAST   TECHNIQUE: Multiplanar, multisequence MR imaging of the lumbar spine was performed. No intravenous contrast was administered.   COMPARISON:  CT lumbar spine 04/13/2015.   FINDINGS: Segmentation:  Standard.   Alignment:  Convex right scoliosis noted.   Vertebrae: No acute abnormality or worrisome marrow lesion. Multiple Schmorl's nodes are seen. Mild, remote superior endplate compression fracture of T12 is unchanged.   Conus medullaris and cauda equina: Conus extends to the L1-2 level. Conus and cauda equina appear normal.   Paraspinal and other soft tissues: Right renal cyst is identified.   Disc levels:   T11-12 is imaged in the sagittal plane only. There is a minimal bulge  without stenosis.   T12-L1: Mild-to-moderate facet degenerative change. Otherwise negative.   L1-2: Shallow disc bulge to the right and mild ligamentum flavum thickening. Mild facet arthropathy. No stenosis.   L2-3: Loss of disc space height with a shallow bulge and superimposed left subarticular recess and foraminal protrusion. There is mild to moderate central canal stenosis. Moderate narrowing in the left subarticular recess and foramen is also identified. The right foramen is open.   L3-4: Ligamentum flavum thickening and a broad-based disc bulge cause moderately severe central canal stenosis and right worse than left subarticular recess narrowing. Mild to moderate foraminal narrowing is worse on the right.   L4-5: Bulky ligamentum flavum thickening, broad-based disc bulge and facet degenerative disease worse on the right where there is marrow edema in the right pedicles consistent with secondary stress change. There is moderately severe to severe central canal stenosis and marked narrowing in the right subarticular recess and foramen. Mild to moderate left foraminal narrowing is present.   L5-S1: Shallow disc bulge with facet degenerative disease. No stenosis.   IMPRESSION: No acute abnormality.  Remote T12 compression fracture is unchanged.   Spondylosis worst at L4-5 where there is moderately severe to severe central canal stenosis and marked narrowing in  the right subarticular recess and foramen with encroachment on the exiting right L4 and descending right L5 roots.   Moderately severe central canal stenosis and right worse than left subarticular recess narrowing at L3-4.   Moderate narrowing in the left subarticular recess and foramen at L2-3 where there is mild to moderate central canal stenosis overall.     Electronically Signed   By: Inge Rise M.D.   On: 05/18/2019 09:28     Objective:  VS:  HT:    WT:   BMI:     BP:   HR: bpm  TEMP: ( )   RESP:  Physical Exam Vitals and nursing note reviewed.  Constitutional:      General: She is not in acute distress.    Appearance: Normal appearance. She is not ill-appearing.  HENT:     Head: Normocephalic and atraumatic.     Right Ear: External ear normal.     Left Ear: External ear normal.  Eyes:     Extraocular Movements: Extraocular movements intact.  Cardiovascular:     Rate and Rhythm: Normal rate.     Pulses: Normal pulses.  Pulmonary:     Effort: Pulmonary effort is normal. No respiratory distress.  Abdominal:     General: There is no distension.     Palpations: Abdomen is soft.  Musculoskeletal:        General: Tenderness present.     Cervical back: Neck supple.     Right lower leg: No edema.     Left lower leg: No edema.     Comments: Patient has good distal strength with pain over the left greater trochanter.  No clonus or focal weakness.  She does ambulate with walker.  She has no pain with hip rotation.  She does have pain with standing and ambulating.  She has negative slump test.  Skin:    Findings: No erythema, lesion or rash.  Neurological:     General: No focal deficit present.     Mental Status: She is alert and oriented to person, place, and time.     Sensory: No sensory deficit.     Motor: No weakness or abnormal muscle tone.     Coordination: Coordination normal.     Gait: Gait abnormal.  Psychiatric:        Mood and Affect: Mood normal.        Behavior: Behavior normal.      Imaging: No results found.

## 2022-03-13 NOTE — Procedures (Signed)
Lumbosacral Transforaminal Epidural Steroid Injection - Sub-Pedicular Approach with Fluoroscopic Guidance  Patient: Katie [name]      Date of Birth: 1932-02-17 MRN: [id number] PCP: [name], FNP      Visit Date: 02/26/2022   Universal Protocol:    Date/Time: 02/26/2022  Consent Given By: the patient  Position: PRONE  Additional Comments: Vital signs were monitored before and after the procedure. Patient was prepped and draped in the usual sterile fashion. The correct patient, procedure, and site was verified.   Injection Procedure Details:   Procedure diagnoses: Lumbar radiculopathy [M54.16]    Meds Administered:  Meds ordered this encounter  Medications   methylPREDNISolone acetate (DEPO-MEDROL) injection 80 mg    Laterality: Left  Location/Site: L3  Needle:5.0 in., 22 ga.  Short bevel or Quincke spinal needle  Needle Placement: Transforaminal  Findings:    -Comments: Excellent flow of contrast along the nerve, nerve root and into the epidural space.  Procedure Details: After squaring off the end-plates to get a true AP view, the C-arm was positioned so that an oblique view of the foramen as noted above was visualized. The target area is just inferior to the "nose of the scotty dog" or sub pedicular. The soft tissues overlying this structure were infiltrated with 2-3 ml. of 1% Lidocaine without Epinephrine.  The spinal needle was inserted toward the target using a "trajectory" view along the fluoroscope beam.  Under AP and lateral visualization, the needle was advanced so it did not puncture dura and was located close the 6 O'Clock position of the pedical in AP tracterory. Biplanar projections were used to confirm position. Aspiration was confirmed to be negative for CSF and/or blood. A 1-2 ml. volume of Isovue-250 was injected and flow of contrast was noted at each level. Radiographs were obtained for documentation purposes.   After attaining the desired  flow of contrast documented above, a 0.5 to 1.0 ml test dose of 0.25% Marcaine was injected into each respective transforaminal space.  The patient was observed for 90 seconds post injection.  After no sensory deficits were reported, and normal lower extremity motor function was noted,   the above injectate was administered so that equal amounts of the injectate were placed at each foramen (level) into the transforaminal epidural space.   Additional Comments:  The patient tolerated the procedure well Dressing: 2 x 2 sterile gauze and Band-Aid    Post-procedure details: Patient was observed during the procedure. Post-procedure instructions were reviewed.  Patient left the clinic in stable condition.

## 2022-03-18 DIAGNOSIS — C44321 Squamous cell carcinoma of skin of nose: Secondary | ICD-10-CM | POA: Diagnosis not present

## 2022-03-18 DIAGNOSIS — D485 Neoplasm of uncertain behavior of skin: Secondary | ICD-10-CM | POA: Diagnosis not present

## 2022-03-25 ENCOUNTER — Ambulatory Visit: Payer: Medicare Other | Admitting: Orthopaedic Surgery

## 2022-04-01 ENCOUNTER — Ambulatory Visit (INDEPENDENT_AMBULATORY_CARE_PROVIDER_SITE_OTHER): Payer: Medicare Other

## 2022-04-01 ENCOUNTER — Ambulatory Visit: Payer: Medicare Other | Admitting: Orthopaedic Surgery

## 2022-04-01 ENCOUNTER — Encounter: Payer: Self-pay | Admitting: Orthopaedic Surgery

## 2022-04-01 VITALS — BP 181/70 | HR 65 | Ht 66.0 in | Wt 140.0 lb

## 2022-04-01 DIAGNOSIS — M545 Low back pain, unspecified: Secondary | ICD-10-CM

## 2022-04-01 DIAGNOSIS — M25552 Pain in left hip: Secondary | ICD-10-CM

## 2022-04-01 NOTE — Progress Notes (Signed)
Office Visit Note   Patient: Katie [name]           Date of Birth: 1932/05/11           MRN: [id number] Visit Date: 04/01/2022              Requested by: [name], [address] [place],  [place] [address] PCP: [name], FNP   Assessment & Plan: Visit Diagnoses:  1. Acute left-sided low back pain, unspecified whether sciatica present   2. Pain in left hip     Plan: We reviewed MRI scan which is over-year-old but she did have stenosis at L4-5 with moderate to severe central stenosis at L4-5.  She can continue walking take calcium plus vitamin D and will call if she has recurrence of symptoms  Follow-Up Instructions: No follow-ups on file.   Orders:  Orders Placed This Encounter  Procedures   XR Lumbar Spine 2-3 Views   XR HIP UNILAT W OR W/O PELVIS 2-3 VIEWS LEFT   No orders of the defined types were placed in this encounter.     Procedures: No procedures performed   Clinical Data: No additional findings.   Subjective: Chief Complaint  Patient presents with   Lower Back - Pain   Left Hip - Pain    HPI 88 year old female had previous injection by Dr. Ernestina Patches last month 02/26/2022 with great relief of back and left hip pain and she states almost complete relief of her symptoms.  She is concerned that she hurt for about 4 weeks for she can get in for the injection get it approved and get scheduled.  She wants to schedule another injection even though at this point she is not having hardly any symptoms.  She does take vitamin D she needs to add calcium.  Review of Systems history of heart failure hypertension left hip fracture osteopenia.   Objective: Vital Signs: BP (!) 181/70   Pulse 65   Ht '5\' 6"'$  (1.676 m)   Wt 140 lb (63.5 kg)   BMI 22.60 kg/m   Physical Exam Constitutional:      Appearance: She is well-developed.  HENT:     Head: Normocephalic.     Right Ear: External ear normal.     Left Ear: External ear normal.  There is no impacted cerumen.  Eyes:     Pupils: Pupils are equal, round, and reactive to light.  Neck:     Thyroid: No thyromegaly.     Trachea: No tracheal deviation.  Cardiovascular:     Rate and Rhythm: Normal rate.  Pulmonary:     Effort: Pulmonary effort is normal.  Abdominal:     Palpations: Abdomen is soft.  Musculoskeletal:     Cervical back: No rigidity.  Skin:    General: Skin is warm and dry.  Neurological:     Mental Status: She is alert and oriented to person, place, and time.  Psychiatric:        Behavior: Behavior normal.     Ortho Exam minimal trochanteric bursal tenderness no sciatic notch tenderness.  She ambulates with a walker balance is fair.  Specialty Comments:  MRI LUMBAR SPINE WITHOUT CONTRAST   TECHNIQUE: Multiplanar, multisequence MR imaging of the lumbar spine was performed. No intravenous contrast was administered.   COMPARISON:  CT lumbar spine 04/13/2015.   FINDINGS: Segmentation:  Standard.   Alignment:  Convex right scoliosis noted.   Vertebrae: No acute abnormality or worrisome marrow lesion.  Multiple Schmorl's nodes are seen. Mild, remote superior endplate compression fracture of T12 is unchanged.   Conus medullaris and cauda equina: Conus extends to the L1-2 level. Conus and cauda equina appear normal.   Paraspinal and other soft tissues: Right renal cyst is identified.   Disc levels:   T11-12 is imaged in the sagittal plane only. There is a minimal bulge without stenosis.   T12-L1: Mild-to-moderate facet degenerative change. Otherwise negative.   L1-2: Shallow disc bulge to the right and mild ligamentum flavum thickening. Mild facet arthropathy. No stenosis.   L2-3: Loss of disc space height with a shallow bulge and superimposed left subarticular recess and foraminal protrusion. There is mild to moderate central canal stenosis. Moderate narrowing in the left subarticular recess and foramen is also identified.  The right foramen is open.   L3-4: Ligamentum flavum thickening and a broad-based disc bulge cause moderately severe central canal stenosis and right worse than left subarticular recess narrowing. Mild to moderate foraminal narrowing is worse on the right.   L4-5: Bulky ligamentum flavum thickening, broad-based disc bulge and facet degenerative disease worse on the right where there is marrow edema in the right pedicles consistent with secondary stress change. There is moderately severe to severe central canal stenosis and marked narrowing in the right subarticular recess and foramen. Mild to moderate left foraminal narrowing is present.   L5-S1: Shallow disc bulge with facet degenerative disease. No stenosis.   IMPRESSION: No acute abnormality.  Remote T12 compression fracture is unchanged.   Spondylosis worst at L4-5 where there is moderately severe to severe central canal stenosis and marked narrowing in the right subarticular recess and foramen with encroachment on the exiting right L4 and descending right L5 roots.   Moderately severe central canal stenosis and right worse than left subarticular recess narrowing at L3-4.   Moderate narrowing in the left subarticular recess and foramen at L2-3 where there is mild to moderate central canal stenosis overall.     Electronically Signed   By: Inge Rise M.D.   On: 05/18/2019 09:28  Imaging: No results found.   PMFS History: Patient Active Problem List   Diagnosis Date Noted   Breakage of internal fixation device in bone ([facility]) 11/22/2019   Displaced intertrochanteric fracture of left femur, initial encounter for closed fracture ([facility]) 09/22/2019   Hypomagnesemia 08/12/2019   Regurgitation of food 07/20/2019   Chronic midline low back pain without sciatica 10/22/2018   Primary insomnia 10/22/2018   Persistent atrial fibrillation (HCC) 04/27/2017   Chronic diastolic CHF (congestive heart failure) ([facility]) 04/27/2017    CKD (chronic kidney disease) stage 3, GFR 30-59 ml/min (HCC) 05/14/2015   BMI 26.0-26.9,adult 05/11/2015   Peripheral edema 07/06/2014   Hypokalemia 07/22/2013   Hypothyroidism 07/22/2013   Osteopenia, senile 03/09/2013   Constipation 01/04/2013   Hypertension 05/07/2012   Hyperlipidemia 05/07/2012   Diabetes mellitus type 2, diet-controlled ([facility]) 05/07/2012   GERD (gastroesophageal reflux disease) 05/07/2012   Past Medical History:  Diagnosis Date   Anxiety    Aortic insufficiency    a. Trivial AI by echo 02/2016.   Atrial fibrillation and flutter ([facility])    a. Coarse afib vs flutter by EKG 12/2015.   Cancer ([facility])    skin cancer   Cataract    Chronic diastolic CHF (congestive heart failure) (HCC)    CKD (chronic kidney disease), stage III (HCC)    COVID-19    GERD (gastroesophageal reflux disease)    Hiatal hernia  Hypercholesterolemia    Hypertension    Hypokalemia    Hypothyroidism    Left knee pain    NIDDM (non-insulin dependent diabetes mellitus)    diet controlled    Osteoporosis    Premature atrial contractions    PVC's (premature ventricular contractions)    Vitamin D deficiency     Family History  Problem Relation Age of Onset   Diabetes Mother    Stroke Mother    Heart disease Father    Stroke Father    Uterine cancer Sister    Diabetes Sister    Ovarian cancer Sister    Colon cancer Sister    Diabetes Sister    Liver cancer Sister        \   Diabetes Brother    Dementia Brother    Atrial fibrillation Sister    Diabetes Sister    Diabetes Son    Healthy Son    Heart attack Neg Hx    Hypertension Neg Hx    Esophageal cancer Neg Hx    Rectal cancer Neg Hx    Stomach cancer Neg Hx     Past Surgical History:  Procedure Laterality Date   A-FLUTTER ABLATION N/A 02/08/2019   Procedure: A-FLUTTER ABLATION;  Surgeon: Thompson Grayer, MD;  Location: [facility];  Service: Cardiovascular;  Laterality: N/A;   ABDOMINAL HYSTERECTOMY      APPENDECTOMY  1980   BACK SURGERY     CATARACT EXTRACTION, BILATERAL     CHOLECYSTECTOMY  5/00   COLONOSCOPY     implantable loop recorder placement  04/06/2019   MDT Reveal LINQ [id number] ([id number]) implanted for evaluation of palpitations and afib post atrial flutter ablation by Dr Rayann Heman in office   INTRAMEDULLARY (IM) NAIL INTERTROCHANTERIC Left 09/23/2019   Procedure: INTRAMEDULLARY (IM) NAIL INTERTROCHANTRIC;  Surgeon: Shona Needles, MD;  Location: [facility];  Service: Orthopedics;  Laterality: Left;   TONSILLECTOMY     TOTAL ABDOMINAL HYSTERECTOMY W/ BILATERAL SALPINGOOPHORECTOMY  1980   UPPER GASTROINTESTINAL ENDOSCOPY     Social History   Occupational History   Occupation: Retired    Comment: Teacher, adult education , golf, farm   Tobacco Use   Smoking status: Never   Smokeless tobacco: Never  Vaping Use   Vaping Use: Never used  Substance and Sexual Activity   Alcohol use: No   Drug use: No   Sexual activity: Not Currently

## 2022-04-04 ENCOUNTER — Ambulatory Visit (INDEPENDENT_AMBULATORY_CARE_PROVIDER_SITE_OTHER): Payer: Medicare Other | Admitting: Nurse Practitioner

## 2022-04-04 ENCOUNTER — Encounter: Payer: Self-pay | Admitting: Nurse Practitioner

## 2022-04-04 VITALS — BP 142/88 | HR 76 | Temp 97.8°F | Resp 20 | Ht 66.0 in | Wt 143.0 lb

## 2022-04-04 DIAGNOSIS — I5032 Chronic diastolic (congestive) heart failure: Secondary | ICD-10-CM | POA: Diagnosis not present

## 2022-04-04 DIAGNOSIS — K219 Gastro-esophageal reflux disease without esophagitis: Secondary | ICD-10-CM

## 2022-04-04 DIAGNOSIS — E039 Hypothyroidism, unspecified: Secondary | ICD-10-CM | POA: Diagnosis not present

## 2022-04-04 DIAGNOSIS — E782 Mixed hyperlipidemia: Secondary | ICD-10-CM

## 2022-04-04 DIAGNOSIS — R609 Edema, unspecified: Secondary | ICD-10-CM

## 2022-04-04 DIAGNOSIS — M545 Low back pain, unspecified: Secondary | ICD-10-CM

## 2022-04-04 DIAGNOSIS — I10 Essential (primary) hypertension: Secondary | ICD-10-CM | POA: Diagnosis not present

## 2022-04-04 DIAGNOSIS — E876 Hypokalemia: Secondary | ICD-10-CM

## 2022-04-04 DIAGNOSIS — E119 Type 2 diabetes mellitus without complications: Secondary | ICD-10-CM | POA: Diagnosis not present

## 2022-04-04 DIAGNOSIS — F5101 Primary insomnia: Secondary | ICD-10-CM

## 2022-04-04 DIAGNOSIS — Z6826 Body mass index (BMI) 26.0-26.9, adult: Secondary | ICD-10-CM

## 2022-04-04 DIAGNOSIS — E785 Hyperlipidemia, unspecified: Secondary | ICD-10-CM

## 2022-04-04 DIAGNOSIS — M8580 Other specified disorders of bone density and structure, unspecified site: Secondary | ICD-10-CM

## 2022-04-04 DIAGNOSIS — N1832 Chronic kidney disease, stage 3b: Secondary | ICD-10-CM | POA: Diagnosis not present

## 2022-04-04 DIAGNOSIS — I4819 Other persistent atrial fibrillation: Secondary | ICD-10-CM | POA: Diagnosis not present

## 2022-04-04 DIAGNOSIS — G8929 Other chronic pain: Secondary | ICD-10-CM

## 2022-04-04 LAB — BAYER DCA HB A1C WAIVED: HB A1C (BAYER DCA - WAIVED): 7.3 % — ABNORMAL HIGH (ref 4.8–5.6)

## 2022-04-04 MED ORDER — LEVOTHYROXINE SODIUM 75 MCG PO TABS: 75.0000 ug | ORAL_TABLET | Freq: Every day | ORAL | 1 refills | Status: DC

## 2022-04-04 MED ORDER — BENAZEPRIL HCL 40 MG PO TABS: 40.0000 mg | ORAL_TABLET | Freq: Every day | ORAL | 1 refills | Status: DC

## 2022-04-04 MED ORDER — PANTOPRAZOLE SODIUM 40 MG PO TBEC: 40.0000 mg | DELAYED_RELEASE_TABLET | Freq: Every day | ORAL | 1 refills | Status: DC

## 2022-04-04 MED ORDER — FAMOTIDINE 20 MG PO TABS: 20.0000 mg | ORAL_TABLET | Freq: Every day | ORAL | 1 refills | Status: DC

## 2022-04-04 MED ORDER — FUROSEMIDE 40 MG PO TABS: 40.0000 mg | ORAL_TABLET | Freq: Every day | ORAL | 1 refills | Status: DC

## 2022-04-04 MED ORDER — RALOXIFENE HCL 60 MG PO TABS: 60.0000 mg | ORAL_TABLET | Freq: Every day | ORAL | 1 refills | Status: DC

## 2022-04-04 MED ORDER — POTASSIUM CHLORIDE CRYS ER 20 MEQ PO TBCR: 20.0000 meq | EXTENDED_RELEASE_TABLET | Freq: Every day | ORAL | 1 refills | Status: DC

## 2022-04-04 MED ORDER — CLONIDINE HCL 0.1 MG PO TABS: 0.1000 mg | ORAL_TABLET | Freq: Two times a day (BID) | ORAL | 1 refills | Status: DC

## 2022-04-04 MED ORDER — RIVAROXABAN 15 MG PO TABS: 15.0000 mg | ORAL_TABLET | Freq: Every day | ORAL | 1 refills | Status: DC

## 2022-04-04 MED ORDER — LOVASTATIN 40 MG PO TABS: 40.0000 mg | ORAL_TABLET | Freq: Every day | ORAL | 1 refills | Status: DC

## 2022-04-04 NOTE — Patient Instructions (Signed)

## 2022-04-04 NOTE — Progress Notes (Signed)
Subjective:    Patient ID: Katie [name], female    DOB: 1932/04/17, 88 y.o.   MRN: [id number]   Chief Complaint: medical management of chronic issues     HPI:  Katie [name] is a 88 y.o. who identifies as a female who was assigned female at birth.   Social history: Lives with: by herself Work history: retired   Scientist, forensic in today for follow up of the following chronic medical issues:  1. Primary hypertension No c/o chest pain, sob or headache. Does check blood pressure at home. Systolic ranges from 076-226 at times.  BP Readings from Last 3 Encounters:  04/01/22 (!) 181/70  02/21/22 129/67  02/07/22 (!) 153/67     2. Chronic diastolic CHF (congestive heart failure) (HCC) 3. Persistent atrial fibrillation [facility]) Patient had in person visit with cardilogy on 12/30/21. She was doing well and no changes were made to plan of cre. She has pacemaker checked every month.  4. Hyperlipidemia, unspecified hyperlipidemia type Does not really watch diet and does no dedicated exercise due  to her back pain. Lab Results  Component Value Date   CHOL 152 10/03/2021   HDL 67 10/03/2021   LDLCALC 65 10/03/2021   TRIG 114 10/03/2021   CHOLHDL 2.3 10/03/2021     5. Hypokalemia Denies muscle cramps. Is on daily potassium supplement Lab Results  Component Value Date   K 4.8 10/03/2021     6. Hypomagnesemia Has not had magnesium checked in a while  7. Diabetes mellitus type 2, diet-controlled ([place]) Fatsing blood sugars are running around. Lab Results  Component Value Date   HGBA1C 6.5 (H) 10/03/2021     8. Acquired hypothyroidism No problems that she is aware of. Lab Results  Component Value Date   TSH 1.950 10/03/2021     9. Gastroesophageal reflux disease without esophagitis Takes protonix daily  10. Stage 3b chronic kidney disease ([place]) Lab Results  Component Value Date   CREATININE 1.20 (H) 10/03/2021     11. Chronic midline low back pain without  sciatica Has chronic back pain. Has had nback injections which has really helped. She is no longer on pain meds.    12. Primary insomnia On no sleep aids. Sleeps well most nights.  13. Osteopenia, senile Is no longer doing dexascan.  14. Peripheral edema Has some by end of day some days  15. BMI 26.0-26.9,adult No recent weight changes Wt Readings from Last 3 Encounters:  04/04/22 143 lb (64.9 kg)  04/01/22 140 lb (63.5 kg)  02/21/22 142 lb 3.2 oz (64.5 kg)   BMI Readings from Last 3 Encounters:  04/04/22 23.08 kg/m  04/01/22 22.60 kg/m  02/21/22 22.95 kg/m      New complaints: None today  Allergies  Allergen Reactions   Shellfish Allergy Other (See Comments)   Amlodipine Swelling   Macrodantin Nausea And Vomiting   Metformin And Related Nausea And Vomiting and Other (See Comments)    Bloating   Penicillins Rash    Did it involve swelling of the face/tongue/throat, SOB, or low BP? Unknown Did it involve sudden or severe rash/hives, skin peeling, or any reaction on the inside of your mouth or nose? Yes Did you need to seek medical attention at a hospital or doctor's office? Yes When did it last happen? 2005  If all above answers are "NO", may proceed with cephalosporin use.    Outpatient Encounter Medications as of 04/04/2022  Medication Sig   Alum & Mag Hydroxide-Simeth (  GI COCKTAIL) SUSP suspension Take 30 mLs by mouth at bedtime. Shake well.   benazepril (LOTENSIN) 40 MG tablet Take 1 tablet (40 mg total) by mouth at bedtime.   Cholecalciferol (VITAMIN D-3) 125 MCG (5000 UT) TABS Take 5,000 Units by mouth daily.   cloNIDine (CATAPRES) 0.1 MG tablet Take 1 tablet (0.1 mg total) by mouth 2 (two) times daily.   famotidine (PEPCID) 20 MG tablet Take 1 tablet (20 mg total) by mouth at bedtime.   feeding supplement, ENSURE ENLIVE, (ENSURE ENLIVE) LIQD Take 237 mLs by mouth 2 (two) times daily between meals.   fluticasone (FLONASE) 50 MCG/ACT nasal spray USE 2  SPRAYS IN EACH NOSTRIL ONCE DAILY.   furosemide (LASIX) 40 MG tablet Take 1 tablet (40 mg total) by mouth daily.   HYDROcodone-acetaminophen (NORCO) 7.5-325 MG tablet Take 1 tablet by mouth every 6 (six) hours as needed for moderate pain.   levothyroxine (SYNTHROID) 75 MCG tablet Take 1 tablet (75 mcg total) by mouth daily.   lovastatin (MEVACOR) 40 MG tablet Take 1 tablet (40 mg total) by mouth at bedtime.   Multiple Vitamin (MULTIVITAMIN WITH MINERALS) TABS tablet Take 1 tablet by mouth daily.   pantoprazole (PROTONIX) 40 MG tablet Take 1 tablet (40 mg total) by mouth daily.   phenazopyridine (PYRIDIUM) 95 MG tablet Take 1 tablet (95 mg total) by mouth 3 (three) times daily as needed for pain.   potassium chloride SA (KLOR-CON M) 20 MEQ tablet Take 1 tablet (20 mEq total) by mouth daily.   raloxifene (EVISTA) 60 MG tablet Take 1 tablet (60 mg total) by mouth daily.   Rivaroxaban (XARELTO) 15 MG TABS tablet Take 1 tablet (15 mg total) by mouth daily with supper.   No facility-administered encounter medications on file as of 04/04/2022.    Past Surgical History:  Procedure Laterality Date   A-FLUTTER ABLATION N/A 02/08/2019   Procedure: A-FLUTTER ABLATION;  Surgeon: Thompson Grayer, MD;  Location: [facility];  Service: Cardiovascular;  Laterality: N/A;   ABDOMINAL HYSTERECTOMY     APPENDECTOMY  1980   BACK SURGERY     CATARACT EXTRACTION, BILATERAL     CHOLECYSTECTOMY  5/00   COLONOSCOPY     implantable loop recorder placement  04/06/2019   MDT Reveal LINQ [id number] ([id number]) implanted for evaluation of palpitations and afib post atrial flutter ablation by Dr Rayann Heman in office   INTRAMEDULLARY (IM) NAIL INTERTROCHANTERIC Left 09/23/2019   Procedure: INTRAMEDULLARY (IM) NAIL INTERTROCHANTRIC;  Surgeon: Shona Needles, MD;  Location: [facility];  Service: Orthopedics;  Laterality: Left;   TONSILLECTOMY     TOTAL ABDOMINAL HYSTERECTOMY W/ BILATERAL SALPINGOOPHORECTOMY  1980   UPPER  GASTROINTESTINAL ENDOSCOPY      Family History  Problem Relation Age of Onset   Diabetes Mother    Stroke Mother    Heart disease Father    Stroke Father    Uterine cancer Sister    Diabetes Sister    Ovarian cancer Sister    Colon cancer Sister    Diabetes Sister    Liver cancer Sister        _0   Diabetes Brother    Dementia Brother    Atrial fibrillation Sister    Diabetes Sister    Diabetes Son    Healthy Son    Heart attack Neg Hx    Hypertension Neg Hx    Esophageal cancer Neg Hx    Rectal cancer Neg Hx    Stomach cancer  Neg Hx       Controlled substance contract: n/a     Review of Systems  Constitutional:  Negative for diaphoresis.  Eyes:  Negative for pain.  Respiratory:  Negative for shortness of breath.   Cardiovascular:  Negative for chest pain, palpitations and leg swelling.  Gastrointestinal:  Negative for abdominal pain.  Endocrine: Negative for polydipsia.  Skin:  Negative for rash.  Neurological:  Negative for dizziness, weakness and headaches.  Hematological:  Does not bruise/bleed easily.  All other systems reviewed and are negative.      Objective:   Physical Exam Vitals and nursing note reviewed.  Constitutional:      General: She is not in acute distress.    Appearance: Normal appearance. She is well-developed.  HENT:     Head: Normocephalic.     Right Ear: Tympanic membrane normal.     Left Ear: Tympanic membrane normal.     Nose: Nose normal.     Mouth/Throat:     Mouth: Mucous membranes are moist.  Eyes:     Pupils: Pupils are equal, round, and reactive to light.  Neck:     Vascular: No carotid bruit or JVD.  Cardiovascular:     Rate and Rhythm: Normal rate and regular rhythm.     Heart sounds: Normal heart sounds.  Pulmonary:     Effort: Pulmonary effort is normal. No respiratory distress.     Breath sounds: Normal breath sounds. No wheezing or rales.  Chest:     Chest wall: No tenderness.  Abdominal:     General:  Bowel sounds are normal. There is no distension or abdominal bruit.     Palpations: Abdomen is soft. There is no hepatomegaly, splenomegaly, mass or pulsatile mass.     Tenderness: There is no abdominal tenderness.  Musculoskeletal:        General: Normal range of motion.     Cervical back: Normal range of motion and neck supple.  Lymphadenopathy:     Cervical: No cervical adenopathy.  Skin:    General: Skin is warm and dry.  Neurological:     Mental Status: She is alert and oriented to person, place, and time.     Deep Tendon Reflexes: Reflexes are normal and symmetric.  Psychiatric:        Behavior: Behavior normal.        Thought Content: Thought content normal.        Judgment: Judgment normal.    BP (!) 142/88   Pulse 76   Temp 97.8 F (36.6 C) (Temporal)   Resp 20   Ht 5' 6" (1.676 m)   Wt 143 lb (64.9 kg)   SpO2 98%   BMI 23.08 kg/m    HGBA1c 7.3      Assessment & Plan:  Katie [name] comes in today with chief complaint of Medical Management of Chronic Issues   Diagnosis and orders addressed:  1. Primary hypertension Low sodium diet - CBC with Differential/Platelet - CMP14+EGFR - benazepril (LOTENSIN) 40 MG tablet; Take 1 tablet (40 mg total) by mouth at bedtime.  Dispense: 90 tablet; Refill: 1 - cloNIDine (CATAPRES) 0.1 MG tablet; Take 1 tablet (0.1 mg total) by mouth 2 (two) times daily.  Dispense: 180 tablet; Refill: 1  2. Chronic diastolic CHF (congestive heart failure) (HCC) 3. Persistent atrial fibrillation ([place]) Keep follow up with cardiology - Rivaroxaban (XARELTO) 15 MG TABS tablet; Take 1 tablet (15 mg total) by mouth daily with supper.  Dispense: 90 tablet; Refill: 1  4. Hyperlipidemia, unspecified hyperlipidemia type Low fat diet - Lipid panel  5. Hypokalemia Labs pending - potassium chloride SA (KLOR-CON M) 20 MEQ tablet; Take 1 tablet (20 mEq total) by mouth daily.  Dispense: 90 tablet; Refill: 1  6. Hypomagnesemia Labs pending -  Magnesium  7. Diabetes mellitus type 2, diet-controlled ([place]) Continue to watch carbs in diet - Bayer DCA Hb A1c Waived  8. Acquired hypothyroidism Labs oending - levothyroxine (SYNTHROID) 75 MCG tablet; Take 1 tablet (75 mcg total) by mouth daily.  Dispense: 90 tablet; Refill: 1  9. Gastroesophageal reflux disease without esophagitis Avoid spicy foods Do not eat 2 hours prior to bedtime - pantoprazole (PROTONIX) 40 MG tablet; Take 1 tablet (40 mg total) by mouth daily.  Dispense: 90 tablet; Refill: 1 - famotidine (PEPCID) 20 MG tablet; Take 1 tablet (20 mg total) by mouth at bedtime.  Dispense: 90 tablet; Refill: 1  10. Stage 3b chronic kidney disease ([place]) Labs pending  11. Chronic midline low back pain without sciatica Keep follow  up with specialist  12. Primary insomnia Bedtime routine  13. Osteopenia, senile Weight bearing exercises - raloxifene (EVISTA) 60 MG tablet; Take 1 tablet (60 mg total) by mouth daily.  Dispense: 90 tablet; Refill: 1  14. Peripheral edema Elevate legs when swelling - furosemide (LASIX) 40 MG tablet; Take 1 tablet (40 mg total) by mouth daily.  Dispense: 90 tablet; Refill: 1  15. BMI 26.0-26.9,adult Discussed diet and exercise for person with BMI >25 Will recheck weight in 3-6 months  16. Mixed hyperlipidemia [place] fat diet - lovastatin (MEVACOR) 40 MG tablet; Take 1 tablet (40 mg total) by mouth at bedtime.  Dispense: 90 tablet; Refill: 1   Labs pending Health Maintenance reviewed Diet and exercise encouraged  Follow up plan: 6 months   Mary-Margaret Hassell Done, FNP

## 2022-04-05 LAB — CMP14+EGFR
ALT: 10 IU/L (ref 0–32)
AST: 15 IU/L (ref 0–40)
Albumin/Globulin Ratio: 1.7 (ref 1.2–2.2)
Albumin: 4 g/dL (ref 3.7–4.7)
Alkaline Phosphatase: 79 IU/L (ref 44–121)
BUN/Creatinine Ratio: 16 (ref 12–28)
BUN: 18 mg/dL (ref 8–27)
Bilirubin Total: 0.2 mg/dL (ref 0.0–1.2)
CO2: 25 mmol/L (ref 20–29)
Calcium: 9.3 mg/dL (ref 8.7–10.3)
Chloride: 97 mmol/L (ref 96–106)
Creatinine, Ser: 1.11 mg/dL — ABNORMAL HIGH (ref 0.57–1.00)
Globulin, Total: 2.4 g/dL (ref 1.5–4.5)
Glucose: 138 mg/dL — ABNORMAL HIGH (ref 70–99)
Potassium: 4.6 mmol/L (ref 3.5–5.2)
Sodium: 135 mmol/L (ref 134–144)
Total Protein: 6.4 g/dL (ref 6.0–8.5)
eGFR: 48 mL/min/{1.73_m2} — ABNORMAL LOW (ref >59)

## 2022-04-05 LAB — CBC WITH DIFFERENTIAL/PLATELET
Basophils Absolute: 0 10*3/uL (ref 0.0–0.2)
Basos: 0 %
EOS (ABSOLUTE): 0.1 10*3/uL (ref 0.0–0.4)
Eos: 2 %
Hematocrit: 34.2 % (ref 34.0–46.6)
Hemoglobin: 11.5 g/dL (ref 11.1–15.9)
Immature Grans (Abs): 0 10*3/uL (ref 0.0–0.1)
Immature Granulocytes: 0 %
Lymphocytes Absolute: 2.1 10*3/uL (ref 0.7–3.1)
Lymphs: 30 %
MCH: 32.3 pg (ref 26.6–33.0)
MCHC: 33.6 g/dL (ref 31.5–35.7)
MCV: 96 fL (ref 79–97)
Monocytes Absolute: 0.5 10*3/uL (ref 0.1–0.9)
Monocytes: 8 %
Neutrophils Absolute: 4.2 10*3/uL (ref 1.4–7.0)
Neutrophils: 60 %
Platelets: 282 10*3/uL (ref 150–450)
RBC: 3.56 x10E6/uL — ABNORMAL LOW (ref 3.77–5.28)
RDW: 12.3 % (ref 11.7–15.4)
WBC: 6.9 10*3/uL (ref 3.4–10.8)

## 2022-04-05 LAB — LIPID PANEL
Chol/HDL Ratio: 2.2 ratio (ref 0.0–4.4)
Cholesterol, Total: 157 mg/dL (ref 100–199)
HDL: 70 mg/dL (ref >39)
LDL Chol Calc (NIH): 66 mg/dL (ref 0–99)
Triglycerides: 119 mg/dL (ref 0–149)
VLDL Cholesterol Cal: 21 mg/dL (ref 5–40)

## 2022-04-08 LAB — MAGNESIUM: Magnesium: 2.1 mg/dL (ref 1.6–2.3)

## 2022-04-08 LAB — SPECIMEN STATUS REPORT

## 2022-04-11 ENCOUNTER — Other Ambulatory Visit: Payer: Self-pay | Admitting: Nurse Practitioner

## 2022-04-11 ENCOUNTER — Telehealth: Payer: Self-pay | Admitting: Nurse Practitioner

## 2022-04-11 DIAGNOSIS — G8929 Other chronic pain: Secondary | ICD-10-CM

## 2022-04-11 MED ORDER — TRAMADOL HCL 50 MG PO TABS: 50.0000 mg | ORAL_TABLET | Freq: Three times a day (TID) | ORAL | 0 refills | Status: AC | PRN

## 2022-04-11 NOTE — Telephone Encounter (Signed)
  Prescription Request  04/11/2022  Is this a "Controlled Substance" medicine? Yes   Have you seen your PCP in the last 2 weeks? Yes, 8/18  If YES, route message to pool  -  If NO, patient needs to be scheduled for appointment.  What is the name of the medication or equipment? Tramadol   Have you contacted your pharmacy to request a refill? Yes    Which pharmacy would you like this sent to? [facility], [place]   Patient notified that their request is being sent to the clinical staff for review and that they should receive a response within 2 business days.

## 2022-04-11 NOTE — Progress Notes (Signed)
Carelink Summary Report / Loop Recorder 

## 2022-04-11 NOTE — Telephone Encounter (Signed)
  Prescription Request   04/11/2022   Is this a "Controlled Substance" medicine? Yes    Have you seen your PCP in the last 2 weeks? Yes, 8/18   If YES, route message to pool  -  If NO, patient needs to be scheduled for appointment.   What is the name of the medication or equipment? Tramadol    Have you contacted your pharmacy to request a refill? Yes     Which pharmacy would you like this sent to? [place], [place]     Patient notified that their request is being sent to the clinical staff for review and that they should receive a response within 2 business days.

## 2022-04-14 ENCOUNTER — Ambulatory Visit (INDEPENDENT_AMBULATORY_CARE_PROVIDER_SITE_OTHER): Payer: Medicare Other

## 2022-04-14 DIAGNOSIS — I495 Sick sinus syndrome: Secondary | ICD-10-CM

## 2022-04-14 LAB — CUP PACEART REMOTE DEVICE CHECK
Date Time Interrogation Session: 20230817232622
Implantable Pulse Generator Implant Date: 20200819

## 2022-05-01 ENCOUNTER — Telehealth: Payer: Self-pay | Admitting: Nurse Practitioner

## 2022-05-01 ENCOUNTER — Other Ambulatory Visit: Payer: Self-pay | Admitting: Nurse Practitioner

## 2022-05-01 DIAGNOSIS — I10 Essential (primary) hypertension: Secondary | ICD-10-CM

## 2022-05-01 NOTE — Telephone Encounter (Signed)
  Prescription Request  05/01/2022  Is this a "Controlled Substance" medicine? no  Have you seen your PCP in the last 2 weeks? no  If YES, route message to pool  -  If NO, patient needs to be scheduled for appointment.  What is the name of the medication or equipment? benazepril (LOTENSIN) 40 MG tablet  Have you contacted your pharmacy to request a refill? Yes    Which pharmacy would you like this sent to? [facility] called and said they did not receive    Patient notified that their request is being sent to the clinical staff for review and that they should receive a response within 2 business days.

## 2022-05-02 ENCOUNTER — Telehealth: Payer: Self-pay | Admitting: Nurse Practitioner

## 2022-05-02 DIAGNOSIS — I10 Essential (primary) hypertension: Secondary | ICD-10-CM

## 2022-05-02 MED ORDER — BENAZEPRIL HCL 40 MG PO TABS: 40.0000 mg | ORAL_TABLET | Freq: Every day | ORAL | 1 refills | Status: DC

## 2022-05-02 NOTE — Telephone Encounter (Signed)
Rx sent to pharmacy and pt is aware. 

## 2022-05-02 NOTE — Telephone Encounter (Signed)
I spoke to pt and advised pt Katie [name] should have this rx on file as it was sent over 04/04/22 #90 with 1 refill and to contact them to have it filled. Pt voiced understanding.

## 2022-05-08 NOTE — Progress Notes (Signed)
Opening error 

## 2022-05-08 NOTE — Progress Notes (Signed)
Carelink Summary Report / Loop Recorder 

## 2022-05-19 ENCOUNTER — Ambulatory Visit (INDEPENDENT_AMBULATORY_CARE_PROVIDER_SITE_OTHER): Payer: Medicare Other

## 2022-05-19 DIAGNOSIS — I495 Sick sinus syndrome: Secondary | ICD-10-CM

## 2022-05-20 LAB — CUP PACEART REMOTE DEVICE CHECK
Date Time Interrogation Session: 20231001231209
Implantable Pulse Generator Implant Date: 20200819

## 2022-06-03 NOTE — Progress Notes (Signed)
Carelink Summary Report / Loop Recorder 

## 2022-06-10 ENCOUNTER — Encounter: Payer: Self-pay | Admitting: Nurse Practitioner

## 2022-06-10 ENCOUNTER — Ambulatory Visit (INDEPENDENT_AMBULATORY_CARE_PROVIDER_SITE_OTHER): Payer: Medicare Other | Admitting: Nurse Practitioner

## 2022-06-10 VITALS — BP 160/62 | HR 69 | Temp 97.9°F | Resp 20 | Ht 66.0 in | Wt 147.0 lb

## 2022-06-10 DIAGNOSIS — H6123 Impacted cerumen, bilateral: Secondary | ICD-10-CM

## 2022-06-10 NOTE — Progress Notes (Signed)
   Subjective:    Patient ID: Katie [name], female    DOB: 1932-01-16, 88 y.o.   MRN: [id number]   Chief Complaint: Check ears (Had hearing test with [facility] and they wanted ears cleaned out)   HPI Patient says her ears feel stopped up and it is affecting her hearing. She would like them both cleaned out.    Review of Systems  Constitutional:  Negative for diaphoresis.  Eyes:  Negative for pain.  Respiratory:  Negative for shortness of breath.   Cardiovascular:  Negative for chest pain, palpitations and leg swelling.  Gastrointestinal:  Negative for abdominal pain.  Endocrine: Negative for polydipsia.  Skin:  Negative for rash.  Neurological:  Negative for dizziness, weakness and headaches.  Hematological:  Does not bruise/bleed easily.  All other systems reviewed and are negative.      Objective:   Physical Exam Vitals reviewed.  Constitutional:      Appearance: Normal appearance.  HENT:     Right Ear: There is impacted cerumen.     Left Ear: There is impacted cerumen.  Cardiovascular:     Rate and Rhythm: Normal rate and regular rhythm.     Heart sounds: Normal heart sounds.  Pulmonary:     Effort: Pulmonary effort is normal.     Breath sounds: Normal breath sounds.  Skin:    General: Skin is warm.  Neurological:     General: No focal deficit present.     Mental Status: She is alert and oriented to person, place, and time.  Psychiatric:        Mood and Affect: Mood normal.        Behavior: Behavior normal.    BP (!) 160/62   Pulse 69   Temp 97.9 F (36.6 C) (Temporal)   Resp 20   Ht '5\' 6"'$  (1.676 m)   Wt 147 lb (66.7 kg)   SpO2 94%   BMI 23.73 kg/m   S/P irrigation- Tm's clear       Assessment & Plan:  Katie [name] in today with chief complaint of Check ears (Had hearing test with [facility] and they wanted ears cleaned out)   1. Bilateral impacted cerumen Debrox 2-3 x a week Do not stick anything in ears' follow up prn    The above  assessment and management plan was discussed with the patient. The patient verbalized understanding of and has agreed to the management plan. Patient is aware to call the clinic if symptoms persist or worsen. Patient is aware when to return to the clinic for a follow-up visit. Patient educated on when it is appropriate to go to the emergency department.   Mary-Margaret Hassell Done, FNP

## 2022-06-10 NOTE — Patient Instructions (Signed)

## 2022-06-12 ENCOUNTER — Other Ambulatory Visit: Payer: Self-pay | Admitting: Nurse Practitioner

## 2022-06-12 DIAGNOSIS — Z1231 Encounter for screening mammogram for malignant neoplasm of breast: Secondary | ICD-10-CM

## 2022-06-16 ENCOUNTER — Ambulatory Visit
Admission: RE | Admit: 2022-06-16 | Discharge: 2022-06-16 | Disposition: A | Discharge status: Home / Self Care | Payer: Medicare Other | Source: Ambulatory Visit | Attending: Nurse Practitioner | Admitting: Nurse Practitioner

## 2022-06-16 DIAGNOSIS — Z1231 Encounter for screening mammogram for malignant neoplasm of breast: Secondary | ICD-10-CM | POA: Diagnosis not present

## 2022-06-23 ENCOUNTER — Ambulatory Visit (INDEPENDENT_AMBULATORY_CARE_PROVIDER_SITE_OTHER): Payer: Medicare Other

## 2022-06-23 DIAGNOSIS — I495 Sick sinus syndrome: Secondary | ICD-10-CM | POA: Diagnosis not present

## 2022-06-24 ENCOUNTER — Other Ambulatory Visit: Payer: Self-pay | Admitting: Nurse Practitioner

## 2022-06-24 LAB — CUP PACEART REMOTE DEVICE CHECK
Date Time Interrogation Session: 20231103231114
Implantable Pulse Generator Implant Date: 20200819

## 2022-06-27 ENCOUNTER — Ambulatory Visit (INDEPENDENT_AMBULATORY_CARE_PROVIDER_SITE_OTHER): Payer: Medicare Other | Admitting: Nurse Practitioner

## 2022-06-27 ENCOUNTER — Encounter: Payer: Self-pay | Admitting: Nurse Practitioner

## 2022-06-27 VITALS — BP 156/78 | HR 81 | Temp 97.7°F | Resp 20 | Ht 66.0 in | Wt 149.0 lb

## 2022-06-27 DIAGNOSIS — K219 Gastro-esophageal reflux disease without esophagitis: Secondary | ICD-10-CM | POA: Diagnosis not present

## 2022-06-27 DIAGNOSIS — G8929 Other chronic pain: Secondary | ICD-10-CM

## 2022-06-27 DIAGNOSIS — M545 Low back pain, unspecified: Secondary | ICD-10-CM

## 2022-06-27 MED ORDER — HYDROCODONE-ACETAMINOPHEN 5-325 MG PO TABS: 1.0000 | ORAL_TABLET | Freq: Four times a day (QID) | ORAL | 0 refills | Status: DC | PRN

## 2022-06-27 MED ORDER — TRAMADOL HCL 50 MG PO TABS: 50.0000 mg | ORAL_TABLET | Freq: Three times a day (TID) | ORAL | 2 refills | Status: AC | PRN

## 2022-06-27 MED ORDER — GI COCKTAIL ~~LOC~~: 30.0000 mL | Freq: Every day | ORAL | 2 refills | Status: DC

## 2022-06-27 NOTE — Progress Notes (Signed)
Subjective:    Patient ID: Katie [name], female    DOB: October 16, 1931, 88 y.o.   MRN: [id number]   Chief Complaint: chronic back pain  Pain assessment: Cause of pain- DDD Pain location- lower back Pain on scale of 1-10- 8/10 currently Frequency- daily What increases pain-to much activity What makes pain Better-rest helps Effects on ADL - none Any change in general medical condition-none  Current opioids rx- ultram 50 # meds rx- 60 Effectiveness of current meds-helps Adverse reactions from pain meds-none Morphine equivalent- 20MME  Pill count performed-Yes Last drug screen - 6/1/423 ( high risk q13m moderate risk q640mlow risk yearly ) Urine drug screen today- No [place]  If yes were their any concerning findings? - no   Overdose risk: 1    Pain contract signed on: 01/31/22      Review of Systems  Constitutional:  Negative for diaphoresis.  Eyes:  Negative for pain.  Respiratory:  Negative for shortness of breath.   Cardiovascular:  Negative for chest pain, palpitations and leg swelling.  Gastrointestinal:  Negative for abdominal pain.  Endocrine: Negative for polydipsia.  Skin:  Negative for rash.  Neurological:  Negative for dizziness, weakness and headaches.  Hematological:  Does not bruise/bleed easily.  All other systems reviewed and are negative.      Objective:   Physical Exam Vitals and nursing note reviewed.  Constitutional:      General: She is not in acute distress.    Appearance: Normal appearance. She is well-developed.  HENT:     Head: Normocephalic.     Right Ear: Tympanic membrane normal.     Left Ear: Tympanic membrane normal.     Nose: Nose normal.     Mouth/Throat:     Mouth: Mucous membranes are moist.  Eyes:     Pupils: Pupils are equal, round, and reactive to light.  Neck:     Vascular: No carotid bruit or JVD.  Cardiovascular:     Rate and Rhythm: Normal rate and regular rhythm.     Heart sounds: Normal  heart sounds.  Pulmonary:     Effort: Pulmonary effort is normal. No respiratory distress.     Breath sounds: Normal breath sounds. No wheezing or rales.  Chest:     Chest wall: No tenderness.  Abdominal:     General: Bowel sounds are normal. There is no distension or abdominal bruit.     Palpations: Abdomen is soft. There is no hepatomegaly, splenomegaly, mass or pulsatile mass.     Tenderness: There is no abdominal tenderness.  Musculoskeletal:        General: Normal range of motion.     Cervical back: Normal range of motion and neck supple.  Lymphadenopathy:     Cervical: No cervical adenopathy.  Skin:    General: Skin is warm and dry.  Neurological:     Mental Status: She is alert and oriented to person, place, and time.     Deep Tendon Reflexes: Reflexes are normal and symmetric.  Psychiatric:        Behavior: Behavior normal.        Thought Content: Thought content normal.        Judgment: Judgment normal.     BP (!) 156/78   Pulse 81   Temp 97.7 F (36.5 C) (Temporal)   Resp 20   Ht '5\' 6"'$  (1.676 m)   Wt 149 lb (67.6 kg)   SpO2 93%  BMI 24.05 kg/m          Assessment & Plan:   Katie [name] in today with chief complaint of Pain Management   1. Chronic midline low back pain without sciatica Meds ordered this encounter  Medications   DISCONTD: HYDROcodone-acetaminophen (NORCO) 5-325 MG tablet    Sig: Take 1 tablet by mouth every 6 (six) hours as needed for moderate pain.    Dispense:  30 tablet    Refill:  0    Order Specific Question:   Supervising Provider    Answer:   Caryl Pina A [[id number]]   DISCONTD: HYDROcodone-acetaminophen (NORCO) 5-325 MG tablet    Sig: Take 1 tablet by mouth every 6 (six) hours as needed for moderate pain.    Dispense:  30 tablet    Refill:  0    Order Specific Question:   Supervising Provider    Answer:   Caryl Pina A [[id number]]   DISCONTD: HYDROcodone-acetaminophen (NORCO) 5-325 MG tablet    Sig: Take 1  tablet by mouth every 6 (six) hours as needed for moderate pain.    Dispense:  30 tablet    Refill:  0    Order Specific Question:   Supervising Provider    Answer:   Caryl Pina A [[id number]]   traMADol (ULTRAM) 50 MG tablet    Sig: Take 1 tablet (50 mg total) by mouth every 8 (eight) hours as needed.    Dispense:  60 tablet    Refill:  2    Cancel norco prescription    Order Specific Question:   Supervising Provider    Answer:   Caryl Pina A [[id number]]   Alum & Mag Hydroxide-Simeth (GI COCKTAIL) SUSP suspension    Sig: Take 30 mLs by mouth at bedtime. Shake well.    Dispense:  900 mL    Refill:  2    Order Specific Question:   Supervising Provider    Answer:   Caryl Pina A [[id number]]     2. Gastroesophageal reflux disease without esophagitis Refilled meds - Alum & Mag Hydroxide-Simeth (GI COCKTAIL) SUSP suspension; Take 30 mLs by mouth at bedtime. Shake well.  Dispense: 900 mL; Refill: 2    The above assessment and management plan was discussed with the patient. The patient verbalized understanding of and has agreed to the management plan. Patient is aware to call the clinic if symptoms persist or worsen. Patient is aware when to return to the clinic for a follow-up visit. Patient educated on when it is appropriate to go to the emergency department.   Mary-Margaret Hassell Done, FNP

## 2022-06-27 NOTE — Patient Instructions (Signed)
Tramadol Tablets What is this medication? TRAMADOL (TRA ma dole) treats severe pain. It is prescribed when other pain medications have not worked or cannot be tolerated. It works by blocking pain signals in the brain. It belongs to a group of medications called opioids. This medicine may be used for other purposes; ask your health care provider or pharmacist if you have questions. COMMON BRAND NAME(S): Ultram What should I tell my care team before I take this medication? They need to know if you have any of these conditions: Brain tumor Drug abuse or addiction Head injury If you often drink alcohol Kidney disease Liver disease Lung disease, asthma, or breathing problems Seizures Stomach or intestine problems Suicidal thoughts, plans or attempt; a previous suicide attempt by you or a family member Taken an MAOI like Marplan, Nardil, or Parnate in the last 14 days An unusual or allergic reaction to tramadol, other medications, foods, dyes, or preservatives Pregnant or trying to get pregnant Breast-feeding How should I use this medication? Take this medication by mouth with a full glass of water. Follow the directions on the prescription label. You can take it with or without food. If it upsets your stomach, take it with food. Do not take your medication more often than directed. A special MedGuide will be given to you by the pharmacist with each prescription and refill. Be sure to read this information carefully each time. Talk to your care team regarding the use of this medication in children. Special care may be needed. Overdosage: If you think you have taken too much of this medicine contact a poison control center or emergency room at once. NOTE: This medicine is only for you. Do not share this medicine with others. What if I miss a dose? If you miss a dose, take it as soon as you can. If it is almost time for your next dose, take only that dose. Do not take double or extra doses. What  may interact with this medication? Do not take this medication with any of the following: Linezolid MAOIs like Marplan, Nardil, and Parnate Methylene blue Ozanimod This medication may also interact with the following: Alcohol Antihistamines for allergy, cough, and cold Atropine Certain antibiotics like erythromycin, clarithromycin, rifampin Certain antivirals for HIV or hepatitis Certain medications for anxiety or sleep Certain medications for bladder problems like oxybutynin, tolterodine Certain medications for depression like amitriptyline, bupropion, fluoxetine, paroxetine, sertraline Certain medications for fungal infections like ketoconazole, itraconazole, or posaconazole Certain medications for migraine headache like almotriptan, eletriptan, frovatriptan, naratriptan, rizatriptan, sumatriptan, zolmitriptan Certain medications for Parkinson's disease like benztropine, trihexyphenidyl Certain medications for seizures like carbamazepine, phenobarbital, primidone Certain medications for stomach problems like dicyclomine, hyoscyamine Certain medications for travel sickness like scopolamine Digoxin Diuretics General anesthetics like halothane, isoflurane, methoxyflurane, propofol Ipratropium Medications that relax muscles for surgery Other narcotic medications for pain Phenothiazines like chlorpromazine, mesoridazine, prochlorperazine, thioridazine Quinidine Warfarin This list may not describe all possible interactions. Give your health care provider a list of all the medicines, herbs, non-prescription drugs, or dietary supplements you use. Also tell them if you smoke, drink alcohol, or use illegal drugs. Some items may interact with your medicine. What should I watch for while using this medication? Tell your care team if your pain does not go away, if it gets worse, or if you have new or a different type of pain. You may develop tolerance to this medication. Tolerance means that you  will need a higher dose of the medication for pain relief.  Tolerance is normal and is expected if you take this medication for a long time. Taking this medication with other substances that cause drowsiness, such as alcohol, benzodiazepines, or other opioids can cause serious side effects. Give your care team a list of all medications you use. They will tell you how much medication to take. Do not take more medication than directed. Call emergency services if you have problems breathing or staying awake. Long term use of this medication may cause your brain and body to depend on it. This can happen even when used as directed by your care team. You and your care team will work together to determine how long you will need to take this medication. If your care team wants you to stop this medication, the dose will be slowly lowered over time to reduce the risk of side effects. Naloxone is an emergency medication used for an opioid overdose. An overdose can happen if you take too much of an opioid. It can also happen if an opioid is taken with some other medications or substances such as alcohol. Know the symptoms of an overdose, such as trouble breathing, unusually tired or sleepy, or not being able to respond or wake up. Make sure to tell caregivers and close contacts where your naloxone is stored. Make sure they know how to use it. After naloxone is given, the person giving it must call emergency services. Naloxone is a temporary treatment. Repeat doses may be needed. This medication may affect your coordination, reaction time, or judgment. Do not drive or operate machinery until you know how this medication affects you. Sit up or stand slowly to reduce the risk of dizzy or fainting spells. Drinking alcohol with this medication can increase the risk of these side effects. This medication may cause serious skin reactions. They can happen weeks to months after starting the medication. Contact your care team right  away if you notice fevers or flu-like symptoms with a rash. The rash may be red or purple and then turn into blisters or peeling of the skin. Or, you might notice a red rash with swelling of the face, lips, or lymph nodes in your neck or under your arms. This medication will cause constipation. If you do not have a bowel movement for 3 days, call your care team. Your mouth may get dry. Chewing sugarless gum or sucking hard candy and drinking plenty of water may help. Contact your care team if the problem does not go away or is severe. Talk to your care team if you are pregnant or think you might be pregnant. This medication can cause serious birth defects if taken during pregnancy. Prolonged use of this medication during pregnancy can cause withdrawal in a newborn. Talk to your care team before breastfeeding. Changes to your treatment plan may be needed. If you breastfeed while taking this medication, seek medical care right away if you notice the child has slow or noisy breathing, is unusually sleepy or not able to wake up, or is limp. Long-term use of this medication may cause infertility. Talk to your care team if you are concerned about your fertility. What side effects may I notice from receiving this medication? Side effects that you should report to your care team as soon as possible: Allergic reactions--skin rash, itching, hives, swelling of the face, lips, tongue, or throat CNS depression--slow or shallow breathing, shortness of breath, feeling faint, dizziness, confusion, difficulty staying awake Low adrenal gland function--nausea, vomiting, loss of appetite, unusual weakness  or fatigue, dizziness Low blood pressure--dizziness, feeling faint or lightheaded, blurry vision Low blood sugar (hypoglycemia)--tremors or shaking, anxiety, sweating, cold or clammy skin, confusion, dizziness, rapid heartbeat Low sodium level--muscle weakness, fatigue, dizziness, headache, confusion Redness, blistering,  peeling, or loosening of the skin, including inside the mouth Seizures Side effects that usually do not require medical attention (report to your care team if they continue or are bothersome): Constipation Dizziness Drowsiness Dry mouth Headache Nausea Vomiting This list may not describe all possible side effects. Call your doctor for medical advice about side effects. You may report side effects to FDA at 1-800-FDA-1088. Where should I keep my medication? Keep out of the reach of children and pets. This medication can be abused. Keep it in a safe place to protect it from theft. Do not share it with anyone. It is only for you. Selling or giving away this medication is dangerous and against the law. Store at room temperature between 20 and 25 degrees C (68 and 77 degrees F). Get rid of any unused medication after the expiration date. This medication may cause harm and death if it is taken by other adults, children, or pets. It is important to get rid of the medication as soon as you no longer need it, or it is expired. You can do this in two ways: Take the medication to a medication take-back program. Check with your pharmacy or law enforcement to find a location. If you cannot return the medication, check the label or package insert to see if the medication should be thrown out in the garbage or flushed down the toilet. If you are not sure, ask your care team. If it is safe to put it in the trash, take the medication out of the container. Mix the medication with cat litter, dirt, coffee grounds, or other unwanted substance. Seal the mixture in a bag or container. Put it in the trash. NOTE: This sheet is a summary. It may not cover all possible information. If you have questions about this medicine, talk to your doctor, pharmacist, or health care provider.  2023 [employer] (2020-08-06 00:00:00)

## 2022-06-30 ENCOUNTER — Other Ambulatory Visit: Payer: Self-pay | Admitting: Nurse Practitioner

## 2022-06-30 DIAGNOSIS — E782 Mixed hyperlipidemia: Secondary | ICD-10-CM

## 2022-07-25 NOTE — Progress Notes (Signed)
Carelink Summary Report / Loop Recorder 

## 2022-07-28 ENCOUNTER — Ambulatory Visit (INDEPENDENT_AMBULATORY_CARE_PROVIDER_SITE_OTHER): Payer: Medicare Other

## 2022-07-28 DIAGNOSIS — I4819 Other persistent atrial fibrillation: Secondary | ICD-10-CM | POA: Diagnosis not present

## 2022-07-28 LAB — CUP PACEART REMOTE DEVICE CHECK
Date Time Interrogation Session: 20231210232130
Implantable Pulse Generator Implant Date: 20200819

## 2022-07-31 DIAGNOSIS — L820 Inflamed seborrheic keratosis: Secondary | ICD-10-CM | POA: Diagnosis not present

## 2022-07-31 DIAGNOSIS — L821 Other seborrheic keratosis: Secondary | ICD-10-CM | POA: Diagnosis not present

## 2022-07-31 DIAGNOSIS — C44719 Basal cell carcinoma of skin of left lower limb, including hip: Secondary | ICD-10-CM | POA: Diagnosis not present

## 2022-07-31 DIAGNOSIS — L538 Other specified erythematous conditions: Secondary | ICD-10-CM | POA: Diagnosis not present

## 2022-08-01 ENCOUNTER — Other Ambulatory Visit: Payer: Medicare Other

## 2022-09-01 ENCOUNTER — Ambulatory Visit (INDEPENDENT_AMBULATORY_CARE_PROVIDER_SITE_OTHER): Payer: Medicare Other

## 2022-09-01 DIAGNOSIS — I4819 Other persistent atrial fibrillation: Secondary | ICD-10-CM

## 2022-09-02 ENCOUNTER — Ambulatory Visit (INDEPENDENT_AMBULATORY_CARE_PROVIDER_SITE_OTHER): Payer: Medicare Other

## 2022-09-02 VITALS — Ht 66.0 in | Wt 142.0 lb

## 2022-09-02 DIAGNOSIS — Z0000 Encounter for general adult medical examination without abnormal findings: Secondary | ICD-10-CM

## 2022-09-02 DIAGNOSIS — Z780 Asymptomatic menopausal state: Secondary | ICD-10-CM

## 2022-09-02 LAB — CUP PACEART REMOTE DEVICE CHECK
Date Time Interrogation Session: 20240112232203
Implantable Pulse Generator Implant Date: 20200819

## 2022-09-02 NOTE — Progress Notes (Signed)
Subjective:   Katie [name] is a 89 y.o. female who presents for Medicare Annual (Subsequent) preventive examination. I connected with  [name] on 09/02/22 by a audio enabled telemedicine application and verified that I am speaking with the correct person using two identifiers.  Patient Location: Home  Provider Location: Home Office  I discussed the limitations of evaluation and management by telemedicine. The patient expressed understanding and agreed to proceed.  Review of Systems     Cardiac Risk Factors include: advanced age (>65mn, >>83women);hypertension     Objective:    Today's Vitals   09/02/22 1544  Weight: 142 lb (64.4 kg)  Height: '5\' 6"'$  (1.676 m)   Body mass index is 22.92 kg/m.     09/02/2022    3:50 PM 01/17/2022   11:44 AM 08/30/2021    3:44 PM 08/27/2020   11:11 AM 09/22/2019   10:23 AM 05/09/2019    2:37 PM 02/08/2019    9:20 AM  Advanced Directives  Does Patient Have a Medical Advance Directive? No No Yes No No No No  Type of AComptrollerLiving will      Copy of [place]?   No - copy requested      Would patient like information on creating a medical advance directive? No - Patient declined No - Guardian declined  No - Patient declined  No - Patient declined No - Patient declined    Current Medications (verified) Outpatient Encounter Medications as of 09/02/2022  Medication Sig   Alum & Mag Hydroxide-Simeth (GI COCKTAIL) SUSP suspension Take 30 mLs by mouth at bedtime. Shake well.   benazepril (LOTENSIN) 40 MG tablet Take 1 tablet (40 mg total) by mouth at bedtime.   Cholecalciferol (VITAMIN D-3) 125 MCG (5000 UT) TABS Take 5,000 Units by mouth daily.   cloNIDine (CATAPRES) 0.1 MG tablet Take 1 tablet (0.1 mg total) by mouth 2 (two) times daily.   famotidine (PEPCID) 20 MG tablet Take 1 tablet (20 mg total) by mouth at bedtime.   feeding supplement, ENSURE ENLIVE, (ENSURE ENLIVE) LIQD  Take 237 mLs by mouth 2 (two) times daily between meals.   fluticasone (FLONASE) 50 MCG/ACT nasal spray USE 2 SPRAYS IN EACH NOSTRIL ONCE DAILY.   furosemide (LASIX) 40 MG tablet Take 1 tablet (40 mg total) by mouth daily.   levothyroxine (SYNTHROID) 75 MCG tablet Take 1 tablet (75 mcg total) by mouth daily.   lovastatin (MEVACOR) 40 MG tablet TAKE ONE TABLET AT BEDTIME   Multiple Vitamin (MULTIVITAMIN WITH MINERALS) TABS tablet Take 1 tablet by mouth daily.   pantoprazole (PROTONIX) 40 MG tablet Take 1 tablet (40 mg total) by mouth daily.   potassium chloride SA (KLOR-CON M) 20 MEQ tablet Take 1 tablet (20 mEq total) by mouth daily.   raloxifene (EVISTA) 60 MG tablet Take 1 tablet (60 mg total) by mouth daily.   Rivaroxaban (XARELTO) 15 MG TABS tablet Take 1 tablet (15 mg total) by mouth daily with supper.   traMADol (ULTRAM) 50 MG tablet Take 50 mg by mouth 3 (three) times daily as needed.   phenazopyridine (PYRIDIUM) 95 MG tablet Take 1 tablet (95 mg total) by mouth 3 (three) times daily as needed for pain. (Patient not taking: Reported on 09/02/2022)   No facility-administered encounter medications on file as of 09/02/2022.    Allergies (verified) Shellfish allergy, Amlodipine, Macrodantin, Metformin and related, and Penicillins   History:  Past Medical History:  Diagnosis Date   Anxiety    Aortic insufficiency    a. Trivial AI by echo 02/2016.   Atrial fibrillation and flutter ([place])    a. Coarse afib vs flutter by EKG 12/2015.   Cancer ([place])    skin cancer   Cataract    Chronic diastolic CHF (congestive heart failure) (HCC)    CKD (chronic kidney disease), stage III ([place])    COVID-19    GERD (gastroesophageal reflux disease)    Hiatal hernia    Hypercholesterolemia    Hypertension    Hypokalemia    Hypothyroidism    Left knee pain    NIDDM (non-insulin dependent diabetes mellitus)    diet controlled    Osteoporosis    Premature atrial contractions    PVC's (premature  ventricular contractions)    Vitamin D deficiency    Past Surgical History:  Procedure Laterality Date   A-FLUTTER ABLATION N/A 02/08/2019   Procedure: A-FLUTTER ABLATION;  Surgeon: Thompson Grayer, MD;  Location: [facility];  Service: Cardiovascular;  Laterality: N/A;   ABDOMINAL HYSTERECTOMY     APPENDECTOMY  1980   BACK SURGERY     CATARACT EXTRACTION, BILATERAL     CHOLECYSTECTOMY  5/00   COLONOSCOPY     implantable loop recorder placement  04/06/2019   MDT Reveal LINQ [id number] ([id number]) implanted for evaluation of palpitations and afib post atrial flutter ablation by Dr Rayann Heman in office   INTRAMEDULLARY (IM) NAIL INTERTROCHANTERIC Left 09/23/2019   Procedure: INTRAMEDULLARY (IM) NAIL INTERTROCHANTRIC;  Surgeon: Shona Needles, MD;  Location: [facility];  Service: Orthopedics;  Laterality: Left;   TONSILLECTOMY     TOTAL ABDOMINAL HYSTERECTOMY W/ BILATERAL SALPINGOOPHORECTOMY  1980   UPPER GASTROINTESTINAL ENDOSCOPY     Family History  Problem Relation Age of Onset   Diabetes Mother    Stroke Mother    Heart disease Father    Stroke Father    Uterine cancer Sister    Diabetes Sister    Ovarian cancer Sister    Colon cancer Sister    Diabetes Sister    Liver cancer Sister        \   Atrial fibrillation Sister    Diabetes Sister    Diabetes Brother    Dementia Brother    Diabetes Son    Healthy Son    Heart attack Neg Hx    Hypertension Neg Hx    Esophageal cancer Neg Hx    Rectal cancer Neg Hx    Stomach cancer Neg Hx    Breast cancer Neg Hx    Social History   Socioeconomic History   Marital status: Widowed    Spouse name: Not on file   Number of children: 2   Years of education: Not on file   Highest education level: High school graduate  Occupational History   Occupation: Retired    Comment: Teacher, adult education , golf, farm   Tobacco Use   Smoking status: Never   Smokeless tobacco: Never  Vaping Use   Vaping Use: Never used  Substance and Sexual  Activity   Alcohol use: No   Drug use: No   Sexual activity: Not Currently  Other Topics Concern   Not on file  Social History Narrative   Patient is widowed she has 2 children she used to work in Char[employer] fundraiser   One son in [place], another in [place]  Resource Strain: Low Risk  (09/02/2022)   Overall Financial Resource Strain (CARDIA)    Difficulty of Paying Living Expenses: Not hard at all  Food Insecurity: No Food Insecurity (09/02/2022)   Hunger Vital Sign    Worried About Running Out of Food in the Last Year: Never true    Ran Out of Food in the Last Year: Never true  Transportation Needs: No Transportation Needs (09/02/2022)   PRAPARE - Hydrologist (Medical): No    Lack of Transportation (Non-Medical): No  Physical Activity: Inactive (09/02/2022)   Exercise Vital Sign    Days of Exercise per Week: 0 days    Minutes of Exercise per Session: 0 min  Stress: No Stress Concern Present (09/02/2022)   [place]    Feeling of Stress : Not at all  Social Connections: Socially Isolated (09/02/2022)   Social Connection and Isolation Panel [NHANES]    Frequency of Communication with Friends and Family: More than three times a week    Frequency of Social Gatherings with Friends and Family: More than three times a week    Attends Religious Services: Never    Marine scientist or Organizations: No    Attends Archivist Meetings: Never    Marital Status: Widowed    Tobacco Counseling Counseling given: Not Answered   Clinical Intake:  Pre-visit preparation completed: Yes  Pain : No/denies pain     Nutritional Risks: None Diabetes: No  How often do you need to have someone help you when you read instructions, pamphlets, or other written materials from your doctor or pharmacy?: 1 - Never  Diabetic?no   Interpreter Needed?:  No  Information entered by :: Jadene Pierini, LPN   Activities of Daily Living    09/02/2022    3:50 PM  In your present state of health, do you have any difficulty performing the following activities:  Hearing? 0  Vision? 0  Difficulty concentrating or making decisions? 0  Walking or climbing stairs? 0  Dressing or bathing? 0  Doing errands, shopping? 0  Preparing Food and eating ? N  Using the Toilet? N  In the past six months, have you accidently leaked urine? N  Do you have problems with loss of bowel control? N  Managing your Medications? N  Managing your Finances? N  Housekeeping or managing your Housekeeping? N    Patient Care Team: [name], FNP as PCP - General (Nurse Practitioner) [name], MD as PCP - Cardiology (Cardiology) Thompson Grayer, MD as PCP - Electrophysiology (Cardiology) [name], MD as Consulting Physician (Cardiology) Renard Hamper Marguerita Merles (Physician Assistant) Jessy Oto, MD (Inactive) as Consulting Physician (Orthopedic Surgery) Melina Schools, OD (Optometry)  Indicate any recent Medical Services you may have received from other than [facility] providers in the past year (date may be approximate).     Assessment:   This is a routine wellness examination for Gianne.  Hearing/Vision screen Vision Screening - Comments:: Wears rx glasses - up to date with routine eye exams with  Dr. Wynetta Emery   Dietary issues and exercise activities discussed: Current Exercise Habits: The patient does not participate in regular exercise at present   Goals Addressed             This Visit's Progress    DIET - EAT MORE FRUITS AND VEGETABLES   On track      Depression Screen  09/02/2022    3:48 PM 06/27/2022    2:03 PM 06/10/2022   12:52 PM 04/04/2022   10:40 AM 02/21/2022   11:26 AM 02/07/2022    1:15 PM 01/15/2022    1:57 PM  PHQ 2/9 Scores  PHQ - 2 Score 0 0 0 0 0 0 0  PHQ- 9 Score  0 0 0 0 0     Fall Risk     09/02/2022    3:45 PM 06/10/2022   12:52 PM 04/04/2022   10:40 AM 02/21/2022   11:49 AM 02/21/2022   11:26 AM  [place] in the past year? 0 0 '1 1 1  '$ Number falls in past yr: 0  0  1  Injury with Fall? 0  0  1  Risk for fall due to : No Fall Risks  History of fall(s)  History of fall(s)  Follow up Falls prevention discussed  Education provided  Education provided    FALL RISK PREVENTION PERTAINING TO THE HOME:  Any stairs in or around the home? No  If so, are there any without handrails? No  Home free of loose throw rugs in walkways, pet beds, electrical cords, etc? Yes  Adequate lighting in your home to reduce risk of falls? Yes   ASSISTIVE DEVICES UTILIZED TO PREVENT FALLS:  Life alert? Yes  Use of a cane, walker or w/c? Yes  Grab bars in the bathroom? Yes  Shower chair or bench in shower? Yes  Elevated toilet seat or a handicapped toilet? Yes       05/05/2018    3:34 PM 04/06/2017   10:13 AM 03/26/2016    9:27 AM  MMSE - Mini Mental State Exam  Orientation to time '5 4 5  '$ Orientation to Place '5 5 5  '$ Registration '3 3 3  '$ Attention/ Calculation '5 5 5  '$ Recall '3 2 2  '$ Language- name 2 objects '2 2 2  '$ Language- repeat 1 1 0  Language- follow 3 step command '3 3 3  '$ Language- read & follow direction '1 1 1  '$ Write a sentence '1 1 1  '$ Copy design 1 1 0  Total score '30 28 27        '$ 09/02/2022    3:50 PM 08/27/2020   11:15 AM 05/09/2019    2:41 PM  6CIT Screen  What Year? 0 points 0 points 0 points  What month? 0 points 0 points 0 points  What time? 0 points 0 points 0 points  Count back from 20 0 points 0 points 0 points  Months in reverse 0 points 0 points 0 points  Repeat phrase 2 points 0 points 0 points  Total Score 2 points 0 points 0 points    Immunizations Immunization History  Administered Date(s) Administered   Fluad Quad(high Dose 65+) 04/22/2019, 06/29/2020   Influenza, High Dose Seasonal PF 04/27/2017, 06/01/2018   Influenza,inj,Quad PF,6+ Mos 05/09/2013,  05/18/2014, 04/17/2016   Influenza-Unspecified 04/28/2017   Moderna Sars-Covid-2 Vaccination 04/17/2020, 05/02/2020   Pneumococcal Conjugate-13 02/07/2015   Pneumococcal Polysaccharide-23 06/30/2012   Tdap 12/01/2011   Zoster Recombinat (Shingrix) 04/28/2017, 07/16/2017    TDAP status: Due, Education has been provided regarding the importance of this vaccine. Advised may receive this vaccine at local pharmacy or Health Dept. Aware to provide a copy of the vaccination record if obtained from local pharmacy or Health Dept. Verbalized acceptance and understanding.  Flu Vaccine status: Up to date  Pneumococcal vaccine status: Up to date  Covid-19 vaccine status: Completed vaccines  Qualifies for Shingles Vaccine? Yes   Zostavax completed Yes   Shingrix Completed?: Yes  Screening Tests Health Maintenance  Topic Date Due   COVID-19 Vaccine (3 - Moderna risk series) 05/30/2020   DTaP/Tdap/Td (2 - Td or Tdap) 11/30/2021   INFLUENZA VACCINE  11/16/2022 (Originally 03/18/2022)   DEXA SCAN  04/05/2023 (Originally 09/23/2019)   COLONOSCOPY (Pts 45-27yr Insurance coverage will need to be confirmed)  04/05/2023 (Originally 05/28/2017)   HEMOGLOBIN A1C  10/05/2022   OPHTHALMOLOGY EXAM  11/27/2022   FOOT EXAM  04/05/2023   MAMMOGRAM  06/17/2023   Medicare Annual Wellness (AWV)  09/03/2023   Pneumonia Vaccine 89 Years old  Completed   Zoster Vaccines- Shingrix  Completed   HPV VACCINES  Aged Out    Health Maintenance  Health Maintenance Due  Topic Date Due   COVID-19 Vaccine (3 - Moderna risk series) 05/30/2020   DTaP/Tdap/Td (2 - Td or Tdap) 11/30/2021    Colorectal cancer screening: No longer required.   Mammogram status: No longer required due to age.  Bone Density status: Ordered 09/02/2022. Pt provided with contact info and advised to call to schedule appt.  Lung Cancer Screening: (Low Dose CT Chest recommended if Age 87-80years, 30 pack-year currently smoking OR have quit  w/in 15years.) does not qualify.   Lung Cancer Screening Referral: n/a  Additional Screening:  Hepatitis C Screening: does not qualify;   Vision Screening: Recommended annual ophthalmology exams for early detection of glaucoma and other disorders of the eye. Is the patient up to date with their annual eye exam?  Yes  Who is the provider or what is the name of the office in which the patient attends annual eye exams? Dr.Johnson  If pt is not established with a provider, would they like to be referred to a provider to establish care? No .   Dental Screening: Recommended annual dental exams for proper oral hygiene  Community Resource Referral / Chronic Care Management: CRR required this visit?  No   CCM required this visit?  No      Plan:     I have personally reviewed and noted the following in the patient's chart:   Medical and social history Use of alcohol, tobacco or illicit drugs  Current medications and supplements including opioid prescriptions. Patient is not currently taking opioid prescriptions. Functional ability and status Nutritional status Physical activity Advanced directives List of other physicians Hospitalizations, surgeries, and ER visits in previous 12 months Vitals Screenings to include cognitive, depression, and falls Referrals and appointments  In addition, I have reviewed and discussed with patient certain preventive protocols, quality metrics, and best practice recommendations. A written personalized care plan for preventive services as well as general preventive health recommendations were provided to patient.     LDaphane Shepherd LPN   [id number]/97/[id number]  Nurse Notes: Due DEXA scan

## 2022-09-02 NOTE — Patient Instructions (Signed)
Katie [name] , Thank you for taking time to come for your Medicare Wellness Visit. I appreciate your ongoing commitment to your health goals. Please review the following plan we discussed and let me know if I can assist you in the future.   These are the goals we discussed:  Goals      AWV     08/27/2020 AWV Goal: Fall Prevention  Over the next year, patient will decrease their risk for falls by: Using assistive devices, such as a cane or walker, as needed Identifying fall risks within their home and correcting them by: Removing throw rugs Adding handrails to stairs or ramps Removing clutter and keeping a clear pathway throughout the home Increasing light, especially at night Adding shower handles/bars Raising toilet seat Identifying potential personal risk factors for falls: Medication side effects Incontinence/urgency Vestibular dysfunction Hearing loss Musculoskeletal disorders Neurological disorders Orthostatic hypotension  08/27/2020 AWV Goal: Diabetes Management  Patient will maintain an A1C level below 8.0 Patient will not develop any diabetic foot complications Patient will not experience any hypoglycemic episodes over the next 3 months Patient will notify our office of any CBG readings outside of the provider recommended range by calling [phone] Patient will adhere to provider recommendations for diabetes management  Patient Self Management Activities take all medications as prescribed and report any negative side effects monitor and record blood sugar readings as directed adhere to a low carbohydrate diet that incorporates lean proteins, vegetables, whole grains, low glycemic fruits check feet daily noting any sores, cracks, injuries, or callous formations see PCP or podiatrist if she notices any changes in her legs, feet, or toenails Patient will visit PCP and have an A1C level checked every 3 to 6 months as directed  have a yearly eye exam to monitor for  vascular changes associated with diabetes and will request that the report be sent to her pcp.  consult with her PCP regarding any changes in her health or new or worsening symptoms      DIET - EAT MORE FRUITS AND VEGETABLES     Exercise 150 minutes per week (moderate activity)     Prevent falls     Walk and stay active         This is a list of the screening recommended for you and due dates:  Health Maintenance  Topic Date Due   COVID-19 Vaccine (3 - Moderna risk series) 05/30/2020   DTaP/Tdap/Td vaccine (2 - Td or Tdap) 11/30/2021   Flu Shot  11/16/2022*   DEXA scan (bone density measurement)  04/05/2023*   Colon Cancer Screening  04/05/2023*   Hemoglobin A1C  10/05/2022   Eye exam for diabetics  11/27/2022   Complete foot exam   04/05/2023   Mammogram  06/17/2023   Medicare Annual Wellness Visit  09/03/2023   Pneumonia Vaccine  Completed   Zoster (Shingles) Vaccine  Completed   HPV Vaccine  Aged Out  *Topic was postponed. The date shown is not the original due date.    Advanced directives: Please bring a copy of your health care power of attorney and living will to the office to be added to your chart at your convenience.   Conditions/risks identified: Aim for 30 minutes of exercise or brisk walking, 6-8 glasses of water, and 5 servings of fruits and vegetables each day.   Next appointment: Follow up in one year for your annual wellness visit    Preventive Care 65 Years and Older, Female Preventive care refers to  lifestyle choices and visits with your health care provider that can promote health and wellness. What does preventive care include? A yearly physical exam. This is also called an annual well check. Dental exams once or twice a year. Routine eye exams. Ask your health care provider how often you should have your eyes checked. Personal lifestyle choices, including: Daily care of your teeth and gums. Regular physical activity. Eating a healthy diet. Avoiding  tobacco and drug use. Limiting alcohol use. Practicing safe sex. Taking low-dose aspirin every day. Taking vitamin and mineral supplements as recommended by your health care provider. What happens during an annual well check? The services and screenings done by your health care provider during your annual well check will depend on your age, overall health, lifestyle risk factors, and family history of disease. Counseling  Your health care provider may ask you questions about your: Alcohol use. Tobacco use. Drug use. Emotional well-being. Home and relationship well-being. Sexual activity. Eating habits. History of falls. Memory and ability to understand (cognition). Work and work Statistician. Reproductive health. Screening  You may have the following tests or measurements: Height, weight, and BMI. Blood pressure. Lipid and cholesterol levels. These may be checked every 5 years, or more frequently if you are over 19 years old. Skin check. Lung cancer screening. You may have this screening every year starting at age 44 if you have a 30-pack-year history of smoking and currently smoke or have quit within the past 15 years. Fecal occult blood test (FOBT) of the stool. You may have this test every year starting at age 52. Flexible sigmoidoscopy or colonoscopy. You may have a sigmoidoscopy every 5 years or a colonoscopy every 10 years starting at age 33. Hepatitis C blood test. Hepatitis B blood test. Sexually transmitted disease (STD) testing. Diabetes screening. This is done by checking your blood sugar (glucose) after you have not eaten for a while (fasting). You may have this done every 1-3 years. Bone density scan. This is done to screen for osteoporosis. You may have this done starting at age 86. Mammogram. This may be done every 1-2 years. Talk to your health care provider about how often you should have regular mammograms. Talk with your health care provider about your test  results, treatment options, and if necessary, the need for more tests. Vaccines  Your health care provider may recommend certain vaccines, such as: Influenza vaccine. This is recommended every year. Tetanus, diphtheria, and acellular pertussis (Tdap, Td) vaccine. You may need a Td booster every 10 years. Zoster vaccine. You may need this after age 2. Pneumococcal 13-valent conjugate (PCV13) vaccine. One dose is recommended after age 24. Pneumococcal polysaccharide (PPSV23) vaccine. One dose is recommended after age 57. Talk to your health care provider about which screenings and vaccines you need and how often you need them. This information is not intended to replace advice given to you by your health care provider. Make sure you discuss any questions you have with your health care provider. Document Released: 08/31/2015 Document Revised: 04/23/2016 Document Reviewed: 06/05/2015 Elsevier Interactive Patient Education  2017 [place] Prevention in the Home Falls can cause injuries. They can happen to people of all ages. There are many things you can do to make your home safe and to help prevent falls. What can I do on the outside of my home? Regularly fix the edges of walkways and driveways and fix any cracks. Remove anything that might make you trip as you walk through a  door, such as a raised step or threshold. Trim any bushes or trees on the path to your home. Use bright outdoor lighting. Clear any walking paths of anything that might make someone trip, such as rocks or tools. Regularly check to see if handrails are loose or broken. Make sure that both sides of any steps have handrails. Any raised decks and porches should have guardrails on the edges. Have any leaves, snow, or ice cleared regularly. Use sand or salt on walking paths during winter. Clean up any spills in your garage right away. This includes oil or grease spills. What can I do in the bathroom? Use night  lights. Install grab bars by the toilet and in the tub and shower. Do not use towel bars as grab bars. Use non-skid mats or decals in the tub or shower. If you need to sit down in the shower, use a plastic, non-slip stool. Keep the floor dry. Clean up any water that spills on the floor as soon as it happens. Remove soap buildup in the tub or shower regularly. Attach bath mats securely with double-sided non-slip rug tape. Do not have throw rugs and other things on the floor that can make you trip. What can I do in the bedroom? Use night lights. Make sure that you have a light by your bed that is easy to reach. Do not use any sheets or blankets that are too big for your bed. They should not hang down onto the floor. Have a firm chair that has side arms. You can use this for support while you get dressed. Do not have throw rugs and other things on the floor that can make you trip. What can I do in the kitchen? Clean up any spills right away. Avoid walking on wet floors. Keep items that you use a lot in easy-to-reach places. If you need to reach something above you, use a strong step stool that has a grab bar. Keep electrical cords out of the way. Do not use floor polish or wax that makes floors slippery. If you must use wax, use non-skid floor wax. Do not have throw rugs and other things on the floor that can make you trip. What can I do with my stairs? Do not leave any items on the stairs. Make sure that there are handrails on both sides of the stairs and use them. Fix handrails that are broken or loose. Make sure that handrails are as long as the stairways. Check any carpeting to make sure that it is firmly attached to the stairs. Fix any carpet that is loose or worn. Avoid having throw rugs at the top or bottom of the stairs. If you do have throw rugs, attach them to the floor with carpet tape. Make sure that you have a light switch at the top of the stairs and the bottom of the stairs. If  you do not have them, ask someone to add them for you. What else can I do to help prevent falls? Wear shoes that: Do not have high heels. Have rubber bottoms. Are comfortable and fit you well. Are closed at the toe. Do not wear sandals. If you use a stepladder: Make sure that it is fully opened. Do not climb a closed stepladder. Make sure that both sides of the stepladder are locked into place. Ask someone to hold it for you, if possible. Clearly mark and make sure that you can see: Any grab bars or handrails. First and last steps.  Where the edge of each step is. Use tools that help you move around (mobility aids) if they are needed. These include: Canes. Walkers. Scooters. Crutches. Turn on the lights when you go into a dark area. Replace any light bulbs as soon as they burn out. Set up your furniture so you have a clear path. Avoid moving your furniture around. If any of your floors are uneven, fix them. If there are any pets around you, be aware of where they are. Review your medicines with your doctor. Some medicines can make you feel dizzy. This can increase your chance of falling. Ask your doctor what other things that you can do to help prevent falls. This information is not intended to replace advice given to you by your health care provider. Make sure you discuss any questions you have with your health care provider. Document Released: 05/31/2009 Document Revised: 01/10/2016 Document Reviewed: 09/08/2014 Elsevier Interactive Patient Education  2017 [employer].

## 2022-09-04 NOTE — Progress Notes (Signed)
Carelink Summary Report / Loop Recorder

## 2022-09-22 DIAGNOSIS — D492 Neoplasm of unspecified behavior of bone, soft tissue, and skin: Secondary | ICD-10-CM | POA: Diagnosis not present

## 2022-09-22 DIAGNOSIS — C44729 Squamous cell carcinoma of skin of left lower limb, including hip: Secondary | ICD-10-CM | POA: Diagnosis not present

## 2022-09-22 DIAGNOSIS — L821 Other seborrheic keratosis: Secondary | ICD-10-CM | POA: Diagnosis not present

## 2022-09-30 LAB — HM DIABETES EYE EXAM

## 2022-10-05 LAB — CUP PACEART REMOTE DEVICE CHECK
Date Time Interrogation Session: 20240214233824
Implantable Pulse Generator Implant Date: 20200819

## 2022-10-06 ENCOUNTER — Ambulatory Visit (INDEPENDENT_AMBULATORY_CARE_PROVIDER_SITE_OTHER): Payer: Medicare Other

## 2022-10-06 ENCOUNTER — Encounter: Payer: Self-pay | Admitting: Nurse Practitioner

## 2022-10-06 ENCOUNTER — Ambulatory Visit (INDEPENDENT_AMBULATORY_CARE_PROVIDER_SITE_OTHER): Payer: Medicare Other | Admitting: Nurse Practitioner

## 2022-10-06 VITALS — BP 144/65 | HR 75 | Temp 98.6°F | Resp 20 | Ht 66.0 in | Wt 148.0 lb

## 2022-10-06 DIAGNOSIS — F5101 Primary insomnia: Secondary | ICD-10-CM

## 2022-10-06 DIAGNOSIS — I4819 Other persistent atrial fibrillation: Secondary | ICD-10-CM

## 2022-10-06 DIAGNOSIS — R600 Localized edema: Secondary | ICD-10-CM

## 2022-10-06 DIAGNOSIS — E782 Mixed hyperlipidemia: Secondary | ICD-10-CM | POA: Diagnosis not present

## 2022-10-06 DIAGNOSIS — Z0000 Encounter for general adult medical examination without abnormal findings: Secondary | ICD-10-CM

## 2022-10-06 DIAGNOSIS — E876 Hypokalemia: Secondary | ICD-10-CM

## 2022-10-06 DIAGNOSIS — I10 Essential (primary) hypertension: Secondary | ICD-10-CM | POA: Diagnosis not present

## 2022-10-06 DIAGNOSIS — E119 Type 2 diabetes mellitus without complications: Secondary | ICD-10-CM | POA: Diagnosis not present

## 2022-10-06 DIAGNOSIS — K219 Gastro-esophageal reflux disease without esophagitis: Secondary | ICD-10-CM

## 2022-10-06 DIAGNOSIS — I110 Hypertensive heart disease with heart failure: Secondary | ICD-10-CM

## 2022-10-06 DIAGNOSIS — M545 Low back pain, unspecified: Secondary | ICD-10-CM

## 2022-10-06 DIAGNOSIS — Z780 Asymptomatic menopausal state: Secondary | ICD-10-CM | POA: Diagnosis not present

## 2022-10-06 DIAGNOSIS — I5032 Chronic diastolic (congestive) heart failure: Secondary | ICD-10-CM | POA: Diagnosis not present

## 2022-10-06 DIAGNOSIS — G8929 Other chronic pain: Secondary | ICD-10-CM

## 2022-10-06 DIAGNOSIS — Z0001 Encounter for general adult medical examination with abnormal findings: Secondary | ICD-10-CM | POA: Diagnosis not present

## 2022-10-06 DIAGNOSIS — E785 Hyperlipidemia, unspecified: Secondary | ICD-10-CM | POA: Diagnosis not present

## 2022-10-06 DIAGNOSIS — E039 Hypothyroidism, unspecified: Secondary | ICD-10-CM

## 2022-10-06 DIAGNOSIS — Z6826 Body mass index (BMI) 26.0-26.9, adult: Secondary | ICD-10-CM

## 2022-10-06 DIAGNOSIS — M8580 Other specified disorders of bone density and structure, unspecified site: Secondary | ICD-10-CM

## 2022-10-06 DIAGNOSIS — R609 Edema, unspecified: Secondary | ICD-10-CM | POA: Diagnosis not present

## 2022-10-06 LAB — BAYER DCA HB A1C WAIVED: HB A1C (BAYER DCA - WAIVED): 7.7 % — ABNORMAL HIGH (ref 4.8–5.6)

## 2022-10-06 MED ORDER — POTASSIUM CHLORIDE CRYS ER 20 MEQ PO TBCR: 20.0000 meq | EXTENDED_RELEASE_TABLET | Freq: Every day | ORAL | 1 refills | Status: DC

## 2022-10-06 MED ORDER — BENAZEPRIL HCL 40 MG PO TABS: 40.0000 mg | ORAL_TABLET | Freq: Every day | ORAL | 1 refills | Status: DC

## 2022-10-06 MED ORDER — RALOXIFENE HCL 60 MG PO TABS: 60.0000 mg | ORAL_TABLET | Freq: Every day | ORAL | 1 refills | Status: DC

## 2022-10-06 MED ORDER — FAMOTIDINE 20 MG PO TABS: 20.0000 mg | ORAL_TABLET | Freq: Every day | ORAL | 1 refills | Status: DC

## 2022-10-06 MED ORDER — CLONIDINE HCL 0.2 MG PO TABS: 0.2000 mg | ORAL_TABLET | Freq: Two times a day (BID) | ORAL | 1 refills | Status: DC

## 2022-10-06 MED ORDER — LEVOTHYROXINE SODIUM 75 MCG PO TABS: 75.0000 ug | ORAL_TABLET | Freq: Every day | ORAL | 1 refills | Status: DC

## 2022-10-06 MED ORDER — RIVAROXABAN 15 MG PO TABS: 15.0000 mg | ORAL_TABLET | Freq: Every day | ORAL | 1 refills | Status: DC

## 2022-10-06 MED ORDER — TRAMADOL HCL 50 MG PO TABS: 50.0000 mg | ORAL_TABLET | Freq: Three times a day (TID) | ORAL | 2 refills | Status: DC | PRN

## 2022-10-06 MED ORDER — LOVASTATIN 40 MG PO TABS: 40.0000 mg | ORAL_TABLET | Freq: Every day | ORAL | 0 refills | Status: DC

## 2022-10-06 MED ORDER — FUROSEMIDE 40 MG PO TABS: 40.0000 mg | ORAL_TABLET | Freq: Every day | ORAL | 1 refills | Status: DC

## 2022-10-06 MED ORDER — PANTOPRAZOLE SODIUM 40 MG PO TBEC: 40.0000 mg | DELAYED_RELEASE_TABLET | Freq: Every day | ORAL | 1 refills | Status: DC

## 2022-10-06 NOTE — Patient Instructions (Signed)

## 2022-10-06 NOTE — Progress Notes (Signed)
Subjective:    Patient ID: Katie [name], female    DOB: [birth year], 89 y.o.   MRN: [id number]   Chief Complaint: medical management of chronic issues     HPI:  Katie [name] is a 89 y.o. who identifies as a female who was assigned female at birth.   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 Primary hypertension No c/o chest pain, sob or headache. She does check bloodpresure at home and runs over [id number] systolic most of the time. BP Readings from Last 3 Encounters:  06/27/22 (!) 156/78  06/10/22 (!) 160/62  04/04/22 (!) 142/88     2. Chronic diastolic CHF (congestive heart failure) ([place]) Patient has a lop recorder in that cardiolgy checks monthly. Last physical appt with cardiology was on 10/18/21. She has follow up in 1 week  3. Persistent atrial fibrillation ([place]) She is on xeralto daily with no bleeding issues.  4. Hyperlipidemia, unspecified hyperlipidemia type Does not watch diet and does no exercise. Lab Results  Component Value Date   CHOL 157 04/04/2022   HDL 70 04/04/2022   LDLCALC 66 04/04/2022   TRIG 119 04/04/2022   CHOLHDL 2.2 04/04/2022     5. Hypokalemia No c/o muscle cramps Lab Results  Component Value Date   K 4.6 04/04/2022     6. Hypomagnesemia On no magensium supplements   7. Diabetes mellitus type 2, diet-controlled ([place]) She does not check her blood sugars at home. Lab Results  Component Value Date   HGBA1C 7.3 (H) 04/04/2022     8. Acquired hypothyroidism No issuesthat she is aware of. Lab Results  Component Value Date   TSH 1.950 10/03/2021     9. Gastroesophageal reflux disease without esophagitis Is on protonix daily and is doing well.  10. Peripheral edema Has daily lower ext edema  11. Primary insomnia Has been sleeping okay without meds  12. Chronic midline low back pain without  sciatica Pain assessment: Cause of pain- low back pain Pain location- low back Pain on scale of 1-10- 5-6/10 Frequency- daily What increases pain-to much activity What makes pain Better-rest helps Effects on ADL - none Any change in general medical condition-none  Current opioids rx- ultram TID # meds rx- 90 Effectiveness of current meds-helps Adverse reactions from pain meds-none Morphine equivalent- 15MME  Pill count performed-No Last drug screen - 01/29/22 ( high risk q26m moderate risk q667mlow risk yearly ) Urine drug screen today- No [place]  If yes were their any concerning findings? - no   Overdose risk: 1   Pain contract signed on:01/31/22   13. BMI 26.0-26.9,adult No recent weight changes Wt Readings from Last 3 Encounters:  10/06/22 148 lb (67.1 kg)  09/02/22 142 lb (64.4 kg)  06/27/22 149 lb (67.6 kg)   BMI Readings from Last 3 Encounters:  10/06/22 23.89 kg/m  09/02/22 22.92 kg/m  06/27/22 24.05 kg/m      New complaints: None today  Allergies  Allergen Reactions   Shellfish Allergy Other (See Comments)   Amlodipine Swelling   Macrodantin Nausea And Vomiting   Metformin And Related Nausea And Vomiting and Other (See Comments)    Bloating   Penicillins Rash    Did it involve swelling of the face/tongue/throat, SOB, or low BP? Unknown Did it involve sudden or severe rash/hives, skin  peeling, or any reaction on the inside of your mouth or nose? Yes Did you need to seek medical attention at a hospital or doctor's office? Yes When did it last happen? 2005  If all above answers are "NO", may proceed with cephalosporin use.    Outpatient Encounter Medications as of 10/06/2022  Medication Sig   Alum & Mag Hydroxide-Simeth (GI COCKTAIL) SUSP suspension Take 30 mLs by mouth at bedtime. Shake well.   benazepril (LOTENSIN) 40 MG tablet Take 1 tablet (40 mg total) by mouth at bedtime.   Cholecalciferol (VITAMIN D-3) 125 MCG (5000  UT) TABS Take 5,000 Units by mouth daily.   cloNIDine (CATAPRES) 0.1 MG tablet Take 1 tablet (0.1 mg total) by mouth 2 (two) times daily.   famotidine (PEPCID) 20 MG tablet Take 1 tablet (20 mg total) by mouth at bedtime.   feeding supplement, ENSURE ENLIVE, (ENSURE ENLIVE) LIQD Take 237 mLs by mouth 2 (two) times daily between meals.   fluticasone (FLONASE) 50 MCG/ACT nasal spray USE 2 SPRAYS IN EACH NOSTRIL ONCE DAILY.   furosemide (LASIX) 40 MG tablet Take 1 tablet (40 mg total) by mouth daily.   levothyroxine (SYNTHROID) 75 MCG tablet Take 1 tablet (75 mcg total) by mouth daily.   lovastatin (MEVACOR) 40 MG tablet TAKE ONE TABLET AT BEDTIME   Multiple Vitamin (MULTIVITAMIN WITH MINERALS) TABS tablet Take 1 tablet by mouth daily.   pantoprazole (PROTONIX) 40 MG tablet Take 1 tablet (40 mg total) by mouth daily.   phenazopyridine (PYRIDIUM) 95 MG tablet Take 1 tablet (95 mg total) by mouth 3 (three) times daily as needed for pain. (Patient not taking: Reported on 09/02/2022)   potassium chloride SA (KLOR-CON M) 20 MEQ tablet Take 1 tablet (20 mEq total) by mouth daily.   raloxifene (EVISTA) 60 MG tablet Take 1 tablet (60 mg total) by mouth daily.   Rivaroxaban (XARELTO) 15 MG TABS tablet Take 1 tablet (15 mg total) by mouth daily with supper.   traMADol (ULTRAM) 50 MG tablet Take 50 mg by mouth 3 (three) times daily as needed.   No facility-administered encounter medications on file as of 10/06/2022.    Past Surgical History:  Procedure Laterality Date   A-FLUTTER ABLATION N/A 02/08/2019   Procedure: A-FLUTTER ABLATION;  Surgeon: Thompson Grayer, MD;  Location: [facility];  Service: Cardiovascular;  Laterality: N/A;   ABDOMINAL HYSTERECTOMY     APPENDECTOMY  1980   BACK SURGERY     CATARACT EXTRACTION, BILATERAL     CHOLECYSTECTOMY  5/00   COLONOSCOPY     implantable loop recorder placement  04/06/2019   MDT Reveal LINQ [id number]) implanted for evaluation of palpitations  and afib post atrial flutter ablation by Dr Rayann Heman in office   INTRAMEDULLARY (IM) NAIL INTERTROCHANTERIC Left 09/23/2019   Procedure: INTRAMEDULLARY (IM) NAIL INTERTROCHANTRIC;  Surgeon: Shona Needles, MD;  Location: [facility];  Service: Orthopedics;  Laterality: Left;   TONSILLECTOMY     TOTAL ABDOMINAL HYSTERECTOMY W/ BILATERAL SALPINGOOPHORECTOMY  1980   UPPER GASTROINTESTINAL ENDOSCOPY      Family History  Problem Relation Age of Onset   Diabetes Mother    Stroke Mother    Heart disease Father    Stroke Father    Uterine cancer Sister    Diabetes Sister    Ovarian cancer Sister    Colon cancer Sister    Diabetes Sister    Liver cancer Sister        \  Atrial fibrillation Sister    Diabetes Sister    Diabetes Brother    Dementia Brother    Diabetes Son    Healthy Son    Heart attack Neg Hx    Hypertension Neg Hx    Esophageal cancer Neg Hx    Rectal cancer Neg Hx    Stomach cancer Neg Hx    Breast cancer Neg Hx         Review of Systems  Constitutional:  Negative for diaphoresis.  Eyes:  Negative for pain.  Respiratory:  Negative for shortness of breath.   Cardiovascular:  Negative for chest pain, palpitations and leg swelling.  Gastrointestinal:  Negative for abdominal pain.  Endocrine: Negative for polydipsia.  Musculoskeletal:  Positive for back pain.  Skin:  Negative for rash.  Neurological:  Negative for dizziness, weakness and headaches.  Hematological:  Does not bruise/bleed easily.  All other systems reviewed and are negative.      Objective:   Physical Exam Vitals and nursing note reviewed.  Constitutional:      General: She is not in acute distress.    Appearance: Normal appearance. She is well-developed.  HENT:     Head: Normocephalic.     Right Ear: Tympanic membrane normal.     Left Ear: Tympanic membrane normal.     Nose: Nose normal.     Mouth/Throat:     Mouth: Mucous membranes are moist.  Eyes:     Pupils: Pupils are equal, round,  and reactive to light.  Neck:     Vascular: No carotid bruit or JVD.  Cardiovascular:     Rate and Rhythm: Normal rate and regular rhythm.     Heart sounds: Normal heart sounds.  Pulmonary:     Effort: Pulmonary effort is normal. No respiratory distress.     Breath sounds: Normal breath sounds. No wheezing or rales.  Chest:     Chest wall: No tenderness.  Abdominal:     General: Bowel sounds are normal. There is no distension or abdominal bruit.     Palpations: Abdomen is soft. There is no hepatomegaly, splenomegaly, mass or pulsatile mass.     Tenderness: There is no abdominal tenderness.  Musculoskeletal:        General: Normal range of motion.     Cervical back: Normal range of motion and neck supple.     Right lower leg: Edema (1+) present.     Left lower leg: Edema (1+) present.     Comments: Ambulates with cane   Lymphadenopathy:     Cervical: No cervical adenopathy.  Skin:    General: Skin is warm and dry.  Neurological:     Mental Status: She is alert and oriented to person, place, and time.     Deep Tendon Reflexes: Reflexes are normal and symmetric.  Psychiatric:        Behavior: Behavior normal.        Thought Content: Thought content normal.        Judgment: Judgment normal.     BP (!) 144/65   Pulse 75   Temp 98.6 F (37 C) (Temporal)   Resp 20   Ht 5' 6"$  (1.676 m)   Wt 148 lb (67.1 kg)   SpO2 97%   BMI 23.89 kg/m   Hgba1c 7.7%     Assessment & Plan:   Katie [name] in today with chief complaint of Annual Exam   1. Annual physical exam  2. Primary hypertension  Low sodium diet Increased clonidine  to 0.71m bid Continue to keep diary of bloodpressure - CBC with Differential/Platelet - CMP14+EGFR - benazepril (LOTENSIN) 40 MG tablet; Take 1 tablet (40 mg total) by mouth at bedtime.  Dispense: 90 tablet; Refill: 1 - cloNIDine (CATAPRES) 0.2 MG tablet; Take 1 tablet (0.2 mg total) by mouth 2 (two) times daily.  Dispense: 180 tablet; Refill:  1  3. Chronic diastolic CHF (congestive heart failure) ([place] Keep follow up with cardiology  4. Mixed hyperlipidemia Low fat diet - Lipid panel - lovastatin (MEVACOR) 40 MG tablet; Take 1 tablet (40 mg total) by mouth at bedtime.  Dispense: 90 tablet; Refill: 0  5. Persistent atrial fibrillation ([facility]) Avoid caffeine - Rivaroxaban (XARELTO) 15 MG TABS tablet; Take 1 tablet (15 mg total) by mouth daily with supper.  Dispense: 90 tablet; Refill: 1  6. Hypokalemia Labs pending - potassium chloride SA (KLOR-CON M) 20 MEQ tablet; Take 1 tablet (20 mEq total) by mouth daily.  Dispense: 90 tablet; Refill: 1  7. Hypomagnesemia Labs pending  8. Diabetes mellitus type 2, diet-controlled ([place] Continue to watch carbs in diet - Bayer DCA Hb A1c Waived - Microalbumin / creatinine urine ratio  9. Acquired hypothyroidism Labs pending - Thyroid Panel With TSH - levothyroxine (SYNTHROID) 75 MCG tablet; Take 1 tablet (75 mcg total) by mouth daily.  Dispense: 90 tablet; Refill: 1  10. Gastroesophageal reflux disease without esophagitis Avoid spicy foods Do not eat 2 hours prior to bedtime - famotidine (PEPCID) 20 MG tablet; Take 1 tablet (20 mg total) by mouth at bedtime.  Dispense: 90 tablet; Refill: 1 - pantoprazole (PROTONIX) 40 MG tablet; Take 1 tablet (40 mg total) by mouth daily.  Dispense: 90 tablet; Refill: 1  11. Peripheral edema Elevate legs when sitting - furosemide (LASIX) 40 MG tablet; Take 1 tablet (40 mg total) by mouth daily.  Dispense: 90 tablet; Refill: 1  12. Primary insomnia Bedtime routine  13. Chronic midline low back pain without sciatica - traMADol (ULTRAM) 50 MG tablet; Take 1 tablet (50 mg total) by mouth 3 (three) times daily as needed.  Dispense: 90 tablet; Refill: 2  14. BMI 26.0-26.9,adult Mary-Margaret MHassell Done FNP   15. Osteopenia, senile - raloxifene (EVISTA) 60 MG tablet; Take 1 tablet (60 mg total) by mouth daily.  Dispense: 90 tablet; Refill:  1    The above assessment and management plan was discussed with the patient. The patient verbalized understanding of and has agreed to the management plan. Patient is aware to call the clinic if symptoms persist or worsen. Patient is aware when to return to the clinic for a follow-up visit. Patient educated on when it is appropriate to go to the emergency department.   Mary-Margaret MHassell Done FNP

## 2022-10-07 DIAGNOSIS — M810 Age-related osteoporosis without current pathological fracture: Secondary | ICD-10-CM | POA: Diagnosis not present

## 2022-10-07 LAB — CBC WITH DIFFERENTIAL/PLATELET
Basophils Absolute: 0 10*3/uL (ref 0.0–0.2)
Basos: 0 %
EOS (ABSOLUTE): 0.2 10*3/uL (ref 0.0–0.4)
Eos: 2 %
Hematocrit: 37.5 % (ref 34.0–46.6)
Hemoglobin: 12.4 g/dL (ref 11.1–15.9)
Immature Grans (Abs): 0 10*3/uL (ref 0.0–0.1)
Immature Granulocytes: 0 %
Lymphocytes Absolute: 2.6 10*3/uL (ref 0.7–3.1)
Lymphs: 37 %
MCH: 32.1 pg (ref 26.6–33.0)
MCHC: 33.1 g/dL (ref 31.5–35.7)
MCV: 97 fL (ref 79–97)
Monocytes Absolute: 0.5 10*3/uL (ref 0.1–0.9)
Monocytes: 6 %
Neutrophils Absolute: 3.8 10*3/uL (ref 1.4–7.0)
Neutrophils: 55 %
Platelets: 230 10*3/uL (ref 150–450)
RBC: 3.86 x10E6/uL (ref 3.77–5.28)
RDW: 12.5 % (ref 11.7–15.4)
WBC: 7 10*3/uL (ref 3.4–10.8)

## 2022-10-07 LAB — CMP14+EGFR
ALT: 10 IU/L (ref 0–32)
AST: 23 IU/L (ref 0–40)
Albumin/Globulin Ratio: 2.1 (ref 1.2–2.2)
Albumin: 4.4 g/dL (ref 3.7–4.7)
Alkaline Phosphatase: 76 IU/L (ref 44–121)
BUN/Creatinine Ratio: 14 (ref 12–28)
BUN: 16 mg/dL (ref 8–27)
Bilirubin Total: 0.3 mg/dL (ref 0.0–1.2)
CO2: 24 mmol/L (ref 20–29)
Calcium: 9.4 mg/dL (ref 8.7–10.3)
Chloride: 99 mmol/L (ref 96–106)
Creatinine, Ser: 1.11 mg/dL — ABNORMAL HIGH (ref 0.57–1.00)
Globulin, Total: 2.1 g/dL (ref 1.5–4.5)
Glucose: 124 mg/dL — ABNORMAL HIGH (ref 70–99)
Potassium: 4.3 mmol/L (ref 3.5–5.2)
Sodium: 137 mmol/L (ref 134–144)
Total Protein: 6.5 g/dL (ref 6.0–8.5)
eGFR: 48 mL/min/{1.73_m2} — ABNORMAL LOW (ref >59)

## 2022-10-07 LAB — THYROID PANEL WITH TSH
Free Thyroxine Index: 2.5 (ref 1.2–4.9)
T3 Uptake Ratio: 28 % (ref 24–39)
T4, Total: 9.1 ug/dL (ref 4.5–12.0)
TSH: 3.08 u[IU]/mL (ref 0.450–4.500)

## 2022-10-07 LAB — LIPID PANEL
Chol/HDL Ratio: 2.2 ratio (ref 0.0–4.4)
Cholesterol, Total: 141 mg/dL (ref 100–199)
HDL: 65 mg/dL (ref >39)
LDL Chol Calc (NIH): 53 mg/dL (ref 0–99)
Triglycerides: 134 mg/dL (ref 0–149)
VLDL Cholesterol Cal: 23 mg/dL (ref 5–40)

## 2022-10-07 LAB — MICROALBUMIN / CREATININE URINE RATIO
Creatinine, Urine: 17 mg/dL
Microalb/Creat Ratio: 21 mg/g creat (ref 0–29)
Microalbumin, Urine: 3.5 ug/mL

## 2022-10-14 NOTE — Progress Notes (Signed)
Carelink Summary Report / Loop Recorder 

## 2022-10-21 DIAGNOSIS — D0472 Carcinoma in situ of skin of left lower limb, including hip: Secondary | ICD-10-CM | POA: Diagnosis not present

## 2022-10-28 ENCOUNTER — Encounter: Payer: Self-pay | Admitting: Nurse Practitioner

## 2022-10-28 ENCOUNTER — Ambulatory Visit (INDEPENDENT_AMBULATORY_CARE_PROVIDER_SITE_OTHER): Payer: Medicare Other | Admitting: Nurse Practitioner

## 2022-10-28 VITALS — BP 177/84 | HR 85 | Temp 98.5°F | Resp 20 | Ht 66.0 in | Wt 147.0 lb

## 2022-10-28 DIAGNOSIS — I10 Essential (primary) hypertension: Secondary | ICD-10-CM | POA: Diagnosis not present

## 2022-10-28 DIAGNOSIS — R0981 Nasal congestion: Secondary | ICD-10-CM

## 2022-10-28 MED ORDER — CLONIDINE HCL 0.1 MG PO TABS: 0.1000 mg | ORAL_TABLET | Freq: Two times a day (BID) | ORAL | 1 refills | Status: DC

## 2022-10-28 MED ORDER — METOPROLOL SUCCINATE ER 25 MG PO TB24: 25.0000 mg | ORAL_TABLET | Freq: Every day | ORAL | 1 refills | Status: DC

## 2022-10-28 NOTE — Progress Notes (Signed)
Subjective:    Patient ID: Katie [name], female    DOB: October 30, 1931, 89 y.o.   MRN: [id number]   Chief Complaint: uri   URI  This is a new problem. The current episode started 1 to 4 weeks ago. The problem has been gradually worsening. There has been no fever. Associated symptoms include congestion. Pertinent negatives include no abdominal pain, chest pain, coughing, headaches, rash, rhinorrhea or sore throat. She has tried acetaminophen for the symptoms. The treatment provided mild relief.    Patient Active Problem List   Diagnosis Date Noted   Breakage of internal fixation device in bone ([place]) 11/22/2019   Displaced intertrochanteric fracture of left femur, initial encounter for closed fracture ([place]) 09/22/2019   Hypomagnesemia 08/12/2019   Regurgitation of food 07/20/2019   Chronic midline low back pain without sciatica 10/22/2018   Primary insomnia 10/22/2018   Persistent atrial fibrillation ([place]) 04/27/2017   Chronic diastolic CHF (congestive heart failure) ([place]) 04/27/2017   CKD (chronic kidney disease) stage 3, GFR 30-59 ml/min (HCC) 05/14/2015   BMI 26.0-26.9,adult 05/11/2015   Peripheral edema 07/06/2014   Hypokalemia 07/22/2013   Hypothyroidism 07/22/2013   Osteopenia, senile 03/09/2013   Constipation 01/04/2013   Hypertension 05/07/2012   Hyperlipidemia 05/07/2012   Diabetes mellitus type 2, diet-controlled ([place]) 05/07/2012   GERD (gastroesophageal reflux disease) 05/07/2012       Review of Systems  Constitutional:  Negative for diaphoresis.  HENT:  Positive for congestion. Negative for rhinorrhea and sore throat.   Eyes:  Negative for pain.  Respiratory:  Negative for cough and shortness of breath.   Cardiovascular:  Negative for chest pain, palpitations and leg swelling.  Gastrointestinal:  Negative for abdominal pain.  Endocrine: Negative for polydipsia.  Skin:  Negative for rash.  Neurological:  Negative for dizziness, weakness and headaches.   Hematological:  Does not bruise/bleed easily.  All other systems reviewed and are negative.      Objective:   Physical Exam Constitutional:      Appearance: Normal appearance.  Cardiovascular:     Rate and Rhythm: Normal rate and regular rhythm.     Heart sounds: Normal heart sounds.  Pulmonary:     Effort: Pulmonary effort is normal.     Breath sounds: Normal breath sounds.  Skin:    General: Skin is warm.  Neurological:     General: No focal deficit present.     Mental Status: She is alert and oriented to person, place, and time.  Psychiatric:        Mood and Affect: Mood normal.    BP (!) 177/84   Pulse 85   Temp 98.5 F (36.9 C) (Temporal)   Resp 20   Ht '5\' 6"'$  (1.676 m)   Wt 147 lb (66.7 kg)   SpO2 94%   BMI 23.73 kg/m        Assessment & Plan:   [name] in today with chief complaint of Dizziness (Cannot take BP meds. Made her dizzy and had to stop/) and Sinus Problem   1. Nasal congestion Continue flonase Add claritin Force fluids  2. Primary hypertension Consulted with clinical pharmacist Lottie Dawson Stop clonidine 0.2 mg BID Clonidine 0.'1mg'$  BID Metoprolol '25mg'$  1 po daily Keep diary of blood pressure-let me know next week how they are.    The above assessment and management plan was discussed with the patient. The patient verbalized understanding of and has agreed to the management plan. Patient is aware to call  the clinic if symptoms persist or worsen. Patient is aware when to return to the clinic for a follow-up visit. Patient educated on when it is appropriate to go to the emergency department.   Mary-Margaret Hassell Done, FNP

## 2022-10-28 NOTE — Patient Instructions (Signed)
Allergic Rhinitis, Adult  Allergic rhinitis is a reaction to allergens. Allergens are things that can cause an allergic reaction. This condition affects the lining inside the nose (mucous membrane). There are two types of allergic rhinitis: Seasonal. This type is also called hay fever. It happens only during some times of the year. Perennial. This type can happen at any time of the year. This condition cannot be spread from person to person (is not contagious). It can be mild, bad, or very bad. It can develop at any age and may be outgrown. What are the causes? Pollen from grasses, trees, and weeds. Other causes can be: Dust mites. Smoke. Mold. Car fumes. The pee (urine), spit, or dander of pets. Dander is dead skin cells from a pet. What increases the risk? You are more likely to develop this condition if: You have allergies in your family. You have problems like allergies in your family. You may have: Swelling of parts of your eyes and eyelids. Asthma. This affects how you breathe. Long-term redness and swelling on your skin. Food allergies. What are the signs or symptoms? The main symptom of this condition is a runny or stuffy nose (nasal congestion). Other symptoms may include: Sneezing or coughing. Itching and tearing of your eyes. Mucus that drips down the back of your throat (postnasal drip). This may cause a sore throat. Trouble sleeping. Feeling tired. Headache. How is this treated? There is no cure for this condition. You should avoid things that you are allergic to. Treatment can help to relieve symptoms. This may include: Medicines that block allergy symptoms, such as anti-inflammatories or antihistamines. These may be given as a shot, nasal spray, or pill. Avoiding things you are allergic to. Medicines that give you some of what you are allergic to over time. This is called immunotherapy. It is done if other treatments do not help. You may get: Shots. Medicine under  your tongue. Stronger medicines, if other treatments do not help. Follow these instructions at home: Avoiding allergens Find out what things you are allergic to and avoid them. To do this, try these things: If you get allergies any time of year: Replace carpet with wood, tile, or vinyl flooring. Carpet can trap pet dander and dust. Do not smoke. Do not allow smoking in your home. Change your heating and air conditioning filters at least once a month. If you get allergies only some times of the year: Keep windows closed when you can. Plan things to do outside when pollen counts are lowest. Check pollen counts before you plan things to do outside. When you come indoors, change your clothes and shower before you sit on furniture or bedding. If you are allergic to a pet: Keep the pet out of your bedroom. Vacuum, sweep, and dust often. General instructions Take over-the-counter and prescription medicines only as told by your doctor. Drink enough fluid to keep your pee pale yellow. Where to find more information American Academy of Allergy, Asthma & Immunology: aaaai.org Contact a doctor if: You have a fever. You get a cough that does not go away. You make high-pitched whistling sounds when you breathe, most often when you breathe out (wheeze). Your symptoms slow you down. Your symptoms stop you from doing your normal things each day. Get help right away if: You are short of breath. This symptom may be an emergency. Get help right away. Call 911. Do not wait to see if the symptom will go away. Do not drive yourself to the   hospital. This information is not intended to replace advice given to you by your health care provider. Make sure you discuss any questions you have with your health care provider. Document Revised: 04/14/2022 Document Reviewed: 04/14/2022 Elsevier Patient Education  2023 Elsevier Inc.  

## 2022-11-08 LAB — CUP PACEART REMOTE DEVICE CHECK
Date Time Interrogation Session: 20240319003652
Implantable Pulse Generator Implant Date: 20200819

## 2022-11-10 ENCOUNTER — Ambulatory Visit (INDEPENDENT_AMBULATORY_CARE_PROVIDER_SITE_OTHER): Payer: Medicare Other

## 2022-11-10 DIAGNOSIS — I495 Sick sinus syndrome: Secondary | ICD-10-CM

## 2022-11-19 NOTE — Progress Notes (Signed)
Carelink Summary Report / Loop Recorder 

## 2022-11-28 ENCOUNTER — Telehealth: Payer: Self-pay

## 2022-11-28 NOTE — Telephone Encounter (Signed)
Discussed with patient, her loop being at RRT patient became very concerned about her heart not being monitored and would like apt to discuss with provider to discuss about possibly getting another one. Patient apt scheduled for 11/2622 with Francis Dowse as Dr. Geannie Risen schedule is booked out until late June, and patient would like to see provider earlier than that.

## 2022-12-01 DIAGNOSIS — H10013 Acute follicular conjunctivitis, bilateral: Secondary | ICD-10-CM | POA: Diagnosis not present

## 2022-12-12 ENCOUNTER — Encounter: Payer: Self-pay | Admitting: Physician Assistant

## 2022-12-12 ENCOUNTER — Ambulatory Visit: Payer: Medicare Other | Attending: Physician Assistant | Admitting: Physician Assistant

## 2022-12-12 VITALS — BP 136/70 | HR 65 | Ht 66.0 in | Wt 150.0 lb

## 2022-12-12 DIAGNOSIS — Z4509 Encounter for adjustment and management of other cardiac device: Secondary | ICD-10-CM | POA: Diagnosis not present

## 2022-12-12 DIAGNOSIS — I440 Atrioventricular block, first degree: Secondary | ICD-10-CM

## 2022-12-12 DIAGNOSIS — I4819 Other persistent atrial fibrillation: Secondary | ICD-10-CM

## 2022-12-12 DIAGNOSIS — Z79899 Other long term (current) drug therapy: Secondary | ICD-10-CM

## 2022-12-12 DIAGNOSIS — I10 Essential (primary) hypertension: Secondary | ICD-10-CM

## 2022-12-12 LAB — CUP PACEART INCLINIC DEVICE CHECK
Date Time Interrogation Session: 20240426170906
Implantable Pulse Generator Implant Date: 20200819

## 2022-12-12 MED ORDER — HYDRALAZINE HCL 10 MG PO TABS: 10.0000 mg | ORAL_TABLET | Freq: Two times a day (BID) | ORAL | 2 refills | Status: DC

## 2022-12-12 NOTE — Patient Instructions (Addendum)
Medication Instructions:   START TAKING: HYDRALAZINE 10 MG TWICE A DAY   STOP TAKING AND REMOVE THIS MEDICATION FROM YOUR MEDICATION LIST:  METOPROLOL 25 MG    *If you need a refill on your cardiac medications before your next appointment, please call your pharmacy*   Lab Work:  BMET AND CBC TODAY     If you have labs (blood work) drawn today and your tests are completely normal, you will receive your results only by: MyChart Message (if you have MyChart) OR A paper copy in the mail If you have any lab test that is abnormal or we need to change your treatment, we will call you to review the results.   Testing/Procedures: NONE ORDERED  TODAY    Follow-Up: At [facility], you and your health needs are our priority.  As part of our continuing mission to provide you with exceptional heart care, we have created designated Provider Care Teams.  These Care Teams include your primary Cardiologist (physician) and Advanced Practice Providers (APPs -  Physician Assistants and Nurse Practitioners) who all work together to provide you with the care you need, when you need it.  We recommend signing up for the patient portal called "MyChart".  Sign up information is provided on this After Visit Summary.  MyChart is used to connect with patients for Virtual Visits (Telemedicine).  Patients are able to view lab/test results, encounter notes, upcoming appointments, etc.  Non-urgent messages can be sent to your provider as well.   To learn more about what you can do with MyChart, go to [email or web address].    Your next appointment:  NURSE VISITS EKG/ BLOOD PRESSURE   2 week(s)                               AND    6-8 WEEKS  WITH   Provider:   Steffanie Dunn, MD    Other Instructions

## 2022-12-12 NOTE — Progress Notes (Signed)
Cardiology Office Note Date:  12/12/2022  Patient ID:  Katie [name], Katie [name] 1932/07/11, MRN [id number] PCP:  Bennie Pierini, FNP  Electrophysiologist: Dr. Lalla Brothers    Chief Complaint: discuss loop RRT options  History of Present Illness: Katie [name] is a 89 y.o. female with history of HTN, HLD, DM (diet managed), CKD (III), AFlutter (ablated) > AFib, hypothyroidism  Referred to Dr. Johney Frame for AFib, 2020, noted by his review only AFlutter > not typical by EKG though did suspect was isthmus dependent and ablated 02/08/2019  Last saw Dr. Johney Frame Sept 2020 via telehealth visit, no AFib via loop, and at this time, still no clear Afib noted and her OAC stopped  Appears afib found via device 2021  She saw A. Tillery, PA-C  10/18/21 for BP generally high, plenty of salt in her diet, <1% AF burden, , adjusted timing of her BP meds, volume stable, ON XARELTO and continued  This appears to be her last visit in the office Loop monitoring transitioned from Dr. Johney Frame > Dr. Lalla Brothers, has not seen him yet  Device RRT April 2024, pt uncomfortable not having monitoring of her heart rhythm, asked to be seen to discuss option of explant > new implant of loop.  TODAY She is accompanied by her son. She is quite anxious about her battery being low/depleted on her monitor. Unclear, suspect she was under the impression the monitor perhaps did more then purley monitoring We discuss that today, she feels much more at eas, but still would like it replaced.  She has not had any palpitations since her ablation, back then was racing that made her feel awful.  She denies any exertional intolerances, no SOB, DOE No CP No bleeding or signs of bleeding No dizzy spells, near syncope or syncope.  Her BP has been high, she has been on clonidine and benazepril for years, metoprolol started about a month ago via her PMD, BP has finally started to settle down    Device information MDT LINQ II implanted  04/06/2019 for AFib surveillance   AFib/AAD hx Coarse AFib/AFlutter found 2017 CTI ablation 02/08/2019 AFib noted 2021  Past Medical History:  Diagnosis Date   Anxiety    Aortic insufficiency    a. Trivial AI by echo 02/2016.   Atrial fibrillation and flutter (HCC)    a. Coarse afib vs flutter by EKG 12/2015.   Cancer (HCC)    skin cancer   Cataract    Chronic diastolic CHF (congestive heart failure) (HCC)    CKD (chronic kidney disease), stage III (HCC)    COVID-19    GERD (gastroesophageal reflux disease)    Hiatal hernia    Hypercholesterolemia    Hypertension    Hypokalemia    Hypothyroidism    Left knee pain    NIDDM (non-insulin dependent diabetes mellitus)    diet controlled    Osteoporosis    Premature atrial contractions    PVC's (premature ventricular contractions)    Vitamin D deficiency     Past Surgical History:  Procedure Laterality Date   A-FLUTTER ABLATION N/A 02/08/2019   Procedure: A-FLUTTER ABLATION;  Surgeon: Hillis Range, MD;  Location: [facility];  Service: Cardiovascular;  Laterality: N/A;   ABDOMINAL HYSTERECTOMY     APPENDECTOMY  1980   BACK SURGERY     CATARACT EXTRACTION, BILATERAL     CHOLECYSTECTOMY  5/00   COLONOSCOPY     implantable loop recorder placement  04/06/2019   MDT  Reveal LINQ [id number] ([id number]) implanted for evaluation of palpitations and afib post atrial flutter ablation by Dr Johney Frame in office   INTRAMEDULLARY (IM) NAIL INTERTROCHANTERIC Left 09/23/2019   Procedure: INTRAMEDULLARY (IM) NAIL INTERTROCHANTRIC;  Surgeon: Roby Lofts, MD;  Location: [facility] OR;  Service: Orthopedics;  Laterality: Left;   TONSILLECTOMY     TOTAL ABDOMINAL HYSTERECTOMY W/ BILATERAL SALPINGOOPHORECTOMY  1980   UPPER GASTROINTESTINAL ENDOSCOPY      Current Outpatient Medications  Medication Sig Dispense Refill   Alum & Mag Hydroxide-Simeth (GI COCKTAIL) SUSP suspension Take 30 mLs by mouth at bedtime. Shake well. 900 mL 2   benazepril  (LOTENSIN) 40 MG tablet Take 1 tablet (40 mg total) by mouth at bedtime. 90 tablet 1   Cholecalciferol (VITAMIN D-3) 125 MCG (5000 UT) TABS Take 5,000 Units by mouth daily. 90 tablet 1   cloNIDine (CATAPRES) 0.1 MG tablet Take 1 tablet (0.1 mg total) by mouth 2 (two) times daily. 180 tablet 1   famotidine (PEPCID) 20 MG tablet Take 1 tablet (20 mg total) by mouth at bedtime. 90 tablet 1   feeding supplement, ENSURE ENLIVE, (ENSURE ENLIVE) LIQD Take 237 mLs by mouth 2 (two) times daily between meals.     fluticasone (FLONASE) 50 MCG/ACT nasal spray USE 2 SPRAYS IN EACH NOSTRIL ONCE DAILY. 16 g 6   furosemide (LASIX) 40 MG tablet Take 1 tablet (40 mg total) by mouth daily. 90 tablet 1   levothyroxine (SYNTHROID) 75 MCG tablet Take 1 tablet (75 mcg total) by mouth daily. 90 tablet 1   lovastatin (MEVACOR) 40 MG tablet Take 1 tablet (40 mg total) by mouth at bedtime. 90 tablet 0   metoprolol succinate (TOPROL-XL) 25 MG 24 hr tablet Take 1 tablet (25 mg total) by mouth daily. 90 tablet 1   Multiple Vitamin (MULTIVITAMIN WITH MINERALS) TABS tablet Take 1 tablet by mouth daily.     pantoprazole (PROTONIX) 40 MG tablet Take 1 tablet (40 mg total) by mouth daily. 90 tablet 1   potassium chloride SA (KLOR-CON M) 20 MEQ tablet Take 1 tablet (20 mEq total) by mouth daily. 90 tablet 1   raloxifene (EVISTA) 60 MG tablet Take 1 tablet (60 mg total) by mouth daily. 90 tablet 1   Rivaroxaban (XARELTO) 15 MG TABS tablet Take 1 tablet (15 mg total) by mouth daily with supper. 90 tablet 1   traMADol (ULTRAM) 50 MG tablet Take 1 tablet (50 mg total) by mouth 3 (three) times daily as needed. 90 tablet 2   No current facility-administered medications for this visit.    Allergies:   Shellfish allergy, Amlodipine, Macrodantin, Metformin and related, and Penicillins   Social History:  The patient  reports that she has never smoked. She has never used smokeless tobacco. She reports that she does not drink alcohol and  does not use drugs.   Family History:  The patient's family history includes Atrial fibrillation in her sister; Colon cancer in her sister; Dementia in her brother; Diabetes in her brother, mother, sister, sister, sister, and son; Healthy in her son; Heart disease in her father; Liver cancer in her sister; Ovarian cancer in her sister; Stroke in her father and mother; Uterine cancer in her sister.  ROS:  Please see the history of present illness.    All other systems are reviewed and otherwise negative.   PHYSICAL EXAM:  VS:  There were no vitals taken for this visit. BMI: There is no height or weight on file to  calculate BMI. Well nourished, well developed, in no acute distress HEENT: normocephalic, atraumatic Neck: no JVD, carotid bruits or masses Cardiac:   RRR; no significant murmurs, no rubs, or gallops Lungs:   CTA b/l, no wheezing, rhonchi or rales Abd: soft, nontender MS: no deformity or atrophy Ext: no edema Skin: warm and dry, no rash Neuro:  No gross deficits appreciated Psych: euthymic mood, full affect  ILR site is stable, no tethering or discomfort   EKG:  Done today and reviewed by myself shows  SR 65bpm, long 1st degree AVBlock , QRS 92ms, PVC  Device interrogation done today and reviewed by myself:  Battery reached RRT 11/27/22 7 AF episodes, fairly brief Huston Foley and pause turned on  04/01/2019: TTE 1. The left ventricle has normal systolic function with an ejection  fraction of 60-65%. The cavity size was normal. Left ventricular diastolic  Doppler parameters are consistent with impaired relaxation. Elevated mean  left atrial pressure.   2. The right ventricle has normal systolic function. The cavity was  normal.   3. The mitral valve is abnormal. There is mild mitral annular  calcification present.   4. The tricuspid valve is grossly normal.   5. The aortic valve is tricuspid. Mild thickening of the aortic valve.  Aortic valve regurgitation is mild by  color flow Doppler. No stenosis of  the aortic valve.   6. The aorta is normal unless otherwise noted.   7. Normal LV systolic function; mild diastolic dysfunction; sclerotic  aortic valve with mild eccentric AI.   02/08/2019: EPS/ablation CONCLUSIONS:   1. Isthmus-dependent clockwise right atrial flutter upon presentation.   2. Successful radiofrequency ablation of atrial flutter along the cavotricuspid isthmus with complete bidirectional isthmus block achieved.   3. No inducible arrhythmias following ablation.   4. No early apparent complications.   Recent Labs: 04/04/2022: Magnesium 2.1 10/06/2022: ALT 10; BUN 16; Creatinine, Ser 1.11; Hemoglobin 12.4; Platelets 230; Potassium 4.3; Sodium 137; TSH 3.080  10/06/2022: Chol/HDL Ratio 2.2; Cholesterol, Total 141; HDL 65; LDL Chol Calc (NIH) 53; Triglycerides 134   CrCl cannot be calculated (Patient's most recent lab result is older than the maximum 21 days allowed.).   Wt Readings from Last 3 Encounters:  10/28/22 147 lb (66.7 kg)  10/06/22 148 lb (67.1 kg)  09/02/22 142 lb (64.4 kg)     Other studies reviewed: Additional studies/records reviewed today include: summarized above  ASSESSMENT AND PLAN:  AFlutter CTI ablation 2020 Paroxysmal AFib CHA2DS2Vasc is 5, on Xarelto, appropriately dosed   <0.1 % burden   HTN Looks OK  4. Conduction system disease Marked 1st degree AVBlock, new from her EKG last year and likely 2/2 the recent addition of metoprolol  I have asked device clinic to continue to monitor her device until fully depleted. Stop metoprolol She does not want to retry amlodipine, edema was very bothersome  Would like her off clonidine as well For now keep it and add hydralazine 10mg  BID RN visit for EKG and BP in 2 weeks  We discussed need for a new EP MD Dr. Lalla Brothers has been monitoring her remotes. Will have her see him in a couple months, sooner if needed to revisit +/- loop explant and new implant May be  reasonable given her development od conduction system disease with low dose BB   Disposition: F/u as above, sooner if needed  Current medicines are reviewed at length with the patient today.  The patient did not have any concerns regarding medicines.  Norma Fredrickson, PA-C 12/12/2022 7:24 AM     [facility] [address] [place] [address] [phone] (office)  [phone] (fax)

## 2022-12-13 LAB — CBC
Hematocrit: 33.6 % — ABNORMAL LOW (ref 34.0–46.6)
Hemoglobin: 11.4 g/dL (ref 11.1–15.9)
MCH: 32.6 pg (ref 26.6–33.0)
MCHC: 33.9 g/dL (ref 31.5–35.7)
MCV: 96 fL (ref 79–97)
Platelets: 250 10*3/uL (ref 150–450)
RBC: 3.5 x10E6/uL — ABNORMAL LOW (ref 3.77–5.28)
RDW: 11.9 % (ref 11.7–15.4)
WBC: 7.3 10*3/uL (ref 3.4–10.8)

## 2022-12-13 LAB — BASIC METABOLIC PANEL
BUN/Creatinine Ratio: 15 (ref 12–28)
BUN: 17 mg/dL (ref 8–27)
CO2: 23 mmol/L (ref 20–29)
Calcium: 9.4 mg/dL (ref 8.7–10.3)
Chloride: 99 mmol/L (ref 96–106)
Creatinine, Ser: 1.12 mg/dL — ABNORMAL HIGH (ref 0.57–1.00)
Glucose: 106 mg/dL — ABNORMAL HIGH (ref 70–99)
Potassium: 4.4 mmol/L (ref 3.5–5.2)
Sodium: 140 mmol/L (ref 134–144)
eGFR: 47 mL/min/{1.73_m2} — ABNORMAL LOW (ref >59)

## 2022-12-15 ENCOUNTER — Ambulatory Visit (INDEPENDENT_AMBULATORY_CARE_PROVIDER_SITE_OTHER): Payer: Medicare Other

## 2022-12-15 DIAGNOSIS — I495 Sick sinus syndrome: Secondary | ICD-10-CM

## 2022-12-15 LAB — CUP PACEART REMOTE DEVICE CHECK
Date Time Interrogation Session: 20240428230304
Implantable Pulse Generator Implant Date: 20200819

## 2022-12-17 ENCOUNTER — Telehealth: Payer: Self-pay

## 2022-12-17 NOTE — Telephone Encounter (Signed)
Following alert received from [employer] received for ILR alert for pause Event occurred 4/29 @ 13:36, 4sec in duration, likely HB, long first degree- route to triage Known RRT.  Attempted to call patient to assess s/s and medication compliance. Please see most recent OV note with Renee, PA 12/12/22.No answer, LMTCB.

## 2022-12-19 NOTE — Telephone Encounter (Signed)
LMTCB on VM

## 2022-12-19 NOTE — Telephone Encounter (Signed)
Reviewed presenting and pause event with Dr. Lalla Brothers.  Appears suspicious for [place].  As long as patient remains asx will continue to monitor and have her come on 5/13 for NV EKG and then afterwards schedule with Dr. Lalla Brothers in office.   Francis Dowse PA-C also aware.  Patient contacted and confirmed no symptoms but if she develops any dizziness, weakness/fatigue, syncopal or near syncopal events, she is to call our office.  Device clinic number given.

## 2022-12-19 NOTE — Telephone Encounter (Signed)
Spoke with patient.  (4 second pause during waking hours on 4/29). She does not recall being symptomatic. Denies any dizziness, or near syncopal event that day.   ILR at RRT, recently saw Renee - d/c'ed metoprolol and started on Hydralazine. Patient taking all of her medications no missed doses and comes on 5/13 for EKG with nurse visit.  Renee mentions scheduling her with Dr. Lalla Brothers for re-implantation.   Forwarding to [employer] PA-C and Dr. Lalla Brothers.

## 2022-12-19 NOTE — Telephone Encounter (Signed)
Pt returning nurse call. I told her the nurse will give her a call back.

## 2022-12-22 ENCOUNTER — Telehealth: Payer: Self-pay | Admitting: Physical Medicine and Rehabilitation

## 2022-12-22 ENCOUNTER — Other Ambulatory Visit: Payer: Self-pay | Admitting: Nurse Practitioner

## 2022-12-22 DIAGNOSIS — M5416 Radiculopathy, lumbar region: Secondary | ICD-10-CM

## 2022-12-22 NOTE — Telephone Encounter (Signed)
Patient asking for an appointment to get injection in her back.

## 2022-12-22 NOTE — Progress Notes (Signed)
Carelink Summary Report / Loop Recorder 

## 2022-12-23 NOTE — Addendum Note (Signed)
Addended by: Sharlet Salina on: 12/23/2022 10:32 AM   Modules accepted: Orders

## 2022-12-23 NOTE — Telephone Encounter (Signed)
Spoke with patient and she is requesting another injection. She is having the same type of pain as before on the left side. Last injection 02/26/22 and she started to have pain again last week. Had relief of more than 75%. No new injuries or falls. Please advise

## 2022-12-23 NOTE — Telephone Encounter (Signed)
Order placed for repeat injection.

## 2022-12-29 ENCOUNTER — Ambulatory Visit: Payer: Medicare Other | Attending: Internal Medicine | Admitting: *Deleted

## 2022-12-29 VITALS — BP 156/78 | HR 72 | Resp 20 | Wt 151.0 lb

## 2022-12-29 DIAGNOSIS — I440 Atrioventricular block, first degree: Secondary | ICD-10-CM | POA: Diagnosis not present

## 2022-12-29 DIAGNOSIS — I4819 Other persistent atrial fibrillation: Secondary | ICD-10-CM

## 2022-12-29 NOTE — Progress Notes (Unsigned)
Electrophysiology Office Follow up Visit Note:    Date:  12/29/2022   ID:  MIKELL FERRANDINO, DOB 1931-11-27, MRN [id number]  PCP:  Bennie Pierini, FNP  [facility] Cardiologist:  Verne Carrow, MD  [facility] Electrophysiologist:  Lanier Prude, MD    Interval History:    Katie [name] is a 89 y.o. female who presents for a follow up visit.   Last seen 12/12/2022 by Luster Landsberg.  She has a hx of HLD, DM, CKD, AFL s/p ablation, AF, hypothyroidism. She had a loop recorder implanted by Dr Johney Frame. Her ILR reached RRT April 2024. She has requested her ILR be replaced.   On Xarelto for stroke ppx.      Past medical, surgical, social and family history were reviewed.  ROS:   Please see the history of present illness.    All other systems reviewed and are negative.  EKGs/Labs/Other Studies Reviewed:    The following studies were reviewed today:     Physical Exam:    VS:  There were no vitals taken for this visit.    Wt Readings from Last 3 Encounters:  12/29/22 151 lb 0.4 oz (68.5 kg)  12/12/22 150 lb (68 kg)  10/28/22 147 lb (66.7 kg)     GEN: *** Well nourished, well developed in no acute distress CARDIAC: ***RRR, no murmurs, rubs, gallops RESPIRATORY:  Clear to auscultation without rales, wheezing or rhonchi       ASSESSMENT:    No diagnosis found. PLAN:    In order of problems listed above:   #AF #Hx of AFL On xarelto for stroke ppx. She has an ILR that has reached RRT. ILR being used for AF management. Patient would like it replaced to help guide therapy for her AF/AFL which I think is reasonable. I have discussed the ILR explant/reimplant procedure in detail and she wishes to proceed.  #Hypertension *** goal today.  Recommend checking blood pressures 1-2 times per week at home and recording the values.  Recommend bringing these recordings to the primary care physician.       Signed, Steffanie Dunn, MD, [facility], [facility] 12/29/2022 9:45  PM    Electrophysiology [facility]    ---------------------  SURGEON:  Steffanie Dunn, MD    PREPROCEDURE DIAGNOSIS:  Atrial fibrillation     POSTPROCEDURE DIAGNOSIS:  Atrial fibrillation      PROCEDURES:   1. Implantable loop recorder explantation  2. Implantable loop recorder implantation    INTRODUCTION:  Katie [name] is a 89 y.o. female with a history of AF and AFL who presents today for implantable loop explantation and reimplantation.  Her current ILR has reached end of service time.  The patient therefore presents today for implantable loop explantation and reimplantation.     DESCRIPTION OF PROCEDURE:  Informed written consent was obtained.  The patient required no sedation for the procedure today.   The patients left chest was therefore prepped and draped in the usual sterile fashion.  The skin overlying the ILR monitor was infiltrated with lidocaine for local analgesia.  A 0.5-cm incision was made over the site.  The previously implanted ILR was exposed and removed using a combination of sharp and blunt dissection.  A new ILR was implanted in a similar position (serial # ***)Steri- Strips and a sterile dressing were then applied. EBL<81ml.  There were no early apparent complications.     CONCLUSIONS:   1. Successful explantation of an implantable loop recorder  2. Successful implantation of an implantable loop recorder.  3. No early apparent complications.        Steffanie Dunn, MD 12/29/2022 9:48 PM

## 2022-12-29 NOTE — Addendum Note (Signed)
Addended by: Bennie Dallas C on: 12/29/2022 12:42 PM   Modules accepted: Orders

## 2022-12-29 NOTE — Progress Notes (Signed)
   Nurse Visit   Date of Encounter: 12/29/2022 ID: Katie [name], DOB 27-Nov-1931, MRN [id number]  PCP:  Bennie Pierini, FNP   [facility] Providers Cardiologist:  Verne Carrow, MD    Former Dr. Hillis Range Pt;  Francis Dowse, PA-C 12/12/2022.  Visit Details   VS:  BP (!) 156/78 (BP Location: Left Arm)   Pulse 72   Resp 20   Wt 151 lb 0.4 oz (68.5 kg)   SpO2 97%   BMI 24.38 kg/m  , BMI Body mass index is 24.38 kg/m.  Wt Readings from Last 3 Encounters:  12/29/22 151 lb 0.4 oz (68.5 kg)  12/12/22 150 lb (68 kg)  10/28/22 147 lb (66.7 kg)     Reason for visit: BP Check / RN Visit for EKG Performed today:  Wt, Vitals, EKG, Provider consulted:DOD, Dr. Carmina Miller, and Education Changes (medications, testing, etc.) : New start Hydralazine 10 mg BID;  Metoprolol 25 mg, Discontinued on 12/12/2022;   Length of Visit: 30 minutes    Medications Adjustments/Labs and Tests Ordered: Pt came into [facility] on 12/29/2022 at 1100 am with her son for a BP check, and EKG / RN Visit ordered by Ms. Francis Dowse PA-C on 12/12/2022.  On 4/26, Pt started taking Hydralazine 10 mg, BID, and STOPPED taking Metoprolol 25 mg.  Pt WT was assessed, and was 151 lbs 0.4 Oz.  Pt BP was 152/74 left arm, and 156/78 right arm.  Pt EKG was done in  Pod A, Room 2;   EKG read: Sinus rhythm with 2nd degree AV block Mobitz 1, Abnormal EKG;  Vent Rate- 58 bpm, PR interval * ms, QRS duration 94 ms, QT/Qtc 420 / 412 ms, P-R-T axes 77, -23, 81.  EKG taken to DOD, Dr. Carmina Miller;  Dr. Lynnette Caffey went to [facility] A Room 2, to discuss NEW 2:1 Heart block;  Pt stated no symptoms at time of visit with MD and RN;   Dr. Lynnette Caffey ordered EP follow up this week with [facility] provider to assess the new 2:1 Heart block.  A message was sent to [facility] EP scheduling to reach out to Pt son Harvie Heck, at [phone] to schedule this appointment.  No longer EP scheduling available at [address] office.  Message to [facility] EP  scheduling requested follow up, message once scheduled.      Signed, Elmore Guise, RN  12/29/2022 12:17 PM

## 2022-12-30 ENCOUNTER — Encounter: Payer: Self-pay | Admitting: Cardiology

## 2022-12-30 ENCOUNTER — Ambulatory Visit: Payer: Medicare Other | Attending: Cardiology | Admitting: Cardiology

## 2022-12-30 VITALS — BP 166/82 | HR 88 | Ht 66.0 in | Wt 151.8 lb

## 2022-12-30 DIAGNOSIS — I4819 Other persistent atrial fibrillation: Secondary | ICD-10-CM | POA: Diagnosis not present

## 2022-12-30 DIAGNOSIS — I10 Essential (primary) hypertension: Secondary | ICD-10-CM | POA: Diagnosis not present

## 2022-12-30 NOTE — Progress Notes (Signed)
Electrophysiology Office Follow up Visit Note:    Date:  12/30/2022   ID:  Katie [name], DOB [birth year], MRN [id number]  PCP:  Bennie Pierini, FNP  [facility] Cardiologist:  Verne Carrow, MD  [facility] Electrophysiologist:  Lanier Prude, MD    Interval History:    Katie [name] is a 89 y.o. female who presents for a follow up visit.   Last seen 12/12/2022 by Luster Landsberg.  She has a hx of HLD, DM, CKD, AFL s/p ablation, AF, hypothyroidism. She had a loop recorder implanted by Dr Johney Frame. Her ILR reached RRT April 2024. She has requested her ILR be replaced.   On Xarelto for stroke ppx.   Today, she is accompanied by a family member. Yesterday morning she hadn't felt well, although she also doesn't like to go to clinic visits.   She confirms that her blood pressure has been uncontrolled for quite some time. In clinic today her blood pressure is 166/82. At home she is seeing average readings in the 150s systolic, and once in a while as low as the 130s. She reports that metoprolol was started a few weeks ago, and then it was discontinued as of 12/12/22. She had subsequently started hydralazine 10 mg BID.   After discussion, she states that she would prefer not to undergo reimplantation of her loop recorder if it isn't necessary.  She denies any palpitations, chest pain, shortness of breath, or peripheral edema. No headaches, orthopnea, or PND.     Past medical, surgical, social and family history were reviewed.  ROS:   Please see the history of present illness.    (+) Dizziness All other systems reviewed and are negative.  EKGs/Labs/Other Studies Reviewed:    The following studies were reviewed today:     Physical Exam:    VS:  BP (!) 166/82   Pulse 88   Ht 5\' 6"  (1.676 m)   Wt 151 lb 12.8 oz (68.9 kg)   SpO2 95%   BMI 24.50 kg/m     Wt Readings from Last 3 Encounters:  12/30/22 151 lb 12.8 oz (68.9 kg)  12/29/22 151 lb 0.4 oz (68.5 kg)   12/12/22 150 lb (68 kg)     GEN:  Well nourished, well developed in no acute distress CARDIAC: RRR, no murmurs, rubs, gallops RESPIRATORY:  Clear to auscultation without rales, wheezing or rhonchi       ASSESSMENT:    1. Persistent atrial fibrillation (HCC)   2. Primary hypertension    PLAN:    In order of problems listed above:  #Wenckebach #First-degree AV delay No syncope or presyncope.  Off beta-blockers.  I would recommend avoiding any nodal blockers for Ms. Deroche at this time.  No current indication for permanent pacing.  #AF #Hx of AFL On xarelto for stroke ppx. She has an ILR that has reached RRT. ILR being used for AF management. No plans for reimplant at this time.  #Hypertension Above goal today.  She has been following with her primary care physician.  Her average blood pressure seems to be around 140 which I think is acceptable.  I do not want her to start having low blood pressures that are symptomatic and risk of falling.  Recommend checking blood pressures 1-2 times per week at home and recording the values.  Recommend bringing these recordings to the primary care physician.  Follow-up in 6 months with APP.  I,Mathew Stumpf,acting as a Neurosurgeon for Lanier Prude, MD.,have documented  all relevant documentation on the behalf of Lanier Prude, MD,as directed by  Lanier Prude, MD while in the presence of Lanier Prude, MD.  I, Lanier Prude, MD, have reviewed all documentation for this visit. The documentation on 12/30/22 for the exam, diagnosis, procedures, and orders are all accurate and complete.   Signed, Steffanie Dunn, MD, [facility], [facility] 12/30/2022 10:47 AM    Electrophysiology [facility]

## 2022-12-30 NOTE — Patient Instructions (Signed)
Medication Instructions:  Your physician recommends that you continue on your current medications as directed. Please refer to the Current Medication list given to you today.  *If you need a refill on your cardiac medications before your next appointment, please call your pharmacy*  Follow-Up: At [facility], you and your health needs are our priority.  As part of our continuing mission to provide you with exceptional heart care, we have created designated Provider Care Teams.  These Care Teams include your primary Cardiologist (physician) and Advanced Practice Providers (APPs -  Physician Assistants and Nurse Practitioners) who all work together to provide you with the care you need, when you need it.  Your next appointment:   6 month(s)  Provider:   You will see one of the following Advanced Practice Providers on your designated Care Team:   Renee Ursuy, PA-C Michael "Andy" Tillery, PA-C Suzann Riddle, NP   

## 2023-01-05 ENCOUNTER — Encounter: Payer: Self-pay | Admitting: Physical Medicine and Rehabilitation

## 2023-01-05 ENCOUNTER — Other Ambulatory Visit: Payer: Self-pay

## 2023-01-05 ENCOUNTER — Ambulatory Visit: Payer: Medicare Other | Admitting: Physical Medicine and Rehabilitation

## 2023-01-05 ENCOUNTER — Ambulatory Visit: Payer: Medicare Other | Admitting: Nurse Practitioner

## 2023-01-05 DIAGNOSIS — M47816 Spondylosis without myelopathy or radiculopathy, lumbar region: Secondary | ICD-10-CM | POA: Diagnosis not present

## 2023-01-05 DIAGNOSIS — M48062 Spinal stenosis, lumbar region with neurogenic claudication: Secondary | ICD-10-CM | POA: Diagnosis not present

## 2023-01-05 DIAGNOSIS — M7062 Trochanteric bursitis, left hip: Secondary | ICD-10-CM | POA: Diagnosis not present

## 2023-01-05 DIAGNOSIS — M5416 Radiculopathy, lumbar region: Secondary | ICD-10-CM

## 2023-01-05 DIAGNOSIS — M25552 Pain in left hip: Secondary | ICD-10-CM | POA: Diagnosis not present

## 2023-01-05 MED ORDER — METHYLPREDNISOLONE ACETATE 80 MG/ML IJ SUSP: 80.0000 mg | Freq: Once | INTRAMUSCULAR | Status: DC

## 2023-01-05 NOTE — Patient Instructions (Signed)

## 2023-01-05 NOTE — Progress Notes (Signed)
Functional Pain Scale - descriptive words and definitions  Mild (2)   Noticeable when not distracted/no impact on ADL's/sleep only slightly affected and able to   use both passive and active distraction for comfort. Mild range order  Average Pain 5   +Driver, -BT, -Dye Allergies.  Tylenol helps with pain  Inj did help last July  She does have a driver today

## 2023-01-05 NOTE — Progress Notes (Signed)
Katie [name] - 89 y.o. female MRN [id number]  Date of birth: [birth year]  Office Visit Note: Visit Date: 01/05/2023 PCP: Bennie Pierini, FNP Referred by: Daphine Deutscher, Mary-Margaret, *  Subjective: Chief Complaint  Patient presents with   Lower Back - Injections   HPI: Katie [name] is a 89 y.o. female who comes in today for evaluation and management at the request of Dr. Annell Greening for low back right more than left hip and leg pain with some feeling of weakness.  Her case is complicated by type 2 diabetes and congestive heart failure and she is on Xarelto anticoagulation.  She has MRI from a few years ago with multilevel stenosis most severe at L3-4 moderate severe at L4-5 with facet arthropathy.  History is trochanteric type pain with some pain over the trochanter and some relief with epidural injection.  Typically followed by Dr. Prince Solian.  She comes in today with having had an injection by's which was a left L3 transforaminal injection performed in July of last year.  She has had worsening symptoms over the last several months without new injury or fall.  No hip or groin pain but some lateral hip pain.  Across the lower back worse with standing and ambulating.  She does use a rolling walker for ambulation.  Uses Tylenol some for pain but has not really had much relief lately with medications.   I spent more than 30 minutes speaking face-to-face with the patient with 50% of the time in counseling and discussing coordination of care.      Review of Systems  Musculoskeletal:  Positive for back pain and joint pain.  Neurological:  Positive for tingling and weakness.  All other systems reviewed and are negative.  Otherwise per HPI.  Assessment & Plan: Visit Diagnoses:    ICD-10-CM   1. Lumbar radiculopathy  M54.16 XR C-ARM NO REPORT    Epidural Steroid injection    methylPREDNISolone acetate (DEPO-MEDROL) injection 80 mg    2. Spinal stenosis of lumbar region with neurogenic  claudication  M48.062     3. Spondylosis without myelopathy or radiculopathy, lumbar region  M47.816     4. Pain in left hip  M25.552     5. Greater trochanteric bursitis, left  M70.62        Plan: Findings:  Chronic worsening low back and radicular type hip pain left much more than right.  Some pain over the greater trochanter.  No pain with hip rotation.  She has had history of hip fracture.  Somewhat confusing with diabetes but no history of peripheral polyneuropathy.  Decided today to perform diagnostic and likely therapeutic left L3 transforaminal injection.  Depending on relief we will follow-up with Dr. Ophelia Charter or [place].  Consider different level injection versus more directed injection if it is more bursitis.    Meds & Orders:  Meds ordered this encounter  Medications   methylPREDNISolone acetate (DEPO-MEDROL) injection 80 mg    Orders Placed This Encounter  Procedures   XR C-ARM NO REPORT   Epidural Steroid injection    Follow-up: Return if symptoms worsen or fail to improve.   Procedures: No procedures performed  Lumbosacral Transforaminal Epidural Steroid Injection - Sub-Pedicular Approach with Fluoroscopic Guidance  Patient: Katie [name]      Date of Birth: [birth year] MRN: [id number] PCP: Bennie Pierini, FNP      Visit Date: 01/05/2023   Universal Protocol:    Date/Time: 01/05/2023  Consent Given By: the patient  Position: PRONE  Additional Comments: Vital signs were monitored before and after the procedure. Patient was prepped and draped in the usual sterile fashion. The correct patient, procedure, and site was verified.   Injection Procedure Details:   Procedure diagnoses: Lumbar radiculopathy [M54.16]    Meds Administered:  Meds ordered this encounter  Medications   methylPREDNISolone acetate (DEPO-MEDROL) injection 80 mg    Laterality: Left  Location/Site: L3  Needle:5.0 in., 22 ga.  Short bevel or Quincke spinal needle  Needle  Placement: Transforaminal  Findings:    -Comments: Excellent flow of contrast along the nerve, nerve root and into the epidural space.  Procedure Details: After squaring off the end-plates to get a true AP view, the C-arm was positioned so that an oblique view of the foramen as noted above was visualized. The target area is just inferior to the "nose of the scotty dog" or sub pedicular. The soft tissues overlying this structure were infiltrated with 2-3 ml. of 1% Lidocaine without Epinephrine.  The spinal needle was inserted toward the target using a "trajectory" view along the fluoroscope beam.  Under AP and lateral visualization, the needle was advanced so it did not puncture dura and was located close the 6 O'Clock position of the pedical in AP tracterory. Biplanar projections were used to confirm position. Aspiration was confirmed to be negative for CSF and/or blood. A 1-2 ml. volume of Isovue-250 was injected and flow of contrast was noted at each level. Radiographs were obtained for documentation purposes.   After attaining the desired flow of contrast documented above, a 0.5 to 1.0 ml test dose of 0.25% Marcaine was injected into each respective transforaminal space.  The patient was observed for 90 seconds post injection.  After no sensory deficits were reported, and normal lower extremity motor function was noted,   the above injectate was administered so that equal amounts of the injectate were placed at each foramen (level) into the transforaminal epidural space.   Additional Comments:  The patient tolerated the procedure well Dressing: 2 x 2 sterile gauze and Band-Aid    Post-procedure details: Patient was observed during the procedure. Post-procedure instructions were reviewed.  Patient left the clinic in stable condition.    Clinical History: MRI LUMBAR SPINE WITHOUT CONTRAST   TECHNIQUE: Multiplanar, multisequence MR imaging of the lumbar spine was performed. No  intravenous contrast was administered.   COMPARISON:  CT lumbar spine 04/13/2015.   FINDINGS: Segmentation:  Standard.   Alignment:  Convex right scoliosis noted.   Vertebrae: No acute abnormality or worrisome marrow lesion. Multiple Schmorl's nodes are seen. Mild, remote superior endplate compression fracture of T12 is unchanged.   Conus medullaris and cauda equina: Conus extends to the L1-2 level. Conus and cauda equina appear normal.   Paraspinal and other soft tissues: Right renal cyst is identified.   Disc levels:   T11-12 is imaged in the sagittal plane only. There is a minimal bulge without stenosis.   T12-L1: Mild-to-moderate facet degenerative change. Otherwise negative.   L1-2: Shallow disc bulge to the right and mild ligamentum flavum thickening. Mild facet arthropathy. No stenosis.   L2-3: Loss of disc space height with a shallow bulge and superimposed left subarticular recess and foraminal protrusion. There is mild to moderate central canal stenosis. Moderate narrowing in the left subarticular recess and foramen is also identified. The right foramen is open.   L3-4: Ligamentum flavum thickening and a broad-based disc bulge cause moderately severe central canal stenosis and right worse  than left subarticular recess narrowing. Mild to moderate foraminal narrowing is worse on the right.   L4-5: Bulky ligamentum flavum thickening, broad-based disc bulge and facet degenerative disease worse on the right where there is marrow edema in the right pedicles consistent with secondary stress change. There is moderately severe to severe central canal stenosis and marked narrowing in the right subarticular recess and foramen. Mild to moderate left foraminal narrowing is present.   L5-S1: Shallow disc bulge with facet degenerative disease. No stenosis.   IMPRESSION: No acute abnormality.  Remote T12 compression fracture is unchanged.   Spondylosis worst at L4-5  where there is moderately severe to severe central canal stenosis and marked narrowing in the right subarticular recess and foramen with encroachment on the exiting right L4 and descending right L5 roots.   Moderately severe central canal stenosis and right worse than left subarticular recess narrowing at L3-4.   Moderate narrowing in the left subarticular recess and foramen at L2-3 where there is mild to moderate central canal stenosis overall.     Electronically Signed   By: Drusilla Kanner M.D.   On: 05/18/2019 09:28   She reports that she has never smoked. She has never used smokeless tobacco.  Recent Labs    04/04/22 1032 10/06/22 1048  HGBA1C 7.3* 7.7*    Objective:  VS:  HT:    WT:   BMI:     BP:   HR: bpm  TEMP: ( )  RESP:  Physical Exam Vitals and nursing note reviewed.  Constitutional:      General: She is not in acute distress.    Appearance: Normal appearance. She is well-developed. She is not ill-appearing.  HENT:     Head: Normocephalic and atraumatic.     Right Ear: External ear normal.     Left Ear: External ear normal.  Eyes:     Extraocular Movements: Extraocular movements intact.     Conjunctiva/sclera: Conjunctivae normal.     Pupils: Pupils are equal, round, and reactive to light.  Cardiovascular:     Rate and Rhythm: Normal rate.     Pulses: Normal pulses.  Pulmonary:     Effort: Pulmonary effort is normal. No respiratory distress.  Abdominal:     General: There is no distension.     Palpations: Abdomen is soft.  Musculoskeletal:        General: Tenderness present.     Cervical back: Neck supple.     Right lower leg: No edema.     Left lower leg: No edema.     Comments: Patient has good distal strength with no pain over the greater trochanters.  No clonus or focal weakness.  Skin:    General: Skin is warm and dry.     Findings: No erythema, lesion or rash.  Neurological:     General: No focal deficit present.     Mental Status: She  is alert and oriented to person, place, and time.     Cranial Nerves: No cranial nerve deficit.     Sensory: No sensory deficit.     Motor: No weakness or abnormal muscle tone.     Coordination: Coordination normal.     Gait: Gait abnormal.  Psychiatric:        Mood and Affect: Mood normal.        Behavior: Behavior normal.     Ortho Exam  Imaging: No results found.  Past Medical/Family/Surgical/Social History: Medications & Allergies reviewed per EMR, new medications  updated. Patient Active Problem List   Diagnosis Date Noted   Breakage of internal fixation device in bone (HCC) 11/22/2019   Displaced intertrochanteric fracture of left femur, initial encounter for closed fracture (HCC) 09/22/2019   Hypomagnesemia 08/12/2019   Regurgitation of food 07/20/2019   Chronic midline low back pain without sciatica 10/22/2018   Primary insomnia 10/22/2018   Persistent atrial fibrillation (HCC) 04/27/2017   Chronic diastolic CHF (congestive heart failure) (HCC) 04/27/2017   CKD (chronic kidney disease) stage 3, GFR 30-59 ml/min (HCC) 05/14/2015   BMI 26.0-26.9,adult 05/11/2015   Peripheral edema 07/06/2014   Hypokalemia 07/22/2013   Hypothyroidism 07/22/2013   Osteopenia, senile 03/09/2013   Constipation 01/04/2013   Hypertension 05/07/2012   Hyperlipidemia 05/07/2012   Diabetes mellitus type 2, diet-controlled (HCC) 05/07/2012   GERD (gastroesophageal reflux disease) 05/07/2012   Past Medical History:  Diagnosis Date   Anxiety    Aortic insufficiency    a. Trivial AI by echo 02/2016.   Atrial fibrillation and flutter (HCC)    a. Coarse afib vs flutter by EKG 12/2015.   Cancer (HCC)    skin cancer   Cataract    Chronic diastolic CHF (congestive heart failure) (HCC)    CKD (chronic kidney disease), stage III (HCC)    COVID-19    GERD (gastroesophageal reflux disease)    Hiatal hernia    Hypercholesterolemia    Hypertension    Hypokalemia    Hypothyroidism    Left knee  pain    NIDDM (non-insulin dependent diabetes mellitus)    diet controlled    Osteoporosis    Premature atrial contractions    PVC's (premature ventricular contractions)    Vitamin D deficiency    Family History  Problem Relation Age of Onset   Diabetes Mother    Stroke Mother    Heart disease Father    Stroke Father    Uterine cancer Sister    Diabetes Sister    Ovarian cancer Sister    Colon cancer Sister    Diabetes Sister    Liver cancer Sister        \   Atrial fibrillation Sister    Diabetes Sister    Diabetes Brother    Dementia Brother    Diabetes Son    Healthy Son    Heart attack Neg Hx    Hypertension Neg Hx    Esophageal cancer Neg Hx    Rectal cancer Neg Hx    Stomach cancer Neg Hx    Breast cancer Neg Hx    Past Surgical History:  Procedure Laterality Date   A-FLUTTER ABLATION N/A 02/08/2019   Procedure: A-FLUTTER ABLATION;  Surgeon: Hillis Range, MD;  Location: [facility];  Service: Cardiovascular;  Laterality: N/A;   ABDOMINAL HYSTERECTOMY     APPENDECTOMY  1980   BACK SURGERY     CATARACT EXTRACTION, BILATERAL     CHOLECYSTECTOMY  5/00   COLONOSCOPY     implantable loop recorder placement  04/06/2019   MDT Reveal LINQ [id number] ([id number]) implanted for evaluation of palpitations and afib post atrial flutter ablation by Dr Johney Frame in office   INTRAMEDULLARY (IM) NAIL INTERTROCHANTERIC Left 09/23/2019   Procedure: INTRAMEDULLARY (IM) NAIL INTERTROCHANTRIC;  Surgeon: Roby Lofts, MD;  Location: [facility];  Service: Orthopedics;  Laterality: Left;   TONSILLECTOMY     TOTAL ABDOMINAL HYSTERECTOMY W/ BILATERAL SALPINGOOPHORECTOMY  1980   UPPER GASTROINTESTINAL ENDOSCOPY     Social History   Occupational History  Occupation: Retired    Comment: Ambulance person , golf, farm   Tobacco Use   Smoking status: Never   Smokeless tobacco: Never  Vaping Use   Vaping Use: Never used  Substance and Sexual Activity   Alcohol use: No   Drug  use: No   Sexual activity: Not Currently

## 2023-01-05 NOTE — Procedures (Signed)
Lumbosacral Transforaminal Epidural Steroid Injection - Sub-Pedicular Approach with Fluoroscopic Guidance  Patient: Katie [name]      Date of Birth: April 06, 1932 MRN: [id number] PCP: Bennie Pierini, FNP      Visit Date: 01/05/2023   Universal Protocol:    Date/Time: 01/05/2023  Consent Given By: the patient  Position: PRONE  Additional Comments: Vital signs were monitored before and after the procedure. Patient was prepped and draped in the usual sterile fashion. The correct patient, procedure, and site was verified.   Injection Procedure Details:   Procedure diagnoses: Lumbar radiculopathy [M54.16]    Meds Administered:  Meds ordered this encounter  Medications   methylPREDNISolone acetate (DEPO-MEDROL) injection 80 mg    Laterality: Left  Location/Site: L3  Needle:5.0 in., 22 ga.  Short bevel or Quincke spinal needle  Needle Placement: Transforaminal  Findings:    -Comments: Excellent flow of contrast along the nerve, nerve root and into the epidural space.  Procedure Details: After squaring off the end-plates to get a true AP view, the C-arm was positioned so that an oblique view of the foramen as noted above was visualized. The target area is just inferior to the "nose of the scotty dog" or sub pedicular. The soft tissues overlying this structure were infiltrated with 2-3 ml. of 1% Lidocaine without Epinephrine.  The spinal needle was inserted toward the target using a "trajectory" view along the fluoroscope beam.  Under AP and lateral visualization, the needle was advanced so it did not puncture dura and was located close the 6 O'Clock position of the pedical in AP tracterory. Biplanar projections were used to confirm position. Aspiration was confirmed to be negative for CSF and/or blood. A 1-2 ml. volume of Isovue-250 was injected and flow of contrast was noted at each level. Radiographs were obtained for documentation purposes.   After attaining the desired  flow of contrast documented above, a 0.5 to 1.0 ml test dose of 0.25% Marcaine was injected into each respective transforaminal space.  The patient was observed for 90 seconds post injection.  After no sensory deficits were reported, and normal lower extremity motor function was noted,   the above injectate was administered so that equal amounts of the injectate were placed at each foramen (level) into the transforaminal epidural space.   Additional Comments:  The patient tolerated the procedure well Dressing: 2 x 2 sterile gauze and Band-Aid    Post-procedure details: Patient was observed during the procedure. Post-procedure instructions were reviewed.  Patient left the clinic in stable condition.

## 2023-01-06 ENCOUNTER — Encounter: Payer: Self-pay | Admitting: Nurse Practitioner

## 2023-01-06 ENCOUNTER — Ambulatory Visit (INDEPENDENT_AMBULATORY_CARE_PROVIDER_SITE_OTHER): Payer: Medicare Other | Admitting: Nurse Practitioner

## 2023-01-06 ENCOUNTER — Other Ambulatory Visit: Payer: Self-pay | Admitting: Nurse Practitioner

## 2023-01-06 VITALS — BP 112/58 | HR 66 | Temp 98.1°F | Resp 20 | Ht 66.0 in | Wt 151.0 lb

## 2023-01-06 DIAGNOSIS — N1832 Chronic kidney disease, stage 3b: Secondary | ICD-10-CM

## 2023-01-06 DIAGNOSIS — K219 Gastro-esophageal reflux disease without esophagitis: Secondary | ICD-10-CM

## 2023-01-06 DIAGNOSIS — I5032 Chronic diastolic (congestive) heart failure: Secondary | ICD-10-CM | POA: Diagnosis not present

## 2023-01-06 DIAGNOSIS — M8580 Other specified disorders of bone density and structure, unspecified site: Secondary | ICD-10-CM

## 2023-01-06 DIAGNOSIS — E039 Hypothyroidism, unspecified: Secondary | ICD-10-CM

## 2023-01-06 DIAGNOSIS — I130 Hypertensive heart and chronic kidney disease with heart failure and stage 1 through stage 4 chronic kidney disease, or unspecified chronic kidney disease: Secondary | ICD-10-CM | POA: Diagnosis not present

## 2023-01-06 DIAGNOSIS — E119 Type 2 diabetes mellitus without complications: Secondary | ICD-10-CM | POA: Diagnosis not present

## 2023-01-06 DIAGNOSIS — E782 Mixed hyperlipidemia: Secondary | ICD-10-CM | POA: Diagnosis not present

## 2023-01-06 DIAGNOSIS — R600 Localized edema: Secondary | ICD-10-CM

## 2023-01-06 DIAGNOSIS — E876 Hypokalemia: Secondary | ICD-10-CM | POA: Diagnosis not present

## 2023-01-06 DIAGNOSIS — I4819 Other persistent atrial fibrillation: Secondary | ICD-10-CM

## 2023-01-06 DIAGNOSIS — I10 Essential (primary) hypertension: Secondary | ICD-10-CM

## 2023-01-06 DIAGNOSIS — M545 Low back pain, unspecified: Secondary | ICD-10-CM

## 2023-01-06 DIAGNOSIS — F5101 Primary insomnia: Secondary | ICD-10-CM

## 2023-01-06 DIAGNOSIS — Z6826 Body mass index (BMI) 26.0-26.9, adult: Secondary | ICD-10-CM

## 2023-01-06 LAB — BAYER DCA HB A1C WAIVED: HB A1C (BAYER DCA - WAIVED): 6.7 % — ABNORMAL HIGH (ref 4.8–5.6)

## 2023-01-06 MED ORDER — RALOXIFENE HCL 60 MG PO TABS
60.0000 mg | ORAL_TABLET | Freq: Every day | ORAL | 1 refills | Status: DC
Start: 2023-01-06 — End: 2023-11-17

## 2023-01-06 MED ORDER — PANTOPRAZOLE SODIUM 40 MG PO TBEC
40.0000 mg | DELAYED_RELEASE_TABLET | Freq: Every day | ORAL | 1 refills | Status: AC
Start: 2023-01-06 — End: ?

## 2023-01-06 MED ORDER — BENAZEPRIL HCL 40 MG PO TABS
40.0000 mg | ORAL_TABLET | Freq: Every day | ORAL | 1 refills | Status: AC
Start: 2023-01-06 — End: ?

## 2023-01-06 MED ORDER — LOVASTATIN 40 MG PO TABS
40.0000 mg | ORAL_TABLET | Freq: Every day | ORAL | 0 refills | Status: AC
Start: 2023-01-06 — End: ?

## 2023-01-06 MED ORDER — LEVOTHYROXINE SODIUM 75 MCG PO TABS
75.0000 ug | ORAL_TABLET | Freq: Every day | ORAL | 1 refills | Status: AC
Start: 2023-01-06 — End: ?

## 2023-01-06 MED ORDER — POTASSIUM CHLORIDE CRYS ER 20 MEQ PO TBCR
20.0000 meq | EXTENDED_RELEASE_TABLET | Freq: Every day | ORAL | 1 refills | Status: AC
Start: 2023-01-06 — End: ?

## 2023-01-06 MED ORDER — FAMOTIDINE 20 MG PO TABS
20.0000 mg | ORAL_TABLET | Freq: Every day | ORAL | 1 refills | Status: AC
Start: 2023-01-06 — End: ?

## 2023-01-06 MED ORDER — GI COCKTAIL ~~LOC~~
30.0000 mL | Freq: Every day | ORAL | 2 refills | Status: DC
Start: 2023-01-06 — End: 2023-11-17

## 2023-01-06 MED ORDER — FUROSEMIDE 40 MG PO TABS: 40.0000 mg | ORAL_TABLET | Freq: Every day | ORAL | 1 refills | Status: DC

## 2023-01-06 MED ORDER — RIVAROXABAN 15 MG PO TABS: 15.0000 mg | ORAL_TABLET | Freq: Every day | ORAL | 1 refills | Status: DC

## 2023-01-06 NOTE — Progress Notes (Signed)
Subjective:    Patient ID: Katie [name], female    DOB: Apr 05, 1932, 89 y.o.   MRN: [id number]   Chief Complaint: Medical Management of Chronic Issues    HPI:  Katie [name] is a 89 y.o. who identifies as a female who was assigned female at birth.   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 Primary hypertension No c/o chest pain, sob or headache. She does check her blood pressures at home and they run in 150's systolic on average. BP Readings from Last 3 Encounters:  01/06/23 (!) 112/58  12/30/22 (!) 166/82  12/29/22 (!) 156/78     2. Mixed hyperlipidemia Does not really watch diet. Does no exercise, but does still do her own house work. Lab Results  Component Value Date   CHOL 141 10/06/2022   HDL 65 10/06/2022   LDLCALC 53 10/06/2022   TRIG 134 10/06/2022   CHOLHDL 2.2 10/06/2022     3. Diabetes mellitus type 2, diet-controlled (HCC) Doe snot check her blood sugars at home. Lab Results  Component Value Date   HGBA1C 7.7 (H) 10/06/2022     4. Chronic diastolic CHF (congestive heart failure) (HCC) 5. Persistent atrial fibrillation (HCC) Patein saw cardiology on 12/30/22. She has a loop recorder in and they said she is doing well and does not need a pacemaker.  6. Gastroesophageal reflux disease without esophagitis Is on protonix and takes gi cocktail daily  7. Acquired hypothyroidism No issues that she is aware of. Lab Results  Component Value Date   TSH 3.080 10/06/2022     8. Stage 3b chronic kidney disease (HCC) No issues Lab Results  Component Value Date   CREATININE 1.12 (H) 12/12/2022     9. Hypokalemia No muscle cram,ps Lab Results  Component Value Date   K 4.4 12/12/2022     10. Hypomagnesemia Last magnesium level was 2.1  11. Peripheral edema Has some edema at the end of each day  12. Primary insomnia No issues  sleeping  13. Chronic midline low back pain without sciatica Pain assessment: Cause of pain- DDD Pain location- lower back  Pain on scale of 1-10- 0/10- had back injection yesterday Frequency- daily What increases pain-nothing What makes pain Better-rest helps Effects on ADL - no Any change in general medical condition-no  Current opioids rx- nothing currently- has tramadol left over # meds rx- 0 Effectiveness of current meds-helps Adverse reactions from pain meds-none Morphine equivalent- 0  Pill count performed-No Last drug screen - 01/29/22 ( high risk q83m, moderate risk q41m, low risk yearly ) Urine drug screen today- No Was the [facility] reviewed- yes  If yes were their any concerning findings? - no   Overdose risk: 1   Pain contract signed on:   14. BMI 26.0-26.9,adult No recent weight changes Wt Readings from Last 3 Encounters:  01/06/23 151 lb (68.5 kg)  12/30/22 151 lb 12.8 oz (68.9 kg)  12/29/22 151 lb 0.4 oz (68.5 kg)   BMI Readings from Last 3 Encounters:  01/06/23 24.37 kg/m  12/30/22 24.50 kg/m  12/29/22 24.38 kg/m      New complaints: None today  Allergies  Allergen Reactions   Shellfish Allergy Other (See Comments)   Amlodipine Swelling   Macrodantin Nausea And Vomiting   Metformin And Related Nausea And Vomiting and Other (See Comments)    Bloating   Penicillins  Rash    Did it involve swelling of the face/tongue/throat, SOB, or low BP? Unknown Did it involve sudden or severe rash/hives, skin peeling, or any reaction on the inside of your mouth or nose? Yes Did you need to seek medical attention at a hospital or doctor's office? Yes When did it last happen? 2005  If all above answers are "NO", may proceed with cephalosporin use.    Outpatient Encounter Medications as of 01/06/2023  Medication Sig   Alum & Mag Hydroxide-Simeth (GI COCKTAIL) SUSP suspension Take 30 mLs by mouth at bedtime. Shake well.   benazepril (LOTENSIN) 40 MG tablet  Take 1 tablet (40 mg total) by mouth at bedtime.   Cholecalciferol (VITAMIN D-3) 125 MCG (5000 UT) TABS Take 5,000 Units by mouth daily.   cloNIDine (CATAPRES) 0.1 MG tablet Take 1 tablet (0.1 mg total) by mouth 2 (two) times daily.   famotidine (PEPCID) 20 MG tablet Take 1 tablet (20 mg total) by mouth at bedtime.   feeding supplement, ENSURE ENLIVE, (ENSURE ENLIVE) LIQD Take 237 mLs by mouth 2 (two) times daily between meals.   fluticasone (FLONASE) 50 MCG/ACT nasal spray USE 2 SPRAYS IN EACH NOSTRIL ONCE DAILY.   furosemide (LASIX) 40 MG tablet Take 1 tablet (40 mg total) by mouth daily.   hydrALAZINE (APRESOLINE) 10 MG tablet Take 1 tablet (10 mg total) by mouth in the morning and at bedtime.   levothyroxine (SYNTHROID) 75 MCG tablet Take 1 tablet (75 mcg total) by mouth daily.   lovastatin (MEVACOR) 40 MG tablet Take 1 tablet (40 mg total) by mouth at bedtime.   Multiple Vitamin (MULTIVITAMIN WITH MINERALS) TABS tablet Take 1 tablet by mouth daily.   pantoprazole (PROTONIX) 40 MG tablet Take 1 tablet (40 mg total) by mouth daily.   potassium chloride SA (KLOR-CON M) 20 MEQ tablet Take 1 tablet (20 mEq total) by mouth daily.   raloxifene (EVISTA) 60 MG tablet Take 1 tablet (60 mg total) by mouth daily.   Rivaroxaban (XARELTO) 15 MG TABS tablet Take 1 tablet (15 mg total) by mouth daily with supper.   traMADol (ULTRAM) 50 MG tablet Take 1 tablet (50 mg total) by mouth 3 (three) times daily as needed.   Facility-Administered Encounter Medications as of 01/06/2023  Medication   methylPREDNISolone acetate (DEPO-MEDROL) injection 80 mg    Past Surgical History:  Procedure Laterality Date   A-FLUTTER ABLATION N/A 02/08/2019   Procedure: A-FLUTTER ABLATION;  Surgeon: Hillis Range, MD;  Location: [facility];  Service: Cardiovascular;  Laterality: N/A;   ABDOMINAL HYSTERECTOMY     APPENDECTOMY  1980   BACK SURGERY     CATARACT EXTRACTION, BILATERAL     CHOLECYSTECTOMY  5/00    COLONOSCOPY     implantable loop recorder placement  04/06/2019   MDT Reveal LINQ [id number] ([id number]) implanted for evaluation of palpitations and afib post atrial flutter ablation by Dr Johney Frame in office   INTRAMEDULLARY (IM) NAIL INTERTROCHANTERIC Left 09/23/2019   Procedure: INTRAMEDULLARY (IM) NAIL INTERTROCHANTRIC;  Surgeon: Roby Lofts, MD;  Location: [facility];  Service: Orthopedics;  Laterality: Left;   TONSILLECTOMY     TOTAL ABDOMINAL HYSTERECTOMY W/ BILATERAL SALPINGOOPHORECTOMY  1980   UPPER GASTROINTESTINAL ENDOSCOPY      Family History  Problem Relation Age of Onset   Diabetes Mother    Stroke Mother    Heart disease Father    Stroke Father    Uterine cancer Sister    Diabetes Sister  Ovarian cancer Sister    Colon cancer Sister    Diabetes Sister    Liver cancer Sister        \   Atrial fibrillation Sister    Diabetes Sister    Diabetes Brother    Dementia Brother    Diabetes Son    Healthy Son    Heart attack Neg Hx    Hypertension Neg Hx    Esophageal cancer Neg Hx    Rectal cancer Neg Hx    Stomach cancer Neg Hx    Breast cancer Neg Hx       Controlled substance contract: 01/31/22     Review of Systems  Constitutional:  Negative for diaphoresis.  Eyes:  Negative for pain.  Respiratory:  Negative for shortness of breath.   Cardiovascular:  Negative for chest pain, palpitations and leg swelling.  Gastrointestinal:  Negative for abdominal pain.  Endocrine: Negative for polydipsia.  Skin:  Negative for rash.  Neurological:  Negative for dizziness, weakness and headaches.  Hematological:  Does not bruise/bleed easily.  All other systems reviewed and are negative.      Objective:   Physical Exam Vitals and nursing note reviewed.  Constitutional:      General: She is not in acute distress.    Appearance: Normal appearance. She is well-developed.  HENT:     Head: Normocephalic.     Right Ear: Tympanic membrane normal.     Left Ear: Tympanic  membrane normal.     Nose: Nose normal.     Mouth/Throat:     Mouth: Mucous membranes are moist.  Eyes:     Pupils: Pupils are equal, round, and reactive to light.  Neck:     Vascular: No carotid bruit or JVD.  Cardiovascular:     Rate and Rhythm: Normal rate. Rhythm irregular.     Heart sounds: Normal heart sounds.  Pulmonary:     Effort: Pulmonary effort is normal. No respiratory distress.     Breath sounds: Normal breath sounds. No wheezing or rales.  Chest:     Chest wall: No tenderness.  Abdominal:     General: Bowel sounds are normal. There is no distension or abdominal bruit.     Palpations: Abdomen is soft. There is no hepatomegaly, splenomegaly, mass or pulsatile mass.     Tenderness: There is no abdominal tenderness.  Musculoskeletal:        General: Normal range of motion.     Cervical back: Normal range of motion and neck supple.     Comments: Ambulating with walker  Lymphadenopathy:     Cervical: No cervical adenopathy.  Skin:    General: Skin is warm and dry.  Neurological:     Mental Status: She is alert and oriented to person, place, and time.     Deep Tendon Reflexes: Reflexes are normal and symmetric.  Psychiatric:        Behavior: Behavior normal.        Thought Content: Thought content normal.        Judgment: Judgment normal.    BP (!) 112/58   Pulse 66   Temp 98.1 F (36.7 C) (Temporal)   Resp 20   Ht 5\' 6"  (1.676 m)   Wt 151 lb (68.5 kg)   SpO2 96%   BMI 24.37 kg/m         Assessment & Plan:   Katie [name] comes in today with chief complaint of Medical Management of Chronic Issues  Diagnosis and orders addressed:  1. Primary hypertension Low sodium diet - CBC with Differential/Platelet - CMP14+EGFR - benazepril (LOTENSIN) 40 MG tablet; Take 1 tablet (40 mg total) by mouth at bedtime.  Dispense: 90 tablet; Refill: 1  2. Mixed hyperlipidemia Low fat diet - Lipid panel - lovastatin (MEVACOR) 40 MG tablet; Take 1 tablet (40 mg  total) by mouth at bedtime.  Dispense: 90 tablet; Refill: 0  3. Diabetes mellitus type 2, diet-controlled (HCC) Continue to watch carbs in diet - Bayer DCA Hb A1c Waived  4. Chronic diastolic CHF (congestive heart failure) (HCC) Keep follow up with cardiology  5. Persistent atrial fibrillation (HCC) - Rivaroxaban (XARELTO) 15 MG TABS tablet; Take 1 tablet (15 mg total) by mouth daily with supper.  Dispense: 90 tablet; Refill: 1  6. Gastroesophageal reflux disease without esophagitis Avoid spicy foods Do not eat 2 hours prior to bedtime  - Alum & Mag Hydroxide-Simeth (GI COCKTAIL) SUSP suspension; Take 30 mLs by mouth at bedtime. Shake well.  Dispense: 900 mL; Refill: 2 - famotidine (PEPCID) 20 MG tablet; Take 1 tablet (20 mg total) by mouth at bedtime.  Dispense: 90 tablet; Refill: 1 - pantoprazole (PROTONIX) 40 MG tablet; Take 1 tablet (40 mg total) by mouth daily.  Dispense: 90 tablet; Refill: 1  7. Acquired hypothyroidism Labs pending - levothyroxine (SYNTHROID) 75 MCG tablet; Take 1 tablet (75 mcg total) by mouth daily.  Dispense: 90 tablet; Refill: 1  8. Stage 3b chronic kidney disease (HCC) Labs pending  9. Hypokalemia Labs pending - potassium chloride SA (KLOR-CON M) 20 MEQ tablet; Take 1 tablet (20 mEq total) by mouth daily.  Dispense: 90 tablet; Refill: 1  10. Hypomagnesemia Labs pending  11. Peripheral edema Elevate  legs when sitting - furosemide (LASIX) 40 MG tablet; Take 1 tablet (40 mg total) by mouth daily.  Dispense: 90 tablet; Refill: 1  12. Primary insomnia Bedtime routine  13. Chronic midline low back pain without sciatica Moist heat Follow  up with specialist for injections  14. BMI 26.0-26.9,adult Discussed diet and exercise for person with BMI >25 Will recheck weight in 3-6 months   15. Osteopenia, senile No more dexascna to be done - raloxifene (EVISTA) 60 MG tablet; Take 1 tablet (60 mg total) by mouth daily.  Dispense: 90 tablet; Refill:  1   Labs pending Health Maintenance reviewed Diet and exercise encouraged  Follow up plan: 6 months   Mary-Margaret Daphine Deutscher, FNP

## 2023-01-06 NOTE — Patient Instructions (Signed)
Fall Prevention in the Home, Adult Falls can cause injuries and can happen to people of all ages. There are many things you can do to make your home safer and to help prevent falls. What actions can I take to prevent falls? General information Use good lighting in all rooms. Make sure to: Replace any light bulbs that burn out. Turn on the lights in dark areas and use night-lights. Keep items that you use often in easy-to-reach places. Lower the shelves around your home if needed. Move furniture so that there are clear paths around it. Do not use throw rugs or other things on the floor that can make you trip. If any of your floors are uneven, fix them. Add color or contrast paint or tape to clearly mark and help you see: Grab bars or handrails. First and last steps of staircases. Where the edge of each step is. If you use a ladder or stepladder: Make sure that it is fully opened. Do not climb a closed ladder. Make sure the sides of the ladder are locked in place. Have someone hold the ladder while you use it. Know where your pets are as you move through your home. What can I do in the bathroom?     Keep the floor dry. Clean up any water on the floor right away. Remove soap buildup in the bathtub or shower. Buildup makes bathtubs and showers slippery. Use non-skid mats or decals on the floor of the bathtub or shower. Attach bath mats securely with double-sided, non-slip rug tape. If you need to sit down in the shower, use a non-slip stool. Install grab bars by the toilet and in the bathtub and shower. Do not use towel bars as grab bars. What can I do in the bedroom? Make sure that you have a light by your bed that is easy to reach. Do not use any sheets or blankets on your bed that hang to the floor. Have a firm chair or bench with side arms that you can use for support when you get dressed. What can I do in the kitchen? Clean up any spills right away. If you need to reach something  above you, use a step stool with a grab bar. Keep electrical cords out of the way. Do not use floor polish or wax that makes floors slippery. What can I do with my stairs? Do not leave anything on the stairs. Make sure that you have a light switch at the top and the bottom of the stairs. Make sure that there are handrails on both sides of the stairs. Fix handrails that are broken or loose. Install non-slip stair treads on all your stairs if they do not have carpet. Avoid having throw rugs at the top or bottom of the stairs. Choose a carpet that does not hide the edge of the steps on the stairs. Make sure that the carpet is firmly attached to the stairs. Fix carpet that is loose or worn. What can I do on the outside of my home? Use bright outdoor lighting. Fix the edges of walkways and driveways and fix any cracks. Clear paths of anything that can make you trip, such as tools or rocks. Add color or contrast paint or tape to clearly mark and help you see anything that might make you trip as you walk through a door, such as a raised step or threshold. Trim any bushes or trees on paths to your home. Check to see if handrails are loose   or broken and that both sides of all steps have handrails. Install guardrails along the edges of any raised decks and porches. Have leaves, snow, or ice cleared regularly. Use sand, salt, or ice melter on paths if you live where there is ice and snow during the winter. Clean up any spills in your garage right away. This includes grease or oil spills. What other actions can I take? Review your medicines with your doctor. Some medicines can cause dizziness or changes in blood pressure, which increase your risk of falling. Wear shoes that: Have a low heel. Do not wear high heels. Have rubber bottoms and are closed at the toe. Feel good on your feet and fit well. Use tools that help you move around if needed. These include: Canes. Walkers. Scooters. Crutches. Ask  your doctor what else you can do to help prevent falls. This may include seeing a physical therapist to learn to do exercises to move better and get stronger. Where to find more information Centers for Disease Control and Prevention, [employer]: cdc.gov National Institute on Aging: nia.nih.gov National Institute on Aging: nia.nih.gov Contact a doctor if: You are afraid of falling at home. You feel weak, drowsy, or dizzy at home. You fall at home. Get help right away if you: Lose consciousness or have trouble moving after a fall. Have a fall that causes a head injury. These symptoms may be an emergency. Get help right away. Call 911. Do not wait to see if the symptoms will go away. Do not drive yourself to the hospital. This information is not intended to replace advice given to you by your health care provider. Make sure you discuss any questions you have with your health care provider. Document Revised: 04/07/2022 Document Reviewed: 04/07/2022 Elsevier Patient Education  2023 [employer]  

## 2023-01-07 LAB — CBC WITH DIFFERENTIAL/PLATELET
Basophils Absolute: 0 10*3/uL (ref 0.0–0.2)
Basos: 0 %
EOS (ABSOLUTE): 0 10*3/uL (ref 0.0–0.4)
Eos: 0 %
Hematocrit: 34.8 % (ref 34.0–46.6)
Hemoglobin: 11.7 g/dL (ref 11.1–15.9)
Immature Grans (Abs): 0 10*3/uL (ref 0.0–0.1)
Immature Granulocytes: 0 %
Lymphocytes Absolute: 1.8 10*3/uL (ref 0.7–3.1)
Lymphs: 19 %
MCH: 32.2 pg (ref 26.6–33.0)
MCHC: 33.6 g/dL (ref 31.5–35.7)
MCV: 96 fL (ref 79–97)
Monocytes Absolute: 0.6 10*3/uL (ref 0.1–0.9)
Monocytes: 6 %
Neutrophils Absolute: 7.3 10*3/uL — ABNORMAL HIGH (ref 1.4–7.0)
Neutrophils: 75 %
Platelets: 225 10*3/uL (ref 150–450)
RBC: 3.63 x10E6/uL — ABNORMAL LOW (ref 3.77–5.28)
RDW: 12 % (ref 11.7–15.4)
WBC: 9.7 10*3/uL (ref 3.4–10.8)

## 2023-01-07 LAB — CMP14+EGFR
ALT: 12 IU/L (ref 0–32)
AST: 20 IU/L (ref 0–40)
Albumin/Globulin Ratio: 1.8 (ref 1.2–2.2)
Albumin: 4.4 g/dL (ref 3.7–4.7)
Alkaline Phosphatase: 78 IU/L (ref 44–121)
BUN/Creatinine Ratio: 18 (ref 12–28)
BUN: 22 mg/dL (ref 8–27)
Bilirubin Total: 0.3 mg/dL (ref 0.0–1.2)
CO2: 24 mmol/L (ref 20–29)
Calcium: 9.4 mg/dL (ref 8.7–10.3)
Chloride: 99 mmol/L (ref 96–106)
Creatinine, Ser: 1.22 mg/dL — ABNORMAL HIGH (ref 0.57–1.00)
Globulin, Total: 2.4 g/dL (ref 1.5–4.5)
Glucose: 129 mg/dL — ABNORMAL HIGH (ref 70–99)
Potassium: 4.5 mmol/L (ref 3.5–5.2)
Sodium: 137 mmol/L (ref 134–144)
Total Protein: 6.8 g/dL (ref 6.0–8.5)
eGFR: 42 mL/min/{1.73_m2} — ABNORMAL LOW (ref >59)

## 2023-01-07 LAB — LIPID PANEL
Chol/HDL Ratio: 2 ratio (ref 0.0–4.4)
Cholesterol, Total: 149 mg/dL (ref 100–199)
HDL: 74 mg/dL (ref >39)
LDL Chol Calc (NIH): 59 mg/dL (ref 0–99)
Triglycerides: 82 mg/dL (ref 0–149)
VLDL Cholesterol Cal: 16 mg/dL (ref 5–40)

## 2023-01-15 NOTE — Progress Notes (Signed)
Carelink Summary Report / Loop Recorder 

## 2023-01-16 ENCOUNTER — Ambulatory Visit (INDEPENDENT_AMBULATORY_CARE_PROVIDER_SITE_OTHER): Payer: Medicare Other

## 2023-01-16 DIAGNOSIS — I495 Sick sinus syndrome: Secondary | ICD-10-CM | POA: Diagnosis not present

## 2023-01-19 LAB — CUP PACEART REMOTE DEVICE CHECK
Date Time Interrogation Session: 20240531231016
Implantable Pulse Generator Implant Date: 20200819

## 2023-01-29 ENCOUNTER — Ambulatory Visit: Payer: Medicare Other | Admitting: Student

## 2023-02-03 NOTE — Progress Notes (Signed)
Carelink Summary Report / Loop Recorder 

## 2023-02-05 ENCOUNTER — Other Ambulatory Visit: Payer: Self-pay | Admitting: Physical Medicine and Rehabilitation

## 2023-02-05 ENCOUNTER — Telehealth: Payer: Self-pay | Admitting: Physical Medicine and Rehabilitation

## 2023-02-05 DIAGNOSIS — M5416 Radiculopathy, lumbar region: Secondary | ICD-10-CM

## 2023-02-05 DIAGNOSIS — M48062 Spinal stenosis, lumbar region with neurogenic claudication: Secondary | ICD-10-CM

## 2023-02-05 NOTE — Telephone Encounter (Signed)
Patient called advised she had an injection in May and the pain has returned in her back and hip. Patient asked if she can get another injection. Patient said the injection did good for about a week and started wearing off.  The number to contact patient is [phone]

## 2023-03-02 ENCOUNTER — Ambulatory Visit: Payer: Medicare Other | Admitting: Physical Medicine and Rehabilitation

## 2023-03-02 ENCOUNTER — Other Ambulatory Visit: Payer: Self-pay

## 2023-03-02 VITALS — BP 110/68 | HR 72

## 2023-03-02 DIAGNOSIS — M5416 Radiculopathy, lumbar region: Secondary | ICD-10-CM | POA: Diagnosis not present

## 2023-03-02 MED ORDER — METHYLPREDNISOLONE ACETATE 80 MG/ML IJ SUSP
80.0000 mg | Freq: Once | INTRAMUSCULAR | Status: AC
Start: 2023-03-02 — End: 2023-03-02
  Administered 2023-03-02: 80 mg

## 2023-03-02 NOTE — Progress Notes (Signed)
Left L3 transforminal ESI- hurts worse when walking- takes tylernol every 4 hrs.   Functional Pain Scale - descriptive words and definitions  Distressing (6)    Pain is present/unable to complete most ADLs limited by pain/sleep is difficult and active distraction is only marginal. Moderate range order  Average Pain 6   +Driver, -BT, -Dye Allergies.

## 2023-03-02 NOTE — Patient Instructions (Signed)

## 2023-03-13 NOTE — Progress Notes (Signed)
Katie [name] - 89 y.o. female MRN [id number]  Date of birth: [birth year]  Office Visit Note: Visit Date: 03/02/2023 PCP: Bennie Pierini, FNP Referred by: Daphine Deutscher, Mary-Margaret, *  Subjective: Chief Complaint  Patient presents with   Lower Back - Pain   HPI:  Katie [name] is a 89 y.o. female who comes in today at the request of Dr. Annell Greening for planned Left L3-4 Lumbar Transforaminal epidural steroid injection with fluoroscopic guidance.  The patient has failed conservative care including home exercise, medications, time and activity modification.  This injection will be diagnostic and hopefully therapeutic.  Please see requesting physician notes for further details and justification.   ROS Otherwise per HPI.  Assessment & Plan: Visit Diagnoses:    ICD-10-CM   1. Lumbar radiculopathy  M54.16 XR C-ARM NO REPORT    Epidural Steroid injection    methylPREDNISolone acetate (DEPO-MEDROL) injection 80 mg      Plan: No additional findings.   Meds & Orders:  Meds ordered this encounter  Medications   methylPREDNISolone acetate (DEPO-MEDROL) injection 80 mg    Orders Placed This Encounter  Procedures   XR C-ARM NO REPORT   Epidural Steroid injection    Follow-up: Return for visit to requesting provider as needed.   Procedures: No procedures performed  Lumbosacral Transforaminal Epidural Steroid Injection - Sub-Pedicular Approach with Fluoroscopic Guidance  Patient: Katie [name]      Date of Birth: 07-May-1932 MRN: [id number] PCP: Bennie Pierini, FNP      Visit Date: 03/02/2023   Universal Protocol:    Date/Time: 03/02/2023  Consent Given By: the patient  Position: PRONE  Additional Comments: Vital signs were monitored before and after the procedure. Patient was prepped and draped in the usual sterile fashion. The correct patient, procedure, and site was verified.   Injection Procedure Details:   Procedure diagnoses: Lumbar radiculopathy [M54.16]     Meds Administered:  Meds ordered this encounter  Medications   methylPREDNISolone acetate (DEPO-MEDROL) injection 80 mg    Laterality: Left  Location/Site: L3  Needle:5.0 in., 22 ga.  Short bevel or Quincke spinal needle  Needle Placement: Transforaminal  Findings:    -Comments: Excellent flow of contrast along the nerve, nerve root and into the epidural space.  Procedure Details: After squaring off the end-plates to get a true AP view, the C-arm was positioned so that an oblique view of the foramen as noted above was visualized. The target area is just inferior to the "nose of the scotty dog" or sub pedicular. The soft tissues overlying this structure were infiltrated with 2-3 ml. of 1% Lidocaine without Epinephrine.  The spinal needle was inserted toward the target using a "trajectory" view along the fluoroscope beam.  Under AP and lateral visualization, the needle was advanced so it did not puncture dura and was located close the 6 O'Clock position of the pedical in AP tracterory. Biplanar projections were used to confirm position. Aspiration was confirmed to be negative for CSF and/or blood. A 1-2 ml. volume of Isovue-250 was injected and flow of contrast was noted at each level. Radiographs were obtained for documentation purposes.   After attaining the desired flow of contrast documented above, a 0.5 to 1.0 ml test dose of 0.25% Marcaine was injected into each respective transforaminal space.  The patient was observed for 90 seconds post injection.  After no sensory deficits were reported, and normal lower extremity motor function was noted,   the above injectate was administered so  that equal amounts of the injectate were placed at each foramen (level) into the transforaminal epidural space.   Additional Comments:  No complications occurred Dressing: 2 x 2 sterile gauze and Band-Aid    Post-procedure details: Patient was observed during the procedure. Post-procedure  instructions were reviewed.  Patient left the clinic in stable condition.    Clinical History: MRI LUMBAR SPINE WITHOUT CONTRAST   TECHNIQUE: Multiplanar, multisequence MR imaging of the lumbar spine was performed. No intravenous contrast was administered.   COMPARISON:  CT lumbar spine 04/13/2015.   FINDINGS: Segmentation:  Standard.   Alignment:  Convex right scoliosis noted.   Vertebrae: No acute abnormality or worrisome marrow lesion. Multiple Schmorl's nodes are seen. Mild, remote superior endplate compression fracture of T12 is unchanged.   Conus medullaris and cauda equina: Conus extends to the L1-2 level. Conus and cauda equina appear normal.   Paraspinal and other soft tissues: Right renal cyst is identified.   Disc levels:   T11-12 is imaged in the sagittal plane only. There is a minimal bulge without stenosis.   T12-L1: Mild-to-moderate facet degenerative change. Otherwise negative.   L1-2: Shallow disc bulge to the right and mild ligamentum flavum thickening. Mild facet arthropathy. No stenosis.   L2-3: Loss of disc space height with a shallow bulge and superimposed left subarticular recess and foraminal protrusion. There is mild to moderate central canal stenosis. Moderate narrowing in the left subarticular recess and foramen is also identified. The right foramen is open.   L3-4: Ligamentum flavum thickening and a broad-based disc bulge cause moderately severe central canal stenosis and right worse than left subarticular recess narrowing. Mild to moderate foraminal narrowing is worse on the right.   L4-5: Bulky ligamentum flavum thickening, broad-based disc bulge and facet degenerative disease worse on the right where there is marrow edema in the right pedicles consistent with secondary stress change. There is moderately severe to severe central canal stenosis and marked narrowing in the right subarticular recess and foramen. Mild to moderate left  foraminal narrowing is present.   L5-S1: Shallow disc bulge with facet degenerative disease. No stenosis.   IMPRESSION: No acute abnormality.  Remote T12 compression fracture is unchanged.   Spondylosis worst at L4-5 where there is moderately severe to severe central canal stenosis and marked narrowing in the right subarticular recess and foramen with encroachment on the exiting right L4 and descending right L5 roots.   Moderately severe central canal stenosis and right worse than left subarticular recess narrowing at L3-4.   Moderate narrowing in the left subarticular recess and foramen at L2-3 where there is mild to moderate central canal stenosis overall.     Electronically Signed   By: Drusilla Kanner M.D.   On: 05/18/2019 09:28     Objective:  VS:  HT:    WT:   BMI:     BP:110/68  HR:72bpm  TEMP: ( )  RESP:  Physical Exam Vitals and nursing note reviewed.  Constitutional:      General: She is not in acute distress.    Appearance: Normal appearance. She is not ill-appearing.  HENT:     Head: Normocephalic and atraumatic.     Right Ear: External ear normal.     Left Ear: External ear normal.  Eyes:     Extraocular Movements: Extraocular movements intact.  Cardiovascular:     Rate and Rhythm: Normal rate.     Pulses: Normal pulses.  Pulmonary:     Effort: Pulmonary effort  is normal. No respiratory distress.  Abdominal:     General: There is no distension.     Palpations: Abdomen is soft.  Musculoskeletal:        General: Tenderness present.     Cervical back: Neck supple.     Right lower leg: No edema.     Left lower leg: No edema.     Comments: Patient has good distal strength with no pain over the greater trochanters.  No clonus or focal weakness.  Skin:    Findings: No erythema, lesion or rash.  Neurological:     General: No focal deficit present.     Mental Status: She is alert and oriented to person, place, and time.     Sensory: No sensory  deficit.     Motor: No weakness or abnormal muscle tone.     Coordination: Coordination normal.  Psychiatric:        Mood and Affect: Mood normal.        Behavior: Behavior normal.      Imaging: No results found.

## 2023-03-13 NOTE — Procedures (Signed)
Lumbosacral Transforaminal Epidural Steroid Injection - Sub-Pedicular Approach with Fluoroscopic Guidance  Patient: Katie [name]      Date of Birth: 1932/05/18 MRN: [id number] PCP: Bennie Pierini, FNP      Visit Date: 03/02/2023   Universal Protocol:    Date/Time: 03/02/2023  Consent Given By: the patient  Position: PRONE  Additional Comments: Vital signs were monitored before and after the procedure. Patient was prepped and draped in the usual sterile fashion. The correct patient, procedure, and site was verified.   Injection Procedure Details:   Procedure diagnoses: Lumbar radiculopathy [M54.16]    Meds Administered:  Meds ordered this encounter  Medications   methylPREDNISolone acetate (DEPO-MEDROL) injection 80 mg    Laterality: Left  Location/Site: L3  Needle:5.0 in., 22 ga.  Short bevel or Quincke spinal needle  Needle Placement: Transforaminal  Findings:    -Comments: Excellent flow of contrast along the nerve, nerve root and into the epidural space.  Procedure Details: After squaring off the end-plates to get a true AP view, the C-arm was positioned so that an oblique view of the foramen as noted above was visualized. The target area is just inferior to the "nose of the scotty dog" or sub pedicular. The soft tissues overlying this structure were infiltrated with 2-3 ml. of 1% Lidocaine without Epinephrine.  The spinal needle was inserted toward the target using a "trajectory" view along the fluoroscope beam.  Under AP and lateral visualization, the needle was advanced so it did not puncture dura and was located close the 6 O'Clock position of the pedical in AP tracterory. Biplanar projections were used to confirm position. Aspiration was confirmed to be negative for CSF and/or blood. A 1-2 ml. volume of Isovue-250 was injected and flow of contrast was noted at each level. Radiographs were obtained for documentation purposes.   After attaining the desired  flow of contrast documented above, a 0.5 to 1.0 ml test dose of 0.25% Marcaine was injected into each respective transforaminal space.  The patient was observed for 90 seconds post injection.  After no sensory deficits were reported, and normal lower extremity motor function was noted,   the above injectate was administered so that equal amounts of the injectate were placed at each foramen (level) into the transforaminal epidural space.   Additional Comments:  No complications occurred Dressing: 2 x 2 sterile gauze and Band-Aid    Post-procedure details: Patient was observed during the procedure. Post-procedure instructions were reviewed.  Patient left the clinic in stable condition.

## 2023-03-17 ENCOUNTER — Telehealth: Payer: Self-pay | Admitting: Physical Medicine and Rehabilitation

## 2023-03-17 NOTE — Telephone Encounter (Signed)
Spoke with patient this morning, she reports last (2) transforaminal epidural steroid injections performed in May and June did not help to alleviate her pain. I explained to her that we do not continue with injection therapy is she is not getting lasting relief. At this point, would not recommend further interventional spine procedures. Treatment options would be limited, could benefit from chronic pain management. Patient verbalized that she would like to be seen by Dr. Ophelia Charter. I informed her she could call and schedule appointment.

## 2023-03-17 NOTE — Telephone Encounter (Signed)
Patient called advised neither of the injections worked and she want to know what her next plan of care was? The number to contact patient is [phone]

## 2023-04-21 ENCOUNTER — Ambulatory Visit (INDEPENDENT_AMBULATORY_CARE_PROVIDER_SITE_OTHER): Payer: Medicare Other | Admitting: Orthopaedic Surgery

## 2023-04-21 VITALS — BP 151/81 | HR 86

## 2023-04-21 DIAGNOSIS — M48062 Spinal stenosis, lumbar region with neurogenic claudication: Secondary | ICD-10-CM | POA: Diagnosis not present

## 2023-04-21 DIAGNOSIS — M5416 Radiculopathy, lumbar region: Secondary | ICD-10-CM

## 2023-04-21 NOTE — Progress Notes (Signed)
Office Visit Note   Patient: Katie [name]           Date of Birth: 23-Dec-1931           MRN: [id number] Visit Date: 04/21/2023              Requested by: Bennie Pierini, FNP [address] [place],  [place] [address] PCP: Bennie Pierini, FNP   Assessment & Plan: Visit Diagnoses:  1. Lumbar radiculopathy   2. Spinal stenosis of lumbar region with neurogenic claudication   3.      Lower extremity weakness  Plan: Will refer patient for some physical therapy for lower extremity strengthening.  She can follow-up with me in 1 to 2 months.  Follow-Up Instructions: Return in about 1 month (around 05/21/2023).   Orders:  Orders Placed This Encounter  Procedures   Ambulatory referral to Physical Therapy   No orders of the defined types were placed in this encounter.     Procedures: No procedures performed   Clinical Data: No additional findings.   Subjective: Chief Complaint  Patient presents with   Left Hip - Pain   Lower Back - Pain    HPI 89 year old female have not seen in over a year.  She has had problems with constant back pain has severe central stenosis at L4-5.  She had a history of left inotrope fracture and when she stands she has pain that radiates into her buttocks she has to lean on a grocery cart with ambulation.  She had intramedullary nail fixation for hip fracture by Dr. Jena Gauss in February 2021.  Lumbar MRI 2020 showed moderately severe central stenosis L3-4 and moderate least severe to severe central stenosis at L4-5.  Review of Systems patient has a history of heart failure stage III kidney disease type 2 diabetes hypertension peripheral edema she did not wear her support stockings today.  Previous hip fracture surgery completely healed on the left.   Objective: Vital Signs: BP (!) 151/81   Pulse 86   Physical Exam Constitutional:      Appearance: She is well-developed.  HENT:     Head: Normocephalic.     Right Ear: External ear  normal.     Left Ear: External ear normal. There is no impacted cerumen.  Eyes:     Pupils: Pupils are equal, round, and reactive to light.  Neck:     Thyroid: No thyromegaly.     Trachea: No tracheal deviation.  Cardiovascular:     Rate and Rhythm: Normal rate.  Pulmonary:     Effort: Pulmonary effort is normal.  Abdominal:     Palpations: Abdomen is soft.  Musculoskeletal:     Cervical back: No rigidity.  Skin:    General: Skin is warm and dry.  Neurological:     Mental Status: She is alert and oriented to person, place, and time.  Psychiatric:        Behavior: Behavior normal.     Ortho Exam patient has intact reflexes no pain with hip range of motion some tenderness over the greater trochanter.  Overall decrease strength lower extremities quads ankle dorsiflexion plantarflexion.  She does have intact pulses.  Specialty Comments:  MRI LUMBAR SPINE WITHOUT CONTRAST   TECHNIQUE: Multiplanar, multisequence MR imaging of the lumbar spine was performed. No intravenous contrast was administered.   COMPARISON:  CT lumbar spine 04/13/2015.   FINDINGS: Segmentation:  Standard.   Alignment:  Convex right scoliosis noted.   Vertebrae: No  acute abnormality or worrisome marrow lesion. Multiple Schmorl's nodes are seen. Mild, remote superior endplate compression fracture of T12 is unchanged.   Conus medullaris and cauda equina: Conus extends to the L1-2 level. Conus and cauda equina appear normal.   Paraspinal and other soft tissues: Right renal cyst is identified.   Disc levels:   T11-12 is imaged in the sagittal plane only. There is a minimal bulge without stenosis.   T12-L1: Mild-to-moderate facet degenerative change. Otherwise negative.   L1-2: Shallow disc bulge to the right and mild ligamentum flavum thickening. Mild facet arthropathy. No stenosis.   L2-3: Loss of disc space height with a shallow bulge and superimposed left subarticular recess and foraminal  protrusion. There is mild to moderate central canal stenosis. Moderate narrowing in the left subarticular recess and foramen is also identified. The right foramen is open.   L3-4: Ligamentum flavum thickening and a broad-based disc bulge cause moderately severe central canal stenosis and right worse than left subarticular recess narrowing. Mild to moderate foraminal narrowing is worse on the right.   L4-5: Bulky ligamentum flavum thickening, broad-based disc bulge and facet degenerative disease worse on the right where there is marrow edema in the right pedicles consistent with secondary stress change. There is moderately severe to severe central canal stenosis and marked narrowing in the right subarticular recess and foramen. Mild to moderate left foraminal narrowing is present.   L5-S1: Shallow disc bulge with facet degenerative disease. No stenosis.   IMPRESSION: No acute abnormality.  Remote T12 compression fracture is unchanged.   Spondylosis worst at L4-5 where there is moderately severe to severe central canal stenosis and marked narrowing in the right subarticular recess and foramen with encroachment on the exiting right L4 and descending right L5 roots.   Moderately severe central canal stenosis and right worse than left subarticular recess narrowing at L3-4.   Moderate narrowing in the left subarticular recess and foramen at L2-3 where there is mild to moderate central canal stenosis overall.     Electronically Signed   By: Drusilla Kanner M.D.   On: 05/18/2019 09:28  Imaging: No results found.   PMFS History: Patient Active Problem List   Diagnosis Date Noted   Spinal stenosis of lumbar region 04/22/2023   Breakage of internal fixation device in bone (HCC) 11/22/2019   Displaced intertrochanteric fracture of left femur, initial encounter for closed fracture (HCC) 09/22/2019   Hypomagnesemia 08/12/2019   Regurgitation of food 07/20/2019   Chronic midline  low back pain without sciatica 10/22/2018   Primary insomnia 10/22/2018   Persistent atrial fibrillation (HCC) 04/27/2017   Chronic diastolic CHF (congestive heart failure) (HCC) 04/27/2017   CKD (chronic kidney disease) stage 3, GFR 30-59 ml/min (HCC) 05/14/2015   BMI 26.0-26.9,adult 05/11/2015   Peripheral edema 07/06/2014   Hypokalemia 07/22/2013   Hypothyroidism 07/22/2013   Osteopenia, senile 03/09/2013   Constipation 01/04/2013   Hypertension 05/07/2012   Hyperlipidemia 05/07/2012   Diabetes mellitus type 2, diet-controlled (HCC) 05/07/2012   GERD (gastroesophageal reflux disease) 05/07/2012   Past Medical History:  Diagnosis Date   Anxiety    Aortic insufficiency    a. Trivial AI by echo 02/2016.   Atrial fibrillation and flutter (HCC)    a. Coarse afib vs flutter by EKG 12/2015.   Cancer (HCC)    skin cancer   Cataract    Chronic diastolic CHF (congestive heart failure) (HCC)    CKD (chronic kidney disease), stage III (HCC)    COVID-19  GERD (gastroesophageal reflux disease)    Hiatal hernia    Hypercholesterolemia    Hypertension    Hypokalemia    Hypothyroidism    Left knee pain    NIDDM (non-insulin dependent diabetes mellitus)    diet controlled    Osteoporosis    Premature atrial contractions    PVC's (premature ventricular contractions)    Vitamin D deficiency     Family History  Problem Relation Age of Onset   Diabetes Mother    Stroke Mother    Heart disease Father    Stroke Father    Uterine cancer Sister    Diabetes Sister    Ovarian cancer Sister    Colon cancer Sister    Diabetes Sister    Liver cancer Sister        \   Atrial fibrillation Sister    Diabetes Sister    Diabetes Brother    Dementia Brother    Diabetes Son    Healthy Son    Heart attack Neg Hx    Hypertension Neg Hx    Esophageal cancer Neg Hx    Rectal cancer Neg Hx    Stomach cancer Neg Hx    Breast cancer Neg Hx     Past Surgical History:  Procedure Laterality  Date   A-FLUTTER ABLATION N/A 02/08/2019   Procedure: A-FLUTTER ABLATION;  Surgeon: Hillis Range, MD;  Location: [facility];  Service: Cardiovascular;  Laterality: N/A;   ABDOMINAL HYSTERECTOMY     APPENDECTOMY  1980   BACK SURGERY     CATARACT EXTRACTION, BILATERAL     CHOLECYSTECTOMY  5/00   COLONOSCOPY     implantable loop recorder placement  04/06/2019   MDT Reveal LINQ [id number] ([id number]) implanted for evaluation of palpitations and afib post atrial flutter ablation by Dr Johney Frame in office   INTRAMEDULLARY (IM) NAIL INTERTROCHANTERIC Left 09/23/2019   Procedure: INTRAMEDULLARY (IM) NAIL INTERTROCHANTRIC;  Surgeon: Roby Lofts, MD;  Location: [facility];  Service: Orthopedics;  Laterality: Left;   TONSILLECTOMY     TOTAL ABDOMINAL HYSTERECTOMY W/ BILATERAL SALPINGOOPHORECTOMY  1980   UPPER GASTROINTESTINAL ENDOSCOPY     Social History   Occupational History   Occupation: Retired    Comment: Ambulance person , golf, farm   Tobacco Use   Smoking status: Never   Smokeless tobacco: Never  Vaping Use   Vaping status: Never Used  Substance and Sexual Activity   Alcohol use: No   Drug use: No   Sexual activity: Not Currently

## 2023-04-22 DIAGNOSIS — M48061 Spinal stenosis, lumbar region without neurogenic claudication: Secondary | ICD-10-CM | POA: Insufficient documentation

## 2023-04-24 ENCOUNTER — Other Ambulatory Visit: Payer: Self-pay | Admitting: Nurse Practitioner

## 2023-04-24 DIAGNOSIS — M545 Low back pain, unspecified: Secondary | ICD-10-CM

## 2023-04-29 DIAGNOSIS — L814 Other melanin hyperpigmentation: Secondary | ICD-10-CM | POA: Diagnosis not present

## 2023-04-29 DIAGNOSIS — Z85828 Personal history of other malignant neoplasm of skin: Secondary | ICD-10-CM | POA: Diagnosis not present

## 2023-04-29 DIAGNOSIS — L821 Other seborrheic keratosis: Secondary | ICD-10-CM | POA: Diagnosis not present

## 2023-04-29 DIAGNOSIS — L570 Actinic keratosis: Secondary | ICD-10-CM | POA: Diagnosis not present

## 2023-04-29 DIAGNOSIS — Z08 Encounter for follow-up examination after completed treatment for malignant neoplasm: Secondary | ICD-10-CM | POA: Diagnosis not present

## 2023-04-30 ENCOUNTER — Encounter: Payer: Self-pay | Admitting: Physical Therapy

## 2023-04-30 ENCOUNTER — Ambulatory Visit: Payer: Medicare Other | Attending: Orthopaedic Surgery | Admitting: Physical Therapy

## 2023-04-30 ENCOUNTER — Other Ambulatory Visit: Payer: Self-pay

## 2023-04-30 DIAGNOSIS — R293 Abnormal posture: Secondary | ICD-10-CM | POA: Insufficient documentation

## 2023-04-30 DIAGNOSIS — M5459 Other low back pain: Secondary | ICD-10-CM | POA: Diagnosis not present

## 2023-04-30 DIAGNOSIS — M62830 Muscle spasm of back: Secondary | ICD-10-CM | POA: Diagnosis not present

## 2023-04-30 DIAGNOSIS — M5416 Radiculopathy, lumbar region: Secondary | ICD-10-CM | POA: Insufficient documentation

## 2023-04-30 NOTE — Therapy (Signed)
OUTPATIENT PHYSICAL THERAPY THORACOLUMBAR EVALUATION   Patient Name: Katie [name] MRN: [id number] DOB:1932-05-06, 89 y.o., female Today's Date: 04/30/2023  END OF SESSION:  PT End of Session - 04/30/23 1410     Visit Number 1    Number of Visits 6    Date for PT Re-Evaluation 06/11/23    Authorization Type FOTO    PT Start Time 0140    PT Stop Time 0230    PT Time Calculation (min) 50 min    Activity Tolerance Patient tolerated treatment well    Behavior During Therapy [facility] for tasks assessed/performed             Past Medical History:  Diagnosis Date   Anxiety    Aortic insufficiency    a. Trivial AI by echo 02/2016.   Atrial fibrillation and flutter (HCC)    a. Coarse afib vs flutter by EKG 12/2015.   Cancer (HCC)    skin cancer   Cataract    Chronic diastolic CHF (congestive heart failure) (HCC)    CKD (chronic kidney disease), stage III (HCC)    COVID-19    GERD (gastroesophageal reflux disease)    Hiatal hernia    Hypercholesterolemia    Hypertension    Hypokalemia    Hypothyroidism    Left knee pain    NIDDM (non-insulin dependent diabetes mellitus)    diet controlled    Osteoporosis    Premature atrial contractions    PVC's (premature ventricular contractions)    Vitamin D deficiency    Past Surgical History:  Procedure Laterality Date   A-FLUTTER ABLATION N/A 02/08/2019   Procedure: A-FLUTTER ABLATION;  Surgeon: Hillis Range, MD;  Location: [facility];  Service: Cardiovascular;  Laterality: N/A;   ABDOMINAL HYSTERECTOMY     APPENDECTOMY  1980   BACK SURGERY     CATARACT EXTRACTION, BILATERAL     CHOLECYSTECTOMY  5/00   COLONOSCOPY     implantable loop recorder placement  04/06/2019   MDT Reveal LINQ [id number] ([id number]) implanted for evaluation of palpitations and afib post atrial flutter ablation by Dr Johney Frame in office   INTRAMEDULLARY (IM) NAIL INTERTROCHANTERIC Left 09/23/2019   Procedure: INTRAMEDULLARY (IM) NAIL INTERTROCHANTRIC;   Surgeon: Roby Lofts, MD;  Location: [facility] OR;  Service: Orthopedics;  Laterality: Left;   TONSILLECTOMY     TOTAL ABDOMINAL HYSTERECTOMY W/ BILATERAL SALPINGOOPHORECTOMY  1980   UPPER GASTROINTESTINAL ENDOSCOPY     Patient Active Problem List   Diagnosis Date Noted   Spinal stenosis of lumbar region 04/22/2023   Breakage of internal fixation device in bone (HCC) 11/22/2019   Displaced intertrochanteric fracture of left femur, initial encounter for closed fracture (HCC) 09/22/2019   Hypomagnesemia 08/12/2019   Regurgitation of food 07/20/2019   Chronic midline low back pain without sciatica 10/22/2018   Primary insomnia 10/22/2018   Persistent atrial fibrillation (HCC) 04/27/2017   Chronic diastolic CHF (congestive heart failure) (HCC) 04/27/2017   CKD (chronic kidney disease) stage 3, GFR 30-59 ml/min (HCC) 05/14/2015   BMI 26.0-26.9,adult 05/11/2015   Peripheral edema 07/06/2014   Hypokalemia 07/22/2013   Hypothyroidism 07/22/2013   Osteopenia, senile 03/09/2013   Constipation 01/04/2013   Hypertension 05/07/2012   Hyperlipidemia 05/07/2012   Diabetes mellitus type 2, diet-controlled (HCC) 05/07/2012   GERD (gastroesophageal reflux disease) 05/07/2012      REFERRING PROVIDER: Annell Greening MD  REFERRING DIAG: Lumbar radiculopathy  Rationale for Evaluation and Treatment: Rehabilitation  THERAPY DIAG:  Other low back pain  Muscle spasm of back  Abnormal posture  ONSET DATE: 2020.  SUBJECTIVE:                                                                                                                                                                                           SUBJECTIVE STATEMENT: The patient presents to the clinic with c/o low back pain left > right since 2020 and pain radiation into the left buttock and posterior thigh that has gotten worse since a hip fracture and subsequent ORIF on 10/13/19.  She reports her pain decreases with sitting and medication  but increases when she is standing and walking.  Her pain is rated around a 6-7/10.  She uses a FWW for safe ambulation.  She has an "Even-up" left shoe lift.  PERTINENT HISTORY:  OP, left hip ORIF.  PAIN:  Are you having pain? Yes: NPRS scale: 6-7/10 Pain location: Left low back. Pain description: Ache, hurt. Aggravating factors: As above. Relieving factors: As above.  PRECAUTIONS: Fall  RED FLAGS: None   WEIGHT BEARING RESTRICTIONS: No  FALLS:  Has patient fallen in last 6 months? Yes. Number of falls 1.  LIVING ENVIRONMENT: Lives in: House/apartment Stairs: No Has following equipment at home: Dan Humphreys - 2 wheeled  OCCUPATION: Retired.  PLOF: Independent with basic ADLs and Independent with household mobility with device  PATIENT GOALS: Decrease pain especially when walking.    OBJECTIVE:   DIAGNOSTIC FINDINGS:   IMPRESSION:  05/18/19. No acute abnormality.  Remote T12 compression fracture is unchanged.   Spondylosis worst at L4-5 where there is moderately severe to severe central canal stenosis and marked narrowing in the right subarticular recess and foramen with encroachment on the exiting right L4 and descending right L5 roots.   Moderately severe central canal stenosis and right worse than left subarticular recess narrowing at L3-4.   Moderate narrowing in the left subarticular recess and foramen at L2-3 where there is mild to moderate central canal stenosis overall.  PATIENT SURVEYS:  FOTO 55.73.     POSTURE: rounded shoulders, forward head, decreased lumbar lordosis, and flexed trunk , genu valgum right > left.  PALPATION: Tender to palpation over left lower lumbar region and especially the left SIJ.  LOWER EXTREMITY ROM:   WFL, assessed in supine.    LOWER EXTREMITY MMT:    Left hip flexion and abduction is 4/5.  Bilateral knee and ankle strength is normal.  LUMBAR SPECIAL TESTS:  No pain increase with SLR testing.   GAIT: The  patient walks safely (in some lumbar flexion) with a FWW.  TODAY'S TREATMENT:  DATE: HMP and IFC at 80-150 Hz on 40% scan x 20 minutes to patient's left low back.  Normal modality response following removal of modality.  PATIENT EDUCATION:    HOME EXERCISE PROGRAM:  ASSESSMENT:  CLINICAL IMPRESSION: The patient presents to [facility] with c/o chronic low back pain mostly on the left with pain that radiates into her left posterior thigh with walking and standing. She reports a pain increase after a hip fracture and subsequent ORIF.  She uses a external left show lift ("Even-up".)  She was tender to palpation over her left lower lumbar region and  SIJ.  She has some left hip weakness and multiple postural abnormalities.  She walks safely with a FWW.  Patient will benefit from skilled physical therapy intervention to address pain and deficits.   OBJECTIVE IMPAIRMENTS: Abnormal gait, decreased activity tolerance, decreased strength, increased muscle spasms, postural dysfunction, and pain.   ACTIVITY LIMITATIONS: standing and locomotion level  PARTICIPATION LIMITATIONS: meal prep, cleaning, and laundry  PERSONAL FACTORS: Time since onset of injury/illness/exacerbation and 1 comorbidity: left hip fracture and surgery  are also affecting patient's functional outcome.   REHAB POTENTIAL: Good  CLINICAL DECISION MAKING: Stable/uncomplicated  EVALUATION COMPLEXITY: Low   GOALS:  LONG TERM GOALS: Target date: 06/11/23  Ind with a HEP.  Goal status: INITIAL  2.  Stand 20 minutes with pain not > 3/10.  Goal status: INITIAL  3.  Walk a community distance with pain not > 3-4/10.  Goal status: INITIAL  4.  Eliminate left LE symptoms.  Goal status: INITIAL  PLAN:  PT FREQUENCY: 1x/week (patient request).  PT DURATION: 6 weeks  PLANNED INTERVENTIONS: Therapeutic  exercises, Therapeutic activity, Gait training, Patient/Family education, Self Care, Dry Needling, Electrical stimulation, Cryotherapy, Moist heat, Ultrasound, and Manual therapy.  PLAN FOR NEXT SESSION: Modalities and STW/M as needed.  Hip bridges.  Low-level core exercises.   Norvin Ohlin, [place], PT 04/30/2023, 3:12 PM

## 2023-05-05 ENCOUNTER — Ambulatory Visit: Payer: Medicare Other | Admitting: Nurse Practitioner

## 2023-05-06 ENCOUNTER — Encounter: Payer: Self-pay | Admitting: Nurse Practitioner

## 2023-05-07 ENCOUNTER — Ambulatory Visit: Payer: Medicare Other | Admitting: *Deleted

## 2023-05-07 ENCOUNTER — Encounter: Payer: Self-pay | Admitting: *Deleted

## 2023-05-07 DIAGNOSIS — M5459 Other low back pain: Secondary | ICD-10-CM | POA: Diagnosis not present

## 2023-05-07 DIAGNOSIS — R293 Abnormal posture: Secondary | ICD-10-CM | POA: Diagnosis not present

## 2023-05-07 DIAGNOSIS — M62830 Muscle spasm of back: Secondary | ICD-10-CM

## 2023-05-07 DIAGNOSIS — M5416 Radiculopathy, lumbar region: Secondary | ICD-10-CM | POA: Diagnosis not present

## 2023-05-07 NOTE — Therapy (Signed)
OUTPATIENT PHYSICAL THERAPY THORACOLUMBAR EVALUATION   Patient Name: Katie [name] MRN: [id number] DOB:18-Apr-1932, 89 y.o., female Today's Date: 05/07/2023  END OF SESSION:  PT End of Session - 05/07/23 1423     Visit Number 2    Number of Visits 6    Date for PT Re-Evaluation 06/11/23    Authorization Type FOTO    PT Start Time 1345    PT Stop Time 1434    PT Time Calculation (min) 49 min             Past Medical History:  Diagnosis Date   Anxiety    Aortic insufficiency    a. Trivial AI by echo 02/2016.   Atrial fibrillation and flutter (HCC)    a. Coarse afib vs flutter by EKG 12/2015.   Cancer (HCC)    skin cancer   Cataract    Chronic diastolic CHF (congestive heart failure) (HCC)    CKD (chronic kidney disease), stage III (HCC)    COVID-19    GERD (gastroesophageal reflux disease)    Hiatal hernia    Hypercholesterolemia    Hypertension    Hypokalemia    Hypothyroidism    Left knee pain    NIDDM (non-insulin dependent diabetes mellitus)    diet controlled    Osteoporosis    Premature atrial contractions    PVC's (premature ventricular contractions)    Vitamin D deficiency    Past Surgical History:  Procedure Laterality Date   A-FLUTTER ABLATION N/A 02/08/2019   Procedure: A-FLUTTER ABLATION;  Surgeon: Hillis Range, MD;  Location: [facility];  Service: Cardiovascular;  Laterality: N/A;   ABDOMINAL HYSTERECTOMY     APPENDECTOMY  1980   BACK SURGERY     CATARACT EXTRACTION, BILATERAL     CHOLECYSTECTOMY  5/00   COLONOSCOPY     implantable loop recorder placement  04/06/2019   MDT Reveal LINQ [id number] ([id number]) implanted for evaluation of palpitations and afib post atrial flutter ablation by Dr Johney Frame in office   INTRAMEDULLARY (IM) NAIL INTERTROCHANTERIC Left 09/23/2019   Procedure: INTRAMEDULLARY (IM) NAIL INTERTROCHANTRIC;  Surgeon: Roby Lofts, MD;  Location: [facility] OR;  Service: Orthopedics;  Laterality: Left;   TONSILLECTOMY     TOTAL  ABDOMINAL HYSTERECTOMY W/ BILATERAL SALPINGOOPHORECTOMY  1980   UPPER GASTROINTESTINAL ENDOSCOPY     Patient Active Problem List   Diagnosis Date Noted   Spinal stenosis of lumbar region 04/22/2023   Breakage of internal fixation device in bone (HCC) 11/22/2019   Displaced intertrochanteric fracture of left femur, initial encounter for closed fracture (HCC) 09/22/2019   Hypomagnesemia 08/12/2019   Regurgitation of food 07/20/2019   Chronic midline low back pain without sciatica 10/22/2018   Primary insomnia 10/22/2018   Persistent atrial fibrillation (HCC) 04/27/2017   Chronic diastolic CHF (congestive heart failure) (HCC) 04/27/2017   CKD (chronic kidney disease) stage 3, GFR 30-59 ml/min (HCC) 05/14/2015   BMI 26.0-26.9,adult 05/11/2015   Peripheral edema 07/06/2014   Hypokalemia 07/22/2013   Hypothyroidism 07/22/2013   Osteopenia, senile 03/09/2013   Constipation 01/04/2013   Hypertension 05/07/2012   Hyperlipidemia 05/07/2012   Diabetes mellitus type 2, diet-controlled (HCC) 05/07/2012   GERD (gastroesophageal reflux disease) 05/07/2012      REFERRING PROVIDER: Annell Greening MD  REFERRING DIAG: Lumbar radiculopathy  Rationale for Evaluation and Treatment: Rehabilitation  THERAPY DIAG:  Other low back pain  Muscle spasm of back  Abnormal posture  ONSET DATE: 2020.  SUBJECTIVE:  SUBJECTIVE STATEMENT: The patient presents to the clinic with c/o low back pain left > right since 2020 and pain radiation into the left buttock and posterior thigh that has gotten worse since a hip fracture and subsequent ORIF on 10/13/19.  She reports her pain decreases with sitting and medication but increases when she is standing and walking.  Her pain is rated around a 6-7/10.  She uses a FWW for safe  ambulation.  She has an "Even-up" left shoe lift.  PERTINENT HISTORY:  OP, left hip ORIF.  PAIN:  Are you having pain? Yes: NPRS scale: 6-7/10 Pain location: Left low back. Pain description: Ache, hurt. Aggravating factors: As above. Relieving factors: As above.  PRECAUTIONS: Fall  RED FLAGS: None   WEIGHT BEARING RESTRICTIONS: No  FALLS:  Has patient fallen in last 6 months? Yes. Number of falls 1.  LIVING ENVIRONMENT: Lives in: House/apartment Stairs: No Has following equipment at home: Dan Humphreys - 2 wheeled  OCCUPATION: Retired.  PLOF: Independent with basic ADLs and Independent with household mobility with device  PATIENT GOALS: Decrease pain especially when walking.    OBJECTIVE:   DIAGNOSTIC FINDINGS:   IMPRESSION:  05/18/19. No acute abnormality.  Remote T12 compression fracture is unchanged.   Spondylosis worst at L4-5 where there is moderately severe to severe central canal stenosis and marked narrowing in the right subarticular recess and foramen with encroachment on the exiting right L4 and descending right L5 roots.   Moderately severe central canal stenosis and right worse than left subarticular recess narrowing at L3-4.   Moderate narrowing in the left subarticular recess and foramen at L2-3 where there is mild to moderate central canal stenosis overall.  PATIENT SURVEYS:  FOTO 55.73.     POSTURE: rounded shoulders, forward head, decreased lumbar lordosis, and flexed trunk , genu valgum right > left.  PALPATION: Tender to palpation over left lower lumbar region and especially the left SIJ.  LOWER EXTREMITY ROM:   WFL, assessed in supine.    LOWER EXTREMITY MMT:    Left hip flexion and abduction is 4/5.  Bilateral knee and ankle strength is normal.  LUMBAR SPECIAL TESTS:  No pain increase with SLR testing.   GAIT: The patient walks safely (in some lumbar flexion) with a FWW.  TODAY'S TREATMENT:                                                                                                                               DATE:                                         05-07-23 US/ estim combo   1.5 w/cm2 x 12 mins to LT LB/ SIJ in sitting Manual: STW to LT side LB paras and SIJ in sitting x 12 mins HMP and IFC at 80-150 Hz on 40%  scan x 15 minutes to patient's left low back.  Normal modality response following removal of modality.  PATIENT EDUCATION:    HOME EXERCISE PROGRAM:  ASSESSMENT:  CLINICAL IMPRESSION: The patient arrived today doing fair with LB pain, but reports LT LBP increases when walking. Rx focused on LT sided LBP reduction with [place] combo as well as STW and IFC. Pt tolerated seated position for Rx due to increased pain in side lying OBJECTIVE IMPAIRMENTS: Abnormal gait, decreased activity tolerance, decreased strength, increased muscle spasms, postural dysfunction, and pain.   ACTIVITY LIMITATIONS: standing and locomotion level  PARTICIPATION LIMITATIONS: meal prep, cleaning, and laundry  PERSONAL FACTORS: Time since onset of injury/illness/exacerbation and 1 comorbidity: left hip fracture and surgery  are also affecting patient's functional outcome.   REHAB POTENTIAL: Good  CLINICAL DECISION MAKING: Stable/uncomplicated  EVALUATION COMPLEXITY: Low   GOALS:  LONG TERM GOALS: Target date: 06/11/23  Ind with a HEP.  Goal status: INITIAL  2.  Stand 20 minutes with pain not > 3/10.  Goal status: INITIAL  3.  Walk a community distance with pain not > 3-4/10.  Goal status: INITIAL  4.  Eliminate left LE symptoms.  Goal status: INITIAL  PLAN:  PT FREQUENCY: 1x/week (patient request).  PT DURATION: 6 weeks  PLANNED INTERVENTIONS: Therapeutic exercises, Therapeutic activity, Gait training, Patient/Family education, Self Care, Dry Needling, Electrical stimulation, Cryotherapy, Moist heat, Ultrasound, and Manual therapy.  PLAN FOR NEXT SESSION: Modalities and STW/M as needed.  Hip bridges.   Low-level core exercises.   Deshaun Schou,CHRIS, PTA 05/07/2023, 2:44 PM

## 2023-05-14 ENCOUNTER — Encounter: Payer: Self-pay | Admitting: *Deleted

## 2023-05-14 ENCOUNTER — Ambulatory Visit: Payer: Medicare Other | Admitting: *Deleted

## 2023-05-14 DIAGNOSIS — R293 Abnormal posture: Secondary | ICD-10-CM | POA: Diagnosis not present

## 2023-05-14 DIAGNOSIS — M5416 Radiculopathy, lumbar region: Secondary | ICD-10-CM | POA: Diagnosis not present

## 2023-05-14 DIAGNOSIS — M62830 Muscle spasm of back: Secondary | ICD-10-CM | POA: Diagnosis not present

## 2023-05-14 DIAGNOSIS — M5459 Other low back pain: Secondary | ICD-10-CM | POA: Diagnosis not present

## 2023-05-14 NOTE — Therapy (Signed)
OUTPATIENT PHYSICAL THERAPY THORACOLUMBAR TREATMENT   Patient Name: Katie [name] MRN: [id number] DOB:[birth year], 89 y.o., female Today's Date: 05/14/2023  END OF SESSION:  PT End of Session - 05/14/23 1425     Visit Number 3    Number of Visits 6    Date for PT Re-Evaluation 06/11/23    Authorization Type FOTO    PT Start Time 1430    PT Stop Time 1520    PT Time Calculation (min) 50 min             Past Medical History:  Diagnosis Date   Anxiety    Aortic insufficiency    a. Trivial AI by echo 02/2016.   Atrial fibrillation and flutter (HCC)    a. Coarse afib vs flutter by EKG 12/2015.   Cancer (HCC)    skin cancer   Cataract    Chronic diastolic CHF (congestive heart failure) (HCC)    CKD (chronic kidney disease), stage III (HCC)    COVID-19    GERD (gastroesophageal reflux disease)    Hiatal hernia    Hypercholesterolemia    Hypertension    Hypokalemia    Hypothyroidism    Left knee pain    NIDDM (non-insulin dependent diabetes mellitus)    diet controlled    Osteoporosis    Premature atrial contractions    PVC's (premature ventricular contractions)    Vitamin D deficiency    Past Surgical History:  Procedure Laterality Date   A-FLUTTER ABLATION N/A 02/08/2019   Procedure: A-FLUTTER ABLATION;  Surgeon: Hillis Range, MD;  Location: [facility];  Service: Cardiovascular;  Laterality: N/A;   ABDOMINAL HYSTERECTOMY     APPENDECTOMY  1980   BACK SURGERY     CATARACT EXTRACTION, BILATERAL     CHOLECYSTECTOMY  5/00   COLONOSCOPY     implantable loop recorder placement  04/06/2019   MDT Reveal LINQ [id number] ([id number]) implanted for evaluation of palpitations and afib post atrial flutter ablation by Dr Johney Frame in office   INTRAMEDULLARY (IM) NAIL INTERTROCHANTERIC Left 09/23/2019   Procedure: INTRAMEDULLARY (IM) NAIL INTERTROCHANTRIC;  Surgeon: Roby Lofts, MD;  Location: [facility];  Service: Orthopedics;  Laterality: Left;   TONSILLECTOMY     TOTAL  ABDOMINAL HYSTERECTOMY W/ BILATERAL SALPINGOOPHORECTOMY  1980   UPPER GASTROINTESTINAL ENDOSCOPY     Patient Active Problem List   Diagnosis Date Noted   Spinal stenosis of lumbar region 04/22/2023   Breakage of internal fixation device in bone (HCC) 11/22/2019   Displaced intertrochanteric fracture of left femur, initial encounter for closed fracture (HCC) 09/22/2019   Hypomagnesemia 08/12/2019   Regurgitation of food 07/20/2019   Chronic midline low back pain without sciatica 10/22/2018   Primary insomnia 10/22/2018   Persistent atrial fibrillation (HCC) 04/27/2017   Chronic diastolic CHF (congestive heart failure) (HCC) 04/27/2017   CKD (chronic kidney disease) stage 3, GFR 30-59 ml/min (HCC) 05/14/2015   BMI 26.0-26.9,adult 05/11/2015   Peripheral edema 07/06/2014   Hypokalemia 07/22/2013   Hypothyroidism 07/22/2013   Osteopenia, senile 03/09/2013   Constipation 01/04/2013   Hypertension 05/07/2012   Hyperlipidemia 05/07/2012   Diabetes mellitus type 2, diet-controlled (HCC) 05/07/2012   GERD (gastroesophageal reflux disease) 05/07/2012      REFERRING PROVIDER: Annell Greening MD  REFERRING DIAG: Lumbar radiculopathy  Rationale for Evaluation and Treatment: Rehabilitation  THERAPY DIAG:  Other low back pain  Muscle spasm of back  Abnormal posture  ONSET DATE: 2020.  SUBJECTIVE:  SUBJECTIVE STATEMENT: The patient states felt better after last Rx   PERTINENT HISTORY:  OP, left hip ORIF.  PAIN:  Are you having pain? Yes: NPRS scale: 6-7/10 Pain location: Left low back. Pain description: Ache, hurt. Aggravating factors: As above. Relieving factors: As above.  PRECAUTIONS: Fall  RED FLAGS: None   WEIGHT BEARING RESTRICTIONS: No  FALLS:  Has patient fallen in last 6 months?  Yes. Number of falls 1.  LIVING ENVIRONMENT: Lives in: House/apartment Stairs: No Has following equipment at home: Dan Humphreys - 2 wheeled  OCCUPATION: Retired.  PLOF: Independent with basic ADLs and Independent with household mobility with device  PATIENT GOALS: Decrease pain especially when walking.    OBJECTIVE:   DIAGNOSTIC FINDINGS:   IMPRESSION:  05/18/19. No acute abnormality.  Remote T12 compression fracture is unchanged.   Spondylosis worst at L4-5 where there is moderately severe to severe central canal stenosis and marked narrowing in the right subarticular recess and foramen with encroachment on the exiting right L4 and descending right L5 roots.   Moderately severe central canal stenosis and right worse than left subarticular recess narrowing at L3-4.   Moderate narrowing in the left subarticular recess and foramen at L2-3 where there is mild to moderate central canal stenosis overall.  PATIENT SURVEYS:  FOTO 55.73.     POSTURE: rounded shoulders, forward head, decreased lumbar lordosis, and flexed trunk , genu valgum right > left.  PALPATION: Tender to palpation over left lower lumbar region and especially the left SIJ.  LOWER EXTREMITY ROM:   WFL, assessed in supine.    LOWER EXTREMITY MMT:    Left hip flexion and abduction is 4/5.  Bilateral knee and ankle strength is normal.  LUMBAR SPECIAL TESTS:  No pain increase with SLR testing.   GAIT: The patient walks safely (in some lumbar flexion) with a FWW.  TODAY'S TREATMENT:                                                                                                                              DATE:                                         05-14-23 US/ estim combo   1.5 w/cm2 x 12 mins to LT LB/ SIJ in sitting Manual: STW to LT side LB paras and SIJ in sitting x 12 mins HMP and IFC at 80-150 Hz on 40% scan x 15 minutes to patient's left low back.  Normal modality response following removal of  modality.  PATIENT EDUCATION:    HOME EXERCISE PROGRAM:  ASSESSMENT:  CLINICAL IMPRESSION: FOTO next Rx The patient arrived today doing fair with LB pain and  reports some decreased LBP after last Rx. Rx focused again on LT sided LBP reduction with [place] combo , STW and IFC perfomed with Pt sitting. Pt  tolerated seated position for Rx due to increased pain in side lying   OBJECTIVE IMPAIRMENTS: Abnormal gait, decreased activity tolerance, decreased strength, increased muscle spasms, postural dysfunction, and pain.   ACTIVITY LIMITATIONS: standing and locomotion level  PARTICIPATION LIMITATIONS: meal prep, cleaning, and laundry  PERSONAL FACTORS: Time since onset of injury/illness/exacerbation and 1 comorbidity: left hip fracture and surgery  are also affecting patient's functional outcome.   REHAB POTENTIAL: Good  CLINICAL DECISION MAKING: Stable/uncomplicated  EVALUATION COMPLEXITY: Low   GOALS:  LONG TERM GOALS: Target date: 06/11/23  Ind with a HEP.  Goal status: On going  2.  Stand 20 minutes with pain not > 3/10.  Goal status:  On going  3.  Walk a community distance with pain not > 3-4/10.  Goal status:  On going  4.  Eliminate left LE symptoms.  Goal status: On going  PLAN:  PT FREQUENCY: 1x/week (patient request).  PT DURATION: 6 weeks  PLANNED INTERVENTIONS: Therapeutic exercises, Therapeutic activity, Gait training, Patient/Family education, Self Care, Dry Needling, Electrical stimulation, Cryotherapy, Moist heat, Ultrasound, and Manual therapy.  PLAN FOR NEXT SESSION: Modalities and STW/M as needed.  Hip bridges.  Low-level core exercises.   Lasheba Stevens,CHRIS, PTA 05/14/2023, 5:57 PM

## 2023-05-19 ENCOUNTER — Encounter: Payer: Self-pay | Admitting: Nurse Practitioner

## 2023-05-19 ENCOUNTER — Ambulatory Visit (INDEPENDENT_AMBULATORY_CARE_PROVIDER_SITE_OTHER): Payer: Medicare Other | Admitting: Nurse Practitioner

## 2023-05-19 VITALS — BP 148/72 | HR 66 | Temp 98.0°F | Resp 20 | Ht 66.0 in | Wt 154.0 lb

## 2023-05-19 DIAGNOSIS — K219 Gastro-esophageal reflux disease without esophagitis: Secondary | ICD-10-CM

## 2023-05-19 DIAGNOSIS — E1169 Type 2 diabetes mellitus with other specified complication: Secondary | ICD-10-CM | POA: Diagnosis not present

## 2023-05-19 DIAGNOSIS — F5101 Primary insomnia: Secondary | ICD-10-CM | POA: Diagnosis not present

## 2023-05-19 DIAGNOSIS — N1832 Chronic kidney disease, stage 3b: Secondary | ICD-10-CM

## 2023-05-19 DIAGNOSIS — E876 Hypokalemia: Secondary | ICD-10-CM

## 2023-05-19 DIAGNOSIS — E039 Hypothyroidism, unspecified: Secondary | ICD-10-CM | POA: Diagnosis not present

## 2023-05-19 DIAGNOSIS — E119 Type 2 diabetes mellitus without complications: Secondary | ICD-10-CM

## 2023-05-19 DIAGNOSIS — I130 Hypertensive heart and chronic kidney disease with heart failure and stage 1 through stage 4 chronic kidney disease, or unspecified chronic kidney disease: Secondary | ICD-10-CM

## 2023-05-19 DIAGNOSIS — R600 Localized edema: Secondary | ICD-10-CM

## 2023-05-19 DIAGNOSIS — I5032 Chronic diastolic (congestive) heart failure: Secondary | ICD-10-CM | POA: Diagnosis not present

## 2023-05-19 DIAGNOSIS — M545 Low back pain, unspecified: Secondary | ICD-10-CM

## 2023-05-19 DIAGNOSIS — I4819 Other persistent atrial fibrillation: Secondary | ICD-10-CM

## 2023-05-19 DIAGNOSIS — G8929 Other chronic pain: Secondary | ICD-10-CM | POA: Diagnosis not present

## 2023-05-19 DIAGNOSIS — I10 Essential (primary) hypertension: Secondary | ICD-10-CM

## 2023-05-19 MED ORDER — TRAMADOL HCL 50 MG PO TABS: 50.0000 mg | ORAL_TABLET | Freq: Three times a day (TID) | ORAL | 2 refills | Status: DC | PRN

## 2023-05-19 MED ORDER — RIVAROXABAN 15 MG PO TABS
15.0000 mg | ORAL_TABLET | Freq: Every day | ORAL | 1 refills | Status: DC
Start: 2023-05-19 — End: 2023-11-17

## 2023-05-19 MED ORDER — POTASSIUM CHLORIDE CRYS ER 20 MEQ PO TBCR
20.0000 meq | EXTENDED_RELEASE_TABLET | Freq: Every day | ORAL | 1 refills | Status: DC
Start: 2023-05-19 — End: 2023-11-17

## 2023-05-19 MED ORDER — PREDNISONE 20 MG PO TABS
ORAL_TABLET | ORAL | 0 refills | Status: DC
Start: 2023-05-19 — End: 2023-05-28

## 2023-05-19 MED ORDER — BENAZEPRIL HCL 40 MG PO TABS: 40.0000 mg | ORAL_TABLET | Freq: Every day | ORAL | 1 refills | Status: DC

## 2023-05-19 MED ORDER — FUROSEMIDE 40 MG PO TABS
40.0000 mg | ORAL_TABLET | Freq: Every day | ORAL | 1 refills | Status: DC
Start: 2023-05-19 — End: 2023-11-17

## 2023-05-19 MED ORDER — LOVASTATIN 40 MG PO TABS
40.0000 mg | ORAL_TABLET | Freq: Every day | ORAL | 1 refills | Status: DC
Start: 2023-05-19 — End: 2023-11-17

## 2023-05-19 MED ORDER — LEVOTHYROXINE SODIUM 75 MCG PO TABS
75.0000 ug | ORAL_TABLET | Freq: Every day | ORAL | 1 refills | Status: DC
Start: 2023-05-19 — End: 2023-11-17

## 2023-05-19 MED ORDER — METHYLPREDNISOLONE ACETATE 80 MG/ML IJ SUSP
80.0000 mg | Freq: Once | INTRAMUSCULAR | Status: AC
Start: 2023-05-19 — End: 2023-05-19
  Administered 2023-05-19: 80 mg via INTRAMUSCULAR

## 2023-05-19 MED ORDER — PANTOPRAZOLE SODIUM 40 MG PO TBEC
40.0000 mg | DELAYED_RELEASE_TABLET | Freq: Every day | ORAL | 1 refills | Status: DC
Start: 2023-05-19 — End: 2023-11-17

## 2023-05-19 MED ORDER — FAMOTIDINE 20 MG PO TABS: 20.0000 mg | ORAL_TABLET | Freq: Every day | ORAL | 1 refills | Status: DC

## 2023-05-19 NOTE — Patient Instructions (Signed)
Acute Back Pain, Adult Acute back pain is sudden and usually short-lived. It is often caused by an injury to the muscles and tissues in the back. The injury may result from: A muscle, tendon, or ligament getting overstretched or torn. Ligaments are tissues that connect bones to each other. Lifting something improperly can cause a back strain. Wear and tear (degeneration) of the spinal disks. Spinal disks are circular tissue that provide cushioning between the bones of the spine (vertebrae). Twisting motions, such as while playing sports or doing yard work. A hit to the back. Arthritis. You may have a physical exam, lab tests, and imaging tests to find the cause of your pain. Acute back pain usually goes away with rest and home care. Follow these instructions at home: Managing pain, stiffness, and swelling Take over-the-counter and prescription medicines only as told by your health care provider. Treatment may include medicines for pain and inflammation that are taken by mouth or applied to the skin, or muscle relaxants. Your health care provider may recommend applying ice during the first 24-48 hours after your pain starts. To do this: Put ice in a plastic bag. Place a towel between your skin and the bag. Leave the ice on for 20 minutes, 2-3 times a day. Remove the ice if your skin turns bright red. This is very important. If you cannot feel pain, heat, or cold, you have a greater risk of damage to the area. If directed, apply heat to the affected area as often as told by your health care provider. Use the heat source that your health care provider recommends, such as a moist heat pack or a heating pad. Place a towel between your skin and the heat source. Leave the heat on for 20-30 minutes. Remove the heat if your skin turns bright red. This is especially important if you are unable to feel pain, heat, or cold. You have a greater risk of getting burned. Activity  Do not stay in bed. Staying in  bed for more than 1-2 days can delay your recovery. Sit up and stand up straight. Avoid leaning forward when you sit or hunching over when you stand. If you work at a desk, sit close to it so you do not need to lean over. Keep your chin tucked in. Keep your neck drawn back, and keep your elbows bent at a 90-degree angle (right angle). Sit high and close to the steering wheel when you drive. Add lower back (lumbar) support to your car seat, if needed. Take short walks on even surfaces as soon as you are able. Try to increase the length of time you walk each day. Do not sit, drive, or stand in one place for more than 30 minutes at a time. Sitting or standing for long periods of time can put stress on your back. Do not drive or use heavy machinery while taking prescription pain medicine. Use proper lifting techniques. When you bend and lift, use positions that put less stress on your back: Bend your knees. Keep the load close to your body. Avoid twisting. Exercise regularly as told by your health care provider. Exercising helps your back heal faster and helps prevent back injuries by keeping muscles strong and flexible. Work with a physical therapist to make a safe exercise program, as recommended by your health care provider. Do any exercises as told by your physical therapist. Lifestyle Maintain a healthy weight. Extra weight puts stress on your back and makes it difficult to have good   posture. Avoid activities or situations that make you feel anxious or stressed. Stress and anxiety increase muscle tension and can make back pain worse. Learn ways to manage anxiety and stress, such as through exercise. General instructions Sleep on a firm mattress in a comfortable position. Try lying on your side with your knees slightly bent. If you lie on your back, put a pillow under your knees. Keep your head and neck in a straight line with your spine (neutral position) when using electronic equipment like  smartphones or pads. To do this: Raise your smartphone or pad to look at it instead of bending your head or neck to look down. Put the smartphone or pad at the level of your face while looking at the screen. Follow your treatment plan as told by your health care provider. This may include: Cognitive or behavioral therapy. Acupuncture or massage therapy. Meditation or yoga. Contact a health care provider if: You have pain that is not relieved with rest or medicine. You have increasing pain going down into your legs or buttocks. Your pain does not improve after 2 weeks. You have pain at night. You lose weight without trying. You have a fever or chills. You develop nausea or vomiting. You develop abdominal pain. Get help right away if: You develop new bowel or bladder control problems. You have unusual weakness or numbness in your arms or legs. You feel faint. These symptoms may represent a serious problem that is an emergency. Do not wait to see if the symptoms will go away. Get medical help right away. Call your local emergency services (911 in the [place] Do not drive yourself to the hospital. Summary Acute back pain is sudden and usually short-lived. Use proper lifting techniques. When you bend and lift, use positions that put less stress on your back. Take over-the-counter and prescription medicines only as told by your health care provider, and apply heat or ice as told. This information is not intended to replace advice given to you by your health care provider. Make sure you discuss any questions you have with your health care provider. Document Revised: 10/26/2020 Document Reviewed: 10/26/2020 Elsevier Patient Education  2024 [employer]  

## 2023-05-19 NOTE — Progress Notes (Signed)
Subjective:    Patient ID: Katie [name], female    DOB: [birth year], 89 y.o.   MRN: [id number]   Chief Complaint: medical management of chronic issues     HPI:  Katie [name] is a 89 y.o. who identifies as a female who was assigned female at birth.   Social history: Lives with: by herself- family checks on her daily Work history: retired   Water engineer in today for follow up of the following chronic medical issues:  1. Primary hypertension No c/o chest pain, sob or headache. Does not check blood pressure at home. BP Readings from Last 3 Encounters:  04/21/23 (!) 151/81  03/02/23 110/68  01/06/23 (!) 112/58     2. Chronic diastolic CHF (congestive heart failure) (HCC) 3. Persistent atrial fibrillation (HCC) Is on xeralto with no bleeding issues. Denies palpitations or feeling that heart racing. Last saw cardiology on 12/30/22. No changes made to plan of care.  4. Gastroesophageal reflux disease without esophagitis Is on combination of pepcid and protonix- works well for her.  5. Diabetes mellitus type 2, diet-controlled (HCC) She does not check her blood sugars at home. Lab Results  Component Value Date   HGBA1C 6.7 (H) 01/06/2023     6. Acquired hypothyroidism No issues that she is aware of. Lab Results  Component Value Date   TSH 3.080 10/06/2022     7. Stage 3b chronic kidney disease (HCC) No voiding issues Lab Results  Component Value Date   CREATININE 1.22 (H) 01/06/2023     8. Hyperlipidemia associated with type 2 diabetes mellitus (HCC) Watches diet but does no exercise. Lab Results  Component Value Date   CHOL 149 01/06/2023   HDL 74 01/06/2023   LDLCALC 59 01/06/2023   TRIG 82 01/06/2023   CHOLHDL 2.0 01/06/2023     9. Hypokalemia No muscle cramping Lab Results  Component Value Date   K 4.5 01/06/2023     10. Hypomagnesemia Last magnesium level was 2.1   11. Peripheral edema Has some daily by the end of the day  12. Primary  insomnia Is currently on no sleep aids  13. Chronic back pain Pain assessment: She has been out of pain meds and s really hurting right ow. Would like steroids. Cause of pain- radiculopathy Pain location- lower back --Pain on scale of 1-10- 7-8/10 Frequency- daily What increases pain-nothing really What makes pain Better-is currently getting physical therapy Effects on ADL - none Any change in general medical condition-no  Current opioids rx- ultramTID # meds rx- 90 Effectiveness of current meds-helps Adverse reactions from pain meds-none Morphine equivalent- 15 MME  Pill count performed-No Last drug screen - 01/29/22 ( high risk q45m, moderate risk q58m, low risk yearly ) Urine drug screen today- Yes Was the [facility] reviewed- yes  If yes were their any concerning findings? - no   Overdose risk: 1   Pain contract signed on: 01/31/22  New complaints: None today  Allergies  Allergen Reactions   Shellfish Allergy Other (See Comments)   Amlodipine Swelling   Macrodantin Nausea And Vomiting   Metformin And Related Nausea And Vomiting and Other (See Comments)    Bloating   Penicillins Rash    Did it involve swelling of the face/tongue/throat, SOB, or low BP? Unknown Did it involve sudden or severe rash/hives, skin peeling, or any reaction on the inside of your mouth or nose? Yes Did you need to seek medical attention at a hospital or doctor's office? Yes  When did it last happen? 2005  If all above answers are "NO", may proceed with cephalosporin use.    Outpatient Encounter Medications as of 05/19/2023  Medication Sig   Alum & Mag Hydroxide-Simeth (GI COCKTAIL) SUSP suspension Take 30 mLs by mouth at bedtime. Shake well.   benazepril (LOTENSIN) 40 MG tablet Take 1 tablet (40 mg total) by mouth at bedtime.   Cholecalciferol (VITAMIN D-3) 125 MCG (5000 UT) TABS Take 5,000 Units by mouth daily.   cloNIDine (CATAPRES) 0.1 MG tablet TAKE ONE TABLET TWICE DAILY   famotidine  (PEPCID) 20 MG tablet Take 1 tablet (20 mg total) by mouth at bedtime.   feeding supplement, ENSURE ENLIVE, (ENSURE ENLIVE) LIQD Take 237 mLs by mouth 2 (two) times daily between meals.   fluticasone (FLONASE) 50 MCG/ACT nasal spray USE 2 SPRAYS IN EACH NOSTRIL ONCE DAILY.   furosemide (LASIX) 40 MG tablet Take 1 tablet (40 mg total) by mouth daily.   hydrALAZINE (APRESOLINE) 10 MG tablet Take 1 tablet (10 mg total) by mouth in the morning and at bedtime.   levothyroxine (SYNTHROID) 75 MCG tablet Take 1 tablet (75 mcg total) by mouth daily.   lovastatin (MEVACOR) 40 MG tablet Take 1 tablet (40 mg total) by mouth at bedtime.   Multiple Vitamin (MULTIVITAMIN WITH MINERALS) TABS tablet Take 1 tablet by mouth daily.   pantoprazole (PROTONIX) 40 MG tablet Take 1 tablet (40 mg total) by mouth daily.   potassium chloride SA (KLOR-CON M) 20 MEQ tablet Take 1 tablet (20 mEq total) by mouth daily.   raloxifene (EVISTA) 60 MG tablet Take 1 tablet (60 mg total) by mouth daily.   Rivaroxaban (XARELTO) 15 MG TABS tablet Take 1 tablet (15 mg total) by mouth daily with supper.   traMADol (ULTRAM) 50 MG tablet Take 1 tablet (50 mg total) by mouth 3 (three) times daily as needed.   No facility-administered encounter medications on file as of 05/19/2023.    Past Surgical History:  Procedure Laterality Date   A-FLUTTER ABLATION N/A 02/08/2019   Procedure: A-FLUTTER ABLATION;  Surgeon: Hillis Range, MD;  Location: [facility];  Service: Cardiovascular;  Laterality: N/A;   ABDOMINAL HYSTERECTOMY     APPENDECTOMY  1980   BACK SURGERY     CATARACT EXTRACTION, BILATERAL     CHOLECYSTECTOMY  5/00   COLONOSCOPY     implantable loop recorder placement  04/06/2019   MDT Reveal LINQ [id number] ([id number]) implanted for evaluation of palpitations and afib post atrial flutter ablation by Dr Johney Frame in office   INTRAMEDULLARY (IM) NAIL INTERTROCHANTERIC Left 09/23/2019   Procedure: INTRAMEDULLARY (IM) NAIL  INTERTROCHANTRIC;  Surgeon: Roby Lofts, MD;  Location: [facility] OR;  Service: Orthopedics;  Laterality: Left;   TONSILLECTOMY     TOTAL ABDOMINAL HYSTERECTOMY W/ BILATERAL SALPINGOOPHORECTOMY  1980   UPPER GASTROINTESTINAL ENDOSCOPY      Family History  Problem Relation Age of Onset   Diabetes Mother    Stroke Mother    Heart disease Father    Stroke Father    Uterine cancer Sister    Diabetes Sister    Ovarian cancer Sister    Colon cancer Sister    Diabetes Sister    Liver cancer Sister        \   Atrial fibrillation Sister    Diabetes Sister    Diabetes Brother    Dementia Brother    Diabetes Son    Healthy Son    Heart attack  Neg Hx    Hypertension Neg Hx    Esophageal cancer Neg Hx    Rectal cancer Neg Hx    Stomach cancer Neg Hx    Breast cancer Neg Hx       Controlled substance contract: n/a     Review of Systems  Constitutional:  Negative for diaphoresis.  Eyes:  Negative for pain.  Respiratory:  Negative for shortness of breath.   Cardiovascular:  Negative for chest pain, palpitations and leg swelling.  Gastrointestinal:  Negative for abdominal pain.  Endocrine: Negative for polydipsia.  Skin:  Negative for rash.  Neurological:  Negative for dizziness, weakness and headaches.  Hematological:  Does not bruise/bleed easily.  All other systems reviewed and are negative.      Objective:   Physical Exam Vitals and nursing note reviewed.  Constitutional:      General: She is not in acute distress.    Appearance: Normal appearance. She is well-developed.  HENT:     Head: Normocephalic.     Right Ear: Tympanic membrane normal.     Left Ear: Tympanic membrane normal.     Nose: Nose normal.     Mouth/Throat:     Mouth: Mucous membranes are moist.  Eyes:     Pupils: Pupils are equal, round, and reactive to light.  Neck:     Vascular: No carotid bruit or JVD.  Cardiovascular:     Rate and Rhythm: Normal rate and regular rhythm.     Heart sounds:  Normal heart sounds.  Pulmonary:     Effort: Pulmonary effort is normal. No respiratory distress.     Breath sounds: Normal breath sounds. No wheezing or rales.  Chest:     Chest wall: No tenderness.  Abdominal:     General: Bowel sounds are normal. There is no distension or abdominal bruit.     Palpations: Abdomen is soft. There is no hepatomegaly, splenomegaly, mass or pulsatile mass.     Tenderness: There is no abdominal tenderness.  Musculoskeletal:        General: Normal range of motion.     Cervical back: Normal range of motion and neck supple.     Comments: Rises slowly from sitting to standing Pain mainly when walking (-) SLR bil  Lymphadenopathy:     Cervical: No cervical adenopathy.  Skin:    General: Skin is warm and dry.  Neurological:     Mental Status: She is alert and oriented to person, place, and time.     Deep Tendon Reflexes: Reflexes are normal and symmetric.  Psychiatric:        Behavior: Behavior normal.        Thought Content: Thought content normal.        Judgment: Judgment normal.    BP (!) 148/72   Pulse 66   Temp 98 F (36.7 C) (Temporal)   Resp 20   Ht 5\' 6"  (1.676 m)   Wt 154 lb (69.9 kg)   SpO2 97%   BMI 24.86 kg/m          Assessment & Plan:   Katie [name] comes in today with chief complaint of Medical Management of Chronic Issues   Diagnosis and orders addressed:  1. Primary hypertension Low sodium diet - CBC with Differential/Platelet - CMP14+EGFR - benazepril (LOTENSIN) 40 MG tablet; Take 1 tablet (40 mg total) by mouth at bedtime.  Dispense: 90 tablet; Refill: 1  2. Chronic diastolic CHF (congestive heart failure) (HCC) 3.  Persistent atrial fibrillation (HCC) Keep follow up with cardiology - Rivaroxaban (XARELTO) 15 MG TABS tablet; Take 1 tablet (15 mg total) by mouth daily with supper.  Dispense: 90 tablet; Refill: 1  4. Gastroesophageal reflux disease without esophagitis Avoid spicy foods Do not eat 2 hours prior  to bedtime - famotidine (PEPCID) 20 MG tablet; Take 1 tablet (20 mg total) by mouth at bedtime.  Dispense: 90 tablet; Refill: 1 - pantoprazole (PROTONIX) 40 MG tablet; Take 1 tablet (40 mg total) by mouth daily.  Dispense: 90 tablet; Refill: 1  5. Diabetes mellitus type 2, diet-controlled (HCC) Continue to watch carbs  6. Acquired hypothyroidism Labs pending - Thyroid Panel With TSH - levothyroxine (SYNTHROID) 75 MCG tablet; Take 1 tablet (75 mcg total) by mouth daily.  Dispense: 90 tablet; Refill: 1  7. Stage 3b chronic kidney disease (HCC) Labs pending  8. Hyperlipidemia associated with type 2 diabetes mellitus (HCC) Low fat diet - Lipid panel - lovastatin (MEVACOR) 40 MG tablet; Take 1 tablet (40 mg total) by mouth at bedtime.  Dispense: 90 tablet; Refill: 1  9. Hypokalemia Labs pending - potassium chloride SA (KLOR-CON M) 20 MEQ tablet; Take 1 tablet (20 mEq total) by mouth daily.  Dispense: 90 tablet; Refill: 1  10. Hypomagnesemia Labs pending  11. Peripheral edema Elevate legs when siting - furosemide (LASIX) 40 MG tablet; Take 1 tablet (40 mg total) by mouth daily.  Dispense: 90 tablet; Refill: 1  12. Primary insomnia Bedtime routine  13. Chronic midline low back pain without sciatica Moist heat Continue physical therapy - traMADol (ULTRAM) 50 MG tablet; Take 1 tablet (50 mg total) by mouth 3 (three) times daily as needed.  Dispense: 90 tablet; Refill: 2 - ToxASSURE Select 13 (MW), Urine - ToxASSURE Select 13 (MW), Urine - methylPREDNISolone acetate (DEPO-MEDROL) injection 80 mg - predniSONE (DELTASONE) 20 MG tablet; 2 po at sametime daily for 5 days-  Dispense: 10 tablet; Refill: 0   Labs pending Health Maintenance reviewed Diet and exercise encouraged  Follow up plan: 6 months   Mary-Margaret Daphine Deutscher, FNP

## 2023-05-20 ENCOUNTER — Ambulatory Visit: Payer: Medicare Other | Admitting: Cardiology

## 2023-05-21 ENCOUNTER — Ambulatory Visit: Payer: Medicare Other | Attending: Orthopaedic Surgery | Admitting: Physical Therapy

## 2023-05-21 DIAGNOSIS — M62830 Muscle spasm of back: Secondary | ICD-10-CM | POA: Diagnosis not present

## 2023-05-21 DIAGNOSIS — R293 Abnormal posture: Secondary | ICD-10-CM | POA: Insufficient documentation

## 2023-05-21 DIAGNOSIS — M5459 Other low back pain: Secondary | ICD-10-CM | POA: Diagnosis not present

## 2023-05-21 NOTE — Therapy (Signed)
OUTPATIENT PHYSICAL THERAPY THORACOLUMBAR TREATMENT   Patient Name: Katie [name] MRN: [id number] DOB:06/05/1932, 89 y.o., female Today's Date: 05/21/2023  END OF SESSION:  PT End of Session - 05/21/23 1611     Visit Number 4    Number of Visits 6    Date for PT Re-Evaluation 06/11/23    Authorization Type FOTO    PT Start Time 0315    PT Stop Time 0408    PT Time Calculation (min) 53 min    Activity Tolerance Patient tolerated treatment well    Behavior During Therapy [facility] for tasks assessed/performed             Past Medical History:  Diagnosis Date   Anxiety    Aortic insufficiency    a. Trivial AI by echo 02/2016.   Atrial fibrillation and flutter (HCC)    a. Coarse afib vs flutter by EKG 12/2015.   Cancer (HCC)    skin cancer   Cataract    Chronic diastolic CHF (congestive heart failure) (HCC)    CKD (chronic kidney disease), stage III (HCC)    COVID-19    GERD (gastroesophageal reflux disease)    Hiatal hernia    Hypercholesterolemia    Hypertension    Hypokalemia    Hypothyroidism    Left knee pain    NIDDM (non-insulin dependent diabetes mellitus)    diet controlled    Osteoporosis    Premature atrial contractions    PVC's (premature ventricular contractions)    Vitamin D deficiency    Past Surgical History:  Procedure Laterality Date   A-FLUTTER ABLATION N/A 02/08/2019   Procedure: A-FLUTTER ABLATION;  Surgeon: Hillis Range, MD;  Location: [facility];  Service: Cardiovascular;  Laterality: N/A;   ABDOMINAL HYSTERECTOMY     APPENDECTOMY  1980   BACK SURGERY     CATARACT EXTRACTION, BILATERAL     CHOLECYSTECTOMY  5/00   COLONOSCOPY     implantable loop recorder placement  04/06/2019   MDT Reveal LINQ [id number] ([id number]) implanted for evaluation of palpitations and afib post atrial flutter ablation by Dr Johney Frame in office   INTRAMEDULLARY (IM) NAIL INTERTROCHANTERIC Left 09/23/2019   Procedure: INTRAMEDULLARY (IM) NAIL INTERTROCHANTRIC;   Surgeon: Roby Lofts, MD;  Location: [facility] OR;  Service: Orthopedics;  Laterality: Left;   TONSILLECTOMY     TOTAL ABDOMINAL HYSTERECTOMY W/ BILATERAL SALPINGOOPHORECTOMY  1980   UPPER GASTROINTESTINAL ENDOSCOPY     Patient Active Problem List   Diagnosis Date Noted   Spinal stenosis of lumbar region 04/22/2023   Breakage of internal fixation device in bone (HCC) 11/22/2019   Displaced intertrochanteric fracture of left femur, initial encounter for closed fracture (HCC) 09/22/2019   Hypomagnesemia 08/12/2019   Regurgitation of food 07/20/2019   Chronic midline low back pain without sciatica 10/22/2018   Primary insomnia 10/22/2018   Persistent atrial fibrillation (HCC) 04/27/2017   Chronic diastolic CHF (congestive heart failure) (HCC) 04/27/2017   CKD (chronic kidney disease) stage 3, GFR 30-59 ml/min (HCC) 05/14/2015   BMI 26.0-26.9,adult 05/11/2015   Peripheral edema 07/06/2014   Hypokalemia 07/22/2013   Hypothyroidism 07/22/2013   Osteopenia, senile 03/09/2013   Constipation 01/04/2013   Hypertension 05/07/2012   Hyperlipidemia associated with type 2 diabetes mellitus (HCC) 05/07/2012   Diabetes mellitus type 2, diet-controlled (HCC) 05/07/2012   GERD (gastroesophageal reflux disease) 05/07/2012      REFERRING PROVIDER: Annell Greening MD  REFERRING DIAG: Lumbar radiculopathy  Rationale for Evaluation and Treatment: Rehabilitation  THERAPY DIAG:  No diagnosis found.  ONSET DATE: 2020.  SUBJECTIVE:                                                                                                                                                                                           SUBJECTIVE STATEMENT: Got an injection and put on steroids on 05/19/23.  Pain low today.  PERTINENT HISTORY:  OP, left hip ORIF.  PAIN:  Are you having pain? Yes: NPRS scale: 6-7/10 Pain location: Left low back. Pain description: Ache, hurt. Aggravating factors: As above. Relieving  factors: As above.  PRECAUTIONS: Fall  RED FLAGS: None   WEIGHT BEARING RESTRICTIONS: No  FALLS:  Has patient fallen in last 6 months? Yes. Number of falls 1.  LIVING ENVIRONMENT: Lives in: House/apartment Stairs: No Has following equipment at home: Dan Humphreys - 2 wheeled  OCCUPATION: Retired.  PLOF: Independent with basic ADLs and Independent with household mobility with device  PATIENT GOALS: Decrease pain especially when walking.    OBJECTIVE:   DIAGNOSTIC FINDINGS:   IMPRESSION:  05/18/19. No acute abnormality.  Remote T12 compression fracture is unchanged.   Spondylosis worst at L4-5 where there is moderately severe to severe central canal stenosis and marked narrowing in the right subarticular recess and foramen with encroachment on the exiting right L4 and descending right L5 roots.   Moderately severe central canal stenosis and right worse than left subarticular recess narrowing at L3-4.   Moderate narrowing in the left subarticular recess and foramen at L2-3 where there is mild to moderate central canal stenosis overall.  PATIENT SURVEYS:  FOTO 55.73.     POSTURE: rounded shoulders, forward head, decreased lumbar lordosis, and flexed trunk , genu valgum right > left.  PALPATION: Tender to palpation over left lower lumbar region and especially the left SIJ.  LOWER EXTREMITY ROM:   WFL, assessed in supine.    LOWER EXTREMITY MMT:    Left hip flexion and abduction is 4/5.  Bilateral knee and ankle strength is normal.  LUMBAR SPECIAL TESTS:  No pain increase with SLR testing.   GAIT: The patient walks safely (in some lumbar flexion) with a FWW.  TODAY'S TREATMENT:  DATE:     05/21/23:    US/ estim combo   1.5 w/cm2 x 12 mins to LT LB/ SIJ in sitting Manual: STW to LT side LB paras and SIJ in sitting x 11 mins HMP and  IFC at 80-150 Hz on 40% scan x 15 minutes to patient's left low back.  Normal modality response following removal of modality.                                         05-14-23 US/ estim combo   1.5 w/cm2 x 12 mins to LT LB/ SIJ in sitting Manual: STW to LT side LB paras and SIJ in sitting x 12 mins HMP and IFC at 80-150 Hz on 40% scan x 15 minutes to patient's left low back.  Normal modality response following removal of modality.  PATIENT EDUCATION:    HOME EXERCISE PROGRAM:  ASSESSMENT:  CLINICAL IMPRESSION: Patient with low pain upon presentation to the clinic today.  She attributes this to Prednisone.  She had no pain after treatment today.  OBJECTIVE IMPAIRMENTS: Abnormal gait, decreased activity tolerance, decreased strength, increased muscle spasms, postural dysfunction, and pain.   ACTIVITY LIMITATIONS: standing and locomotion level  PARTICIPATION LIMITATIONS: meal prep, cleaning, and laundry  PERSONAL FACTORS: Time since onset of injury/illness/exacerbation and 1 comorbidity: left hip fracture and surgery  are also affecting patient's functional outcome.   REHAB POTENTIAL: Good  CLINICAL DECISION MAKING: Stable/uncomplicated  EVALUATION COMPLEXITY: Low   GOALS:  LONG TERM GOALS: Target date: 06/11/23  Ind with a HEP.  Goal status: On going  2.  Stand 20 minutes with pain not > 3/10.  Goal status:  On going  3.  Walk a community distance with pain not > 3-4/10.  Goal status:  On going  4.  Eliminate left LE symptoms.  Goal status: On going  PLAN:  PT FREQUENCY: 1x/week (patient request).  PT DURATION: 6 weeks  PLANNED INTERVENTIONS: Therapeutic exercises, Therapeutic activity, Gait training, Patient/Family education, Self Care, Dry Needling, Electrical stimulation, Cryotherapy, Moist heat, Ultrasound, and Manual therapy.  PLAN FOR NEXT SESSION: Modalities and STW/M as needed.  Hip bridges.  Low-level core exercises.   Prisha Hiley, [place], PT 05/21/2023,  4:12 PM

## 2023-05-22 LAB — TOXASSURE SELECT 13 (MW), URINE

## 2023-05-28 ENCOUNTER — Encounter: Payer: Self-pay | Admitting: Family

## 2023-05-28 ENCOUNTER — Encounter: Payer: Self-pay | Admitting: *Deleted

## 2023-05-28 ENCOUNTER — Ambulatory Visit: Payer: Medicare Other | Admitting: Family

## 2023-05-28 ENCOUNTER — Ambulatory Visit: Payer: Medicare Other | Admitting: *Deleted

## 2023-05-28 VITALS — BP 129/67 | HR 67 | Temp 97.3°F | Ht 66.0 in | Wt 153.0 lb

## 2023-05-28 DIAGNOSIS — G8929 Other chronic pain: Secondary | ICD-10-CM

## 2023-05-28 DIAGNOSIS — M545 Low back pain, unspecified: Secondary | ICD-10-CM | POA: Diagnosis not present

## 2023-05-28 DIAGNOSIS — M25552 Pain in left hip: Secondary | ICD-10-CM | POA: Diagnosis not present

## 2023-05-28 DIAGNOSIS — M5459 Other low back pain: Secondary | ICD-10-CM | POA: Diagnosis not present

## 2023-05-28 DIAGNOSIS — R293 Abnormal posture: Secondary | ICD-10-CM | POA: Diagnosis not present

## 2023-05-28 DIAGNOSIS — M62830 Muscle spasm of back: Secondary | ICD-10-CM | POA: Diagnosis not present

## 2023-05-28 MED ORDER — PREDNISONE 20 MG PO TABS: ORAL_TABLET | ORAL | 0 refills | Status: DC

## 2023-05-28 NOTE — Therapy (Signed)
OUTPATIENT PHYSICAL THERAPY THORACOLUMBAR TREATMENT   Patient Name: Katie [name] MRN: [id number] DOB:01-27-1932, 89 y.o., female Today's Date: 05/28/2023  END OF SESSION:  PT End of Session - 05/28/23 1517     Visit Number 5    Number of Visits 6    Date for PT Re-Evaluation 06/11/23    Authorization Type FOTO    PT Start Time 1517    PT Stop Time 1603    PT Time Calculation (min) 46 min             Past Medical History:  Diagnosis Date   Anxiety    Aortic insufficiency    a. Trivial AI by echo 02/2016.   Atrial fibrillation and flutter (HCC)    a. Coarse afib vs flutter by EKG 12/2015.   Cancer (HCC)    skin cancer   Cataract    Chronic diastolic CHF (congestive heart failure) (HCC)    CKD (chronic kidney disease), stage III (HCC)    COVID-19    GERD (gastroesophageal reflux disease)    Hiatal hernia    Hypercholesterolemia    Hypertension    Hypokalemia    Hypothyroidism    Left knee pain    NIDDM (non-insulin dependent diabetes mellitus)    diet controlled    Osteoporosis    Premature atrial contractions    PVC's (premature ventricular contractions)    Vitamin D deficiency    Past Surgical History:  Procedure Laterality Date   A-FLUTTER ABLATION N/A 02/08/2019   Procedure: A-FLUTTER ABLATION;  Surgeon: Hillis Range, MD;  Location: [facility];  Service: Cardiovascular;  Laterality: N/A;   ABDOMINAL HYSTERECTOMY     APPENDECTOMY  1980   BACK SURGERY     CATARACT EXTRACTION, BILATERAL     CHOLECYSTECTOMY  5/00   COLONOSCOPY     implantable loop recorder placement  04/06/2019   MDT Reveal LINQ [id number] ([id number]) implanted for evaluation of palpitations and afib post atrial flutter ablation by Dr Johney Frame in office   INTRAMEDULLARY (IM) NAIL INTERTROCHANTERIC Left 09/23/2019   Procedure: INTRAMEDULLARY (IM) NAIL INTERTROCHANTRIC;  Surgeon: Roby Lofts, MD;  Location: [facility];  Service: Orthopedics;  Laterality: Left;   TONSILLECTOMY     TOTAL  ABDOMINAL HYSTERECTOMY W/ BILATERAL SALPINGOOPHORECTOMY  1980   UPPER GASTROINTESTINAL ENDOSCOPY     Patient Active Problem List   Diagnosis Date Noted   Spinal stenosis of lumbar region 04/22/2023   Breakage of internal fixation device in bone (HCC) 11/22/2019   Displaced intertrochanteric fracture of left femur, initial encounter for closed fracture (HCC) 09/22/2019   Hypomagnesemia 08/12/2019   Regurgitation of food 07/20/2019   Chronic midline low back pain without sciatica 10/22/2018   Primary insomnia 10/22/2018   Persistent atrial fibrillation (HCC) 04/27/2017   Chronic diastolic CHF (congestive heart failure) (HCC) 04/27/2017   CKD (chronic kidney disease) stage 3, GFR 30-59 ml/min (HCC) 05/14/2015   BMI 26.0-26.9,adult 05/11/2015   Peripheral edema 07/06/2014   Hypokalemia 07/22/2013   Hypothyroidism 07/22/2013   Osteopenia, senile 03/09/2013   Constipation 01/04/2013   Hypertension 05/07/2012   Hyperlipidemia associated with type 2 diabetes mellitus (HCC) 05/07/2012   Diabetes mellitus type 2, diet-controlled (HCC) 05/07/2012   GERD (gastroesophageal reflux disease) 05/07/2012      REFERRING PROVIDER: Annell Greening MD  REFERRING DIAG: Lumbar radiculopathy  Rationale for Evaluation and Treatment: Rehabilitation  THERAPY DIAG:  Other low back pain  Muscle spasm of back  Abnormal posture  ONSET DATE: 2020.  SUBJECTIVE:                                                                                                                                                                                           SUBJECTIVE STATEMENT: Doing about the same with LBP when standing. To MD after next visit  PERTINENT HISTORY:  OP, left hip ORIF.  PAIN:  Are you having pain? Yes: NPRS scale: 6-7/10 Pain location: Left low back. Pain description: Ache, hurt. Aggravating factors: As above. Relieving factors: As above.  PRECAUTIONS: Fall  RED FLAGS: None   WEIGHT  BEARING RESTRICTIONS: No  FALLS:  Has patient fallen in last 6 months? Yes. Number of falls 1.  LIVING ENVIRONMENT: Lives in: House/apartment Stairs: No Has following equipment at home: Dan Humphreys - 2 wheeled  OCCUPATION: Retired.  PLOF: Independent with basic ADLs and Independent with household mobility with device  PATIENT GOALS: Decrease pain especially when walking.    OBJECTIVE:   DIAGNOSTIC FINDINGS:   IMPRESSION:  05/18/19. No acute abnormality.  Remote T12 compression fracture is unchanged.   Spondylosis worst at L4-5 where there is moderately severe to severe central canal stenosis and marked narrowing in the right subarticular recess and foramen with encroachment on the exiting right L4 and descending right L5 roots.   Moderately severe central canal stenosis and right worse than left subarticular recess narrowing at L3-4.   Moderate narrowing in the left subarticular recess and foramen at L2-3 where there is mild to moderate central canal stenosis overall.  PATIENT SURVEYS:  FOTO 55.73.     POSTURE: rounded shoulders, forward head, decreased lumbar lordosis, and flexed trunk , genu valgum right > left.  PALPATION: Tender to palpation over left lower lumbar region and especially the left SIJ.  LOWER EXTREMITY ROM:   WFL, assessed in supine.    LOWER EXTREMITY MMT:    Left hip flexion and abduction is 4/5.  Bilateral knee and ankle strength is normal.  LUMBAR SPECIAL TESTS:  No pain increase with SLR testing.   GAIT: The patient walks safely (in some lumbar flexion) with a FWW.  TODAY'S TREATMENT:  DATE:     05/28/23:    US/ estim combo   1.5 w/cm2 x 12 mins to LT LB/ SIJ in sitting Manual: STW to LT side LB paras and SIJ in sitting x 12 mins HMP and IFC at 80-150 Hz on 40% scan x 15 minutes to patient's left low back.   Normal modality response following removal of modality.                                         05-14-23 US/ estim combo   1.5 w/cm2 x 12 mins to LT LB/ SIJ in sitting Manual: STW to LT side LB paras and SIJ in sitting x 12 mins HMP and IFC at 80-150 Hz on 40% scan x 15 minutes to patient's left low back.  Normal modality response following removal of modality.  PATIENT EDUCATION:    HOME EXERCISE PROGRAM:  ASSESSMENT:  CLINICAL IMPRESSION: FOTO   Patient with low pain upon arrival, but reports pain when walking/ standing is about the same. LTG's are mostly NM due to pain still above 3-4/10 when standing and ADL's. MD note next visit.   OBJECTIVE IMPAIRMENTS: Abnormal gait, decreased activity tolerance, decreased strength, increased muscle spasms, postural dysfunction, and pain.   ACTIVITY LIMITATIONS: standing and locomotion level  PARTICIPATION LIMITATIONS: meal prep, cleaning, and laundry  PERSONAL FACTORS: Time since onset of injury/illness/exacerbation and 1 comorbidity: left hip fracture and surgery  are also affecting patient's functional outcome.   REHAB POTENTIAL: Good  CLINICAL DECISION MAKING: Stable/uncomplicated  EVALUATION COMPLEXITY: Low   GOALS:  LONG TERM GOALS: Target date: 06/11/23  Ind with a HEP.  Goal status: On going  2.  Stand 20 minutes with pain not > 3/10.  Goal status:  On going  3.  Walk a community distance with pain not > 3-4/10.  Goal status:  On going  4.  Eliminate left LE symptoms.  Goal status: On going  PLAN:  PT FREQUENCY: 1x/week (patient request).  PT DURATION: 6 weeks  PLANNED INTERVENTIONS: Therapeutic exercises, Therapeutic activity, Gait training, Patient/Family education, Self Care, Dry Needling, Electrical stimulation, Cryotherapy, Moist heat, Ultrasound, and Manual therapy.  PLAN FOR NEXT SESSION: Modalities and STW/M as needed.  Hip bridges.  Low-level core exercises.   Mykela Mewborn,CHRIS, PTA 05/28/2023, 6:04 PM

## 2023-05-28 NOTE — Progress Notes (Signed)
Subjective:    Patient ID: Katie [name], female    DOB: 12-20-1931, 89 y.o.   MRN: [id number]  Chief Complaint  Patient presents with   Medical Management of Chronic Issues   Pt presents to the office today with lower back and left hip pain that started on and off over the last few months. She is followed by Ortho and doing PT. She saw her PCP last week and was given prednisone that helped. She completed these Sunday, but feels like if she "had one more round" would resolve her pain.    She is taking Ultram as needed.  Back Pain This is a chronic problem. The current episode started more than 1 year ago. The problem occurs intermittently. The problem has been waxing and waning since onset. The pain is present in the lumbar spine. The quality of the pain is described as aching. The pain is at a severity of 4/10. The pain is moderate.  Hip Pain  The incident occurred more than 1 week ago. There was no injury mechanism. The pain is present in the left hip. The quality of the pain is described as shooting. The pain is at a severity of 4/10. The pain is mild. She reports no foreign bodies present. The symptoms are aggravated by movement. She has tried NSAIDs for the symptoms. The treatment provided mild relief.      Review of Systems  Musculoskeletal:  Positive for back pain.  All other systems reviewed and are negative.      Objective:   Physical Exam Vitals reviewed.  Constitutional:      General: She is not in acute distress.    Appearance: She is well-developed.  HENT:     Head: Normocephalic and atraumatic.  Eyes:     Pupils: Pupils are equal, round, and reactive to light.  Neck:     Thyroid: No thyromegaly.  Cardiovascular:     Rate and Rhythm: Normal rate and regular rhythm.     Heart sounds: Normal heart sounds. No murmur heard. Pulmonary:     Effort: Pulmonary effort is normal. No respiratory distress.     Breath sounds: Normal breath sounds. No wheezing.  Abdominal:      General: Bowel sounds are normal. There is no distension.     Palpations: Abdomen is soft.     Tenderness: There is no abdominal tenderness.  Musculoskeletal:        General: No tenderness. Normal range of motion.     Cervical back: Normal range of motion and neck supple.     Comments: Full ROM of lumbar, limited ROM of left hip with internal and external rotation  Skin:    General: Skin is warm and dry.  Neurological:     Mental Status: She is alert and oriented to person, place, and time.     Cranial Nerves: No cranial nerve deficit.     Motor: Weakness (using rolling walker) present.     Gait: Gait abnormal.     Deep Tendon Reflexes: Reflexes are normal and symmetric.  Psychiatric:        Behavior: Behavior normal.        Thought Content: Thought content normal.        Judgment: Judgment normal.     BP (!) 191/74   Pulse 67   Temp (!) 97.3 F (36.3 C) (Temporal)   Ht 5\' 6"  (1.676 m)   Wt 153 lb (69.4 kg)   SpO2 94%  BMI 24.69 kg/m       Assessment & Plan:  Katie [name] comes in today with chief complaint of Medical Management of Chronic Issues   Diagnosis and orders addressed:  1. Chronic midline low back pain without sciatica - predniSONE (DELTASONE) 20 MG tablet; 2 po at sametime daily for 5 days-  Dispense: 10 tablet; Refill: 0  2. Left hip pain - predniSONE (DELTASONE) 20 MG tablet; 2 po at sametime daily for 5 days-  Dispense: 10 tablet; Refill: 0  Dicussed will give one additional prednisone rx Continue PT and Ultram as needed Keep follow up with PCP   Jannifer Rodney, FNP

## 2023-05-28 NOTE — Patient Instructions (Signed)

## 2023-06-04 ENCOUNTER — Encounter: Payer: Self-pay | Admitting: *Deleted

## 2023-06-04 ENCOUNTER — Ambulatory Visit: Payer: Medicare Other | Admitting: *Deleted

## 2023-06-04 DIAGNOSIS — R293 Abnormal posture: Secondary | ICD-10-CM | POA: Diagnosis not present

## 2023-06-04 DIAGNOSIS — M62830 Muscle spasm of back: Secondary | ICD-10-CM

## 2023-06-04 DIAGNOSIS — M5459 Other low back pain: Secondary | ICD-10-CM

## 2023-06-04 NOTE — Therapy (Signed)
OUTPATIENT PHYSICAL THERAPY THORACOLUMBAR TREATMENT   Patient Name: JNAYA MAGLEY MRN: [id number] DOB:1932/08/18, 89 y.o., female Today's Date: 06/04/2023  END OF SESSION:  PT End of Session - 06/04/23 1550     Visit Number 6    Number of Visits 6    Date for PT Re-Evaluation 06/11/23    Authorization Type FOTO    PT Start Time 1515    PT Stop Time 1604    PT Time Calculation (min) 49 min              Past Medical History:  Diagnosis Date   Anxiety    Aortic insufficiency    a. Trivial AI by echo 02/2016.   Atrial fibrillation and flutter (HCC)    a. Coarse afib vs flutter by EKG 12/2015.   Cancer (HCC)    skin cancer   Cataract    Chronic diastolic CHF (congestive heart failure) (HCC)    CKD (chronic kidney disease), stage III (HCC)    COVID-19    GERD (gastroesophageal reflux disease)    Hiatal hernia    Hypercholesterolemia    Hypertension    Hypokalemia    Hypothyroidism    Left knee pain    NIDDM (non-insulin dependent diabetes mellitus)    diet controlled    Osteoporosis    Premature atrial contractions    PVC's (premature ventricular contractions)    Vitamin D deficiency    Past Surgical History:  Procedure Laterality Date   A-FLUTTER ABLATION N/A 02/08/2019   Procedure: A-FLUTTER ABLATION;  Surgeon: Hillis Range, MD;  Location: [facility];  Service: Cardiovascular;  Laterality: N/A;   ABDOMINAL HYSTERECTOMY     APPENDECTOMY  1980   BACK SURGERY     CATARACT EXTRACTION, BILATERAL     CHOLECYSTECTOMY  5/00   COLONOSCOPY     implantable loop recorder placement  04/06/2019   MDT Reveal LINQ [id number] ([id number]) implanted for evaluation of palpitations and afib post atrial flutter ablation by Dr Johney Frame in office   INTRAMEDULLARY (IM) NAIL INTERTROCHANTERIC Left 09/23/2019   Procedure: INTRAMEDULLARY (IM) NAIL INTERTROCHANTRIC;  Surgeon: Roby Lofts, MD;  Location: [facility];  Service: Orthopedics;  Laterality: Left;   TONSILLECTOMY     TOTAL  ABDOMINAL HYSTERECTOMY W/ BILATERAL SALPINGOOPHORECTOMY  1980   UPPER GASTROINTESTINAL ENDOSCOPY     Patient Active Problem List   Diagnosis Date Noted   Spinal stenosis of lumbar region 04/22/2023   Breakage of internal fixation device in bone (HCC) 11/22/2019   Displaced intertrochanteric fracture of left femur, initial encounter for closed fracture (HCC) 09/22/2019   Hypomagnesemia 08/12/2019   Regurgitation of food 07/20/2019   Chronic midline low back pain without sciatica 10/22/2018   Primary insomnia 10/22/2018   Persistent atrial fibrillation (HCC) 04/27/2017   Chronic diastolic CHF (congestive heart failure) (HCC) 04/27/2017   CKD (chronic kidney disease) stage 3, GFR 30-59 ml/min (HCC) 05/14/2015   BMI 26.0-26.9,adult 05/11/2015   Peripheral edema 07/06/2014   Hypokalemia 07/22/2013   Hypothyroidism 07/22/2013   Osteopenia, senile 03/09/2013   Constipation 01/04/2013   Hypertension 05/07/2012   Hyperlipidemia associated with type 2 diabetes mellitus (HCC) 05/07/2012   Diabetes mellitus type 2, diet-controlled (HCC) 05/07/2012   GERD (gastroesophageal reflux disease) 05/07/2012      REFERRING PROVIDER: Annell Greening MD  REFERRING DIAG: Lumbar radiculopathy  Rationale for Evaluation and Treatment: Rehabilitation  THERAPY DIAG:  Abnormal posture  Muscle spasm of back  Other low back pain  ONSET DATE:  2020.  SUBJECTIVE:                                                                                                                                                                                           SUBJECTIVE STATEMENT: Doing about the same with LBP when standing. To MD next week  PERTINENT HISTORY:  OP, left hip ORIF.  PAIN:  Are you having pain? Yes: NPRS scale: 6-7/10 Pain location: Left low back. Pain description: Ache, hurt. Aggravating factors: As above. Relieving factors: As above.  PRECAUTIONS: Fall  RED FLAGS: None   WEIGHT BEARING  RESTRICTIONS: No  FALLS:  Has patient fallen in last 6 months? Yes. Number of falls 1.  LIVING ENVIRONMENT: Lives in: House/apartment Stairs: No Has following equipment at home: Dan Humphreys - 2 wheeled  OCCUPATION: Retired.  PLOF: Independent with basic ADLs and Independent with household mobility with device  PATIENT GOALS: Decrease pain especially when walking.    OBJECTIVE:   DIAGNOSTIC FINDINGS:   IMPRESSION:  05/18/19. No acute abnormality.  Remote T12 compression fracture is unchanged.   Spondylosis worst at L4-5 where there is moderately severe to severe central canal stenosis and marked narrowing in the right subarticular recess and foramen with encroachment on the exiting right L4 and descending right L5 roots.   Moderately severe central canal stenosis and right worse than left subarticular recess narrowing at L3-4.   Moderate narrowing in the left subarticular recess and foramen at L2-3 where there is mild to moderate central canal stenosis overall.  PATIENT SURVEYS:  FOTO 55.73.     POSTURE: rounded shoulders, forward head, decreased lumbar lordosis, and flexed trunk , genu valgum right > left.  PALPATION: Tender to palpation over left lower lumbar region and especially the left SIJ.  LOWER EXTREMITY ROM:   WFL, assessed in supine.    LOWER EXTREMITY MMT:    Left hip flexion and abduction is 4/5.  Bilateral knee and ankle strength is normal.  LUMBAR SPECIAL TESTS:  No pain increase with SLR testing.   GAIT: The patient walks safely (in some lumbar flexion) with a FWW.  TODAY'S TREATMENT:  DATE:     06/04/23:    US/ estim combo   1.5 w/cm2 x 10 mins to LT LB/ SIJ in sitting Manual: STW to LT side LB paras and SIJ in sitting  HMP and IFC at 80-150 Hz on 40% scan x 15 minutes to patient's left low back.   Normal modality  response following removal of modality.                                         05-14-23 US/ estim combo   1.5 w/cm2 x 12 mins to LT LB/ SIJ in sitting Manual: STW to LT side LB paras and SIJ in sitting x 12 mins HMP and IFC at 80-150 Hz on 40% scan x 15 minutes to patient's left low back.  Normal modality response following removal of modality.  PATIENT EDUCATION:    HOME EXERCISE PROGRAM:  ASSESSMENT:  CLINICAL IMPRESSION: FOTO performed  Patient arrived doing about the same with low pain levels when sitting, but pain increases when standing and walking. LTG's are mostly NM due to pain still above 3-4/10 when standing and ADL's. MD note   OBJECTIVE IMPAIRMENTS: Abnormal gait, decreased activity tolerance, decreased strength, increased muscle spasms, postural dysfunction, and pain.   ACTIVITY LIMITATIONS: standing and locomotion level  PARTICIPATION LIMITATIONS: meal prep, cleaning, and laundry  PERSONAL FACTORS: Time since onset of injury/illness/exacerbation and 1 comorbidity: left hip fracture and surgery  are also affecting patient's functional outcome.   REHAB POTENTIAL: Good  CLINICAL DECISION MAKING: Stable/uncomplicated  EVALUATION COMPLEXITY: Low   GOALS:  LONG TERM GOALS: Target date: 06/11/23  Ind with a HEP.  Goal status: Partially met  2.  Stand 20 minutes with pain not > 3/10.  Goal status:  Not met  3.  Walk a community distance with pain not > 3-4/10.  Goal status:  Not met  4.  Eliminate left LE symptoms.  Goal status: Not met  PLAN:  PT FREQUENCY: 1x/week (patient request).  PT DURATION: 6 weeks  PLANNED INTERVENTIONS: Therapeutic exercises, Therapeutic activity, Gait training, Patient/Family education, Self Care, Dry Needling, Electrical stimulation, Cryotherapy, Moist heat, Ultrasound, and Manual therapy.  PLAN FOR NEXT SESSION: MD note   Gustave Lindeman,CHRIS, PTA 06/04/2023, 4:54 PM

## 2023-06-09 ENCOUNTER — Ambulatory Visit: Payer: Medicare Other | Admitting: Orthopaedic Surgery

## 2023-06-09 ENCOUNTER — Encounter: Payer: Self-pay | Admitting: Orthopaedic Surgery

## 2023-06-09 VITALS — BP 174/74 | HR 81 | Ht 66.0 in | Wt 153.0 lb

## 2023-06-09 DIAGNOSIS — M48062 Spinal stenosis, lumbar region with neurogenic claudication: Secondary | ICD-10-CM | POA: Diagnosis not present

## 2023-06-10 ENCOUNTER — Other Ambulatory Visit: Payer: Self-pay | Admitting: Nurse Practitioner

## 2023-06-10 DIAGNOSIS — Z1231 Encounter for screening mammogram for malignant neoplasm of breast: Secondary | ICD-10-CM

## 2023-06-10 NOTE — Progress Notes (Signed)
Office Visit Note   Patient: Katie [name]           Date of Birth: [birth year]           MRN: [id number] Visit Date: 06/09/2023              Requested by: Bennie Pierini, FNP [address] [place],  [place] [address] PCP: Bennie Pierini, FNP   Assessment & Plan: Visit Diagnoses:  1. Spinal stenosis of lumbar region with neurogenic claudication     Plan: Will proceed with an MRI scan lumbar to evaluate her for ongoing severe back pain neurogenic claudication symptoms and left thigh pain with a history of L2-3 disc protrusion and severe stenosis at L4-5.  I will follow-up after scan and we can discuss surgical options.  She has had previous epidural injections in July which she states did not help.  Follow-Up Instructions: No follow-ups on file.   Orders:  Orders Placed This Encounter  Procedures   MR Lumbar Spine w/o contrast   No orders of the defined types were placed in this encounter.     Procedures: No procedures performed   Clinical Data: No additional findings.   Subjective: Chief Complaint  Patient presents with   Lower Back - Pain, Follow-up    HPI 89 year old female returns with ongoing problems with back pain with history of severe stenosis L4-5 noted on MRI 2020.  She also had moderately severe stenosis at L3-4.  She is continue to have neurogenic claudication symptoms with pain that radiates mostly into her left thigh.  Previous history of L2-3 disc protrusion.  Additionally she has had problems with stage III kidney disease type 2 diabetes, hypertension peripheral edema previous intertrochanteric hip fracture treated with trochanteric nail on the left.  Patient's been through therapy for her back but has not gotten any relief.  She still has to ambulate with a walker is taking tramadol and Tylenol.  Review of Systems all systems updated and are noncontributory to HPI.   Objective: Vital Signs: BP (!) 174/74   Pulse 81   Ht 5\' 6"   (1.676 m)   Wt 153 lb (69.4 kg)   BMI 24.69 kg/m   Physical Exam Constitutional:      Appearance: She is well-developed.  HENT:     Head: Normocephalic.     Right Ear: External ear normal.     Left Ear: External ear normal. There is no impacted cerumen.  Eyes:     Pupils: Pupils are equal, round, and reactive to light.  Neck:     Thyroid: No thyromegaly.     Trachea: No tracheal deviation.  Cardiovascular:     Rate and Rhythm: Normal rate.  Pulmonary:     Effort: Pulmonary effort is normal.  Abdominal:     Palpations: Abdomen is soft.  Musculoskeletal:     Cervical back: No rigidity.  Skin:    General: Skin is warm and dry.  Neurological:     Mental Status: She is alert and oriented to person, place, and time.  Psychiatric:        Behavior: Behavior normal.     Ortho Exam intact knee and ankle jerk no pain with hip internal/external rotation right and left.  She has bilateral decreased quad strength and some symmetrical decreased dorsiflexion plantarflexion strength.  Specialty Comments:  MRI LUMBAR SPINE WITHOUT CONTRAST   TECHNIQUE: Multiplanar, multisequence MR imaging of the lumbar spine was performed. No intravenous contrast was administered.  COMPARISON:  CT lumbar spine 04/13/2015.   FINDINGS: Segmentation:  Standard.   Alignment:  Convex right scoliosis noted.   Vertebrae: No acute abnormality or worrisome marrow lesion. Multiple Schmorl's nodes are seen. Mild, remote superior endplate compression fracture of T12 is unchanged.   Conus medullaris and cauda equina: Conus extends to the L1-2 level. Conus and cauda equina appear normal.   Paraspinal and other soft tissues: Right renal cyst is identified.   Disc levels:   T11-12 is imaged in the sagittal plane only. There is a minimal bulge without stenosis.   T12-L1: Mild-to-moderate facet degenerative change. Otherwise negative.   L1-2: Shallow disc bulge to the right and mild ligamentum  flavum thickening. Mild facet arthropathy. No stenosis.   L2-3: Loss of disc space height with a shallow bulge and superimposed left subarticular recess and foraminal protrusion. There is mild to moderate central canal stenosis. Moderate narrowing in the left subarticular recess and foramen is also identified. The right foramen is open.   L3-4: Ligamentum flavum thickening and a broad-based disc bulge cause moderately severe central canal stenosis and right worse than left subarticular recess narrowing. Mild to moderate foraminal narrowing is worse on the right.   L4-5: Bulky ligamentum flavum thickening, broad-based disc bulge and facet degenerative disease worse on the right where there is marrow edema in the right pedicles consistent with secondary stress change. There is moderately severe to severe central canal stenosis and marked narrowing in the right subarticular recess and foramen. Mild to moderate left foraminal narrowing is present.   L5-S1: Shallow disc bulge with facet degenerative disease. No stenosis.   IMPRESSION: No acute abnormality.  Remote T12 compression fracture is unchanged.   Spondylosis worst at L4-5 where there is moderately severe to severe central canal stenosis and marked narrowing in the right subarticular recess and foramen with encroachment on the exiting right L4 and descending right L5 roots.   Moderately severe central canal stenosis and right worse than left subarticular recess narrowing at L3-4.   Moderate narrowing in the left subarticular recess and foramen at L2-3 where there is mild to moderate central canal stenosis overall.     Electronically Signed   By: Drusilla Kanner M.D.   On: 05/18/2019 09:28  Imaging: No results found.   PMFS History: Patient Active Problem List   Diagnosis Date Noted   Spinal stenosis of lumbar region 04/22/2023   Breakage of internal fixation device in bone (HCC) 11/22/2019   Displaced  intertrochanteric fracture of left femur, initial encounter for closed fracture (HCC) 09/22/2019   Hypomagnesemia 08/12/2019   Regurgitation of food 07/20/2019   Chronic midline low back pain without sciatica 10/22/2018   Primary insomnia 10/22/2018   Persistent atrial fibrillation (HCC) 04/27/2017   Chronic diastolic CHF (congestive heart failure) (HCC) 04/27/2017   CKD (chronic kidney disease) stage 3, GFR 30-59 ml/min (HCC) 05/14/2015   BMI 26.0-26.9,adult 05/11/2015   Peripheral edema 07/06/2014   Hypokalemia 07/22/2013   Hypothyroidism 07/22/2013   Osteopenia, senile 03/09/2013   Constipation 01/04/2013   Hypertension 05/07/2012   Hyperlipidemia associated with type 2 diabetes mellitus (HCC) 05/07/2012   Diabetes mellitus type 2, diet-controlled (HCC) 05/07/2012   GERD (gastroesophageal reflux disease) 05/07/2012   Past Medical History:  Diagnosis Date   Anxiety    Aortic insufficiency    a. Trivial AI by echo 02/2016.   Atrial fibrillation and flutter (HCC)    a. Coarse afib vs flutter by EKG 12/2015.   Cancer (HCC)  skin cancer   Cataract    Chronic diastolic CHF (congestive heart failure) (HCC)    CKD (chronic kidney disease), stage III (HCC)    COVID-19    GERD (gastroesophageal reflux disease)    Hiatal hernia    Hypercholesterolemia    Hypertension    Hypokalemia    Hypothyroidism    Left knee pain    NIDDM (non-insulin dependent diabetes mellitus)    diet controlled    Osteoporosis    Premature atrial contractions    PVC's (premature ventricular contractions)    Vitamin D deficiency     Family History  Problem Relation Age of Onset   Diabetes Mother    Stroke Mother    Heart disease Father    Stroke Father    Uterine cancer Sister    Diabetes Sister    Ovarian cancer Sister    Colon cancer Sister    Diabetes Sister    Liver cancer Sister        \   Atrial fibrillation Sister    Diabetes Sister    Diabetes Brother    Dementia Brother     Diabetes Son    Healthy Son    Heart attack Neg Hx    Hypertension Neg Hx    Esophageal cancer Neg Hx    Rectal cancer Neg Hx    Stomach cancer Neg Hx    Breast cancer Neg Hx     Past Surgical History:  Procedure Laterality Date   A-FLUTTER ABLATION N/A 02/08/2019   Procedure: A-FLUTTER ABLATION;  Surgeon: Hillis Range, MD;  Location: [facility];  Service: Cardiovascular;  Laterality: N/A;   ABDOMINAL HYSTERECTOMY     APPENDECTOMY  1980   BACK SURGERY     CATARACT EXTRACTION, BILATERAL     CHOLECYSTECTOMY  5/00   COLONOSCOPY     implantable loop recorder placement  04/06/2019   MDT Reveal LINQ [id number] ([id number]) implanted for evaluation of palpitations and afib post atrial flutter ablation by Dr Johney Frame in office   INTRAMEDULLARY (IM) NAIL INTERTROCHANTERIC Left 09/23/2019   Procedure: INTRAMEDULLARY (IM) NAIL INTERTROCHANTRIC;  Surgeon: Roby Lofts, MD;  Location: [facility];  Service: Orthopedics;  Laterality: Left;   TONSILLECTOMY     TOTAL ABDOMINAL HYSTERECTOMY W/ BILATERAL SALPINGOOPHORECTOMY  1980   UPPER GASTROINTESTINAL ENDOSCOPY     Social History   Occupational History   Occupation: Retired    Comment: Ambulance person , golf, farm   Tobacco Use   Smoking status: Never   Smokeless tobacco: Never  Vaping Use   Vaping status: Never Used  Substance and Sexual Activity   Alcohol use: No   Drug use: No   Sexual activity: Not Currently

## 2023-06-12 ENCOUNTER — Inpatient Hospital Stay: Admission: RE | Admit: 2023-06-12 | Payer: Medicare Other | Source: Ambulatory Visit

## 2023-06-15 ENCOUNTER — Encounter: Payer: Self-pay | Admitting: Nurse Practitioner

## 2023-06-15 ENCOUNTER — Ambulatory Visit (INDEPENDENT_AMBULATORY_CARE_PROVIDER_SITE_OTHER): Payer: Medicare Other | Admitting: Nurse Practitioner

## 2023-06-15 VITALS — BP 213/78 | HR 72 | Temp 97.2°F | Resp 20 | Ht 66.0 in | Wt 151.0 lb

## 2023-06-15 DIAGNOSIS — S40022A Contusion of left upper arm, initial encounter: Secondary | ICD-10-CM | POA: Diagnosis not present

## 2023-06-15 NOTE — Progress Notes (Signed)
Subjective:    Patient ID: Katie [name], female    DOB: 1931/12/24, 89 y.o.   MRN: [id number]   Chief Complaint: Knot on left upper arm   HPI Patient ws making her bed a week ago and [name] a pop in her left upper arm. A knot immediately popped up. Is still there, but has gone down some.  Patient Active Problem List   Diagnosis Date Noted   Spinal stenosis of lumbar region 04/22/2023   Breakage of internal fixation device in bone (HCC) 11/22/2019   Displaced intertrochanteric fracture of left femur, initial encounter for closed fracture (HCC) 09/22/2019   Hypomagnesemia 08/12/2019   Regurgitation of food 07/20/2019   Chronic midline low back pain without sciatica 10/22/2018   Primary insomnia 10/22/2018   Persistent atrial fibrillation (HCC) 04/27/2017   Chronic diastolic CHF (congestive heart failure) (HCC) 04/27/2017   CKD (chronic kidney disease) stage 3, GFR 30-59 ml/min (HCC) 05/14/2015   BMI 26.0-26.9,adult 05/11/2015   Peripheral edema 07/06/2014   Hypokalemia 07/22/2013   Hypothyroidism 07/22/2013   Osteopenia, senile 03/09/2013   Constipation 01/04/2013   Hypertension 05/07/2012   Hyperlipidemia associated with type 2 diabetes mellitus (HCC) 05/07/2012   Diabetes mellitus type 2, diet-controlled (HCC) 05/07/2012   GERD (gastroesophageal reflux disease) 05/07/2012       Review of Systems  Constitutional:  Negative for diaphoresis.  Eyes:  Negative for pain.  Respiratory:  Negative for shortness of breath.   Cardiovascular:  Negative for chest pain, palpitations and leg swelling.  Gastrointestinal:  Negative for abdominal pain.  Endocrine: Negative for polydipsia.  Skin:  Negative for rash.  Neurological:  Negative for dizziness, weakness and headaches.  Hematological:  Does not bruise/bleed easily.  All other systems reviewed and are negative.      Objective:   Physical Exam Constitutional:      Appearance: Normal appearance.  Cardiovascular:     Rate  and Rhythm: Normal rate and regular rhythm.     Heart sounds: Normal heart sounds.  Pulmonary:     Effort: Pulmonary effort is normal.     Breath sounds: Normal breath sounds.  Skin:    General: Skin is warm.     Findings: Bruising (10cm contusion to left upper arm with tender orange size nodule) present.  Neurological:     General: No focal deficit present.     Mental Status: She is alert and oriented to person, place, and time.  Psychiatric:        Mood and Affect: Mood normal.        Behavior: Behavior normal.    BP (!) 213/78   Pulse 72   Temp (!) 97.2 F (36.2 C) (Temporal)   Resp 20   Ht 5\' 6"  (1.676 m)   Wt 151 lb (68.5 kg)   SpO2 92%   BMI 24.37 kg/m         Assessment & Plan:   [name] in today with chief complaint of Knot on left upper arm   1. Contusion of left upper arm, initial encounter Ice BID Continue tylenol If not improving by Monday will order ultra sound    The above assessment and management plan was discussed with the patient. The patient verbalized understanding of and has agreed to the management plan. Patient is aware to call the clinic if symptoms persist or worsen. Patient is aware when to return to the clinic for a follow-up visit. Patient educated on when it is appropriate to  go to the emergency department.   Mary-Margaret Daphine Deutscher, FNP

## 2023-06-15 NOTE — Patient Instructions (Signed)
Contusion A contusion is a deep bruise. This is a result of an injury that causes bleeding under the skin. Symptoms of bruising include pain, swelling, and discolored skin. The skin may turn blue, purple, or yellow. Follow these instructions at home: Managing pain, stiffness, and swelling You may use RICE. This stands for: Resting. Icing. Compression, or putting pressure on the injured area. Elevating, or raising the injured area. To follow this method, do these actions: Rest the injured area. If told, put ice on the injured area. To do this: Put ice in a plastic bag. Place a towel between your skin and the bag. Leave the ice on for 20 minutes, 2-3 times per day. If your skin turns bright red, take off the ice right away to prevent skin damage. The risk of skin damage is higher if you cannot feel pain, heat, or cold. If told, apply compression on the injured area using an elastic bandage. Make sure the bandage is not too tight. If the area tingles or has a loss of feeling (numbness), remove it and put it back on as told by your doctor. If possible, elevate the injured area above the level of your heart while you are sitting or lying down.  General instructions Take over-the-counter and prescription medicines only as told by your doctor. Keep all follow-up visits. Your doctor may want to see how your contusion is healing with treatment. Contact a doctor if: Your symptoms do not get better after several days of treatment. Your symptoms get worse. You have trouble moving the injured area. Get help right away if: You have very bad pain. You have a loss of feeling (numbness) in a hand or foot. Your hand or foot turns pale or cold. This information is not intended to replace advice given to you by your health care provider. Make sure you discuss any questions you have with your health care provider. Document Revised: 01/20/2022 Document Reviewed: 01/20/2022 Elsevier Patient Education  2024  [employer].

## 2023-06-17 ENCOUNTER — Telehealth: Payer: Self-pay | Admitting: Orthopaedic Surgery

## 2023-06-17 NOTE — Telephone Encounter (Signed)
noted 

## 2023-06-17 NOTE — Telephone Encounter (Signed)
Called patient to schedule MRI review with Dr. Ophelia Charter. MRI is on 07/21/2023. Left message to give a call back.

## 2023-07-08 ENCOUNTER — Ambulatory Visit
Admission: RE | Admit: 2023-07-08 | Discharge: 2023-07-08 | Disposition: A | Discharge status: Home / Self Care | Payer: Medicare Other | Source: Ambulatory Visit | Attending: Nurse Practitioner | Admitting: Nurse Practitioner

## 2023-07-08 DIAGNOSIS — Z1231 Encounter for screening mammogram for malignant neoplasm of breast: Secondary | ICD-10-CM

## 2023-07-13 ENCOUNTER — Ambulatory Visit: Payer: Medicare Other | Admitting: Nurse Practitioner

## 2023-07-21 ENCOUNTER — Ambulatory Visit
Admission: RE | Admit: 2023-07-21 | Discharge: 2023-07-21 | Disposition: A | Discharge status: Home / Self Care | Payer: Medicare Other | Source: Ambulatory Visit | Attending: Orthopaedic Surgery | Admitting: Orthopaedic Surgery

## 2023-07-21 ENCOUNTER — Other Ambulatory Visit: Payer: Medicare Other

## 2023-07-21 DIAGNOSIS — M47816 Spondylosis without myelopathy or radiculopathy, lumbar region: Secondary | ICD-10-CM | POA: Diagnosis not present

## 2023-07-21 DIAGNOSIS — M4186 Other forms of scoliosis, lumbar region: Secondary | ICD-10-CM | POA: Diagnosis not present

## 2023-07-21 DIAGNOSIS — M48061 Spinal stenosis, lumbar region without neurogenic claudication: Secondary | ICD-10-CM | POA: Diagnosis not present

## 2023-07-21 DIAGNOSIS — M48062 Spinal stenosis, lumbar region with neurogenic claudication: Secondary | ICD-10-CM

## 2023-08-18 ENCOUNTER — Ambulatory Visit: Payer: Medicare Other | Admitting: Orthopaedic Surgery

## 2023-08-21 ENCOUNTER — Ambulatory Visit: Payer: Medicare Other | Admitting: Orthopaedic Surgery

## 2023-08-21 ENCOUNTER — Ambulatory Visit: Payer: Medicare Other | Admitting: Cardiology

## 2023-09-01 ENCOUNTER — Ambulatory Visit: Payer: Medicare Other | Admitting: Orthopaedic Surgery

## 2023-09-01 VITALS — BP 163/71 | HR 84 | Ht 66.0 in | Wt 151.0 lb

## 2023-09-01 DIAGNOSIS — M48062 Spinal stenosis, lumbar region with neurogenic claudication: Secondary | ICD-10-CM | POA: Diagnosis not present

## 2023-09-01 MED ORDER — PREDNISONE 5 MG (21) PO TBPK: ORAL_TABLET | ORAL | 0 refills | Status: DC

## 2023-09-01 NOTE — Progress Notes (Signed)
 Office Visit Note   Patient: Katie [name]           Date of Birth: 02/14/1932           MRN: [id number] Visit Date: 09/01/2023              Requested by: Gladis Mustard, FNP [address] [place],  [place] [address] PCP: Gladis Mustard, FNP   Assessment & Plan: Visit Diagnoses:  1. Spinal stenosis of lumbar region with neurogenic claudication     Plan: We reviewed images previous MRI as well as today's MRI discussed options including decompression surgery.  She states she actually got more relief with the oral prednisone  and will send in 5 mg Dosepak.  Recheck 1 month.  Discussed with her the only way to help with her claudication symptoms would be decompression surgery.  We discussed this today with her son present.  Follow-Up Instructions: Return in about 1 month (around 10/02/2023).   Orders:  No orders of the defined types were placed in this encounter.  Meds ordered this encounter  Medications   predniSONE  (STERAPRED UNI-PAK 21 TAB) 5 MG (21) TBPK tablet    Sig: Take 6,5,4,3,2,1 one tablet less each day with food    Dispense:  21 tablet    Refill:  0      Procedures: No procedures performed   Clinical Data: No additional findings.   Subjective: Chief Complaint  Patient presents with   Lower Back - Pain, Follow-up    MRI review    HPI 89 year old female with lumbar spinal stenosis and neurogenic claudication symptoms returns.  She has had new updated MRI scan which shows slightly improved severe stenosis at L4-5 and progression of severe stenosis at the L3-4 level.  She has claudication gets relief with sitting.  Recently in December she had a prednisone  Dosepak which gave her relief.  Previous to epidural she states only lasted a day or so an epidural prior to that time and given her relief for many months.  Additionally she has atrial fibs on Eliquis.  Stage III kidney disease type 2 diabetes diet-controlled.  Review of Systems all systems  updated unchanged from 04/21/2023 office visit.  Hip fracture fixed with intramedullary nail Dr. Kendal February 2021 with slight shortening on the left but healed hip fracture.   Objective: Vital Signs: BP (!) 163/71   Pulse 84   Ht 5' 6 (1.676 m)   Wt 151 lb (68.5 kg)   BMI 24.37 kg/m   Physical Exam Constitutional:      Appearance: She is well-developed.  HENT:     Head: Normocephalic.     Right Ear: External ear normal.     Left Ear: External ear normal. There is no impacted cerumen.  Eyes:     Pupils: Pupils are equal, round, and reactive to light.  Neck:     Thyroid : No thyromegaly.     Trachea: No tracheal deviation.  Cardiovascular:     Rate and Rhythm: Normal rate.  Pulmonary:     Effort: Pulmonary effort is normal.  Abdominal:     Palpations: Abdomen is soft.  Musculoskeletal:     Cervical back: No rigidity.  Skin:    General: Skin is warm and dry.  Neurological:     Mental Status: She is alert and oriented to person, place, and time.  Psychiatric:        Behavior: Behavior normal.     Ortho Exam patient has 1 cm  shortening left femur.  Tenderness over the trochanter tenderness over the sciatic notch mild discomfort straight leg raising 90 degrees.  Specialty Comments:  MRI LUMBAR SPINE WITHOUT CONTRAST   TECHNIQUE: Multiplanar, multisequence MR imaging of the lumbar spine was performed. No intravenous contrast was administered.   COMPARISON:  CT lumbar spine 04/13/2015.   FINDINGS: Segmentation:  Standard.   Alignment:  Convex right scoliosis noted.   Vertebrae: No acute abnormality or worrisome marrow lesion. Multiple Schmorl's nodes are seen. Mild, remote superior endplate compression fracture of T12 is unchanged.   Conus medullaris and cauda equina: Conus extends to the L1-2 level. Conus and cauda equina appear normal.   Paraspinal and other soft tissues: Right renal cyst is identified.   Disc levels:   T11-12 is imaged in the sagittal  plane only. There is a minimal bulge without stenosis.   T12-L1: Mild-to-moderate facet degenerative change. Otherwise negative.   L1-2: Shallow disc bulge to the right and mild ligamentum flavum thickening. Mild facet arthropathy. No stenosis.   L2-3: Loss of disc space height with a shallow bulge and superimposed left subarticular recess and foraminal protrusion. There is mild to moderate central canal stenosis. Moderate narrowing in the left subarticular recess and foramen is also identified. The right foramen is open.   L3-4: Ligamentum flavum thickening and a broad-based disc bulge cause moderately severe central canal stenosis and right worse than left subarticular recess narrowing. Mild to moderate foraminal narrowing is worse on the right.   L4-5: Bulky ligamentum flavum thickening, broad-based disc bulge and facet degenerative disease worse on the right where there is marrow edema in the right pedicles consistent with secondary stress change. There is moderately severe to severe central canal stenosis and marked narrowing in the right subarticular recess and foramen. Mild to moderate left foraminal narrowing is present.   L5-S1: Shallow disc bulge with facet degenerative disease. No stenosis.   IMPRESSION: No acute abnormality.  Remote T12 compression fracture is unchanged.   Spondylosis worst at L4-5 where there is moderately severe to severe central canal stenosis and marked narrowing in the right subarticular recess and foramen with encroachment on the exiting right L4 and descending right L5 roots.   Moderately severe central canal stenosis and right worse than left subarticular recess narrowing at L3-4.   Moderate narrowing in the left subarticular recess and foramen at L2-3 where there is mild to moderate central canal stenosis overall.     Electronically Signed   By: Debby Prader M.D.   On: 05/18/2019 09:28  Imaging: No results found.   PMFS  History: Patient Active Problem List   Diagnosis Date Noted   Spinal stenosis of lumbar region 04/22/2023   Breakage of internal fixation device in bone (HCC) 11/22/2019   Displaced intertrochanteric fracture of left femur, initial encounter for closed fracture (HCC) 09/22/2019   Hypomagnesemia 08/12/2019   Regurgitation of food 07/20/2019   Chronic midline low back pain without sciatica 10/22/2018   Primary insomnia 10/22/2018   Persistent atrial fibrillation (HCC) 04/27/2017   Chronic diastolic CHF (congestive heart failure) (HCC) 04/27/2017   CKD (chronic kidney disease) stage 3, GFR 30-59 ml/min (HCC) 05/14/2015   BMI 26.0-26.9,adult 05/11/2015   Peripheral edema 07/06/2014   Hypokalemia 07/22/2013   Hypothyroidism 07/22/2013   Osteopenia, senile 03/09/2013   Constipation 01/04/2013   Hypertension 05/07/2012   Hyperlipidemia associated with type 2 diabetes mellitus (HCC) 05/07/2012   Diabetes mellitus type 2, diet-controlled (HCC) 05/07/2012   GERD (gastroesophageal reflux disease)  05/07/2012   Past Medical History:  Diagnosis Date   Anxiety    Aortic insufficiency    a. Trivial AI by echo 02/2016.   Atrial fibrillation and flutter (HCC)    a. Coarse afib vs flutter by EKG 12/2015.   Cancer (HCC)    skin cancer   Cataract    Chronic diastolic CHF (congestive heart failure) (HCC)    CKD (chronic kidney disease), stage III (HCC)    COVID-19    GERD (gastroesophageal reflux disease)    Hiatal hernia    Hypercholesterolemia    Hypertension    Hypokalemia    Hypothyroidism    Left knee pain    NIDDM (non-insulin  dependent diabetes mellitus)    diet controlled    Osteoporosis    Premature atrial contractions    PVC's (premature ventricular contractions)    Vitamin D  deficiency     Family History  Problem Relation Age of Onset   Diabetes Mother    Stroke Mother    Heart disease Father    Stroke Father    Uterine cancer Sister    Diabetes Sister    Ovarian cancer  Sister    Colon cancer Sister    Diabetes Sister    Liver cancer Sister        \   Atrial fibrillation Sister    Diabetes Sister    Diabetes Brother    Dementia Brother    Diabetes Son    Healthy Son    Heart attack Neg Hx    Hypertension Neg Hx    Esophageal cancer Neg Hx    Rectal cancer Neg Hx    Stomach cancer Neg Hx    Breast cancer Neg Hx     Past Surgical History:  Procedure Laterality Date   A-FLUTTER ABLATION N/A 02/08/2019   Procedure: A-FLUTTER ABLATION;  Surgeon: Kelsie Agent, MD;  Location: [facility];  Service: Cardiovascular;  Laterality: N/A;   ABDOMINAL HYSTERECTOMY     APPENDECTOMY  1980   BACK SURGERY     CATARACT EXTRACTION, BILATERAL     CHOLECYSTECTOMY  5/00   COLONOSCOPY     implantable loop recorder placement  04/06/2019   MDT Reveal LINQ [id number] ([id number]) implanted for evaluation of palpitations and afib post atrial flutter ablation by Dr Kelsie in office   INTRAMEDULLARY (IM) NAIL INTERTROCHANTERIC Left 09/23/2019   Procedure: INTRAMEDULLARY (IM) NAIL INTERTROCHANTRIC;  Surgeon: Kendal Franky SQUIBB, MD;  Location: [facility] OR;  Service: Orthopedics;  Laterality: Left;   TONSILLECTOMY     TOTAL ABDOMINAL HYSTERECTOMY W/ BILATERAL SALPINGOOPHORECTOMY  1980   UPPER GASTROINTESTINAL ENDOSCOPY     Social History   Occupational History   Occupation: Retired    Comment: Ambulance Person , golf, farm   Tobacco Use   Smoking status: Never   Smokeless tobacco: Never  Vaping Use   Vaping status: Never Used  Substance and Sexual Activity   Alcohol use: No   Drug use: No   Sexual activity: Not Currently

## 2023-09-04 ENCOUNTER — Ambulatory Visit (INDEPENDENT_AMBULATORY_CARE_PROVIDER_SITE_OTHER): Payer: Medicare Other

## 2023-09-04 VITALS — Ht 66.0 in | Wt 151.0 lb

## 2023-09-04 DIAGNOSIS — Z0000 Encounter for general adult medical examination without abnormal findings: Secondary | ICD-10-CM

## 2023-09-04 NOTE — Patient Instructions (Signed)
Ms. Katie [name] , Thank you for taking time to come for your Medicare Wellness Visit. I appreciate your ongoing commitment to your health goals. Please review the following plan we discussed and let me know if I can assist you in the future.   Referrals/Orders/Follow-Ups/Clinician Recommendations: Aim for 30 minutes of exercise or brisk walking, 6-8 glasses of water, and 5 servings of fruits and vegetables each day.  This is a list of the screening recommended for you and due dates:  Health Maintenance  Topic Date Due   Colon Cancer Screening  05/28/2017   COVID-19 Vaccine (3 - Moderna risk series) 02/27/2021   DTaP/Tdap/Td vaccine (2 - Td or Tdap) 11/30/2021   Complete foot exam   04/05/2023   Hemoglobin A1C  07/09/2023   Flu Shot  11/16/2023*   Eye exam for diabetics  10/01/2023   Mammogram  07/07/2024   Medicare Annual Wellness Visit  09/03/2024   DEXA scan (bone density measurement)  10/07/2024   Pneumonia Vaccine  Completed   Zoster (Shingles) Vaccine  Completed   HPV Vaccine  Aged Out  *Topic was postponed. The date shown is not the original due date.    Advanced directives: (ACP Link)Information on Advanced Care Planning can be found at [facility] [place] Advance Health Care Directives Advance Health Care Directives ([email or web address]   Next Medicare Annual Wellness Visit scheduled for next year: Yes

## 2023-09-04 NOTE — Progress Notes (Signed)
Subjective:   Katie [name] is a 89 y.o. female who presents for Medicare Annual (Subsequent) preventive examination.  Visit Complete: Virtual I connected with  Katie [name] on 09/04/23 by a audio enabled telemedicine application and verified that I am speaking with the correct person using two identifiers.  Patient Location: Home  Provider Location: Home Office  This patient declined Interactive audio and video telecommunications. Therefore the visit was completed with audio only.  I discussed the limitations of evaluation and management by telemedicine. The patient expressed understanding and agreed to proceed.  Vital Signs: Because this visit was a virtual/telehealth visit, some criteria may be missing or patient reported. Any vitals not documented were not able to be obtained and vitals that have been documented are patient reported.    Cardiac Risk Factors include: advanced age (>73men, >78 women);hypertension;diabetes mellitus;dyslipidemia     Objective:    Today's Vitals   09/04/23 1450  Weight: 151 lb (68.5 kg)  Height: 5\' 6"  (1.676 m)   Body mass index is 24.37 kg/m.     09/04/2023    2:52 PM 04/30/2023    2:09 PM 09/02/2022    3:50 PM 01/17/2022   11:44 AM 08/30/2021    3:44 PM 08/27/2020   11:11 AM 09/22/2019   10:23 AM  Advanced Directives  Does Patient Have a Medical Advance Directive? No No No No Yes No No  Type of Agricultural consultant;Living will    Copy of Healthcare Power of Attorney in Chart?     No - copy requested    Would patient like information on creating a medical advance directive? Yes (MAU/Ambulatory/Procedural Areas - Information given)  No - Patient declined No - Guardian declined  No - Patient declined     Current Medications (verified) Outpatient Encounter Medications as of 09/04/2023  Medication Sig   Alum & Mag Hydroxide-Simeth (GI COCKTAIL) SUSP suspension Take 30 mLs by mouth at bedtime. Shake well.    benazepril (LOTENSIN) 40 MG tablet Take 1 tablet (40 mg total) by mouth at bedtime.   Cholecalciferol (VITAMIN D-3) 125 MCG (5000 UT) TABS Take 5,000 Units by mouth daily.   cloNIDine (CATAPRES) 0.1 MG tablet TAKE ONE TABLET TWICE DAILY   famotidine (PEPCID) 20 MG tablet Take 1 tablet (20 mg total) by mouth at bedtime.   feeding supplement, ENSURE ENLIVE, (ENSURE ENLIVE) LIQD Take 237 mLs by mouth 2 (two) times daily between meals.   fluticasone (FLONASE) 50 MCG/ACT nasal spray USE 2 SPRAYS IN EACH NOSTRIL ONCE DAILY.   furosemide (LASIX) 40 MG tablet Take 1 tablet (40 mg total) by mouth daily.   hydrALAZINE (APRESOLINE) 10 MG tablet Take 1 tablet (10 mg total) by mouth in the morning and at bedtime.   levothyroxine (SYNTHROID) 75 MCG tablet Take 1 tablet (75 mcg total) by mouth daily.   lovastatin (MEVACOR) 40 MG tablet Take 1 tablet (40 mg total) by mouth at bedtime.   Multiple Vitamin (MULTIVITAMIN WITH MINERALS) TABS tablet Take 1 tablet by mouth daily.   pantoprazole (PROTONIX) 40 MG tablet Take 1 tablet (40 mg total) by mouth daily.   potassium chloride SA (KLOR-CON M) 20 MEQ tablet Take 1 tablet (20 mEq total) by mouth daily.   predniSONE (STERAPRED UNI-PAK 21 TAB) 5 MG (21) TBPK tablet Take 6,5,4,3,2,1 one tablet less each day with food   raloxifene (EVISTA) 60 MG tablet Take 1 tablet (60 mg total) by mouth daily.  Rivaroxaban (XARELTO) 15 MG TABS tablet Take 1 tablet (15 mg total) by mouth daily with supper.   traMADol (ULTRAM) 50 MG tablet Take 1 tablet (50 mg total) by mouth 3 (three) times daily as needed.   No facility-administered encounter medications on file as of 09/04/2023.    Allergies (verified) Shellfish allergy, Amlodipine, Macrodantin, Metformin and related, and Penicillins   History: Past Medical History:  Diagnosis Date   Anxiety    Aortic insufficiency    a. Trivial AI by echo 02/2016.   Atrial fibrillation and flutter (HCC)    a. Coarse afib vs flutter by EKG  12/2015.   Cancer (HCC)    skin cancer   Cataract    Chronic diastolic CHF (congestive heart failure) (HCC)    CKD (chronic kidney disease), stage III (HCC)    COVID-19    GERD (gastroesophageal reflux disease)    Hiatal hernia    Hypercholesterolemia    Hypertension    Hypokalemia    Hypothyroidism    Left knee pain    NIDDM (non-insulin dependent diabetes mellitus)    diet controlled    Osteoporosis    Premature atrial contractions    PVC's (premature ventricular contractions)    Vitamin D deficiency    Past Surgical History:  Procedure Laterality Date   A-FLUTTER ABLATION N/A 02/08/2019   Procedure: A-FLUTTER ABLATION;  Surgeon: Hillis Range, MD;  Location: [facility];  Service: Cardiovascular;  Laterality: N/A;   ABDOMINAL HYSTERECTOMY     APPENDECTOMY  1980   BACK SURGERY     CATARACT EXTRACTION, BILATERAL     CHOLECYSTECTOMY  5/00   COLONOSCOPY     implantable loop recorder placement  04/06/2019   MDT Reveal LINQ [id number] ([id number]) implanted for evaluation of palpitations and afib post atrial flutter ablation by Dr Johney Frame in office   INTRAMEDULLARY (IM) NAIL INTERTROCHANTERIC Left 09/23/2019   Procedure: INTRAMEDULLARY (IM) NAIL INTERTROCHANTRIC;  Surgeon: Roby Lofts, MD;  Location: [facility] OR;  Service: Orthopedics;  Laterality: Left;   TONSILLECTOMY     TOTAL ABDOMINAL HYSTERECTOMY W/ BILATERAL SALPINGOOPHORECTOMY  1980   UPPER GASTROINTESTINAL ENDOSCOPY     Family History  Problem Relation Age of Onset   Diabetes Mother    Stroke Mother    Heart disease Father    Stroke Father    Uterine cancer Sister    Diabetes Sister    Ovarian cancer Sister    Colon cancer Sister    Diabetes Sister    Liver cancer Sister        \   Atrial fibrillation Sister    Diabetes Sister    Diabetes Brother    Dementia Brother    Diabetes Son    Healthy Son    Heart attack Neg Hx    Hypertension Neg Hx    Esophageal cancer Neg Hx    Rectal cancer Neg Hx    Stomach  cancer Neg Hx    Breast cancer Neg Hx    Social History   Socioeconomic History   Marital status: Widowed    Spouse name: Not on file   Number of children: 2   Years of education: Not on file   Highest education level: High school graduate  Occupational History   Occupation: Retired    Comment: Ambulance person , golf, farm   Tobacco Use   Smoking status: Never   Smokeless tobacco: Never  Vaping Use   Vaping status: Never Used  Substance and Sexual Activity   Alcohol use: No   Drug use: No   Sexual activity: Not Currently  Other Topics Concern   Not on file  Social History Narrative   Patient is widowed she has 2 children she used to work in Designer, fashion/clothing   One son in [place], another in [place]   Social Drivers of Health   Financial Resource Strain: Low Risk  (09/04/2023)   Overall Financial Resource Strain (CARDIA)    Difficulty of Paying Living Expenses: Not hard at all  Food Insecurity: No Food Insecurity (09/04/2023)   Hunger Vital Sign    Worried About Running Out of Food in the Last Year: Never true    Ran Out of Food in the Last Year: Never true  Transportation Needs: No Transportation Needs (09/04/2023)   PRAPARE - Administrator, Civil Service (Medical): No    Lack of Transportation (Non-Medical): No  Physical Activity: Inactive (09/04/2023)   Exercise Vital Sign    Days of Exercise per Week: 0 days    Minutes of Exercise per Session: 0 min  Stress: No Stress Concern Present (09/04/2023)   [employer] of Occupational Health - Occupational Stress Questionnaire    Feeling of Stress : Not at all  Social Connections: Socially Isolated (09/04/2023)   Social Connection and Isolation Panel [NHANES]    Frequency of Communication with Friends and Family: More than three times a week    Frequency of Social Gatherings with Friends and Family: Three times a week    Attends Religious Services: Never    Active Member of Clubs or Organizations: No     Attends Banker Meetings: Never    Marital Status: Widowed    Tobacco Counseling Counseling given: Not Answered   Clinical Intake:  Pre-visit preparation completed: Yes  Pain : No/denies pain     Diabetes: Yes CBG done?: No Did pt. bring in CBG monitor from home?: No  How often do you need to have someone help you when you read instructions, pamphlets, or other written materials from your doctor or pharmacy?: 1 - Never  Interpreter Needed?: No  Information entered by :: Kandis Fantasia LPN   Activities of Daily Living    09/04/2023    2:52 PM  In your present state of health, do you have any difficulty performing the following activities:  Hearing? 0  Vision? 0  Difficulty concentrating or making decisions? 0  Walking or climbing stairs? 0  Dressing or bathing? 0  Doing errands, shopping? 0  Preparing Food and eating ? N  Using the Toilet? N  In the past six months, have you accidently leaked urine? N  Do you have problems with loss of bowel control? N  Managing your Medications? N  Managing your Finances? N  Housekeeping or managing your Housekeeping? N    Patient Care Team: Bennie Pierini, FNP as PCP - General (Nurse Practitioner) Kathleene Hazel, MD as PCP - Cardiology (Cardiology) Lanier Prude, MD as PCP - Electrophysiology (Cardiology) Kathleene Hazel, MD as Consulting Physician (Cardiology) Vedia Coffer Audrie Lia (Physician Assistant) Kerrin Champagne, MD (Inactive) as Consulting Physician (Orthopedic Surgery) Derryl Harbor, OD (Optometry)  Indicate any recent Medical Services you may have received from other than [facility] providers in the past year (date may be approximate).     Assessment:   This is a routine wellness examination for Virla.  Hearing/Vision screen Hearing Screening - Comments:: Denies hearing difficulties  Vision Screening - Comments:: Wears rx glasses - up to date with routine eye exams with  Dr. Conley Rolls     Goals Addressed             This Visit's Progress    COMPLETED: AWV       08/27/2020 AWV Goal: Fall Prevention  Over the next year, patient will decrease their risk for falls by: Using assistive devices, such as a cane or walker, as needed Identifying fall risks within their home and correcting them by: Removing throw rugs Adding handrails to stairs or ramps Removing clutter and keeping a clear pathway throughout the home Increasing light, especially at night Adding shower handles/bars Raising toilet seat Identifying potential personal risk factors for falls: Medication side effects Incontinence/urgency Vestibular dysfunction Hearing loss Musculoskeletal disorders Neurological disorders Orthostatic hypotension  08/27/2020 AWV Goal: Diabetes Management  Patient will maintain an A1C level below 8.0 Patient will not develop any diabetic foot complications Patient will not experience any hypoglycemic episodes over the next 3 months Patient will notify our office of any CBG readings outside of the provider recommended range by calling [phone] Patient will adhere to provider recommendations for diabetes management  Patient Self Management Activities take all medications as prescribed and report any negative side effects monitor and record blood sugar readings as directed adhere to a low carbohydrate diet that incorporates lean proteins, vegetables, whole grains, low glycemic fruits check feet daily noting any sores, cracks, injuries, or callous formations see PCP or podiatrist if she notices any changes in her legs, feet, or toenails Patient will visit PCP and have an A1C level checked every 3 to 6 months as directed  have a yearly eye exam to monitor for vascular changes associated with diabetes and will request that the report be sent to her pcp.  consult with her PCP regarding any changes in her health or new or worsening symptoms       Depression  Screen    09/04/2023    2:51 PM 06/15/2023    2:02 PM 05/19/2023    3:29 PM 01/06/2023   10:57 AM 10/28/2022   12:13 PM 10/06/2022   10:47 AM 09/02/2022    3:48 PM  PHQ 2/9 Scores  PHQ - 2 Score 0 0 0 0 0 0 0  PHQ- 9 Score  0 0 0 0 0     Fall Risk    09/04/2023    2:52 PM 06/15/2023    2:02 PM 05/19/2023    3:29 PM 01/06/2023   10:57 AM 10/28/2022   12:13 PM  Fall Risk   Falls in the past year? 0 0 0 0 0  Number falls in past yr: 0      Injury with Fall? 0      Risk for fall due to : No Fall Risks      Follow up Falls prevention discussed;Education provided;Falls evaluation completed        MEDICARE RISK AT HOME: Medicare Risk at Home Any stairs in or around the home?: No If so, are there any without handrails?: No Home free of loose throw rugs in walkways, pet beds, electrical cords, etc?: Yes Adequate lighting in your home to reduce risk of falls?: Yes Life alert?: No Use of a cane, walker or w/c?: No Grab bars in the bathroom?: Yes Shower chair or bench in shower?: No Elevated toilet seat or a handicapped toilet?: Yes  TIMED UP AND GO:  Was the test performed?  No  Cognitive Function:    05/05/2018    3:34 PM 04/06/2017   10:13 AM 03/26/2016    9:27 AM  MMSE - Mini Mental State Exam  Orientation to time 5 4 5   Orientation to Place 5 5 5   Registration 3 3 3   Attention/ Calculation 5 5 5   Recall 3 2 2   Language- name 2 objects 2 2 2   Language- repeat 1 1 0  Language- follow 3 step command 3 3 3   Language- read & follow direction 1 1 1   Write a sentence 1 1 1   Copy design 1 1 0  Total score 30 28 27         09/04/2023    2:52 PM 09/02/2022    3:50 PM 08/27/2020   11:15 AM 05/09/2019    2:41 PM  6CIT Screen  What Year? 0 points 0 points 0 points 0 points  What month? 0 points 0 points 0 points 0 points  What time? 0 points 0 points 0 points 0 points  Count back from 20 0 points 0 points 0 points 0 points  Months in reverse 2 points 0 points 0 points 0  points  Repeat phrase 0 points 2 points 0 points 0 points  Total Score 2 points 2 points 0 points 0 points    Immunizations Immunization History  Administered Date(s) Administered   Fluad Quad(high Dose 65+) 04/22/2019, 06/29/2020   Influenza, High Dose Seasonal PF 04/27/2017, 06/01/2018   Influenza,inj,Quad PF,6+ Mos 05/09/2013, 05/18/2014, 04/17/2016   Influenza-Unspecified 04/28/2017   Moderna Sars-Covid-2 Vaccination 04/17/2020, 05/02/2020   Pneumococcal Conjugate-13 02/07/2015   Pneumococcal Polysaccharide-23 06/30/2012   Tdap 12/01/2011   Zoster Recombinant(Shingrix) 04/28/2017, 07/16/2017    TDAP status: Due, Education has been provided regarding the importance of this vaccine. Advised may receive this vaccine at local pharmacy or Health Dept. Aware to provide a copy of the vaccination record if obtained from local pharmacy or Health Dept. Verbalized acceptance and understanding.  Flu Vaccine status: Due, Education has been provided regarding the importance of this vaccine. Advised may receive this vaccine at local pharmacy or Health Dept. Aware to provide a copy of the vaccination record if obtained from local pharmacy or Health Dept. Verbalized acceptance and understanding.  Pneumococcal vaccine status: Up to date  Covid-19 vaccine status: Information provided on how to obtain vaccines.   Qualifies for Shingles Vaccine? Yes   Zostavax completed No   Shingrix Completed?: Yes  Screening Tests Health Maintenance  Topic Date Due   Colonoscopy  05/28/2017   COVID-19 Vaccine (3 - Moderna risk series) 02/27/2021   DTaP/Tdap/Td (2 - Td or Tdap) 11/30/2021   FOOT EXAM  04/05/2023   HEMOGLOBIN A1C  07/09/2023   INFLUENZA VACCINE  11/16/2023 (Originally 03/19/2023)   OPHTHALMOLOGY EXAM  10/01/2023   MAMMOGRAM  07/07/2024   Medicare Annual Wellness (AWV)  09/03/2024   DEXA SCAN  10/07/2024   Pneumonia Vaccine 73+ Years old  Completed   Zoster Vaccines- Shingrix  Completed    HPV VACCINES  Aged Out    Health Maintenance  Health Maintenance Due  Topic Date Due   Colonoscopy  05/28/2017   COVID-19 Vaccine (3 - Moderna risk series) 02/27/2021   DTaP/Tdap/Td (2 - Td or Tdap) 11/30/2021   FOOT EXAM  04/05/2023   HEMOGLOBIN A1C  07/09/2023    Colorectal cancer screening: No longer required.   Mammogram status: Completed 07/08/23. Repeat every year  Bone Density status: Completed 10/07/22. Results reflect: Bone density results:  OSTEOPOROSIS. Repeat every 2 years.  Lung Cancer Screening: (Low Dose CT Chest recommended if Age 82-80 years, 20 pack-year currently smoking OR have quit w/in 15years.) does not qualify.   Lung Cancer Screening Referral: n/a  Additional Screening:  Hepatitis C Screening: does not qualify  Vision Screening: Recommended annual ophthalmology exams for early detection of glaucoma and other disorders of the eye. Is the patient up to date with their annual eye exam?  Yes  Who is the provider or what is the name of the office in which the patient attends annual eye exams? Dr. Conley Rolls  If pt is not established with a provider, would they like to be referred to a provider to establish care? No .   Dental Screening: Recommended annual dental exams for proper oral hygiene  Diabetic Foot Exam: Diabetic Foot Exam: Overdue, Pt has been advised about the importance in completing this exam. Pt is scheduled for diabetic foot exam on at next office visit.  Community Resource Referral / Chronic Care Management: CRR required this visit?  No   CCM required this visit?  No     Plan:     I have personally reviewed and noted the following in the patient's chart:   Medical and social history Use of alcohol, tobacco or illicit drugs  Current medications and supplements including opioid prescriptions. Patient is not currently taking opioid prescriptions. Functional ability and status Nutritional status Physical activity Advanced directives List of  other physicians Hospitalizations, surgeries, and ER visits in previous 12 months Vitals Screenings to include cognitive, depression, and falls Referrals and appointments  In addition, I have reviewed and discussed with patient certain preventive protocols, quality metrics, and best practice recommendations. A written personalized care plan for preventive services as well as general preventive health recommendations were provided to patient.     Kandis Fantasia [place], [place]   6/57/8469   After Visit Summary: (MyChart) Due to this being a telephonic visit, the after visit summary with patients personalized plan was offered to patient via MyChart   Nurse Notes: No concerns at this time

## 2023-09-08 ENCOUNTER — Other Ambulatory Visit: Payer: Self-pay | Admitting: Physician Assistant

## 2023-09-10 ENCOUNTER — Other Ambulatory Visit: Payer: Self-pay

## 2023-09-10 ENCOUNTER — Other Ambulatory Visit: Payer: Self-pay | Admitting: Physician Assistant

## 2023-09-10 ENCOUNTER — Telehealth: Payer: Self-pay | Admitting: Nurse Practitioner

## 2023-09-10 DIAGNOSIS — H04123 Dry eye syndrome of bilateral lacrimal glands: Secondary | ICD-10-CM | POA: Diagnosis not present

## 2023-09-10 DIAGNOSIS — H40033 Anatomical narrow angle, bilateral: Secondary | ICD-10-CM | POA: Diagnosis not present

## 2023-09-10 DIAGNOSIS — M545 Low back pain, unspecified: Secondary | ICD-10-CM

## 2023-09-10 NOTE — Telephone Encounter (Unsigned)
Copied from CRM [id number]. Topic: Clinical - Medication Refill >> Sep 10, 2023  8:53 AM Ivette P wrote: Most Recent Primary Care Visit:  Provider: Anthoney Harada  Department: [facility]  Visit Type: MEDICARE AWV, SEQUENTIAL  Date: 09/04/2023  Medication: hydrALAZINE (APRESOLINE) 10 MG tablet  Has the patient contacted their pharmacy? Yes (Agent: If no, request that the patient contact the pharmacy for the refill. If patient does not wish to contact the pharmacy document the reason why and proceed with request.) (Agent: If yes, when and what did the pharmacy advise?)  Is this the correct pharmacy for this prescription? Yes If no, delete pharmacy and type the correct one.  This is the patient's preferred pharmacy:  [facility] [place], [place] - [address] [place] [address] Phone: [phone] Fax: [phone]    Has the prescription been filled recently? No  Is the patient out of the medication? Yes, has 1 pill left  Has the patient been seen for an appointment in the last year OR does the patient have an upcoming appointment? Yes  Can we respond through MyChart? No  Agent: Please be advised that Rx refills may take up to 3 business days. We ask that you follow-up with your pharmacy.

## 2023-09-10 NOTE — Telephone Encounter (Signed)
Rx filled today by cardiology. Pharmacy confirmed receipt

## 2023-09-22 NOTE — Progress Notes (Signed)
  Electrophysiology Office Follow up Visit Note:    Date:  09/23/2023   ID:  Katie [name], DOB 1931/11/03, MRN [id number]  PCP:  Gladis Mustard, FNP  [facility] Cardiologist:  Lonni Cash, MD  [facility] Electrophysiologist:  OLE ONEIDA HOLTS, MD    Interval History:     Katie [name] is a 89 y.o. female who presents for a follow up visit.   I last saw the patient in Dec 30, 2022.  She has a history of hyperlipidemia, diabetes, CKD, atrial flutter post ablation, atrial fibrillation and hypothyroidism.  Dr. Kelsie implanted a loop recorder and at the last appointment it had already reached RRT.  At the last appointment the patient decided to not replace the device.  Today she is decided to go ahead and move forward with a loop recorder explant and replacement.  Today she is doing well.  She reports intermittent episodes of atrial fibrillation.  These episodes are short-lived and typically resolve with rest.  She continues to take Xarelto  without bleeding issues.  She is interested in avoiding any invasive procedures including loop recorder/replacement if at all possible.         Past medical, surgical, social and family history were reviewed.  ROS:   Please see the history of present illness.    All other systems reviewed and are negative.  EKGs/Labs/Other Studies Reviewed:    The following studies were reviewed today:          Physical Exam:    VS:  BP (!) 200/88   Pulse 90   Ht 5' 6 (1.676 m)   Wt 151 lb 3.2 oz (68.6 kg)   SpO2 92%   BMI 24.40 kg/m      Recheck blood pressure 180/80.  Wt Readings from Last 3 Encounters:  09/23/23 151 lb 3.2 oz (68.6 kg)  09/04/23 151 lb (68.5 kg)  09/01/23 151 lb (68.5 kg)     GEN: no distress CARD: RRR, No MRG RESP: No IWOB. CTAB.      ASSESSMENT:    1. Tachycardia-bradycardia syndrome (HCC)   2. Persistent atrial fibrillation (HCC)   3. Primary hypertension    PLAN:    In order of  problems listed above:  #Persistent atrial fibrillation #Tachybradycardia syndrome The patient has a long history of atrial fibrillation complicated by tachybradycardia syndrome.  She has a loop recorder in place for monitoring.  She takes Xarelto  for stroke prophylaxis.  Her loop recorder is at end of service.  She is not interested in replacement.  Continue Xarelto  for stroke prophylaxis.  #Hypertension Above goal today.  Recommend checking blood pressures 1-2 times per week at home and recording the values.  Recommend bringing these recordings to the primary care physician. I suspect she has whitecoat hypertension given her home blood pressures in the 1 20-1 40 range systolic.  Follow-up 1 year with APP  Signed, Ole Holts, MD, [facility] 09/23/2023 1:18 PM    Electrophysiology [facility]

## 2023-09-23 ENCOUNTER — Ambulatory Visit: Payer: Medicare Other | Attending: Cardiology | Admitting: Cardiology

## 2023-09-23 VITALS — BP 200/88 | HR 90 | Ht 66.0 in | Wt 151.2 lb

## 2023-09-23 DIAGNOSIS — I10 Essential (primary) hypertension: Secondary | ICD-10-CM

## 2023-09-23 DIAGNOSIS — I4819 Other persistent atrial fibrillation: Secondary | ICD-10-CM | POA: Diagnosis not present

## 2023-09-23 DIAGNOSIS — I495 Sick sinus syndrome: Secondary | ICD-10-CM | POA: Diagnosis not present

## 2023-09-23 NOTE — Patient Instructions (Signed)
 Medication Instructions:  Your physician recommends that you continue on your current medications as directed. Please refer to the Current Medication list given to you today.  *If you need a refill on your cardiac medications before your next appointment, please call your pharmacy*  Follow-Up: At [facility], you and your health needs are our priority.  As part of our continuing mission to provide you with exceptional heart care, we have created designated Provider Care Teams.  These Care Teams include your primary Cardiologist (physician) and Advanced Practice Providers (APPs -  Physician Assistants and Nurse Practitioners) who all work together to provide you with the care you need, when you need it.  Your next appointment:   1 year  Provider:   You will see one of the following Advanced Practice Providers on your designated Care Team:   Francis Dowse, Charlott Holler [address] [place], [place] Sherie Don, NP Canary Brim, NP

## 2023-09-24 ENCOUNTER — Ambulatory Visit: Payer: Medicare Other | Admitting: Nurse Practitioner

## 2023-09-24 ENCOUNTER — Encounter: Payer: Self-pay | Admitting: Nurse Practitioner

## 2023-09-24 DIAGNOSIS — M545 Low back pain, unspecified: Secondary | ICD-10-CM

## 2023-09-24 DIAGNOSIS — G8929 Other chronic pain: Secondary | ICD-10-CM

## 2023-09-24 MED ORDER — PREDNISONE 20 MG PO TABS: ORAL_TABLET | ORAL | 0 refills | Status: DC

## 2023-09-24 MED ORDER — METHYLPREDNISOLONE ACETATE 80 MG/ML IJ SUSP
80.0000 mg | Freq: Once | INTRAMUSCULAR | Status: AC
  Administered 2023-09-24: 80 mg via INTRAMUSCULAR

## 2023-09-24 MED ORDER — FLUTICASONE PROPIONATE 50 MCG/ACT NA SUSP: 2.0000 | Freq: Every day | NASAL | 6 refills | Status: AC

## 2023-09-24 MED ORDER — TRAMADOL HCL 50 MG PO TABS: 50.0000 mg | ORAL_TABLET | Freq: Three times a day (TID) | ORAL | 2 refills | Status: DC | PRN

## 2023-09-24 NOTE — Progress Notes (Signed)
 Subjective:    Patient ID: Katie [name], female    DOB: March 05, 1932, 89 y.o.   MRN: [id number]   Chief Complaint: chronic back pain  Pain assessment: Cause of pain- DDD Pain location- lower back Pain on scale of 1-10- 10/10 currently Frequency- daily What increases pain-to much activity What makes pain Better-rest helps Effects on ADL - none Any change in general medical condition-none  Current opioids rx- ultram  50 # meds rx- 60 Effectiveness of current meds-helps Adverse reactions from pain meds-none Morphine  equivalent-  Pill count performed-Yes Last drug screen - 6/1/423 ( high risk q60m, moderate risk q67m, low risk yearly ) Urine drug screen today- No Was the NCCSR reviewed- yes  If yes were their any concerning findings? - no   Overdose risk: 1    Pain contract signed on: 01/31/22      Review of Systems  Constitutional:  Negative for diaphoresis.  Eyes:  Negative for pain.  Respiratory:  Negative for shortness of breath.   Cardiovascular:  Negative for chest pain, palpitations and leg swelling.  Gastrointestinal:  Negative for abdominal pain.  Endocrine: Negative for polydipsia.  Skin:  Negative for rash.  Neurological:  Negative for dizziness, weakness and headaches.  Hematological:  Does not bruise/bleed easily.  All other systems reviewed and are negative.      Objective:   Physical Exam Vitals and nursing note reviewed.  Constitutional:      General: She is not in acute distress.    Appearance: Normal appearance. She is well-developed.  HENT:     Head: Normocephalic.     Right Ear: Tympanic membrane normal.     Left Ear: Tympanic membrane normal.     Nose: Nose normal.     Mouth/Throat:     Mouth: Mucous membranes are moist.  Eyes:     Pupils: Pupils are equal, round, and reactive to light.  Neck:     Vascular: No carotid bruit or JVD.  Cardiovascular:     Rate and Rhythm: Normal rate and regular rhythm.     Heart sounds:  Normal heart sounds.  Pulmonary:     Effort: Pulmonary effort is normal. No respiratory distress.     Breath sounds: Normal breath sounds. No wheezing or rales.  Chest:     Chest wall: No tenderness.  Abdominal:     General: Bowel sounds are normal. There is no distension or abdominal bruit.     Palpations: Abdomen is soft. There is no hepatomegaly, splenomegaly, mass or pulsatile mass.     Tenderness: There is no abdominal tenderness.  Musculoskeletal:        General: Normal range of motion.     Cervical back: Normal range of motion and neck supple.  Lymphadenopathy:     Cervical: No cervical adenopathy.  Skin:    General: Skin is warm and dry.  Neurological:     Mental Status: She is alert and oriented to person, place, and time.     Deep Tendon Reflexes: Reflexes are normal and symmetric.  Psychiatric:        Behavior: Behavior normal.        Thought Content: Thought content normal.        Judgment: Judgment normal.     BP (!) 186/84   Pulse 85   Temp (!) 97.4 F (36.3 C) (Temporal)   Ht 5' 6 (1.676 m)   Wt 151 lb (68.5 kg)   SpO2 96%   BMI 24.37 kg/m  Assessment & Plan:  Katie [name] in today with chief complaint of Back Pain   1. Chronic midline low back pain without sciatica Moist heat Rest  RTO prn - traMADol  (ULTRAM ) 50 MG tablet; Take 1 tablet (50 mg total) by mouth 3 (three) times daily as needed.  Dispense: 90 tablet; Refill: 2 - methylPREDNISolone  acetate (DEPO-MEDROL ) injection 80 mg - predniSONE  (DELTASONE ) 20 MG tablet; 2 po at sametime daily for 5 days-  Dispense: 10 tablet; Refill: 0    The above assessment and management plan was discussed with the patient. The patient verbalized understanding of and has agreed to the management plan. Patient is aware to call the clinic if symptoms persist or worsen. Patient is aware when to return to the clinic for a follow-up visit. Patient educated on when it is appropriate to go to the  emergency department.   Mary-Margaret Gladis, FNP

## 2023-09-24 NOTE — Patient Instructions (Signed)

## 2023-10-19 ENCOUNTER — Other Ambulatory Visit: Payer: Self-pay | Admitting: Nurse Practitioner

## 2023-11-17 ENCOUNTER — Ambulatory Visit (INDEPENDENT_AMBULATORY_CARE_PROVIDER_SITE_OTHER): Payer: Medicare Other | Admitting: Nurse Practitioner

## 2023-11-17 ENCOUNTER — Encounter: Payer: Self-pay | Admitting: Nurse Practitioner

## 2023-11-17 VITALS — BP 140/59 | HR 56 | Temp 97.9°F | Ht 66.0 in | Wt 151.0 lb

## 2023-11-17 DIAGNOSIS — E039 Hypothyroidism, unspecified: Secondary | ICD-10-CM | POA: Diagnosis not present

## 2023-11-17 DIAGNOSIS — M8580 Other specified disorders of bone density and structure, unspecified site: Secondary | ICD-10-CM

## 2023-11-17 DIAGNOSIS — F5101 Primary insomnia: Secondary | ICD-10-CM

## 2023-11-17 DIAGNOSIS — I5032 Chronic diastolic (congestive) heart failure: Secondary | ICD-10-CM

## 2023-11-17 DIAGNOSIS — E785 Hyperlipidemia, unspecified: Secondary | ICD-10-CM | POA: Diagnosis not present

## 2023-11-17 DIAGNOSIS — E1169 Type 2 diabetes mellitus with other specified complication: Secondary | ICD-10-CM

## 2023-11-17 DIAGNOSIS — E119 Type 2 diabetes mellitus without complications: Secondary | ICD-10-CM

## 2023-11-17 DIAGNOSIS — E876 Hypokalemia: Secondary | ICD-10-CM | POA: Diagnosis not present

## 2023-11-17 DIAGNOSIS — Z0001 Encounter for general adult medical examination with abnormal findings: Secondary | ICD-10-CM | POA: Diagnosis not present

## 2023-11-17 DIAGNOSIS — R600 Localized edema: Secondary | ICD-10-CM

## 2023-11-17 DIAGNOSIS — K219 Gastro-esophageal reflux disease without esophagitis: Secondary | ICD-10-CM | POA: Diagnosis not present

## 2023-11-17 DIAGNOSIS — I4819 Other persistent atrial fibrillation: Secondary | ICD-10-CM | POA: Diagnosis not present

## 2023-11-17 DIAGNOSIS — N1832 Chronic kidney disease, stage 3b: Secondary | ICD-10-CM

## 2023-11-17 DIAGNOSIS — I10 Essential (primary) hypertension: Secondary | ICD-10-CM

## 2023-11-17 DIAGNOSIS — M545 Low back pain, unspecified: Secondary | ICD-10-CM | POA: Diagnosis not present

## 2023-11-17 LAB — BAYER DCA HB A1C WAIVED: HB A1C (BAYER DCA - WAIVED): 7.2 % — ABNORMAL HIGH (ref 4.8–5.6)

## 2023-11-17 MED ORDER — CLONIDINE HCL 0.1 MG PO TABS: 0.1000 mg | ORAL_TABLET | Freq: Two times a day (BID) | ORAL | 1 refills | Status: DC

## 2023-11-17 MED ORDER — METHYLPREDNISOLONE ACETATE 80 MG/ML IJ SUSP
80.0000 mg | Freq: Once | INTRAMUSCULAR | Status: AC
  Administered 2023-11-17: 80 mg via INTRAMUSCULAR

## 2023-11-17 MED ORDER — RALOXIFENE HCL 60 MG PO TABS: 60.0000 mg | ORAL_TABLET | Freq: Every day | ORAL | 1 refills | Status: DC

## 2023-11-17 MED ORDER — TRAMADOL HCL 50 MG PO TABS
50.0000 mg | ORAL_TABLET | Freq: Three times a day (TID) | ORAL | 2 refills | Status: DC | PRN
Start: 2023-11-17 — End: 2024-02-15

## 2023-11-17 MED ORDER — BENAZEPRIL HCL 40 MG PO TABS: 40.0000 mg | ORAL_TABLET | Freq: Every day | ORAL | 1 refills | Status: DC

## 2023-11-17 MED ORDER — GI COCKTAIL ~~LOC~~: 30.0000 mL | Freq: Every day | ORAL | 2 refills | Status: DC

## 2023-11-17 MED ORDER — FUROSEMIDE 40 MG PO TABS: 40.0000 mg | ORAL_TABLET | Freq: Every day | ORAL | 1 refills | Status: DC

## 2023-11-17 MED ORDER — PREDNISONE 20 MG PO TABS: ORAL_TABLET | ORAL | 0 refills | Status: DC

## 2023-11-17 MED ORDER — POTASSIUM CHLORIDE CRYS ER 20 MEQ PO TBCR: 20.0000 meq | EXTENDED_RELEASE_TABLET | Freq: Every day | ORAL | 1 refills | Status: DC

## 2023-11-17 MED ORDER — LEVOTHYROXINE SODIUM 75 MCG PO TABS: 75.0000 ug | ORAL_TABLET | Freq: Every day | ORAL | 1 refills | Status: DC

## 2023-11-17 MED ORDER — RIVAROXABAN 15 MG PO TABS: 15.0000 mg | ORAL_TABLET | Freq: Every day | ORAL | 1 refills | Status: DC

## 2023-11-17 MED ORDER — PANTOPRAZOLE SODIUM 40 MG PO TBEC: 40.0000 mg | DELAYED_RELEASE_TABLET | Freq: Every day | ORAL | 1 refills | Status: DC

## 2023-11-17 MED ORDER — LOVASTATIN 40 MG PO TABS: 40.0000 mg | ORAL_TABLET | Freq: Every day | ORAL | 1 refills | Status: DC

## 2023-11-17 NOTE — Progress Notes (Signed)
 Subjective:    Patient ID: Katie [name], female    DOB: 07/17/1932, 89 y.o.   MRN: [id number]   Chief Complaint: annual physical    HPI:  Katie [name] is a 89 y.o. who identifies as a female who was assigned female at birth.   Social history: Lives with: by herself- family checks on her daily Work history: retired   Water engineer in today for follow up of the following chronic medical issues:  1. Primary hypertension No c/o chest pain, sob or headache. Does not check blood pressure at home. BP Readings from Last 3 Encounters:  09/24/23 (!) 186/84  09/23/23 (!) 200/88  09/01/23 (!) 163/71     2. Chronic diastolic CHF (congestive heart failure) (HCC) 3. Persistent atrial fibrillation (HCC) Is on xeralto with no bleeding issues. Denies palpitations or feeling that heart racing. Last saw cardiology on 12/30/22. No changes made to plan of care.  4. Gastroesophageal reflux disease without esophagitis Is on combination of pepcid and protonix- works well for her.  5. Diabetes mellitus type 2, diet-controlled (HCC) She does not check her blood sugars at home. Lab Results  Component Value Date   HGBA1C 6.7 (H) 01/06/2023     6. Acquired hypothyroidism No issues that she is aware of. Lab Results  Component Value Date   TSH 3.080 10/06/2022     7. Stage 3b chronic kidney disease (HCC) No voiding issues Lab Results  Component Value Date   CREATININE 1.22 (H) 01/06/2023     8. Hyperlipidemia associated with type 2 diabetes mellitus (HCC) Watches diet but does no exercise. Lab Results  Component Value Date   CHOL 149 01/06/2023   HDL 74 01/06/2023   LDLCALC 59 01/06/2023   TRIG 82 01/06/2023   CHOLHDL 2.0 01/06/2023     9. Hypokalemia No muscle cramping Lab Results  Component Value Date   K 4.5 01/06/2023     10. Hypomagnesemia Last magnesium level was 2.1   11. Peripheral edema Has some daily by the end of the day  12. Primary insomnia Is currently  on no sleep aids  13. Chronic back pain Pain assessment: She has been out of pain meds and s really hurting right ow. Would like steroids. Cause of pain- radiculopathy Pain location- lower back --Pain on scale of 1-10- 7-8/10 Frequency- daily What increases pain-nothing really What makes pain Better-is currently getting physical therapy Effects on ADL - none Any change in general medical condition-no  Current opioids rx- ultramTID # meds rx- 90 Effectiveness of current meds-helps Adverse reactions from pain meds-none Morphine equivalent- 15 MME  Pill count performed-No Last drug screen - 01/29/22 ( high risk q58m, moderate risk q46m, low risk yearly ) Urine drug screen today- Yes Was the NCCSR reviewed- yes  If yes were their any concerning findings? - no   Overdose risk: 1   Pain contract signed on: 01/31/22  New complaints: Severe back pain today . Has been worsening for several weeks. Steroid shots help.  Allergies  Allergen Reactions   Shellfish Allergy Other (See Comments)   Amlodipine Swelling   Macrodantin Nausea And Vomiting   Metformin And Related Nausea And Vomiting and Other (See Comments)    Bloating   Penicillins Rash    Did it involve swelling of the face/tongue/throat, SOB, or low BP? Unknown Did it involve sudden or severe rash/hives, skin peeling, or any reaction on the inside of your mouth or nose? Yes Did you need to seek  medical attention at a hospital or doctor's office? Yes When did it last happen? 2005  If all above answers are "NO", may proceed with cephalosporin use.    Outpatient Encounter Medications as of 11/17/2023  Medication Sig   Alum & Mag Hydroxide-Simeth (GI COCKTAIL) SUSP suspension Take 30 mLs by mouth at bedtime. Shake well.   benazepril (LOTENSIN) 40 MG tablet Take 1 tablet (40 mg total) by mouth at bedtime.   Cholecalciferol (VITAMIN D-3) 125 MCG (5000 UT) TABS Take 5,000 Units by mouth daily.   cloNIDine (CATAPRES) 0.1 MG  tablet TAKE ONE TABLET TWICE DAILY   famotidine (PEPCID) 20 MG tablet Take 1 tablet (20 mg total) by mouth at bedtime.   feeding supplement, ENSURE ENLIVE, (ENSURE ENLIVE) LIQD Take 237 mLs by mouth 2 (two) times daily between meals.   fluticasone (FLONASE) 50 MCG/ACT nasal spray Place 2 sprays into both nostrils daily.   furosemide (LASIX) 40 MG tablet Take 1 tablet (40 mg total) by mouth daily.   hydrALAZINE (APRESOLINE) 10 MG tablet TAKE ONE TABLET EVERY MORNING AND AT BEDTIME   levothyroxine (SYNTHROID) 75 MCG tablet Take 1 tablet (75 mcg total) by mouth daily.   lovastatin (MEVACOR) 40 MG tablet Take 1 tablet (40 mg total) by mouth at bedtime.   Multiple Vitamin (MULTIVITAMIN WITH MINERALS) TABS tablet Take 1 tablet by mouth daily.   pantoprazole (PROTONIX) 40 MG tablet Take 1 tablet (40 mg total) by mouth daily.   potassium chloride SA (KLOR-CON M) 20 MEQ tablet Take 1 tablet (20 mEq total) by mouth daily.   predniSONE (DELTASONE) 20 MG tablet 2 po at sametime daily for 5 days-   raloxifene (EVISTA) 60 MG tablet Take 1 tablet (60 mg total) by mouth daily.   Rivaroxaban (XARELTO) 15 MG TABS tablet Take 1 tablet (15 mg total) by mouth daily with supper.   traMADol (ULTRAM) 50 MG tablet Take 1 tablet (50 mg total) by mouth 3 (three) times daily as needed.   No facility-administered encounter medications on file as of 11/17/2023.    Past Surgical History:  Procedure Laterality Date   A-FLUTTER ABLATION N/A 02/08/2019   Procedure: A-FLUTTER ABLATION;  Surgeon: Hillis Range, MD;  Location: [facility];  Service: Cardiovascular;  Laterality: N/A;   ABDOMINAL HYSTERECTOMY     APPENDECTOMY  1980   BACK SURGERY     CATARACT EXTRACTION, BILATERAL     CHOLECYSTECTOMY  5/00   COLONOSCOPY     implantable loop recorder placement  04/06/2019   MDT Reveal LINQ [id number] ([id number]) implanted for evaluation of palpitations and afib post atrial flutter ablation by Dr Johney Frame in office    INTRAMEDULLARY (IM) NAIL INTERTROCHANTERIC Left 09/23/2019   Procedure: INTRAMEDULLARY (IM) NAIL INTERTROCHANTRIC;  Surgeon: Roby Lofts, MD;  Location: [facility] OR;  Service: Orthopedics;  Laterality: Left;   TONSILLECTOMY     TOTAL ABDOMINAL HYSTERECTOMY W/ BILATERAL SALPINGOOPHORECTOMY  1980   UPPER GASTROINTESTINAL ENDOSCOPY      Family History  Problem Relation Age of Onset   Diabetes Mother    Stroke Mother    Heart disease Father    Stroke Father    Uterine cancer Sister    Diabetes Sister    Ovarian cancer Sister    Colon cancer Sister    Diabetes Sister    Liver cancer Sister        \   Atrial fibrillation Sister    Diabetes Sister    Diabetes Brother  Dementia Brother    Diabetes Son    Healthy Son    Heart attack Neg Hx    Hypertension Neg Hx    Esophageal cancer Neg Hx    Rectal cancer Neg Hx    Stomach cancer Neg Hx    Breast cancer Neg Hx       Controlled substance contract: n/a     Review of Systems  Constitutional:  Negative for diaphoresis.  Eyes:  Negative for pain.  Respiratory:  Negative for shortness of breath.   Cardiovascular:  Negative for chest pain, palpitations and leg swelling.  Gastrointestinal:  Negative for abdominal pain.  Endocrine: Negative for polydipsia.  Skin:  Negative for rash.  Neurological:  Negative for dizziness, weakness and headaches.  Hematological:  Does not bruise/bleed easily.  All other systems reviewed and are negative.      Objective:   Physical Exam Vitals and nursing note reviewed.  Constitutional:      General: She is not in acute distress.    Appearance: Normal appearance. She is well-developed.  HENT:     Head: Normocephalic.     Right Ear: Tympanic membrane normal.     Left Ear: Tympanic membrane normal.     Nose: Nose normal.     Mouth/Throat:     Mouth: Mucous membranes are moist.  Eyes:     Pupils: Pupils are equal, round, and reactive to light.  Neck:     Vascular: No carotid bruit or  JVD.  Cardiovascular:     Rate and Rhythm: Normal rate and regular rhythm.     Heart sounds: Normal heart sounds.  Pulmonary:     Effort: Pulmonary effort is normal. No respiratory distress.     Breath sounds: Normal breath sounds. No wheezing or rales.  Chest:     Chest wall: No tenderness.  Abdominal:     General: Bowel sounds are normal. There is no distension or abdominal bruit.     Palpations: Abdomen is soft. There is no hepatomegaly, splenomegaly, mass or pulsatile mass.     Tenderness: There is no abdominal tenderness.  Musculoskeletal:        General: Normal range of motion.     Cervical back: Normal range of motion and neck supple.     Comments: Rises slowly from sitting to standing Pain mainly when walking (-) SLR bil  Lymphadenopathy:     Cervical: No cervical adenopathy.  Skin:    General: Skin is warm and dry.  Neurological:     Mental Status: She is alert and oriented to person, place, and time.     Deep Tendon Reflexes: Reflexes are normal and symmetric.  Psychiatric:        Behavior: Behavior normal.        Thought Content: Thought content normal.        Judgment: Judgment normal.    BP (!) 140/59   Pulse (!) 56   Temp 97.9 F (36.6 C) (Temporal)   Ht 5\' 6"  (1.676 m)   Wt 151 lb (68.5 kg)   SpO2 96%   BMI 24.37 kg/m           Assessment & Plan:   Katie [name] comes in today with chief complaint of   Diagnosis and orders addressed:  1. Primary hypertension Low sodium diet - CBC with Differential/Platelet - CMP14+EGFR - benazepril (LOTENSIN) 40 MG tablet; Take 1 tablet (40 mg total) by mouth at bedtime.  Dispense: 90 tablet; Refill: 1  2. Chronic diastolic CHF (congestive heart failure) (HCC) 3. Persistent atrial fibrillation (HCC) Keep follow up with cardiology - Rivaroxaban (XARELTO) 15 MG TABS tablet; Take 1 tablet (15 mg total) by mouth daily with supper.  Dispense: 90 tablet; Refill: 1  4. Gastroesophageal reflux disease without  esophagitis Avoid spicy foods Do not eat 2 hours prior to bedtime - famotidine (PEPCID) 20 MG tablet; Take 1 tablet (20 mg total) by mouth at bedtime.  Dispense: 90 tablet; Refill: 1 - pantoprazole (PROTONIX) 40 MG tablet; Take 1 tablet (40 mg total) by mouth daily.  Dispense: 90 tablet; Refill: 1  5. Diabetes mellitus type 2, diet-controlled (HCC) Continue to watch carbs  6. Acquired hypothyroidism Labs pending - Thyroid Panel With TSH - levothyroxine (SYNTHROID) 75 MCG tablet; Take 1 tablet (75 mcg total) by mouth daily.  Dispense: 90 tablet; Refill: 1  7. Stage 3b chronic kidney disease (HCC) Labs pending  8. Hyperlipidemia associated with type 2 diabetes mellitus (HCC) Low fat diet - Lipid panel - lovastatin (MEVACOR) 40 MG tablet; Take 1 tablet (40 mg total) by mouth at bedtime.  Dispense: 90 tablet; Refill: 1  9. Hypokalemia Labs pending - potassium chloride SA (KLOR-CON M) 20 MEQ tablet; Take 1 tablet (20 mEq total) by mouth daily.  Dispense: 90 tablet; Refill: 1  10. Hypomagnesemia Labs pending  11. Peripheral edema Elevate legs when siting - furosemide (LASIX) 40 MG tablet; Take 1 tablet (40 mg total) by mouth daily.  Dispense: 90 tablet; Refill: 1  12. Primary insomnia Bedtime routine  13. Chronic midline low back pain without sciatica Moist heat Continue physical therapy - traMADol (ULTRAM) 50 MG tablet; Take 1 tablet (50 mg total) by mouth 3 (three) times daily as needed.  Dispense: 90 tablet; Refill: 2 - ToxASSURE Select 13 (MW), Urine - ToxASSURE Select 13 (MW), Urine - methylPREDNISolone acetate (DEPO-MEDROL) injection 80 mg - predniSONE (DELTASONE) 20 MG tablet; 2 po at sametime daily for 5 days-  Dispense: 10 tablet; Refill: 0  Prednisone shot today with steroids 20mg   2 at same time daily for 5 days Labs pending Health Maintenance reviewed Diet and exercise encouraged  Follow up plan: 6 months   Mary-Margaret Daphine Deutscher, FNP

## 2023-11-17 NOTE — Patient Instructions (Signed)

## 2023-11-18 LAB — CBC WITH DIFFERENTIAL/PLATELET
Basophils Absolute: 0 10*3/uL (ref 0.0–0.2)
Basos: 0 %
EOS (ABSOLUTE): 0.2 10*3/uL (ref 0.0–0.4)
Eos: 2 %
Hematocrit: 36.1 % (ref 34.0–46.6)
Hemoglobin: 12.1 g/dL (ref 11.1–15.9)
Immature Grans (Abs): 0 10*3/uL (ref 0.0–0.1)
Immature Granulocytes: 1 %
Lymphocytes Absolute: 2.5 10*3/uL (ref 0.7–3.1)
Lymphs: 31 %
MCH: 34.1 pg — ABNORMAL HIGH (ref 26.6–33.0)
MCHC: 33.5 g/dL (ref 31.5–35.7)
MCV: 102 fL — ABNORMAL HIGH (ref 79–97)
Monocytes Absolute: 0.6 10*3/uL (ref 0.1–0.9)
Monocytes: 7 %
Neutrophils Absolute: 4.8 10*3/uL (ref 1.4–7.0)
Neutrophils: 59 %
Platelets: 234 10*3/uL (ref 150–450)
RBC: 3.55 x10E6/uL — ABNORMAL LOW (ref 3.77–5.28)
RDW: 12.2 % (ref 11.7–15.4)
WBC: 8.1 10*3/uL (ref 3.4–10.8)

## 2023-11-18 LAB — THYROID PANEL WITH TSH
Free Thyroxine Index: 2.3 (ref 1.2–4.9)
T3 Uptake Ratio: 27 % (ref 24–39)
T4, Total: 8.5 ug/dL (ref 4.5–12.0)
TSH: 4.78 u[IU]/mL — ABNORMAL HIGH (ref 0.450–4.500)

## 2023-11-18 LAB — CMP14+EGFR
ALT: 18 IU/L (ref 0–32)
AST: 20 IU/L (ref 0–40)
Albumin: 4.2 g/dL (ref 3.6–4.6)
Alkaline Phosphatase: 67 IU/L (ref 44–121)
BUN/Creatinine Ratio: 17 (ref 12–28)
BUN: 17 mg/dL (ref 10–36)
Bilirubin Total: 0.3 mg/dL (ref 0.0–1.2)
CO2: 21 mmol/L (ref 20–29)
Calcium: 9.2 mg/dL (ref 8.7–10.3)
Chloride: 96 mmol/L (ref 96–106)
Creatinine, Ser: 1.02 mg/dL — ABNORMAL HIGH (ref 0.57–1.00)
Globulin, Total: 2.2 g/dL (ref 1.5–4.5)
Glucose: 131 mg/dL — ABNORMAL HIGH (ref 70–99)
Potassium: 4.2 mmol/L (ref 3.5–5.2)
Sodium: 137 mmol/L (ref 134–144)
Total Protein: 6.4 g/dL (ref 6.0–8.5)
eGFR: 52 mL/min/{1.73_m2} — ABNORMAL LOW (ref >59)

## 2023-11-18 LAB — LIPID PANEL
Chol/HDL Ratio: 2.7 ratio (ref 0.0–4.4)
Cholesterol, Total: 165 mg/dL (ref 100–199)
HDL: 62 mg/dL (ref >39)
LDL Chol Calc (NIH): 78 mg/dL (ref 0–99)
Triglycerides: 144 mg/dL (ref 0–149)
VLDL Cholesterol Cal: 25 mg/dL (ref 5–40)

## 2024-02-15 ENCOUNTER — Encounter: Payer: Self-pay | Admitting: Nurse Practitioner

## 2024-02-15 ENCOUNTER — Ambulatory Visit (INDEPENDENT_AMBULATORY_CARE_PROVIDER_SITE_OTHER): Admitting: Nurse Practitioner

## 2024-02-15 VITALS — BP 194/80 | HR 67 | Temp 98.1°F | Ht 66.0 in | Wt 150.0 lb

## 2024-02-15 DIAGNOSIS — M545 Low back pain, unspecified: Secondary | ICD-10-CM | POA: Diagnosis not present

## 2024-02-15 DIAGNOSIS — I10 Essential (primary) hypertension: Secondary | ICD-10-CM | POA: Diagnosis not present

## 2024-02-15 DIAGNOSIS — G8929 Other chronic pain: Secondary | ICD-10-CM | POA: Diagnosis not present

## 2024-02-15 MED ORDER — KETOROLAC TROMETHAMINE 60 MG/2ML IM SOLN
60.0000 mg | Freq: Once | INTRAMUSCULAR | Status: AC
  Administered 2024-02-15: 60 mg via INTRAMUSCULAR

## 2024-02-15 MED ORDER — TRAMADOL HCL 50 MG PO TABS: 50.0000 mg | ORAL_TABLET | Freq: Three times a day (TID) | ORAL | 2 refills | Status: DC | PRN

## 2024-02-15 MED ORDER — METHYLPREDNISOLONE ACETATE 80 MG/ML IJ SUSP
80.0000 mg | Freq: Once | INTRAMUSCULAR | Status: AC
  Administered 2024-02-15: 80 mg via INTRAMUSCULAR

## 2024-02-15 MED ORDER — PREDNISONE 20 MG PO TABS: ORAL_TABLET | ORAL | 0 refills | Status: DC

## 2024-02-15 MED ORDER — CLONIDINE HCL 0.2 MG PO TABS
0.2000 mg | ORAL_TABLET | Freq: Two times a day (BID) | ORAL | 1 refills | Status: DC
Start: 2024-02-15 — End: 2024-05-16

## 2024-02-15 NOTE — Progress Notes (Signed)
 Subjective:    Patient ID: Katie [name], female    DOB: March 01, 1932, 89 y.o.   MRN: [id number]    HPI  Patient Active Problem List   Diagnosis Date Noted   Spinal stenosis of lumbar region 04/22/2023   Breakage of internal fixation device in bone (HCC) 11/22/2019   Displaced intertrochanteric fracture of left femur, initial encounter for closed fracture (HCC) 09/22/2019   Hypomagnesemia 08/12/2019   Regurgitation of food 07/20/2019   Chronic midline low back pain without sciatica 10/22/2018   Primary insomnia 10/22/2018   Persistent atrial fibrillation (HCC) 04/27/2017   Chronic diastolic CHF (congestive heart failure) (HCC) 04/27/2017   CKD (chronic kidney disease) stage 3, GFR 30-59 ml/min (HCC) 05/14/2015   BMI 26.0-26.9,adult 05/11/2015   Peripheral edema 07/06/2014   Hypokalemia 07/22/2013   Hypothyroidism 07/22/2013   Osteopenia, senile 03/09/2013   Constipation 01/04/2013   Hypertension 05/07/2012   Hyperlipidemia associated with type 2 diabetes mellitus (HCC) 05/07/2012   Diabetes mellitus type 2, diet-controlled (HCC) 05/07/2012   GERD (gastroesophageal reflux disease) 05/07/2012     Chief Complaint: chronic back pain  States that her blood pressure has been running high at home. Last night it was 190 systolic.   Pain assessment: Cause of pain- DDD- they want her to have surgery but she refuses.  Pain location- lower back Pain on scale of 1-10- 10/10 currently Frequency- daily What increases pain-to much activity What makes pain Better-rest helps Effects on ADL - none Any change in general medical condition-none  Current opioids rx- ultram  50 # meds rx- 60 Effectiveness of current meds-helps but has been havingto take 3x a day Adverse reactions from pain meds-none Morphine  equivalent-  Pill count performed-Yes Last drug screen - 6/1/423 ( high risk q65m, moderate risk q59m, low risk yearly ) Urine drug screen today- No Was the NCCSR reviewed-  yes  If yes were their any concerning findings? - no   Overdose risk: 1    Pain contract signed on: 01/31/22      Review of Systems  Constitutional:  Negative for diaphoresis.  Eyes:  Negative for pain.  Respiratory:  Negative for shortness of breath.   Cardiovascular:  Negative for chest pain, palpitations and leg swelling.  Gastrointestinal:  Negative for abdominal pain.  Endocrine: Negative for polydipsia.  Skin:  Negative for rash.  Neurological:  Negative for dizziness, weakness and headaches.  Hematological:  Does not bruise/bleed easily.  All other systems reviewed and are negative.      Objective:   Physical Exam Vitals and nursing note reviewed.  Constitutional:      General: She is not in acute distress.    Appearance: Normal appearance. She is well-developed.  HENT:     Head: Normocephalic.     Right Ear: Tympanic membrane normal.     Left Ear: Tympanic membrane normal.     Nose: Nose normal.     Mouth/Throat:     Mouth: Mucous membranes are moist.   Eyes:     Pupils: Pupils are equal, round, and reactive to light.   Neck:     Vascular: No carotid bruit or JVD.   Cardiovascular:     Rate and Rhythm: Normal rate and regular rhythm.     Heart sounds: Normal heart sounds.  Pulmonary:     Effort: Pulmonary effort is normal. No respiratory distress.     Breath sounds: Normal breath sounds. No wheezing or rales.  Chest:     Chest  wall: No tenderness.  Abdominal:     General: Bowel sounds are normal. There is no distension or abdominal bruit.     Palpations: Abdomen is soft. There is no hepatomegaly, splenomegaly, mass or pulsatile mass.     Tenderness: There is no abdominal tenderness.   Musculoskeletal:        General: Normal range of motion.     Cervical back: Normal range of motion and neck supple.     Comments: Walking with cane   Lymphadenopathy:     Cervical: No cervical adenopathy.   Skin:    General: Skin is warm and dry.    Neurological:     Mental Status: She is alert and oriented to person, place, and time.     Deep Tendon Reflexes: Reflexes are normal and symmetric.   Psychiatric:        Behavior: Behavior normal.        Thought Content: Thought content normal.        Judgment: Judgment normal.     BP (!) 194/80   Pulse 67   Temp 98.1 F (36.7 C) (Temporal)   Ht 5' 6 (1.676 m)   Wt 150 lb (68 kg)   SpO2 93%   BMI 24.21 kg/m        Assessment & Plan:  Levorn VEAR Collum in today with chief complaint of pain meds  1. Chronic midline low back pain without sciatica Moist heat Rest  RTO prn - traMADol  (ULTRAM ) 50 MG tablet; Take 1 tablet (50 mg total) by mouth 3 (three) times daily as needed.  Dispense: 90 tablet; Refill: 2 - methylPREDNISolone  acetate (DEPO-MEDROL ) injection 80 mg - predniSONE  (DELTASONE ) 20 MG tablet; 2 po at sametime daily for 5 days-  Dispense: 10 tablet; Refill: 0  2. Hypertension Increase clonidine  to 0.2mg  BID Dash diet Keep diary of blood pressure at home.  Meds ordered this encounter  Medications   cloNIDine  (CATAPRES ) 0.2 MG tablet    Sig: Take 1 tablet (0.2 mg total) by mouth 2 (two) times daily.    Dispense:  180 tablet    Refill:  1    Supervising Provider:   DETTINGER, JOSHUA A [[id number]]   traMADol  (ULTRAM ) 50 MG tablet    Sig: Take 1 tablet (50 mg total) by mouth 3 (three) times daily as needed.    Dispense:  90 tablet    Refill:  2    Supervising Provider:   DETTINGER, JOSHUA A [[id number]]   predniSONE  (DELTASONE ) 20 MG tablet    Sig: 2 po at sametime daily for 5 days-    Dispense:  10 tablet    Refill:  0    Supervising Provider:   MARYANNE CHEW A [[id number]]   methylPREDNISolone  acetate (DEPO-MEDROL ) injection 80 mg   ketorolac  (TORADOL ) injection 60 mg     The above assessment and management plan was discussed with the patient. The patient verbalized understanding of and has agreed to the management plan. Patient is aware to call the clinic  if symptoms persist or worsen. Patient is aware when to return to the clinic for a follow-up visit. Patient educated on when it is appropriate to go to the emergency department.   Mary-Margaret Gladis, FNP

## 2024-02-25 ENCOUNTER — Encounter: Payer: Self-pay | Admitting: Nurse Practitioner

## 2024-02-25 ENCOUNTER — Ambulatory Visit: Admitting: Nurse Practitioner

## 2024-02-25 VITALS — BP 188/78 | HR 88 | Temp 97.6°F | Ht 66.0 in | Wt 150.0 lb

## 2024-02-25 DIAGNOSIS — I10 Essential (primary) hypertension: Secondary | ICD-10-CM

## 2024-02-25 DIAGNOSIS — H6123 Impacted cerumen, bilateral: Secondary | ICD-10-CM

## 2024-02-25 NOTE — Progress Notes (Signed)
 Subjective:    Patient ID: Katie [name], female    DOB: 08-Mar-1932, 89 y.o.   MRN: [id number]   Chief Complaint: blood pressure issues  HPI  Patient was seen on 02/15/24 for chronic follow up. We increased her clonidine  to 0.2mg  daily. Blood pressure came down to 117 systolic on meds during day but she said she had a dizzy spell so she did not take anymore. She started back on 0.1mg  bid and blood pressure id back u to 180 systolic.   Bilateral cerumen Impaction Patient Active Problem List   Diagnosis Date Noted   Spinal stenosis of lumbar region 04/22/2023   Breakage of internal fixation device in bone (HCC) 11/22/2019   Displaced intertrochanteric fracture of left femur, initial encounter for closed fracture (HCC) 09/22/2019   Hypomagnesemia 08/12/2019   Regurgitation of food 07/20/2019   Chronic midline low back pain without sciatica 10/22/2018   Primary insomnia 10/22/2018   Persistent atrial fibrillation (HCC) 04/27/2017   Chronic diastolic CHF (congestive heart failure) (HCC) 04/27/2017   CKD (chronic kidney disease) stage 3, GFR 30-59 ml/min (HCC) 05/14/2015   BMI 26.0-26.9,adult 05/11/2015   Peripheral edema 07/06/2014   Hypokalemia 07/22/2013   Hypothyroidism 07/22/2013   Osteopenia, senile 03/09/2013   Constipation 01/04/2013   Hypertension 05/07/2012   Hyperlipidemia associated with type 2 diabetes mellitus (HCC) 05/07/2012   Diabetes mellitus type 2, diet-controlled (HCC) 05/07/2012   GERD (gastroesophageal reflux disease) 05/07/2012       Review of Systems  Constitutional:  Negative for diaphoresis.  Eyes:  Negative for pain.  Respiratory:  Negative for shortness of breath.   Cardiovascular:  Negative for chest pain, palpitations and leg swelling.  Gastrointestinal:  Negative for abdominal pain.  Endocrine: Negative for polydipsia.  Skin:  Negative for rash.  Neurological:  Negative for dizziness, weakness and headaches.  Hematological:  Does not  bruise/bleed easily.  All other systems reviewed and are negative.      Objective:   Physical Exam Constitutional:      Appearance: Normal appearance.  HENT:     Right Ear: There is impacted cerumen.     Left Ear: There is impacted cerumen.  Cardiovascular:     Rate and Rhythm: Normal rate and regular rhythm.     Heart sounds: Normal heart sounds.  Pulmonary:     Breath sounds: Normal breath sounds.  Skin:    General: Skin is warm.  Neurological:     General: No focal deficit present.     Mental Status: She is alert and oriented to person, place, and time.  Psychiatric:        Mood and Affect: Mood normal.        Behavior: Behavior normal.    BP (!) 188/78 (Cuff Size: Normal)   Pulse 88   Temp 97.6 F (36.4 C) (Skin)   Ht 5' 6 (1.676 m)   Wt 150 lb (68 kg)   BMI 24.21 kg/m   Bil ear canal lavage       Assessment & Plan:  Katie [name] in today with chief complaint of hypertension   1. Primary hypertension (Primary) Will try clonidine  0.2mg  at night and 0.1mg  in mornings Continue to keep a check of blood pressure at home Force fluids Rise slowly from sitting to standing. Keep diary of blood pressure at home.  2. Bil cerumen impaction Debrox 2-3 x a week.  The above assessment and management plan was discussed with the patient. The patient verbalized  understanding of and has agreed to the management plan. Patient is aware to call the clinic if symptoms persist or worsen. Patient is aware when to return to the clinic for a follow-up visit. Patient educated on when it is appropriate to go to the emergency department.   Mary-Margaret Gladis, FNP

## 2024-02-25 NOTE — Patient Instructions (Signed)
 Earwax Buildup, Adult Your ears make something called earwax. It helps keep germs called bacteria away and protects the skin in your ears. Sometimes, too much earwax can build up. This can cause discomfort or make it harder to hear. What are the causes? Earwax buildup can happen when you have too much earwax in your ears. Earwax is made in the outer part of your ear canal. It's supposed to fall out in small amounts over time. But if your ears aren't able to clean themselves like they should, earwax can build up. What increases the risk? You're more likely to get earwax buildup if: You clean your ears with cotton swabs. You pick at your ears. You use earplugs or in-ear headphones a lot. You wear hearing aids. You may also be more likely to get it if: You're female. You're older. Your ears naturally make more earwax. You have narrow ear canals or extra hair in your ears. Your earwax is too thick or sticky. You have eczema. You're dehydrated. This means there's not enough fluid in your body. What are the signs or symptoms? Symptoms of earwax buildup include: Not being able to hear as well. A feeling of fullness in your ear. Feeling like your ear is plugged. Fluid coming from your ear. Ear pain or an itchy ear. Ringing in your ear. Coughing or problems with balance. How is this diagnosed? Earwax buildup may be diagnosed based on your symptoms, medical history, and an ear exam. During the exam, your health care provider will look into your ear with a tool called an otoscope. You may also have tests, such as a hearing test. How is this treated? Earwax buildup may be treated by: Using ear drops. Having the earwax removed by a provider. The provider may: Flush the ear with water. Use a tool called a curette that has a loop on the end. Use a suction device. Having surgery. This may be done in severe cases. Follow these instructions at home:  Cleaning your ears Clean your ears as told  by your provider. You can clean the outside of your ears with a washcloth or tissue. Do not overclean your ears. Do not put anything into your ear unless told. This includes cotton swabs. General instructions Take over-the-counter and prescription medicines only as told by your provider. Drink enough fluid to keep your pee (urine) pale yellow. This helps thin the earwax. If you have hearing aids, clean them as told. Keep all follow-up visits. If earwax builds up in your ears often or if you use hearing aids, ask your provider how often you should have your ears cleaned. Contact a health care provider if: Your ear pain gets worse. You have a fever. You have pus, blood, or other fluid coming from your ear. You have hearing loss. You have ringing in your ears that won't go away. You feel like the room is spinning. This is called vertigo. Your symptoms don't get better with treatment. This information is not intended to replace advice given to you by your health care provider. Make sure you discuss any questions you have with your health care provider. Document Revised: 10/16/2022 Document Reviewed: 10/16/2022 Elsevier Patient Education  2024 [employer].

## 2024-03-08 ENCOUNTER — Other Ambulatory Visit: Payer: Self-pay | Admitting: Cardiology

## 2024-04-20 ENCOUNTER — Other Ambulatory Visit: Payer: Self-pay | Admitting: Nurse Practitioner

## 2024-04-20 DIAGNOSIS — K219 Gastro-esophageal reflux disease without esophagitis: Secondary | ICD-10-CM

## 2024-05-16 ENCOUNTER — Encounter: Payer: Self-pay | Admitting: Nurse Practitioner

## 2024-05-16 ENCOUNTER — Ambulatory Visit: Admitting: Nurse Practitioner

## 2024-05-16 VITALS — BP 160/88 | HR 68 | Temp 98.1°F | Ht 66.0 in | Wt 149.0 lb

## 2024-05-16 DIAGNOSIS — I5032 Chronic diastolic (congestive) heart failure: Secondary | ICD-10-CM

## 2024-05-16 DIAGNOSIS — I10 Essential (primary) hypertension: Secondary | ICD-10-CM

## 2024-05-16 DIAGNOSIS — F5101 Primary insomnia: Secondary | ICD-10-CM | POA: Diagnosis not present

## 2024-05-16 DIAGNOSIS — M545 Low back pain, unspecified: Secondary | ICD-10-CM

## 2024-05-16 DIAGNOSIS — R600 Localized edema: Secondary | ICD-10-CM

## 2024-05-16 DIAGNOSIS — E1169 Type 2 diabetes mellitus with other specified complication: Secondary | ICD-10-CM

## 2024-05-16 DIAGNOSIS — M8580 Other specified disorders of bone density and structure, unspecified site: Secondary | ICD-10-CM

## 2024-05-16 DIAGNOSIS — N1832 Chronic kidney disease, stage 3b: Secondary | ICD-10-CM

## 2024-05-16 DIAGNOSIS — E876 Hypokalemia: Secondary | ICD-10-CM

## 2024-05-16 DIAGNOSIS — I4819 Other persistent atrial fibrillation: Secondary | ICD-10-CM | POA: Diagnosis not present

## 2024-05-16 DIAGNOSIS — K219 Gastro-esophageal reflux disease without esophagitis: Secondary | ICD-10-CM

## 2024-05-16 DIAGNOSIS — E039 Hypothyroidism, unspecified: Secondary | ICD-10-CM

## 2024-05-16 DIAGNOSIS — G8929 Other chronic pain: Secondary | ICD-10-CM

## 2024-05-16 DIAGNOSIS — E785 Hyperlipidemia, unspecified: Secondary | ICD-10-CM | POA: Diagnosis not present

## 2024-05-16 DIAGNOSIS — E119 Type 2 diabetes mellitus without complications: Secondary | ICD-10-CM

## 2024-05-16 DIAGNOSIS — Z23 Encounter for immunization: Secondary | ICD-10-CM | POA: Diagnosis not present

## 2024-05-16 LAB — LIPID PANEL

## 2024-05-16 MED ORDER — CLONIDINE HCL 0.2 MG PO TABS
0.2000 mg | ORAL_TABLET | Freq: Two times a day (BID) | ORAL | 1 refills | Status: DC
Start: 2024-05-16 — End: 2024-07-05

## 2024-05-16 MED ORDER — FUROSEMIDE 40 MG PO TABS: 40.0000 mg | ORAL_TABLET | Freq: Every day | ORAL | 1 refills | Status: AC

## 2024-05-16 MED ORDER — GI COCKTAIL ~~LOC~~: 30.0000 mL | Freq: Every day | ORAL | 2 refills | Status: DC

## 2024-05-16 MED ORDER — POTASSIUM CHLORIDE CRYS ER 20 MEQ PO TBCR: 20.0000 meq | EXTENDED_RELEASE_TABLET | Freq: Every day | ORAL | 1 refills | Status: AC

## 2024-05-16 MED ORDER — RIVAROXABAN 15 MG PO TABS: 15.0000 mg | ORAL_TABLET | Freq: Every day | ORAL | 1 refills | Status: AC

## 2024-05-16 MED ORDER — RALOXIFENE HCL 60 MG PO TABS: 60.0000 mg | ORAL_TABLET | Freq: Every day | ORAL | 1 refills | Status: AC

## 2024-05-16 MED ORDER — BENAZEPRIL HCL 40 MG PO TABS: 40.0000 mg | ORAL_TABLET | Freq: Every day | ORAL | 1 refills | Status: AC

## 2024-05-16 MED ORDER — PANTOPRAZOLE SODIUM 40 MG PO TBEC
40.0000 mg | DELAYED_RELEASE_TABLET | Freq: Every day | ORAL | 1 refills | Status: AC
Start: 2024-05-16 — End: ?

## 2024-05-16 MED ORDER — TRAMADOL HCL 50 MG PO TABS: 50.0000 mg | ORAL_TABLET | Freq: Three times a day (TID) | ORAL | 2 refills | Status: DC | PRN

## 2024-05-16 MED ORDER — LOVASTATIN 40 MG PO TABS: 40.0000 mg | ORAL_TABLET | Freq: Every day | ORAL | 1 refills | Status: AC

## 2024-05-16 MED ORDER — GI COCKTAIL ~~LOC~~: 30.0000 mL | Freq: Every day | ORAL | 2 refills | Status: AC

## 2024-05-16 MED ORDER — FAMOTIDINE 20 MG PO TABS: 20.0000 mg | ORAL_TABLET | Freq: Every day | ORAL | 0 refills | Status: AC

## 2024-05-16 MED ORDER — LEVOTHYROXINE SODIUM 75 MCG PO TABS: 75.0000 ug | ORAL_TABLET | Freq: Every day | ORAL | 1 refills | Status: AC

## 2024-05-16 NOTE — Patient Instructions (Signed)

## 2024-05-16 NOTE — Progress Notes (Signed)
 Subjective:    Patient ID: Katie [name], female    DOB: 1932/04/08, 89 y.o.   MRN: [id number]   Chief Complaint: medical management of chronic issues     HPI:  Katie [name] is a 89 y.o. who identifies as a female who was assigned female at birth.   Social history: Lives with: by herself- family checks on her daily Work history: retired   Water engineer in today for follow up of the following chronic medical issues:  1. Primary hypertension No c/o chest pain, sob or headache. Does  check blood pressure at home. Has been running in 150-160 most of the time. She has been taking clonidine  0.1mg  in mornings and 0.2mg  at night. She has also been on benazepril  40mg  daily. BP Readings from Last 3 Encounters:  02/25/24 (!) 188/78  02/15/24 (!) 194/80  11/17/23 (!) 140/59     2. Chronic diastolic CHF (congestive heart failure) (HCC) 3. Persistent atrial fibrillation (HCC) Is on xeralto with no bleeding issues. Denies palpitations or feeling that heart racing. Last saw cardiology on 09/23/23. No changes made to plan of care.  4. Gastroesophageal reflux disease without esophagitis Is on combination of pepcid  and protonix - works well for her.  5. Diabetes mellitus type 2, diet-controlled (HCC) She does not check her blood sugars at home. Lab Results  Component Value Date   HGBA1C 7.2 (H) 11/17/2023     6. Acquired hypothyroidism No issues that she is aware of. Lab Results  Component Value Date   TSH 4.780 (H) 11/17/2023     7. Stage 3b chronic kidney disease (HCC) No voiding issues Lab Results  Component Value Date   CREATININE 1.02 (H) 11/17/2023     8. Hyperlipidemia associated with type 2 diabetes mellitus (HCC) Watches diet but does no exercise. Lab Results  Component Value Date   CHOL 165 11/17/2023   HDL 62 11/17/2023   LDLCALC 78 11/17/2023   TRIG 144 11/17/2023   CHOLHDL 2.7 11/17/2023     9. Hypokalemia No muscle cramping Lab Results  Component Value  Date   K 4.2 11/17/2023     10. Hypomagnesemia Last magnesium level was 2.1   11. Peripheral edema Has some daily by the end of the day  12. Primary insomnia Is currently on no sleep aids  13. Chronic back pain Pain assessment: She has been out of pain meds and s really hurting right ow. Would like steroids. Cause of pain- radiculopathy Pain location- lower back --Pain on scale of 1-10- 7-8/10 Frequency- daily What increases pain-nothing really What makes pain Better-is currently getting physical therapy Effects on ADL - none Any change in general medical condition-no  Current opioids rx- ultramTID # meds rx- 90 Effectiveness of current meds-helps Adverse reactions from pain meds-none Morphine  equivalent- 15 MME  Pill count performed-No Last drug screen - 01/29/22 ( high risk q54m, moderate risk q84m, low risk yearly ) Urine drug screen today- Yes Was the [facility] reviewed- yes  If yes were their any concerning findings? - no   Overdose risk: 1   Pain contract signed on: 01/31/22  New complaints: Hx of hypomagnesium- will recheck today  Allergies  Allergen Reactions   Shellfish Allergy Other (See Comments)   Amlodipine  Swelling   Macrodantin Nausea And Vomiting   Metformin And Related Nausea And Vomiting and Other (See Comments)    Bloating   Penicillins Rash    Did it involve swelling of the face/tongue/throat, SOB, or low BP? Unknown  Did it involve sudden or severe rash/hives, skin peeling, or any reaction on the inside of your mouth or nose? Yes Did you need to seek medical attention at a hospital or doctor's office? Yes When did it last happen? 2005  If all above answers are "NO", may proceed with cephalosporin use.    Outpatient Encounter Medications as of 05/16/2024  Medication Sig   Alum & Mag Hydroxide-Simeth (GI COCKTAIL) SUSP suspension Take 30 mLs by mouth at bedtime. Shake well.   benazepril  (LOTENSIN ) 40 MG tablet Take 1 tablet (40 mg total) by  mouth at bedtime.   Cholecalciferol  (VITAMIN D -3) 125 MCG (5000 UT) TABS Take 5,000 Units by mouth daily.   cloNIDine  (CATAPRES ) 0.2 MG tablet Take 1 tablet (0.2 mg total) by mouth 2 (two) times daily.   famotidine  (PEPCID ) 20 MG tablet TAKE ONE TABLET AT BEDTIME   feeding supplement, ENSURE ENLIVE, (ENSURE ENLIVE) LIQD Take 237 mLs by mouth 2 (two) times daily between meals.   fluticasone  (FLONASE ) 50 MCG/ACT nasal spray Place 2 sprays into both nostrils daily.   furosemide  (LASIX ) 40 MG tablet Take 1 tablet (40 mg total) by mouth daily.   hydrALAZINE  (APRESOLINE ) 10 MG tablet TAKE ONE TABLET EVERY MORNING AND AT BEDTIME   levothyroxine  (SYNTHROID ) 75 MCG tablet Take 1 tablet (75 mcg total) by mouth daily.   lovastatin  (MEVACOR ) 40 MG tablet Take 1 tablet (40 mg total) by mouth at bedtime.   Multiple Vitamin (MULTIVITAMIN WITH MINERALS) TABS tablet Take 1 tablet by mouth daily.   pantoprazole  (PROTONIX ) 40 MG tablet Take 1 tablet (40 mg total) by mouth daily.   potassium chloride  SA (KLOR-CON  M) 20 MEQ tablet Take 1 tablet (20 mEq total) by mouth daily.   predniSONE  (DELTASONE ) 20 MG tablet 2 po at sametime daily for 5 days-   raloxifene  (EVISTA ) 60 MG tablet Take 1 tablet (60 mg total) by mouth daily.   Rivaroxaban  (XARELTO ) 15 MG TABS tablet Take 1 tablet (15 mg total) by mouth daily with supper.   traMADol  (ULTRAM ) 50 MG tablet Take 1 tablet (50 mg total) by mouth 3 (three) times daily as needed.   No facility-administered encounter medications on file as of 05/16/2024.    Past Surgical History:  Procedure Laterality Date   A-FLUTTER ABLATION N/A 02/08/2019   Procedure: A-FLUTTER ABLATION;  Surgeon: Kelsie Agent, MD;  Location: [facility];  Service: Cardiovascular;  Laterality: N/A;   ABDOMINAL HYSTERECTOMY     APPENDECTOMY  1980   BACK SURGERY     CATARACT EXTRACTION, BILATERAL     CHOLECYSTECTOMY  5/00   COLONOSCOPY     implantable loop recorder placement  04/06/2019    MDT Reveal LINQ [id number] ([id number]) implanted for evaluation of palpitations and afib post atrial flutter ablation by Dr Kelsie in office   INTRAMEDULLARY (IM) NAIL INTERTROCHANTERIC Left 09/23/2019   Procedure: INTRAMEDULLARY (IM) NAIL INTERTROCHANTRIC;  Surgeon: Kendal Franky SQUIBB, MD;  Location: [facility] OR;  Service: Orthopedics;  Laterality: Left;   TONSILLECTOMY     TOTAL ABDOMINAL HYSTERECTOMY W/ BILATERAL SALPINGOOPHORECTOMY  1980   UPPER GASTROINTESTINAL ENDOSCOPY      Family History  Problem Relation Age of Onset   Diabetes Mother    Stroke Mother    Heart disease Father    Stroke Father    Uterine cancer Sister    Diabetes Sister    Ovarian cancer Sister    Colon cancer Sister    Diabetes Sister    Liver  cancer Sister        \   Atrial fibrillation Sister    Diabetes Sister    Diabetes Brother    Dementia Brother    Diabetes Son    Healthy Son    Heart attack Neg Hx    Hypertension Neg Hx    Esophageal cancer Neg Hx    Rectal cancer Neg Hx    Stomach cancer Neg Hx    Breast cancer Neg Hx       Controlled substance contract: n/a     Review of Systems  Constitutional:  Negative for diaphoresis.  Eyes:  Negative for pain.  Respiratory:  Negative for shortness of breath.   Cardiovascular:  Negative for chest pain, palpitations and leg swelling.  Gastrointestinal:  Negative for abdominal pain.  Endocrine: Negative for polydipsia.  Skin:  Negative for rash.  Neurological:  Negative for dizziness, weakness and headaches.  Hematological:  Does not bruise/bleed easily.  All other systems reviewed and are negative.      Objective:   Physical Exam Vitals and nursing note reviewed.  Constitutional:      General: She is not in acute distress.    Appearance: Normal appearance. She is well-developed.  HENT:     Head: Normocephalic.     Right Ear: Tympanic membrane normal.     Left Ear: Tympanic membrane normal.     Nose: Nose normal.     Mouth/Throat:     Mouth:  Mucous membranes are moist.  Eyes:     Pupils: Pupils are equal, round, and reactive to light.  Neck:     Vascular: No carotid bruit or JVD.  Cardiovascular:     Rate and Rhythm: Normal rate and regular rhythm.     Heart sounds: Normal heart sounds.  Pulmonary:     Effort: Pulmonary effort is normal. No respiratory distress.     Breath sounds: Normal breath sounds. No wheezing or rales.  Chest:     Chest wall: No tenderness.  Abdominal:     General: Bowel sounds are normal. There is no distension or abdominal bruit.     Palpations: Abdomen is soft. There is no hepatomegaly, splenomegaly, mass or pulsatile mass.     Tenderness: There is no abdominal tenderness.  Musculoskeletal:        General: Normal range of motion.     Cervical back: Normal range of motion and neck supple.     Comments: Rises slowly from sitting to standing Pain mainly when walking (-) SLR bil  Lymphadenopathy:     Cervical: No cervical adenopathy.  Skin:    General: Skin is warm and dry.  Neurological:     Mental Status: She is alert and oriented to person, place, and time.     Deep Tendon Reflexes: Reflexes are normal and symmetric.  Psychiatric:        Behavior: Behavior normal.        Thought Content: Thought content normal.        Judgment: Judgment normal.    BP (!) 160/88   Pulse 68   Temp 98.1 F (36.7 C) (Temporal)   Ht 5' 6 (1.676 m)   Wt 149 lb (67.6 kg)   SpO2 95%   BMI 24.05 kg/m   HGBA1c 7.3%    Assessment & Plan:   Katie [name] comes in today with chief complaint of medical management of chronic issues    Diagnosis and orders addressed:  1. Primary hypertension Low  sodium diet - CBC with Differential/Platelet - CMP14+EGFR - benazepril  (LOTENSIN ) 40 MG tablet; Take 1 tablet (40 mg total) by mouth at bedtime.  Dispense: 90 tablet; Refill: 1  2. Chronic diastolic CHF (congestive heart failure) (HCC) 3. Persistent atrial fibrillation (HCC) Keep follow up with cardiology -  Rivaroxaban  (XARELTO ) 15 MG TABS tablet; Take 1 tablet (15 mg total) by mouth daily with supper.  Dispense: 90 tablet; Refill: 1  4. Gastroesophageal reflux disease without esophagitis Avoid spicy foods Do not eat 2 hours prior to bedtime - famotidine  (PEPCID ) 20 MG tablet; Take 1 tablet (20 mg total) by mouth at bedtime.  Dispense: 90 tablet; Refill: 1 - pantoprazole  (PROTONIX ) 40 MG tablet; Take 1 tablet (40 mg total) by mouth daily.  Dispense: 90 tablet; Refill: 1  5. Diabetes mellitus type 2, diet-controlled (HCC) Continue to watch carbs  6. Acquired hypothyroidism Labs pending - Thyroid  Panel With TSH - levothyroxine  (SYNTHROID ) 75 MCG tablet; Take 1 tablet (75 mcg total) by mouth daily.  Dispense: 90 tablet; Refill: 1  7. Stage 3b chronic kidney disease (HCC) Labs pending  8. Hyperlipidemia associated with type 2 diabetes mellitus (HCC) Low fat diet - Lipid panel - lovastatin  (MEVACOR ) 40 MG tablet; Take 1 tablet (40 mg total) by mouth at bedtime.  Dispense: 90 tablet; Refill: 1  9. Hypokalemia Labs pending - potassium chloride  SA (KLOR-CON  M) 20 MEQ tablet; Take 1 tablet (20 mEq total) by mouth daily.  Dispense: 90 tablet; Refill: 1  10. Hypomagnesemia Labs pending  11. Peripheral edema Elevate legs when siting - furosemide  (LASIX ) 40 MG tablet; Take 1 tablet (40 mg total) by mouth daily.  Dispense: 90 tablet; Refill: 1  12. Primary insomnia Bedtime routine  13. Chronic midline low back pain without sciatica Moist heat Continue physical therapy - traMADol  (ULTRAM ) 50 MG tablet; Take 1 tablet (50 mg total) by mouth 3 (three) times daily as needed.  Dispense: 90 tablet; Refill: 2 - ToxASSURE Select 13 (MW), Urine - ToxASSURE Select 13 (MW), Urine - methylPREDNISolone  acetate (DEPO-MEDROL ) injection 80 mg - predniSONE  (DELTASONE ) 20 MG tablet; 2 po at sametime daily for 5 days-  Dispense: 10 tablet; Refill: 0  14. Constipation Miralax  in apple juice daily Increase  fiber in diet  Labs pending Health Maintenance reviewed Diet and exercise encouraged  Follow up plan: 6 months   Mary-Margaret Gladis, FNP

## 2024-05-17 ENCOUNTER — Ambulatory Visit: Payer: Self-pay | Admitting: Nurse Practitioner

## 2024-05-17 LAB — CMP14+EGFR
ALT: 12 IU/L (ref 0–32)
AST: 20 IU/L (ref 0–40)
Albumin: 4.3 g/dL (ref 3.6–4.6)
Alkaline Phosphatase: 69 IU/L (ref 48–129)
BUN/Creatinine Ratio: 15 (ref 12–28)
BUN: 18 mg/dL (ref 10–36)
Bilirubin Total: 0.3 mg/dL (ref 0.0–1.2)
CO2: 25 mmol/L (ref 20–29)
Calcium: 9.6 mg/dL (ref 8.7–10.3)
Chloride: 97 mmol/L (ref 96–106)
Creatinine, Ser: 1.22 mg/dL — ABNORMAL HIGH (ref 0.57–1.00)
Globulin, Total: 2.3 g/dL (ref 1.5–4.5)
Glucose: 130 mg/dL — ABNORMAL HIGH (ref 70–99)
Potassium: 4.3 mmol/L (ref 3.5–5.2)
Sodium: 135 mmol/L (ref 134–144)
Total Protein: 6.6 g/dL (ref 6.0–8.5)
eGFR: 42 mL/min/1.73 — ABNORMAL LOW (ref >59)

## 2024-05-17 LAB — CBC WITH DIFFERENTIAL/PLATELET
Basophils Absolute: 0 x10E3/uL (ref 0.0–0.2)
Basos: 0 %
EOS (ABSOLUTE): 0.1 x10E3/uL (ref 0.0–0.4)
Eos: 2 %
Hematocrit: 37.2 % (ref 34.0–46.6)
Hemoglobin: 11.9 g/dL (ref 11.1–15.9)
Immature Grans (Abs): 0 x10E3/uL (ref 0.0–0.1)
Immature Granulocytes: 0 %
Lymphocytes Absolute: 3 x10E3/uL (ref 0.7–3.1)
Lymphs: 39 %
MCH: 32.8 pg (ref 26.6–33.0)
MCHC: 32 g/dL (ref 31.5–35.7)
MCV: 103 fL — ABNORMAL HIGH (ref 79–97)
Monocytes Absolute: 0.6 x10E3/uL (ref 0.1–0.9)
Monocytes: 8 %
Neutrophils Absolute: 4 x10E3/uL (ref 1.4–7.0)
Neutrophils: 51 %
Platelets: 257 x10E3/uL (ref 150–450)
RBC: 3.63 x10E6/uL — ABNORMAL LOW (ref 3.77–5.28)
RDW: 12.3 % (ref 11.7–15.4)
WBC: 7.7 x10E3/uL (ref 3.4–10.8)

## 2024-05-17 LAB — LIPID PANEL
Cholesterol, Total: 142 mg/dL (ref 100–199)
HDL: 57 mg/dL (ref >39)
LDL CALC COMMENT:: 2.5 ratio (ref 0.0–4.4)
LDL Chol Calc (NIH): 63 mg/dL (ref 0–99)
Triglycerides: 127 mg/dL (ref 0–149)
VLDL Cholesterol Cal: 22 mg/dL (ref 5–40)

## 2024-05-17 LAB — MAGNESIUM: Magnesium: 2.1 mg/dL (ref 1.6–2.3)

## 2024-05-17 LAB — BAYER DCA HB A1C WAIVED: HB A1C (BAYER DCA - WAIVED): 7.3 % — ABNORMAL HIGH (ref 4.8–5.6)

## 2024-05-24 DIAGNOSIS — L820 Inflamed seborrheic keratosis: Secondary | ICD-10-CM | POA: Diagnosis not present

## 2024-05-24 DIAGNOSIS — C4442 Squamous cell carcinoma of skin of scalp and neck: Secondary | ICD-10-CM | POA: Diagnosis not present

## 2024-05-24 DIAGNOSIS — D692 Other nonthrombocytopenic purpura: Secondary | ICD-10-CM | POA: Diagnosis not present

## 2024-05-24 DIAGNOSIS — D0472 Carcinoma in situ of skin of left lower limb, including hip: Secondary | ICD-10-CM | POA: Diagnosis not present

## 2024-05-24 DIAGNOSIS — D044 Carcinoma in situ of skin of scalp and neck: Secondary | ICD-10-CM | POA: Diagnosis not present

## 2024-05-24 DIAGNOSIS — R208 Other disturbances of skin sensation: Secondary | ICD-10-CM | POA: Diagnosis not present

## 2024-05-24 DIAGNOSIS — L538 Other specified erythematous conditions: Secondary | ICD-10-CM | POA: Diagnosis not present

## 2024-05-24 DIAGNOSIS — D485 Neoplasm of uncertain behavior of skin: Secondary | ICD-10-CM | POA: Diagnosis not present

## 2024-05-24 DIAGNOSIS — L568 Other specified acute skin changes due to ultraviolet radiation: Secondary | ICD-10-CM | POA: Diagnosis not present

## 2024-05-27 ENCOUNTER — Encounter (HOSPITAL_COMMUNITY): Payer: Self-pay

## 2024-05-27 ENCOUNTER — Emergency Department (HOSPITAL_COMMUNITY)

## 2024-05-27 ENCOUNTER — Emergency Department (HOSPITAL_COMMUNITY)
Admission: EM | Admit: 2024-05-27 | Discharge: 2024-05-28 | Disposition: A | Discharge status: Home / Self Care | Attending: Emergency Medicine | Admitting: Emergency Medicine

## 2024-05-27 DIAGNOSIS — M25569 Pain in unspecified knee: Secondary | ICD-10-CM | POA: Diagnosis not present

## 2024-05-27 DIAGNOSIS — S8001XA Contusion of right knee, initial encounter: Secondary | ICD-10-CM | POA: Diagnosis not present

## 2024-05-27 DIAGNOSIS — S1191XA Laceration without foreign body of unspecified part of neck, initial encounter: Secondary | ICD-10-CM | POA: Insufficient documentation

## 2024-05-27 DIAGNOSIS — Z743 Need for continuous supervision: Secondary | ICD-10-CM | POA: Diagnosis not present

## 2024-05-27 DIAGNOSIS — M25562 Pain in left knee: Secondary | ICD-10-CM | POA: Diagnosis not present

## 2024-05-27 DIAGNOSIS — K7689 Other specified diseases of liver: Secondary | ICD-10-CM | POA: Insufficient documentation

## 2024-05-27 DIAGNOSIS — S20212A Contusion of left front wall of thorax, initial encounter: Secondary | ICD-10-CM | POA: Diagnosis not present

## 2024-05-27 DIAGNOSIS — S299XXA Unspecified injury of thorax, initial encounter: Secondary | ICD-10-CM | POA: Diagnosis not present

## 2024-05-27 DIAGNOSIS — I6789 Other cerebrovascular disease: Secondary | ICD-10-CM | POA: Insufficient documentation

## 2024-05-27 DIAGNOSIS — S8002XA Contusion of left knee, initial encounter: Secondary | ICD-10-CM | POA: Diagnosis not present

## 2024-05-27 DIAGNOSIS — R6889 Other general symptoms and signs: Secondary | ICD-10-CM | POA: Diagnosis not present

## 2024-05-27 DIAGNOSIS — S0990XA Unspecified injury of head, initial encounter: Secondary | ICD-10-CM | POA: Diagnosis not present

## 2024-05-27 DIAGNOSIS — M25561 Pain in right knee: Secondary | ICD-10-CM | POA: Diagnosis not present

## 2024-05-27 DIAGNOSIS — Z041 Encounter for examination and observation following transport accident: Secondary | ICD-10-CM | POA: Diagnosis not present

## 2024-05-27 DIAGNOSIS — Y92410 Unspecified street and highway as the place of occurrence of the external cause: Secondary | ICD-10-CM | POA: Insufficient documentation

## 2024-05-27 DIAGNOSIS — R609 Edema, unspecified: Secondary | ICD-10-CM | POA: Diagnosis not present

## 2024-05-27 DIAGNOSIS — S199XXA Unspecified injury of neck, initial encounter: Secondary | ICD-10-CM | POA: Diagnosis not present

## 2024-05-27 DIAGNOSIS — I700 Atherosclerosis of aorta: Secondary | ICD-10-CM | POA: Insufficient documentation

## 2024-05-27 DIAGNOSIS — M19042 Primary osteoarthritis, left hand: Secondary | ICD-10-CM | POA: Diagnosis not present

## 2024-05-27 DIAGNOSIS — S72402A Unspecified fracture of lower end of left femur, initial encounter for closed fracture: Secondary | ICD-10-CM | POA: Diagnosis not present

## 2024-05-27 DIAGNOSIS — S3991XA Unspecified injury of abdomen, initial encounter: Secondary | ICD-10-CM | POA: Diagnosis not present

## 2024-05-27 DIAGNOSIS — S3993XA Unspecified injury of pelvis, initial encounter: Secondary | ICD-10-CM | POA: Diagnosis not present

## 2024-05-27 DIAGNOSIS — S61012A Laceration without foreign body of left thumb without damage to nail, initial encounter: Secondary | ICD-10-CM | POA: Diagnosis not present

## 2024-05-27 DIAGNOSIS — M5030 Other cervical disc degeneration, unspecified cervical region: Secondary | ICD-10-CM | POA: Diagnosis not present

## 2024-05-27 DIAGNOSIS — M1712 Unilateral primary osteoarthritis, left knee: Secondary | ICD-10-CM | POA: Diagnosis not present

## 2024-05-27 DIAGNOSIS — M542 Cervicalgia: Secondary | ICD-10-CM | POA: Diagnosis present

## 2024-05-27 LAB — BASIC METABOLIC PANEL WITH GFR
Anion gap: 15 (ref 5–15)
BUN: 15 mg/dL (ref 8–23)
CO2: 26 mmol/L (ref 22–32)
Calcium: 9.7 mg/dL (ref 8.9–10.3)
Chloride: 98 mmol/L (ref 98–111)
Creatinine, Ser: 1 mg/dL (ref 0.44–1.00)
GFR, Estimated: 53 mL/min — ABNORMAL LOW (ref >60)
Glucose, Bld: 151 mg/dL — ABNORMAL HIGH (ref 70–99)
Potassium: 3.6 mmol/L (ref 3.5–5.1)
Sodium: 139 mmol/L (ref 135–145)

## 2024-05-27 MED ORDER — IOHEXOL 300 MG/ML  SOLN
100.0000 mL | Freq: Once | INTRAMUSCULAR | Status: AC | PRN
  Administered 2024-05-27: 100 mL via INTRAVENOUS

## 2024-05-27 MED ORDER — ACETAMINOPHEN 325 MG PO TABS
650.0000 mg | ORAL_TABLET | Freq: Once | ORAL | Status: AC
  Administered 2024-05-27: 650 mg via ORAL
  Filled 2024-05-27: qty 2

## 2024-05-27 NOTE — Discharge Instructions (Addendum)
Tylenol for pain.  Return if any problems.
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
## 2024-05-28 NOTE — ED Notes (Signed)
 Gauze dressing applied to left lateral side of neck.

## 2024-06-20 ENCOUNTER — Encounter: Payer: Self-pay | Admitting: Radiology

## 2024-07-04 ENCOUNTER — Ambulatory Visit: Payer: Self-pay

## 2024-07-04 NOTE — Telephone Encounter (Signed)
 Appt made.

## 2024-07-04 NOTE — Telephone Encounter (Signed)
 FYI Only or Action Required?: FYI only for provider: appointment scheduled on 11/18.  Patient was last seen in primary care on 05/16/2024 by Gladis Mustard, FNP.  Called Nurse Triage reporting Foot Swelling.  Symptoms began a week ago.  Interventions attempted: Rest, hydration, or home remedies.  Symptoms are: gradually improving.  Triage Disposition: No disposition on file.  Patient/caregiver understands and will follow disposition?:   Copied from CRM #[id number]. Topic: Clinical - Medication Question >> Jul 04, 2024  9:01 AM [name] wrote: Reason for CRM: Patient is taking cloNIDine  (CATAPRES ) 0.2 MG tablet and every since the provider put her on 0.2 its causing her feet to swell and her ankles and she is feeling sick. She is wanting to speak to someone or see someone today. Callback number is [phone]. Reason for Disposition  [1] MILD swelling of both ankles (e.g., ankle joints look swollen; or bilateral mild pedal edema) AND [2] new-onset or getting worse  (Exceptions: Caused by hot weather, already seen by doctor or NP/PA for this.)  Answer Assessment - Initial Assessment Questions Increased dose of clonidine  at the end of Sept from 0.1 to 0.2mg . Was with her son all last week about 2 hours away. Ankle swelling Started slightly prior to trip- sitting for long periods may have made it worse. Denies pitting, might be tight but unsure. Denies color or temp changes, denies fever Hx of CHF- Weight around 150lbs- doesn't feel like she has gained any weight but hasn't checked. BP was 160-170s when visiting her son, At home her BP machine isnt working. Denies CP, SOB, Dizziness, or headache. She had an episode where she woke in the middle of the night last night and felt she couldn't breath for a few seconds but nothing since.  Decreased the dose back down to 0.1mg  clonidine  when she returned home on Saturday and the swelling has gone down some. Elevating her feet when she can.  Appt  with PCP office tomorrow 11/18. ED/UC/Callback instructions understood. Reach out with any recommendations prior to appt.   1. LOCATION: Which ankle is swollen? Where is the swelling?     Bilateral ankle swelling- Left slightly worse than right  2. ONSET: When did the swelling start?     Last week  3. SWELLING: How bad is the swelling? Or, How large is it? (e.g., mild, moderate, severe; size of localized swelling)      Ankle and feet swelling,  4. PAIN: Is there any pain? If Yes, ask: How bad is it? (Scale 0-10; or none, mild, moderate, severe)     Stiff but not really painful- uses walker to get around  5. CAUSE: What do you think caused the ankle swelling?     Increased Clonidine   6. OTHER SYMPTOMS: Do you have any other symptoms? (e.g., fever, chest pain, difficulty breathing, calf pain)     HTN  Protocols used: Ankle Swelling-A-AH

## 2024-07-05 ENCOUNTER — Ambulatory Visit: Admitting: Nurse Practitioner

## 2024-07-05 ENCOUNTER — Encounter: Payer: Self-pay | Admitting: Nurse Practitioner

## 2024-07-05 VITALS — BP 146/86 | HR 68 | Temp 98.0°F | Ht 66.0 in | Wt 145.0 lb

## 2024-07-05 DIAGNOSIS — I10 Essential (primary) hypertension: Secondary | ICD-10-CM

## 2024-07-05 DIAGNOSIS — R600 Localized edema: Secondary | ICD-10-CM | POA: Diagnosis not present

## 2024-07-05 MED ORDER — NEBIVOLOL HCL 5 MG PO TABS: 5.0000 mg | ORAL_TABLET | Freq: Every day | ORAL | 1 refills | Status: DC

## 2024-07-05 MED ORDER — CLONIDINE HCL 0.1 MG PO TABS: 0.1000 mg | ORAL_TABLET | Freq: Three times a day (TID) | ORAL | 1 refills | Status: DC

## 2024-07-05 NOTE — Patient Instructions (Signed)
 Peripheral Edema  Peripheral edema is swelling that is caused by a buildup of fluid. Peripheral edema most often affects the lower legs, ankles, and feet. It can also develop in the arms, hands, and face. The area of the body that has peripheral edema will look swollen. It may also feel heavy or warm. Your clothes may start to feel tight. Pressing on the area may make a temporary dent in your skin (pitting edema). You may not be able to move your swollen arm or leg as much as usual. There are many causes of peripheral edema. It can happen because of a complication of other conditions such as heart failure, kidney disease, or a problem with your circulation. It also can be a side effect of certain medicines or happen because of an infection. It often happens to women during pregnancy. Sometimes, the cause is not known. Follow these instructions at home: Managing pain, stiffness, and swelling  Raise (elevate) your legs while you are sitting or lying down. Move around often to prevent stiffness and to reduce swelling. Do not sit or stand for long periods of time. Do not wear tight clothing. Do not wear garters on your upper legs. Exercise your legs to get your circulation going. This helps to move the fluid back into your blood vessels, and it may help the swelling go down. Wear compression stockings as told by your health care provider. These stockings help to prevent blood clots and reduce swelling in your legs. It is important that these are the correct size. These stockings should be prescribed by your doctor to prevent possible injuries. If elastic bandages or wraps are recommended, use them as told by your health care provider. Medicines Take over-the-counter and prescription medicines only as told by your health care provider. Your health care provider may prescribe medicine to help your body get rid of excess water (diuretic). Take this medicine if you are told to take it. General  instructions Eat a low-salt (low-sodium) diet as told by your health care provider. Sometimes, eating less salt may reduce swelling. Pay attention to any changes in your symptoms. Moisturize your skin daily to help prevent skin from cracking and draining. Keep all follow-up visits. This is important. Contact a health care provider if: You have a fever. You have swelling in only one leg. You have increased swelling, redness, or pain in one or both of your legs. You have drainage or sores at the area where you have edema. Get help right away if: You have edema that starts suddenly or is getting worse, especially if you are pregnant or have a medical condition. You develop shortness of breath, especially when you are lying down. You have pain in your chest or abdomen. You feel weak. You feel like you will faint. These symptoms may be an emergency. Get help right away. Call 911. Do not wait to see if the symptoms will go away. Do not drive yourself to the hospital. Summary Peripheral edema is swelling that is caused by a buildup of fluid. Peripheral edema most often affects the lower legs, ankles, and feet. Move around often to prevent stiffness and to reduce swelling. Do not sit or stand for long periods of time. Pay attention to any changes in your symptoms. Contact a health care provider if you have edema that starts suddenly or is getting worse, especially if you are pregnant or have a medical condition. Get help right away if you develop shortness of breath, especially when lying down.  This information is not intended to replace advice given to you by your health care provider. Make sure you discuss any questions you have with your health care provider. Document Revised: 04/08/2021 Document Reviewed: 04/08/2021 Elsevier Patient Education  2024 [employer].

## 2024-07-05 NOTE — Progress Notes (Signed)
 Subjective:    Patient ID: Katie [name], female    DOB: 05-Aug-1932, 89 y.o.   MRN: [id number]   Chief Complaint: ankles and feet swelling (Thinks its coming from the clonidine  so she has stopped)   HPI  Patient comes in c/o lower extremity swelling. She thought it was coming from increase dose of clonidine . Swelling has gotten better since she decreased her clonidine  to 0.2mg . Still has some swelling during the day that resolves at night. Patient Active Problem List   Diagnosis Date Noted   Spinal stenosis of lumbar region 04/22/2023   Breakage of internal fixation device in bone 11/22/2019   Displaced intertrochanteric fracture of left femur, initial encounter for closed fracture (HCC) 09/22/2019   Hypomagnesemia 08/12/2019   Regurgitation of food 07/20/2019   Chronic midline low back pain without sciatica 10/22/2018   Primary insomnia 10/22/2018   Persistent atrial fibrillation (HCC) 04/27/2017   Chronic diastolic CHF (congestive heart failure) (HCC) 04/27/2017   CKD (chronic kidney disease) stage 3, GFR 30-59 ml/min (HCC) 05/14/2015   BMI 26.0-26.9,adult 05/11/2015   Peripheral edema 07/06/2014   Hypokalemia 07/22/2013   Hypothyroidism 07/22/2013   Osteopenia, senile 03/09/2013   Constipation 01/04/2013   Hypertension 05/07/2012   Hyperlipidemia associated with type 2 diabetes mellitus (HCC) 05/07/2012   Diabetes mellitus type 2, diet-controlled (HCC) 05/07/2012   GERD (gastroesophageal reflux disease) 05/07/2012       Review of Systems  Constitutional:  Negative for diaphoresis.  Eyes:  Negative for pain.  Respiratory:  Negative for shortness of breath.   Cardiovascular:  Negative for chest pain, palpitations and leg swelling.  Gastrointestinal:  Negative for abdominal pain.  Endocrine: Negative for polydipsia.  Skin:  Negative for rash.  Neurological:  Negative for dizziness, weakness and headaches.  Hematological:  Does not bruise/bleed easily.  All other  systems reviewed and are negative.      Objective:   Physical Exam Constitutional:      Appearance: Normal appearance.  Cardiovascular:     Rate and Rhythm: Normal rate and regular rhythm.     Heart sounds: Normal heart sounds.  Pulmonary:     Breath sounds: Normal breath sounds.  Skin:    General: Skin is warm.  Neurological:     General: No focal deficit present.     Mental Status: She is alert and oriented to person, place, and time.  Psychiatric:        Mood and Affect: Mood normal.        Behavior: Behavior normal.    BP (!) 146/86   Pulse 68   Temp 98 F (36.7 C) (Temporal)   Ht 5' 6 (1.676 m)   Wt 145 lb (65.8 kg)   SpO2 95%   BMI 23.40 kg/m         Assessment & Plan:  Levorn VEAR Collum in today with chief complaint of ankles and feet swelling (Thinks its coming from the clonidine  so she has stopped)   1. Primary hypertension (Primary) Low sodium diet - nebivolol (BYSTOLIC) 5 MG tablet; Take 1 tablet (5 mg total) by mouth daily.  Dispense: 90 tablet; Refill: 1 - cloNIDine  (CATAPRES ) 0.1 MG tablet; Take 1 tablet (0.1 mg total) by mouth 3 (three) times daily.  Dispense: 90 tablet; Refill: 1 - Brain natriuretic peptide - BMP8+EGFR  2. Peripheral edema Elevate legs while sitting Continue lasix  as prescribed Compression socks    The above assessment and management plan was discussed with the patient. The  patient verbalized understanding of and has agreed to the management plan. Patient is aware to call the clinic if symptoms persist or worsen. Patient is aware when to return to the clinic for a follow-up visit. Patient educated on when it is appropriate to go to the emergency department.   Mary-Margaret Gladis, FNP

## 2024-07-06 LAB — BRAIN NATRIURETIC PEPTIDE: BNP: 340.3 pg/mL — ABNORMAL HIGH (ref 0.0–100.0)

## 2024-07-06 LAB — BMP8+EGFR
BUN/Creatinine Ratio: 10 — ABNORMAL LOW (ref 12–28)
BUN: 13 mg/dL (ref 10–36)
CO2: 24 mmol/L (ref 20–29)
Calcium: 9.5 mg/dL (ref 8.7–10.3)
Chloride: 100 mmol/L (ref 96–106)
Creatinine, Ser: 1.24 mg/dL — ABNORMAL HIGH (ref 0.57–1.00)
Glucose: 130 mg/dL — ABNORMAL HIGH (ref 70–99)
Potassium: 3.8 mmol/L (ref 3.5–5.2)
Sodium: 140 mmol/L (ref 134–144)
eGFR: 41 mL/min/1.73 — ABNORMAL LOW (ref >59)

## 2024-07-07 ENCOUNTER — Ambulatory Visit: Payer: Self-pay | Admitting: Nurse Practitioner

## 2024-07-07 NOTE — Telephone Encounter (Signed)
CALLED PATIENT, NO ANSWER, LEFT MESSAGE TO RETURN CALL 

## 2024-07-11 ENCOUNTER — Other Ambulatory Visit: Payer: Self-pay | Admitting: Nurse Practitioner

## 2024-07-11 DIAGNOSIS — Z1231 Encounter for screening mammogram for malignant neoplasm of breast: Secondary | ICD-10-CM

## 2024-07-13 ENCOUNTER — Ambulatory Visit
Admission: RE | Admit: 2024-07-13 | Discharge: 2024-07-13 | Disposition: A | Discharge status: Home / Self Care | Source: Ambulatory Visit

## 2024-07-13 DIAGNOSIS — Z1231 Encounter for screening mammogram for malignant neoplasm of breast: Secondary | ICD-10-CM

## 2024-07-22 ENCOUNTER — Other Ambulatory Visit: Payer: Self-pay | Admitting: Nurse Practitioner

## 2024-07-22 DIAGNOSIS — R928 Other abnormal and inconclusive findings on diagnostic imaging of breast: Secondary | ICD-10-CM

## 2024-08-04 ENCOUNTER — Encounter

## 2024-08-04 ENCOUNTER — Other Ambulatory Visit

## 2024-08-12 ENCOUNTER — Ambulatory Visit (INDEPENDENT_AMBULATORY_CARE_PROVIDER_SITE_OTHER): Payer: Self-pay | Admitting: Nurse Practitioner

## 2024-08-12 ENCOUNTER — Encounter: Payer: Self-pay | Admitting: Nurse Practitioner

## 2024-08-12 VITALS — BP 198/63 | HR 56 | Temp 97.6°F | Ht 66.0 in | Wt 142.0 lb

## 2024-08-12 DIAGNOSIS — I10 Essential (primary) hypertension: Secondary | ICD-10-CM | POA: Diagnosis not present

## 2024-08-12 DIAGNOSIS — M545 Low back pain, unspecified: Secondary | ICD-10-CM | POA: Diagnosis not present

## 2024-08-12 DIAGNOSIS — G8929 Other chronic pain: Secondary | ICD-10-CM

## 2024-08-12 DIAGNOSIS — F1120 Opioid dependence, uncomplicated: Secondary | ICD-10-CM | POA: Insufficient documentation

## 2024-08-12 MED ORDER — CLONIDINE HCL 0.2 MG PO TABS: 0.2000 mg | ORAL_TABLET | Freq: Two times a day (BID) | ORAL | 1 refills | Status: AC

## 2024-08-12 MED ORDER — TRAMADOL HCL 50 MG PO TABS: 50.0000 mg | ORAL_TABLET | Freq: Three times a day (TID) | ORAL | 2 refills | Status: AC | PRN

## 2024-08-12 NOTE — Progress Notes (Signed)
 "  Subjective:    Patient ID: Katie [name], female    DOB: 03-06-1932, 89 y.o.   MRN: [id number]   Chief Complaint: chronic back pain  1.chronic back pain 2. Opoid dependence  Pain assessment: Cause of pain- DDD Pain location- lower back Pain on scale of 1-10- 10/10 currently Frequency- daily What increases pain-to much activity What makes pain Better-rest helps Effects on ADL - none Any change in general medical condition-none  Current opioids rx- ultram  50 # meds rx- 62 Effectiveness of current meds-helps Adverse reactions from pain meds-none Morphine  equivalent-  Pill count performed-Yes Last drug screen - 6/1/423 ( high risk q21m, moderate risk q49m, low risk yearly ) Urine drug screen today- No Was the [facility] reviewed- yes  If yes were their any concerning findings? - no   Overdose risk: 1    Pain contract signed on: 05/29/23   3. Hypertension Patient was started on bystolic  at last visit and she said it made her dizzy so she stopped taking. Is now on clonidine  0.1 in morning and 0.2 at night    Review of Systems  Constitutional:  Negative for diaphoresis.  Eyes:  Negative for pain.  Respiratory:  Negative for shortness of breath.   Cardiovascular:  Negative for chest pain, palpitations and leg swelling.  Gastrointestinal:  Negative for abdominal pain.  Endocrine: Negative for polydipsia.  Skin:  Negative for rash.  Neurological:  Negative for dizziness, weakness and headaches.  Hematological:  Does not bruise/bleed easily.  All other systems reviewed and are negative.      Objective:   Physical Exam Vitals and nursing note reviewed.  Constitutional:      General: She is not in acute distress.    Appearance: Normal appearance. She is well-developed.  HENT:     Head: Normocephalic.     Right Ear: Tympanic membrane normal.     Left Ear: Tympanic membrane normal.     Nose: Nose normal.     Mouth/Throat:     Mouth: Mucous membranes are  moist.  Eyes:     Pupils: Pupils are equal, round, and reactive to light.  Neck:     Vascular: No carotid bruit or JVD.  Cardiovascular:     Rate and Rhythm: Normal rate and regular rhythm.     Heart sounds: Normal heart sounds.  Pulmonary:     Effort: Pulmonary effort is normal. No respiratory distress.     Breath sounds: Normal breath sounds. No wheezing or rales.  Chest:     Chest wall: No tenderness.  Abdominal:     General: Bowel sounds are normal. There is no distension or abdominal bruit.     Palpations: Abdomen is soft. There is no hepatomegaly, splenomegaly, mass or pulsatile mass.     Tenderness: There is no abdominal tenderness.  Musculoskeletal:        General: Normal range of motion.     Cervical back: Normal range of motion and neck supple.  Lymphadenopathy:     Cervical: No cervical adenopathy.  Skin:    General: Skin is warm and dry.  Neurological:     Mental Status: She is alert and oriented to person, place, and time.     Deep Tendon Reflexes: Reflexes are normal and symmetric.  Psychiatric:        Behavior: Behavior normal.        Thought Content: Thought content normal.        Judgment: Judgment normal.  BP (!) 198/63   Pulse (!) 56   Temp 97.6 F (36.4 C) (Temporal)   Ht 5' 6 (1.676 m)   Wt 142 lb (64.4 kg)   SpO2 98%   BMI 22.92 kg/m            Assessment & Plan:   ZEHAVA TURSKI in today with chief complaint of Pain Management   1. Chronic midline low back pain without sciatica (Primary) Moist heat rest - traMADol  (ULTRAM ) 50 MG tablet; Take 1 tablet (50 mg total) by mouth 3 (three) times daily as needed.  Dispense: 90 tablet; Refill: 2  2. Uncomplicated opioid dependence (HCC) - traMADol  (ULTRAM ) 50 MG tablet; Take 1 tablet (50 mg total) by mouth 3 (three) times daily as needed.  Dispense: 90 tablet; Refill: 2  3. Primary hypertension Dash diet Increase clonoidine to 0.2mg  BID Keep diary of blood pressure at home If not  improving NTBS - cloNIDine  (CATAPRES ) 0.2 MG tablet; Take 1 tablet (0.2 mg total) by mouth 2 (two) times daily.  Dispense: 180 tablet; Refill: 1    The above assessment and management plan was discussed with the patient. The patient verbalized understanding of and has agreed to the management plan. Patient is aware to call the clinic if symptoms persist or worsen. Patient is aware when to return to the clinic for a follow-up visit. Patient educated on when it is appropriate to go to the emergency department.   Mary-Margaret Gladis, FNP    "

## 2024-08-29 ENCOUNTER — Ambulatory Visit

## 2024-08-29 ENCOUNTER — Ambulatory Visit
Admission: RE | Admit: 2024-08-29 | Discharge: 2024-08-29 | Disposition: A | Source: Ambulatory Visit | Attending: Nurse Practitioner | Admitting: Nurse Practitioner

## 2024-08-29 DIAGNOSIS — R928 Other abnormal and inconclusive findings on diagnostic imaging of breast: Secondary | ICD-10-CM

## 2024-09-12 ENCOUNTER — Ambulatory Visit: Admitting: Nurse Practitioner

## 2024-09-15 ENCOUNTER — Ambulatory Visit: Admitting: Nurse Practitioner

## 2024-09-20 ENCOUNTER — Ambulatory Visit: Admitting: Nurse Practitioner
